# Patient Record
Sex: Female | Born: 1960 | Race: Black or African American | Hispanic: No | Marital: Married | Smoking: Former smoker
Health system: Southern US, Community
[De-identification: ages and names within clinical notes are randomized; demographics above are authoritative.]

## PROBLEM LIST (undated history)

## (undated) DIAGNOSIS — Z923 Personal history of irradiation: Secondary | ICD-10-CM

## (undated) DIAGNOSIS — I2510 Atherosclerotic heart disease of native coronary artery without angina pectoris: Secondary | ICD-10-CM

## (undated) DIAGNOSIS — I255 Ischemic cardiomyopathy: Secondary | ICD-10-CM

## (undated) DIAGNOSIS — C539 Malignant neoplasm of cervix uteri, unspecified: Secondary | ICD-10-CM

## (undated) DIAGNOSIS — Z95810 Presence of automatic (implantable) cardiac defibrillator: Secondary | ICD-10-CM

## (undated) DIAGNOSIS — I5042 Chronic combined systolic (congestive) and diastolic (congestive) heart failure: Secondary | ICD-10-CM

## (undated) DIAGNOSIS — M1990 Unspecified osteoarthritis, unspecified site: Secondary | ICD-10-CM

## (undated) DIAGNOSIS — I059 Rheumatic mitral valve disease, unspecified: Secondary | ICD-10-CM

## (undated) DIAGNOSIS — R918 Other nonspecific abnormal finding of lung field: Secondary | ICD-10-CM

## (undated) DIAGNOSIS — I10 Essential (primary) hypertension: Secondary | ICD-10-CM

## (undated) DIAGNOSIS — I2109 ST elevation (STEMI) myocardial infarction involving other coronary artery of anterior wall: Secondary | ICD-10-CM

## (undated) DIAGNOSIS — C3411 Malignant neoplasm of upper lobe, right bronchus or lung: Secondary | ICD-10-CM

## (undated) HISTORY — DX: Unspecified osteoarthritis, unspecified site: M19.90

## (undated) HISTORY — DX: Chronic combined systolic (congestive) and diastolic (congestive) heart failure: I50.42

## (undated) HISTORY — DX: Essential (primary) hypertension: I10

## (undated) HISTORY — DX: ST elevation (STEMI) myocardial infarction involving other coronary artery of anterior wall: I21.09

## (undated) HISTORY — DX: Malignant neoplasm of cervix uteri, unspecified: C53.9

## (undated) HISTORY — PX: BREAST EXCISIONAL BIOPSY: SUR124

## (undated) HISTORY — PX: PORTA CATH INSERTION: CATH118285

## (undated) HISTORY — PX: VALVE REPLACEMENT: SUR13

## (undated) HISTORY — DX: Rheumatic mitral valve disease, unspecified: I05.9

## (undated) HISTORY — DX: Ischemic cardiomyopathy: I25.5

## (undated) HISTORY — DX: Atherosclerotic heart disease of native coronary artery without angina pectoris: I25.10

## (undated) HISTORY — DX: Other nonspecific abnormal finding of lung field: R91.8

## (undated) HISTORY — PX: CARDIAC DEFIBRILLATOR PLACEMENT: SHX171

## (undated) HISTORY — PX: SHOULDER SURGERY: SHX246

## (undated) HISTORY — PX: REPLACEMENT TOTAL KNEE: SUR1224

## (undated) HISTORY — PX: CARDIAC CATHETERIZATION: SHX172

---

## 1996-08-30 HISTORY — PX: APPENDECTOMY: SHX54

## 1996-08-30 HISTORY — PX: RADICAL HYSTERECTOMY: SHX2283

## 2016-12-06 ENCOUNTER — Encounter: Payer: Self-pay | Admitting: *Deleted

## 2016-12-06 ENCOUNTER — Other Ambulatory Visit: Payer: Self-pay | Admitting: *Deleted

## 2016-12-07 ENCOUNTER — Ambulatory Visit (INDEPENDENT_AMBULATORY_CARE_PROVIDER_SITE_OTHER): Payer: BLUE CROSS/BLUE SHIELD | Admitting: Internal Medicine

## 2016-12-07 ENCOUNTER — Encounter: Payer: Self-pay | Admitting: Internal Medicine

## 2016-12-07 VITALS — BP 116/60 | HR 72 | Ht 62.0 in | Wt 215.8 lb

## 2016-12-07 DIAGNOSIS — Z95810 Presence of automatic (implantable) cardiac defibrillator: Secondary | ICD-10-CM

## 2016-12-07 DIAGNOSIS — I5022 Chronic systolic (congestive) heart failure: Secondary | ICD-10-CM

## 2016-12-07 DIAGNOSIS — I429 Cardiomyopathy, unspecified: Secondary | ICD-10-CM | POA: Diagnosis not present

## 2016-12-07 LAB — CUP PACEART INCLINIC DEVICE CHECK
Battery Voltage: 3.03 V
Brady Statistic RV Percent Paced: 0 %
Date Time Interrogation Session: 20180410152911
HighPow Impedance: 81 Ohm
Implantable Lead Location: 753860
Implantable Pulse Generator Implant Date: 20160802
Lead Channel Impedance Value: 323 Ohm
Lead Channel Impedance Value: 399 Ohm
Lead Channel Pacing Threshold Pulse Width: 0.4 ms
Lead Channel Setting Pacing Amplitude: 2 V
MDC IDC LEAD IMPLANT DT: 20160802
MDC IDC MSMT BATTERY REMAINING LONGEVITY: 128 mo
MDC IDC MSMT LEADCHNL RV PACING THRESHOLD AMPLITUDE: 0.75 V
MDC IDC MSMT LEADCHNL RV SENSING INTR AMPL: 13.625 mV
MDC IDC SET LEADCHNL RV PACING PULSEWIDTH: 0.4 ms
MDC IDC SET LEADCHNL RV SENSING SENSITIVITY: 0.3 mV

## 2016-12-07 MED ORDER — SACUBITRIL-VALSARTAN 24-26 MG PO TABS: 1.0000 | ORAL_TABLET | Freq: Two times a day (BID) | ORAL | 6 refills | Status: DC

## 2016-12-07 MED ORDER — SACUBITRIL-VALSARTAN 24-26 MG PO TABS: 1.0000 | ORAL_TABLET | Freq: Two times a day (BID) | ORAL | 0 refills | Status: DC

## 2016-12-07 MED ORDER — SACUBITRIL-VALSARTAN 24-26 MG PO TABS: 1.0000 | ORAL_TABLET | Freq: Two times a day (BID) | ORAL | Status: DC

## 2016-12-07 MED ORDER — METOPROLOL SUCCINATE ER 25 MG PO TB24: 25.0000 mg | ORAL_TABLET | Freq: Every day | ORAL | 3 refills | Status: DC

## 2016-12-07 NOTE — Patient Instructions (Signed)
Medication Instructions: - Your physician has recommended you make the following change in your medication:  1) Decrease toprol (metoprolol succinate) 50 mg- take 1/2 tablet (25 mg) by mouth once daily 2) Decrease aldactone (spironolactone) 25 mg- take 1/2 tablet (12.5 mg) by mouth once daily 3) Start entresto 24/26 mg- take one tablet by mouth twice daily  Labwork: - none ordered  Procedures/Testing: - .none ordered  Follow-Up: - You have been referred to : Darylene Price, NP in the CHF clinic  - Your physician recommends that you schedule a follow-up appointment in: 3 months with Dr. Saunders Revel  - Remote monitoring is used to monitor your Pacemaker of ICD from home. This monitoring reduces the number of office visits required to check your device to one time per year. It allows [place] to keep an eye on the functioning of your device to ensure it is working properly. You are scheduled for a device check from home on 03/08/17. You may send your transmission at any time that day. If you have a wireless device, the transmission will be sent automatically. After your physician reviews your transmission, you will receive a postcard with your next transmission date.  - Your physician wants you to follow-up in: 6 months with Dr. Caryl Comes. You will receive a reminder letter in the mail two months in advance. If you don't receive a letter, please call our office to schedule the follow-up appointment.   Any Additional Special Instructions Will Be Listed Below (If Applicable).     If you need a refill on your cardiac medications before your next appointment, please call your pharmacy.

## 2016-12-07 NOTE — Progress Notes (Signed)
ELECTROPHYSIOLOGY CONSULT NOTE  Patient ID: Vanessa Carlson, MRN: [id number], DOB/AGE: [birth year] 56 y.o. Admit date: (Not on file) Date of Consult: 12/07/2016  Primary Physician: [facility] Primary Cardiologist: new   [name] is being seen today for the evaluation of ICD  And cardiac careat the request of  [place]*.   HPI Vanessa Carlson is a 56 y.o. female  Seen to establish ICD follow-up.  She has a complicated cardiac history. 2006 she underwent mitral valve replacement-bioprosthetic.3/16 she had an anterior wall MI; they were unable to open the LAD at the [facility] and she suffered cardiogenic shock and pericarditis.  she received an ICD Medtronic  DATE TEST    3/16    echo   EF 35 %   3/17    echo   EF 25 %          SHe has had no ICD shocks. She's had no syncope.  She has some peripheral edema. She has no nocturnal dyspnea or orthopnea. She has mild dyspnea on exertion but is able to climb a flight of stairs.    Past Medical History:  Diagnosis Date  . Cervical cancer ([place])   . Chronic atrial fibrillation ([place])   . Coronary artery disease   . Essential hypertension   . MI (myocardial infarction)       Surgical History:  Past Surgical History:  Procedure Laterality Date  . CARDIAC CATHETERIZATION    . CARDIAC DEFIBRILLATOR PLACEMENT    . CORONARY ARTERY BYPASS GRAFT    . RADICAL HYSTERECTOMY    . SHOULDER SURGERY Bilateral   . VALVE REPLACEMENT Left      Home Meds: Prior to Admission medications   Medication Sig Start Date End Date Taking? Authorizing Provider  aspirin 81 MG chewable tablet Chew 81 mg by mouth daily.   Yes Historical Provider, MD  atorvastatin (LIPITOR) 80 MG tablet Take 80 mg by mouth daily.   Yes Historical Provider, MD  fluticasone (FLONASE) 50 MCG/ACT nasal spray Place 2 sprays into both nostrils as needed for allergies or rhinitis.   Yes Historical Provider, MD    furosemide (LASIX) 40 MG tablet Take 40 mg by mouth daily.   Yes Historical Provider, MD  lisinopril (PRINIVIL,ZESTRIL) 2.5 MG tablet Take 2.5 mg by mouth daily.   Yes Historical Provider, MD  metoprolol succinate (TOPROL-XL) 50 MG 24 hr tablet Take 50 mg by mouth daily. Take with or immediately following a meal.   Yes Historical Provider, MD  spironolactone (ALDACTONE) 25 MG tablet Take 25 mg by mouth daily.   Yes Historical Provider, MD  traMADol (ULTRAM) 50 MG tablet Take 50 mg by mouth every 6 (six) hours as needed.   Yes Historical Provider, MD  warfarin (COUMADIN) 1 MG tablet as directed.    Yes Historical Provider, MD  warfarin (COUMADIN) 2.5 MG tablet Take 2 by mouth Wed, Thurs, Sat, Mon. + 0.5 mg On Tues, Fri, Sun.   Yes Historical Provider, MD    Allergies:  Allergies  Allergen Reactions  . No Allergies On File     Social History   Social History  . Marital status: Married    Spouse name: N/A  . Number of children: N/A  . Years of education: N/A   Occupational History  . Not on file.   Social History Main Topics  . Smoking status: Never Smoker  . Smokeless tobacco: Never Used  . Alcohol use No  .  Drug use: No  . Sexual activity: Not on file   Other Topics Concern  . Not on file   Social History Narrative  . No narrative on file     History reviewed. No pertinent family history.   ROS:  Please see the history of present illness.     All other systems reviewed and negative.    Physical Exam: Blood pressure 116/60, pulse 72, height '5\' 2"'$  (1.575 m), weight 215 lb 12 oz (97.9 kg). General: Well developed, well nourished female in no acute distress. Head: Normocephalic, atraumatic, sclera non-icteric, no xanthomas, nares are without discharge. EENT: normal  Lymph Nodes:  none Neck: Negative for carotid bruits. JVD 8 Device pocket well healed; without hematoma or erythema.  There is no tethering  Back:without scoliosis kyphosis Lungs: Clear bilaterally to  auscultation without wheezes, rales, or rhonchi. Breathing is unlabored. Heart: RRR with S1 S2. No  /6 systolic murmur . No rubs, or gallops appreciated. Abdomen: Soft, non-tender, non-distended with normoactive bowel sounds. No hepatomegaly. No rebound/guarding. No obvious abdominal masses. Msk:  Strength and tone appear normal for age. Extremities: No clubbing or cyanosis NO  edema.  Distal pedal pulses are 2+ and equal bilaterally. Skin: Warm and Dry Neuro: Alert and oriented X 3. CN III-XII intact Grossly normal sensory and motor function . Psych:  Responds to questions appropriately with a normal affect.      Labs: Cardiac Enzymes No results for input(s): CKTOTAL, CKMB, TROPONINI in the last 72 hours. CBC No results found for: WBC, HGB, HCT, MCV, PLT PROTIME: No results for input(s): LABPROT, INR in the last 72 hours. Chemistry No results for input(s): NA, K, CL, CO2, BUN, CREATININE, CALCIUM, PROT, BILITOT, ALKPHOS, ALT, AST, GLUCOSE in the last 168 hours.  Invalid input(s): LABALBU Lipids No results found for: CHOL, HDL, LDLCALC, TRIG BNP No results found for: PROBNP Thyroid Function Tests: No results for input(s): TSH, T4TOTAL, T3FREE, THYROIDAB in the last 72 hours.  Invalid input(s): FREET3 Miscellaneous No results found for: DDIMER  Radiology/Studies:  No results found.  WNU:UVOZD rhythm at 72 Intervals 16/08/39   Assessment and Plan:  Ischemic cardiomyopathy status post anterior wall MI  Mitral valve replacement-mechanical  Implantable defibrillator-Medtronic  Congestive heart failure-class IIb   Patient has congestive heart failure in the context of her ischemic cardiomyopathy. The doctors at [place] had raised the possibility of switching to [place]. We'll try to do that today. We will decrease her Aldactone from 25--12.5 metoprolol from 50--25 in the hopes of being able to have sufficient blood pressure to initiate Entresto 20/26.  We will arrange  follow-up in the heart failure clinic  He is currently taking her diuretics 40 mg 2-3 times a day without significant diuresis. I suggested that she try 80 once a day and if augmented diuretics are necessary to take 120 mg once a day or 80 mg twice a day  I will arrange establishing with Dr. Saunders Revel for general cardiology.  We have reviewed ICD protocols and we will arrange for the transmission of her [facility] to [place]      Virl Axe

## 2016-12-07 NOTE — Progress Notes (Signed)
Referred to ICM clinic by Levander Campion, Device RN during office visit with Dr Caryl Comes.  ICM intro given and patient agreed to monthly ICM follow up.  She reported having some ankle swelling today and will discuss with Dr Caryl Comes.  She takes extra Furosemide when needed.  1st ICM remote transmission scheduled for 12/21/2016.  Provided ICM direct number given and encouraged to call for fluid symptoms.

## 2016-12-22 ENCOUNTER — Telehealth: Payer: Self-pay | Admitting: Cardiology

## 2016-12-22 NOTE — Telephone Encounter (Signed)
Spoke with pt and reminded pt of remote transmission that is due today. Pt verbalized understanding.   

## 2016-12-24 ENCOUNTER — Telehealth: Payer: Self-pay

## 2016-12-24 NOTE — Telephone Encounter (Signed)
Spoke with Vanessa Carlson in device clinic at [place] at [place] and requested patient be released in [place] since [facility] device clinic will be following her.  She released her and now visible in [facility].

## 2016-12-24 NOTE — Telephone Encounter (Signed)
ICM call to patient.  Advised I scheduled 1st ICM remote transmission for 01/06/2017 and she was just released to [place] from [place] at [place] so we can follow her in the device clinic.  She stated she is doing well at this time.  She works and sometimes has swollen feet but they usually go down at night after resting.  Confirmed she has ICM direct number and encouraged to call if needed.

## 2016-12-31 ENCOUNTER — Encounter: Payer: Self-pay | Admitting: Family

## 2016-12-31 ENCOUNTER — Ambulatory Visit: Payer: BLUE CROSS/BLUE SHIELD | Attending: Family | Admitting: Family

## 2016-12-31 VITALS — BP 126/60 | HR 82 | Resp 20 | Ht 62.0 in | Wt 210.5 lb

## 2016-12-31 DIAGNOSIS — I5022 Chronic systolic (congestive) heart failure: Secondary | ICD-10-CM | POA: Insufficient documentation

## 2016-12-31 DIAGNOSIS — I480 Paroxysmal atrial fibrillation: Secondary | ICD-10-CM

## 2016-12-31 DIAGNOSIS — I10 Essential (primary) hypertension: Secondary | ICD-10-CM

## 2016-12-31 DIAGNOSIS — I482 Chronic atrial fibrillation: Secondary | ICD-10-CM | POA: Diagnosis not present

## 2016-12-31 DIAGNOSIS — I2510 Atherosclerotic heart disease of native coronary artery without angina pectoris: Secondary | ICD-10-CM | POA: Diagnosis not present

## 2016-12-31 DIAGNOSIS — I252 Old myocardial infarction: Secondary | ICD-10-CM | POA: Diagnosis not present

## 2016-12-31 DIAGNOSIS — Z7901 Long term (current) use of anticoagulants: Secondary | ICD-10-CM | POA: Diagnosis not present

## 2016-12-31 DIAGNOSIS — Z95810 Presence of automatic (implantable) cardiac defibrillator: Secondary | ICD-10-CM | POA: Insufficient documentation

## 2016-12-31 DIAGNOSIS — Z952 Presence of prosthetic heart valve: Secondary | ICD-10-CM | POA: Insufficient documentation

## 2016-12-31 DIAGNOSIS — Z951 Presence of aortocoronary bypass graft: Secondary | ICD-10-CM | POA: Diagnosis not present

## 2016-12-31 DIAGNOSIS — I110 Hypertensive heart disease with heart failure: Secondary | ICD-10-CM | POA: Insufficient documentation

## 2016-12-31 DIAGNOSIS — Z7982 Long term (current) use of aspirin: Secondary | ICD-10-CM | POA: Insufficient documentation

## 2016-12-31 DIAGNOSIS — Z8541 Personal history of malignant neoplasm of cervix uteri: Secondary | ICD-10-CM | POA: Insufficient documentation

## 2016-12-31 LAB — BASIC METABOLIC PANEL
Anion gap: 11 (ref 5–15)
BUN: 11 mg/dL (ref 6–20)
CHLORIDE: 103 mmol/L (ref 101–111)
CO2: 28 mmol/L (ref 22–32)
CREATININE: 0.88 mg/dL (ref 0.44–1.00)
Calcium: 9.2 mg/dL (ref 8.9–10.3)
GFR calc non Af Amer: >60 mL/min (ref >60)
Glucose, Bld: 111 mg/dL — ABNORMAL HIGH (ref 65–99)
POTASSIUM: 3.2 mmol/L — AB (ref 3.5–5.1)
Sodium: 142 mmol/L (ref 135–145)

## 2016-12-31 NOTE — Patient Instructions (Addendum)
Continue weighing daily and call for an overnight weight gain of > 2 pounds or a weekly weight gain of >5 pounds.  Maintain fluid intake of 40-50 oz per day.

## 2016-12-31 NOTE — Progress Notes (Signed)
Patient ID: Vanessa Carlson, female    DOB: 17-Jun-1961, 56 y.o.   MRN: [id number]  HPI  [name] is a 56 y/o female with a history of cervical cancer, atrial fibrillation, CAD, HTN, MI, AICD and chronic heart failure.   Reviewed last echo report done on 11/20/15 which showed an EF of 20-25%.  Was admitted in 2006 to [facility] for mitral valve replacement. Admitted March 2016 due to anterior wall MI and received ICD.   She presents today for her initial visit with a chief complaint of mild shortness of breath with moderate exertion. She describes this as chronic in nature and has been present for a few years. Improves with rest. She has associated fatigue and edema with this.   Past Medical History:  Diagnosis Date  . Cervical cancer ([place])   . CHF (congestive heart failure) ([place])   . Chronic atrial fibrillation ([place])   . Coronary artery disease   . Essential hypertension   . MI (myocardial infarction) [facility])    Past Surgical History:  Procedure Laterality Date  . CARDIAC CATHETERIZATION    . CARDIAC DEFIBRILLATOR PLACEMENT    . CORONARY ARTERY BYPASS GRAFT    . RADICAL HYSTERECTOMY    . SHOULDER SURGERY Bilateral   . VALVE REPLACEMENT Left    No family history on file. Social History  Substance Use Topics  . Smoking status: Never Smoker  . Smokeless tobacco: Never Used  . Alcohol use No   Allergies  Allergen Reactions  . No Allergies On File    Prior to Admission medications   Medication Sig Start Date End Date Taking? Authorizing Provider  aspirin 81 MG chewable tablet Chew 81 mg by mouth daily.   Yes [provider]  atorvastatin (LIPITOR) 80 MG tablet Take 80 mg by mouth daily.   Yes [provider]  fluticasone (FLONASE) 50 MCG/ACT nasal spray Place 2 sprays into both nostrils as needed for allergies or rhinitis.   Yes [provider]  furosemide (LASIX) 40 MG tablet Take 40 mg by mouth 3 (three) times daily.    Yes [provider]   metoprolol succinate (TOPROL-XL) 25 MG 24 hr tablet Take 1 tablet (25 mg total) by mouth daily. 12/07/16  Yes Deboraha Sprang, MD  sacubitril-valsartan (ENTRESTO) 24-26 MG Take 1 tablet by mouth 2 (two) times daily. 12/07/16  Yes Deboraha Sprang, MD  spironolactone (ALDACTONE) 25 MG tablet Take 1/2 tablet (12.5 mg) by mouth once daily   Yes [provider]  traMADol (ULTRAM) 50 MG tablet Take 50 mg by mouth every 6 (six) hours as needed.   Yes [provider]  warfarin (COUMADIN) 1 MG tablet as directed.    Yes [provider]  warfarin (COUMADIN) 2.5 MG tablet Take 2 by mouth Wed, Thurs, Sat, Mon. + 0.5 mg On Tues, Fri, Sun.   Yes [provider]     Review of Systems  Constitutional: Positive for fatigue (mild). Negative for appetite change.  HENT: Negative for congestion, postnasal drip and sore throat.   Eyes: Negative.   Respiratory: Positive for shortness of breath. Negative for cough and chest tightness.   Cardiovascular: Positive for leg swelling (at the end of the work day; resolves with elevation). Negative for chest pain and palpitations.  Gastrointestinal: Negative for abdominal distention and abdominal pain.  Endocrine: Negative.   Genitourinary: Negative.   Musculoskeletal: Negative for back pain and neck pain.  Skin: Negative.   Allergic/Immunologic: Negative.  Neurological: Negative for dizziness and light-headedness.  Hematological: Negative for adenopathy. Does not bruise/bleed easily.  Psychiatric/Behavioral: Negative for dysphoric mood, sleep disturbance (sleeping on 2 pillows) and suicidal ideas. The patient is not nervous/anxious.    Vitals:   12/31/16 1053  BP: 126/60  Pulse: 82  Resp: 20  SpO2: 99%  Weight: 210 lb 8 oz (95.5 kg)  Height: '5\' 2"'$  (1.575 m)   Wt Readings from Last 3 Encounters:  12/31/16 210 lb 8 oz (95.5 kg)  12/07/16 215 lb 12 oz (97.9 kg)   Physical Exam  Constitutional: She is oriented to person,  place, and time. She appears well-developed and well-nourished.  HENT:  Head: Normocephalic and atraumatic.  Neck: Normal range of motion. Neck supple. No JVD present.  Cardiovascular: Normal rate and regular rhythm.   Pulmonary/Chest: Effort normal. She has no wheezes. She has no rales.  Abdominal: Soft. She exhibits no distension. There is no tenderness.  Musculoskeletal: She exhibits no edema or tenderness.  Neurological: She is alert and oriented to person, place, and time.  Skin: Skin is warm and dry.  Psychiatric: She has a normal mood and affect. Her behavior is normal.  Nursing note and vitals reviewed.   Assessment & Plan:  1: Chronic heart failure with reduced ejection fraction- - NYHA class II - euvolemic today - weighing daily. Instructed to call for an overnight weight gain of >2 pounds or a weekly weight gain of >5 pounds - taking furosemide '80mg'$  daily and sometimes BID - entresto has been started and tolerating without known side effects - get a BMP today - discussed trying to titrate up entresto at future visits - need updated echo at some point. May be good to wait until she has been on entresto for a few months - sees cardiologist (End) 02/23/17  2: HTN- - BP looks good - follows with [facility]  3: Atrial fibrillation- - AICD present but denies shocks - follows with EP Caryl Comes) and was last seen by him 12/07/16  Return here in 1 month or sooner for any questions/problems before then.

## 2017-01-02 DIAGNOSIS — I10 Essential (primary) hypertension: Secondary | ICD-10-CM | POA: Insufficient documentation

## 2017-01-02 DIAGNOSIS — I5022 Chronic systolic (congestive) heart failure: Secondary | ICD-10-CM | POA: Insufficient documentation

## 2017-01-02 DIAGNOSIS — I4891 Unspecified atrial fibrillation: Secondary | ICD-10-CM | POA: Insufficient documentation

## 2017-01-02 DIAGNOSIS — I5023 Acute on chronic systolic (congestive) heart failure: Secondary | ICD-10-CM | POA: Insufficient documentation

## 2017-01-03 ENCOUNTER — Telehealth: Payer: Self-pay | Admitting: Family

## 2017-01-03 NOTE — Telephone Encounter (Signed)
Spoke with patient about lab work that was drawn on 12/31/16. Renal function looks good although potassium level is slightly low at 3.2.   She is currently taking '40mg'$  furosemide AM and '80mg'$  PM, spironolactone 12.'5mg'$  daily and entresto 24/'26mg'$  twice daily.  Discussed options of adding either potassium supplement or going back up to '25mg'$  spironolactone daily. It had been decreased along with toprol xl due to hypotension.  Patient would like to increase her spironolactone back to a whole tablet (25 mg) daily as she really doesn't want to swallow a large potassium pill. She will check her blood pressure a few times a week and call if it starts to decline.  Will recheck her lab work at her next visit. Patient is comfortable with this plan.

## 2017-01-06 ENCOUNTER — Telehealth: Payer: Self-pay

## 2017-01-06 ENCOUNTER — Ambulatory Visit (INDEPENDENT_AMBULATORY_CARE_PROVIDER_SITE_OTHER): Payer: BLUE CROSS/BLUE SHIELD

## 2017-01-06 DIAGNOSIS — I5022 Chronic systolic (congestive) heart failure: Secondary | ICD-10-CM

## 2017-01-06 DIAGNOSIS — Z95810 Presence of automatic (implantable) cardiac defibrillator: Secondary | ICD-10-CM | POA: Diagnosis not present

## 2017-01-06 NOTE — Progress Notes (Signed)
EPIC Encounter for ICM Monitoring  Patient Name: Vanessa Carlson is a 56 y.o. female Date: 01/06/2017 Primary Care Physican: [facility] [place] Primary Cardiologist: Darylene Price, NP HF Electrophysiologist: Caryl Comes Dry Weight:  unknown       Attempted call to patient and unable to reach.   Transmission reviewed.    Thoracic impedance abnormal suggesting fluid accumulation but starting to trend back toward baseline.  Prescribed dosage: Furosemide 40 mg 1 tablet 3 times daily.    Labs: 12/31/2016 Creatinine 0.88, BUN 11, Potassium 3.2, Sodium 142, EGFR >60  Recommendations: NONE - Unable to reach patient   Follow-up plan: ICM clinic phone appointment on 01/13/2017 to recheck fluid levels.    Copy of ICM check sent to device physician.   3 month ICM trend: 01/06/2017   1 Year ICM trend:  Per Carelink recording, it is having problems generating reports.     [name], RN 01/06/2017 11:19 AM

## 2017-01-06 NOTE — Telephone Encounter (Signed)
Remote ICM transmission received.  Attempted patient call and no answer or voice mail.

## 2017-01-13 ENCOUNTER — Telehealth: Payer: Self-pay | Admitting: Cardiology

## 2017-01-13 NOTE — Telephone Encounter (Signed)
LMOVM reminding pt to send remote transmission.   

## 2017-01-14 NOTE — Progress Notes (Signed)
No ICM remote transmission received for 01/13/2017 and next ICM transmission scheduled for 02/07/2017.

## 2017-01-28 ENCOUNTER — Encounter: Payer: Self-pay | Admitting: Family

## 2017-01-28 ENCOUNTER — Ambulatory Visit: Payer: BLUE CROSS/BLUE SHIELD | Attending: Family | Admitting: Family

## 2017-01-28 ENCOUNTER — Telehealth: Payer: Self-pay | Admitting: Family

## 2017-01-28 VITALS — BP 117/55 | HR 85 | Resp 20 | Ht 62.0 in | Wt 214.5 lb

## 2017-01-28 DIAGNOSIS — Z95810 Presence of automatic (implantable) cardiac defibrillator: Secondary | ICD-10-CM | POA: Insufficient documentation

## 2017-01-28 DIAGNOSIS — Z952 Presence of prosthetic heart valve: Secondary | ICD-10-CM | POA: Diagnosis not present

## 2017-01-28 DIAGNOSIS — Z8541 Personal history of malignant neoplasm of cervix uteri: Secondary | ICD-10-CM | POA: Insufficient documentation

## 2017-01-28 DIAGNOSIS — E876 Hypokalemia: Secondary | ICD-10-CM | POA: Diagnosis not present

## 2017-01-28 DIAGNOSIS — I252 Old myocardial infarction: Secondary | ICD-10-CM | POA: Insufficient documentation

## 2017-01-28 DIAGNOSIS — Z951 Presence of aortocoronary bypass graft: Secondary | ICD-10-CM | POA: Diagnosis not present

## 2017-01-28 DIAGNOSIS — I480 Paroxysmal atrial fibrillation: Secondary | ICD-10-CM

## 2017-01-28 DIAGNOSIS — I110 Hypertensive heart disease with heart failure: Secondary | ICD-10-CM | POA: Diagnosis not present

## 2017-01-28 DIAGNOSIS — I482 Chronic atrial fibrillation: Secondary | ICD-10-CM | POA: Diagnosis not present

## 2017-01-28 DIAGNOSIS — I5022 Chronic systolic (congestive) heart failure: Secondary | ICD-10-CM | POA: Diagnosis present

## 2017-01-28 DIAGNOSIS — Z7902 Long term (current) use of antithrombotics/antiplatelets: Secondary | ICD-10-CM | POA: Diagnosis not present

## 2017-01-28 DIAGNOSIS — I2510 Atherosclerotic heart disease of native coronary artery without angina pectoris: Secondary | ICD-10-CM | POA: Diagnosis not present

## 2017-01-28 DIAGNOSIS — Z90710 Acquired absence of both cervix and uterus: Secondary | ICD-10-CM | POA: Insufficient documentation

## 2017-01-28 DIAGNOSIS — Z7982 Long term (current) use of aspirin: Secondary | ICD-10-CM | POA: Diagnosis not present

## 2017-01-28 DIAGNOSIS — I10 Essential (primary) hypertension: Secondary | ICD-10-CM

## 2017-01-28 LAB — BASIC METABOLIC PANEL
ANION GAP: 7 (ref 5–15)
BUN: 10 mg/dL (ref 6–20)
CALCIUM: 9.2 mg/dL (ref 8.9–10.3)
CHLORIDE: 104 mmol/L (ref 101–111)
CO2: 28 mmol/L (ref 22–32)
Creatinine, Ser: 0.89 mg/dL (ref 0.44–1.00)
GFR calc Af Amer: >60 mL/min (ref >60)
GFR calc non Af Amer: >60 mL/min (ref >60)
GLUCOSE: 105 mg/dL — AB (ref 65–99)
Potassium: 3.6 mmol/L (ref 3.5–5.1)
Sodium: 139 mmol/L (ref 135–145)

## 2017-01-28 MED ORDER — POTASSIUM CHLORIDE ER 10 MEQ PO TBCR: 10.0000 meq | EXTENDED_RELEASE_TABLET | Freq: Every day | ORAL | 3 refills | Status: DC

## 2017-01-28 NOTE — Progress Notes (Signed)
Patient ID: Vanessa Carlson, female    DOB: [birth year], 56 y.o.   MRN: [id number]  HPI  [name] is a 56 y/o female with a history of cervical cancer, atrial fibrillation, CAD, HTN, MI, AICD and chronic heart failure.   Reviewed last echo report done on 11/20/15 which showed an EF of 20-25%.  Was admitted in 2006 to [facility] for mitral valve replacement. Admitted March 2016 due to anterior wall MI and received ICD.   She presents today for her follow-up visit with a chief complaint of nausea and vomiting. She says that this has been present ever since her spironolactone was increase to 25mg  daily. It has gradually worsened over the last couple of weeks as well. She has associated abdominal pain, back pain, shortness of breath and edema along with this.  Past Medical History:  Diagnosis Date  . Cervical cancer ([place])   . CHF (congestive heart failure) ([place])   . Chronic atrial fibrillation ([place])   . Coronary artery disease   . Essential hypertension   . MI (myocardial infarction) [facility])    Past Surgical History:  Procedure Laterality Date  . CARDIAC CATHETERIZATION    . CARDIAC DEFIBRILLATOR PLACEMENT    . CORONARY ARTERY BYPASS GRAFT    . RADICAL HYSTERECTOMY    . SHOULDER SURGERY Bilateral   . VALVE REPLACEMENT Left    No family history on file. Social History  Substance Use Topics  . Smoking status: Never Smoker  . Smokeless tobacco: Never Used  . Alcohol use No   Allergies  Allergen Reactions  . No Allergies On File    Prior to Admission medications   Medication Sig Start Date End Date Taking? Authorizing Provider  aspirin 81 MG chewable tablet Chew 81 mg by mouth daily.   Yes [provider]  atorvastatin (LIPITOR) 80 MG tablet Take 80 mg by mouth daily.   Yes [provider]  fluticasone (FLONASE) 50 MCG/ACT nasal spray Place 2 sprays into both nostrils as needed for allergies or rhinitis.   Yes [provider]  furosemide (LASIX) 40 MG  tablet Take 40 mg by mouth 3 (three) times daily.    Yes [provider]  metoprolol succinate (TOPROL-XL) 25 MG 24 hr tablet Take 1 tablet (25 mg total) by mouth daily. Patient taking differently: Take 12.5 mg by mouth daily.  12/07/16  Yes Deboraha Sprang, MD  sacubitril-valsartan (ENTRESTO) 24-26 MG Take 1 tablet by mouth 2 (two) times daily. 12/07/16  Yes Deboraha Sprang, MD  spironolactone (ALDACTONE) 25 MG tablet Take 25 mg by mouth daily.    Yes [provider]  traMADol (ULTRAM) 50 MG tablet Take 50 mg by mouth every 6 (six) hours as needed.   Yes [provider]  warfarin (COUMADIN) 1 MG tablet as directed.    Yes [provider]  warfarin (COUMADIN) 2.5 MG tablet Take 2 by mouth Wed, Thurs, Sat, Mon. + 0.5 mg On Tues, Fri, Sun.   Yes [provider]    Review of Systems  Constitutional: Positive for fatigue (mild). Negative for appetite change.  HENT: Negative for congestion, postnasal drip and sore throat.   Eyes: Negative.   Respiratory: Positive for shortness of breath. Negative for cough and chest tightness.   Cardiovascular: Positive for chest pain (1 episode a couple of nights ago/ 1 SL NTG relieved ) and leg swelling (at the end of the work day; resolves with elevation). Negative for palpitations.  Gastrointestinal: Positive  for abdominal pain, nausea and vomiting. Negative for abdominal distention.  Endocrine: Negative.   Genitourinary: Negative.   Musculoskeletal: Positive for back pain. Negative for neck pain.  Skin: Negative.   Allergic/Immunologic: Negative.   Neurological: Negative for dizziness and light-headedness.  Hematological: Negative for adenopathy. Does not bruise/bleed easily.  Psychiatric/Behavioral: Negative for dysphoric mood, sleep disturbance (sleeping on 2 pillows) and suicidal ideas. The patient is not nervous/anxious.    Vitals:   01/28/17 1039  BP: (!) 117/55  Pulse: 85  Resp: 20  SpO2: 100%  Weight:  214 lb 8 oz (97.3 kg)  Height: 5\' 2"  (1.575 m)   Wt Readings from Last 3 Encounters:  01/28/17 214 lb 8 oz (97.3 kg)  12/31/16 210 lb 8 oz (95.5 kg)  12/07/16 215 lb 12 oz (97.9 kg)   Lab Results  Component Value Date   CREATININE 0.89 01/28/2017   CREATININE 0.88 12/31/2016    Physical Exam  Constitutional: She is oriented to person, place, and time. She appears well-developed and well-nourished.  HENT:  Head: Normocephalic and atraumatic.  Neck: Normal range of motion. Neck supple. No JVD present.  Cardiovascular: Normal rate and regular rhythm.   Pulmonary/Chest: Effort normal. She has no wheezes. She has no rales.  Abdominal: Soft. She exhibits no distension. There is no tenderness.  Musculoskeletal: She exhibits no edema or tenderness.  Neurological: She is alert and oriented to person, place, and time.  Skin: Skin is warm and dry.  Psychiatric: She has a normal mood and affect. Her behavior is normal.  Nursing note and vitals reviewed.   Assessment & Plan:  1: Chronic heart failure with reduced ejection fraction- - NYHA class II - euvolemic today - weighing daily. Instructed to call for an overnight weight gain of >2 pounds or a weekly weight gain of >5 pounds; weight up 4 pounds since she was last here - taking furosemide 40mg  AM/ 80mg  PM - discussed trying to titrate up entresto at future visits - need updated echo at some point. May be good to wait until she has been on entresto for a few months - due to symptoms since increasing her spironolactone to 25mg  daily will decrease it back to 12.5mg  daily - sees cardiologist (End) 02/23/17  2: HTN- - BP looks good - follows with [facility] and returns on 02/01/17  3: Atrial fibrillation- - AICD present but denies shocks - follows with EP Caryl Comes) and was last seen by him 12/07/16  4: Hypokalemia- - spironolactone had been increased to 25mg  daily due to potassium of 3.2 on 12/31/16 but will decrease it  back to 12.5mg  due to GI symptoms that she's having - may need potassium supplements depending on BMP results - BMP drawn today  Return here in 2 weeks or sooner for any questions/problems before then.

## 2017-01-28 NOTE — Telephone Encounter (Signed)
Reviewed lab results from today (01/28/17) with patient. Potassium has normalized but she's having symptoms from increased spironlactone dose. At visit today, she was told to decrease her spironolactone back to 1/2 tablet (12.5mg ) daily.   Based on lab results with a potassium of 3.6 and decreasing her spironolactone, will add 55meq potassium once daily.   Will recheck lab work in 2 weeks when she returns.

## 2017-01-28 NOTE — Patient Instructions (Addendum)
Continue weighing daily and call for an overnight weight gain of > 2 pounds or a weekly weight gain of >5 pounds.  Decrease spironolactone back to 1/2 tablet daily.

## 2017-02-07 ENCOUNTER — Ambulatory Visit (INDEPENDENT_AMBULATORY_CARE_PROVIDER_SITE_OTHER): Payer: BLUE CROSS/BLUE SHIELD

## 2017-02-07 DIAGNOSIS — I5022 Chronic systolic (congestive) heart failure: Secondary | ICD-10-CM | POA: Diagnosis not present

## 2017-02-07 DIAGNOSIS — Z95810 Presence of automatic (implantable) cardiac defibrillator: Secondary | ICD-10-CM

## 2017-02-07 NOTE — Progress Notes (Signed)
EPIC Encounter for ICM Monitoring  Patient Name: Vanessa Carlson is a 56 y.o. female Date: 02/07/2017 Primary Care Physican: [facility] [place] Primary Cardiologist: Darylene Price, NP HF Electrophysiologist: Caryl Comes Dry Weight:     unknown      Attempted call to patient and unable to reach.  Left message to return call.  Transmission reviewed.    Thoracic impedance abnormal suggesting fluid accumulation 01/17/2017 but close to baseline.  Prescribed dosage: Furosemide 40 mg 1 tablet 3 times daily.    Labs: 01/28/2017 Creatinine 0.89, BUN 10, Potassium 3.6, Sodium 139, EGFR >60 12/31/2016 Creatinine 0.88, BUN 11, Potassium 3.2, Sodium 142, EGFR >60  Recommendations: NONE - Unable to reach patient   Follow-up plan: ICM clinic phone appointment on 03/14/2017.  Office appointment scheduled with Dr End on 02/23/2017 (1st office visit).  Copy of ICM check sent to Dr Caryl Comes and Darylene Price, NP.   3 month ICM trend: 02/08/2017   1 Year ICM trend:      [name], RN 02/07/2017 11:07 AM

## 2017-02-08 ENCOUNTER — Ambulatory Visit: Payer: BLUE CROSS/BLUE SHIELD | Attending: Family | Admitting: Family

## 2017-02-08 ENCOUNTER — Encounter: Payer: Self-pay | Admitting: Family

## 2017-02-08 ENCOUNTER — Telehealth: Payer: Self-pay | Admitting: Family

## 2017-02-08 ENCOUNTER — Telehealth: Payer: Self-pay

## 2017-02-08 VITALS — BP 127/58 | HR 74 | Resp 20 | Ht 62.0 in | Wt 211.5 lb

## 2017-02-08 DIAGNOSIS — N3000 Acute cystitis without hematuria: Secondary | ICD-10-CM

## 2017-02-08 DIAGNOSIS — I2510 Atherosclerotic heart disease of native coronary artery without angina pectoris: Secondary | ICD-10-CM | POA: Insufficient documentation

## 2017-02-08 DIAGNOSIS — E876 Hypokalemia: Secondary | ICD-10-CM | POA: Insufficient documentation

## 2017-02-08 DIAGNOSIS — I480 Paroxysmal atrial fibrillation: Secondary | ICD-10-CM

## 2017-02-08 DIAGNOSIS — I10 Essential (primary) hypertension: Secondary | ICD-10-CM

## 2017-02-08 DIAGNOSIS — I5022 Chronic systolic (congestive) heart failure: Secondary | ICD-10-CM | POA: Insufficient documentation

## 2017-02-08 DIAGNOSIS — Z87440 Personal history of urinary (tract) infections: Secondary | ICD-10-CM | POA: Insufficient documentation

## 2017-02-08 DIAGNOSIS — Z8541 Personal history of malignant neoplasm of cervix uteri: Secondary | ICD-10-CM | POA: Diagnosis not present

## 2017-02-08 DIAGNOSIS — I252 Old myocardial infarction: Secondary | ICD-10-CM | POA: Insufficient documentation

## 2017-02-08 DIAGNOSIS — I110 Hypertensive heart disease with heart failure: Secondary | ICD-10-CM | POA: Diagnosis present

## 2017-02-08 DIAGNOSIS — I4891 Unspecified atrial fibrillation: Secondary | ICD-10-CM | POA: Diagnosis not present

## 2017-02-08 LAB — BASIC METABOLIC PANEL
ANION GAP: 7 (ref 5–15)
BUN: 8 mg/dL (ref 6–20)
CHLORIDE: 102 mmol/L (ref 101–111)
CO2: 30 mmol/L (ref 22–32)
Calcium: 9 mg/dL (ref 8.9–10.3)
Creatinine, Ser: 0.87 mg/dL (ref 0.44–1.00)
GFR calc Af Amer: >60 mL/min (ref >60)
GLUCOSE: 109 mg/dL — AB (ref 65–99)
POTASSIUM: 3.2 mmol/L — AB (ref 3.5–5.1)
Sodium: 139 mmol/L (ref 135–145)

## 2017-02-08 MED ORDER — SACUBITRIL-VALSARTAN 24-26 MG PO TABS: 1.0000 | ORAL_TABLET | Freq: Two times a day (BID) | ORAL | 5 refills | Status: DC

## 2017-02-08 MED ORDER — NITROGLYCERIN 0.3 MG SL SUBL: 0.3000 mg | SUBLINGUAL_TABLET | SUBLINGUAL | 3 refills | Status: DC | PRN

## 2017-02-08 NOTE — Patient Instructions (Signed)
Continue weighing daily and call for an overnight weight gain of > 2 pounds or a weekly weight gain of >5 pounds. 

## 2017-02-08 NOTE — Telephone Encounter (Signed)
Remote ICM transmission received.  Attempted patient call and left message to return call.   

## 2017-02-08 NOTE — Telephone Encounter (Signed)
Spoke with patient regarding lab work that was drawn today (02/08/17). Renal function looks good but potassium has dropped to 3.2.   She is currently taking spironolactone 12.5mg  daily as she couldn't tolerate the 25mg  dose along with 1meq potassium daily. Had only been taking entresto once daily.  Advised patient to increase her potassium to 2 capsules (59meq total) daily and will ask her cardiologist to repeat the BMP when she sees him on 02/23/17. Also advised patient to let me know when she needs a new potassium prescription sent in. She will also begin taking the entresto twice daily as prescribed.

## 2017-02-08 NOTE — Progress Notes (Signed)
Patient ID: Vanessa Carlson, female    DOB: 07-15-1961, 56 y.o.   MRN: [id number]  HPI  Vanessa Carlson is a 56 y/o female with a history of cervical cancer, atrial fibrillation, CAD, HTN, MI, AICD and chronic heart failure.   Reviewed last echo report done on 11/20/15 which showed an EF of 20-25%.  Was admitted in 2006 to [facility] for mitral valve replacement. Admitted March 2016 due to anterior wall MI and received ICD.   She presents today for her follow-up visit with a chief complaint of shortness of breath with moderate exertion. This is accompanied by fatigue, chest pain, and leg swelling. She had one episode of chest pain on Saturday which was relieved by 1 tablet of nitroglycerin. Has been experiencing cramping and back pain which her PCP believes is related to a UTI. She was prescribed an antibiotic but unsure of which one at this time. Denies any nausea and vomiting at this time.   Past Medical History:  Diagnosis Date  . Cervical cancer ([place])   . CHF (congestive heart failure) ([place])   . Chronic atrial fibrillation ([place])   . Coronary artery disease   . Essential hypertension   . MI (myocardial infarction) [facility])    Past Surgical History:  Procedure Laterality Date  . CARDIAC CATHETERIZATION    . CARDIAC DEFIBRILLATOR PLACEMENT    . CORONARY ARTERY BYPASS GRAFT    . RADICAL HYSTERECTOMY    . SHOULDER SURGERY Bilateral   . VALVE REPLACEMENT Left    No family history on file. Social History  Substance Use Topics  . Smoking status: Never Smoker  . Smokeless tobacco: Never Used  . Alcohol use No   Allergies  Allergen Reactions  . No Allergies On File    Prior to Admission medications   Medication Sig Start Date End Date Taking? Authorizing Provider  aspirin 81 MG chewable tablet Chew 81 mg by mouth daily.   Yes [provider]  atorvastatin (LIPITOR) 80 MG tablet Take 80 mg by mouth daily.   Yes [provider]  fluticasone (FLONASE) 50 MCG/ACT nasal spray  Place 2 sprays into both nostrils as needed for allergies or rhinitis.   Yes [provider]  furosemide (LASIX) 40 MG tablet Take 40 mg by mouth 3 (three) times daily.    Yes [provider]  metoprolol succinate (TOPROL-XL) 25 MG 24 hr tablet Take 1 tablet (25 mg total) by mouth daily. Patient taking differently: Take 12.5 mg by mouth daily.  12/07/16  Yes Deboraha Sprang, MD  nitroGLYCERIN (NITROSTAT) 0.3 MG SL tablet Place 1 tablet (0.3 mg total) under the tongue every 5 (five) minutes as needed for chest pain. 02/08/17  Yes Darylene Price A, FNP  potassium chloride (K-DUR) 10 MEQ tablet Take 1 tablet (10 mEq total) by mouth daily. 01/28/17 04/28/17 Yes Hackney, Tina A, FNP  sacubitril-valsartan (ENTRESTO) 24-26 MG Take 1 tablet by mouth 2 (two) times daily. 02/08/17  Yes Hackney, Otila Kluver A, FNP  spironolactone (ALDACTONE) 25 MG tablet Take 12.5 mg by mouth daily. Take 1/2 tablet (12.5 mg) by mouth once daily   Yes [provider]  traMADol (ULTRAM) 50 MG tablet Take 50 mg by mouth every 6 (six) hours as needed.   Yes [provider]  warfarin (COUMADIN) 1 MG tablet 9 mg as directed. Take 9 mg on Tues, Thurs, Sun and 8 mg on all other days   Yes [provider]  warfarin (COUMADIN) 2.5 MG tablet  8 mg. 8 mg on Mon, Wed, Fri, and Sat 9 mg on Tues, Thurs, Sun   Yes [provider]     Review of Systems  Constitutional: Positive for fatigue (improving). Negative for appetite change.  HENT: Negative for congestion, postnasal drip and sore throat.   Eyes: Negative.   Respiratory: Positive for shortness of breath (Walking upstairs). Negative for cough and chest tightness.   Cardiovascular: Positive for chest pain (Relieved by Nitroglycerin on Saturday) and leg swelling (at the end of the work day; resolves with elevation). Negative for palpitations.  Gastrointestinal: Negative for abdominal distention and abdominal pain.  Endocrine: Negative.    Genitourinary: Negative.   Musculoskeletal: Negative for back pain and neck pain.  Skin: Negative.   Allergic/Immunologic: Negative.   Neurological: Negative for dizziness and light-headedness.  Hematological: Negative for adenopathy. Does not bruise/bleed easily.  Psychiatric/Behavioral: Negative for dysphoric mood, sleep disturbance (sleeping on 2 pillows) and suicidal ideas. The patient is not nervous/anxious.    Vitals:   02/08/17 0931  BP: (!) 127/58  Pulse: 74  Resp: 20  SpO2: 100%  Weight: 211 lb 8 oz (95.9 kg)  Height: 5\' 2"  (1.575 m)   Wt Readings from Last 3 Encounters:  02/08/17 211 lb 8 oz (95.9 kg)  01/28/17 214 lb 8 oz (97.3 kg)  12/31/16 210 lb 8 oz (95.5 kg)   Lab Results  Component Value Date   CREATININE 0.89 01/28/2017   CREATININE 0.88 12/31/2016    Physical Exam  Constitutional: She is oriented to person, place, and time. She appears well-developed and well-nourished.  HENT:  Head: Normocephalic and atraumatic.  Neck: Normal range of motion. Neck supple. No JVD present.  Cardiovascular: Normal rate and regular rhythm.   Pulmonary/Chest: Effort normal. She has no wheezes. She has no rales.  Abdominal: Soft. She exhibits no distension. There is no tenderness.  Musculoskeletal: She exhibits edema (trace in bilateral lower legs). She exhibits no tenderness.  Neurological: She is alert and oriented to person, place, and time.  Skin: Skin is warm and dry.  Psychiatric: She has a normal mood and affect. Her behavior is normal.  Nursing note and vitals reviewed.   Assessment & Plan:  1: Chronic heart failure with reduced ejection fraction- - NYHA class II - euvolemic today - not adding salt to food. Advised to follow a 2000 mg sodium diet  - weighing daily. Instructed to call for an overnight weight gain of >2 pounds or a weekly weight gain of >5 pounds; weight down 3 pounds since she was last here - taking furosemide 40mg  AM/ 80mg  PM - started on  Entresto but was only taking once a day; Instructed patient to take twice daily. If BP allows will increase Entresto at future visits - Entresto samples #56 tablets, lot [id number], exp 7/18 given to her - patient aware that she will need a new echo. May be good to wait until she has been on Entresto for a few months - taking spironolactone 12.5 mg daily  - sees cardiologist (End) on 02/23/17  2: HTN- - BP looks good today  - follows with [facility] and returns today  3: Atrial fibrillation- - heart rate controlled at this time - AICD present but denies shocks - continue warfarin and metoprolol succinate  - follows with EP Caryl Comes) and was last seen by him 12/07/16  4: Hypokalemia- - no nausea and vomiting at this time with decreased dose of spironolactone  - BMP scheduled for  today - may need further potassium supplements depending on BMP results  5: UTI-  - prescribed an antibiotic at last PCP appointment on 02/01/17 - seeing [facility] today to have INR checked with patient being on antibiotic - continues to have abdominal cramping and back pain. She will follow up with PCP about these symptoms today  Return here in 2 months or sooner for any questions/problems before then.   Loree Fee, PharmD 11:33 AM 02/08/2017

## 2017-02-15 ENCOUNTER — Other Ambulatory Visit: Payer: Self-pay

## 2017-02-15 MED ORDER — FUROSEMIDE 40 MG PO TABS: 40.0000 mg | ORAL_TABLET | Freq: Three times a day (TID) | ORAL | 3 refills | Status: DC

## 2017-02-23 ENCOUNTER — Other Ambulatory Visit: Payer: Self-pay | Admitting: *Deleted

## 2017-02-23 ENCOUNTER — Other Ambulatory Visit
Admission: RE | Admit: 2017-02-23 | Discharge: 2017-02-23 | Disposition: A | Discharge status: Home / Self Care | Payer: BLUE CROSS/BLUE SHIELD | Source: Ambulatory Visit | Attending: Internal Medicine | Admitting: Internal Medicine

## 2017-02-23 ENCOUNTER — Encounter: Payer: Self-pay | Admitting: Internal Medicine

## 2017-02-23 ENCOUNTER — Ambulatory Visit (INDEPENDENT_AMBULATORY_CARE_PROVIDER_SITE_OTHER): Payer: BLUE CROSS/BLUE SHIELD | Admitting: Internal Medicine

## 2017-02-23 VITALS — BP 110/60 | HR 78 | Ht 62.5 in | Wt 211.5 lb

## 2017-02-23 DIAGNOSIS — I255 Ischemic cardiomyopathy: Secondary | ICD-10-CM | POA: Diagnosis not present

## 2017-02-23 DIAGNOSIS — I10 Essential (primary) hypertension: Secondary | ICD-10-CM

## 2017-02-23 DIAGNOSIS — R7989 Other specified abnormal findings of blood chemistry: Secondary | ICD-10-CM

## 2017-02-23 DIAGNOSIS — I5022 Chronic systolic (congestive) heart failure: Secondary | ICD-10-CM | POA: Diagnosis not present

## 2017-02-23 DIAGNOSIS — Z952 Presence of prosthetic heart valve: Secondary | ICD-10-CM

## 2017-02-23 DIAGNOSIS — I099 Rheumatic heart disease, unspecified: Secondary | ICD-10-CM

## 2017-02-23 DIAGNOSIS — I25118 Atherosclerotic heart disease of native coronary artery with other forms of angina pectoris: Secondary | ICD-10-CM | POA: Diagnosis not present

## 2017-02-23 LAB — BASIC METABOLIC PANEL
ANION GAP: 6 (ref 5–15)
BUN: 13 mg/dL (ref 6–20)
CALCIUM: 9.1 mg/dL (ref 8.9–10.3)
CO2: 29 mmol/L (ref 22–32)
Chloride: 100 mmol/L — ABNORMAL LOW (ref 101–111)
Creatinine, Ser: 1.04 mg/dL — ABNORMAL HIGH (ref 0.44–1.00)
GFR calc Af Amer: >60 mL/min (ref >60)
GFR calc non Af Amer: 58 mL/min — ABNORMAL LOW (ref >60)
GLUCOSE: 105 mg/dL — AB (ref 65–99)
Potassium: 3.8 mmol/L (ref 3.5–5.1)
Sodium: 135 mmol/L (ref 135–145)

## 2017-02-23 MED ORDER — METOPROLOL SUCCINATE ER 50 MG PO TB24: 50.0000 mg | ORAL_TABLET | Freq: Every day | ORAL | 3 refills | Status: DC

## 2017-02-23 NOTE — Progress Notes (Signed)
New Outpatient Visit Date: 02/23/2017  Referring Provider: [facility], [facility] [place], [place] [address]  Chief Complaint: Establish cardiology care  HPI:  [name] is a 56 y.o. female who is being seen today to establish general cardiology care after having moved to [place] in January. 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 She subsequently presented with an anterior STEMI in 2016. At the time, proximal LAD occlusion could not be revascularized in the patient was treated medically with resultant cardiogenic shock and severe systolic heart failure. She underwent ICD implantation for primary prevention. She has done remarkably well since that time. She has established with the heart failure clinic here [facility] as well as with Dr. Caryl Comes of electrophysiology.  Today, [name] reports feeling relatively well. She has had 2 episodes of chest pain over the last month, one of which required sublingual nitroglycerin. She describes it as a tightness that is most pronounced when she lays back. It typically last for about 20 minutes with a maximal intensity of 7/10. There are no associated symptoms, though it seems to occur close to her back spasms, which have been a recurrent problem for her in the past. She has chronic exertional dyspnea, which has been improving with the addition of Entresto. She notes that her lower extremity edema has increased a little bit over the last few months, though her weight has been stable. She denies orthopnea and PND as well as lightheadedness. She has occasional palpitations without accompanying symptoms. She has not had any bleeding, remaining on warfarin and aspirin. She denies any focal neurologic changes.  --------------------------------------------------------------------------------------------------  Cardiovascular  History & Procedures: Cardiovascular Problems:  Coronary artery disease status post anterior STEMI (2016)  Ischemic cardiomyopathy with chronic systolic heart failure  Rheumatic heart disease status post mechanical mitral valve replacement (2006)  Risk Factors:  Known coronary artery disease and hypertension  Cath/PCI:   LHC (2016, [place]): Reportedly proximal occlusion of the LAD, which could not be crossed with a wire.  CV Surgery:  Mitral valve replacement (2006, [facility]): 27 mm St. Jude bileaflet valve  EP Procedures and Devices:  ICD (04/01/15, [facility]): Medtronic single-chamber ICD  Non-Invasive Evaluation(s):  TTE (11/20/15): Normal LV size and wall thickness. LVEF 20-25% with severe diastolic dysfunction. Mildly reduced RV contraction. Normal MV prosthesis function.  Recent CV Pertinent Labs: Lab Results  Component Value Date   K 3.2 (L) 02/08/2017   BUN 8 02/08/2017   CREATININE 0.87 02/08/2017    --------------------------------------------------------------------------------------------------  Past Medical History:  Diagnosis Date  . Cervical cancer ([place])   . CHF (congestive heart failure) ([place])   . Chronic atrial fibrillation ([place])   . Coronary artery disease   . Essential hypertension   . MI (myocardial infarction) [facility])     Past Surgical History:  Procedure Laterality Date  . CARDIAC CATHETERIZATION    . CARDIAC DEFIBRILLATOR PLACEMENT    . RADICAL HYSTERECTOMY    . SHOULDER SURGERY Bilateral   . VALVE REPLACEMENT     Mitral valve; 27 mm St. Jude bileaflet valve    Current Meds  Medication Sig  . aspirin 81 MG chewable tablet Chew 81 mg by mouth daily.  [name] atorvastatin (LIPITOR) 80 MG tablet Take 80 mg by mouth daily.  . fluticasone (FLONASE) 50 MCG/ACT nasal spray Place 2 sprays into both nostrils as needed for allergies or rhinitis.  . furosemide (LASIX) 40  MG tablet Take 1 tablet (40 mg total) by mouth 3 (three) times daily.  . metoprolol  succinate (TOPROL-XL) 25 MG 24 hr tablet Take 1 tablet (25 mg total) by mouth daily. (Patient taking differently: Take 12.5 mg by mouth daily. )  . nitroGLYCERIN (NITROSTAT) 0.3 MG SL tablet Place 1 tablet (0.3 mg total) under the tongue every 5 (five) minutes as needed for chest pain.  . potassium chloride (K-DUR) 10 MEQ tablet Take 1 tablet (10 mEq total) by mouth daily.  . sacubitril-valsartan (ENTRESTO) 24-26 MG Take 1 tablet by mouth 2 (two) times daily.  [name] spironolactone (ALDACTONE) 25 MG tablet Take 12.5 mg by mouth daily. Take 1/2 tablet (12.5 mg) by mouth once daily  . traMADol (ULTRAM) 50 MG tablet Take 50 mg by mouth every 6 (six) hours as needed.  . warfarin (COUMADIN) 1 MG tablet 9 mg as directed. Take 9 mg on Tues, Thurs, Sun and 8 mg on all other days  . warfarin (COUMADIN) 2.5 MG tablet 8 mg. 8 mg on Mon, Wed, Fri, and Sat 9 mg on Tues, Thurs, Sun    Allergies: No allergies on file  Social History   Social History  . Marital status: Married    Spouse name: N/A  . Number of children: N/A  . Years of education: N/A   Occupational History  . Not on file.   Social History Main Topics  . Smoking status: Former Smoker    Packs/day: 1.00    Years: 4.00    Types: Cigarettes    Quit date: 1998  . Smokeless tobacco: Never Used  . Alcohol use No  . Drug use: No  . Sexual activity: Not on file   Other Topics Concern  . Not on file   Social History Narrative  . No narrative on file    Family History  Problem Relation Age of Onset  . Heart attack Mother   . Hypertension Mother   . Diabetes Mother   . Emphysema Father   . Diabetes Maternal Grandmother     Review of Systems: A 12-system review of systems was performed and was negative except as noted in the HPI.  --------------------------------------------------------------------------------------------------  Physical Exam: BP 110/60 (BP Location: Left Arm, Patient Position: Sitting, Cuff Size: Large)    Pulse 78   Ht 5' 2.5" (1.588 m)   Wt 211 lb 8 oz (95.9 kg)   LMP  (LMP Unknown)   BMI 38.07 kg/m   General:  Obese woman, seated comfortably in the exam room.  HEENT: No conjunctival pallor or scleral icterus. Moist mucous membranes. OP clear. Neck: Supple without lymphadenopathy, thyromegaly, JVD, or HJR. No carotid bruit. Lungs: Normal work of breathing. Clear to auscultation bilaterally without wheezes or crackles. Heart: Regular rate and rhythm without murmurs, rubs, or gallops. mechanical S1 noted. Unable to assess PMI due to body habitus. Well-healed median sternotomy scar.  Abd: Bowel sounds present. Soft, NT/ND. Unable to assess HSM due to body habitus. Ext: Trace pretibial edema bilaterally. Radial, PT, and DP pulses are 2+ bilaterally Skin: Warm and dry without rash. Neuro: CNIII-XII intact. Strength and fine-touch sensation intact in upper and lower extremities bilaterally. Psych: Normal mood and affect.  EKG:  Normal sinus rhythm with single PVC and lateral Q-waves.  No results found for: WBC, HGB, HCT, MCV, PLT  Lab Results  Component Value Date   NA 139 02/08/2017   K 3.2 (L) 02/08/2017   CL 102 02/08/2017   CO2 30 02/08/2017  BUN 8 02/08/2017   CREATININE 0.87 02/08/2017   GLUCOSE 109 (H) 02/08/2017    No results found for: CHOL, HDL, LDLCALC, LDLDIRECT, TRIG, CHOLHDL  --------------------------------------------------------------------------------------------------  ASSESSMENT AND PLAN: Chronic systolic heart failure due to ischemic cardiomyopathy Patient has mild edema but otherwise appears euvolemic with NYHA class II-III symptoms. She reports improved dyspnea with addition of Entresto. We will increase metopolol succinate to 50 mg daily to optimize heart failure therapy. I will also check a BMP today to ensure normal renal function and potassium, given mild hypokalemia earlier this month.  Coronary artery disease with stable angina [name] reports  two episodes of chest pain over the last month, though the pain is atypical (usually occurring when she lies back). We have agreed to increase metoprolol succinate to 50 mg daily. No other medication changes, including continuation of aspirin and high-intensity statin therapy.  Rheumatic heart disease status post mechanical MVR No evidence of valve dysfunction by history or exam. Continue indefinite aspirin and warfarin; INR followed by PCP. Patient will need antibiotic prophylaxis for any dental procedures.  Hypertension Blood pressure is normal today. We will increase metoprolol, as above, which her blood pressure should tolerate.  Follow-up: Return to clinic in 1 month.  [name], MD 02/23/2017 2:17 PM

## 2017-02-23 NOTE — Patient Instructions (Signed)
Medication Instructions:  Your physician has recommended you make the following change in your medication:  1- INCREASE Metoprolol succinate to 50 mg by mouth once a day.   Labwork: Your physician recommends that you return for lab work in: TODAY (BMP). - Please go to the [facility]. You will check in at the front desk to the right as you walk into the atrium. Valet Parking is offered if needed.    Testing/Procedures: none  Follow-Up: Your physician recommends that you schedule a follow-up appointment in: [place].   If you need a refill on your cardiac medications before your next appointment, please call your pharmacy.

## 2017-02-24 ENCOUNTER — Encounter: Payer: Self-pay | Admitting: Internal Medicine

## 2017-02-24 DIAGNOSIS — I2510 Atherosclerotic heart disease of native coronary artery without angina pectoris: Secondary | ICD-10-CM | POA: Insufficient documentation

## 2017-02-24 DIAGNOSIS — I25118 Atherosclerotic heart disease of native coronary artery with other forms of angina pectoris: Secondary | ICD-10-CM | POA: Insufficient documentation

## 2017-02-24 DIAGNOSIS — I255 Ischemic cardiomyopathy: Secondary | ICD-10-CM | POA: Insufficient documentation

## 2017-02-24 DIAGNOSIS — I099 Rheumatic heart disease, unspecified: Secondary | ICD-10-CM | POA: Insufficient documentation

## 2017-02-24 DIAGNOSIS — Z952 Presence of prosthetic heart valve: Secondary | ICD-10-CM | POA: Insufficient documentation

## 2017-03-08 ENCOUNTER — Ambulatory Visit (INDEPENDENT_AMBULATORY_CARE_PROVIDER_SITE_OTHER): Payer: BLUE CROSS/BLUE SHIELD | Admitting: *Deleted

## 2017-03-08 DIAGNOSIS — I255 Ischemic cardiomyopathy: Secondary | ICD-10-CM

## 2017-03-08 NOTE — Progress Notes (Signed)
Remote ICD transmission.   

## 2017-03-14 ENCOUNTER — Encounter: Payer: Self-pay | Admitting: Cardiology

## 2017-03-14 NOTE — Progress Notes (Signed)
ICM remote transmission rescheduled from 03/14/2017 to 03/29/2017.

## 2017-03-21 LAB — CUP PACEART REMOTE DEVICE CHECK
Battery Voltage: 3.02 V
Brady Statistic RV Percent Paced: 0 %
Date Time Interrogation Session: 20180710041805
HighPow Impedance: 70 Ohm
Implantable Lead Location: 753860
Lead Channel Impedance Value: 380 Ohm
Lead Channel Setting Pacing Amplitude: 2 V
Lead Channel Setting Sensing Sensitivity: 0.3 mV
MDC IDC LEAD IMPLANT DT: 20160802
MDC IDC MSMT BATTERY REMAINING LONGEVITY: 126 mo
MDC IDC MSMT LEADCHNL RV IMPEDANCE VALUE: 285 Ohm
MDC IDC MSMT LEADCHNL RV PACING THRESHOLD AMPLITUDE: 0.75 V
MDC IDC MSMT LEADCHNL RV PACING THRESHOLD PULSEWIDTH: 0.4 ms
MDC IDC MSMT LEADCHNL RV SENSING INTR AMPL: 9.875 mV
MDC IDC MSMT LEADCHNL RV SENSING INTR AMPL: 9.875 mV
MDC IDC PG IMPLANT DT: 20160802
MDC IDC SET LEADCHNL RV PACING PULSEWIDTH: 0.4 ms

## 2017-03-22 ENCOUNTER — Telehealth: Payer: Self-pay | Admitting: *Deleted

## 2017-03-22 NOTE — Telephone Encounter (Signed)
Patient was to get repeat BMP in 1-2 weeks from results from 02/23/17. Patient has not gone at this time. S/w patient and she verbalized understanding to go to [place] over the next few days for labs.

## 2017-03-23 ENCOUNTER — Other Ambulatory Visit
Admission: RE | Admit: 2017-03-23 | Discharge: 2017-03-23 | Disposition: A | Discharge status: Home / Self Care | Payer: BLUE CROSS/BLUE SHIELD | Source: Ambulatory Visit | Attending: Internal Medicine | Admitting: Internal Medicine

## 2017-03-23 DIAGNOSIS — R7989 Other specified abnormal findings of blood chemistry: Secondary | ICD-10-CM | POA: Diagnosis present

## 2017-03-23 LAB — BASIC METABOLIC PANEL
ANION GAP: 8 (ref 5–15)
BUN: 9 mg/dL (ref 6–20)
CALCIUM: 8.9 mg/dL (ref 8.9–10.3)
CO2: 25 mmol/L (ref 22–32)
Chloride: 105 mmol/L (ref 101–111)
Creatinine, Ser: 0.87 mg/dL (ref 0.44–1.00)
GFR calc Af Amer: >60 mL/min (ref >60)
GFR calc non Af Amer: >60 mL/min (ref >60)
GLUCOSE: 122 mg/dL — AB (ref 65–99)
Potassium: 3.7 mmol/L (ref 3.5–5.1)
Sodium: 138 mmol/L (ref 135–145)

## 2017-03-29 ENCOUNTER — Ambulatory Visit (INDEPENDENT_AMBULATORY_CARE_PROVIDER_SITE_OTHER): Payer: BLUE CROSS/BLUE SHIELD

## 2017-03-29 DIAGNOSIS — I5022 Chronic systolic (congestive) heart failure: Secondary | ICD-10-CM

## 2017-03-29 DIAGNOSIS — Z95810 Presence of automatic (implantable) cardiac defibrillator: Secondary | ICD-10-CM | POA: Diagnosis not present

## 2017-03-29 NOTE — Progress Notes (Signed)
EPIC Encounter for ICM Monitoring  Patient Name: Vanessa Carlson is a 56 y.o. female Date: 03/29/2017 Primary Care Physican: [facility], [place] Primary Cardiologist: Carlis Abbott, NP HF Electrophysiologist: Caryl Comes Dry Weight:unknown           Heart Failure questions reviewed, pt has some swelling of the ankles but resolved after taking extra Furosemide tablet.   Thoracic impedance normal but was abnormal suggesting fluid accumulation from 03/06/2017 to 03/23/2017.  Prescribed dosage: Furosemide 40 mg 1 tablet 3 times daily.   Labs: 03/23/2017 Creatinine 0.87, BUN 9,   Potassium 3.7, Sodium 138, EGFR >60 02/23/2017 Creatinine 1.04, BUN 13, Potassium 3.8, Sodium 135, EGFR 58->60  02/08/2017 Creatinine 0.87, BUN 8,   Potassium 3.2, Sodium 139, EGFR >60  01/28/2017 Creatinine 0.89, BUN 10, Potassium 3.6, Sodium 139, EGFR >60 12/31/2016 Creatinine 0.88, BUN 11, Potassium 3.2, Sodium 142, EGFR >60  Recommendations: No changes.  Advised to limit salt intake to 2000 mg/day and fluid intake to < 2 liters/day.  Encouraged to call for fluid symptoms.  Follow-up plan: ICM clinic phone appointment on 04/29/2017.  Office appointment scheduled 04/04/2017 with Darylene Price NP and Christell Faith, PA on 04/20/2017.  Copy of ICM check sent to device physician.   3 month ICM trend: 03/29/2017   1 Year ICM trend:      [name], RN 03/29/2017 4:36 PM

## 2017-03-30 ENCOUNTER — Ambulatory Visit: Payer: BLUE CROSS/BLUE SHIELD | Admitting: Physician Assistant

## 2017-04-04 ENCOUNTER — Ambulatory Visit: Payer: BLUE CROSS/BLUE SHIELD | Attending: Family | Admitting: Family

## 2017-04-04 ENCOUNTER — Encounter: Payer: Self-pay | Admitting: Family

## 2017-04-04 VITALS — BP 114/57 | HR 78 | Resp 20 | Ht 62.0 in | Wt 212.0 lb

## 2017-04-04 DIAGNOSIS — Z7982 Long term (current) use of aspirin: Secondary | ICD-10-CM | POA: Insufficient documentation

## 2017-04-04 DIAGNOSIS — I110 Hypertensive heart disease with heart failure: Secondary | ICD-10-CM | POA: Insufficient documentation

## 2017-04-04 DIAGNOSIS — Z833 Family history of diabetes mellitus: Secondary | ICD-10-CM | POA: Insufficient documentation

## 2017-04-04 DIAGNOSIS — Z7902 Long term (current) use of antithrombotics/antiplatelets: Secondary | ICD-10-CM | POA: Insufficient documentation

## 2017-04-04 DIAGNOSIS — Z952 Presence of prosthetic heart valve: Secondary | ICD-10-CM | POA: Insufficient documentation

## 2017-04-04 DIAGNOSIS — Z95810 Presence of automatic (implantable) cardiac defibrillator: Secondary | ICD-10-CM | POA: Insufficient documentation

## 2017-04-04 DIAGNOSIS — I219 Acute myocardial infarction, unspecified: Secondary | ICD-10-CM | POA: Insufficient documentation

## 2017-04-04 DIAGNOSIS — I2510 Atherosclerotic heart disease of native coronary artery without angina pectoris: Secondary | ICD-10-CM | POA: Insufficient documentation

## 2017-04-04 DIAGNOSIS — Z9889 Other specified postprocedural states: Secondary | ICD-10-CM | POA: Insufficient documentation

## 2017-04-04 DIAGNOSIS — I482 Chronic atrial fibrillation: Secondary | ICD-10-CM | POA: Insufficient documentation

## 2017-04-04 DIAGNOSIS — Z836 Family history of other diseases of the respiratory system: Secondary | ICD-10-CM | POA: Insufficient documentation

## 2017-04-04 DIAGNOSIS — Z8541 Personal history of malignant neoplasm of cervix uteri: Secondary | ICD-10-CM | POA: Insufficient documentation

## 2017-04-04 DIAGNOSIS — Z87891 Personal history of nicotine dependence: Secondary | ICD-10-CM | POA: Insufficient documentation

## 2017-04-04 DIAGNOSIS — E876 Hypokalemia: Secondary | ICD-10-CM

## 2017-04-04 DIAGNOSIS — I5022 Chronic systolic (congestive) heart failure: Secondary | ICD-10-CM

## 2017-04-04 DIAGNOSIS — I480 Paroxysmal atrial fibrillation: Secondary | ICD-10-CM

## 2017-04-04 DIAGNOSIS — I10 Essential (primary) hypertension: Secondary | ICD-10-CM

## 2017-04-04 DIAGNOSIS — Z8249 Family history of ischemic heart disease and other diseases of the circulatory system: Secondary | ICD-10-CM | POA: Insufficient documentation

## 2017-04-04 DIAGNOSIS — I252 Old myocardial infarction: Secondary | ICD-10-CM | POA: Insufficient documentation

## 2017-04-04 DIAGNOSIS — Z90710 Acquired absence of both cervix and uterus: Secondary | ICD-10-CM | POA: Insufficient documentation

## 2017-04-04 NOTE — Progress Notes (Signed)
Patient ID: Vanessa Carlson, female    DOB: 1960-11-04, 56 y.o.   MRN: [id number]  HPI  [name] is a 56 y/o female with a history of cervical cancer, atrial fibrillation, CAD, HTN, MI, AICD and chronic heart failure.   Reviewed last echo report done on 11/20/15 which showed an EF of 20-25%.  Was admitted in 2006 to [facility] for mitral valve replacement. Admitted March 2016 due to anterior wall MI and received ICD.   She presents today for her follow-up visit with a chief complaint of shortness of breath with moderate exertion. She describes this as having been present for several years with varying levels of severity. She feels like her breathing has improved since the last time she was here. She has associated fatigue, edema and light-headedness on occasion along with this.  Past Medical History:  Diagnosis Date  . Cervical cancer ([place])   . CHF (congestive heart failure) ([place])   . Chronic atrial fibrillation ([place])   . Coronary artery disease   . Essential hypertension   . MI (myocardial infarction) [facility])    Past Surgical History:  Procedure Laterality Date  . CARDIAC CATHETERIZATION    . CARDIAC DEFIBRILLATOR PLACEMENT    . RADICAL HYSTERECTOMY    . SHOULDER SURGERY Bilateral   . VALVE REPLACEMENT     Mitral valve; 27 mm St. Jude bileaflet valve   Family History  Problem Relation Age of Onset  . Heart attack Mother   . Hypertension Mother   . Diabetes Mother   . Emphysema Father   . Diabetes Maternal Grandmother    Social History  Substance Use Topics  . Smoking status: Former Smoker    Packs/day: 1.00    Years: 4.00    Types: Cigarettes    Quit date: 1998  . Smokeless tobacco: Never Used  . Alcohol use No   No Known Allergies Prior to Admission medications   Medication Sig Start Date End Date Taking? Authorizing Provider  aspirin 81 MG chewable tablet Chew 81 mg by mouth daily.   Yes [provider]  atorvastatin (LIPITOR) 80 MG tablet Take 80 mg by  mouth daily.   Yes [provider]  fluticasone (FLONASE) 50 MCG/ACT nasal spray Place 2 sprays into both nostrils as needed for allergies or rhinitis.   Yes [provider]  furosemide (LASIX) 40 MG tablet Take 1 tablet (40 mg total) by mouth 3 (three) times daily. 02/15/17  Yes Darylene Price A, FNP  metoprolol succinate (TOPROL-XL) 50 MG 24 hr tablet Take 1 tablet (50 mg total) by mouth daily. Take with or immediately following a meal. 02/23/17 05/24/17 Yes End, Harrell Gave, MD  nitroGLYCERIN (NITROSTAT) 0.3 MG SL tablet Place 1 tablet (0.3 mg total) under the tongue every 5 (five) minutes as needed for chest pain. 02/08/17  Yes Hackney, Tina A, FNP  sacubitril-valsartan (ENTRESTO) 24-26 MG Take 1 tablet by mouth 2 (two) times daily. 02/08/17  Yes Hackney, Otila Kluver A, FNP  spironolactone (ALDACTONE) 25 MG tablet Take 25 mg by mouth daily.   Yes [provider]  traMADol (ULTRAM) 50 MG tablet Take 50 mg by mouth every 6 (six) hours as needed.   Yes [provider]  warfarin (COUMADIN) 1 MG tablet 9 mg as directed. Take 9 mg on Tues, Thurs, Sun and 8 mg on all other days   Yes [provider]  warfarin (COUMADIN) 2.5 MG tablet 8 mg. 8 mg on Mon, Wed, Fri, and Sat 9 mg  on Tues, Thurs, Sun   Yes [provider]   Review of Systems  Constitutional: Positive for fatigue (better). Negative for appetite change.  HENT: Negative for congestion, postnasal drip and sore throat.   Eyes: Negative.   Respiratory: Positive for shortness of breath (better). Negative for cough and chest tightness.   Cardiovascular: Positive for leg swelling. Negative for chest pain and palpitations.  Gastrointestinal: Negative for abdominal distention and abdominal pain.  Endocrine: Negative.   Genitourinary: Negative.   Musculoskeletal: Negative for back pain and neck pain.  Skin: Negative.   Allergic/Immunologic: Negative.   Neurological: Positive for light-headedness  (infrequent). Negative for dizziness.  Hematological: Negative for adenopathy. Does not bruise/bleed easily.  Psychiatric/Behavioral: Negative for dysphoric mood and sleep disturbance (sleeping on 2 pillows). The patient is not nervous/anxious.    Vitals:   04/04/17 0941  BP: (!) 114/57  Pulse: 78  Resp: 20  SpO2: 99%  Weight: 212 lb (96.2 kg)  Height: 5\' 2"  (1.575 m)   Wt Readings from Last 3 Encounters:  04/04/17 212 lb (96.2 kg)  02/23/17 211 lb 8 oz (95.9 kg)  02/08/17 211 lb 8 oz (95.9 kg)   Lab Results  Component Value Date   CREATININE 0.87 03/23/2017   CREATININE 1.04 (H) 02/23/2017   CREATININE 0.87 02/08/2017    Physical Exam  Constitutional: She is oriented to person, place, and time. She appears well-developed and well-nourished.  HENT:  Head: Normocephalic and atraumatic.  Neck: Normal range of motion. Neck supple. No JVD present.  Cardiovascular: Normal rate and regular rhythm.   Pulmonary/Chest: Effort normal. She has no wheezes. She has no rales.  Abdominal: Soft. She exhibits no distension.  Musculoskeletal: She exhibits edema (trace amount around bilateral ankles). She exhibits no tenderness.  Neurological: She is alert and oriented to person, place, and time.  Skin: Skin is warm and dry.  Psychiatric: She has a normal mood and affect. Her behavior is normal. Thought content normal.  Nursing note and vitals reviewed.  Assessment & Plan:  1: Chronic heart failure with reduced ejection fraction- - NYHA class II - euvolemic today - not adding salt to food. Reminded to follow a 2000 mg sodium diet as she says that she's been eating saltier foods lately - weighing daily. Instructed to call for an overnight weight gain of >2 pounds or a weekly weight gain of >5 pounds; weight stable since she was last here - taking furosemide 40mg  AM/ 80mg  PM - patient aware that she will need a new echo. May be good to wait until she has been on Entresto for a few months -  taking spironolactone 25 mg daily  - saw cardiologist (End) on 02/23/17 and returns 04/20/17 - discussed possibly increasing entresto at her next clinic visit  2: HTN- - BP looks good today  - [facility] with [facility]   3: Atrial fibrillation- - heart rate controlled at this time - AICD present but denies shocks - continue warfarin and metoprolol succinate  - follows with EP Caryl Comes) and was last seen by him 12/07/16  4: Hypokalemia- - BMP on 03/23/17 reviewed and shows potassium 3.7, GFR >60 and sodium 138  Patient did not bring her medications nor a list. Each medication was verbally reviewed with the patient and she was encouraged to bring the bottles to every visit to confirm accuracy of list.  Return in 2 months or sooner for any questions/problems before then.

## 2017-04-04 NOTE — Patient Instructions (Signed)
Continue weighing daily and call for an overnight weight gain of > 2 pounds or a weekly weight gain of >5 pounds. 

## 2017-04-11 ENCOUNTER — Telehealth: Payer: Self-pay | Admitting: Internal Medicine

## 2017-04-11 MED ORDER — METOPROLOL SUCCINATE ER 50 MG PO TB24: ORAL_TABLET | ORAL | 3 refills | Status: DC

## 2017-04-11 NOTE — Telephone Encounter (Signed)
Returned call to patient. She states she finished out her metoprolol succinate prescription of 25 mg tablets where she was taking 2 tablets once a day since June, 2018. Got her new prescription for metoprolol succinate 50 mg once a day and has been taking for about 1 week. Since starting the 50 mg tablet patient has experienced some diarrhea and periodic sharp stomach pains throughout the day. She is taking the medication with food. States this happened the last time she tried to increase metoprolol to the 50 mg tablet. On 12/07/16, Dr Caryl Comes decreased her back to 25 mg daily. She took 2- 25 mg tablets with no problem from last office visit with Dr End on 02/23/17.  Called and s/w with pharmacist, Juanda Crumble, at [facility]. He stated it is the same manufacturer and could not see from a clinical standpoint why this would be happening. We could send in a Rx for metoprolol succinate 25 mg, 2 tablets once a day. However, insurance may not pay for the medication this way or the cost could be high.  Patient aware of pharmacist recommendations. Advised I will route to Dr End for further advice.

## 2017-04-11 NOTE — Telephone Encounter (Signed)
Maybe we should try metoprolol succinate 25 mg twice a day. If she continues to have abdominal symptoms with this, we could try switching to an alternate agent such as carvedilol or bisoprolol. Thanks.  [name], MD [facility] Pager: [phone]

## 2017-04-11 NOTE — Telephone Encounter (Signed)
S/w patient. She verbalized understanding to take 1/2 tablet (25 mg) by mouth twice a day. She will let us know if she has any problems with this. She also has upcoming appt with Christell Faith on 04/20/17 where she can discuss her concerns and progress.

## 2017-04-11 NOTE — Telephone Encounter (Signed)
Pt c/o medication issue:  1. Name of Medication: metoprolol   2. How are you currently taking this medication (dosage and times per day)?  Used to take two 25 mg of it  Now they gave her it all in one pill  3. Are you having a reaction (difficulty breathing--STAT)? No   4. What is your medication issue? She is having issues with the pill,stating the one pill that she is now taking versus the two that she was taking it is not working along with her stomach  Please advise.

## 2017-04-20 ENCOUNTER — Ambulatory Visit: Payer: BLUE CROSS/BLUE SHIELD | Admitting: Physician Assistant

## 2017-04-26 ENCOUNTER — Ambulatory Visit (INDEPENDENT_AMBULATORY_CARE_PROVIDER_SITE_OTHER): Payer: BLUE CROSS/BLUE SHIELD | Admitting: Nurse Practitioner

## 2017-04-26 ENCOUNTER — Encounter: Payer: Self-pay | Admitting: Nurse Practitioner

## 2017-04-26 VITALS — BP 110/62 | HR 71 | Ht 62.0 in | Wt 213.5 lb

## 2017-04-26 DIAGNOSIS — I5042 Chronic combined systolic (congestive) and diastolic (congestive) heart failure: Secondary | ICD-10-CM

## 2017-04-26 DIAGNOSIS — R002 Palpitations: Secondary | ICD-10-CM | POA: Diagnosis not present

## 2017-04-26 DIAGNOSIS — I25119 Atherosclerotic heart disease of native coronary artery with unspecified angina pectoris: Secondary | ICD-10-CM

## 2017-04-26 DIAGNOSIS — I480 Paroxysmal atrial fibrillation: Secondary | ICD-10-CM | POA: Diagnosis not present

## 2017-04-26 DIAGNOSIS — Z952 Presence of prosthetic heart valve: Secondary | ICD-10-CM | POA: Diagnosis not present

## 2017-04-26 DIAGNOSIS — E782 Mixed hyperlipidemia: Secondary | ICD-10-CM

## 2017-04-26 NOTE — Progress Notes (Signed)
Office Visit    Patient Name: Vanessa Carlson Date of Encounter: 04/26/2017  Primary Care Provider:  [place] Primary Cardiologist:  Andree Coss, MD   Chief Complaint    56 year old female with a history of coronary artery disease, ischemic myopathy, chronic combined systolic and diastolic congestive heart failure with an EF of 20-25%, mitral valve disease status post mechanical mitral valve replacement on chronic Coumadin, and paroxysmal atrial fibrillation, who presents for follow-up.  Past Medical History    Past Medical History:  Diagnosis Date  . Anterior myocardial infarction [facility])    a. 10/2014 - occluded LAD, complicated by cardiogenic shock-->Med Rx as interventional team was unable to open LAD.  [name] Cervical cancer ([place])   . Chronic combined systolic and diastolic CHF (congestive heart failure) ([place])    a. 10/2015 Echo: EF 20-25%, sev diast dysfxn, mildly reduced RV fxn. Nl MV prosthesis.  . Coronary artery disease    a. 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  . Essential hypertension   . Ischemic cardiomyopathy    a. 03/2015 s/p MDT single lead AICD;  b. 10/2015 Echo: EF 20-25%.  . Mitral valve disease    a. 2006 s/p MVR w/ 68mm SJM bileaflet mechanical valve-->chronic coumadin/ASA.   Past Surgical History:  Procedure Laterality Date  . CARDIAC CATHETERIZATION    . CARDIAC DEFIBRILLATOR PLACEMENT    . RADICAL HYSTERECTOMY    . SHOULDER SURGERY Bilateral   . VALVE REPLACEMENT     Mitral valve; 27 mm St. Jude bileaflet valve    Allergies  No Known Allergies  History of Present Illness    56 year old female with the above complex past medical history including mitral valve disease status post mitral valve replacement in 2006 with a mechanical mitral valve. She has been on chronic Coumadin and aspirin since. Other history includes coronary artery disease  status post anterior STEMI in 2016. The LAD was found to be occluded proximally and this was medically managed. Patient subsequently developed LV dysfunction and required ICD placement in August 2016. Most recent echo in March 2017 showed an EF 20-25%. In that setting, other history does include ischemic cardiomyopathy and combined heart failure.   Vanessa Carlson was last seen in clinic one month ago, at which time she reported intermittent atypical chest pain. Toprol-XL dose was increased to 50 mg daily. Following this change, she noted occasional GI upset which she felt might be related to higher beta blocker dose. She called the office and was advised to change it to half a tablet twice a day. Since doing this, she has tolerated much better. She has not had any further GI distress. She has not been experiencing any chest pain. She has also done well from a heart failure standpoint. Her weights are stable at home. She denies dyspnea with usual activities on a flat surface, PND, orthopnea, dizziness, syncope, edema, or early satiety. She does express dyspnea on exertion walking up more than 1 flight of stairs. She notes occasional palpitations, occurring a few times a week lasting 10 seconds or less, and resolving spontaneously. Since titrating beta blocker, these are less frequent.  Home Medications    Prior to Admission medications   Medication Sig Start Date End Date Taking? Authorizing Provider  aspirin 81 MG chewable tablet Chew 81 mg by mouth daily.   Yes [provider]  atorvastatin (LIPITOR) 80 MG tablet Take 80  mg by mouth daily.   Yes [provider]  fluticasone (FLONASE) 50 MCG/ACT nasal spray Place 2 sprays into both nostrils as needed for allergies or rhinitis.   Yes [provider]  furosemide (LASIX) 40 MG tablet Take 1 tablet (40 mg total) by mouth 3 (three) times daily. 02/15/17  Yes Darylene Price A, FNP  metoprolol succinate (TOPROL-XL) 50 MG 24 hr tablet  Take 1/2 tablet (25 mg) by mouth two times a day. Take with or immediately following a meal. 04/11/17  Yes End, Harrell Gave, MD  nitroGLYCERIN (NITROSTAT) 0.3 MG SL tablet Place 1 tablet (0.3 mg total) under the tongue every 5 (five) minutes as needed for chest pain. 02/08/17  Yes Hackney, Tina A, FNP  sacubitril-valsartan (ENTRESTO) 24-26 MG Take 1 tablet by mouth 2 (two) times daily. 02/08/17  Yes Hackney, Otila Kluver A, FNP  spironolactone (ALDACTONE) 25 MG tablet Take 25 mg by mouth daily.   Yes [provider]  traMADol (ULTRAM) 50 MG tablet Take 50 mg by mouth every 6 (six) hours as needed.   Yes [provider]  warfarin (COUMADIN) 1 MG tablet 9 mg as directed. Take 9 mg on Tues, Thurs, Sun and 8 mg on all other days   Yes [provider]  warfarin (COUMADIN) 2.5 MG tablet 8 mg. 8 mg on Mon, Wed, Fri, and Sat 9 mg on Tues, Thurs, Sun   Yes [provider]    Review of Systems    Overall doing well. She does have some dyspnea on exertion when walking up stairs. This is stable. She has intermittent brief episodes of palpitations a few times a week and these have improved since titration of beta blocker. She denies chest pain, PND, orthopnea, dizziness, syncope, edema, or early satiety .  All other systems reviewed and are otherwise negative except as noted above.  Physical Exam    VS:  BP 110/62 (BP Location: Left Arm, Patient Position: Sitting, Cuff Size: Normal)   Pulse 71   Ht 5\' 2"  (1.575 m)   Wt 213 lb 8 oz (96.8 kg)   LMP  (LMP Unknown)   BMI 39.05 kg/m  , BMI Body mass index is 39.05 kg/m. GEN: Well nourished, well developed, in no acute distress.  HEENT: normal.  Neck: Supple, no JVD, carotid bruits, or masses. Cardiac: RRR, Mechanical S1, no murmurs, rubs, or gallops. No clubbing, cyanosis, edema.  Radials/DP/PT 2+ and equal bilaterally.  Respiratory:  Respirations regular and unlabored, clear to auscultation bilaterally. GI: Soft, nontender,  nondistended, BS + x 4. MS: no deformity or atrophy. Skin: warm and dry, no rash. Neuro:  Strength and sensation are intact. Psych: Normal affect.  Accessory Clinical Findings    ECG - regular sinus rhythm, 71, anterior infarct, no acute changes.   Assessment & Plan    1.  Chronic combined systolic and diastolic congestive heart failure/ischemic cardiomyopathy: Patient has been doing relatively well since her last visit. She does not have any significant dyspnea with usual activities and her weights have been stable at home. She remains on long-acting beta blocker, Entresto, and spironolactone therapy and is tolerating these well. Blood pressure is soft today at 110/62 and therefore, I will not titrate Entresto further today.    2. Coronary artery disease: She has not been having any significant chest pain since her last visit. She is now tolerating the higher dose metoprolol well and splitting it into half a tablet twice a day. She otherwise remains on aspirin  and high potency statin therapy.  3.  Essential hypertension: Blood pressure is stable on beta blocker, Entresto, and spironolactone therapy.  4. Hyperlipidemia: She remains on high potency statin therapy. She had normal LFTs in September 2017. She has not had lipids checked since in [place]. I will arrange for fasting lipids and repeat LFTs prior to her next visit.  5. Rheumatic heart disease status post mechanical mitral valve replacement: Normal valve function on echo in March 2017.  INR is being [facility] at the [facility].she will require antibiotic prophylaxis for any dental procedures.  6. Palpitations:  Occasional palps - few x/wk, 10 seconds or less.  Better since titrating  blocker.  7.  Disposition: Patient will follow-up in heart failure clinic in early October. Planned follow-up with Dr. Saunders Revel in December or sooner if necessary.   Murray Hodgkins, NP 04/26/2017, 2:45 PM

## 2017-04-26 NOTE — Patient Instructions (Signed)
Follow-Up: Your physician wants you to follow-up in: December 2018 with Dr. Saunders Revel. You will receive a reminder letter in the mail two months in advance. If you don't receive a letter, please call our office to schedule the follow-up appointment.  It was a pleasure seeing you today here in the office. Please do not hesitate to give [place] a call back if you have any further questions. [place], BSN

## 2017-04-29 ENCOUNTER — Ambulatory Visit (INDEPENDENT_AMBULATORY_CARE_PROVIDER_SITE_OTHER): Payer: BLUE CROSS/BLUE SHIELD

## 2017-04-29 DIAGNOSIS — I5042 Chronic combined systolic (congestive) and diastolic (congestive) heart failure: Secondary | ICD-10-CM

## 2017-04-29 DIAGNOSIS — Z95810 Presence of automatic (implantable) cardiac defibrillator: Secondary | ICD-10-CM | POA: Diagnosis not present

## 2017-04-29 NOTE — Progress Notes (Signed)
EPIC Encounter for ICM Monitoring  Patient Name: Vanessa Carlson is a 56 y.o. female Date: 04/29/2017 Primary Care Physican: [facility] [place] Primary Cardiologist: Carlis Abbott, NP HF Electrophysiologist: Caryl Comes Dry Weight:unknown      Heart Failure questions reviewed, pt has swelling in ankles and hands.   Thoracic impedance abnormal suggesting fluid accumulation starting 04/18/2017.  Prescribed dosage: Furosemide 40 mg 1 tablet 3 times daily.   Labs: 03/23/2017 Creatinine 0.87, BUN 9,   Potassium 3.7, Sodium 138, EGFR >60 02/23/2017 Creatinine 1.04, BUN 13, Potassium 3.8, Sodium 135, EGFR 58->60  02/08/2017 Creatinine 0.87, BUN 8,   Potassium 3.2, Sodium 139, EGFR >60  01/28/2017 Creatinine 0.89, BUN 10, Potassium 3.6, Sodium 139, EGFR >60 12/31/2016 Creatinine 0.88, BUN 11, Potassium 3.2, Sodium 142, EGFR >60*  Recommendations: Increase Furosemide to 60 mg every AM x 2 days, 60 mg midday x 2 days and 40 mg PM and then return to 40 mg tid.  She verbalized understanding  Follow-up plan: ICM clinic phone appointment on 05/12/2017 to recheck fluid levels.  Office appointment scheduled 06/06/2017 with Darylene Price NP   Copy of ICM check sent to Dr. Caryl Comes, Dr. Saunders Revel and [facility].   3 month ICM trend: 04/29/2017   1 Year ICM trend:      [name], RN 04/29/2017 10:50 AM

## 2017-05-03 ENCOUNTER — Telehealth: Payer: Self-pay | Admitting: Family

## 2017-05-03 NOTE — Telephone Encounter (Signed)
Spoke with patient regarding ICM transmission that indicated fluid retention. Furosemide had been adjusted based on that reading.  Called patient to see how she was feeling. She says that she's taking 40mg  AM and 80mg  PM and if she needs to, based on weight/edema, she will take an additional 40mg  at bedtime. Hasn't had to take the bedtime dose in a few days. Weight is unchanged and she does elevate her legs when she's sitting for long periods of time.  Patient is due to see [place] in October and she was instructed to call us if she needed to be seen before then.

## 2017-05-05 DIAGNOSIS — I482 Chronic atrial fibrillation: Secondary | ICD-10-CM | POA: Diagnosis not present

## 2017-05-05 DIAGNOSIS — Z7901 Long term (current) use of anticoagulants: Secondary | ICD-10-CM | POA: Diagnosis not present

## 2017-05-12 ENCOUNTER — Ambulatory Visit (INDEPENDENT_AMBULATORY_CARE_PROVIDER_SITE_OTHER): Payer: Self-pay

## 2017-05-12 DIAGNOSIS — Z95810 Presence of automatic (implantable) cardiac defibrillator: Secondary | ICD-10-CM

## 2017-05-12 DIAGNOSIS — I5042 Chronic combined systolic (congestive) and diastolic (congestive) heart failure: Secondary | ICD-10-CM

## 2017-05-12 NOTE — Progress Notes (Signed)
EPIC Encounter for ICM Monitoring  Patient Name: Vanessa Carlson is a 56 y.o. female Date: 05/12/2017 Primary Care Physican: [facility] [place] Primary Cardiologist: Carlis Abbott, NP HF Electrophysiologist: Caryl Comes Dry Weight:unknown      Heart Failure questions reviewed, pt asymptomatic.   Thoracic impedance returned to normal since 05/05/2017, after taking increased Furosemide dosage, but was abnormal suggesting fluid accumulation 04/20/2017 to 05/04/2017.  Prescribed dosage: Furosemide 40 mg 1 tablet 3 times daily.   Labs: 03/23/2017 Creatinine 0.87, BUN 9, Potassium 3.7, Sodium 138, EGFR >60 02/23/2017 Creatinine 1.04, BUN 13, Potassium 3.8, Sodium 135, EGFR 58->60  02/08/2017 Creatinine 0.87, BUN 8, Potassium 3.2, Sodium 139, EGFR >60  01/28/2017 Creatinine 0.89, BUN 10, Potassium 3.6, Sodium 139, EGFR >60 12/31/2016 Creatinine 0.88, BUN 11, Potassium 3.2, Sodium 142, EGFR >60*  Recommendations: No changes.  Encouraged to call for fluid symptoms.  Follow-up plan: ICM clinic phone appointment on 06/14/2017.  Office appointment scheduled 06/06/2017 with Darylene Price NP   Copy of ICM check sent to Dr. Caryl Comes.   3 month ICM trend: 05/12/2017   1 Year ICM trend:      [name], RN 05/12/2017 3:08 PM

## 2017-06-06 ENCOUNTER — Ambulatory Visit: Payer: Medicare Other | Attending: Family | Admitting: Family

## 2017-06-06 ENCOUNTER — Encounter: Payer: Self-pay | Admitting: Family

## 2017-06-06 VITALS — BP 113/58 | HR 68 | Resp 20 | Ht 62.0 in | Wt 216.1 lb

## 2017-06-06 DIAGNOSIS — I482 Chronic atrial fibrillation, unspecified: Secondary | ICD-10-CM

## 2017-06-06 DIAGNOSIS — Z825 Family history of asthma and other chronic lower respiratory diseases: Secondary | ICD-10-CM | POA: Diagnosis not present

## 2017-06-06 DIAGNOSIS — Z95810 Presence of automatic (implantable) cardiac defibrillator: Secondary | ICD-10-CM | POA: Diagnosis not present

## 2017-06-06 DIAGNOSIS — Z8249 Family history of ischemic heart disease and other diseases of the circulatory system: Secondary | ICD-10-CM | POA: Insufficient documentation

## 2017-06-06 DIAGNOSIS — I509 Heart failure, unspecified: Secondary | ICD-10-CM | POA: Diagnosis present

## 2017-06-06 DIAGNOSIS — I252 Old myocardial infarction: Secondary | ICD-10-CM | POA: Diagnosis not present

## 2017-06-06 DIAGNOSIS — Z79899 Other long term (current) drug therapy: Secondary | ICD-10-CM | POA: Insufficient documentation

## 2017-06-06 DIAGNOSIS — Z8541 Personal history of malignant neoplasm of cervix uteri: Secondary | ICD-10-CM | POA: Insufficient documentation

## 2017-06-06 DIAGNOSIS — Z952 Presence of prosthetic heart valve: Secondary | ICD-10-CM | POA: Diagnosis not present

## 2017-06-06 DIAGNOSIS — Z87891 Personal history of nicotine dependence: Secondary | ICD-10-CM | POA: Insufficient documentation

## 2017-06-06 DIAGNOSIS — I4891 Unspecified atrial fibrillation: Secondary | ICD-10-CM | POA: Diagnosis not present

## 2017-06-06 DIAGNOSIS — I10 Essential (primary) hypertension: Secondary | ICD-10-CM

## 2017-06-06 DIAGNOSIS — I110 Hypertensive heart disease with heart failure: Secondary | ICD-10-CM | POA: Insufficient documentation

## 2017-06-06 DIAGNOSIS — I255 Ischemic cardiomyopathy: Secondary | ICD-10-CM | POA: Diagnosis not present

## 2017-06-06 DIAGNOSIS — I5022 Chronic systolic (congestive) heart failure: Secondary | ICD-10-CM | POA: Diagnosis not present

## 2017-06-06 DIAGNOSIS — I2510 Atherosclerotic heart disease of native coronary artery without angina pectoris: Secondary | ICD-10-CM | POA: Diagnosis not present

## 2017-06-06 DIAGNOSIS — Z7982 Long term (current) use of aspirin: Secondary | ICD-10-CM | POA: Diagnosis not present

## 2017-06-06 DIAGNOSIS — Z833 Family history of diabetes mellitus: Secondary | ICD-10-CM | POA: Insufficient documentation

## 2017-06-06 DIAGNOSIS — Z7901 Long term (current) use of anticoagulants: Secondary | ICD-10-CM | POA: Diagnosis not present

## 2017-06-06 NOTE — Progress Notes (Addendum)
Patient ID: Vanessa Carlson, female    DOB: 04/14/1961, 56 y.o.   MRN: [id number]  HPI  [name] is a 56 y/o female with a history of cervical cancer, atrial fibrillation, CAD, HTN, MI, AICD and chronic heart failure.   Reviewed last echo report done on 11/20/15 which showed an EF of 20-25%.  Was admitted in 2006 to [facility] for mitral valve replacement. Admitted March 2016 due to anterior wall MI and received ICD.   She presents today for her follow-up visit with a chief complaint of minimal shortness of breath upon exertion. She describes this as chronic in nature having been present for several years with varying levels of intensity. She has associated dry cough and intermittent edema.   Past Medical History:  Diagnosis Date  . Anterior myocardial infarction [facility])    a. 10/2014 - occluded LAD, complicated by cardiogenic shock-->Med Rx as interventional team was unable to open LAD.  Vanessa Carlson Cervical cancer ([place])   . Chronic combined systolic and diastolic CHF (congestive heart failure) ([place])    a. 10/2015 Echo: EF 20-25%, sev diast dysfxn, mildly reduced RV fxn. Nl MV prosthesis.  . Coronary artery disease    a. 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  . Essential hypertension   . Ischemic cardiomyopathy    a. 03/2015 s/p MDT single lead AICD;  b. 10/2015 Echo: EF 20-25%.  . Mitral valve disease    a. 2006 s/p MVR w/ 64mm SJM bileaflet mechanical valve-->chronic coumadin/ASA.   Past Surgical History:  Procedure Laterality Date  . CARDIAC CATHETERIZATION    . CARDIAC DEFIBRILLATOR PLACEMENT    . RADICAL HYSTERECTOMY    . SHOULDER SURGERY Bilateral   . VALVE REPLACEMENT     Mitral valve; 27 mm St. Jude bileaflet valve   Family History  Problem Relation Age of Onset  . Heart attack Mother   . Hypertension Mother   . Diabetes Mother   . Emphysema Father   . Diabetes Maternal Grandmother    Social  History  Substance Use Topics  . Smoking status: Former Smoker    Packs/day: 1.00    Years: 4.00    Types: Cigarettes    Quit date: 1998  . Smokeless tobacco: Never Used  . Alcohol use No   No Known Allergies  Prior to Admission medications   Medication Sig Start Date End Date Taking? Authorizing Provider  aspirin 81 MG chewable tablet Chew 81 mg by mouth daily.   Yes [provider]  atorvastatin (LIPITOR) 80 MG tablet Take 80 mg by mouth daily.   Yes [provider]  fluticasone (FLONASE) 50 MCG/ACT nasal spray Place 2 sprays into both nostrils as needed for allergies or rhinitis.   Yes [provider]  furosemide (LASIX) 40 MG tablet Take 1 tablet (40 mg total) by mouth 3 (three) times daily. 02/15/17  Yes Darylene Price A, FNP  metoprolol succinate (TOPROL-XL) 50 MG 24 hr tablet Take 1/2 tablet (25 mg) by mouth two times a day. Take with or immediately following a meal. 04/11/17  Yes End, Harrell Gave, MD  nitroGLYCERIN (NITROSTAT) 0.3 MG SL tablet Place 1 tablet (0.3 mg total) under the tongue every 5 (five) minutes as needed for chest pain. 02/08/17  Yes Hackney, Tina A, FNP  sacubitril-valsartan (ENTRESTO) 24-26 MG Take 1 tablet by mouth 2 (two) times daily. 02/08/17  Yes Alisa Graff, FNP  spironolactone (ALDACTONE) 25  MG tablet Take 25 mg by mouth daily.   Yes [provider]  traMADol (ULTRAM) 50 MG tablet Take 50 mg by mouth every 6 (six) hours as needed.   Yes [provider]  warfarin (COUMADIN) 1 MG tablet 9 mg as directed. Take 9 mg on Tues, Thurs, Sun and 8 mg on all other days   Yes [provider]  warfarin (COUMADIN) 2.5 MG tablet 8 mg. 8 mg on Mon, Wed, Fri, and Sat 9 mg on Tues, Thurs, Sun   Yes [provider]    Review of Systems  Constitutional: Negative for appetite change and fatigue.  HENT: Negative for congestion, postnasal drip and sore throat.   Eyes: Negative.   Respiratory: Positive for cough  (dry cough except none for the last 2 days) and shortness of breath (better). Negative for chest tightness.   Cardiovascular: Positive for leg swelling. Negative for chest pain and palpitations.  Gastrointestinal: Negative for abdominal distention and abdominal pain.  Endocrine: Negative.   Genitourinary: Negative.   Musculoskeletal: Negative for back pain and neck pain.  Skin: Negative.   Allergic/Immunologic: Negative.   Neurological: Negative for dizziness and light-headedness.  Hematological: Negative for adenopathy. Does not bruise/bleed easily.  Psychiatric/Behavioral: Negative for dysphoric mood and sleep disturbance (sleeping on 2 pillows). The patient is not nervous/anxious.    Vitals:   06/06/17 0916  BP: (!) 113/58  Pulse: 68  Resp: 20  SpO2: 98%  Weight: 216 lb 2 oz (98 kg)  Height: 5\' 2"  (1.575 m)   Wt Readings from Last 3 Encounters:  06/06/17 216 lb 2 oz (98 kg)  04/26/17 213 lb 8 oz (96.8 kg)  04/04/17 212 lb (96.2 kg)    Lab Results  Component Value Date   CREATININE 0.87 03/23/2017   CREATININE 1.04 (H) 02/23/2017   CREATININE 0.87 02/08/2017    Physical Exam  Constitutional: She is oriented to person, place, and time. She appears well-developed and well-nourished.  HENT:  Head: Normocephalic and atraumatic.  Neck: Normal range of motion. Neck supple. No JVD present.  Cardiovascular: Normal rate and regular rhythm.   Pulmonary/Chest: Effort normal. She has no wheezes. She has no rales.  Abdominal: Soft. She exhibits no distension.  Musculoskeletal: She exhibits edema (trace amount around bilateral ankles). She exhibits no tenderness.  Neurological: She is alert and oriented to person, place, and time.  Skin: Skin is warm and dry.  Psychiatric: She has a normal mood and affect. Her behavior is normal. Thought content normal.  Nursing note and vitals reviewed.  Assessment & Plan:  1: Chronic heart failure with reduced ejection fraction- - NYHA class  II - euvolemic today - not adding salt to food. Reminded to follow a 2000 mg sodium diet  - weighing daily. Instructed to call for an overnight weight gain of >2 pounds or a weekly weight gain of >5  - weight up 4 pounds since she was last here - taking furosemide 40mg  AM/ 80mg  PM - patient aware that she will need a new echo. May be good to wait until she has been on Entresto for a few months - taking spironolactone 25 mg daily  - asks for a refill on her lisinopril. When told her that she wasn't supposed to be taking that in addition to entresto and that it had been stopped when entresto was begun, she says that she didn't realize that and continued to take lisinopril in addition to entresto - verbalizes understanding to not  take anymore lisinopril (could be cause of dry cough) - looks like entresto was started April 2018 and patient says that she's continued to take the lisinopril this whole time. Has had multiple BMP's drawn during these last few months with stable renal function so will not repeat it today - saw cardiology Sharolyn Douglas) on 04/26/17 & returns on 08/03/17  2: HTN- - BP on the low side today - hopefully it'll rise some after stopping lisinopril so that the entresto could be increased - getting established with [facility] on 07/08/17  3: Atrial fibrillation- - heart rate controlled at this time - AICD present but denies shocks - continue warfarin and metoprolol succinate  - follows with EP Caryl Comes) and was last seen by him 12/07/16  Patient did not bring her medications nor a list. Each medication was verbally reviewed with the patient and she was encouraged to bring the bottles to every visit to confirm accuracy of list. Emphasized the importance of bringing all bottles every time to make sure she is taking what we think she is taking and stopping what she is supposed to stop.   Return in 3 months or sooner for any questions/problems before then.

## 2017-06-06 NOTE — Patient Instructions (Signed)
Continue weighing daily and call for an overnight weight gain of > 2 pounds or a weekly weight gain of >5 pounds. 

## 2017-06-14 ENCOUNTER — Ambulatory Visit (INDEPENDENT_AMBULATORY_CARE_PROVIDER_SITE_OTHER): Payer: Medicare Other | Admitting: *Deleted

## 2017-06-14 DIAGNOSIS — I255 Ischemic cardiomyopathy: Secondary | ICD-10-CM

## 2017-06-14 DIAGNOSIS — I5022 Chronic systolic (congestive) heart failure: Secondary | ICD-10-CM | POA: Diagnosis not present

## 2017-06-14 DIAGNOSIS — Z95810 Presence of automatic (implantable) cardiac defibrillator: Secondary | ICD-10-CM

## 2017-06-14 NOTE — Progress Notes (Signed)
EPIC Encounter for ICM Monitoring  Patient Name: Vanessa Carlson is a 56 y.o. female Date: 06/14/2017 Primary Care Physican: [facility], [place] Primary Cardiologist: Carlis Abbott, NP HF Electrophysiologist: Caryl Comes Dry OFHQRF:758 lbs         Heart Failure questions reviewed, pt asymptomatic.   Thoracic impedance normal.  Prescribed dosage: Furosemide 40 mg 1 tablet 3 times daily.   Labs: 03/23/2017 Creatinine 0.87, BUN 9, Potassium 3.7, Sodium 138, EGFR >60 02/23/2017 Creatinine 1.04, BUN 13, Potassium 3.8, Sodium 135, EGFR 58->60  02/08/2017 Creatinine 0.87, BUN 8, Potassium 3.2, Sodium 139, EGFR >60  01/28/2017 Creatinine 0.89, BUN 10, Potassium 3.6, Sodium 139, EGFR >60 12/31/2016 Creatinine 0.88, BUN 11, Potassium 3.2, Sodium 142, EGFR >60*  Recommendations: No changes.   Encouraged to call for fluid symptoms.  Follow-up plan: ICM clinic phone appointment on 07/15/2017.  Office appointment scheduled 08/03/2017 with Dr. Saunders Revel.  Copy of ICM check sent to Dr. Caryl Comes.   3 month ICM trend: 06/14/2017   1 Year ICM trend:      [name], RN 06/14/2017 4:57 PM

## 2017-06-14 NOTE — Progress Notes (Signed)
Remote ICD transmission.   

## 2017-06-15 ENCOUNTER — Other Ambulatory Visit: Payer: Self-pay | Admitting: Family

## 2017-06-16 LAB — CUP PACEART REMOTE DEVICE CHECK
Battery Voltage: 3.02 V
Brady Statistic RV Percent Paced: 0 %
Date Time Interrogation Session: 20181016042203
HIGH POWER IMPEDANCE MEASURED VALUE: 77 Ohm
Implantable Lead Location: 753860
Lead Channel Impedance Value: 342 Ohm
Lead Channel Impedance Value: 437 Ohm
Lead Channel Pacing Threshold Pulse Width: 0.4 ms
Lead Channel Sensing Intrinsic Amplitude: 10.625 mV
Lead Channel Sensing Intrinsic Amplitude: 10.625 mV
Lead Channel Setting Pacing Amplitude: 2 V
MDC IDC LEAD IMPLANT DT: 20160802
MDC IDC MSMT BATTERY REMAINING LONGEVITY: 124 mo
MDC IDC MSMT LEADCHNL RV PACING THRESHOLD AMPLITUDE: 0.75 V
MDC IDC PG IMPLANT DT: 20160802
MDC IDC SET LEADCHNL RV PACING PULSEWIDTH: 0.4 ms
MDC IDC SET LEADCHNL RV SENSING SENSITIVITY: 0.3 mV

## 2017-06-17 ENCOUNTER — Encounter: Payer: Self-pay | Admitting: Cardiology

## 2017-07-01 ENCOUNTER — Other Ambulatory Visit: Payer: Self-pay

## 2017-07-05 ENCOUNTER — Telehealth: Payer: Self-pay | Admitting: Internal Medicine

## 2017-07-05 NOTE — Telephone Encounter (Signed)
°*  STAT* If patient is at the pharmacy, call can be transferred to refill team.   1. Which medications need to be refilled? (please list name of each medication and dose if known)  warfarin   2. Which pharmacy/location (including street and city if local pharmacy) is medication to be sent to? [facility]   3. Do they need a 30 day or 90 day supply? 30 day

## 2017-07-06 NOTE — Telephone Encounter (Signed)
Spoke w/ pt.  She reports that her PCP manages her coumadin and does INR checks. Her last INR check was ~1 month ago. She has an appt on Friday. Advised her that since they are managing INRs, they will need to authorize refills on her coumadin. Advised her that I will be more than happy to est her in the coumadin clinic here in our office, and her to to discuss w/ PCP and let me know if she would like an appt here on Mondays or Wednesdays.  She is appreciative of the call.

## 2017-07-08 ENCOUNTER — Encounter: Payer: Self-pay | Admitting: Family Medicine

## 2017-07-08 ENCOUNTER — Other Ambulatory Visit: Payer: Self-pay

## 2017-07-08 ENCOUNTER — Ambulatory Visit (INDEPENDENT_AMBULATORY_CARE_PROVIDER_SITE_OTHER): Payer: Medicare Other | Admitting: Family Medicine

## 2017-07-08 VITALS — BP 122/76 | HR 76 | Temp 98.6°F | Resp 16 | Wt 215.0 lb

## 2017-07-08 DIAGNOSIS — Z7901 Long term (current) use of anticoagulants: Secondary | ICD-10-CM | POA: Diagnosis not present

## 2017-07-08 DIAGNOSIS — M1710 Unilateral primary osteoarthritis, unspecified knee: Secondary | ICD-10-CM | POA: Insufficient documentation

## 2017-07-08 DIAGNOSIS — I5022 Chronic systolic (congestive) heart failure: Secondary | ICD-10-CM | POA: Diagnosis not present

## 2017-07-08 DIAGNOSIS — Z952 Presence of prosthetic heart valve: Secondary | ICD-10-CM | POA: Diagnosis not present

## 2017-07-08 DIAGNOSIS — M179 Osteoarthritis of knee, unspecified: Secondary | ICD-10-CM | POA: Insufficient documentation

## 2017-07-08 DIAGNOSIS — I480 Paroxysmal atrial fibrillation: Secondary | ICD-10-CM

## 2017-07-08 DIAGNOSIS — I10 Essential (primary) hypertension: Secondary | ICD-10-CM

## 2017-07-08 DIAGNOSIS — Z1159 Encounter for screening for other viral diseases: Secondary | ICD-10-CM

## 2017-07-08 DIAGNOSIS — M170 Bilateral primary osteoarthritis of knee: Secondary | ICD-10-CM | POA: Diagnosis not present

## 2017-07-08 DIAGNOSIS — Z23 Encounter for immunization: Secondary | ICD-10-CM

## 2017-07-08 DIAGNOSIS — I25118 Atherosclerotic heart disease of native coronary artery with other forms of angina pectoris: Secondary | ICD-10-CM | POA: Diagnosis not present

## 2017-07-08 DIAGNOSIS — I255 Ischemic cardiomyopathy: Secondary | ICD-10-CM

## 2017-07-08 DIAGNOSIS — Z7689 Persons encountering health services in other specified circumstances: Secondary | ICD-10-CM | POA: Diagnosis not present

## 2017-07-08 MED ORDER — TRAMADOL HCL 50 MG PO TABS: 50.0000 mg | ORAL_TABLET | Freq: Four times a day (QID) | ORAL | 5 refills | Status: DC | PRN

## 2017-07-08 NOTE — Assessment & Plan Note (Signed)
Suspect primary osteo-arthritis Avoid NSAIDs given significant heart disease Okay to continue very small dose tramadol Get standing knee x-rays for baseline Could consider steroid injections in the future

## 2017-07-08 NOTE — Assessment & Plan Note (Signed)
Rate controlled currently Managed by cardiology as well as EP Continue metoprolol, Coumadin

## 2017-07-08 NOTE — Assessment & Plan Note (Signed)
Managed by cardiology as well as heart failure clinic She is taking beta blocker, Entresto, diuretics as prescribed She is not currently taking her Lipitor, which she states the cardiology stopped. I cannot find in her chart that this was stopped, or reason for her to be stop Advised patient to call the cardiology clinic and find out if she is supposed to be taking this medication Given her need for secondary prevention,I would recommend that she resumes this, but she will check with cardiology first

## 2017-07-08 NOTE — Assessment & Plan Note (Signed)
Medically managed Denies chest pain or shortness of breath today Followed by cardiology Continue  Current medications Check labs

## 2017-07-08 NOTE — Assessment & Plan Note (Signed)
Significantly reduced ejection fraction and diastolic dysfunction Reviewed recent echocardiogram AICD in place Continue to follow with cardiology and EP and heart failure clinic Would recommend statin as above

## 2017-07-08 NOTE — Addendum Note (Signed)
Addended by: Wilburt Finlay L on: 07/08/2017 11:40 AM   Modules accepted: Orders

## 2017-07-08 NOTE — Progress Notes (Signed)
Patient: Vanessa Carlson Female    DOB: 18-Dec-1960   56 y.o.   MRN: [id number] Visit Date: 07/08/2017  Today's Provider: Lavon Paganini, MD   Chief Complaint  Patient presents with  . New Patient (Initial Visit)  . Arthritis   Subjective:    Patient comes in today to establish care. She feels well today. However, she reports that she has recurrent joint pain.  Arthritis  She complains of pain and joint swelling. The symptoms have been worsening. Associated symptoms include fatigue.  Patient reports that she normally takes tramadol to help with the pain, but she has been out of medication for quite sometime.  She was prescribed Tramadol 50mg  q6h prn.  As only take 1/2 pill daily Mostly in b/l knees Has not been told that she has arthritis in knees previously Worse with rain and climbing stairs  CAD, s/p MVR, ischemic cardiomyopathy, h/o STEMI, combined CHF, HTN: Followed by Cardiology and Heart Failure Clinic and EP (Dr. Caryl Comes).  AICD in place.  Mitral valve was replaced with mechanical valve in 2006. Patient is on chronic Coumadin therapy as well as aspirin. She had  aSTEMI in 2016 Cardiac cath revealed 100% occlusion of LAD. She's medically managed. Her last echocardiogram was in 10/2015 that showed ejection fraction of 81-85%, severe diastolic dysfunction , mildly reduced RV function.  Coumadin dose currently is 7.5mg  TTSS, 8.5mg  on MWF - managed by previous PCP - last INR 3.2 on 05/07/17  States that Lipitor was held by Cardiology a few months ago    No Known Allergies   Current Outpatient Medications:  .  aspirin 81 MG chewable tablet, Chew 81 mg by mouth daily., Disp: , Rfl:  .  atorvastatin (LIPITOR) 80 MG tablet, Take 80 mg by mouth daily., Disp: , Rfl:  .  fluticasone (FLONASE) 50 MCG/ACT nasal spray, Place 2 sprays into both nostrils as needed for allergies or rhinitis., Disp: , Rfl:  .  furosemide (LASIX) 40 MG tablet, TAKE 1 TABLET BY MOUTH THREE TIMES DAILY,  Disp: 270 tablet, Rfl: 3 .  metoprolol succinate (TOPROL-XL) 50 MG 24 hr tablet, Take 1/2 tablet (25 mg) by mouth two times a day. Take with or immediately following a meal., Disp: 90 tablet, Rfl: 3 .  nitroGLYCERIN (NITROSTAT) 0.3 MG SL tablet, Place 1 tablet (0.3 mg total) under the tongue every 5 (five) minutes as needed for chest pain., Disp: 30 tablet, Rfl: 3 .  sacubitril-valsartan (ENTRESTO) 24-26 MG, Take 1 tablet by mouth 2 (two) times daily., Disp: 60 tablet, Rfl: 5 .  spironolactone (ALDACTONE) 25 MG tablet, Take 25 mg by mouth daily., Disp: , Rfl:  .  traMADol (ULTRAM) 50 MG tablet, Take 1 tablet (50 mg total) every 6 (six) hours as needed by mouth., Disp: 30 tablet, Rfl: 5 .  warfarin (COUMADIN) 1 MG tablet, 9 mg as directed. Take 9 mg on Tues, Thurs, Sun and 8 mg on all other days, Disp: , Rfl:  .  warfarin (COUMADIN) 2.5 MG tablet, 8 mg. 8 mg on Mon, Wed, Fri, and Sat 9 mg on Tues, Thurs, Sun, Disp: , Rfl:   Review of Systems  Constitutional: Positive for fatigue.  Cardiovascular: Positive for leg swelling.  Endocrine: Negative.   Musculoskeletal: Positive for arthralgias, arthritis, back pain, joint swelling and myalgias.  Skin: Negative.   Neurological: Negative.   Psychiatric/Behavioral: Negative.   All other systems reviewed and are negative.  Past Medical History:  Diagnosis Date  .  Anterior myocardial infarction [facility])    a. 10/2014 - occluded LAD, complicated by cardiogenic shock-->Med Rx as interventional team was unable to open LAD.  [name] Cervical cancer ([place])    in remission for ~20 yrs, s/p hysterectomy  . Chronic combined systolic and diastolic CHF (congestive heart failure) ([place])    a. 10/2015 Echo: EF 20-25%, sev diast dysfxn, mildly reduced RV fxn. Nl MV prosthesis.  . Coronary artery disease    a. 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  . Essential hypertension   .  Ischemic cardiomyopathy    a. 03/2015 s/p MDT single lead AICD;  b. 10/2015 Echo: EF 20-25%.  . Mitral valve disease    a. 2006 s/p MVR w/ 47mm SJM bileaflet mechanical valve-->chronic coumadin/ASA.   Past Surgical History:  Procedure Laterality Date  . APPENDECTOMY  1998  . CARDIAC CATHETERIZATION    . CARDIAC DEFIBRILLATOR PLACEMENT    . RADICAL HYSTERECTOMY  1998  . SHOULDER SURGERY Bilateral    rotator cuff tears  . VALVE REPLACEMENT     Mitral valve; 27 mm St. Jude bileaflet valve   Family History  Problem Relation Age of Onset  . Heart attack Mother   . Hypertension Mother   . Diabetes Mother   . Emphysema Father        smoker  . Diabetes Maternal Grandmother   . Kidney disease Maternal Grandmother   . Breast cancer Maternal Aunt   . Breast cancer Maternal Aunt   . Colon cancer Maternal Uncle     Social History   Tobacco Use  . Smoking status: Former Smoker    Packs/day: 1.00    Years: 4.00    Pack years: 4.00    Types: Cigarettes    Last attempt to quit: 1998    Years since quitting: 20.8  . Smokeless tobacco: Never Used  Substance Use Topics  . Alcohol use: No   Objective:   BP 122/76   Pulse 76   Temp 98.6 F (37 C)   Resp 16   Wt 215 lb (97.5 kg)   LMP  (LMP Unknown)   SpO2 98%   BMI 39.32 kg/m  Vitals:   07/08/17 1009  BP: 122/76  Pulse: 76  Resp: 16  Temp: 98.6 F (37 C)  SpO2: 98%  Weight: 215 lb (97.5 kg)     Physical Exam  Constitutional: She is oriented to person, place, and time. She appears well-developed and well-nourished. No distress.  HENT:  Head: Normocephalic and atraumatic.  Right Ear: External ear normal.  Left Ear: External ear normal.  Nose: Nose normal.  Mouth/Throat: Oropharynx is clear and moist. No oropharyngeal exudate.  Eyes: Conjunctivae and EOM are normal. Pupils are equal, round, and reactive to light. No scleral icterus.  Neck: Neck supple. No thyromegaly present.  Cardiovascular: Normal rate, regular  rhythm and intact distal pulses.  + mechanical click  Pulmonary/Chest: Effort normal and breath sounds normal. No respiratory distress. She has no wheezes. She has no rales.  Abdominal: Soft. Bowel sounds are normal. She exhibits no distension. There is no tenderness. There is no rebound and no guarding.  Musculoskeletal: She exhibits no edema or deformity.  Knee exam b/l: Normal ROM, strength, TTP over lateral joint line of left knee. Otherwise no tenderness to palpation.Positive crepitus. No deformities on inspection or palpation  Lymphadenopathy:    She has no cervical adenopathy.  Neurological: She  is alert and oriented to person, place, and time.  Skin: Skin is warm and dry. No rash noted.  Psychiatric: She has a normal mood and affect. Her behavior is normal.  Vitals reviewed.       Assessment & Plan:      Problem List Items Addressed This Visit      Cardiovascular and Mediastinum   Chronic systolic heart failure (HCC) (Chronic)    Significantly reduced ejection fraction and diastolic dysfunction Reviewed recent echocardiogram AICD in place Continue to follow with cardiology and EP and heart failure clinic Would recommend statin as above      HTN (hypertension) (Chronic)    Well-controlled Continue current medications Check labs      Coronary artery disease of native artery of native heart with stable angina pectoris ([place])    Medically managed Denies chest pain or shortness of breath today Followed by cardiology Continue  Current medications Check labs      Relevant Orders   Lipid panel   Comprehensive metabolic panel   Ischemic cardiomyopathy    Managed by cardiology as well as heart failure clinic She is taking beta blocker, Entresto, diuretics as prescribed She is not currently taking her Lipitor, which she states the cardiology stopped. I cannot find in her chart that this was stopped, or reason for her to be stop Advised patient to call the cardiology  clinic and find out if she is supposed to be taking this medication Given her need for secondary prevention,I would recommend that she resumes this, but she will check with cardiology first      Relevant Orders   Lipid panel   Comprehensive metabolic panel   Paroxysmal atrial fibrillation ([place])    Rate controlled currently Managed by cardiology as well as EP Continue metoprolol, Coumadin        Musculoskeletal and Integument   OA (osteoarthritis) of knee    Suspect primary osteo-arthritis Avoid NSAIDs given significant heart disease Okay to continue very small dose tramadol Get standing knee x-rays for baseline Could consider steroid injections in the future      Relevant Medications   traMADol (ULTRAM) 50 MG tablet   Other Relevant Orders   DG Knee Bilateral Standing AP     Other   Status post mitral valve replacement    Doing well, asymptomatic Functioning normally on her last echocardiogram Continue chronic Coumadin therapy with goal INR 2.5-3.5 Check INR today and dose adjust as indicated Pending results, will set up patient with INR clinic in 2-4 weeks      Relevant Orders   INR/PT   Chronic anticoagulation    Check INR as above, dose adjust as indicated Pending results, will set up patient with INR clinic in 2-4 weeks      Relevant Orders   CBC   INR/PT    Other Visit Diagnoses    Encounter to establish care    -  Primary   Need for hepatitis C screening test       Relevant Orders   Hepatitis C Antibody      Return in about 6 months (around 01/05/2018) for CPE.     The entirety of the information documented in the History of Present Illness, Review of Systems and Physical Exam were personally obtained by me. Portions of this information were initially documented by Wilburt Finlay, CMA and reviewed by me for thoroughness and accuracy.     Lavon Paganini, MD  [facility]

## 2017-07-08 NOTE — Patient Instructions (Signed)

## 2017-07-08 NOTE — Progress Notes (Signed)
error 

## 2017-07-08 NOTE — Assessment & Plan Note (Signed)
Doing well, asymptomatic Functioning normally on her last echocardiogram Continue chronic Coumadin therapy with goal INR 2.5-3.5 Check INR today and dose adjust as indicated Pending results, will set up patient with INR clinic in 2-4 weeks

## 2017-07-08 NOTE — Assessment & Plan Note (Signed)
Check INR as above, dose adjust as indicated Pending results, will set up patient with INR clinic in 2-4 weeks

## 2017-07-08 NOTE — Assessment & Plan Note (Signed)
Well-controlled Continue current medications Check labs

## 2017-07-09 LAB — COMPLETE METABOLIC PANEL WITH GFR
AG Ratio: 1.2 (calc) (ref 1.0–2.5)
ALBUMIN MSPROF: 4.3 g/dL (ref 3.6–5.1)
ALKALINE PHOSPHATASE (APISO): 78 U/L (ref 33–130)
ALT: 12 U/L (ref 6–29)
AST: 18 U/L (ref 10–35)
BUN / CREAT RATIO: 12 (calc) (ref 6–22)
BUN: 14 mg/dL (ref 7–25)
CALCIUM: 9.8 mg/dL (ref 8.6–10.4)
CO2: 33 mmol/L — AB (ref 20–32)
CREATININE: 1.14 mg/dL — AB (ref 0.50–1.05)
Chloride: 101 mmol/L (ref 98–110)
GFR, EST NON AFRICAN AMERICAN: 53 mL/min/{1.73_m2} — AB (ref >=60)
GFR, Est African American: 62 mL/min/{1.73_m2} (ref >=60)
GLUCOSE: 95 mg/dL (ref 65–139)
Globulin: 3.5 g/dL (calc) (ref 1.9–3.7)
Potassium: 4.2 mmol/L (ref 3.5–5.3)
Sodium: 140 mmol/L (ref 135–146)
Total Bilirubin: 1 mg/dL (ref 0.2–1.2)
Total Protein: 7.8 g/dL (ref 6.1–8.1)

## 2017-07-09 LAB — CBC
HCT: 40 % (ref 35.0–45.0)
Hemoglobin: 13.5 g/dL (ref 11.7–15.5)
MCH: 28.7 pg (ref 27.0–33.0)
MCHC: 33.8 g/dL (ref 32.0–36.0)
MCV: 84.9 fL (ref 80.0–100.0)
MPV: 12.3 fL (ref 7.5–12.5)
PLATELETS: 138 10*3/uL — AB (ref 140–400)
RBC: 4.71 10*6/uL (ref 3.80–5.10)
RDW: 14.1 % (ref 11.0–15.0)
WBC: 5.6 10*3/uL (ref 3.8–10.8)

## 2017-07-09 LAB — PROTIME-INR
INR: 1.1
PROTHROMBIN TIME: 11.2 s (ref 9.0–11.5)

## 2017-07-09 LAB — LIPID PANEL
CHOLESTEROL: 133 mg/dL (ref <200)
HDL: 47 mg/dL — ABNORMAL LOW (ref >50)
LDL CHOLESTEROL (CALC): 66 mg/dL
Non-HDL Cholesterol (Calc): 86 mg/dL (calc) (ref <130)
TRIGLYCERIDES: 121 mg/dL (ref <150)
Total CHOL/HDL Ratio: 2.8 (calc) (ref <5.0)

## 2017-07-09 LAB — HEPATITIS C ANTIBODY
Hepatitis C Ab: NONREACTIVE
SIGNAL TO CUT-OFF: 0.01 (ref <1.00)

## 2017-07-11 ENCOUNTER — Other Ambulatory Visit: Payer: Self-pay | Admitting: Family Medicine

## 2017-07-11 ENCOUNTER — Telehealth: Payer: Self-pay

## 2017-07-11 MED ORDER — ENOXAPARIN SODIUM 100 MG/ML ~~LOC~~ SOLN: 100.0000 mg | Freq: Two times a day (BID) | SUBCUTANEOUS | 0 refills | Status: DC

## 2017-07-11 MED ORDER — WARFARIN SODIUM 1 MG PO TABS: 1.0000 mg | ORAL_TABLET | ORAL | 11 refills | Status: DC

## 2017-07-11 NOTE — Telephone Encounter (Signed)
Pt advised and agrees with treatment plan. Coumadin sent to pharmacy. Appointment made for shoulder pain.

## 2017-07-11 NOTE — Telephone Encounter (Signed)
-----   Message from Virginia Crews, MD sent at 07/11/2017  8:59 AM EST ----- Actually patient also needs lovenox coverage while awaiting normal INR.  This is a shot twice daily for 1 week.  We will reassess need after next INR check on Friday.  Virginia Crews, MD, MPH [facility] 07/11/2017 8:58 AM

## 2017-07-11 NOTE — Telephone Encounter (Signed)
Pt advised. States she has not been taking Coumadin as prescribed. She is only taking 7.5 mg qd. She needs a refill of the 1 mg Coumadin. OK to send in? Pt also asking if she needs Lovenox as she was not taking the medication as prescribed. She is also asking for an order for right shoulder xray. States, "I can barely move my arm".

## 2017-07-11 NOTE — Telephone Encounter (Signed)
Ok.  We can change dose of coumadin to what it was previously reported as then.  7.5mg  daily, except 8.5mg  MWF.  OK to refill coumadin.  She does need the lovenox as this will protect against blood clots while waiting for coumadin to thin blood.  She is high risk given her valve replacement.  She will need an appt for shoulder pain as we did not discuss this.  Virginia Crews, MD, MPH [facility] 07/11/2017 11:08 AM

## 2017-07-12 ENCOUNTER — Ambulatory Visit (INDEPENDENT_AMBULATORY_CARE_PROVIDER_SITE_OTHER): Payer: Medicare Other | Admitting: Family Medicine

## 2017-07-12 ENCOUNTER — Ambulatory Visit
Admission: RE | Admit: 2017-07-12 | Discharge: 2017-07-12 | Disposition: A | Discharge status: Home / Self Care | Payer: Medicare Other | Source: Ambulatory Visit | Attending: Family Medicine | Admitting: Family Medicine

## 2017-07-12 ENCOUNTER — Encounter: Payer: Self-pay | Admitting: Family Medicine

## 2017-07-12 DIAGNOSIS — Z950 Presence of cardiac pacemaker: Secondary | ICD-10-CM | POA: Insufficient documentation

## 2017-07-12 DIAGNOSIS — M25511 Pain in right shoulder: Secondary | ICD-10-CM | POA: Diagnosis not present

## 2017-07-12 DIAGNOSIS — G8929 Other chronic pain: Secondary | ICD-10-CM

## 2017-07-12 DIAGNOSIS — M170 Bilateral primary osteoarthritis of knee: Secondary | ICD-10-CM | POA: Diagnosis not present

## 2017-07-12 DIAGNOSIS — M89321 Hypertrophy of bone, right humerus: Secondary | ICD-10-CM | POA: Insufficient documentation

## 2017-07-12 NOTE — Assessment & Plan Note (Signed)
C/w subacromial bursitis with some impingement signs Suspect there is likely some OA in [facility] joint as well Rotator cuff motion and strength intake HEP given Avoid NSAIDs given anticoagulation and CHF Tylenol, ice for pain Shoulder XRays ordered Offered subacromial steroid injection - patient wishes to try more conservative measures first and will f/u if she desires this in the future

## 2017-07-12 NOTE — Progress Notes (Signed)
Patient: Vanessa Carlson Female    DOB: 1961-04-22   56 y.o.   MRN: [id number] Visit Date: 07/12/2017  Today's Provider: Lavon Paganini, MD   Chief Complaint  Patient presents with  . Shoulder Pain   Subjective:    Shoulder Pain   The pain is present in the right shoulder. Episode onset: has been present for several months; has been worsening in the last few days. There has been a history of trauma (surgery from torn rotator cuff in 2007.). The problem occurs constantly. The problem has been gradually worsening. The quality of the pain is described as sharp. The pain is at a severity of 5/10. The pain is moderate. Associated symptoms include a limited range of motion, numbness, stiffness and tingling. Pertinent negatives include no fever, inability to bear weight, itching, joint locking or joint swelling. Associated symptoms comments: Crepitus is present. Exacerbated by: movement. The treatment provided no relief.   Slept on air mattress 2-3 days ago.  Woke up in the morning with pain in R shoulder that radiated down arm.  Has chronic, intermittent R shoulder pain for last several months.  She has not tried any medications.  Denies fevers, swelling, redness, weakness, loss of range of motion.  Certain movements make it worse.    No Known Allergies   Current Outpatient Medications:  .  aspirin 81 MG chewable tablet, Chew 81 mg by mouth daily., Disp: , Rfl:  .  atorvastatin (LIPITOR) 80 MG tablet, Take 80 mg by mouth daily., Disp: , Rfl:  .  furosemide (LASIX) 40 MG tablet, TAKE 1 TABLET BY MOUTH THREE TIMES DAILY, Disp: 270 tablet, Rfl: 3 .  metoprolol succinate (TOPROL-XL) 50 MG 24 hr tablet, Take 1/2 tablet (25 mg) by mouth two times a day. Take with or immediately following a meal., Disp: 90 tablet, Rfl: 3 .  nitroGLYCERIN (NITROSTAT) 0.3 MG SL tablet, Place 1 tablet (0.3 mg total) under the tongue every 5 (five) minutes as needed for chest pain., Disp: 30 tablet, Rfl: 3 .   sacubitril-valsartan (ENTRESTO) 24-26 MG, Take 1 tablet by mouth 2 (two) times daily., Disp: 60 tablet, Rfl: 5 .  spironolactone (ALDACTONE) 25 MG tablet, Take 25 mg by mouth daily., Disp: , Rfl:  .  traMADol (ULTRAM) 50 MG tablet, Take 1 tablet (50 mg total) every 6 (six) hours as needed by mouth., Disp: 30 tablet, Rfl: 5 .  warfarin (COUMADIN) 1 MG tablet, Take 1 tablet (1 mg total) as directed by mouth., Disp: 30 tablet, Rfl: 11 .  warfarin (COUMADIN) 2.5 MG tablet, 8 mg. 8 mg on Mon, Wed, Fri, and Sat 9 mg on Tues, Thurs, Sun, Disp: , Rfl:  .  enoxaparin (LOVENOX) 100 MG/ML injection, Inject 1 mL (100 mg total) every 12 (twelve) hours for 7 days into the skin. (Patient not taking: Reported on 07/12/2017), Disp: 14 mL, Rfl: 0 .  fluticasone (FLONASE) 50 MCG/ACT nasal spray, Place 2 sprays into both nostrils as needed for allergies or rhinitis., Disp: , Rfl:   Review of Systems  Constitutional: Negative for fever.  Musculoskeletal: Positive for stiffness.  Skin: Negative for itching.  Neurological: Positive for tingling and numbness.    Social History   Tobacco Use  . Smoking status: Former Smoker    Packs/day: 1.00    Years: 4.00    Pack years: 4.00    Types: Cigarettes    Last attempt to quit: 1998    Years since quitting: 20.8  .  Smokeless tobacco: Never Used  Substance Use Topics  . Alcohol use: No   Objective:   BP 104/60 (BP Location: Left Arm, Patient Position: Sitting, Cuff Size: Large)   Pulse 72   Temp 98.2 F (36.8 C) (Oral)   Resp 16   Wt 216 lb (98 kg)   LMP  (LMP Unknown)   BMI 39.51 kg/m  Vitals:   07/12/17 0942  BP: 104/60  Pulse: 72  Resp: 16  Temp: 98.2 F (36.8 C)  TempSrc: Oral  Weight: 216 lb (98 kg)     Physical Exam  Constitutional: She is oriented to person, place, and time. She appears well-developed and well-nourished. No distress.  Cardiovascular: Normal rate, regular rhythm and normal heart sounds.  Pulmonary/Chest: Effort normal and  breath sounds normal. No respiratory distress. She has no wheezes. She has no rales.  Musculoskeletal: She exhibits no edema.  R Shoulder: Inspection reveals no abnormalities, atrophy or asymmetry.  TTP over [facility] joint. ROM is full in all planes, but mildly painful with external rotation. Rotator cuff strength normal throughout. + signs of impingement with + Hawkin's tests, empty can.  Neurological: She is alert and oriented to person, place, and time.  Skin: Skin is warm and dry. No rash noted.  Psychiatric: She has a normal mood and affect. Her behavior is normal.  Vitals reviewed.       Assessment & Plan:      Problem List Items Addressed This Visit      Other   Right shoulder pain    C/w subacromial bursitis with some impingement signs Suspect there is likely some OA in [facility] joint as well Rotator cuff motion and strength intake HEP given Avoid NSAIDs given anticoagulation and CHF Tylenol, ice for pain Shoulder XRays ordered Offered subacromial steroid injection - patient wishes to try more conservative measures first and will f/u if she desires this in the future      Relevant Orders   DG Shoulder Right      Return if symptoms worsen or fail to improve.      The entirety of the information documented in the History of Present Illness, Review of Systems and Physical Exam were personally obtained by me. Portions of this information were initially documented by Raquel Sarna Ratchford, CMA and reviewed by me for thoroughness and accuracy.     Lavon Paganini, MD  [facility]

## 2017-07-13 ENCOUNTER — Telehealth: Payer: Self-pay

## 2017-07-13 NOTE — Telephone Encounter (Signed)
Pt advised.

## 2017-07-13 NOTE — Telephone Encounter (Signed)
-----   Message from Virginia Crews, MD sent at 07/13/2017  8:12 AM EST ----- Mild arthritis bilaterally.  See also results for shoulder Cleda Mccreedy, Dionne Bucy, MD, MPH [facility] 07/13/2017 8:12 AM

## 2017-07-13 NOTE — Telephone Encounter (Signed)
Pt advised and agrees with treatment plan. 

## 2017-07-13 NOTE — Telephone Encounter (Signed)
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  Treatment as we discussed.  Can consider steroid injection if not getting better.  Virginia Crews, MD, MPH [facility] 07/13/2017 8:12 AM

## 2017-07-15 ENCOUNTER — Telehealth: Payer: Self-pay

## 2017-07-15 ENCOUNTER — Ambulatory Visit (INDEPENDENT_AMBULATORY_CARE_PROVIDER_SITE_OTHER): Payer: Medicare Other

## 2017-07-15 DIAGNOSIS — I5022 Chronic systolic (congestive) heart failure: Secondary | ICD-10-CM

## 2017-07-15 DIAGNOSIS — I480 Paroxysmal atrial fibrillation: Secondary | ICD-10-CM | POA: Diagnosis not present

## 2017-07-15 DIAGNOSIS — Z95810 Presence of automatic (implantable) cardiac defibrillator: Secondary | ICD-10-CM

## 2017-07-15 NOTE — Telephone Encounter (Signed)
Pt comes to check PT/INR. Still out of POCT INR strips; printed lab slip for [facility].

## 2017-07-15 NOTE — Progress Notes (Signed)
EPIC Encounter for ICM Monitoring  Patient Name: Vanessa Carlson is a 56 y.o. female Date: 07/15/2017 Primary Care Physican: Virginia Crews, MD Primary Cardiologist: Carlis Abbott, NP HF Electrophysiologist: Faustino Congress CWCBJS:283 lbs        Heart Failure questions reviewed, pt asymptomatic.   Thoracic impedance normal.  Prescribed dosage: Furosemide 40 mg 1 tablet 3 times daily.   Labs: 07/08/2017 Creatinine 1.14, BUN 14, Potassium 4.2, Sodium 140, EGFR 53-62 03/23/2017 Creatinine 0.87, BUN 9, Potassium 3.7, Sodium 138, EGFR >60 02/23/2017 Creatinine 1.04, BUN 13, Potassium 3.8, Sodium 135, EGFR 58->60  02/08/2017 Creatinine 0.87, BUN 8, Potassium 3.2, Sodium 139, EGFR >60  01/28/2017 Creatinine 0.89, BUN 10, Potassium 3.6, Sodium 139, EGFR >60 12/31/2016 Creatinine 0.88, BUN 11, Potassium 3.2, Sodium 142, EGFR >60*  Recommendations: No changes.  Encouraged to call for fluid symptoms.  Follow-up plan: ICM clinic phone appointment on 08/15/2017.  Office appointment scheduled 08/03/2017 with Dr. Saunders Revel.  Copy of ICM check sent to Dr. Caryl Comes.   3 month ICM trend: 07/15/2017    1 Year ICM trend:       [name], RN 07/15/2017 8:29 AM

## 2017-07-16 ENCOUNTER — Telehealth: Payer: Self-pay

## 2017-07-16 LAB — PROTIME-INR
INR: 2.5 — AB
Prothrombin Time: 25.8 s — ABNORMAL HIGH (ref 9.0–11.5)

## 2017-07-16 NOTE — Telephone Encounter (Signed)
Pt advised.   Thanks,   -Sky Borboa  

## 2017-07-16 NOTE — Telephone Encounter (Signed)
-----   Message from Virginia Crews, MD sent at 07/16/2017  9:12 AM EST ----- INR is 2.5 which is therapeutic (lower end of range, 2.5-3.5).  Ok to stop lovenox.  Continue coumadin at current dose as discussed last time.  Recheck in 2 weeks (can we schedule her with PT clinic?)  Bacigalupo, Dionne Bucy, MD, MPH [facility] 07/16/2017 9:12 AM

## 2017-08-01 ENCOUNTER — Telehealth: Payer: Self-pay

## 2017-08-01 DIAGNOSIS — I480 Paroxysmal atrial fibrillation: Secondary | ICD-10-CM | POA: Diagnosis not present

## 2017-08-01 NOTE — Telephone Encounter (Signed)
Pt walks in for PT/INR check. Ordered labs for [facility].

## 2017-08-02 ENCOUNTER — Telehealth: Payer: Self-pay

## 2017-08-02 LAB — PROTIME-INR
INR: 3.2 — ABNORMAL HIGH
PROTHROMBIN TIME: 33.4 s — AB (ref 9.0–11.5)

## 2017-08-02 NOTE — Telephone Encounter (Signed)
Pt advised. Next INR scheduled.

## 2017-08-02 NOTE — Telephone Encounter (Signed)
-----   Message from Virginia Crews, MD sent at 08/02/2017  9:08 AM EST ----- INR is in range at 3.2 (goal 2.5-3.5).  Continue current coumadin dose.  Would like her scheduled for f/u in PT clinic in 1 month for recheck.  Virginia Crews, MD, MPH [facility] 08/02/2017 9:08 AM

## 2017-08-03 ENCOUNTER — Ambulatory Visit (INDEPENDENT_AMBULATORY_CARE_PROVIDER_SITE_OTHER): Payer: Medicare Other | Admitting: Internal Medicine

## 2017-08-03 ENCOUNTER — Encounter: Payer: Self-pay | Admitting: Internal Medicine

## 2017-08-03 VITALS — BP 90/58 | HR 69 | Ht 62.5 in | Wt 216.2 lb

## 2017-08-03 DIAGNOSIS — I255 Ischemic cardiomyopathy: Secondary | ICD-10-CM | POA: Diagnosis not present

## 2017-08-03 DIAGNOSIS — Z952 Presence of prosthetic heart valve: Secondary | ICD-10-CM

## 2017-08-03 DIAGNOSIS — I10 Essential (primary) hypertension: Secondary | ICD-10-CM | POA: Diagnosis not present

## 2017-08-03 DIAGNOSIS — I480 Paroxysmal atrial fibrillation: Secondary | ICD-10-CM

## 2017-08-03 DIAGNOSIS — I099 Rheumatic heart disease, unspecified: Secondary | ICD-10-CM

## 2017-08-03 DIAGNOSIS — I2510 Atherosclerotic heart disease of native coronary artery without angina pectoris: Secondary | ICD-10-CM | POA: Diagnosis not present

## 2017-08-03 DIAGNOSIS — I5022 Chronic systolic (congestive) heart failure: Secondary | ICD-10-CM | POA: Diagnosis not present

## 2017-08-03 NOTE — Progress Notes (Signed)
Follow-up Outpatient Visit Date: 08/03/2017  Primary Care Provider: Virginia Crews, MD [address] [place] [address]  Chief Complaint: Follow-up chronic systolic heart failure and coronary artery disease  HPI:  [name] is a 56 y.o. 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  I last saw her in June, which time she noted occasional chest pain.  We increased metoprolol to 50 mg daily.  However, due to GI upset, she change this to 25 mg twice daily with resolution of her GI upset.  She did not have any more chest pain at her last visit with Ignacia Bayley, NP, in August.  No medication changes were made at that visit.  Today, [name] reports feeling well.  She denies chest pain and shortness of breath.  She has stable two-pillow orthopnea without PND.  She notes rare episodes of leg swelling.  Weight has been stable.  She denies palpitations and lightheadedness.  No ICD shocks that she has experienced.  She is trying to avoid salt.  She remains compliant with warfarin and aspirin.  INR is followed by her PCP.  She also uses prophylactic antibiotics before any dental procedures.  --------------------------------------------------------------------------------------------------  Cardiovascular History & Procedures: Cardiovascular Problems:  Coronary artery disease status post anterior STEMI (2016)  Ischemic cardiomyopathy with chronic systolic heart failure  Rheumatic heart disease status post mechanical mitral valve replacement (2006)  Risk Factors:  Known coronary artery disease and  hypertension  Cath/PCI:   LHC (2016, [place]): Reportedly proximal occlusion of the LAD, which could not be crossed with a wire.  CV Surgery:  Mitral valve replacement (2006, [facility]): 27 mm St. Jude bileaflet valve  EP Procedures and Devices:  ICD (04/01/15, [facility]): Medtronic single-chamber ICD  Non-Invasive Evaluation(s):  TTE (11/20/15): Normal LV size and wall thickness. LVEF 20-25% with severe diastolic dysfunction. Mildly reduced RV contraction. Normal MV prosthesis function.  Recent CV Pertinent Labs: Lab Results  Component Value Date   CHOL 133 07/08/2017   HDL 47 (L) 07/08/2017   TRIG 121 07/08/2017   CHOLHDL 2.8 07/08/2017   INR 3.2 (H) 08/01/2017   K 4.2 07/08/2017   BUN 14 07/08/2017   CREATININE 1.14 (H) 07/08/2017    Past medical and surgical history were reviewed and updated in EPIC.  Current Meds  Medication Sig  . aspirin 81 MG chewable tablet Chew 81 mg by mouth daily.  [name] atorvastatin (LIPITOR) 80 MG tablet Take 80 mg by mouth daily.  . fluticasone (FLONASE) 50 MCG/ACT nasal spray Place 2 sprays into both nostrils as needed for allergies or rhinitis.  . furosemide (LASIX) 40 MG tablet TAKE 1 TABLET BY MOUTH THREE TIMES DAILY  . metoprolol succinate (TOPROL-XL) 50 MG 24 hr tablet Take 1/2 tablet (25 mg) by mouth two times a day. Take with or immediately following a meal.  . nitroGLYCERIN (NITROSTAT) 0.3 MG SL tablet Place 1 tablet (0.3 mg total) under the tongue every 5 (five) minutes as needed for chest pain.  . sacubitril-valsartan (ENTRESTO) 24-26 MG Take 1 tablet by mouth 2 (two) times daily.  [name] spironolactone (ALDACTONE) 25 MG tablet Take 25 mg by mouth daily.  . traMADol (ULTRAM) 50 MG tablet Take 1 tablet (  50 mg total) every 6 (six) hours as needed by mouth.  . warfarin (COUMADIN) 1 MG tablet Take 1 tablet (1 mg total) as directed by mouth.  . warfarin (COUMADIN) 2.5 MG tablet 8 mg. 8 mg on Mon, Wed, Fri, and Sat 9 mg on Tues, Thurs, Sun     Allergies: Patient has no known allergies.  Social History   Socioeconomic History  . Marital status: Married    Spouse name: Not on file  . Number of children: Not on file  . Years of education: Not on file  . Highest education level: Not on file  Social Needs  . Financial resource strain: Not on file  . Food insecurity - worry: Not on file  . Food insecurity - inability: Not on file  . Transportation needs - medical: Not on file  . Transportation needs - non-medical: Not on file  Occupational History  . Not on file  Tobacco Use  . Smoking status: Former Smoker    Packs/day: 1.00    Years: 4.00    Pack years: 4.00    Types: Cigarettes    Last attempt to quit: 1998    Years since quitting: 20.9  . Smokeless tobacco: Never Used  Substance and Sexual Activity  . Alcohol use: No  . Drug use: No  . Sexual activity: Yes    Partners: Male    Birth control/protection: Surgical  Other Topics Concern  . Not on file  Social History Narrative  . Not on file    Family History  Problem Relation Age of Onset  . Heart attack Mother   . Hypertension Mother   . Diabetes Mother   . Emphysema Father        smoker  . Diabetes Maternal Grandmother   . Kidney disease Maternal Grandmother   . Breast cancer Maternal Aunt   . Breast cancer Maternal Aunt   . Colon cancer Maternal Uncle     Review of Systems: A 12-system review of systems was performed and was negative except as noted in the HPI.  --------------------------------------------------------------------------------------------------  Physical Exam: BP (!) 90/58 (BP Location: Left Arm, Patient Position: Sitting, Cuff Size: Large)   Pulse 69   Ht 5' 2.5" (1.588 m)   Wt 216 lb 4 oz (98.1 kg)   LMP  (LMP Unknown)   BMI 38.92 kg/m   General: Obese woman, seated comfortably in the exam room. HEENT: No conjunctival pallor or scleral icterus. Moist mucous membranes.  OP clear. Neck: Supple without lymphadenopathy,  thyromegaly, JVD, or HJR. Lungs: Normal work of breathing. Clear to auscultation bilaterally without wheezes or crackles. Heart: Regular rate and rhythm without murmurs, rubs, or gallops.  Crisp mechanical S1 noted.  Unable to assess PMI due to body habitus. Abd: Bowel sounds present. Soft, NT/ND.  Unable to assess HSM. Ext: No lower extremity edema. Skin: Warm and dry without rash.  EKG: Normal sinus rhythm with lateral Q waves and nonspecific T wave changes.  Lab Results  Component Value Date   WBC 5.6 07/08/2017   HGB 13.5 07/08/2017   HCT 40.0 07/08/2017   MCV 84.9 07/08/2017   PLT 138 (L) 07/08/2017    Lab Results  Component Value Date   NA 140 07/08/2017   K 4.2 07/08/2017   CL 101 07/08/2017   CO2 33 (H) 07/08/2017   BUN 14 07/08/2017   CREATININE 1.14 (H) 07/08/2017   GLUCOSE 95 07/08/2017   ALT 12 07/08/2017    Lab Results  Component Value Date   CHOL 133 07/08/2017   HDL 47 (L) 07/08/2017   TRIG 121 07/08/2017   CHOLHDL 2.8 07/08/2017  LDL 66  --------------------------------------------------------------------------------------------------  ASSESSMENT AND PLAN: Coronary artery disease without angina No symptoms to suggest worsening coronary insufficiency.  We will continue with secondary prevention, including aspirin, metoprolol, and atorvastatin.  Chronic systolic heart failure secondary to ischemic cardiomyopathy [name] appears euvolemic and well compensated with NYHA class I-II symptoms.  Weight has been stable.  We will continue current doses of furosemide, metoprolol succinate, Entresto, and spironolactone.  Borderline low blood pressure and heart rate preclude further uptitration at this time.  Labs last month showed normal potassium was stable to slightly increased creatinine.  I encouraged [name] to continue to limit her salt intake.  Continue routine ICD follow-up with the device clinic.  Rheumatic heart disease status post mechanical  mitral valve No evidence of valve dysfunction by symptoms and exam.  INR was therapeutic when last checked yesterday.  Continue with warfarin and low-dose aspirin as well as prophylactic antibiotics for any dental procedure.  Hypertension Blood pressure borderline low today, though [name] is a asymptomatic.  No further medication adjustments at this time.  Paroxysmal atrial fibrillation No symptoms to suggest recurrence.  EKG today demonstrates sinus rhythm.  Continue warfarin and metoprolol.  Follow-up: Return to clinic in 3 months.  [name], MD 08/03/2017 10:18 AM

## 2017-08-03 NOTE — Patient Instructions (Signed)
Medication Instructions:  Your physician recommends that you continue on your current medications as directed. Please refer to the Current Medication list given to you today.   Labwork: NONE  Testing/Procedures: NONE  Follow-Up: Your physician recommends that you schedule a follow-up appointment in: 3 MONTHS WITH DR END OR APP.    If you need a refill on your cardiac medications before your next appointment, please call your pharmacy.

## 2017-08-05 ENCOUNTER — Ambulatory Visit: Payer: Medicare Other

## 2017-08-09 ENCOUNTER — Other Ambulatory Visit: Payer: Self-pay | Admitting: Family Medicine

## 2017-08-09 NOTE — Telephone Encounter (Signed)
Patient needs refills on Coumadin called into [facility] on [address]. PATIENT IS TOTALLY OUT!

## 2017-08-10 MED ORDER — WARFARIN SODIUM 1 MG PO TABS: 1.0000 mg | ORAL_TABLET | ORAL | 2 refills | Status: DC

## 2017-08-10 MED ORDER — WARFARIN SODIUM 2.5 MG PO TABS: ORAL_TABLET | ORAL | 1 refills | Status: DC

## 2017-08-10 NOTE — Telephone Encounter (Signed)
Patient advised.

## 2017-08-10 NOTE — Telephone Encounter (Signed)
Just seeing this message as I was out of office yesterday.  Can we call and confirm what her current coumadin dose is?  Then I will be happy to send in refills.  Let's also confirm her next INR check is scheduled.  Virginia Crews, MD, MPH [facility] 08/10/2017 10:28 AM

## 2017-08-10 NOTE — Telephone Encounter (Signed)
Patient is requesting a refill on Warfarin 7.5 mg be sent to [facility]. Patient;s next INR check is scheduled for 08/31/17.

## 2017-08-10 NOTE — Telephone Encounter (Signed)
Patient's current Warfarin dose should be 7.5mg  daily, except 8.5mg  on MWF.  Refills sent to pharmacy  Daily Crate, Dionne Bucy, MD, MPH [facility] 08/10/2017 11:47 AM

## 2017-08-11 ENCOUNTER — Other Ambulatory Visit: Payer: Self-pay | Admitting: Family Medicine

## 2017-08-11 MED ORDER — WARFARIN SODIUM 7.5 MG PO TABS: ORAL_TABLET | ORAL | 1 refills | Status: DC

## 2017-08-11 MED ORDER — SPIRONOLACTONE 25 MG PO TABS: 25.0000 mg | ORAL_TABLET | Freq: Every day | ORAL | 3 refills | Status: DC

## 2017-08-11 NOTE — Telephone Encounter (Signed)
Pt has some questions about her coumidin 7.5 refill.    She went to [facility] to pick [facility] her rx's and the mg was incorrect to what she has been taking.  She also needs refill on her spironolactone 25mg   Pt's call back is [phone]  Thanks teri

## 2017-08-11 NOTE — Telephone Encounter (Signed)
RX sent to [facility].

## 2017-08-11 NOTE — Telephone Encounter (Signed)
Yes, please send in 90 day supply with 3 refills  Bacigalupo, Dionne Bucy, MD, MPH [facility] 08/11/2017 4:16 PM

## 2017-08-11 NOTE — Telephone Encounter (Signed)
Warfarin 7.5 mg sent to [facility]. Ok to send in spironolactone?

## 2017-08-15 ENCOUNTER — Ambulatory Visit (INDEPENDENT_AMBULATORY_CARE_PROVIDER_SITE_OTHER): Payer: Medicare Other

## 2017-08-15 DIAGNOSIS — Z95810 Presence of automatic (implantable) cardiac defibrillator: Secondary | ICD-10-CM

## 2017-08-15 DIAGNOSIS — I5022 Chronic systolic (congestive) heart failure: Secondary | ICD-10-CM | POA: Diagnosis not present

## 2017-08-16 ENCOUNTER — Telehealth: Payer: Self-pay

## 2017-08-16 NOTE — Progress Notes (Signed)
EPIC Encounter for ICM Monitoring  Patient Name: Vanessa Carlson is a 56 y.o. female Date: 08/16/2017 Primary Care Physican: Virginia Crews, MD Primary Cardiologist: Carlis Abbott, NP HF Electrophysiologist: Faustino Congress Weight:Previous weight 213 lbs          Attempted call to patient and unable to reach.  Left detailed message regarding transmission.  Transmission reviewed.    Thoracic impedance normal.  Prescribed dosage: Furosemide 40 mg 1 tablet 3 times daily.   Labs: 07/08/2017 Creatinine 1.14, BUN 14, Potassium 4.2, Sodium 140, EGFR 53-62 03/23/2017 Creatinine 0.87, BUN 9, Potassium 3.7, Sodium 138, EGFR >60 02/23/2017 Creatinine 1.04, BUN 13, Potassium 3.8, Sodium 135, EGFR 58->60  02/08/2017 Creatinine 0.87, BUN 8, Potassium 3.2, Sodium 139, EGFR >60  01/28/2017 Creatinine 0.89, BUN 10, Potassium 3.6, Sodium 139, EGFR >60 12/31/2016 Creatinine 0.88, BUN 11, Potassium 3.2, Sodium 142, EGFR >60*  Recommendations: Left voice mail with ICM number and encouraged to call if experiencing any fluid symptoms.  Follow-up plan: ICM clinic phone appointment on 09/15/2017.    Copy of ICM check sent to Dr. Caryl Comes.   3 month ICM trend: 08/15/2017    1 Year ICM trend:       [name], RN 08/16/2017 2:29 PM

## 2017-08-16 NOTE — Telephone Encounter (Signed)
Remote ICM transmission received.  Attempted call to patient and left detailed message per DPR regarding transmission and next ICM scheduled for 09/15/2017.  Advised to return call for any fluid symptoms or questions.

## 2017-08-31 ENCOUNTER — Encounter: Payer: Self-pay | Admitting: Family Medicine

## 2017-08-31 ENCOUNTER — Ambulatory Visit (INDEPENDENT_AMBULATORY_CARE_PROVIDER_SITE_OTHER): Payer: Medicare Other | Admitting: Family Medicine

## 2017-08-31 ENCOUNTER — Encounter: Payer: Self-pay | Admitting: Emergency Medicine

## 2017-08-31 ENCOUNTER — Ambulatory Visit: Payer: Medicare Other | Admitting: Family Medicine

## 2017-08-31 ENCOUNTER — Emergency Department: Payer: Medicare Other

## 2017-08-31 ENCOUNTER — Other Ambulatory Visit: Payer: Self-pay | Admitting: Urgent Care

## 2017-08-31 ENCOUNTER — Emergency Department
Admission: EM | Admit: 2017-08-31 | Discharge: 2017-08-31 | Disposition: A | Discharge status: Home / Self Care | Payer: Medicare Other | Attending: Emergency Medicine | Admitting: Emergency Medicine

## 2017-08-31 ENCOUNTER — Other Ambulatory Visit: Payer: Self-pay

## 2017-08-31 ENCOUNTER — Ambulatory Visit: Payer: Medicare Other

## 2017-08-31 VITALS — BP 110/64 | HR 75 | Temp 98.2°F | Resp 16 | Wt 215.0 lb

## 2017-08-31 DIAGNOSIS — Z8541 Personal history of malignant neoplasm of cervix uteri: Secondary | ICD-10-CM | POA: Insufficient documentation

## 2017-08-31 DIAGNOSIS — Z95810 Presence of automatic (implantable) cardiac defibrillator: Secondary | ICD-10-CM | POA: Insufficient documentation

## 2017-08-31 DIAGNOSIS — I5022 Chronic systolic (congestive) heart failure: Secondary | ICD-10-CM | POA: Diagnosis not present

## 2017-08-31 DIAGNOSIS — I252 Old myocardial infarction: Secondary | ICD-10-CM | POA: Diagnosis not present

## 2017-08-31 DIAGNOSIS — Z952 Presence of prosthetic heart valve: Secondary | ICD-10-CM

## 2017-08-31 DIAGNOSIS — Z7901 Long term (current) use of anticoagulants: Secondary | ICD-10-CM

## 2017-08-31 DIAGNOSIS — R091 Pleurisy: Secondary | ICD-10-CM | POA: Diagnosis not present

## 2017-08-31 DIAGNOSIS — I2510 Atherosclerotic heart disease of native coronary artery without angina pectoris: Secondary | ICD-10-CM | POA: Insufficient documentation

## 2017-08-31 DIAGNOSIS — R791 Abnormal coagulation profile: Secondary | ICD-10-CM | POA: Diagnosis not present

## 2017-08-31 DIAGNOSIS — R0789 Other chest pain: Secondary | ICD-10-CM | POA: Diagnosis not present

## 2017-08-31 DIAGNOSIS — Z79899 Other long term (current) drug therapy: Secondary | ICD-10-CM | POA: Diagnosis not present

## 2017-08-31 DIAGNOSIS — R0602 Shortness of breath: Secondary | ICD-10-CM | POA: Diagnosis not present

## 2017-08-31 DIAGNOSIS — R079 Chest pain, unspecified: Secondary | ICD-10-CM | POA: Diagnosis present

## 2017-08-31 DIAGNOSIS — I110 Hypertensive heart disease with heart failure: Secondary | ICD-10-CM | POA: Diagnosis not present

## 2017-08-31 DIAGNOSIS — R918 Other nonspecific abnormal finding of lung field: Secondary | ICD-10-CM | POA: Diagnosis not present

## 2017-08-31 DIAGNOSIS — Z7982 Long term (current) use of aspirin: Secondary | ICD-10-CM | POA: Diagnosis not present

## 2017-08-31 DIAGNOSIS — I480 Paroxysmal atrial fibrillation: Secondary | ICD-10-CM

## 2017-08-31 LAB — PROTIME-INR
INR: 1.34
PROTHROMBIN TIME: 16.5 s — AB (ref 11.4–15.2)

## 2017-08-31 LAB — CBC
HEMATOCRIT: 39.2 % (ref 35.0–47.0)
HEMOGLOBIN: 13.3 g/dL (ref 12.0–16.0)
MCH: 28.7 pg (ref 26.0–34.0)
MCHC: 34 g/dL (ref 32.0–36.0)
MCV: 84.3 fL (ref 80.0–100.0)
Platelets: 151 10*3/uL (ref 150–440)
RBC: 4.65 MIL/uL (ref 3.80–5.20)
RDW: 15.7 % — ABNORMAL HIGH (ref 11.5–14.5)
WBC: 5.2 10*3/uL (ref 3.6–11.0)

## 2017-08-31 LAB — BASIC METABOLIC PANEL
ANION GAP: 8 (ref 5–15)
BUN: 14 mg/dL (ref 6–20)
CHLORIDE: 104 mmol/L (ref 101–111)
CO2: 27 mmol/L (ref 22–32)
Calcium: 8.9 mg/dL (ref 8.9–10.3)
Creatinine, Ser: 1.06 mg/dL — ABNORMAL HIGH (ref 0.44–1.00)
GFR calc Af Amer: >60 mL/min (ref >60)
GFR calc non Af Amer: 57 mL/min — ABNORMAL LOW (ref >60)
GLUCOSE: 113 mg/dL — AB (ref 65–99)
POTASSIUM: 3.9 mmol/L (ref 3.5–5.1)
Sodium: 139 mmol/L (ref 135–145)

## 2017-08-31 LAB — POCT INR
INR: 1.4
PT: 16.7

## 2017-08-31 LAB — APTT: aPTT: 38 seconds — ABNORMAL HIGH (ref 24–36)

## 2017-08-31 LAB — TROPONIN I: Troponin I: <0.03 ng/mL (ref <0.03)

## 2017-08-31 MED ORDER — OXYCODONE-ACETAMINOPHEN 5-325 MG PO TABS: 1.0000 | ORAL_TABLET | Freq: Four times a day (QID) | ORAL | 0 refills | Status: DC | PRN

## 2017-08-31 MED ORDER — IOPAMIDOL (ISOVUE-370) INJECTION 76%
75.0000 mL | Freq: Once | INTRAVENOUS | Status: AC | PRN
  Administered 2017-08-31: 75 mL via INTRAVENOUS
  Filled 2017-08-31: qty 75

## 2017-08-31 MED ORDER — OXYCODONE-ACETAMINOPHEN 5-325 MG PO TABS
1.0000 | ORAL_TABLET | Freq: Once | ORAL | Status: AC
Start: 2017-08-31 — End: 2017-08-31
  Administered 2017-08-31: 1 via ORAL
  Filled 2017-08-31: qty 1

## 2017-08-31 MED ORDER — ENOXAPARIN SODIUM 100 MG/ML ~~LOC~~ SOLN
1.0000 mg/kg | Freq: Once | SUBCUTANEOUS | Status: AC
  Administered 2017-08-31: 100 mg via SUBCUTANEOUS
  Filled 2017-08-31: qty 1

## 2017-08-31 NOTE — Assessment & Plan Note (Signed)
See plan above for coumadin dosing

## 2017-08-31 NOTE — Assessment & Plan Note (Signed)
Patient complaining of pleuritic chest pain VSS with normal O2 sat and comfortable WOB Suspect L pleural effusion on exam that is causing pain Given new EKG changes, however with h/o STEMI, needs ED eval Given INR is subtherapeutic, could also consider PE as cause of pain, though patient not hypoxic or tachycardic. Patient sent to ED for further management/eval May need to consider thoracentesis pending imaging

## 2017-08-31 NOTE — ED Provider Notes (Addendum)
[facility] Emergency Department Provider Note  ____________________________________________   I have reviewed the triage vital signs and the nursing notes. Where available I have reviewed prior notes and, if possible and indicated, outside hospital notes.    HISTORY  Chief Complaint Chest Pain    HPI Charlottie Peragine is a 57 y.o. female hx of cad, cervical cancer s/p hysterectomy 20 years ago in remission, valve replacement for rheumatic heart disease on coumadin, quit smoking 22 years ago, chf, co pain in the chest wall on the left side for 3 days. Gradual onset.  No recent falls, did fall 3 weeks ago.    Location: L ribs Radiation:  No radiation Quality:  sharp Duration:  3 days Timing:  All the time, worse when hunches over, better when sitting up straight.  Not exertional. Positional, worse when she lies on that side Severity: moderate Associated sxs:  + sob because it is uncomfortable. No leg swelling. No hx of blood clots.  PriorTreatment : tried tramadol at home and did not help much.    Past Medical History:  Diagnosis Date  . Anterior myocardial infarction [facility])    a. 10/2014 - occluded LAD, complicated by cardiogenic shock-->Med Rx as interventional team was unable to open LAD.  [name] Cervical cancer ([place])    in remission for ~20 yrs, s/p hysterectomy  . Chronic combined systolic and diastolic CHF (congestive heart failure) ([place])    a. 10/2015 Echo: EF 20-25%, sev diast dysfxn, mildly reduced RV fxn. Nl MV prosthesis.  . Coronary artery disease    a. 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  . Essential hypertension   . Ischemic cardiomyopathy    a. 03/2015 s/p MDT single lead AICD;  b. 10/2015 Echo: EF 20-25%.  . Mitral valve disease    a. 2006 s/p MVR w/ 34mm SJM bileaflet mechanical valve-->chronic coumadin/ASA.    Patient Active Problem List    Diagnosis Date Noted  . Pleurisy 08/31/2017  . Right shoulder pain 07/12/2017  . OA (osteoarthritis) of knee 07/08/2017  . Chronic anticoagulation 07/08/2017  . Paroxysmal atrial fibrillation (HCC)   . Coronary artery disease 02/24/2017  . Ischemic cardiomyopathy 02/24/2017  . Rheumatic heart disease 02/24/2017  . Status post mitral valve replacement 02/24/2017  . Hypokalemia 01/28/2017  . Chronic systolic heart failure ([place]) 01/02/2017  . HTN (hypertension) 01/02/2017    Past Surgical History:  Procedure Laterality Date  . APPENDECTOMY  1998  . CARDIAC CATHETERIZATION    . CARDIAC DEFIBRILLATOR PLACEMENT    . RADICAL HYSTERECTOMY  1998  . SHOULDER SURGERY Bilateral    rotator cuff tears  . VALVE REPLACEMENT     Mitral valve; 27 mm [employer] Jude bileaflet valve    Prior to Admission medications   Medication Sig Start Date End Date Taking? Authorizing Provider  aspirin 81 MG chewable tablet Chew 81 mg by mouth daily.    [provider]  atorvastatin (LIPITOR) 80 MG tablet Take 80 mg by mouth daily.    [provider]  fluticasone (FLONASE) 50 MCG/ACT nasal spray Place 2 sprays into both nostrils as needed for allergies or rhinitis.    [provider]  furosemide (LASIX) 40 MG tablet TAKE 1 TABLET BY MOUTH THREE TIMES DAILY 06/15/17   Darylene Price A, FNP  metoprolol succinate (TOPROL-XL) 50 MG 24 hr tablet Take 1/2 tablet (25 mg) by mouth two times a day. Take  with or immediately following a meal. 04/11/17   End, Harrell Gave, MD  nitroGLYCERIN (NITROSTAT) 0.3 MG SL tablet Place 1 tablet (0.3 mg total) under the tongue every 5 (five) minutes as needed for chest pain. 02/08/17   Alisa Graff, FNP  sacubitril-valsartan (ENTRESTO) 24-26 MG Take 1 tablet by mouth 2 (two) times daily. 02/08/17   Alisa Graff, FNP  spironolactone (ALDACTONE) 25 MG tablet Take 1 tablet (25 mg total) by mouth daily. 08/11/17   Bacigalupo, Dionne Bucy, MD  traMADol (ULTRAM) 50 MG  tablet Take 1 tablet (50 mg total) every 6 (six) hours as needed by mouth. 07/08/17   Virginia Crews, MD  warfarin (COUMADIN) 1 MG tablet Take 1 tablet (1 mg total) by mouth as directed. 08/10/17   Virginia Crews, MD  warfarin (COUMADIN) 2.5 MG tablet Take 8.5 mg PO daily on Mon, Wed, Fri, and 7.5 mg PO daily on Tues, Thurs, Sat, Sun 08/10/17   Virginia Crews, MD  warfarin (COUMADIN) 7.5 MG tablet Take 8.5 mg PO daily on Mon, Wed, Fri, and 7.5 mg PO daily on Tues, Thurs, Sat, Sun 08/11/17   Virginia Crews, MD    Allergies Patient has no known allergies.  Family History  Problem Relation Age of Onset  . Heart attack Mother   . Hypertension Mother   . Diabetes Mother   . Emphysema Father        smoker  . Diabetes Maternal Grandmother   . Kidney disease Maternal Grandmother   . Breast cancer Maternal Aunt   . Breast cancer Maternal Aunt   . Colon cancer Maternal Uncle     Social History Social History   Tobacco Use  . Smoking status: Former Smoker    Packs/day: 1.00    Years: 4.00    Pack years: 4.00    Types: Cigarettes    Last attempt to quit: 1998    Years since quitting: 21.0  . Smokeless tobacco: Never Used  Substance Use Topics  . Alcohol use: No  . Drug use: No    Review of Systems Constitutional: No fever/chills Eyes: No visual changes. ENT: No sore throat. No stiff neck no neck pain Cardiovascular: + chest pain. Respiratory: Denies shortness of breath. Gastrointestinal:   no vomiting.  No diarrhea.  No constipation. Genitourinary: Negative for dysuria. Musculoskeletal: Negative lower extremity swelling Skin: Negative for rash. Neurological: Negative for severe headaches, focal weakness or numbness.   ____________________________________________   PHYSICAL EXAM:  VITAL SIGNS: ED Triage Vitals  Enc Vitals Group     BP 08/31/17 1131 115/82     Pulse Rate 08/31/17 1131 72     Resp 08/31/17 1131 16     Temp 08/31/17 1131 98.1 F  (36.7 C)     Temp Source 08/31/17 1131 Oral     SpO2 08/31/17 1131 100 %     Weight 08/31/17 1131 215 lb (97.5 kg)     Height --      Head Circumference --      Peak Flow --      Pain Score 08/31/17 1130 8     Pain Loc --      Pain Edu? --      Excl. in [place]? --     Constitutional: Alert and oriented. Well appearing and in no acute distress. Eyes: Conjunctivae are normal Head: Atraumatic HEENT: No congestion/rhinnorhea. Mucous membranes are moist.  Oropharynx non-erythematous Neck:   Nontender with no meningismus, no masses, no  stridor Cardiovascular: Normal rate, regular rhythm.  Mechanical click appreciated.  Good peripheral circulation. Chest: Tender palpation left chest wall just under left breast, much necessary patient states "ouch that the pain right there" and post back.  No obvious lesions or erythema noted.  No crepitus no flail chest, there is no splenic pain or pain underneath her caudal to the rib cage, no abdominal discomfort and otherwise.  This is very reproducible chest wall pain Respiratory: Normal respiratory effort.  No retractions. Lungs CTAB. Abdominal: Soft and nontender. No distention. No guarding no rebound Back:  There is no focal tenderness or step off.  there is no midline tenderness there are no lesions noted. there is no CVA tenderness Musculoskeletal: No lower extremity tenderness, no upper extremity tenderness. No joint effusions, no DVT signs strong distal pulses no edema Neurologic:  Normal speech and language. No gross focal neurologic deficits are appreciated.  Skin:  Skin is warm, dry and intact. No rash noted. Psychiatric: Mood and affect are normal. Speech and behavior are normal.  ____________________________________________   LABS (all labs ordered are listed, but only abnormal results are displayed)  Labs Reviewed  BASIC METABOLIC PANEL - Abnormal; Notable for the following components:      Result Value   Glucose, Bld 113 (*)     Creatinine, Ser 1.06 (*)    GFR calc non Af Amer 57 (*)    All other components within normal limits  CBC - Abnormal; Notable for the following components:   RDW 15.7 (*)    All other components within normal limits  TROPONIN I    Pertinent labs  results that were available during my care of the patient were reviewed by me and considered in my medical decision making (see chart for details). ____________________________________________  EKG  I personally interpreted any EKGs ordered by me or triage Sinus rate 82 RAD noted, there is no acute ST elevation or depression, no acute ischemic changes ____________________________________________  RADIOLOGY  Pertinent labs & imaging results that were available during my care of the patient were reviewed by me and considered in my medical decision making (see chart for details). If possible, patient and/or family made aware of any abnormal findings.  Dg Chest 2 View  Result Date: 08/31/2017 CLINICAL DATA:  Shortness of breath and pain. EXAM: CHEST  2 VIEW COMPARISON:  None. FINDINGS: Two-view exam demonstrates no pulmonary edema or pleural effusion. 3 cm nodule is identified in the right lung with tethering to the lateral pleura. Patient is status post cardiac valve replacement with single lead pacer/ AICD visualized. The visualized bony structures of the thorax are intact. IMPRESSION: Right pulmonary nodule suspicious for neoplasm. CT chest with contrast recommended to further evaluate. Results were called by me at the time of interpretation on 08/31/2017 at 12:05 pm to Dr. Carrie Mew , who verbally acknowledged these results. Electronically Signed   By: Misty Stanley M.D.   On: 08/31/2017 12:05   ____________________________________________    PROCEDURES  Procedure(s) performed: None  Procedures  Critical Care performed: None  ____________________________________________   INITIAL IMPRESSION / ASSESSMENT AND PLAN / ED  COURSE  Pertinent labs & imaging results that were available during my care of the patient were reviewed by me and considered in my medical decision making (see chart for details).  Patient with very reproducible chest wall pain she is on Coumadin, we will check a Coumadin level, she has a mechanical valve, she states that she was subtherapeutic this morning but  she cannot remove the exact number.  Patient at risk for PE therefore we will obtain CT scan for PE given that chest x-ray per radiology already mandates imaging to rule out neoplastic event.  There is a right pulmonary nodule which certainly is not likely to be causing her very superficial left-sided chest wall pain.  We will give her pain medication here pending CT scan and we will continue to observe closely.  Very low suspicion for ACS given the patient's symptoms, findings, and negative troponin despite 3 steady nonstop days of pain, very reproducible etc. I do not think serial enzymes will add anything to the clinical picture.  ----------------------------------------- 4:15 PM on 08/31/2017 -----------------------------------------  Patient has a what appears to be primary lung cancer on the right which I do not think is causing her very focal pain in the ribs on the left.  Unclear why she has the pain in the left and really appears to be musculoskeletal we will send her home with pain control, I am paging oncology for further outpatient care.  INR is subtherapeutic this was apparently noted her primary care doctor and they have change the dosing on her Coumadin I will give her a dose of Lovenox to tide her over here she will follow-up with them tomorrow.   ----------------------------------------- 5:07 PM on 08/31/2017 -----------------------------------------  Pain-free after Percocet unless I touch her, she will take the Lovenox or must follow closely with her [facility] care doctor tomorrow morning for ongoing maintenance of her  Coumadin, this is likely secondary to dietary indiscretion or noncompliance although she feels that it is not.  In any event, she is Artie on high doses of Coumadin and I will allow the primary care doctor to continue managing that tomorrow.  I discussed with Dr. Mike Gip, of oncology they will see the patient tomorrow for her likely oncologic process and I have made the patient aware of the significance of this and the absolute need for follow-up.   ____________________________________________   FINAL CLINICAL IMPRESSION(S) / ED DIAGNOSES  Final diagnoses:  None      This chart was dictated using voice recognition software.  Despite best efforts to proofread,  errors can occur which can change meaning.      Schuyler Amor, MD 08/31/17 1517    Schuyler Amor, MD 08/31/17 1615    Schuyler Amor, MD 08/31/17 1648    Schuyler Amor, MD 08/31/17 [phone]

## 2017-08-31 NOTE — Discharge Instructions (Signed)
Please call your doctor first thing in the morning to have further care with your INR as you are subtherapeutic.  We have given you Lovenox to last till tomorrow morning we do ask that you follow closely with your doctor for further management.  We do notice an unusual finding on your CT scan which is a small mass at about 2.5 cm or less than an inch however, anytime we see anything it is of great concern.  We cannot tell you exactly what it is but there is always a chance that it is something serious like cancer and for this reason we ask you to follow closely with cancer doctor as noted above to make sure that you are okay or treat you if there is something serious going on.  Finally, you have pain in her chest wall, we do not think this likely represents a heart attack or blood clot or anything serious however, if you have worsening pain shortness of breath or you feel worse in any way please return to the emergency department.  Do not take Percocet tramadol or other sedating medications.  Do not drive on  Percocet.

## 2017-08-31 NOTE — Assessment & Plan Note (Signed)
INR subtherapeutic at 1.4 today Patient denies missed doses, but suspect noncompliance is an issue as her INR is up and down Will adjust dose to 8.5mg  daily (~10% dose increase) Recheck INR in 2 weeks

## 2017-08-31 NOTE — Progress Notes (Signed)
Patient: Vanessa Carlson Female    DOB: 23-Nov-1960   57 y.o.   MRN: [id number] Visit Date: 08/31/2017  Today's Provider: Lavon Paganini, MD   I, Martha Clan, CMA, am acting as scribe for Lavon Paganini, MD.  Chief Complaint  Patient presents with  . Abdominal Pain   Subjective:    Abdominal Pain  This is a new problem. Episode onset: "couple days" The onset quality is gradual. The problem occurs constantly. The problem has been unchanged. The pain is located in the LUQ (rib pain per pt). The pain is at a severity of 8/10. The pain is severe. The quality of the pain is sharp, cramping and aching (sore to the touch). The abdominal pain does not radiate. Associated symptoms include headaches and nausea. Pertinent negatives include no anorexia, belching, constipation, diarrhea, fever, hematochezia, hematuria, melena or vomiting. The pain is aggravated by deep breathing and movement. The pain is relieved by being still. Treatments tried: Tramadol. The treatment provided no relief.    Getting worse. no inciting injury/trauma This seems as painful as heart attack and similar in quality to pericarditis that she had previously No recent URI. No cough, wheezing, vomiting, diarrhea + nausea  Constant pain Worse with deep breathing, movement + SOB intermittently    Pt also still c/o right shoulder pain. Last seen 07/13/2017 for this problem. Imaging ordered, and showed moderate arthritis of AC joint, ,and possible bone spur on head of humerus. She would like to discuss steroid injection.   No Known Allergies   Current Outpatient Medications:  .  aspirin 81 MG chewable tablet, Chew 81 mg by mouth daily., Disp: , Rfl:  .  atorvastatin (LIPITOR) 80 MG tablet, Take 80 mg by mouth daily., Disp: , Rfl:  .  fluticasone (FLONASE) 50 MCG/ACT nasal spray, Place 2 sprays into both nostrils as needed for allergies or rhinitis., Disp: , Rfl:  .  furosemide (LASIX) 40 MG tablet, TAKE 1  TABLET BY MOUTH THREE TIMES DAILY, Disp: 270 tablet, Rfl: 3 .  metoprolol succinate (TOPROL-XL) 50 MG 24 hr tablet, Take 1/2 tablet (25 mg) by mouth two times a day. Take with or immediately following a meal., Disp: 90 tablet, Rfl: 3 .  nitroGLYCERIN (NITROSTAT) 0.3 MG SL tablet, Place 1 tablet (0.3 mg total) under the tongue every 5 (five) minutes as needed for chest pain., Disp: 30 tablet, Rfl: 3 .  sacubitril-valsartan (ENTRESTO) 24-26 MG, Take 1 tablet by mouth 2 (two) times daily., Disp: 60 tablet, Rfl: 5 .  spironolactone (ALDACTONE) 25 MG tablet, Take 1 tablet (25 mg total) by mouth daily., Disp: 90 tablet, Rfl: 3 .  traMADol (ULTRAM) 50 MG tablet, Take 1 tablet (50 mg total) every 6 (six) hours as needed by mouth., Disp: 30 tablet, Rfl: 5 .  warfarin (COUMADIN) 1 MG tablet, Take 1 tablet (1 mg total) by mouth as directed., Disp: 30 tablet, Rfl: 2 .  warfarin (COUMADIN) 2.5 MG tablet, Take 8.5 mg PO daily on Mon, Wed, Fri, and 7.5 mg PO daily on Tues, Thurs, Sat, Sun, Disp: 90 tablet, Rfl: 1 .  warfarin (COUMADIN) 7.5 MG tablet, Take 8.5 mg PO daily on Mon, Wed, Fri, and 7.5 mg PO daily on Tues, Thurs, Sat, Sun, Disp: 90 tablet, Rfl: 1  Review of Systems  Constitutional: Negative for fever.  Gastrointestinal: Positive for abdominal pain and nausea. Negative for anorexia, constipation, diarrhea, hematochezia, melena and vomiting.  Genitourinary: Negative for hematuria.  Neurological: Positive  for headaches.    Social History   Tobacco Use  . Smoking status: Former Smoker    Packs/day: 1.00    Years: 4.00    Pack years: 4.00    Types: Cigarettes    Last attempt to quit: 1998    Years since quitting: 21.0  . Smokeless tobacco: Never Used  Substance Use Topics  . Alcohol use: No   Objective:   BP 110/64 (BP Location: Left Arm, Patient Position: Sitting, Cuff Size: Large)   Pulse 75   Temp 98.2 F (36.8 C) (Oral)   Resp 16   Wt 215 lb (97.5 kg)   LMP  (LMP Unknown)   SpO2 98%    BMI 38.70 kg/m  Vitals:   08/31/17 0943  BP: 110/64  Pulse: 75  Resp: 16  Temp: 98.2 F (36.8 C)  TempSrc: Oral  SpO2: 98%  Weight: 215 lb (97.5 kg)     Physical Exam  Constitutional: She is oriented to person, place, and time. She appears well-developed and well-nourished. No distress.  HENT:  Head: Normocephalic and atraumatic.  Nose: Nose normal.  Mouth/Throat: Oropharynx is clear and moist. No oropharyngeal exudate.  Eyes: Conjunctivae are normal. No scleral icterus.  Neck: Neck supple.  Cardiovascular: Normal rate, regular rhythm and intact distal pulses.  Murmur heard. Pulmonary/Chest: Effort normal. No respiratory distress. She has no wheezes. She exhibits tenderness (mild TTP over L lower costal margins).  Crackles in L base Appears to be in pain with deep breathing  Abdominal: Soft. Bowel sounds are normal. She exhibits no distension. There is no tenderness. There is no rebound and no guarding.  Musculoskeletal: She exhibits no edema or deformity.  Neurological: She is alert and oriented to person, place, and time.  Skin: Skin is warm and dry. No rash noted.  Psychiatric: She has a normal mood and affect. Her behavior is normal.  Vitals reviewed.   Results for orders placed or performed in visit on 08/31/17  POCT INR  Result Value Ref Range   INR 1.4    PT 16.7     EKG: NSR, stbale lateral Q waves, new ST elevations in V1 and V2 compared to EKG from 08/03/17    Assessment & Plan:      Problem List Items Addressed This Visit      Cardiovascular and Mediastinum   Chronic systolic heart failure (HCC) (Chronic)    Significantly reduced EF on recent Echo Suspect patient has L pleural effusion that could be related to CHF AICD in place      Paroxysmal atrial fibrillation ([place])   Relevant Orders   POCT INR     Respiratory   Pleurisy - Primary    Patient complaining of pleuritic chest pain VSS with normal O2 sat and comfortable WOB Suspect L pleural  effusion on exam that is causing pain Given new EKG changes, however with h/o STEMI, needs ED eval Given INR is subtherapeutic, could also consider PE as cause of pain, though patient not hypoxic or tachycardic. Patient sent to ED for further management/eval May need to consider thoracentesis pending imaging        Other   Status post mitral valve replacement    INR subtherapeutic at 1.4 today Patient denies missed doses, but suspect noncompliance is an issue as her INR is up and down Will adjust dose to 8.5mg  daily (~10% dose increase) Recheck INR in 2 weeks      Chronic anticoagulation    See plan above  for coumadin dosing         Return in about 2 weeks (around 09/14/2017) for INR, shoulder pain. Discussed that due to complexity of chest pain and other issues, discussed with patient that I will see her for her chronic shoulder pain and consider shoulder injection when she is well and not having more pressing medical problems      The entirety of the information documented in the History of Present Illness, Review of Systems and Physical Exam were personally obtained by me. Portions of this information were initially documented by Raquel Sarna Ratchford, CMA and reviewed by me for thoroughness and accuracy.    Virginia Crews, MD, MPH [facility] 08/31/2017 11:10 AM

## 2017-08-31 NOTE — ED Notes (Signed)
CT informed that pt now has a 20g above her wrist

## 2017-08-31 NOTE — ED Notes (Signed)
Topaz not working

## 2017-08-31 NOTE — Patient Instructions (Signed)
Change Coumadin to 8.5mg  daily.  Recheck INR in 2 weeks   Pleurisy Pleurisy, also called pleuritis, is irritation and swelling (inflammation) of the linings of the lungs. The linings of the lungs are called pleura. They cover the outside of the lungs and the inside of the chest wall. There is a small amount of fluid (pleural fluid) between the pleura that allows the lungs to move in and out smoothly when you breathe. Pleurisy causes the pleura to be rough and dry and to rub together when you breathe, which is painful. In some cases, pleurisy can cause pleural fluid to build up between the pleura (pleural effusion). What are the causes? Common causes of this condition include:  A lung infection caused by bacteria or a virus.  A blood clot that travels to the lung (pulmonary embolism).  Air leaking into the pleural space (pneumothorax).  Lung cancer or a lung tumor.  A chest injury.  Diseases that can cause lung inflammation. These include rheumatoid arthritis, lupus, sickle cell disease, inflammatory bowel disease, and pancreatitis.  Heart or chest surgery.  Lung damage from inhaling asbestos.  A lung reaction to certain medicines.  Sometimes the cause is unknown. What are the signs or symptoms? Chest pain is the main symptom of this condition. The pain is usually on one side. Chest pain may start suddenly and be sharp or stabbing. It may become a constant dull ache. You may also feel pain in your back or shoulder. The pain may get worse when you cough, take deep breaths, or make sudden movements. Other symptoms may include:  Shortness of breath.  Noisy breathing (wheezing).  Cough.  Chills.  Fever.  How is this diagnosed? This condition may be diagnosed based on:  Your medical history.  Your symptoms.  A physical exam. Your health care provider will listen to your breathing with a stethoscope to check for a rough, rubbing sound (friction rub). If you have pleural  effusion, your breathing sounds may be muffled.  Tests, such as: ? Blood tests to check for infections or diseases and to measure the oxygen in your blood. ? Imaging studies of your lungs. These may include a chest X-ray, ultrasound, MRI, or CT scan. ? A procedure to remove pleural fluid with a needle for testing (thoracentesis).  How is this treated? Treatment for this condition depends on the cause. Pleurisy that was caused by a virus usually clears up within 2 weeks. Treatment for pleurisy may include:  NSAIDs to help relieve pain and swelling.  Antibiotic medicines, if your condition was caused by a bacterial infection.  Prescription pain or cough medicine.  Medicines to dissolve a blood clot, if your condition was caused by pulmonary embolism.  Removal of pleural fluid or air.  Follow these instructions at home: Medicines  Take over-the-counter and prescription medicines only as told by your health care provider.  If you were prescribed an antibiotic, take it as told by your health care provider. Do not stop taking the antibiotic even if you start to feel better. Activity  Rest and return to your normal activities as told by your health care provider. Ask your health care provider what activities are safe for you.  Do not drive or use heavy machinery while taking prescription pain medicine. General instructions  Monitor your pleurisy for any changes.  Take deep breaths often, even if it is painful. This can help prevent lung infection (pneumonia) and collapse of lung tissue (atelectasis).  When lying down, lie  on your painful side. This may reduce pain.  Do not smoke. If you need help quitting, ask your health care provider.  Keep all follow-up visits as told by your health care provider. This is important. Contact a health care provider if:  You have pain that: ? Gets worse. ? Does not get better with medicine. ? Lasts for more than 1 week.  You have a fever or  chills.  Your cough or shortness of breath is not improving at home.  You cough up pus-like (purulent) secretions. Get help right away if:  Your lips, fingernails, or toenails darken or turn blue.  You cough up blood.  You have any of the following symptoms that get worse: ? Difficulty breathing. ? Shortness of breath. ? Wheezing.  You have pain that spreads into your neck, arms, or jaw.  You develop a rash.  You vomit.  You faint. Summary  Pleurisy is inflammation of the linings of the lungs (pleura).  Pleurisy causes pain that makes it difficult for you to breathe or cough.  Pleurisy is often caused by an underlying infection or disease.  Treatment of pleurisy depends on the cause, and it often includes medicines. This information is not intended to replace advice given to you by your health care provider. Make sure you discuss any questions you have with your health care provider. Document Released: 08/16/2005 Document Revised: 05/10/2016 Document Reviewed: 05/10/2016 Elsevier Interactive Patient Education  2017 [employer].

## 2017-08-31 NOTE — Assessment & Plan Note (Signed)
Significantly reduced EF on recent Echo Suspect patient has L pleural effusion that could be related to CHF AICD in place

## 2017-08-31 NOTE — ED Triage Notes (Signed)
Pt to ed with c/o left rib pain and chest pain, sob, nausea x 2 days. Was seen today at [facility] and sent here from there. Denies injury.

## 2017-09-01 ENCOUNTER — Encounter: Payer: Self-pay | Admitting: *Deleted

## 2017-09-01 ENCOUNTER — Ambulatory Visit: Payer: Medicare Other | Admitting: Family Medicine

## 2017-09-01 NOTE — Progress Notes (Signed)
  Oncology Nurse Navigator Documentation  Navigator Location: [facility] (09/01/17 1000) Referral date to RadOnc/MedOnc: 08/31/17 (09/01/17 1000) )Navigator Encounter Type: Introductory phone call (09/01/17 1000)   Abnormal Finding Date: 08/31/17 (09/01/17 1000)                   Treatment Phase: Abnormal Scans (09/01/17 1000) Barriers/Navigation Needs: Coordination of Care (09/01/17 1000)   Interventions: Coordination of Care (09/01/17 1000)   Coordination of Care: Appts;Radiology (09/01/17 1000)        Acuity: Level 2 (09/01/17 1000)   Acuity Level 2: Initial guidance, education and coordination as needed;Educational needs;Assistance expediting appointments (09/01/17 1000)    referral received from ED for RUL lung mass. Spoke with Dr. Mike Gip who recommended obtain PET as outpatient and schedule in [facility] on Friday 1/11. Spoke with pt to review plan of care. Pt in agreement. Reviewed upcoming appts with patient for PET scan and initial consultation on Friday 1/11 with Dr. Mike Gip. Pt verbalized understanding and confirmed appts.  Time Spent with Patient: 30 (09/01/17 1000)

## 2017-09-02 ENCOUNTER — Encounter: Payer: Self-pay | Admitting: Family Medicine

## 2017-09-05 ENCOUNTER — Encounter: Payer: Self-pay | Admitting: *Deleted

## 2017-09-05 DIAGNOSIS — R918 Other nonspecific abnormal finding of lung field: Secondary | ICD-10-CM

## 2017-09-05 NOTE — Progress Notes (Signed)
  Oncology Nurse Navigator Documentation  Navigator Location: [facility] (09/05/17 1100)   )Navigator Encounter Type: Other (09/05/17 1100)                         Barriers/Navigation Needs: Coordination of Care (09/05/17 1100)   Interventions: Referrals;Coordination of Care (09/05/17 1100) Referrals: Other(thoracic surgery) (09/05/17 1100) Coordination of Care: Appts (09/05/17 1100)         referral to Dr. Genevive Bi for surgical evaluation ordered. Pt scheduled for PET on Wed and will discuss at tumor case conference on Thursday. Pt will be evaluated by Dr. Mike Gip and Dr. Genevive Bi in the thoracic [facility] Friday morning. Pt has been made aware of all appts scheduled. Nothing further needed at this time.         Time Spent with Patient: 30 (09/05/17 1100)

## 2017-09-05 NOTE — Progress Notes (Signed)
Patient ID: Vanessa Carlson, female    DOB: 1961-01-09, 57 y.o.   MRN: [id number]  HPI  [name] is a 57 y/o female with a history of cervical cancer, atrial fibrillation, CAD, HTN, MI, AICD and chronic heart failure.   Reviewed last echo report done on 11/20/15 which showed an EF of 20-25%.  Was in the ED 08/31/17 due to chest pain. Right pulmonary nodule found with follow-up with oncology. She was released from the ED after symptomatic treatment. PE ruled out.   She presents today for her follow-up visit with a chief complaint of minimal fatigue upon moderate exertion. She says this has been present for several months with varying levels of severity. She has associated rare abdominal pain & right shoulder pain. She denies chest pain, cough, shortness of breath, edema, palpitations, dizziness, weight gain or difficulty sleeping. Has recently been told she has a lung nodule and is in the process of working this up.   Past Medical History:  Diagnosis Date  . Anterior myocardial infarction [facility])    a. 10/2014 - occluded LAD, complicated by cardiogenic shock-->Med Rx as interventional team was unable to open LAD.  [name] Cervical cancer ([place])    in remission for ~20 yrs, s/p hysterectomy  . Chronic combined systolic and diastolic CHF (congestive heart failure) ([place])    a. 10/2015 Echo: EF 20-25%, sev diast dysfxn, mildly reduced RV fxn. Nl MV prosthesis.  . Coronary artery disease    a. 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  . Essential hypertension   . Ischemic cardiomyopathy    a. 03/2015 s/p MDT single lead AICD;  b. 10/2015 Echo: EF 20-25%.  . Mitral valve disease    a. 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   Past Surgical History:  Procedure Laterality Date  . APPENDECTOMY  1998  . CARDIAC CATHETERIZATION    . CARDIAC DEFIBRILLATOR PLACEMENT    . RADICAL  HYSTERECTOMY  1998  . SHOULDER SURGERY Bilateral    rotator cuff tears  . VALVE REPLACEMENT     Mitral valve; 27 mm [employer] Jude bileaflet valve   Family History  Problem Relation Age of Onset  . Heart attack Mother   . Hypertension Mother   . Diabetes Mother   . Emphysema Father        smoker  . Diabetes Maternal Grandmother   . Kidney disease Maternal Grandmother   . Breast cancer Maternal Aunt   . Breast cancer Maternal Aunt   . Colon cancer Maternal Uncle    Social History   Tobacco Use  . Smoking status: Former Smoker    Packs/day: 1.00    Years: 4.00    Pack years: 4.00    Types: Cigarettes    Last attempt to quit: 1998    Years since quitting: 21.0  . Smokeless tobacco: Never Used  Substance Use Topics  . Alcohol use: No   No Known Allergies  Prior to Admission medications   Medication Sig Start Date End Date Taking? Authorizing Provider  aspirin 81 MG chewable tablet Chew 81 mg by mouth daily.   Yes [provider]  atorvastatin (LIPITOR) 80 MG tablet Take 80 mg by mouth daily.   Yes [provider]  fluticasone (FLONASE) 50 MCG/ACT nasal spray Place 2 sprays into both nostrils as needed for allergies or rhinitis.   Yes [provider]  furosemide (LASIX)  40 MG tablet TAKE 1 TABLET BY MOUTH THREE TIMES DAILY 06/15/17  Yes Darylene Price A, FNP  metoprolol succinate (TOPROL-XL) 50 MG 24 hr tablet Take 1/2 tablet (25 mg) by mouth two times a day. Take with or immediately following a meal. 04/11/17  Yes End, Harrell Gave, MD  nitroGLYCERIN (NITROSTAT) 0.3 MG SL tablet Place 1 tablet (0.3 mg total) under the tongue every 5 (five) minutes as needed for chest pain. 02/08/17  Yes Tadhg Eskew, Otila Kluver A, FNP  oxyCODONE-acetaminophen (ROXICET) 5-325 MG tablet Take 1 tablet by mouth every 6 (six) hours as needed. 08/31/17  Yes McShane, Gerda Diss, MD  sacubitril-valsartan (ENTRESTO) 24-26 MG Take 1 tablet by mouth 2 (two) times daily. 02/08/17  Yes Darylene Price A, FNP   spironolactone (ALDACTONE) 25 MG tablet Take 1 tablet (25 mg total) by mouth daily. 08/11/17  Yes Bacigalupo, Dionne Bucy, MD  traMADol (ULTRAM) 50 MG tablet Take 1 tablet (50 mg total) every 6 (six) hours as needed by mouth. 07/08/17  Yes Bacigalupo, Dionne Bucy, MD  warfarin (COUMADIN) 1 MG tablet Take 1 tablet (1 mg total) by mouth as directed. 08/10/17  Yes Virginia Crews, MD  warfarin (COUMADIN) 2.5 MG tablet Take 8.5 mg PO daily on Mon, Wed, Fri, and 7.5 mg PO daily on Tues, Thurs, Sat, Sun 08/10/17  Yes Virginia Crews, MD  warfarin (COUMADIN) 7.5 MG tablet Take 8.5 mg PO daily on Mon, Wed, Fri, and 7.5 mg PO daily on Tues, Thurs, Sat, Sun 08/11/17  Yes Bacigalupo, Dionne Bucy, MD    Review of Systems  Constitutional: Positive for fatigue. Negative for appetite change.  HENT: Negative for congestion, postnasal drip and sore throat.   Eyes: Negative.   Respiratory: Negative for cough, chest tightness and shortness of breath.   Cardiovascular: Negative for chest pain, palpitations and leg swelling.  Gastrointestinal: Positive for abdominal pain (rarely). Negative for abdominal distention.  Endocrine: Negative.   Genitourinary: Negative.   Musculoskeletal: Positive for arthralgias (right shoulder (bone spur/arthritis)). Negative for back pain and neck pain.  Skin: Negative.   Allergic/Immunologic: Negative.   Neurological: Negative for dizziness and light-headedness.  Hematological: Negative for adenopathy. Does not bruise/bleed easily.  Psychiatric/Behavioral: Negative for dysphoric mood and sleep disturbance (sleeping on 2 pillows). The patient is not nervous/anxious.    Vitals:   09/06/17 0918  BP: 98/61  Pulse: 72  Resp: 18  SpO2: 98%  Weight: 213 lb 2 oz (96.7 kg)  Height: 5\' 2"  (1.575 m)   Wt Readings from Last 3 Encounters:  09/06/17 213 lb 2 oz (96.7 kg)  08/31/17 215 lb (97.5 kg)  08/31/17 215 lb (97.5 kg)   Lab Results  Component Value Date   CREATININE 1.06  (H) 08/31/2017   CREATININE 1.14 (H) 07/08/2017   CREATININE 0.87 03/23/2017    Physical Exam  Constitutional: She is oriented to person, place, and time. She appears well-developed and well-nourished.  HENT:  Head: Normocephalic and atraumatic.  Neck: Normal range of motion. Neck supple. No JVD present.  Cardiovascular: Normal rate and regular rhythm.  Pulmonary/Chest: Effort normal. She has no wheezes. She has no rales.  Abdominal: Soft. She exhibits no distension.  Musculoskeletal: She exhibits no edema or tenderness.  Neurological: She is alert and oriented to person, place, and time.  Skin: Skin is warm and dry.  Psychiatric: She has a normal mood and affect. Her behavior is normal. Thought content normal.  Nursing note and vitals reviewed.  Assessment & Plan:  1: Chronic heart failure with reduced ejection fraction- - NYHA class II - euvolemic today - not adding salt to food. Reminded to follow a 2000 mg sodium diet  - weighing daily. Instructed to call for an overnight weight gain of >2 pounds or a weekly weight gain of >5  - weight stable since she was last here - taking furosemide 40mg  AM/ 80mg  PM - discussed needing an updated echo but she's currently being worked up for a lung nodule so will wait for now - taking spironolactone 25 mg daily  - saw cardiology (End) 08/03/17 & returns 11/09/17 - patient reports receiving the flu vaccine for this season already  2: HTN- - BP on the low side today but consistent for her - unable to titrate medications due to hypotension - saw PCP Brita Romp) 07/12/17 & returns 01/05/18 - BMP from 08/31/17 reviewed and showed sodium 139, potassium 3.9 and GFR >60  3: Atrial fibrillation- - heart rate controlled at this time - AICD present but denies shocks - continue warfarin and metoprolol succinate  - follows with EP Caryl Comes) and was last seen by him 12/07/16  4: Lung nodule- - due for a PET scan tomorrow with oncology follow-up  afterwards  Patient did not bring her medications nor a list. Each medication was verbally reviewed with the patient and she was encouraged to bring the bottles to every visit to confirm accuracy of list.    Return in 6 months or sooner for any questions/problems before then.

## 2017-09-06 ENCOUNTER — Encounter: Payer: Self-pay | Admitting: Family

## 2017-09-06 ENCOUNTER — Ambulatory Visit: Payer: Medicare Other | Admitting: Family

## 2017-09-06 VITALS — BP 98/61 | HR 72 | Resp 18 | Ht 62.0 in | Wt 213.1 lb

## 2017-09-06 DIAGNOSIS — I10 Essential (primary) hypertension: Secondary | ICD-10-CM

## 2017-09-06 DIAGNOSIS — I5022 Chronic systolic (congestive) heart failure: Secondary | ICD-10-CM

## 2017-09-06 DIAGNOSIS — R918 Other nonspecific abnormal finding of lung field: Secondary | ICD-10-CM | POA: Diagnosis not present

## 2017-09-06 DIAGNOSIS — I480 Paroxysmal atrial fibrillation: Secondary | ICD-10-CM

## 2017-09-06 DIAGNOSIS — R911 Solitary pulmonary nodule: Secondary | ICD-10-CM

## 2017-09-06 NOTE — Patient Instructions (Signed)
Continue weighing daily and call for an overnight weight gain of > 2 pounds or a weekly weight gain of >5 pounds. 

## 2017-09-07 ENCOUNTER — Telehealth: Payer: Self-pay | Admitting: *Deleted

## 2017-09-07 ENCOUNTER — Ambulatory Visit
Admission: RE | Admit: 2017-09-07 | Discharge: 2017-09-07 | Disposition: A | Discharge status: Home / Self Care | Payer: Medicare Other | Source: Ambulatory Visit | Attending: Urgent Care | Admitting: Urgent Care

## 2017-09-07 DIAGNOSIS — R911 Solitary pulmonary nodule: Secondary | ICD-10-CM | POA: Diagnosis not present

## 2017-09-07 DIAGNOSIS — R918 Other nonspecific abnormal finding of lung field: Secondary | ICD-10-CM | POA: Diagnosis not present

## 2017-09-07 LAB — GLUCOSE, CAPILLARY: GLUCOSE-CAPILLARY: 118 mg/dL — AB (ref 65–99)

## 2017-09-07 MED ORDER — FLUDEOXYGLUCOSE F - 18 (FDG) INJECTION
12.9200 | Freq: Once | INTRAVENOUS | Status: AC | PRN
  Administered 2017-09-07: 12.92 via INTRAVENOUS

## 2017-09-07 NOTE — Telephone Encounter (Signed)
Reviewed PET scan results with patient. Instructed pt regarding upcoming appts. Contact info given and informed to call with any further questions or needs. Pt verbalized understanding and confirmed all appts.

## 2017-09-08 ENCOUNTER — Ambulatory Visit: Payer: Medicare Other | Attending: Urgent Care

## 2017-09-08 ENCOUNTER — Ambulatory Visit (INDEPENDENT_AMBULATORY_CARE_PROVIDER_SITE_OTHER): Payer: Medicare Other | Admitting: Family Medicine

## 2017-09-08 ENCOUNTER — Encounter: Payer: Self-pay | Admitting: Family Medicine

## 2017-09-08 VITALS — BP 112/72 | HR 84 | Temp 98.3°F | Resp 16 | Wt 217.0 lb

## 2017-09-08 DIAGNOSIS — M25511 Pain in right shoulder: Secondary | ICD-10-CM | POA: Diagnosis not present

## 2017-09-08 DIAGNOSIS — R911 Solitary pulmonary nodule: Secondary | ICD-10-CM

## 2017-09-08 DIAGNOSIS — R0789 Other chest pain: Secondary | ICD-10-CM | POA: Diagnosis not present

## 2017-09-08 DIAGNOSIS — R918 Other nonspecific abnormal finding of lung field: Secondary | ICD-10-CM | POA: Diagnosis not present

## 2017-09-08 DIAGNOSIS — I480 Paroxysmal atrial fibrillation: Secondary | ICD-10-CM

## 2017-09-08 DIAGNOSIS — Z952 Presence of prosthetic heart valve: Secondary | ICD-10-CM

## 2017-09-08 DIAGNOSIS — G8929 Other chronic pain: Secondary | ICD-10-CM

## 2017-09-08 LAB — POCT INR: INR: 1.7

## 2017-09-08 MED ORDER — ENOXAPARIN SODIUM 100 MG/ML ~~LOC~~ SOLN: 100.0000 mg | Freq: Two times a day (BID) | SUBCUTANEOUS | 0 refills | Status: DC

## 2017-09-08 MED ORDER — ALBUTEROL SULFATE (2.5 MG/3ML) 0.083% IN NEBU
2.5000 mg | INHALATION_SOLUTION | Freq: Once | RESPIRATORY_TRACT | Status: AC
  Administered 2017-09-08: 2.5 mg via RESPIRATORY_TRACT
  Filled 2017-09-08: qty 3

## 2017-09-08 NOTE — Patient Instructions (Signed)
Shoulder Exercises Ask your health care provider which exercises are safe for you. Do exercises exactly as told by your health care provider and adjust them as directed. It is normal to feel mild stretching, pulling, tightness, or discomfort as you do these exercises, but you should stop right away if you feel sudden pain or your pain gets worse.Do not begin these exercises until told by your health care provider. RANGE OF MOTION EXERCISES These exercises warm up your muscles and joints and improve the movement and flexibility of your shoulder. These exercises also help to relieve pain, numbness, and tingling. These exercises involve stretching your injured shoulder directly. Exercise A: Pendulum  1. Stand near a wall or a surface that you can hold onto for balance. 2. Bend at the waist and let your left / right arm hang straight down. Use your other arm to support you. Keep your back straight and do not lock your knees. 3. Relax your left / right arm and shoulder muscles, and move your hips and your trunk so your left / right arm swings freely. Your arm should swing because of the motion of your body, not because you are using your arm or shoulder muscles. 4. Keep moving your body so your arm swings in the following directions, as told by your health care provider: ? Side to side. ? Forward and backward. ? In clockwise and counterclockwise circles. 5. Continue each motion for __________ seconds, or for as long as told by your health care provider. 6. Slowly return to the starting position. Repeat __________ times. Complete this exercise __________ times a day. Exercise B:Flexion, Standing  1. Stand and hold a broomstick, a cane, or a similar object. Place your hands a little more than shoulder-width apart on the object. Your left / right hand should be palm-up, and your other hand should be palm-down. 2. Keep your elbow straight and keep your shoulder muscles relaxed. Push the stick down with  your healthy arm to raise your left / right arm in front of your body, and then over your head until you feel a stretch in your shoulder. ? Avoid shrugging your shoulder while you raise your arm. Keep your shoulder blade tucked down toward the middle of your back. 3. Hold for __________ seconds. 4. Slowly return to the starting position. Repeat __________ times. Complete this exercise __________ times a day. Exercise C: Abduction, Standing 1. Stand and hold a broomstick, a cane, or a similar object. Place your hands a little more than shoulder-width apart on the object. Your left / right hand should be palm-up, and your other hand should be palm-down. 2. While keeping your elbow straight and your shoulder muscles relaxed, push the stick across your body toward your left / right side. Raise your left / right arm to the side of your body and then over your head until you feel a stretch in your shoulder. ? Do not raise your arm above shoulder height, unless your health care provider tells you to do that. ? Avoid shrugging your shoulder while you raise your arm. Keep your shoulder blade tucked down toward the middle of your back. 3. Hold for __________ seconds. 4. Slowly return to the starting position. Repeat __________ times. Complete this exercise __________ times a day. Exercise D:Internal Rotation  1. Place your left / right hand behind your back, palm-up. 2. Use your other hand to dangle an exercise band, a towel, or a similar object over your shoulder. Grasp the band with   your left / right hand so you are holding onto both ends. 3. Gently pull up on the band until you feel a stretch in the front of your left / right shoulder. ? Avoid shrugging your shoulder while you raise your arm. Keep your shoulder blade tucked down toward the middle of your back. 4. Hold for __________ seconds. 5. Release the stretch by letting go of the band and lowering your hands. Repeat __________ times. Complete  this exercise __________ times a day. STRETCHING EXERCISES These exercises warm up your muscles and joints and improve the movement and flexibility of your shoulder. These exercises also help to relieve pain, numbness, and tingling. These exercises are done using your healthy shoulder to help stretch the muscles of your injured shoulder. Exercise E: Corner Stretch (External Rotation and Abduction)  1. Stand in a doorway with one of your feet slightly in front of the other. This is called a staggered stance. If you cannot reach your forearms to the door frame, stand facing a corner of a room. 2. Choose one of the following positions as told by your health care provider: ? Place your hands and forearms on the door frame above your head. ? Place your hands and forearms on the door frame at the height of your head. ? Place your hands on the door frame at the height of your elbows. 3. Slowly move your weight onto your front foot until you feel a stretch across your chest and in the front of your shoulders. Keep your head and chest upright and keep your abdominal muscles tight. 4. Hold for __________ seconds. 5. To release the stretch, shift your weight to your back foot. Repeat __________ times. Complete this stretch __________ times a day. Exercise F:Extension, Standing 1. Stand and hold a broomstick, a cane, or a similar object behind your back. ? Your hands should be a little wider than shoulder-width apart. ? Your palms should face away from your back. 2. Keeping your elbows straight and keeping your shoulder muscles relaxed, move the stick away from your body until you feel a stretch in your shoulder. ? Avoid shrugging your shoulders while you move the stick. Keep your shoulder blade tucked down toward the middle of your back. 3. Hold for __________ seconds. 4. Slowly return to the starting position. Repeat __________ times. Complete this exercise __________ times a day. STRENGTHENING  EXERCISES These exercises build strength and endurance in your shoulder. Endurance is the ability to use your muscles for a long time, even after they get tired. Exercise G:External Rotation  1. Sit in a stable chair without armrests. 2. Secure an exercise band at elbow height on your left / right side. 3. Place a soft object, such as a folded towel or a small pillow, between your left / right upper arm and your body to move your elbow a few inches away (about 10 cm) from your side. 4. Hold the end of the band so it is tight and there is no slack. 5. Keeping your elbow pressed against the soft object, move your left / right forearm out, away from your abdomen. Keep your body steady so only your forearm moves. 6. Hold for __________ seconds. 7. Slowly return to the starting position. Repeat __________ times. Complete this exercise __________ times a day. Exercise H:Shoulder Abduction  1. Sit in a stable chair without armrests, or stand. 2. Hold a __________ weight in your left / right hand, or hold an exercise band with both hands.   3. Start with your arms straight down and your left / right palm facing in, toward your body. 4. Slowly lift your left / right hand out to your side. Do not lift your hand above shoulder height unless your health care provider tells you that this is safe. ? Keep your arms straight. ? Avoid shrugging your shoulder while you do this movement. Keep your shoulder blade tucked down toward the middle of your back. 5. Hold for __________ seconds. 6. Slowly lower your arm, and return to the starting position. Repeat __________ times. Complete this exercise __________ times a day. Exercise I:Shoulder Extension 1. Sit in a stable chair without armrests, or stand. 2. Secure an exercise band to a stable object in front of you where it is at shoulder height. 3. Hold one end of the exercise band in each hand. Your palms should face each other. 4. Straighten your elbows and  lift your hands up to shoulder height. 5. Step back, away from the secured end of the exercise band, until the band is tight and there is no slack. 6. Squeeze your shoulder blades together as you pull your hands down to the sides of your thighs. Stop when your hands are straight down by your sides. Do not let your hands go behind your body. 7. Hold for __________ seconds. 8. Slowly return to the starting position. Repeat __________ times. Complete this exercise __________ times a day. Exercise J:Standing Shoulder Row 1. Sit in a stable chair without armrests, or stand. 2. Secure an exercise band to a stable object in front of you so it is at waist height. 3. Hold one end of the exercise band in each hand. Your palms should be in a thumbs-up position. 4. Bend each of your elbows to an "L" shape (about 90 degrees) and keep your upper arms at your sides. 5. Step back until the band is tight and there is no slack. 6. Slowly pull your elbows back behind you. 7. Hold for __________ seconds. 8. Slowly return to the starting position. Repeat __________ times. Complete this exercise __________ times a day. Exercise K:Shoulder Press-Ups  1. Sit in a stable chair that has armrests. Sit upright, with your feet flat on the floor. 2. Put your hands on the armrests so your elbows are bent and your fingers are pointing forward. Your hands should be about even with the sides of your body. 3. Push down on the armrests and use your arms to lift yourself off of the chair. Straighten your elbows and lift yourself up as much as you comfortably can. ? Move your shoulder blades down, and avoid letting your shoulders move up toward your ears. ? Keep your feet on the ground. As you get stronger, your feet should support less of your body weight as you lift yourself up. 4. Hold for __________ seconds. 5. Slowly lower yourself back into the chair. Repeat __________ times. Complete this exercise __________ times a  day. Exercise L: Wall Push-Ups  1. Stand so you are facing a stable wall. Your feet should be about one arm-length away from the wall. 2. Lean forward and place your palms on the wall at shoulder height. 3. Keep your feet flat on the floor as you bend your elbows and lean forward toward the wall. 4. Hold for __________ seconds. 5. Straighten your elbows to push yourself back to the starting position. Repeat __________ times. Complete this exercise __________ times a day. This information is not intended to replace advice   given to you by your health care provider. Make sure you discuss any questions you have with your health care provider. Document Released: 06/30/2005 Document Revised: 05/10/2016 Document Reviewed: 04/27/2015 Elsevier Interactive Patient Education  2018 [employer]  

## 2017-09-08 NOTE — Progress Notes (Signed)
Patient: Vanessa Carlson Female    DOB: 06-27-1961   57 y.o.   MRN: [id number] Visit Date: 09/09/2017  Today's Provider: Lavon Paganini, MD   I, Martha Clan, CMA, am acting as scribe for Lavon Paganini, MD.  Chief Complaint  Patient presents with  . ER Follow Up   Subjective:    HPI     Follow up ER visit  Patient was seen in ER for chest pain on 08/31/2017. She was treated for chest wall pain, lung mass. PET scan was done today for lung mass, and showed hypermetabolic 2.5 cm spiculated RUL nodule, consistent with bronchogenic carcinoma. No mets. She has an appointment with oncology tomorrow. Chest wall pain has improved and seems to be going away.  Still feels it when moving in certain ways.  ------------------------------------------------------------------------------------ Pt is c/o right shoulder pain x 3 weeks. Is worsening. Was taking oxycodone for an related problem, without relief. Was hesitant to mix medications while on Oxy. She has also tried shoulder PT exercises, without relief.  Would like steroid injection today.  She has had these before.  She has known bone spurs, arthritis and some impingement syndrome.  Has increase coumadin dose to 8.5mg  daily.  Thinks she is taking every day and not missing doses because pill divider is empty at end of week.  Last INR 1.4.  Was given single dose of Lovenox in ED.   No Known Allergies   Current Outpatient Medications:  .  aspirin 81 MG chewable tablet, Chew 81 mg by mouth daily., Disp: , Rfl:  .  atorvastatin (LIPITOR) 80 MG tablet, Take 80 mg by mouth daily., Disp: , Rfl:  .  fluticasone (FLONASE) 50 MCG/ACT nasal spray, Place 2 sprays into both nostrils as needed for allergies or rhinitis., Disp: , Rfl:  .  furosemide (LASIX) 40 MG tablet, TAKE 1 TABLET BY MOUTH THREE TIMES DAILY, Disp: 270 tablet, Rfl: 3 .  metoprolol succinate (TOPROL-XL) 50 MG 24 hr tablet, Take 1/2 tablet (25 mg) by mouth two times a  day. Take with or immediately following a meal., Disp: 90 tablet, Rfl: 3 .  nitroGLYCERIN (NITROSTAT) 0.3 MG SL tablet, Place 1 tablet (0.3 mg total) under the tongue every 5 (five) minutes as needed for chest pain., Disp: 30 tablet, Rfl: 3 .  oxyCODONE-acetaminophen (ROXICET) 5-325 MG tablet, Take 1 tablet by mouth every 6 (six) hours as needed., Disp: 8 tablet, Rfl: 0 .  sacubitril-valsartan (ENTRESTO) 24-26 MG, Take 1 tablet by mouth 2 (two) times daily., Disp: 60 tablet, Rfl: 5 .  spironolactone (ALDACTONE) 25 MG tablet, Take 1 tablet (25 mg total) by mouth daily., Disp: 90 tablet, Rfl: 3 .  traMADol (ULTRAM) 50 MG tablet, Take 1 tablet (50 mg total) every 6 (six) hours as needed by mouth., Disp: 30 tablet, Rfl: 5 .  warfarin (COUMADIN) 1 MG tablet, Take 1 tablet (1 mg total) by mouth as directed., Disp: 30 tablet, Rfl: 2 .  warfarin (COUMADIN) 2.5 MG tablet, Take 8.5 mg PO daily on Mon, Wed, Fri, and 7.5 mg PO daily on Tues, Thurs, Sat, Sun, Disp: 90 tablet, Rfl: 1 .  warfarin (COUMADIN) 7.5 MG tablet, Take 8.5 mg PO daily on Mon, Wed, Fri, and 7.5 mg PO daily on Tues, Thurs, Sat, Sun, Disp: 90 tablet, Rfl: 1 .  enoxaparin (LOVENOX) 100 MG/ML injection, Inject 1 mL (100 mg total) into the skin every 12 (twelve) hours for 7 days. (Patient not taking: Reported on  09/09/2017), Disp: 14 mL, Rfl: 0  Review of Systems  Constitutional: Negative.   HENT: Negative.   Respiratory: Negative for cough, chest tightness and shortness of breath.   Cardiovascular: Positive for palpitations. Negative for chest pain and leg swelling.  Gastrointestinal: Negative.   Genitourinary: Negative.   Musculoskeletal: Positive for arthralgias and myalgias. Negative for gait problem.  Neurological: Negative.   Psychiatric/Behavioral: Negative.     Social History   Tobacco Use  . Smoking status: Former Smoker    Packs/day: 1.00    Years: 4.00    Pack years: 4.00    Types: Cigarettes    Last attempt to quit: 1998     Years since quitting: 21.0  . Smokeless tobacco: Never Used  Substance Use Topics  . Alcohol use: No   Objective:   BP 112/72 (BP Location: Left Arm, Patient Position: Sitting, Cuff Size: Large)   Pulse 84   Temp 98.3 F (36.8 C) (Oral)   Resp 16   Wt 217 lb (98.4 kg)   LMP  (LMP Unknown)   SpO2 98%   BMI 39.69 kg/m  Vitals:   09/08/17 1349  BP: 112/72  Pulse: 84  Resp: 16  Temp: 98.3 F (36.8 C)  TempSrc: Oral  SpO2: 98%  Weight: 217 lb (98.4 kg)     Physical Exam  Constitutional: She is oriented to person, place, and time. She appears well-developed and well-nourished. No distress.  Cardiovascular: Normal rate, regular rhythm, normal heart sounds and intact distal pulses.  No murmur heard. Pulmonary/Chest: Effort normal and breath sounds normal. No respiratory distress. She has no wheezes. She has no rales. She exhibits no tenderness.  Musculoskeletal: She exhibits no edema.  R Shoulder: Inspection reveals no abnormalities, atrophy or asymmetry. Palpation is normal, + TTP over acromion. ROM is full in all planes. Rotator cuff strength normal throughout. +Hawkins Speeds and Yergason's tests normal. Normal scapular function observed. No painful arc and no drop arm sign. No apprehension sign  Neurological: She is alert and oriented to person, place, and time.  Skin: Skin is warm and dry. No rash noted.  Psychiatric: She has a normal mood and affect. Her behavior is normal.  Vitals reviewed.       Assessment & Plan:      Problem List Items Addressed This Visit      Cardiovascular and Mediastinum   Paroxysmal atrial fibrillation (HCC)    Pulled for INR check, please see plan for coumadin under mitral valve replacement below      Relevant Medications   enoxaparin (LOVENOX) 100 MG/ML injection     Other   Status post mitral valve replacement    INR remains subtherapeutic at 1.7 today Patient denies missed doses, but continue to wonder about  noncompliance as an issue since INR fluctuates so much Will adjust dose to 10mg  on T/T/Sun and 8.5mg  daily on other days Lovenox given to bridge as she has mechanical heart valve Goal INR 2.5-3.5 Recheck next week      Right shoulder pain - Primary    C/w subacromial bursitis with some impingement signs Suspect there is likely some OA in [facility] joint as well Rotator cuff motion and strength intake HEP given again Avoid NSAIDs given anticoagulation and CHF Tylenol, ice for pain Subacromial steroid injection performed today (see procedure note)      Chest wall pain    Improving  Continue to monitor      Mass of upper lobe of right lung  Has appt with oncology tomorrow Will await their recommendations         Procedure: Discussed risks and benefits.  Obtained written consent.  A steroid injection was performed at R shoulder subacromial with posterior approach using 3mg  1% plain Lidocaine and 80 mg of Dpeo-Medrol. This was well tolerated.   Return in about 3 months (around 12/07/2017) for chronic disease f/u.     The entirety of the information documented in the History of Present Illness, Review of Systems and Physical Exam were personally obtained by me. Portions of this information were initially documented by Raquel Sarna Ratchford, CMA and reviewed by me for thoroughness and accuracy.    Virginia Crews, MD, MPH [facility] 09/09/2017 10:30 AM

## 2017-09-09 ENCOUNTER — Telehealth: Payer: Self-pay | Admitting: Internal Medicine

## 2017-09-09 ENCOUNTER — Telehealth: Payer: Self-pay

## 2017-09-09 ENCOUNTER — Ambulatory Visit (INDEPENDENT_AMBULATORY_CARE_PROVIDER_SITE_OTHER): Payer: Medicare Other | Admitting: Cardiothoracic Surgery

## 2017-09-09 ENCOUNTER — Encounter: Payer: Self-pay | Admitting: Cardiothoracic Surgery

## 2017-09-09 ENCOUNTER — Inpatient Hospital Stay: Payer: Medicare Other | Attending: Hematology and Oncology | Admitting: Hematology and Oncology

## 2017-09-09 VITALS — BP 126/79 | HR 80 | Temp 96.5°F | Resp 20 | Wt 215.4 lb

## 2017-09-09 VITALS — BP 128/83 | HR 80 | Temp 98.2°F | Ht 62.0 in | Wt 215.0 lb

## 2017-09-09 DIAGNOSIS — I255 Ischemic cardiomyopathy: Secondary | ICD-10-CM | POA: Diagnosis not present

## 2017-09-09 DIAGNOSIS — Z952 Presence of prosthetic heart valve: Secondary | ICD-10-CM

## 2017-09-09 DIAGNOSIS — Z95810 Presence of automatic (implantable) cardiac defibrillator: Secondary | ICD-10-CM | POA: Diagnosis not present

## 2017-09-09 DIAGNOSIS — Z87891 Personal history of nicotine dependence: Secondary | ICD-10-CM | POA: Insufficient documentation

## 2017-09-09 DIAGNOSIS — R918 Other nonspecific abnormal finding of lung field: Secondary | ICD-10-CM | POA: Diagnosis not present

## 2017-09-09 DIAGNOSIS — I110 Hypertensive heart disease with heart failure: Secondary | ICD-10-CM | POA: Diagnosis not present

## 2017-09-09 DIAGNOSIS — C3411 Malignant neoplasm of upper lobe, right bronchus or lung: Secondary | ICD-10-CM | POA: Diagnosis not present

## 2017-09-09 DIAGNOSIS — Z8541 Personal history of malignant neoplasm of cervix uteri: Secondary | ICD-10-CM | POA: Diagnosis not present

## 2017-09-09 DIAGNOSIS — I2510 Atherosclerotic heart disease of native coronary artery without angina pectoris: Secondary | ICD-10-CM | POA: Diagnosis not present

## 2017-09-09 DIAGNOSIS — F17211 Nicotine dependence, cigarettes, in remission: Secondary | ICD-10-CM

## 2017-09-09 DIAGNOSIS — I252 Old myocardial infarction: Secondary | ICD-10-CM | POA: Diagnosis not present

## 2017-09-09 DIAGNOSIS — I099 Rheumatic heart disease, unspecified: Secondary | ICD-10-CM

## 2017-09-09 DIAGNOSIS — Z7901 Long term (current) use of anticoagulants: Secondary | ICD-10-CM | POA: Diagnosis not present

## 2017-09-09 MED ORDER — METHYLPREDNISOLONE ACETATE 80 MG/ML IJ SUSP
80.0000 mg | Freq: Once | INTRAMUSCULAR | Status: AC
  Administered 2017-09-08: 80 mg via INTRAMUSCULAR

## 2017-09-09 MED ORDER — LIDOCAINE HCL (PF) 1 % IJ SOLN
3.0000 mL | Freq: Once | INTRAMUSCULAR | Status: AC
  Administered 2017-09-08: 3 mL via INTRADERMAL

## 2017-09-09 NOTE — Progress Notes (Signed)
Patient here today as new evaluation regarding lung cancer.  Patient referred by ED.  PET scan yesterday.  When patient is finished with oncology appointment today please call Burgess Estelle, Nurse Navigator.  Patient will be seeing Dr. Genevive Bi.

## 2017-09-09 NOTE — Telephone Encounter (Signed)
Cardiac and Anticoagulant clearance have been faxed.

## 2017-09-09 NOTE — Assessment & Plan Note (Signed)
INR remains subtherapeutic at 1.7 today Patient denies missed doses, but continue to wonder about noncompliance as an issue since INR fluctuates so much Will adjust dose to 10mg  on T/T/Sun and 8.5mg  daily on other days Lovenox given to bridge as she has mechanical heart valve Goal INR 2.5-3.5 Recheck next week

## 2017-09-09 NOTE — Telephone Encounter (Signed)
The patient has an appointment on 09/13/17 with Ignacia Bayley, NP for clearance.

## 2017-09-09 NOTE — Addendum Note (Signed)
Addended by: Brennan Bailey on: 09/09/2017 12:04 PM   Modules accepted: Orders

## 2017-09-09 NOTE — Patient Instructions (Addendum)
We have recommended today that you have a CT Guided Biopsy done. We will call you as soon as we have the details available for this appointment.  We will see you back in our office after this testing has been completed and pathology has been received. Please see appointment information below.  Prior to having Biopsy done, you will need to have labs drawn. I will let you know when this will occur after finding out the date of the biopsy. Also, you may not eat or drink 6 hours prior to the procedure and may not take any Aspirin or Ibuprofen containing products the day of the procedure. If you are on any other Blood Thinners that we are unaware of or that we have not reviewed in the office, please bring this to the nurse's attention.  Call our office with any questions or concerns that you may have.  We have also sent over Cardiac and Anticoagulant Clearance to Dr. Darnelle Bos office.  We have also scheduled you an appointment to be seen by Dr. Saunders Revel on 1/15 at 10:30AM.  If you have any questions or concerns please give our office a call.

## 2017-09-09 NOTE — Assessment & Plan Note (Signed)
Has appt with oncology tomorrow Will await their recommendations

## 2017-09-09 NOTE — Progress Notes (Signed)
Patient ID: Vanessa Carlson, female   DOB: 09-09-1960, 57 y.o.   MRN: [id number]  Chief Complaint  Patient presents with  . New Patient (Initial Visit)    New patient-referral from [facility]-right upper lobe mass-PET scan scheduled for 1/9.   Dr. Mike Gip Referred By Dr. Mike Gip Reason for Referral right upper lobe mass  HPI Location, Quality, Duration, Severity, Timing, Context, Modifying Factors, Associated Signs and Symptoms.  Vanessa Carlson is a 57 y.o. female.  She was in her usual state of health until earlier this month when she experienced some progressive left-sided chest pain.  She has an extensive cardiac history having undergone mitral valve replacement about 15 years ago.  She then had a myocardial infarction about 3 years ago and underwent a cardiac cath and was told that she did not require any intervention.  The details of that are unavailable to my review.  This was done up in [place].  The patient presented to the emergency department for management of her left-sided chest pain where chest x-ray was performed.  This was abnormal showing a right upper lobe mass and a subsequent CT scan and PET scan were performed.  The CT scan showed a 3 cm right upper lobe mass and this was PET positive without evidence of distant disease.  She has not had a biopsy of this mass.  She is followed by her cardiologist and had a recent echocardiogram showing a functioning mitral prosthesis with an ejection fraction of approximately 20-25%.  She also has a history of cervical cancer and is status post hysterectomy.  She smoked for a short period of time when she was a young adult but has not smoked in over 20 years.  She currently is on Coumadin and aspirin.  She does not complain of any significant shortness of breath although she is not as active as she would like to be.  She had some pulmonary function studies done which revealed an FEV1 of between 70 and 75%.   Past Medical History:   Diagnosis Date  . Anterior myocardial infarction [facility])    a. 10/2014 - occluded LAD, complicated by cardiogenic shock-->Med Rx as interventional team was unable to open LAD.  [name] Cervical cancer ([place])    in remission for ~20 yrs, s/p hysterectomy  . Chronic combined systolic and diastolic CHF (congestive heart failure) ([place])    a. 10/2015 Echo: EF 20-25%, sev diast dysfxn, mildly reduced RV fxn. Nl MV prosthesis.  . Coronary artery disease    a. 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  . Essential hypertension   . Ischemic cardiomyopathy    a. 03/2015 s/p MDT single lead AICD;  b. 10/2015 Echo: EF 20-25%.  . Mitral valve disease    a. 2006 s/p MVR w/ 32mm SJM bileaflet mechanical valve-->chronic coumadin/ASA.    Past Surgical History:  Procedure Laterality Date  . APPENDECTOMY  1998  . CARDIAC CATHETERIZATION    . CARDIAC DEFIBRILLATOR PLACEMENT    . RADICAL HYSTERECTOMY  1998  . SHOULDER SURGERY Bilateral    rotator cuff tears  . VALVE REPLACEMENT     Mitral valve; 27 mm St. Jude bileaflet valve    Family History  Problem Relation Age of Onset  . Heart attack Mother   . Hypertension Mother   . Diabetes Mother   . Emphysema Father        smoker  . Diabetes Maternal Grandmother   .  Kidney disease Maternal Grandmother   . Breast cancer Maternal Aunt   . Breast cancer Maternal Aunt   . Colon cancer Maternal Uncle     Social History Social History   Tobacco Use  . Smoking status: Former Smoker    Packs/day: 1.00    Years: 4.00    Pack years: 4.00    Types: Cigarettes    Last attempt to quit: 1998    Years since quitting: 21.0  . Smokeless tobacco: Never Used  Substance Use Topics  . Alcohol use: No  . Drug use: No    No Known Allergies  Current Outpatient Medications  Medication Sig Dispense Refill  . aspirin 81 MG chewable tablet Chew 81 mg by mouth daily.    [name]  atorvastatin (LIPITOR) 80 MG tablet Take 80 mg by mouth daily.    [name] enoxaparin (LOVENOX) 100 MG/ML injection Inject 1 mL (100 mg total) into the skin every 12 (twelve) hours for 7 days. 14 mL 0  . fluticasone (FLONASE) 50 MCG/ACT nasal spray Place 2 sprays into both nostrils as needed for allergies or rhinitis.    . furosemide (LASIX) 40 MG tablet TAKE 1 TABLET BY MOUTH THREE TIMES DAILY 270 tablet 3  . metoprolol succinate (TOPROL-XL) 50 MG 24 hr tablet Take 1/2 tablet (25 mg) by mouth two times a day. Take with or immediately following a meal. 90 tablet 3  . nitroGLYCERIN (NITROSTAT) 0.3 MG SL tablet Place 1 tablet (0.3 mg total) under the tongue every 5 (five) minutes as needed for chest pain. 30 tablet 3  . oxyCODONE-acetaminophen (ROXICET) 5-325 MG tablet Take 1 tablet by mouth every 6 (six) hours as needed. 8 tablet 0  . sacubitril-valsartan (ENTRESTO) 24-26 MG Take 1 tablet by mouth 2 (two) times daily. 60 tablet 5  . spironolactone (ALDACTONE) 25 MG tablet Take 1 tablet (25 mg total) by mouth daily. 90 tablet 3  . traMADol (ULTRAM) 50 MG tablet Take 1 tablet (50 mg total) every 6 (six) hours as needed by mouth. 30 tablet 5  . warfarin (COUMADIN) 1 MG tablet Take 1 tablet (1 mg total) by mouth as directed. 30 tablet 2  . warfarin (COUMADIN) 2.5 MG tablet Take 8.5 mg PO daily on Mon, Wed, Fri, and 7.5 mg PO daily on Tues, Thurs, Sat, Sun 90 tablet 1  . warfarin (COUMADIN) 7.5 MG tablet Take 8.5 mg PO daily on Mon, Wed, Fri, and 7.5 mg PO daily on Tues, Thurs, Sat, Sun 90 tablet 1   No current facility-administered medications for this visit.       Review of Systems A complete review of systems was asked and was negative except for the following positive findings irregular heartbeat, swelling of her ankles.  Joint pain.  Blood pressure 128/83, pulse 80, temperature 98.2 F (36.8 C), temperature source Oral, height 5\' 2"  (1.575 m), weight 215 lb (97.5 kg).  Physical Exam CONSTITUTIONAL:   Pleasant, well-developed, well-nourished, and in no acute distress. EYES: Pupils equal and reactive to light, Sclera non-icteric EARS, NOSE, MOUTH AND THROAT:  The oropharynx was clear.  Dentition is good repair.  Oral mucosa pink and moist. LYMPH NODES:  Lymph nodes in the neck and axillae were normal RESPIRATORY:  Lungs were clear.  Normal respiratory effort without pathologic use of accessory muscles of respiration CARDIOVASCULAR: Heart was regular without murmurs but there were prosthetic heart sounds.  They were crisp.  There were no carotid bruits but transmitted cardiac sounds. GI: The abdomen  was soft, nontender, and nondistended. There were no palpable masses. There was no hepatosplenomegaly. There were normal bowel sounds in all quadrants. GU:  Rectal deferred.   MUSCULOSKELETAL:  Normal muscle strength and tone.  No clubbing or cyanosis.   SKIN:  There were no pathologic skin lesions.  There were no nodules on palpation.  There is extensive keloid along her median sternotomy incision but not her chest tube incisions. NEUROLOGIC:  Sensation is normal.  Cranial nerves are grossly intact. PSYCH:  Oriented to person, place and time.  Mood and affect are normal.  Data Reviewed CT scan, PET scan and pulmonary function studies  I have personally reviewed the patient's imaging, laboratory findings and medical records.    Assessment    Right upper lobe nodule most consistent with lung cancer.    Plan    I had a long discussion with her and her family today.  I do believe that she most likely has a malignancy in her right upper lobe.  The PET scan would suggest that there is no evidence of distant disease.  I reviewed with her the indications and risks of surgical and nonsurgical management of early stage lung cancer.  She would like to have a CT-guided needle biopsy done.  This will be performed.  Also get her to see our cardiologist to give [place] some assessment of her cardiac risk factors.   I did explain to her that our ability to manage all of her medical problems may be somewhat limited at our institution due to the necessary personnel.  She understands that.  We will schedule her biopsy and cardiology appointment and I will see her back after.       Nestor Lewandowsky, MD 09/09/2017, 11:46 AM

## 2017-09-09 NOTE — Progress Notes (Signed)
[facility] day:  09/09/2017  Chief Complaint: Vanessa Carlson is a 57 y.o. female with a right upper lobe mass who is referred in consultation by Dr. Charlotte Crumb for assessment and management.  HPI:  The patient went to [facility] emergency room on 08/31/2017 with a 3-day history of chest pain.  She has a history of MI (10/2014), ischemic cardiomyopathy (s/p single lead AICD in 03/2015 and EF 20-25% in 10/2015) and rheumatic heart disease s/p mechanical mitral valve replacement (2006).  She is on Coumadin.  She has a limited smoking history (4 pack year; quit in 1998).  Labs on 08/31/2017 revealed a hematocrit of 39.2, hemoglobin 13.3, MCV 84.3, platelets 151,000, and WBC 5200.  Creatinine was 1.06.  INR was 1.34.  A subtherapeutic INR was known earlier in the day with a subsequent adjustment in her Coumadin.  She was given a dose of Lovenox.  Chest CT angiogram on 08/31/2017 revealed a 2.8 x 2.0 cm RUL spiculated mass along the minor fissure.  There was retraction of the adjacent fissure.  PET scan on 09/07/2017 revealed a 2.5 cm hypermetabolic (SUV 51.7) spiculated RUL nodule c/w bronchogenic carcinoma.  There was no evidence of thoracic lymph node or distant metastatic disease.  She underwent pulmonary function testing on 09/08/2017 with an FVC of 2.81 liters (92%), FEV1 1.6 liters (69%), and FEV1/FVC 62%.    Symptomatically, she denies any chest pain.  She has shortness of breath with exertion (flight of stairs).  She has a history of early cervical cancer s/p hysterectomy approximately 20 years ago.   Past Medical History:  Diagnosis Date  . Anterior myocardial infarction [facility])    a. 10/2014 - occluded LAD, complicated by cardiogenic shock-->Med Rx as interventional team was unable to open LAD.  [name] Cervical cancer ([place])    in remission for ~20 yrs, s/p hysterectomy  . Chronic combined systolic and diastolic CHF (congestive heart failure) ([place])     a. 10/2015 Echo: EF 20-25%, sev diast dysfxn, mildly reduced RV fxn. Nl MV prosthesis.  . Coronary artery disease    a. 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  . Essential hypertension   . Ischemic cardiomyopathy    a. 03/2015 s/p MDT single lead AICD;  b. 10/2015 Echo: EF 20-25%.  . Mitral valve disease    a. 2006 s/p MVR w/ 50mm SJM bileaflet mechanical valve-->chronic coumadin/ASA.    Past Surgical History:  Procedure Laterality Date  . APPENDECTOMY  1998  . CARDIAC CATHETERIZATION    . CARDIAC DEFIBRILLATOR PLACEMENT    . RADICAL HYSTERECTOMY  1998  . SHOULDER SURGERY Bilateral    rotator cuff tears  . VALVE REPLACEMENT     Mitral valve; 27 mm St. Jude bileaflet valve    Family History  Problem Relation Age of Onset  . Heart attack Mother   . Hypertension Mother   . Diabetes Mother   . Emphysema Father        smoker  . Diabetes Maternal Grandmother   . Kidney disease Maternal Grandmother   . Breast cancer Maternal Aunt   . Breast cancer Maternal Aunt   . Colon cancer Maternal Uncle     Social History:  reports that she quit smoking about 21 years ago. Her smoking use included cigarettes. She has a 4.00 pack-year smoking history. she has never used smokeless tobacco. She reports that she does  not drink alcohol or use drugs.  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  She moved to [place] 1 1/2 years ago.  She lives in [place].  The patient is accompanied by her son and daughter today.  Allergies: No Known Allergies  Current Medications: Current Outpatient Medications  Medication Sig Dispense Refill  . aspirin 81 MG chewable tablet Chew 81 mg by mouth daily.    [name] atorvastatin (LIPITOR) 80 MG tablet Take 80 mg by mouth daily.    . fluticasone (FLONASE) 50 MCG/ACT nasal spray Place 2 sprays into both nostrils as needed for allergies or rhinitis.    . furosemide (LASIX)  40 MG tablet TAKE 1 TABLET BY MOUTH THREE TIMES DAILY 270 tablet 3  . metoprolol succinate (TOPROL-XL) 50 MG 24 hr tablet Take 1/2 tablet (25 mg) by mouth two times a day. Take with or immediately following a meal. 90 tablet 3  . nitroGLYCERIN (NITROSTAT) 0.3 MG SL tablet Place 1 tablet (0.3 mg total) under the tongue every 5 (five) minutes as needed for chest pain. 30 tablet 3  . oxyCODONE-acetaminophen (ROXICET) 5-325 MG tablet Take 1 tablet by mouth every 6 (six) hours as needed. 8 tablet 0  . sacubitril-valsartan (ENTRESTO) 24-26 MG Take 1 tablet by mouth 2 (two) times daily. 60 tablet 5  . spironolactone (ALDACTONE) 25 MG tablet Take 1 tablet (25 mg total) by mouth daily. 90 tablet 3  . traMADol (ULTRAM) 50 MG tablet Take 1 tablet (50 mg total) every 6 (six) hours as needed by mouth. 30 tablet 5  . warfarin (COUMADIN) 1 MG tablet Take 1 tablet (1 mg total) by mouth as directed. 30 tablet 2  . warfarin (COUMADIN) 2.5 MG tablet Take 8.5 mg PO daily on Mon, Wed, Fri, and 7.5 mg PO daily on Tues, Thurs, Sat, Sun 90 tablet 1  . warfarin (COUMADIN) 7.5 MG tablet Take 8.5 mg PO daily on Mon, Wed, Fri, and 7.5 mg PO daily on Tues, Thurs, Sat, Sun 90 tablet 1  . enoxaparin (LOVENOX) 100 MG/ML injection Inject 1 mL (100 mg total) into the skin every 12 (twelve) hours for 7 days. 14 mL 0   No current facility-administered medications for this visit.     Review of Systems:  GENERAL:  Feels good.  Active.  No fevers, sweats or weight loss. PERFORMANCE STATUS (ECOG):  2 HEENT:  No visual changes, runny nose, sore throat, mouth sores or tenderness. Lungs: Shortness of breath with exertion.  No cough.  No hemoptysis. Cardiac:  Cardiomyopathy.  s/p mitral valve replacement.  No chest pain, palpitations, orthopnea, or PND. Breasts:  No concerns.  Mammogram in 05/2016 in [place]. GI:  No nausea, vomiting, diarrhea, constipation, melena or hematochezia.  Colonoscopy in 05/2016 in [place]. GU:  No urgency,  frequency, dysuria, or hematuria. Musculoskeletal:  No back pain.  Arthritis knees and shoulders.  Right shoulder pain s/p injection yesterday.  No muscle tenderness. Extremities:  No pain or swelling. Skin:  No rashes or skin changes. Neuro:  No headache, numbness or weakness, balance or coordination issues. Endocrine:  No diabetes, thyroid issues, hot flashes or night sweats. Psych:  No mood changes, depression or anxiety. Pain:  No focal pain. Review of systems:  All other systems reviewed and found to be negative.  Physical Exam: Blood pressure 126/79, pulse 80, temperature (!) 96.5 F (35.8 C), temperature source Tympanic, resp. rate 20, weight 215 lb 7 oz (97.7 kg), SpO2 97 %. GENERAL:  Well  developed, well nourished, woman sitting comfortably in the exam room in no acute distress. MENTAL STATUS:  Alert and oriented to person, place and time. HEAD:  Black hair with slight graying.  Normocephalic, atraumatic, face symmetric, no Cushingoid features. EYES:  Brown eyes.  Pupils equal round and reactive to light and accomodation.  No conjunctivitis or scleral icterus. ENT:  Oropharynx clear without lesion.  Tongue normal.  Upper dentures.  Mucous membranes moist.  RESPIRATORY:  Clear to auscultation without rales, wheezes or rhonchi. CARDIOVASCULAR:  Regular rate and rhythm with mechanical valve sound in mitral position.  No murmur, rub or gallop. ABDOMEN:  Soft, non-tender, with active bowel sounds, and no hepatosplenomegaly.  No masses. SKIN:  No rashes, ulcers or lesions. EXTREMITIES: No edema, no skin discoloration or tenderness.  No palpable cords. LYMPH NODES: No palpable cervical, supraclavicular, axillary or inguinal adenopathy  NEUROLOGICAL: Unremarkable. PSYCH:  Appropriate.   Office Visit on 09/08/2017  Component Date Value Ref Range Status  . INR 09/08/2017 1.7   Final  Hospital Outpatient Visit on 09/07/2017  Component Date Value Ref Range Status  . Glucose-Capillary  09/07/2017 118* 65 - 99 mg/dL Final    Assessment:  Vanessa Carlson is a 57 y.o. female with a right upper lobe mass.  She presented with chest pain.  She has a 4 pack year smoking history (quit 1998).  Chest CT angiogram on 08/31/2017 revealed a 2.8 x 2.0 cm RUL spiculated mass along the minor fissure.  There was retraction of the adjacent fissure.  PET scan on 09/07/2017 revealed a 2.5 cm hypermetabolic (SUV 48.2) spiculated RUL nodule c/w bronchogenic carcinoma.  There was no evidence of thoracic lymph node or distant metastatic disease.  She has a history of MI (10/2014), ischemic cardiomyopathy (s/p single lead AICD in 03/2015 and EF 20-25% in 10/2015) and rheumatic heart disease s/p mechanical mitral valve replacement (2006).  She is on Coumadin.   Symptomatically, she denies any chest pain.  She has shortness of breath with 1 flight of stairs.  Plan: 1.  Discuss recent chest CT angiogram and PET scan.  PET scan images reviewed with patient and her family.  Discuss likely diagnosis of lung cancer.  Discuss diagnosis, staging, and management of lung cancer.  Clinically, she has a T1cN0M0 lesion.  Typically, she would be a candidate for lung resection, but concern is raised given her underlying cardiac history.  Await surgical opinion.  If she is not a candidate for surgery, she would require a biopsy and possible consideration of stereotactic radiosurgery. 2.  Consult with Dr Genevive Bi today. 3.  RTC after disposition by Dr. Genevive Bi.   Lequita Asal, MD  09/09/2017, 3:35 PM

## 2017-09-09 NOTE — Telephone Encounter (Signed)
° °  [facility] Pre-operative Risk Assessment    Request for surgical clearance:  1. What type of surgery is being performed? CT Guided Lung Biopsy   2. When is this surgery scheduled? TBD  3. Are there any medications that need to be held prior to surgery and how long? Toprol XL   4. Practice name and name of physician performing surgery? [facility]   5. What is your office phone and fax number? [phone] fax [phone]  6. Anesthesia type (None, local, MAC, general) ?unknown     _________________________________________________________________   (provider comments below)

## 2017-09-09 NOTE — Assessment & Plan Note (Signed)
Improving  Continue to monitor

## 2017-09-09 NOTE — Telephone Encounter (Signed)
Call made to [facility] at speciality scheduling she has advised me that she will send patient's information over for review, and once she has it scheduled she will give [place] a call.    Call made to Dr. Saunders Revel office to schedule patient an appointment to be seen sooner for a Cardiac Clearance as well as Anti-coagulant Clearance. I was able to get patient an appointment for 1/15. I have informed patient of this appointment.

## 2017-09-09 NOTE — Assessment & Plan Note (Signed)
Pulled for INR check, please see plan for coumadin under mitral valve replacement below

## 2017-09-09 NOTE — Assessment & Plan Note (Signed)
C/w subacromial bursitis with some impingement signs Suspect there is likely some OA in [facility] joint as well Rotator cuff motion and strength intake HEP given again Avoid NSAIDs given anticoagulation and CHF Tylenol, ice for pain Subacromial steroid injection performed today (see procedure note)

## 2017-09-11 ENCOUNTER — Encounter: Payer: Self-pay | Admitting: Hematology and Oncology

## 2017-09-13 ENCOUNTER — Encounter: Payer: Self-pay | Admitting: Nurse Practitioner

## 2017-09-13 ENCOUNTER — Telehealth: Payer: Self-pay | Admitting: Cardiothoracic Surgery

## 2017-09-13 ENCOUNTER — Ambulatory Visit (INDEPENDENT_AMBULATORY_CARE_PROVIDER_SITE_OTHER): Payer: Medicare Other | Admitting: Nurse Practitioner

## 2017-09-13 VITALS — BP 108/56 | HR 66 | Ht 62.5 in | Wt 214.5 lb

## 2017-09-13 DIAGNOSIS — Z952 Presence of prosthetic heart valve: Secondary | ICD-10-CM | POA: Diagnosis not present

## 2017-09-13 DIAGNOSIS — Z01810 Encounter for preprocedural cardiovascular examination: Secondary | ICD-10-CM | POA: Diagnosis not present

## 2017-09-13 DIAGNOSIS — E785 Hyperlipidemia, unspecified: Secondary | ICD-10-CM | POA: Diagnosis not present

## 2017-09-13 DIAGNOSIS — I255 Ischemic cardiomyopathy: Secondary | ICD-10-CM | POA: Diagnosis not present

## 2017-09-13 DIAGNOSIS — I2510 Atherosclerotic heart disease of native coronary artery without angina pectoris: Secondary | ICD-10-CM

## 2017-09-13 DIAGNOSIS — I10 Essential (primary) hypertension: Secondary | ICD-10-CM

## 2017-09-13 DIAGNOSIS — I5022 Chronic systolic (congestive) heart failure: Secondary | ICD-10-CM | POA: Diagnosis not present

## 2017-09-13 NOTE — Telephone Encounter (Signed)
Call made to patient at this time. I spoke with patient and informed her that her CT Guided Lung Biopsy is scheduled for 1/23 at 10AM at [facility]. I advised patient that she will need to have someone to bring her and stay with her the entire time and to take her home. I advised that her follow up appointment with Dr. Genevive Bi will be on 1/25 8:15AM. I stated that I would get a letter mailed out to her with these reminders. Patient verbalized understanding.

## 2017-09-13 NOTE — Patient Instructions (Signed)
Medication Instructions: - Your physician recommends that you continue on your current medications as directed. Please refer to the Current Medication list given to you today.  Labwork: - none ordered  Procedures/Testing: - none ordered  Follow-Up: - Your physician recommends that you schedule a follow-up appointment in: 3 months with Dr. Saunders Revel   Any Additional Special Instructions Will Be Listed Below (If Applicable).     If you need a refill on your cardiac medications before your next appointment, please call your pharmacy.

## 2017-09-13 NOTE — Telephone Encounter (Signed)
Pamala Hurry with scheduling called - Lung Biopsy scheduled 1/23. Patients needs to arrive at 10am.

## 2017-09-13 NOTE — Progress Notes (Signed)
Office Visit    Patient Name: Vanessa Carlson Date of Encounter: 09/13/2017  Primary Care Provider:  Virginia Crews, MD Primary Cardiologist:  [name], MD  Chief Complaint    57 year old female with a history of CAD status post prior anterior MI in March 2016 with known chronic total occlusion of the LAD, chronic combined systolic and diastolic heart failure, ischemic cardiomyopathy, hypertension, mitral valve disease status post mechanical mitral valve replacement in 2006 on chronic Coumadin, cervical cancer, and recent finding of right upper lobe mass pending CT-guided biopsy, who presents for follow-up and preoperative evaluation.  Past Medical History    Past Medical History:  Diagnosis Date  . Anterior myocardial infarction [facility])    a. 10/2014 - occluded LAD, complicated by cardiogenic shock-->Med Rx as interventional team was unable to open LAD.  [name] Cervical cancer ([place])    in remission for ~20 yrs, s/p hysterectomy  . Chronic combined systolic and diastolic CHF (congestive heart failure) ([place])    a. 10/2015 Echo: EF 20-25%, sev diast dysfxn, mildly reduced RV fxn. Nl MV prosthesis.  . Coronary artery disease    a. 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  . Essential hypertension   . Ischemic cardiomyopathy    a. 03/2015 s/p MDT single lead AICD;  b. 10/2015 Echo: EF 20-25%.  . Mass of upper lobe of right lung    a. noted on CXR & CT 08/2016 w/ abnl PET CT.  [name] Mitral valve disease    a. 2006 s/p MVR w/ 10mm SJM bileaflet mechanical valve-->chronic coumadin/ASA.   Past Surgical History:  Procedure Laterality Date  . APPENDECTOMY  1998  . CARDIAC CATHETERIZATION    . CARDIAC DEFIBRILLATOR PLACEMENT    . RADICAL HYSTERECTOMY  1998  . SHOULDER SURGERY Bilateral    rotator cuff tears  . VALVE REPLACEMENT     Mitral valve; 27 mm St. Jude bileaflet valve     Allergies  No Known Allergies  History of Present Illness    57 y/o ? with the above complex PMH including mitral valve disease status post mitral valve replacement in 2006 with a mechanical mitral valve.  She has been on chronic Coumadin and aspirin since.  INRs are followed at the [facility].  Other history includes CAD status post anterior ST segment elevation myocardial infarction in 2016.  The LAD was found to be occluded proximally and this has been medically managed.  In the setting of prior anterior MI, she developed LV dysfunction with an EF of 20-25%.  She underwent ICD placement in August 2016.  Other history includes hypertension, combined heart failure, and cervical cancer status post hysterectomy.  In her spring 2018, she was having intermittent chest discomfort and metoprolol dose was titrated with improvement in symptoms.  When she was seen back in the fall 2018 she was doing well.  In early January of this year, she was in the emergency room with chest wall pain.  She was noted to have a right upper lobe mass on her chest x-ray and CT.  The PET scan subsequently showed a hypermetabolic 2.5 cm spiculated right upper lobe nodule consistent with bronchogenic carcinoma.  She has been seen by thoracic surgery with initial plan for CT guided biopsy.  Pending findings, she thinks that she would personally favor a nonoperative approach-radiation.    From a cardiac standpoint, she reports doing well.  Though  she had some chest wall pain earlier this year, she has had no recurrence of that or any other type of chest pain or angina.  She has been exercising 3 days a week at home and says she walks fairly regularly.  Her Duke activity score index measures 42.7 with a maximum achievable METS of 7.99.  She denies any symptoms or limitations with activity.  She weighs daily and weight has been stable.  She denies palpitations, PND, orthopnea, dizziness, syncope, edema, or early  satiety.  Home Medications    Prior to Admission medications   Medication Sig Start Date End Date Taking? Authorizing Provider  aspirin 81 MG chewable tablet Chew 81 mg by mouth daily.   Yes [provider]  atorvastatin (LIPITOR) 80 MG tablet Take 80 mg by mouth daily.   Yes [provider]  enoxaparin (LOVENOX) 100 MG/ML injection Inject 1 mL (100 mg total) into the skin every 12 (twelve) hours for 7 days. 09/08/17 09/15/17 Yes Bacigalupo, Dionne Bucy, MD  fluticasone (FLONASE) 50 MCG/ACT nasal spray Place 2 sprays into both nostrils as needed for allergies or rhinitis.   Yes [provider]  furosemide (LASIX) 40 MG tablet TAKE 1 TABLET BY MOUTH THREE TIMES DAILY 06/15/17  Yes Darylene Price A, FNP  metoprolol succinate (TOPROL-XL) 50 MG 24 hr tablet Take 1/2 tablet (25 mg) by mouth two times a day. Take with or immediately following a meal. 04/11/17  Yes End, Harrell Gave, MD  nitroGLYCERIN (NITROSTAT) 0.3 MG SL tablet Place 1 tablet (0.3 mg total) under the tongue every 5 (five) minutes as needed for chest pain. 02/08/17  Yes Hackney, Otila Kluver A, FNP  oxyCODONE-acetaminophen (ROXICET) 5-325 MG tablet Take 1 tablet by mouth every 6 (six) hours as needed. 08/31/17  Yes McShane, Gerda Diss, MD  sacubitril-valsartan (ENTRESTO) 24-26 MG Take 1 tablet by mouth 2 (two) times daily. 02/08/17  Yes Darylene Price A, FNP  spironolactone (ALDACTONE) 25 MG tablet Take 1 tablet (25 mg total) by mouth daily. 08/11/17  Yes Bacigalupo, Dionne Bucy, MD  traMADol (ULTRAM) 50 MG tablet Take 1 tablet (50 mg total) every 6 (six) hours as needed by mouth. 07/08/17  Yes Bacigalupo, Dionne Bucy, MD  warfarin (COUMADIN) 1 MG tablet Take 1 tablet (1 mg total) by mouth as directed. 08/10/17  Yes Virginia Crews, MD  warfarin (COUMADIN) 2.5 MG tablet Take 8.5 mg PO daily on Mon, Wed, Fri, and 7.5 mg PO daily on Tues, Thurs, Sat, Sun 08/10/17  Yes Virginia Crews, MD  warfarin (COUMADIN) 7.5 MG tablet Take 8.5  mg PO daily on Mon, Wed, Fri, and 7.5 mg PO daily on Tues, Thurs, Sat, Sun 08/11/17  Yes Virginia Crews, MD    Review of Systems    She denies chest pain, palpitations, dyspnea, pnd, orthopnea, n, v, dizziness, syncope, edema, weight gain, or early satiety.  All other systems reviewed and are otherwise negative except as noted above.  Physical Exam    VS:  BP (!) 108/56 (BP Location: Left Arm, Patient Position: Sitting, Cuff Size: Normal)   Pulse 66   Ht 5' 2.5" (1.588 m)   Wt 214 lb 8 oz (97.3 kg)   LMP  (LMP Unknown)   BMI 38.61 kg/m  , BMI Body mass index is 38.61 kg/m. GEN: Well nourished, well developed, in no acute distress.  HEENT: normal.  Neck: Supple, no JVD, carotid bruits, or masses. Cardiac: RRR, mech S1, no murmurs, rubs, or gallops. No clubbing,  cyanosis, edema.  Radials/DP/PT 2+ and equal bilaterally.  Respiratory:  Respirations regular and unlabored, clear to auscultation bilaterally. GI: Soft, nontender, nondistended, BS + x 4. MS: no deformity or atrophy. Skin: warm and dry, no rash. Neuro:  Strength and sensation are intact. Psych: Normal affect.  Accessory Clinical Findings    ECG -regular sinus rhythm, 66, nonspecific T changes.  Assessment & Plan    1.  Coronary artery disease/preoperative cardiovascular examination: Patient is status post prior anterior myocardial infarction with known occlusion of the LAD in March 2016.  She does have LV dysfunction with an EF of 20-25% by echo in March 2017.  From a cardiac standpoint, she has done reasonably well.  She is exercising 3 days a week and is tolerating that well.  With her cardiac rate history, she carries a 6.6% risk of major cardiac event in the setting of non-emergent, noncardiac surgery.  Her Duke activity score is 42.7 with a maximum achievable METS of 7.99.  Given achievable METS between 4 and 10, she may proceed to surgery without additional ischemic evaluation.  She should remain on aspirin,  statin, and beta-blocker therapy throughout the perioperative period.  She is on chronic Coumadin in the setting of mechanical mitral valve and this will need to be bridged into and out of surgery.  This is followed by the community health clinic.  2.  Mechanical mitral valve: On chronic Coumadin.  Stable valve fxn by echo in 2017.  As above, this will require bridging with Lovenox into and out of pending surgery.  Will also require prophylactic abx for dental procedures.  3.  Ischemic cardiomyopathy/chronic combined systolic and diastolic congestive heart failure: Euvolemic on exam.  Weights have been stable at home.  Heart rate and blood pressure stable.  Continue beta-blocker, Entresto, and Spironolactone.  4.  Hyperlipidemia: She remains on high potency statin therapy with an LDL of 66 in November 2018.  5.  Essential hypertension: Stable.  Next  6.  Disposition: Follow-up with Dr. Saunders Revel in 3 months or sooner if necessary.  Murray Hodgkins, NP 09/13/2017, 1:06 PM

## 2017-09-14 ENCOUNTER — Ambulatory Visit (INDEPENDENT_AMBULATORY_CARE_PROVIDER_SITE_OTHER): Payer: Medicare Other

## 2017-09-14 DIAGNOSIS — I480 Paroxysmal atrial fibrillation: Secondary | ICD-10-CM | POA: Diagnosis not present

## 2017-09-14 LAB — POCT INR
INR: 3.5
PT: 42.3

## 2017-09-14 NOTE — Patient Instructions (Signed)
Description   No changes.Continue with same dose.  10 mg T/T/Sun and 8.5mg  daily on other days. Recheck in 2 weeks. STOP Lovenox per Dr.Bacigalupo.

## 2017-09-15 ENCOUNTER — Ambulatory Visit (INDEPENDENT_AMBULATORY_CARE_PROVIDER_SITE_OTHER): Payer: Medicare Other | Admitting: *Deleted

## 2017-09-15 ENCOUNTER — Other Ambulatory Visit: Payer: Self-pay | Admitting: General Surgery

## 2017-09-15 DIAGNOSIS — I5022 Chronic systolic (congestive) heart failure: Secondary | ICD-10-CM

## 2017-09-15 DIAGNOSIS — Z95810 Presence of automatic (implantable) cardiac defibrillator: Secondary | ICD-10-CM | POA: Diagnosis not present

## 2017-09-15 DIAGNOSIS — I255 Ischemic cardiomyopathy: Secondary | ICD-10-CM | POA: Diagnosis not present

## 2017-09-15 NOTE — Progress Notes (Signed)
Remote ICD transmission.   

## 2017-09-16 ENCOUNTER — Encounter: Payer: Self-pay | Admitting: Cardiology

## 2017-09-16 ENCOUNTER — Telehealth: Payer: Self-pay

## 2017-09-16 NOTE — Telephone Encounter (Signed)
Remote ICM transmission received.  Attempted call to patient and left detailed message per DPR regarding transmission and next ICM scheduled for 10/17/2017.  Advised to return call for any fluid symptoms or questions.

## 2017-09-16 NOTE — Progress Notes (Signed)
EPIC Encounter for ICM Monitoring  Patient Name: Vanessa Carlson is a 57 y.o. female Date: 09/16/2017 Primary Care Physican: Bacigalupo, Angela M, MD Primary Cardiologist: End/Tina Hackney, NP HF Electrophysiologist: Klein Dry Weight:Previous weight 213 lbs      Attempted call to patient and unable to reach.  Left detailed message regarding transmission.  Transmission reviewed.    Thoracic impedance normal.  Prescribed dosage: Furosemide 40 mg 1 tablet 3 times daily.   Labs: 08/31/2017 Creatinine 1.06, BUN 14, Potassium 3.9, Sodium 139, EGFR 57->60 07/08/2017 Creatinine 1.14, BUN 14, Potassium 4.2, Sodium 140, EGFR 53-62 03/23/2017 Creatinine 0.87, BUN 9, Potassium 3.7, Sodium 138, EGFR >60 02/23/2017 Creatinine 1.04, BUN 13, Potassium 3.8, Sodium 135, EGFR 58->60  02/08/2017 Creatinine 0.87, BUN 8, Potassium 3.2, Sodium 139, EGFR >60  01/28/2017 Creatinine 0.89, BUN 10, Potassium 3.6, Sodium 139, EGFR >60 12/31/2016 Creatinine 0.88, BUN 11, Potassium 3.2, Sodium 142, EGFR >60  Recommendations: Left voice mail with ICM number and encouraged to call if experiencing any fluid symptoms.  Follow-up plan: ICM clinic phone appointment on 10/17/2017.    Copy of ICM check sent to Dr. Klein.   3 month ICM trend: 09/15/2017    1 Year ICM trend:        S , RN 09/16/2017 11:31 AM   

## 2017-09-19 ENCOUNTER — Telehealth: Payer: Self-pay

## 2017-09-19 ENCOUNTER — Telehealth: Payer: Self-pay | Admitting: Family Medicine

## 2017-09-19 MED ORDER — ENOXAPARIN SODIUM 100 MG/ML ~~LOC~~ SOLN: 100.0000 mg | Freq: Two times a day (BID) | SUBCUTANEOUS | 0 refills | Status: DC

## 2017-09-19 NOTE — Telephone Encounter (Signed)
OK to hold coumadin with Lovenox bridge.  If she really needs [place] to inject the lovenox, will need to bring in lovenox injections and have appts scheduled daily through procedure and until coumadin is back to therapeutic levels.  It will be dosed BID, though so she needs to find someone to inject her at home.  We could teach her with first dose.  Patient has been prescribed lovenox before and was supposed to be injecting it then.  Will send Rx to pharmacy.   Will also need INR checks starting 1 week after procedure.  Can resume coumadin at current dose day after procedure or as directed by Pulmonology  Brita Romp, Dionne Bucy, MD, MPH [facility] 09/19/2017 4:25 PM

## 2017-09-19 NOTE — Telephone Encounter (Signed)
This form was believed to be handed back to patient at [place]. There is no copy on CMA's or PCP's desk, and there is no copy scanned into chart. Left message asking pt to have [facility] fax [place] another copy to be filled out.

## 2017-09-19 NOTE — Telephone Encounter (Signed)
Please review

## 2017-09-19 NOTE — Telephone Encounter (Signed)
Call made to patient at this time and I advised her that due to her CT Guided Lung Biopsy being rescheduled to 1/25 that we will reschedule her office visit to review Biopsy results to 1/30 at 8:15 with Dr. Genevive Bi. Patient verbalized understanding at this time.

## 2017-09-19 NOTE — Telephone Encounter (Signed)
Call made to patient at this time and I advised her that I have sent over anti-coagulant clearance to Dr. Brita Romp and I'm awaiting the "okay" for her to stop the coumadin and start the lovenox. I advised her that either I or a nurse from Dr. Brita Romp will contact her by end of day to give her instructions on how to proceed. She verbalized understanding.

## 2017-09-19 NOTE — Telephone Encounter (Signed)
Pt stated that she was supposed to have a lung biopsy sone Wednesday 09/21/17 but it has been delay to Friday 09/23/17. Pt stated Dr. Genevive Bi office that is doing the biopsy suggested she go ahead and start taking the enoxaparin (LOVENOX) 100 MG/ML injection but pt is asking if Dr. B agrees with her starting that medication. If so pt wants to know if she can come into the office to get this injection. Pt stated she has the medication but she can not give herself the injection. Please advise. Thanks TNP

## 2017-09-19 NOTE — Telephone Encounter (Signed)
[facility] calling to check status of sx clearance s/p appt with berge on 1/15    They also want patient to hold coumadin   Please call office or fax clearance for CT guided biopsy

## 2017-09-19 NOTE — Telephone Encounter (Signed)
Updated anti-coagulant has been faxed to Dr. Brita Romp at [place]. Copy of Cardiac Clearance faxed to [facility] at [place] scheduling.

## 2017-09-19 NOTE — Telephone Encounter (Signed)
Patient called stating she did not receive the [place] form that was suppose to be w her after visit summary she picked up.  Pt thinks it may have been left at our office. She request a call back

## 2017-09-19 NOTE — Telephone Encounter (Signed)
Clearance addressed in Emeline Gins 09/16/17 office note:   "Given achievable METS between 4 and 10, she may proceed to surgery without additional ischemic evaluation.  She should remain on aspirin, statin, and beta-blocker therapy throughout the perioperative period.  She is on chronic Coumadin in the setting of mechanical mitral valve and this will need to be bridged into and out of surgery.  This is followed by the community health clinic."  See full note for further details. The patient's coumadin will need to be addressed by the community health clinic for bridging.

## 2017-09-19 NOTE — Telephone Encounter (Signed)
[name] from scheduling called and stated that she needed the cardiac clearance as well as the anticoagulant clearance in order for patient to have her CT Guided Biopsy on Wednesday. I informed her that I have not yet received her clearance and will contact the office about it. She verbalized understanding.   Call made to Dr. Darnelle Bos office at this time regarding the Cardiac and Anticoagulant clearance. I spoke with [name] and she advised that she did not see in the not that the patient was released for surgery but that she would have one of the nurses contact me or fax over clearance approval at this time.

## 2017-09-19 NOTE — Telephone Encounter (Signed)
Advised pt as below. States she has not been giving herself the previously prescribed Lovenox injections. Pt declines coming to office for injection education. States her husband can teach her how to inject this medication. Also encouraged pt to ask pharmacist for instructions when she picks up rx. Changed coumadin clinic from Wednesday, 09/28/2017 to Friday, 09/30/2017.

## 2017-09-20 ENCOUNTER — Other Ambulatory Visit: Payer: Self-pay | Admitting: Student

## 2017-09-21 ENCOUNTER — Other Ambulatory Visit: Payer: Self-pay | Admitting: Radiology

## 2017-09-21 ENCOUNTER — Ambulatory Visit: Admission: RE | Admit: 2017-09-21 | Payer: Medicare Other | Source: Ambulatory Visit

## 2017-09-21 ENCOUNTER — Ambulatory Visit: Payer: Medicare Other

## 2017-09-21 ENCOUNTER — Telehealth: Payer: Self-pay | Admitting: Family Medicine

## 2017-09-21 NOTE — Telephone Encounter (Signed)
Forms completed.  Returned to [place].  Will need to copy.  2 pages remain for patient to fill out.  Virginia Crews, MD, MPH [facility] 09/21/2017 4:47 PM

## 2017-09-21 NOTE — Telephone Encounter (Signed)
This was for her heart attack. She has been out of since March, 2016 and is working part time now.

## 2017-09-21 NOTE — Telephone Encounter (Signed)
[employer], [employer] Forms. The forms have been placed in Dr. Nancy Nordmann box. Thanks TNP

## 2017-09-21 NOTE — Telephone Encounter (Signed)
These have been placed on Dr. Sharmaine Base desk for completion.

## 2017-09-21 NOTE — Telephone Encounter (Signed)
Forms placed up front for pt to finish. Note left on forms asking to copy document when it is completed to be scanned into chart.

## 2017-09-21 NOTE — Telephone Encounter (Signed)
As it has been a few weeks since I filled this out with patient in front of me to discuss what she is wanting disability for, I will need to have the patient refresh [place] on that.  Can you call and see what the reason for missed work and disability claim is for exactly?  Is it for new lung mass or heart attack or something else. Will also need to know when she was out of work and for how long and if she is currently working.  Thanks!  Virginia Crews, MD, MPH [facility] 09/21/2017 4:02 PM

## 2017-09-22 ENCOUNTER — Other Ambulatory Visit: Payer: Self-pay | Admitting: Radiology

## 2017-09-22 ENCOUNTER — Telehealth: Payer: Self-pay

## 2017-09-22 NOTE — Telephone Encounter (Signed)
Anti-coagulant clearance obtain and approved at this time. Clearance has been scanned into patient's chart and can be found under Media Tab.

## 2017-09-23 ENCOUNTER — Ambulatory Visit: Payer: Self-pay | Admitting: Cardiothoracic Surgery

## 2017-09-23 ENCOUNTER — Ambulatory Visit: Payer: Medicare Other

## 2017-09-23 ENCOUNTER — Ambulatory Visit
Admission: RE | Admit: 2017-09-23 | Discharge: 2017-09-23 | Disposition: A | Discharge status: Home / Self Care | Payer: Medicare Other | Source: Ambulatory Visit | Attending: Cardiothoracic Surgery | Admitting: Cardiothoracic Surgery

## 2017-09-23 ENCOUNTER — Ambulatory Visit
Admission: RE | Admit: 2017-09-23 | Discharge: 2017-09-23 | Disposition: A | Discharge status: Home / Self Care | Payer: Medicare Other | Source: Ambulatory Visit | Attending: Interventional Radiology | Admitting: Interventional Radiology

## 2017-09-23 DIAGNOSIS — I255 Ischemic cardiomyopathy: Secondary | ICD-10-CM | POA: Diagnosis not present

## 2017-09-23 DIAGNOSIS — I110 Hypertensive heart disease with heart failure: Secondary | ICD-10-CM | POA: Insufficient documentation

## 2017-09-23 DIAGNOSIS — Z952 Presence of prosthetic heart valve: Secondary | ICD-10-CM | POA: Insufficient documentation

## 2017-09-23 DIAGNOSIS — Z90710 Acquired absence of both cervix and uterus: Secondary | ICD-10-CM | POA: Insufficient documentation

## 2017-09-23 DIAGNOSIS — R918 Other nonspecific abnormal finding of lung field: Secondary | ICD-10-CM

## 2017-09-23 DIAGNOSIS — Z7901 Long term (current) use of anticoagulants: Secondary | ICD-10-CM | POA: Diagnosis not present

## 2017-09-23 DIAGNOSIS — J95811 Postprocedural pneumothorax: Secondary | ICD-10-CM

## 2017-09-23 DIAGNOSIS — Z87891 Personal history of nicotine dependence: Secondary | ICD-10-CM | POA: Insufficient documentation

## 2017-09-23 DIAGNOSIS — C3411 Malignant neoplasm of upper lobe, right bronchus or lung: Secondary | ICD-10-CM | POA: Diagnosis not present

## 2017-09-23 DIAGNOSIS — Z95810 Presence of automatic (implantable) cardiac defibrillator: Secondary | ICD-10-CM | POA: Insufficient documentation

## 2017-09-23 DIAGNOSIS — I252 Old myocardial infarction: Secondary | ICD-10-CM | POA: Insufficient documentation

## 2017-09-23 DIAGNOSIS — R911 Solitary pulmonary nodule: Secondary | ICD-10-CM | POA: Diagnosis not present

## 2017-09-23 DIAGNOSIS — I5042 Chronic combined systolic (congestive) and diastolic (congestive) heart failure: Secondary | ICD-10-CM | POA: Diagnosis not present

## 2017-09-23 HISTORY — DX: Presence of automatic (implantable) cardiac defibrillator: Z95.810

## 2017-09-23 LAB — CBC
HCT: 41.4 % (ref 35.0–47.0)
HEMOGLOBIN: 13.6 g/dL (ref 12.0–16.0)
MCH: 28 pg (ref 26.0–34.0)
MCHC: 32.9 g/dL (ref 32.0–36.0)
MCV: 85.2 fL (ref 80.0–100.0)
Platelets: 167 10*3/uL (ref 150–440)
RBC: 4.86 MIL/uL (ref 3.80–5.20)
RDW: 15.6 % — ABNORMAL HIGH (ref 11.5–14.5)
WBC: 6.7 10*3/uL (ref 3.6–11.0)

## 2017-09-23 LAB — PROTIME-INR
INR: 1.1
PROTHROMBIN TIME: 14.1 s (ref 11.4–15.2)

## 2017-09-23 MED ORDER — MIDAZOLAM HCL 5 MG/5ML IJ SOLN
INTRAMUSCULAR | Status: AC
  Filled 2017-09-23: qty 5

## 2017-09-23 MED ORDER — SODIUM CHLORIDE 0.9 % IV SOLN
INTRAVENOUS | Status: DC
  Administered 2017-09-23: 11:00:00 via INTRAVENOUS

## 2017-09-23 MED ORDER — MIDAZOLAM HCL 5 MG/5ML IJ SOLN
INTRAMUSCULAR | Status: AC | PRN
  Administered 2017-09-23: 0.5 mg via INTRAVENOUS
  Administered 2017-09-23 (×2): 1 mg via INTRAVENOUS

## 2017-09-23 MED ORDER — FENTANYL CITRATE (PF) 100 MCG/2ML IJ SOLN
INTRAMUSCULAR | Status: AC | PRN
  Administered 2017-09-23: 50 ug via INTRAVENOUS
  Administered 2017-09-23: 25 ug via INTRAVENOUS

## 2017-09-23 MED ORDER — LIDOCAINE HCL (PF) 1 % IJ SOLN
INTRAMUSCULAR | Status: AC | PRN
  Administered 2017-09-23: 8 mL

## 2017-09-23 MED ORDER — FENTANYL CITRATE (PF) 100 MCG/2ML IJ SOLN
INTRAMUSCULAR | Status: AC
  Filled 2017-09-23: qty 4

## 2017-09-23 NOTE — Procedures (Signed)
Interventional Radiology Procedure Note  Procedure: CT guided biopsy of right lung nodule Complications: None Recommendations: - Bedrest until CXR cleared.  Minimize talking, coughing or otherwise straining.  - Follow up 1 hr CXR pending  - NPO until CXR cleared  Signed,  Corrie Mckusick, DO

## 2017-09-23 NOTE — H&P (Signed)
Chief Complaint: RUL lung mass  Referring Physician:Dr. Nestor Lewandowsky  Supervising Physician: Corrie Mckusick  Patient Status: [facility] - Out-pt  HPI: Vanessa Carlson is a 57 y.o. female who has a history of valvular disease, s/p valve replacement on coumadin who was having some left sided chest pain about 2 weeks ago and went to her cardiologist.  Her EKG was abnormal and referred to the ED.  However, upon arrival to the ED she was found to have a normal EKG.  She then had a CT of the chest which incidentally found this RUL lung mass.  She was referred today for a biopsy of this mass.  She has been bridged on her coumadin with lovenox shots.  Her last lovenox was yesterday at 1700.  Past Medical History:  Past Medical History:  Diagnosis Date  . AICD (automatic cardioverter/defibrillator) present   . Anterior myocardial infarction [facility])    a. 10/2014 - occluded LAD, complicated by cardiogenic shock-->Med Rx as interventional team was unable to open LAD.  [name] Cervical cancer ([place])    in remission for ~20 yrs, s/p hysterectomy  . Chronic combined systolic and diastolic CHF (congestive heart failure) ([place])    a. 10/2015 Echo: EF 20-25%, sev diast dysfxn, mildly reduced RV fxn. Nl MV prosthesis.  . Coronary artery disease    a. 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  . Essential hypertension   . Ischemic cardiomyopathy    a. 03/2015 s/p MDT single lead AICD;  b. 10/2015 Echo: EF 20-25%.  . Mass of upper lobe of right lung    a. noted on CXR & CT 08/2017 w/ abnl PET CT.  [name] Mitral valve disease    a. 2006 s/p MVR w/ 63mm SJM bileaflet mechanical valve-->chronic coumadin/ASA.    Past Surgical History:  Past Surgical History:  Procedure Laterality Date  . APPENDECTOMY  1998  . CARDIAC CATHETERIZATION    . CARDIAC DEFIBRILLATOR PLACEMENT    . RADICAL HYSTERECTOMY  1998  . SHOULDER SURGERY Bilateral    rotator cuff tears  . VALVE REPLACEMENT     Mitral valve; 27 mm St. Jude bileaflet valve    Family History:  Family History  Problem Relation Age of Onset  . Heart attack Mother   . Hypertension Mother   . Diabetes Mother   . Emphysema Father        smoker  . Diabetes Maternal Grandmother   . Kidney disease Maternal Grandmother   . Breast cancer Maternal Aunt   . Breast cancer Maternal Aunt   . Colon cancer Maternal Uncle     Social History:  reports that she quit smoking about 21 years ago. Her smoking use included cigarettes. She has a 4.00 pack-year smoking history. she has never used smokeless tobacco. She reports that she does not drink alcohol or use drugs.  Allergies: No Known Allergies  Medications: Medications reviewed in epic  Please HPI for pertinent positives, otherwise complete 10 system ROS negative.  Mallampati Score: MD Evaluation Airway: WNL Heart: WNL Abdomen: WNL Chest/ Lungs: WNL ASA  Classification: 3 Mallampati/Airway Score: Two  Physical Exam: BP 99/84   Pulse 85   Temp 98.4 F (36.9 C) (Oral)   Resp 20   LMP  (LMP Unknown)   SpO2 99%  There is no height or weight on file to calculate BMI. General: pleasant, WD, WN black female who is laying in bed  in NAD HEENT: head is normocephalic, atraumatic.  Sclera are noninjected.  PERRL.  Ears and nose without any masses or lesions.  Mouth is pink and moist Heart: regular, rate, and rhythm.  Normal s1,s2. No obvious murmurs, gallops, or rubs noted.  Palpable radial pulses bilaterally Lungs: CTAB, no wheezes, rhonchi, or rales noted.  Respiratory effort nonlabored Abd: soft, NT, ND, +BS, no masses, hernias, or organomegaly Psych: A&Ox3 with an appropriate affect.   Labs: Results for orders placed or performed during the hospital encounter of 09/23/17 (from the past 48 hour(s))  CBC upon arrival     Status: Abnormal   Collection Time: 09/23/17  9:43 AM  Result Value Ref Range   WBC 6.7 3.6 - 11.0  K/uL   RBC 4.86 3.80 - 5.20 MIL/uL   Hemoglobin 13.6 12.0 - 16.0 g/dL   HCT 41.4 35.0 - 47.0 %   MCV 85.2 80.0 - 100.0 fL   MCH 28.0 26.0 - 34.0 pg   MCHC 32.9 32.0 - 36.0 g/dL   RDW 15.6 (H) 11.5 - 14.5 %   Platelets 167 150 - 440 K/uL    Comment: Performed at [facility], [address], [place], [place] [address]    Imaging: No results found.  Assessment/Plan 1. RUL pulmonary lesion  Plan to proceed today with a RUL lung mass biopsy.  Labs and vitals have been reviewed.  Her INR is 1.10.  She has been bridged on lovenox. Risks and benefits discussed with the patient including, but not limited to bleeding, hemoptysis, respiratory failure requiring intubation, infection, pneumothorax requiring chest tube placement, stroke from air embolism or even death. All of the patient's questions were answered, patient is agreeable to proceed. Consent signed and in chart.  Thank you for this interesting consult.  I greatly enjoyed meeting Vanessa Carlson and look forward to participating in their care.  A copy of this report was sent to the requesting provider on this date.  Electronically Signed: Henreitta Cea 09/23/2017, 10:40 AM   I spent a total of  30 Minutes   in face to face in clinical consultation, greater than 50% of which was counseling/coordinating care for RUL lung mass

## 2017-09-26 ENCOUNTER — Other Ambulatory Visit: Payer: Self-pay | Admitting: Pathology

## 2017-09-26 LAB — SURGICAL PATHOLOGY

## 2017-09-28 ENCOUNTER — Ambulatory Visit: Payer: Self-pay

## 2017-09-28 ENCOUNTER — Ambulatory Visit: Payer: Self-pay | Admitting: Cardiothoracic Surgery

## 2017-09-30 ENCOUNTER — Ambulatory Visit (INDEPENDENT_AMBULATORY_CARE_PROVIDER_SITE_OTHER): Payer: Medicare Other

## 2017-09-30 ENCOUNTER — Encounter: Payer: Self-pay | Admitting: Cardiothoracic Surgery

## 2017-09-30 ENCOUNTER — Ambulatory Visit (INDEPENDENT_AMBULATORY_CARE_PROVIDER_SITE_OTHER): Payer: Medicare Other | Admitting: Cardiothoracic Surgery

## 2017-09-30 VITALS — BP 104/68 | HR 82 | Temp 97.8°F | Resp 16 | Ht 62.0 in | Wt 216.0 lb

## 2017-09-30 DIAGNOSIS — I480 Paroxysmal atrial fibrillation: Secondary | ICD-10-CM | POA: Diagnosis not present

## 2017-09-30 DIAGNOSIS — I255 Ischemic cardiomyopathy: Secondary | ICD-10-CM | POA: Diagnosis not present

## 2017-09-30 DIAGNOSIS — C3411 Malignant neoplasm of upper lobe, right bronchus or lung: Secondary | ICD-10-CM

## 2017-09-30 DIAGNOSIS — R918 Other nonspecific abnormal finding of lung field: Secondary | ICD-10-CM | POA: Diagnosis not present

## 2017-09-30 HISTORY — DX: Malignant neoplasm of upper lobe, right bronchus or lung: C34.11

## 2017-09-30 LAB — POCT INR
INR: 1.1
PT: 13.6

## 2017-09-30 NOTE — Progress Notes (Signed)
  Patient ID: Vanessa Carlson, female   DOB: [birth year], 57 y.o.   MRN: [id number]  HISTORY: She returns today in follow-up.  She did undergo a right upper lobe lung biopsy revealing an adenocarcinoma.  She states that that went well.  She has occasional episodes where she feels more short of breath than others and she believes that this is mostly related to fluid retention.  Otherwise she seems to be getting along okay.  She did see her cardiologist and she was going any further testing.   Vitals:   09/30/17 0826  BP: 104/68  Pulse: 82  Resp: 16  Temp: 97.8 F (36.6 C)  SpO2: 98%     EXAM:    Resp: Lungs are clear bilaterally.  No respiratory distress, normal effort. Heart:  Regular without murmurs Abd:  Abdomen is soft, non distended and non tender. No masses are palpable.  There is no rebound and no guarding.  Neurological: Alert and oriented to person, place, and time. Coordination normal.  Skin: Skin is warm and dry. No rash noted. No diaphoretic. No erythema. No pallor.  Psychiatric: Normal mood and affect. Normal behavior. Judgment and thought content normal.  The skin biopsy site is clean and dry.  There is some bruising around it but nothing to be concerned about.   ASSESSMENT: Right upper lobe mass with biopsy-proven adenocarcinoma.   PLAN:   I had a long discussion with her regarding the options.  She has what appears to be in stage I carcinoma of the lung.  I explained her the options of surgery versus nonoperative management.  I reviewed with her the risks of surgery including risks related to her anticoagulation and her cardiac disease.  Risks of bleeding, infection, air leak, death were all reviewed.  We also discussed the cardiac complications related to her valve and her anticoagulation.  I also explained to her that overall surgical intervention is associated with better long-term results but are associated with slightly higher treatment complications.  She  understands and would like to discuss her options with our radiation therapist.  I will go ahead and make an appointment to see Dr. Berton Mount.  I gave her my business card and told her to contact us when she sees Dr. Donella Stade so that we can make definitive plans for her treatment.    Nestor Lewandowsky, MD

## 2017-09-30 NOTE — Patient Instructions (Addendum)
Description   Dx: Paroxysmal Atrial Fibrillation Current dose: On Lovenox injections 100mg /ml every 12 hours for 14 days. PT: 13.6 INR: 1.1 Today's Changes:  Continue Lovenox injections and restart coumadin at 8.5mg  daily, except 10mg  on Tuesday, Thursday and Sunday Recheck: 1 week

## 2017-09-30 NOTE — Patient Instructions (Addendum)
We have made you an appointment to be seen by Dr. Donella Stade which is on February 6th at [place].  After speaking with him please give our office a call to know what you have decided.  If you have any questions or concerns please give our office a call.

## 2017-10-03 LAB — CUP PACEART REMOTE DEVICE CHECK
Battery Remaining Longevity: 122 mo
Battery Voltage: 3.02 V
Brady Statistic RV Percent Paced: 0.01 %
Date Time Interrogation Session: 20190117083423
HIGH POWER IMPEDANCE MEASURED VALUE: 81 Ohm
Implantable Pulse Generator Implant Date: 20160802
Lead Channel Impedance Value: 285 Ohm
Lead Channel Impedance Value: 380 Ohm
Lead Channel Sensing Intrinsic Amplitude: 10.75 mV
Lead Channel Setting Pacing Amplitude: 2 V
Lead Channel Setting Pacing Pulse Width: 0.4 ms
MDC IDC LEAD IMPLANT DT: 20160802
MDC IDC LEAD LOCATION: 753860
MDC IDC MSMT LEADCHNL RV PACING THRESHOLD AMPLITUDE: 0.625 V
MDC IDC MSMT LEADCHNL RV PACING THRESHOLD PULSEWIDTH: 0.4 ms
MDC IDC MSMT LEADCHNL RV SENSING INTR AMPL: 10.75 mV
MDC IDC SET LEADCHNL RV SENSING SENSITIVITY: 0.3 mV

## 2017-10-05 ENCOUNTER — Other Ambulatory Visit: Payer: Self-pay

## 2017-10-05 ENCOUNTER — Encounter: Payer: Self-pay | Admitting: Radiation Oncology

## 2017-10-05 ENCOUNTER — Ambulatory Visit
Admission: RE | Admit: 2017-10-05 | Discharge: 2017-10-05 | Disposition: A | Discharge status: Home / Self Care | Payer: Medicare Other | Source: Ambulatory Visit | Attending: Radiation Oncology | Admitting: Radiation Oncology

## 2017-10-05 ENCOUNTER — Encounter: Payer: Self-pay | Admitting: *Deleted

## 2017-10-05 VITALS — BP 115/75 | HR 86 | Temp 98.3°F | Resp 20 | Wt 217.5 lb

## 2017-10-05 DIAGNOSIS — I5042 Chronic combined systolic (congestive) and diastolic (congestive) heart failure: Secondary | ICD-10-CM | POA: Diagnosis not present

## 2017-10-05 DIAGNOSIS — I341 Nonrheumatic mitral (valve) prolapse: Secondary | ICD-10-CM | POA: Diagnosis not present

## 2017-10-05 DIAGNOSIS — C3411 Malignant neoplasm of upper lobe, right bronchus or lung: Secondary | ICD-10-CM | POA: Insufficient documentation

## 2017-10-05 DIAGNOSIS — Z87891 Personal history of nicotine dependence: Secondary | ICD-10-CM | POA: Insufficient documentation

## 2017-10-05 DIAGNOSIS — Z800 Family history of malignant neoplasm of digestive organs: Secondary | ICD-10-CM | POA: Diagnosis not present

## 2017-10-05 DIAGNOSIS — Z8541 Personal history of malignant neoplasm of cervix uteri: Secondary | ICD-10-CM | POA: Diagnosis not present

## 2017-10-05 DIAGNOSIS — Z79899 Other long term (current) drug therapy: Secondary | ICD-10-CM | POA: Insufficient documentation

## 2017-10-05 DIAGNOSIS — Z9049 Acquired absence of other specified parts of digestive tract: Secondary | ICD-10-CM | POA: Diagnosis not present

## 2017-10-05 DIAGNOSIS — Z7982 Long term (current) use of aspirin: Secondary | ICD-10-CM | POA: Diagnosis not present

## 2017-10-05 DIAGNOSIS — Z90710 Acquired absence of both cervix and uterus: Secondary | ICD-10-CM | POA: Insufficient documentation

## 2017-10-05 DIAGNOSIS — I509 Heart failure, unspecified: Secondary | ICD-10-CM | POA: Diagnosis not present

## 2017-10-05 DIAGNOSIS — I10 Essential (primary) hypertension: Secondary | ICD-10-CM | POA: Diagnosis not present

## 2017-10-05 DIAGNOSIS — Z7901 Long term (current) use of anticoagulants: Secondary | ICD-10-CM | POA: Insufficient documentation

## 2017-10-05 DIAGNOSIS — I252 Old myocardial infarction: Secondary | ICD-10-CM | POA: Insufficient documentation

## 2017-10-05 DIAGNOSIS — Z803 Family history of malignant neoplasm of breast: Secondary | ICD-10-CM | POA: Insufficient documentation

## 2017-10-05 DIAGNOSIS — I2510 Atherosclerotic heart disease of native coronary artery without angina pectoris: Secondary | ICD-10-CM | POA: Insufficient documentation

## 2017-10-05 NOTE — Progress Notes (Signed)
  Oncology Nurse Navigator Documentation  Navigator Location: [facility] (10/05/17 1500) Referral date to RadOnc/MedOnc: 09/30/17 (10/05/17 1500) )Navigator Encounter Type: Initial RadOnc (10/05/17 1500)   Abnormal Finding Date: 08/31/17 (10/05/17 1500) Confirmed Diagnosis Date: 09/26/17 (10/05/17 1500)                 Treatment Phase: Pre-Tx/Tx Discussion (10/05/17 1500) Barriers/Navigation Needs: Education;Coordination of Care (10/05/17 1500) Education: Understanding Cancer/ Treatment Options (10/05/17 1500) Interventions: Coordination of Care;Education (10/05/17 1500)   Coordination of Care: Appts (10/05/17 1500) Education Method: Verbal (10/05/17 1500)      Acuity: Level 1 (10/05/17 1500) Acuity Level 1: Initial guidance, education and coordination as needed;Minimal follow up required (10/05/17 1500)  met with patient during initial radiation oncology consultation with Dr. Baruch Gouty. All questions answered at the time of visit. Reviewed upcoming appts with patient. Pt given contact info and instructed to call with any further questions or needs. Pt and family verbalized understanding.     Time Spent with Patient: 60 (10/05/17 1500)

## 2017-10-05 NOTE — Consult Note (Signed)
NEW PATIENT EVALUATION  Name: Vanessa Carlson  MRN: [id number]  Date:   10/05/2017     DOB: [birth year]   This 57 y.o. female patient presents to the clinic for initial evaluation of adenocarcinoma the right upper lobe stage I.  REFERRING PHYSICIAN: Bacigalupo, Dionne Bucy, MD  CHIEF COMPLAINT:  Chief Complaint  Patient presents with  . Lung Cancer    Pt is here for initial consultation of lung cancer    DIAGNOSIS: The encounter diagnosis was Cancer of upper lobe of right lung ([place]).   PREVIOUS INVESTIGATIONS:  PET CT scan and CT scans reviewed Pathology reports reviewed Clinical notes reviewed  HPI: Patient is a 57 year old female who presented back in early January with some atypical chest pain. She has an extensive cardiac history including mitral valve replacement 15 years prior and then a MI 3 years ago. Workup in the ER showed a right upper lobe lung nodule PET CT scan demonstrated a 2.5 cm spiculated right upper lobe nodule consistent with primary bronchogenic carcinoma. No evidence of thoracic lymph node or distant metastatic disease was noted. She underwent a CT-guided biopsy which was positive for adenocarcinoma with acinar and solid morphologies. Her most recent cardiac workup showed an ejection fracture of 20-25%. She also has a history of cervical cancer and status post hysterectomy. She's currently on Coumadin and aspirin. Pulley function tests have revealed an FEV1 of between 70 and 75%. She's been seen by thoracic oncology and risks and benefits of surgical resection were reviewed. She is now referred to radiation oncology for opinion. She is doing well and is fairly asymptomatic at this time except for some trace hemoptysis related to her recent CT biopsy.  PLANNED TREATMENT REGIMEN: SB RT  PAST MEDICAL HISTORY:  has a past medical history of AICD (automatic cardioverter/defibrillator) present, Anterior myocardial infarction [facility]), Cervical cancer ([place]), Chronic combined  systolic and diastolic CHF (congestive heart failure) ([place]), Coronary artery disease, Essential hypertension, Ischemic cardiomyopathy, Mass of upper lobe of right lung, and Mitral valve disease.    PAST SURGICAL HISTORY:  Past Surgical History:  Procedure Laterality Date  . APPENDECTOMY  1998  . CARDIAC CATHETERIZATION    . CARDIAC DEFIBRILLATOR PLACEMENT    . RADICAL HYSTERECTOMY  1998  . SHOULDER SURGERY Bilateral    rotator cuff tears  . VALVE REPLACEMENT     Mitral valve; 27 mm St. Jude bileaflet valve    FAMILY HISTORY: family history includes Breast cancer in her maternal aunt and maternal aunt; Colon cancer in her maternal uncle; Diabetes in her maternal grandmother and mother; Emphysema in her father; Heart attack in her mother; Hypertension in her mother; Kidney disease in her maternal grandmother.  SOCIAL HISTORY:  reports that she quit smoking about 21 years ago. Her smoking use included cigarettes. She has a 4.00 Carlson-year smoking history. she has never used smokeless tobacco. She reports that she does not drink alcohol or use drugs.  ALLERGIES: Patient has no known allergies.  MEDICATIONS:  Current Outpatient Medications  Medication Sig Dispense Refill  . aspirin 81 MG chewable tablet Chew 81 mg by mouth daily.    [name] atorvastatin (LIPITOR) 80 MG tablet Take 80 mg by mouth daily.    . fluticasone (FLONASE) 50 MCG/ACT nasal spray Place 2 sprays into both nostrils as needed for allergies or rhinitis.    . furosemide (LASIX) 40 MG tablet TAKE 1 TABLET BY MOUTH THREE TIMES DAILY 270 tablet 3  . metoprolol succinate (TOPROL-XL) 50 MG 24 hr  tablet Take 1/2 tablet (25 mg) by mouth two times a day. Take with or immediately following a meal. 90 tablet 3  . nitroGLYCERIN (NITROSTAT) 0.3 MG SL tablet Place 1 tablet (0.3 mg total) under the tongue every 5 (five) minutes as needed for chest pain. 30 tablet 3  . oxyCODONE-acetaminophen (ROXICET) 5-325 MG tablet Take 1 tablet by mouth every  6 (six) hours as needed. 8 tablet 0  . sacubitril-valsartan (ENTRESTO) 24-26 MG Take 1 tablet by mouth 2 (two) times daily. 60 tablet 5  . spironolactone (ALDACTONE) 25 MG tablet Take 1 tablet (25 mg total) by mouth daily. 90 tablet 3  . traMADol (ULTRAM) 50 MG tablet Take 1 tablet (50 mg total) every 6 (six) hours as needed by mouth. 30 tablet 5  . warfarin (COUMADIN) 1 MG tablet Take 1 tablet (1 mg total) by mouth as directed. 30 tablet 2  . warfarin (COUMADIN) 2.5 MG tablet Take 8.5 mg PO daily on Mon, Wed, Fri, and 7.5 mg PO daily on Tues, Thurs, Sat, Sun 90 tablet 1  . warfarin (COUMADIN) 7.5 MG tablet Take 8.5 mg PO daily on Mon, Wed, Fri, and 7.5 mg PO daily on Tues, Thurs, Sat, Sun 90 tablet 1   No current facility-administered medications for this encounter.     ECOG PERFORMANCE STATUS:  0 - Asymptomatic  REVIEW OF SYSTEMS: Patient does get short of breath easily. Patient denies any weight loss, fatigue, weakness, fever, chills or night sweats. Patient denies any loss of vision, blurred vision. Patient denies any ringing  of the ears or hearing loss. No irregular heartbeat. Patient denies heart murmur or history of fainting. Patient denies any chest pain or pain radiating to her upper extremities. Patient denies any shortness of breath, difficulty breathing at night, cough or hemoptysis. Patient denies any swelling in the lower legs. Patient denies any nausea vomiting, vomiting of blood, or coffee ground material in the vomitus. Patient denies any stomach pain. Patient states has had normal bowel movements no significant constipation or diarrhea. Patient denies any dysuria, hematuria or significant nocturia. Patient denies any problems walking, swelling in the joints or loss of balance. Patient denies any skin changes, loss of hair or loss of weight. Patient denies any excessive worrying or anxiety or significant depression. Patient denies any problems with insomnia. Patient denies excessive  thirst, polyuria, polydipsia. Patient denies any swollen glands, patient denies easy bruising or easy bleeding. Patient denies any recent infections, allergies or URI. Patient "s visual fields have not changed significantly in recent time.    PHYSICAL EXAM: BP 115/75   Pulse 86   Temp 98.3 F (36.8 C)   Resp 20   Wt 217 lb 7.7 oz (98.6 kg)   LMP  (LMP Unknown)   BMI 39.78 kg/m  Well-developed slightly obese female in NAD.Well-developed well-nourished patient in NAD. HEENT reveals PERLA, EOMI, discs not visualized.  Oral cavity is clear. No oral mucosal lesions are identified. Neck is clear without evidence of cervical or supraclavicular adenopathy. Lungs are clear to A&P. Cardiac examination is essentially unremarkable with regular rate and rhythm without murmur rub or thrill. Abdomen is benign with no organomegaly or masses noted. Motor sensory and DTR levels are equal and symmetric in the upper and lower extremities. Cranial nerves II through XII are grossly intact. Proprioception is intact. No peripheral adenopathy or edema is identified. No motor or sensory levels are noted. Crude visual fields are within normal range.  LABORATORY DATA: Pathology reports reviewed  RADIOLOGY RESULTS: CT scans and PET/CT scan reviewed   IMPRESSION: Stage I (T1 b N0 M0) adenocarcinoma the right upper lobe in 57 year old female with multiple cardiac morbidities  PLAN: At this time of explained that risks and benefits of SB RT to the patient. I've explained to her we have approximate 90% cure rate using SB RT in the size lesion. Patient also could be candidate for salvage surgery should radiation fell. I would plan on delivering 6000 cGy in 5 fractions using SB RT. Risks and benefits of treatment including fatigue loss of normal lung volume possible dysphasia possible skin reaction all were described in detail to the patient and her family. Patient is considering SB RT and I have given her a appointment for  simulation for next week. I've also explained to the patient that surgical resection would be the #1 choice for treatment although with her extensive cardiac history SB RT is a highly viable option.  I would like to take this opportunity to thank you for allowing me to participate in the care of your patient.Noreene Filbert, MD

## 2017-10-07 ENCOUNTER — Ambulatory Visit: Payer: Self-pay

## 2017-10-12 ENCOUNTER — Telehealth: Payer: Self-pay | Admitting: Internal Medicine

## 2017-10-12 ENCOUNTER — Ambulatory Visit (INDEPENDENT_AMBULATORY_CARE_PROVIDER_SITE_OTHER): Payer: Medicare Other

## 2017-10-12 ENCOUNTER — Ambulatory Visit
Admission: RE | Admit: 2017-10-12 | Discharge: 2017-10-12 | Disposition: A | Discharge status: Home / Self Care | Payer: Medicare Other | Source: Ambulatory Visit | Attending: Radiation Oncology | Admitting: Radiation Oncology

## 2017-10-12 DIAGNOSIS — Z87891 Personal history of nicotine dependence: Secondary | ICD-10-CM | POA: Insufficient documentation

## 2017-10-12 DIAGNOSIS — C3411 Malignant neoplasm of upper lobe, right bronchus or lung: Secondary | ICD-10-CM | POA: Insufficient documentation

## 2017-10-12 DIAGNOSIS — Z952 Presence of prosthetic heart valve: Secondary | ICD-10-CM

## 2017-10-12 DIAGNOSIS — Z510 Encounter for antineoplastic radiation therapy: Secondary | ICD-10-CM | POA: Insufficient documentation

## 2017-10-12 LAB — POCT INR
INR: 2.5
PT: 30.5

## 2017-10-12 NOTE — Progress Notes (Signed)
Per Dr. Jacinto Reap, patient's diagnosis is Mechanical Heart Valve, not Afib. Diagnosis was updated in chart.

## 2017-10-12 NOTE — Telephone Encounter (Signed)
° °  [facility] Pre-operative Risk Assessment    Request for surgical clearance:  1. What type of surgery is being performed? Radiation   2. When is this surgery scheduled? 10/31/17    3. What type of clearance is required (medical clearance vs. Pharmacy clearance to hold med vs. Both)? Medical   4. Are there any medications that need to be held prior to surgery and how long?    5. Practice name and name of physician performing surgery? [facility]   6. What is your office phone and fax number? Fax [phone]  7. Anesthesia type (None, local, MAC, general) ? Not needed    Placed in nurses bin.

## 2017-10-12 NOTE — Telephone Encounter (Signed)
Routing to Dr End for clearance.

## 2017-10-12 NOTE — Patient Instructions (Signed)
Description   Dx: Mechanical Valve Current dose:8.5mg  daily, except 10mg  on Tuesday, Thursday and Sunday PT: 30.3 INR: 2.5 Today's Changes: 8.5mg  daily, except 10mg  on Tuesday, Thursday and Sunday Recheck: 2 weeks

## 2017-10-13 DIAGNOSIS — Z510 Encounter for antineoplastic radiation therapy: Secondary | ICD-10-CM | POA: Diagnosis not present

## 2017-10-13 DIAGNOSIS — C3411 Malignant neoplasm of upper lobe, right bronchus or lung: Secondary | ICD-10-CM | POA: Diagnosis not present

## 2017-10-13 DIAGNOSIS — Z87891 Personal history of nicotine dependence: Secondary | ICD-10-CM | POA: Diagnosis not present

## 2017-10-13 NOTE — Telephone Encounter (Signed)
Patient was doing well from a heart standpoint when seen by Ignacia Bayley, NP, last month. I think it is okay for her to proceed with radiation without additional cardiac testing/intervention. I recommend that her heart and ICD be excluded from the radiation field if possible. If there are specific concerns related to radiation and the patient's defibrillation, they will need to be addressed by Dr. Caryl Comes. I will cc him on this message.  [name], MD [facility] Pager: [phone]

## 2017-10-13 NOTE — Telephone Encounter (Signed)
Phone note and PPM/ ICD form to the [facility]- [phone]- confirmation received.

## 2017-10-13 NOTE — Telephone Encounter (Signed)
Dr. Caryl Comes completed the [place] PPM/ ICD form- ok from a device standpoint to proceed. Will fax a copy of this phone note with the PPM form to the [facility] at [phone].

## 2017-10-16 ENCOUNTER — Other Ambulatory Visit: Payer: Self-pay | Admitting: Family

## 2017-10-17 ENCOUNTER — Ambulatory Visit (INDEPENDENT_AMBULATORY_CARE_PROVIDER_SITE_OTHER): Payer: Medicare Other

## 2017-10-17 DIAGNOSIS — I5022 Chronic systolic (congestive) heart failure: Secondary | ICD-10-CM

## 2017-10-17 DIAGNOSIS — Z95810 Presence of automatic (implantable) cardiac defibrillator: Secondary | ICD-10-CM | POA: Diagnosis not present

## 2017-10-18 ENCOUNTER — Telehealth: Payer: Self-pay

## 2017-10-18 NOTE — Telephone Encounter (Signed)
Remote ICM transmission received.  Attempted call to patient and left detailed message per DPR regarding transmission and next ICM scheduled for 11/17/2017.  Advised to return call for any fluid symptoms or questions.

## 2017-10-18 NOTE — Progress Notes (Signed)
EPIC Encounter for ICM Monitoring  Patient Name: Vanessa Carlson is a 57 y.o. female Date: 10/18/2017 Primary Care Physican: Virginia Crews, MD Primary Cardiologist: Carlis Abbott, NP HF Electrophysiologist: Faustino Congress Weight:Previous [id number] lbs        Attempted call to patient and unable to reach.  Left detailed message regarding transmission.  Transmission reviewed.   Patient receiving radiation for lung cancer   Thoracic impedance normal.  Prescribed dosage: Furosemide 40 mg 1 tablet 3 times daily.   Labs: 08/31/2017 Creatinine 1.06, BUN 14, Potassium 3.9, Sodium 139, EGFR 57->60 07/08/2017 Creatinine 1.14, BUN 14, Potassium 4.2, Sodium 140, EGFR 53-62 03/23/2017 Creatinine 0.87, BUN 9, Potassium 3.7, Sodium 138, EGFR >60 02/23/2017 Creatinine 1.04, BUN 13, Potassium 3.8, Sodium 135, EGFR 58->60  02/08/2017 Creatinine 0.87, BUN 8, Potassium 3.2, Sodium 139, EGFR >60  01/28/2017 Creatinine 0.89, BUN 10, Potassium 3.6, Sodium 139, EGFR >60 12/31/2016 Creatinine 0.88, BUN 11, Potassium 3.2, Sodium 142, EGFR >60  Recommendations: Left voice mail with ICM number and encouraged to call if experiencing any fluid symptoms.  Follow-up plan: ICM clinic phone appointment on 11/17/2017.    Copy of ICM check sent to Dr. Caryl Comes.   3 month ICM trend: 10/17/2017    1 Year ICM trend:       [name], RN 10/18/2017 11:40 AM

## 2017-10-28 ENCOUNTER — Ambulatory Visit (INDEPENDENT_AMBULATORY_CARE_PROVIDER_SITE_OTHER): Payer: Medicare Other

## 2017-10-28 DIAGNOSIS — Z952 Presence of prosthetic heart valve: Secondary | ICD-10-CM | POA: Diagnosis not present

## 2017-10-28 LAB — POCT INR: INR: 5.6

## 2017-10-28 NOTE — Patient Instructions (Signed)
Description   Dx: Mechanical Valve Current dose: 8.5mg  daily, except 10mg  on Tuesday, Thursday and Sunday PT: 66.7 INR:5.6 Today's changes: Hold for 1 day, then change to 8.5mg  daily, except 10mg  on Thursday Recheck: 1 week

## 2017-10-31 ENCOUNTER — Telehealth: Payer: Self-pay

## 2017-10-31 ENCOUNTER — Ambulatory Visit
Admission: RE | Admit: 2017-10-31 | Discharge: 2017-10-31 | Disposition: A | Discharge status: Home / Self Care | Payer: Medicare Other | Source: Ambulatory Visit | Attending: Radiation Oncology | Admitting: Radiation Oncology

## 2017-10-31 DIAGNOSIS — C3411 Malignant neoplasm of upper lobe, right bronchus or lung: Secondary | ICD-10-CM | POA: Diagnosis not present

## 2017-10-31 DIAGNOSIS — Z510 Encounter for antineoplastic radiation therapy: Secondary | ICD-10-CM | POA: Diagnosis not present

## 2017-10-31 NOTE — Telephone Encounter (Signed)
Patient has decided to treat with radiation at this time. I verbalized for patient to give our office a call if there was anything further that we could assist her with. She verbalized understanding.

## 2017-11-02 ENCOUNTER — Ambulatory Visit
Admission: RE | Admit: 2017-11-02 | Discharge: 2017-11-02 | Disposition: A | Discharge status: Home / Self Care | Payer: Medicare Other | Source: Ambulatory Visit | Attending: Radiation Oncology | Admitting: Radiation Oncology

## 2017-11-02 DIAGNOSIS — C3411 Malignant neoplasm of upper lobe, right bronchus or lung: Secondary | ICD-10-CM | POA: Diagnosis not present

## 2017-11-02 DIAGNOSIS — Z510 Encounter for antineoplastic radiation therapy: Secondary | ICD-10-CM | POA: Diagnosis not present

## 2017-11-04 ENCOUNTER — Ambulatory Visit: Payer: Self-pay

## 2017-11-07 ENCOUNTER — Ambulatory Visit
Admission: RE | Admit: 2017-11-07 | Discharge: 2017-11-07 | Disposition: A | Discharge status: Home / Self Care | Payer: Medicare Other | Source: Ambulatory Visit | Attending: Radiation Oncology | Admitting: Radiation Oncology

## 2017-11-07 DIAGNOSIS — C3411 Malignant neoplasm of upper lobe, right bronchus or lung: Secondary | ICD-10-CM | POA: Diagnosis not present

## 2017-11-07 DIAGNOSIS — Z510 Encounter for antineoplastic radiation therapy: Secondary | ICD-10-CM | POA: Diagnosis not present

## 2017-11-09 ENCOUNTER — Ambulatory Visit: Payer: Medicare Other | Admitting: Internal Medicine

## 2017-11-09 ENCOUNTER — Ambulatory Visit (INDEPENDENT_AMBULATORY_CARE_PROVIDER_SITE_OTHER): Payer: Medicare Other

## 2017-11-09 DIAGNOSIS — Z952 Presence of prosthetic heart valve: Secondary | ICD-10-CM | POA: Diagnosis not present

## 2017-11-09 LAB — POCT INR
INR: 4.5
PT: 53.8

## 2017-11-09 NOTE — Patient Instructions (Signed)
Description   Dx: Mechanical Valve Current dose: 8.5mg  daily, except 10mg  on Thursday- Pt was actually taking 8.5 mg daily, except for 10 mg three times weekly. PT: 53.8 INR:4.5 Today's changes: Take 8.5 mg qd. Recheck 2 weeks.

## 2017-11-10 ENCOUNTER — Ambulatory Visit
Admission: RE | Admit: 2017-11-10 | Discharge: 2017-11-10 | Disposition: A | Discharge status: Home / Self Care | Payer: Medicare Other | Source: Ambulatory Visit | Attending: Radiation Oncology | Admitting: Radiation Oncology

## 2017-11-10 DIAGNOSIS — Z510 Encounter for antineoplastic radiation therapy: Secondary | ICD-10-CM | POA: Diagnosis not present

## 2017-11-10 DIAGNOSIS — C3411 Malignant neoplasm of upper lobe, right bronchus or lung: Secondary | ICD-10-CM | POA: Diagnosis not present

## 2017-11-14 ENCOUNTER — Ambulatory Visit
Admission: RE | Admit: 2017-11-14 | Discharge: 2017-11-14 | Disposition: A | Discharge status: Home / Self Care | Payer: Medicare Other | Source: Ambulatory Visit | Attending: Radiation Oncology | Admitting: Radiation Oncology

## 2017-11-14 DIAGNOSIS — Z510 Encounter for antineoplastic radiation therapy: Secondary | ICD-10-CM | POA: Diagnosis not present

## 2017-11-14 DIAGNOSIS — C3411 Malignant neoplasm of upper lobe, right bronchus or lung: Secondary | ICD-10-CM | POA: Diagnosis not present

## 2017-11-14 DIAGNOSIS — Z87891 Personal history of nicotine dependence: Secondary | ICD-10-CM | POA: Diagnosis not present

## 2017-11-17 ENCOUNTER — Ambulatory Visit (INDEPENDENT_AMBULATORY_CARE_PROVIDER_SITE_OTHER): Payer: Medicare Other

## 2017-11-17 ENCOUNTER — Telehealth: Payer: Self-pay

## 2017-11-17 DIAGNOSIS — Z95810 Presence of automatic (implantable) cardiac defibrillator: Secondary | ICD-10-CM | POA: Diagnosis not present

## 2017-11-17 DIAGNOSIS — I5022 Chronic systolic (congestive) heart failure: Secondary | ICD-10-CM | POA: Diagnosis not present

## 2017-11-17 NOTE — Progress Notes (Signed)
EPIC Encounter for ICM Monitoring  Patient Name: Vanessa Carlson is a 57 y.o. female Date: 11/17/2017 Primary Care Physican: Virginia Crews, MD Primary Cardiologist: Carlis Abbott, NP HF Electrophysiologist: Faustino Congress Weight:Previous [id number] lbs      Attempted call to patient and unable to reach.  Left detailed message regarding transmission.  Transmission reviewed. .  Patient receiving radiation for lung cancer   Thoracic impedance normal.  Prescribed dosage: Furosemide 40 mg 1 tablet 3 times daily.   Labs: 08/31/2017 Creatinine 1.06, BUN 14, Potassium 3.9, Sodium 139, EGFR 57->60 07/08/2017 Creatinine 1.14, BUN 14, Potassium 4.2, Sodium 140, EGFR 53-62 03/23/2017 Creatinine 0.87, BUN 9, Potassium 3.7, Sodium 138, EGFR >60 02/23/2017 Creatinine 1.04, BUN 13, Potassium 3.8, Sodium 135, EGFR 58->60  02/08/2017 Creatinine 0.87, BUN 8, Potassium 3.2, Sodium 139, EGFR >60  01/28/2017 Creatinine 0.89, BUN 10, Potassium 3.6, Sodium 139, EGFR >60 12/31/2016 Creatinine 0.88, BUN 11, Potassium 3.2, Sodium 142, EGFR >60  Recommendations: Left voice mail with ICM number and encouraged to call if experiencing any fluid symptoms.  Follow-up plan: ICM clinic phone appointment on 12/19/2017.    Copy of ICM check sent to Dr. Caryl Comes.   3 month ICM trend: 11/17/2017    1 Year ICM trend:       [name], RN 11/17/2017 11:53 AM

## 2017-11-17 NOTE — Telephone Encounter (Signed)
Remote ICM transmission received.  Attempted call to patient and left deailed message per DPR regarding transmission and next ICM scheduled for 12/19/2017.  Advised to return call for any fluid symptoms or questions.

## 2017-11-23 ENCOUNTER — Ambulatory Visit (INDEPENDENT_AMBULATORY_CARE_PROVIDER_SITE_OTHER): Payer: Medicare Other

## 2017-11-23 DIAGNOSIS — Z952 Presence of prosthetic heart valve: Secondary | ICD-10-CM | POA: Diagnosis not present

## 2017-11-23 LAB — POCT INR
INR: 2.1
PT: 25.4

## 2017-11-23 NOTE — Patient Instructions (Signed)
Description   Dx: Mechanical Valve Today's changes: Take 8.5 mg every day except on Wednesday and Saturday take 10 mg Recheck 2 weeks.

## 2017-12-07 ENCOUNTER — Ambulatory Visit (INDEPENDENT_AMBULATORY_CARE_PROVIDER_SITE_OTHER): Payer: Medicare Other | Admitting: Family Medicine

## 2017-12-07 ENCOUNTER — Encounter: Payer: Self-pay | Admitting: Family Medicine

## 2017-12-07 VITALS — BP 112/60 | HR 84 | Temp 98.6°F | Resp 16 | Wt 222.0 lb

## 2017-12-07 DIAGNOSIS — Z952 Presence of prosthetic heart valve: Secondary | ICD-10-CM | POA: Diagnosis not present

## 2017-12-07 DIAGNOSIS — Z7901 Long term (current) use of anticoagulants: Secondary | ICD-10-CM | POA: Diagnosis not present

## 2017-12-07 DIAGNOSIS — I480 Paroxysmal atrial fibrillation: Secondary | ICD-10-CM

## 2017-12-07 DIAGNOSIS — I10 Essential (primary) hypertension: Secondary | ICD-10-CM

## 2017-12-07 DIAGNOSIS — G4700 Insomnia, unspecified: Secondary | ICD-10-CM

## 2017-12-07 LAB — POCT INR
INR: 4.2
PT: 50.4

## 2017-12-07 MED ORDER — TRAZODONE HCL 50 MG PO TABS: 25.0000 mg | ORAL_TABLET | Freq: Every evening | ORAL | 3 refills | Status: DC | PRN

## 2017-12-07 NOTE — Progress Notes (Signed)
Patient: Vanessa Carlson Female    DOB: 1960-09-24   57 y.o.   MRN: [id number] Visit Date: 12/07/2017  Today's Provider: Lavon Paganini, MD   I, Martha Clan, CMA, am acting as scribe for Lavon Paganini, MD.  Chief Complaint  Patient presents with  . Hypertension  . Anticoagulation   Subjective:    HPI   Hypertension, follow-up:  BP Readings from Last 3 Encounters:  12/07/17 112/60  10/05/17 115/75  09/30/17 104/68    She was last seen for hypertension 3 months ago.  BP at that visit was 112/72. Management since that visit includes none. She reports excellent compliance with treatment. She is not having side effects.  She "tries to" exercise. She is adherent to low salt diet.   Outside blood pressures are 96/60, 112/65. She is experiencing dyspnea, fatigue and palpitations.  Patient denies chest pain, chest pressure/discomfort, claudication, exertional chest pressure/discomfort, irregular heart beat, lower extremity edema, near-syncope, orthopnea and syncope.   Cardiovascular risk factors include dyslipidemia and hypertension.    Weight trend: increasing steadily Wt Readings from Last 3 Encounters:  12/07/17 222 lb (100.7 kg)  10/05/17 217 lb 7.7 oz (98.6 kg)  09/30/17 216 lb (98 kg)    Current diet: in general, a "healthy" diet    ------------------------------------------------------------------------ Anticoagulation: Patient here for anticoagulation monitoring. Indication: S/P mitral valve replacement Bleeding Signs/Symptoms:  None Thromboembolic Signs/Symptoms:  None  Missed Coumadin Doses:  None Medication Changes:  no Dietary Changes:  no Bacterial/Viral Infection:  no  Insomnia: Difficulty falling asleep and staying asleep.  Ends up sitting up and reading.  Worse since starting radiation for lung cancer.  Tried OTC sleep aides.  Falls asleep, but doesn't feel rested after taking it.   No Known Allergies   Current Outpatient  Medications:  .  aspirin 81 MG chewable tablet, Chew 81 mg by mouth daily., Disp: , Rfl:  .  atorvastatin (LIPITOR) 80 MG tablet, Take 80 mg by mouth daily., Disp: , Rfl:  .  ENTRESTO 24-26 MG, TAKE 1 TABLET BY MOUTH TWICE DAILY, Disp: 60 tablet, Rfl: 5 .  fluticasone (FLONASE) 50 MCG/ACT nasal spray, Place 2 sprays into both nostrils as needed for allergies or rhinitis., Disp: , Rfl:  .  furosemide (LASIX) 40 MG tablet, TAKE 1 TABLET BY MOUTH THREE TIMES DAILY, Disp: 270 tablet, Rfl: 3 .  metoprolol succinate (TOPROL-XL) 50 MG 24 hr tablet, Take 1/2 tablet (25 mg) by mouth two times a day. Take with or immediately following a meal., Disp: 90 tablet, Rfl: 3 .  nitroGLYCERIN (NITROSTAT) 0.3 MG SL tablet, Place 1 tablet (0.3 mg total) under the tongue every 5 (five) minutes as needed for chest pain., Disp: 30 tablet, Rfl: 3 .  oxyCODONE-acetaminophen (ROXICET) 5-325 MG tablet, Take 1 tablet by mouth every 6 (six) hours as needed., Disp: 8 tablet, Rfl: 0 .  spironolactone (ALDACTONE) 25 MG tablet, Take 1 tablet (25 mg total) by mouth daily., Disp: 90 tablet, Rfl: 3 .  traMADol (ULTRAM) 50 MG tablet, Take 1 tablet (50 mg total) every 6 (six) hours as needed by mouth., Disp: 30 tablet, Rfl: 5 .  warfarin (COUMADIN) 1 MG tablet, Take 1 tablet (1 mg total) by mouth as directed., Disp: 30 tablet, Rfl: 2 .  warfarin (COUMADIN) 2.5 MG tablet, Take 8.5 mg PO daily on Mon, Wed, Fri, and 7.5 mg PO daily on Tues, Thurs, Sat, Sun, Disp: 90 tablet, Rfl: 1 .  warfarin (COUMADIN) 7.5  MG tablet, Take 8.5 mg PO daily on Mon, Wed, Fri, and 7.5 mg PO daily on Tues, Thurs, Sat, Sun, Disp: 90 tablet, Rfl: 1   Review of Systems  Constitutional: Positive for activity change, appetite change, fatigue and unexpected weight change. Negative for chills, diaphoresis and fever.  HENT: Negative.   Respiratory: Positive for shortness of breath (some).   Cardiovascular: Positive for palpitations (baseline) and leg swelling.  Negative for chest pain.  Gastrointestinal: Negative.   Genitourinary: Negative.   Neurological: Negative.  Negative for syncope.  Hematological: Bruises/bleeds easily.  Psychiatric/Behavioral: Positive for sleep disturbance.    Social History   Tobacco Use  . Smoking status: Former Smoker    Packs/day: 1.00    Years: 4.00    Pack years: 4.00    Types: Cigarettes    Last attempt to quit: 1998    Years since quitting: 21.2  . Smokeless tobacco: Never Used  Substance Use Topics  . Alcohol use: No   Objective:   BP 112/60 (BP Location: Left Arm, Patient Position: Sitting, Cuff Size: Large)   Pulse 84   Temp 98.6 F (37 C) (Oral)   Resp 16   Wt 222 lb (100.7 kg)   LMP  (LMP Unknown)   BMI 40.60 kg/m  Vitals:   12/07/17 1111  BP: 112/60  Pulse: 84  Resp: 16  Temp: 98.6 F (37 C)  TempSrc: Oral  Weight: 222 lb (100.7 kg)     Physical Exam  Constitutional: She is oriented to person, place, and time. She appears well-developed and well-nourished. No distress.  HENT:  Head: Normocephalic and atraumatic.  Eyes: Conjunctivae are normal. No scleral icterus.  Cardiovascular: Normal rate, regular rhythm and intact distal pulses.  Murmur heard. Pulmonary/Chest: Effort normal and breath sounds normal. No respiratory distress. She has no wheezes.  Musculoskeletal: She exhibits no edema.  Neurological: She is alert and oriented to person, place, and time.  Skin: Skin is warm and dry. Capillary refill takes less than 2 seconds. No rash noted.  Psychiatric: She has a normal mood and affect. Her behavior is normal.       Assessment & Plan:   Problem List Items Addressed This Visit      Cardiovascular and Mediastinum   HTN (hypertension) - Primary (Chronic)    Well controlled Continue current meds F/u in 6 months      Paroxysmal atrial fibrillation (HCC)    Asymptomatic Continue current therapy        Other   Status post mitral valve replacement    INR  continues to swing from subtherapeutic to supratherapeutic with small dose changes She reports good compliance and minimal foods containing Vit K New dose: Coumadin 8.5mg  daily except 10mg  daily on Saturdays Recheck INR in 2 weeks  Goal INR 2.5-3.5      Chronic anticoagulation    See plan above for coumadin dosing      Insomnia    New issue Tried OTCs Discussed sleep hygiene Trial of trazodone          Return in about 3 months (around 03/08/2018) for CPE.   The entirety of the information documented in the History of Present Illness, Review of Systems and Physical Exam were personally obtained by me. Portions of this information were initially documented by Raquel Sarna Ratchford, CMA and reviewed by me for thoroughness and accuracy.    Virginia Crews, MD, MPH [facility] 12/07/2017 12:00 PM

## 2017-12-07 NOTE — Assessment & Plan Note (Signed)
New issue Tried OTCs Discussed sleep hygiene Trial of trazodone

## 2017-12-07 NOTE — Assessment & Plan Note (Signed)
Asymptomatic Continue current therapy

## 2017-12-07 NOTE — Assessment & Plan Note (Signed)
Well controlled Continue current meds F/u in 6 months

## 2017-12-07 NOTE — Assessment & Plan Note (Signed)
See plan above for coumadin dosing

## 2017-12-07 NOTE — Patient Instructions (Addendum)
New Coumadin dose schedule: 8.5mg  daily, except 10mg  on Saturdays  Insomnia Insomnia is a sleep disorder that makes it difficult to fall asleep or to stay asleep. Insomnia can cause tiredness (fatigue), low energy, difficulty concentrating, mood swings, and poor performance at work or school. There are three different ways to classify insomnia:  Difficulty falling asleep.  Difficulty staying asleep.  Waking up too early in the morning.  Any type of insomnia can be long-term (chronic) or short-term (acute). Both are common. Short-term insomnia usually lasts for three months or less. Chronic insomnia occurs at least three times a week for longer than three months. What are the causes? Insomnia may be caused by another condition, situation, or substance, such as:  Anxiety.  Certain medicines.  Gastroesophageal reflux disease (GERD) or other gastrointestinal conditions.  Asthma or other breathing conditions.  Restless legs syndrome, sleep apnea, or other sleep disorders.  Chronic pain.  Menopause. This may include hot flashes.  Stroke.  Abuse of alcohol, tobacco, or illegal drugs.  Depression.  Caffeine.  Neurological disorders, such as Alzheimer disease.  An overactive thyroid (hyperthyroidism).  The cause of insomnia may not be known. What increases the risk? Risk factors for insomnia include:  Gender. Women are more commonly affected than men.  Age. Insomnia is more common as you get older.  Stress. This may involve your professional or personal life.  Income. Insomnia is more common in people with lower income.  Lack of exercise.  Irregular work schedule or night shifts.  Traveling between different time zones.  What are the signs or symptoms? If you have insomnia, trouble falling asleep or trouble staying asleep is the main symptom. This may lead to other symptoms, such as:  Feeling fatigued.  Feeling nervous about going to sleep.  Not feeling  rested in the morning.  Having trouble concentrating.  Feeling irritable, anxious, or depressed.  How is this treated? Treatment for insomnia depends on the cause. If your insomnia is caused by an underlying condition, treatment will focus on addressing the condition. Treatment may also include:  Medicines to help you sleep.  Counseling or therapy.  Lifestyle adjustments.  Follow these instructions at home:  Take medicines only as directed by your health care provider.  Keep regular sleeping and waking hours. Avoid naps.  Keep a sleep diary to help you and your health care provider figure out what could be causing your insomnia. Include: ? When you sleep. ? When you wake up during the night. ? How well you sleep. ? How rested you feel the next day. ? Any side effects of medicines you are taking. ? What you eat and drink.  Make your bedroom a comfortable place where it is easy to fall asleep: ? Put up shades or special blackout curtains to block light from outside. ? Use a white noise machine to block noise. ? Keep the temperature cool.  Exercise regularly as directed by your health care provider. Avoid exercising right before bedtime.  Use relaxation techniques to manage stress. Ask your health care provider to suggest some techniques that may work well for you. These may include: ? Breathing exercises. ? Routines to release muscle tension. ? Visualizing peaceful scenes.  Cut back on alcohol, caffeinated beverages, and cigarettes, especially close to bedtime. These can disrupt your sleep.  Do not overeat or eat spicy foods right before bedtime. This can lead to digestive discomfort that can make it hard for you to sleep.  Limit screen use before bedtime.  This includes: ? Watching TV. ? Using your smartphone, tablet, and computer.  Stick to a routine. This can help you fall asleep faster. Try to do a quiet activity, brush your teeth, and go to bed at the same time each  night.  Get out of bed if you are still awake after 15 minutes of trying to sleep. Keep the lights down, but try reading or doing a quiet activity. When you feel sleepy, go back to bed.  Make sure that you drive carefully. Avoid driving if you feel very sleepy.  Keep all follow-up appointments as directed by your health care provider. This is important. Contact a health care provider if:  You are tired throughout the day or have trouble in your daily routine due to sleepiness.  You continue to have sleep problems or your sleep problems get worse. Get help right away if:  You have serious thoughts about hurting yourself or someone else. This information is not intended to replace advice given to you by your health care provider. Make sure you discuss any questions you have with your health care provider. Document Released: 08/13/2000 Document Revised: 01/16/2016 Document Reviewed: 05/17/2014 Elsevier Interactive Patient Education  [employer].

## 2017-12-07 NOTE — Assessment & Plan Note (Signed)
INR continues to swing from subtherapeutic to supratherapeutic with small dose changes She reports good compliance and minimal foods containing Vit K New dose: Coumadin 8.5mg  daily except 10mg  daily on Saturdays Recheck INR in 2 weeks  Goal INR 2.5-3.5

## 2017-12-15 ENCOUNTER — Other Ambulatory Visit: Payer: Self-pay | Admitting: *Deleted

## 2017-12-15 ENCOUNTER — Other Ambulatory Visit: Payer: Self-pay

## 2017-12-15 ENCOUNTER — Encounter: Payer: Self-pay | Admitting: Radiation Oncology

## 2017-12-15 ENCOUNTER — Ambulatory Visit
Admission: RE | Admit: 2017-12-15 | Discharge: 2017-12-15 | Disposition: A | Discharge status: Home / Self Care | Payer: Medicare Other | Source: Ambulatory Visit | Attending: Radiation Oncology | Admitting: Radiation Oncology

## 2017-12-15 VITALS — BP 126/73 | HR 80 | Resp 18 | Wt 219.4 lb

## 2017-12-15 DIAGNOSIS — Z08 Encounter for follow-up examination after completed treatment for malignant neoplasm: Secondary | ICD-10-CM | POA: Insufficient documentation

## 2017-12-15 DIAGNOSIS — Z85118 Personal history of other malignant neoplasm of bronchus and lung: Secondary | ICD-10-CM | POA: Insufficient documentation

## 2017-12-15 DIAGNOSIS — Z923 Personal history of irradiation: Secondary | ICD-10-CM | POA: Insufficient documentation

## 2017-12-15 DIAGNOSIS — C3411 Malignant neoplasm of upper lobe, right bronchus or lung: Secondary | ICD-10-CM

## 2017-12-15 NOTE — Progress Notes (Signed)
Radiation Oncology Follow up Note  Name: Vanessa Carlson   Date:   12/15/2017 MRN:  [id number] DOB: 1960/12/29    This 57 y.o. 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 Seen today in routine follow-up she is doing well. She specifically denies any dysphagia cough or skin reaction..  COMPLICATIONS OF TREATMENT: none  FOLLOW UP COMPLIANCE: keeps appointments   PHYSICAL EXAM:  BP 126/73   Pulse 80   Resp 18   Wt 219 lb 5.7 oz (99.5 kg)   LMP  (LMP Unknown)   BMI 40.12 kg/m  Well-developed well-nourished patient in NAD. HEENT reveals PERLA, EOMI, discs not visualized.  Oral cavity is clear. No oral mucosal lesions are identified. Neck is clear without evidence of cervical or supraclavicular adenopathy. Lungs are clear to A&P. Cardiac examination is essentially unremarkable with regular rate and rhythm without murmur rub or thrill. Abdomen is benign with no organomegaly or masses noted. Motor sensory and DTR levels are equal and symmetric in the upper and lower extremities. Cranial nerves II through XII are grossly intact. Proprioception is intact. No peripheral adenopathy or edema is identified. No motor or sensory levels are noted. Crude visual fields are within normal range.  RADIOLOGY RESULTS: no current films for review  PLAN: present time patient is recovered nicely from SB RT treatment. I'm please were overall progress. I've asked to see her back in 3 months for follow-up and ordered a CT scan of the chest with contrast prior to that visit. Patient knows to call sooner with any concerns at any time.  I would like to take this opportunity to thank you for allowing me to participate in the care of your patient.Noreene Filbert,  MD

## 2017-12-19 ENCOUNTER — Telehealth: Payer: Self-pay

## 2017-12-19 ENCOUNTER — Ambulatory Visit (INDEPENDENT_AMBULATORY_CARE_PROVIDER_SITE_OTHER): Payer: Medicare Other | Admitting: *Deleted

## 2017-12-19 DIAGNOSIS — Z7901 Long term (current) use of anticoagulants: Secondary | ICD-10-CM

## 2017-12-19 DIAGNOSIS — I5022 Chronic systolic (congestive) heart failure: Secondary | ICD-10-CM

## 2017-12-19 DIAGNOSIS — Z95810 Presence of automatic (implantable) cardiac defibrillator: Secondary | ICD-10-CM | POA: Diagnosis not present

## 2017-12-19 DIAGNOSIS — I255 Ischemic cardiomyopathy: Secondary | ICD-10-CM

## 2017-12-19 NOTE — Telephone Encounter (Signed)
Remote ICM transmission received.  Attempted call to patient and left detailed message per DPR regarding transmission and next ICM scheduled for 01/19/2018.  Advised to return call for any fluid symptoms or questions.

## 2017-12-19 NOTE — Progress Notes (Signed)
EPIC Encounter for ICM Monitoring  Patient Name: Vanessa Carlson is a 57 y.o. female Date: 12/19/2017 Primary Care Physican: Virginia Crews, MD Primary Cardiologist: Carlis Abbott, NP HF Electrophysiologist: Faustino Congress [id number] lbs(at a recent office visit)      Attempted call to patient and unable to reach.  Left detailed message regarding transmission.  Transmission reviewed.    Thoracic impedance normal.  Prescribed dosage: Furosemide 40 mg 1 tablet 3 times daily.   Labs: 08/31/2017 Creatinine 1.06, BUN 14, Potassium 3.9, Sodium 139, EGFR 57->60 07/08/2017 Creatinine 1.14, BUN 14, Potassium 4.2, Sodium 140, EGFR 53-62 03/23/2017 Creatinine 0.87, BUN 9, Potassium 3.7, Sodium 138, EGFR >60 02/23/2017 Creatinine 1.04, BUN 13, Potassium 3.8, Sodium 135, EGFR 58->60  02/08/2017 Creatinine 0.87, BUN 8, Potassium 3.2, Sodium 139, EGFR >60  01/28/2017 Creatinine 0.89, BUN 10, Potassium 3.6, Sodium 139, EGFR >60 12/31/2016 Creatinine 0.88, BUN 11, Potassium 3.2, Sodium 142, EGFR >60  Recommendations:  Left voice mail with ICM number and encouraged to call if experiencing any fluid symptoms.  Follow-up plan: ICM clinic phone appointment on 01/19/2018.  Office appointment scheduled 12/21/2017 with Dr. Saunders Revel.  Copy of ICM check sent to Dr. Caryl Comes.   3 month ICM trend: 12/19/2017    1 Year ICM trend:       [name], RN 12/19/2017 10:51 AM

## 2017-12-19 NOTE — Progress Notes (Signed)
Remote ICD transmission.   

## 2017-12-20 ENCOUNTER — Encounter: Payer: Self-pay | Admitting: Cardiology

## 2017-12-20 LAB — CUP PACEART REMOTE DEVICE CHECK
Brady Statistic RV Percent Paced: 0.01 %
Date Time Interrogation Session: 20190422084223
HIGH POWER IMPEDANCE MEASURED VALUE: 74 Ohm
Implantable Lead Implant Date: 20160802
Lead Channel Impedance Value: 608 Ohm
Lead Channel Impedance Value: 665 Ohm
Lead Channel Sensing Intrinsic Amplitude: 10.625 mV
Lead Channel Sensing Intrinsic Amplitude: 10.625 mV
Lead Channel Setting Sensing Sensitivity: 0.3 mV
MDC IDC LEAD LOCATION: 753860
MDC IDC MSMT BATTERY REMAINING LONGEVITY: 120 mo
MDC IDC MSMT BATTERY VOLTAGE: 3.01 V
MDC IDC MSMT LEADCHNL RV PACING THRESHOLD AMPLITUDE: 1 V
MDC IDC MSMT LEADCHNL RV PACING THRESHOLD PULSEWIDTH: 0.4 ms
MDC IDC PG IMPLANT DT: 20160802
MDC IDC SET LEADCHNL RV PACING AMPLITUDE: 2.25 V
MDC IDC SET LEADCHNL RV PACING PULSEWIDTH: 0.4 ms

## 2017-12-21 ENCOUNTER — Encounter: Payer: Self-pay | Admitting: Internal Medicine

## 2017-12-21 ENCOUNTER — Ambulatory Visit (INDEPENDENT_AMBULATORY_CARE_PROVIDER_SITE_OTHER): Payer: Medicare Other | Admitting: Internal Medicine

## 2017-12-21 ENCOUNTER — Ambulatory Visit: Payer: Medicare Other

## 2017-12-21 VITALS — BP 100/58 | HR 75 | Ht 62.5 in | Wt 223.0 lb

## 2017-12-21 DIAGNOSIS — Z79899 Other long term (current) drug therapy: Secondary | ICD-10-CM | POA: Diagnosis not present

## 2017-12-21 DIAGNOSIS — I2510 Atherosclerotic heart disease of native coronary artery without angina pectoris: Secondary | ICD-10-CM | POA: Diagnosis not present

## 2017-12-21 DIAGNOSIS — I5022 Chronic systolic (congestive) heart failure: Secondary | ICD-10-CM

## 2017-12-21 DIAGNOSIS — Z952 Presence of prosthetic heart valve: Secondary | ICD-10-CM | POA: Diagnosis not present

## 2017-12-21 DIAGNOSIS — I480 Paroxysmal atrial fibrillation: Secondary | ICD-10-CM | POA: Diagnosis not present

## 2017-12-21 DIAGNOSIS — I10 Essential (primary) hypertension: Secondary | ICD-10-CM | POA: Diagnosis not present

## 2017-12-21 DIAGNOSIS — I099 Rheumatic heart disease, unspecified: Secondary | ICD-10-CM

## 2017-12-21 NOTE — Patient Instructions (Signed)
Medication Instructions:  Your physician recommends that you continue on your current medications as directed. Please refer to the Current Medication list given to you today.   Labwork: Your physician recommends that you return for lab work in: [place] (bmet).   Testing/Procedures: Your physician has requested that you have an echocardiogram. Echocardiography is a painless test that uses sound waves to create images of your heart. It provides your doctor with information about the size and shape of your heart and how well your heart's chambers and valves are working. This procedure takes approximately one hour. There are no restrictions for this procedure.  You may receive an ultrasound enhancing agent through an IV if it is needed to better visualize your heart.   Follow-Up: Your physician recommends that you schedule a follow-up appointment in: 3 MONTHS WITH DR END.    If you need a refill on your cardiac medications before your next appointment, please call your pharmacy.   Echocardiogram An echocardiogram, or echocardiography, uses sound waves (ultrasound) to produce an image of your heart. The echocardiogram is simple, painless, obtained within a short period of time, and offers valuable information to your health care provider. The images from an echocardiogram can provide information such as:  Evidence of coronary artery disease (CAD).  Heart size.  Heart muscle function.  Heart valve function.  Aneurysm detection.  Evidence of a past heart attack.  Fluid buildup around the heart.  Heart muscle thickening.  Assess heart valve function.  Tell a health care provider about:  Any allergies you have.  All medicines you are taking, including vitamins, herbs, eye drops, creams, and over-the-counter medicines.  Any problems you or family members have had with anesthetic medicines.  Any blood disorders you have.  Any surgeries you have had.  Any medical conditions you  have.  Whether you are pregnant or may be pregnant. What happens before the procedure? No special preparation is needed. Eat and drink normally. What happens during the procedure?  In order to produce an image of your heart, gel will be applied to your chest and a wand-like tool (transducer) will be moved over your chest. The gel will help transmit the sound waves from the transducer. The sound waves will harmlessly bounce off your heart to allow the heart images to be captured in real-time motion. These images will then be recorded.  You may need an IV to receive a medicine that improves the quality of the pictures. What happens after the procedure? You may return to your normal schedule including diet, activities, and medicines, unless your health care provider tells you otherwise. This information is not intended to replace advice given to you by your health care provider. Make sure you discuss any questions you have with your health care provider. Document Released: 08/13/2000 Document Revised: 04/03/2016 Document Reviewed: 04/23/2013 Elsevier Interactive Patient Education  2017 [employer].

## 2017-12-21 NOTE — Progress Notes (Signed)
Follow-up Outpatient Visit Date: 12/21/2017  Primary Care Provider: Virginia Crews, MD [address] [place] [address]  Chief Complaint: Shortness of breath  HPI:  [name] is a 57 y.o. 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  She was last seen in our office by Ignacia Bayley, NP, in January, at which time she was doing well from a heart standpoint.  She was undergoing further evaluation of a recently discovered right upper lobe long nodule concerning for bronchogenic carcinoma.  Biopsy confirmed adenocarcinoma.  She is currently undergoing XRT of her stage I lung cancer.  Today, [name] reports feeling well with the exception of shortness of breath that worsened while receiving radiation treatments.  This is gradually improving since her last XRT in March.  She denies chest pain,, lightheadedness, and orthopnea.  Weight has increased over the last few months, which she attributes to inactivity due to her cancer treatment.  She noted some leg swelling today but otherwise has not had any significant edema.  She also notes occasional "fast heartbeats" the last a few seconds.  There are no associated symptoms.  She remains compliant with her medications as well as a low-sodium diet.  --------------------------------------------------------------------------------------------------  Cardiovascular History & Procedures: Cardiovascular Problems:  Coronary artery disease status post anterior STEMI (2016)  Ischemic cardiomyopathy with chronic systolic heart failure  Rheumatic heart disease status post  mechanical mitral valve replacement (2006)  Risk Factors:  Known coronary artery disease and hypertension  Cath/PCI:  LHC (2016, [place]): Reportedly proximal occlusion of the LAD, which could not be crossed with a wire.  CV Surgery:  Mitral valve replacement (2006, [facility]): 27 mm St. Jude bileaflet valve  EP Procedures and Devices:  ICD (04/01/15, [facility]): Medtronic single-chamber ICD  Non-Invasive Evaluation(s):  TTE (11/20/15): Normal LV size and wall thickness. LVEF 20-25% with severe diastolic dysfunction. Mildly reduced RV contraction. Normal MV prosthesis function.   Recent CV Pertinent Labs: Lab Results  Component Value Date   CHOL 133 07/08/2017   HDL 47 (L) 07/08/2017   LDLCALC 66 07/08/2017   TRIG 121 07/08/2017   CHOLHDL 2.8 07/08/2017   INR 4.2 12/07/2017   INR 1.10 09/23/2017   K 3.9 08/31/2017   BUN 14 08/31/2017   CREATININE 1.06 (H) 08/31/2017   CREATININE 1.14 (H) 07/08/2017    Past medical and surgical history were reviewed and updated in EPIC.  Current Meds  Medication Sig  . aspirin 81 MG chewable tablet Chew 81 mg by mouth daily.  [name] atorvastatin (LIPITOR) 80 MG tablet Take 80 mg by mouth daily.  [name] ENTRESTO 24-26 MG TAKE 1 TABLET BY MOUTH TWICE DAILY  . fluticasone (FLONASE) 50 MCG/ACT nasal spray Place 2 sprays into both nostrils as needed for allergies or rhinitis.  . furosemide (LASIX) 40 MG tablet TAKE 1 TABLET BY MOUTH THREE TIMES DAILY  . metoprolol succinate (TOPROL-XL) 50 MG 24 hr tablet Take 1/2 tablet (25 mg) by mouth two times a day. Take with or immediately following a meal.  . nitroGLYCERIN (NITROSTAT) 0.3 MG SL tablet Place 1 tablet (0.3 mg total) under the tongue every 5 (five) minutes as needed for chest pain.  [name] spironolactone (ALDACTONE)  25 MG tablet Take 1 tablet (25 mg total) by mouth daily.  . traMADol (ULTRAM) 50 MG tablet Take 1 tablet (50 mg total) every 6 (six) hours as needed by mouth.  . traZODone (DESYREL) 50 MG tablet  Take 0.5-1 tablets (25-50 mg total) by mouth at bedtime as needed for sleep.  [name] warfarin (COUMADIN) 1 MG tablet Take 1 tablet (1 mg total) by mouth as directed.  . warfarin (COUMADIN) 2.5 MG tablet Take 8.5 mg PO daily on Mon, Wed, Fri, and 7.5 mg PO daily on Tues, Thurs, Sat, Sun  . warfarin (COUMADIN) 7.5 MG tablet Take 8.5 mg PO daily on Mon, Wed, Fri, and 7.5 mg PO daily on Tues, Thurs, Sat, Sun    Allergies: Patient has no known allergies.  Social History   Tobacco Use  . Smoking status: Former Smoker    Packs/day: 1.00    Years: 4.00    Pack years: 4.00    Types: Cigarettes    Last attempt to quit: 1998    Years since quitting: 21.3  . Smokeless tobacco: Never Used  Substance Use Topics  . Alcohol use: No  . Drug use: No    Family History  Problem Relation Age of Onset  . Heart attack Mother   . Hypertension Mother   . Diabetes Mother   . Emphysema Father        smoker  . Diabetes Maternal Grandmother   . Kidney disease Maternal Grandmother   . Breast cancer Maternal Aunt   . Breast cancer Maternal Aunt   . Colon cancer Maternal Uncle     Review of Systems: A 12-system review of systems was performed and was negative except as noted in the HPI.  --------------------------------------------------------------------------------------------------  Physical Exam: BP (!) 100/58 (BP Location: Left Arm, Patient Position: Sitting, Cuff Size: Normal)   Pulse 75   Ht 5' 2.5" (1.588 m)   Wt 223 lb (101.2 kg)   LMP  (LMP Unknown)   BMI 40.14 kg/m   General: NAD. HEENT: No conjunctival pallor or scleral icterus. Moist mucous membranes.  OP clear. Neck: Supple without lymphadenopathy, thyromegaly, JVD, or HJR.  Lungs: Normal work of breathing. Clear to auscultation bilaterally without wheezes or crackles. Heart: Regular rate and rhythm with 1/6 systolic murmur loudest at the left lower sternal border.  No rubs or gallops.  Unable to assess PMI due to body habitus. Abd:  Bowel sounds present. Soft, NT/ND.  Unable to assess PMI due to body habitus. Ext: 1+ distal calf edema bilaterally.. Radial, PT, and DP pulses are 2+ bilaterally. Skin: Warm and dry without rash.  EKG: NSR with nonspecific T wave abnormality.  No significant change since 09/13/2017.  Lab Results  Component Value Date   WBC 6.7 09/23/2017   HGB 13.6 09/23/2017   HCT 41.4 09/23/2017   MCV 85.2 09/23/2017   PLT 167 09/23/2017    Lab Results  Component Value Date   NA 139 08/31/2017   K 3.9 08/31/2017   CL 104 08/31/2017   CO2 27 08/31/2017   BUN 14 08/31/2017   CREATININE 1.06 (H) 08/31/2017   GLUCOSE 113 (H) 08/31/2017   ALT 12 07/08/2017    Lab Results  Component Value Date   CHOL 133 07/08/2017   HDL 47 (L) 07/08/2017   LDLCALC 66 07/08/2017   TRIG 121 07/08/2017   CHOLHDL 2.8 07/08/2017    --------------------------------------------------------------------------------------------------  ASSESSMENT AND PLAN: Chronic systolic heart failure [name] reports increased shortness of breath  around the time of her XRT, though this is gradually improving.  She has gained 7 pounds since early February.  She also has mild leg edema on exam today.  I have encouraged her to continue her current regimen of furosemide 40 mg 3 times daily and to let us know if her weight/edema worsen.  Importance of sodium restriction was stressed.  We have agreed to obtain a transthoracic echocardiogram to assess for new valvular pathology, given her history of rheumatic heart disease and mitral valve replacement in the setting of severely reduced LVEF.  I will also check a BMP today, given ongoing spironolactone use.  Of note thoracic impedance on recent ICD monitoring earlier this week was normal.  Continue current doses of metoprolol, spironolactone, and Entresto; soft blood pressure precludes up titration today.  Coronary artery disease without angina No chest pain.  Continue current  medications for secondary prevention.  Rheumatic heart disease status post mechanical mitral valve replacement Continue current medications, including indefinite aspirin and warfarin.  Importance of prophylactic antibiotics for dental procedures stressed.  Will obtain transthoracic echocardiogram given increased shortness of breath and edema.  Paroxysmal atrial fibrillation Occasional rapid heartbeats noted.  EKG today demonstrates sinus rhythm.  Continue indefinite anticoagulation as well as metoprolol succinate.  Hyperlipidemia Goal LDL less than 70.  Most recent LDL was 66.  We will continue atorvastatin 80 mg daily.  Morbid obesity BMI greater than 40 with multiple comorbidities.  Encouraged [name] to increase her activity as tolerated with the hope of losing weight.  Lung cancer [name] has completed her course of XRT.  Continue follow-up with oncology and thoracic surgery, as previously discussed.  Follow-up: Return to clinic in 3 months.  Follow-up: Return to clinic in 3 months.  [name], MD 12/21/2017 10:25 AM

## 2017-12-22 ENCOUNTER — Encounter: Payer: Self-pay | Admitting: Internal Medicine

## 2017-12-22 LAB — BASIC METABOLIC PANEL
BUN/Creatinine Ratio: 10 (ref 9–23)
BUN: 9 mg/dL (ref 6–24)
CALCIUM: 9.3 mg/dL (ref 8.7–10.2)
CO2: 27 mmol/L (ref 20–29)
CREATININE: 0.91 mg/dL (ref 0.57–1.00)
Chloride: 103 mmol/L (ref 96–106)
GFR, EST AFRICAN AMERICAN: 81 mL/min/{1.73_m2} (ref >59)
GFR, EST NON AFRICAN AMERICAN: 70 mL/min/{1.73_m2} (ref >59)
Glucose: 116 mg/dL — ABNORMAL HIGH (ref 65–99)
POTASSIUM: 3.9 mmol/L (ref 3.5–5.2)
Sodium: 143 mmol/L (ref 134–144)

## 2017-12-23 ENCOUNTER — Ambulatory Visit (INDEPENDENT_AMBULATORY_CARE_PROVIDER_SITE_OTHER): Payer: Medicare Other

## 2017-12-23 ENCOUNTER — Ambulatory Visit: Payer: Medicare Other

## 2017-12-23 DIAGNOSIS — Z952 Presence of prosthetic heart valve: Secondary | ICD-10-CM

## 2017-12-23 DIAGNOSIS — I255 Ischemic cardiomyopathy: Secondary | ICD-10-CM

## 2017-12-23 LAB — POCT INR
AVAILABLE: 47
INR: 3.9

## 2017-12-23 NOTE — Patient Instructions (Signed)
Description   Dx: Mechanical Valve Today's changes: Take 8.5 mg every daily.  Recheck 2 weeks.

## 2018-01-05 ENCOUNTER — Other Ambulatory Visit: Payer: Self-pay

## 2018-01-05 ENCOUNTER — Ambulatory Visit (INDEPENDENT_AMBULATORY_CARE_PROVIDER_SITE_OTHER): Payer: Medicare Other

## 2018-01-05 ENCOUNTER — Ambulatory Visit (INDEPENDENT_AMBULATORY_CARE_PROVIDER_SITE_OTHER): Payer: Medicare Other | Admitting: Family Medicine

## 2018-01-05 DIAGNOSIS — I2510 Atherosclerotic heart disease of native coronary artery without angina pectoris: Secondary | ICD-10-CM

## 2018-01-05 DIAGNOSIS — Z952 Presence of prosthetic heart valve: Secondary | ICD-10-CM

## 2018-01-05 DIAGNOSIS — I099 Rheumatic heart disease, unspecified: Secondary | ICD-10-CM

## 2018-01-05 DIAGNOSIS — I5022 Chronic systolic (congestive) heart failure: Secondary | ICD-10-CM

## 2018-01-05 LAB — POCT INR
INR: 3.4
PT: 41.1

## 2018-01-05 MED ORDER — PERFLUTREN LIPID MICROSPHERE
1.0000 mL | INTRAVENOUS | Status: AC | PRN
  Administered 2018-01-05: 2 mL via INTRAVENOUS

## 2018-01-05 NOTE — Progress Notes (Signed)
       Patient: Vanessa Carlson Female    DOB: September 29, 1960   57 y.o.   MRN: [id number] Visit Date: 01/05/2018  Today's Provider: Lavon Paganini, MD   Patient here for INR check only, though she was initially scheduled for 36m f/u (this was done recently).  I did not evaluate the patient.  Results for orders placed or performed in visit on 01/05/18  POCT INR  Result Value Ref Range   INR 3.4    PT 41.1     INR within appropriate range.  Patient to continue current dose and f/u in 4 weeks.  Virginia Crews, MD, MPH [facility] 01/05/2018 10:21 AM

## 2018-01-06 ENCOUNTER — Ambulatory Visit: Payer: Self-pay

## 2018-01-18 ENCOUNTER — Ambulatory Visit (INDEPENDENT_AMBULATORY_CARE_PROVIDER_SITE_OTHER): Payer: Medicare Other

## 2018-01-18 DIAGNOSIS — Z952 Presence of prosthetic heart valve: Secondary | ICD-10-CM | POA: Diagnosis not present

## 2018-01-18 LAB — POCT INR
INR: 4.9 — AB (ref 2.0–3.0)
PT: 59.2

## 2018-01-19 ENCOUNTER — Ambulatory Visit (INDEPENDENT_AMBULATORY_CARE_PROVIDER_SITE_OTHER): Payer: Medicare Other

## 2018-01-19 DIAGNOSIS — I5022 Chronic systolic (congestive) heart failure: Secondary | ICD-10-CM | POA: Diagnosis not present

## 2018-01-19 DIAGNOSIS — Z95810 Presence of automatic (implantable) cardiac defibrillator: Secondary | ICD-10-CM

## 2018-01-20 ENCOUNTER — Telehealth: Payer: Self-pay

## 2018-01-20 NOTE — Telephone Encounter (Signed)
Remote ICM transmission received.  Attempted call to patient and left detailed message, per DPR, regarding transmission and next ICM scheduled for 02/20/2018.  Advised to return call for any fluid symptoms or questions.

## 2018-01-20 NOTE — Progress Notes (Signed)
EPIC Encounter for ICM Monitoring  Patient Name: Vanessa Carlson is a 57 y.o. female Date: 01/20/2018 Primary Care Physican: Bacigalupo, Angela M, MD Primary Cardiologist: End/Tina Hackney, NP HF Electrophysiologist: Klein Dry Weight:223 lbs(at 12/21/17 office visit)       Attempted call to patient and unable to reach.  Left detailed message, per DPR, regarding transmission.  Transmission reviewed.    Thoracic impedance normal.  Prescribed dosage: Furosemide 40 mg 1 tablet 3 times daily.   Labs: 08/31/2017 Creatinine 1.06, BUN 14, Potassium 3.9, Sodium 139, EGFR 57->60 07/08/2017 Creatinine 1.14, BUN 14, Potassium 4.2, Sodium 140, EGFR 53-62 03/23/2017 Creatinine 0.87, BUN 9, Potassium 3.7, Sodium 138, EGFR >60 02/23/2017 Creatinine 1.04, BUN 13, Potassium 3.8, Sodium 135, EGFR 58->60  02/08/2017 Creatinine 0.87, BUN 8, Potassium 3.2, Sodium 139, EGFR >60  01/28/2017 Creatinine 0.89, BUN 10, Potassium 3.6, Sodium 139, EGFR >60 12/31/2016 Creatinine 0.88, BUN 11, Potassium 3.2, Sodium 142, EGFR >60  Recommendations:  Left voice mail with ICM number and encouraged to call if experiencing any fluid symptoms.  Follow-up plan: ICM clinic phone appointment on 02/20/2018.    Copy of ICM check sent to Dr. Klein.   3 month ICM trend: 01/19/2018    1 Year ICM trend:        S , RN 01/20/2018 8:37 AM   

## 2018-02-01 ENCOUNTER — Ambulatory Visit (INDEPENDENT_AMBULATORY_CARE_PROVIDER_SITE_OTHER): Payer: Medicare Other

## 2018-02-01 DIAGNOSIS — Z952 Presence of prosthetic heart valve: Secondary | ICD-10-CM | POA: Diagnosis not present

## 2018-02-01 LAB — POCT INR
INR: 3.3 — AB (ref 2.0–3.0)
PT: 39.9

## 2018-02-01 NOTE — Patient Instructions (Signed)
Continue current dose 7.5mg  M, W, F, Sun and 8.5mg  T, Th, Sat.  Recheck in Three weeks.

## 2018-02-13 ENCOUNTER — Other Ambulatory Visit: Payer: Self-pay

## 2018-02-13 MED ORDER — ATORVASTATIN CALCIUM 80 MG PO TABS: 80.0000 mg | ORAL_TABLET | Freq: Every day | ORAL | 1 refills | Status: DC

## 2018-02-20 ENCOUNTER — Ambulatory Visit (INDEPENDENT_AMBULATORY_CARE_PROVIDER_SITE_OTHER): Payer: Medicare Other

## 2018-02-20 ENCOUNTER — Other Ambulatory Visit: Payer: Self-pay | Admitting: *Deleted

## 2018-02-20 DIAGNOSIS — I5022 Chronic systolic (congestive) heart failure: Secondary | ICD-10-CM | POA: Diagnosis not present

## 2018-02-20 DIAGNOSIS — Z95810 Presence of automatic (implantable) cardiac defibrillator: Secondary | ICD-10-CM

## 2018-02-20 NOTE — Telephone Encounter (Signed)
Requesting rx for warfarin 7.5 mg qd sent to [facility].

## 2018-02-21 ENCOUNTER — Telehealth: Payer: Self-pay

## 2018-02-21 NOTE — Telephone Encounter (Signed)
Remote ICM transmission received.  Attempted call to patient and left detailed message, per DPR, regarding transmission and next ICM scheduled for 03/28/2018.  Advised to return call for any fluid symptoms or questions.

## 2018-02-21 NOTE — Progress Notes (Signed)
EPIC Encounter for ICM Monitoring  Patient Name: Vanessa Carlson is a 57 y.o. female Date: 02/21/2018 Primary Care Physican: Virginia Crews, MD Primary Cardiologist: Carlis Abbott, NP HF Electrophysiologist: Faustino Congress Weight:Previous [id number](JT 12/21/17 office visit)       Attempted call to patient and unable to reach.  Left detailed message, per DPR, regarding transmission.  Transmission reviewed.    Thoracic impedance normal.  Prescribed dosage: Furosemide 40 mg 1 tablet 3 times daily.   Labs: 08/31/2017 Creatinine 1.06, BUN 14, Potassium 3.9, Sodium 139, EGFR 57->60 07/08/2017 Creatinine 1.14, BUN 14, Potassium 4.2, Sodium 140, EGFR 53-62 03/23/2017 Creatinine 0.87, BUN 9, Potassium 3.7, Sodium 138, EGFR >60 02/23/2017 Creatinine 1.04, BUN 13, Potassium 3.8, Sodium 135, EGFR 58->60  02/08/2017 Creatinine 0.87, BUN 8, Potassium 3.2, Sodium 139, EGFR >60  01/28/2017 Creatinine 0.89, BUN 10, Potassium 3.6, Sodium 139, EGFR >60 12/31/2016 Creatinine 0.88, BUN 11, Potassium 3.2, Sodium 142, EGFR >60  Recommendations: Left voice mail with ICM number and encouraged to call if experiencing any fluid symptoms.  Follow-up plan: ICM clinic phone appointment on 03/28/2018.  Office appointment scheduled 02/28/2018 with Linden Dolin.   Copy of ICM check sent to Dr. Caryl Comes.   3 month ICM trend: 02/20/2018    1 Year ICM trend:       [name], RN 02/21/2018 12:06 PM

## 2018-02-22 ENCOUNTER — Ambulatory Visit (INDEPENDENT_AMBULATORY_CARE_PROVIDER_SITE_OTHER): Payer: Medicare Other

## 2018-02-22 DIAGNOSIS — Z952 Presence of prosthetic heart valve: Secondary | ICD-10-CM

## 2018-02-22 LAB — POCT INR
INR: 3.4 — AB (ref 2.0–3.0)
PT: 40.7

## 2018-02-22 MED ORDER — WARFARIN SODIUM 7.5 MG PO TABS
ORAL_TABLET | ORAL | 1 refills | Status: DC
Start: 2018-02-22 — End: 2018-09-18

## 2018-02-22 NOTE — Patient Instructions (Signed)
Description   Dx: Mechanical Valve   Continue current dose 7.5mg  M, W, F, Sun and 8.5mg  T, Th, Sat.  Recheck in 3 weeks.

## 2018-02-28 ENCOUNTER — Ambulatory Visit: Payer: Medicare Other | Attending: Family | Admitting: Family

## 2018-02-28 ENCOUNTER — Encounter: Payer: Self-pay | Admitting: Family

## 2018-02-28 VITALS — BP 135/71 | HR 82 | Resp 18 | Ht 62.0 in | Wt 224.4 lb

## 2018-02-28 DIAGNOSIS — I5022 Chronic systolic (congestive) heart failure: Secondary | ICD-10-CM | POA: Diagnosis not present

## 2018-02-28 DIAGNOSIS — Z90710 Acquired absence of both cervix and uterus: Secondary | ICD-10-CM | POA: Insufficient documentation

## 2018-02-28 DIAGNOSIS — C3490 Malignant neoplasm of unspecified part of unspecified bronchus or lung: Secondary | ICD-10-CM | POA: Insufficient documentation

## 2018-02-28 DIAGNOSIS — Z825 Family history of asthma and other chronic lower respiratory diseases: Secondary | ICD-10-CM | POA: Diagnosis not present

## 2018-02-28 DIAGNOSIS — I4891 Unspecified atrial fibrillation: Secondary | ICD-10-CM | POA: Diagnosis not present

## 2018-02-28 DIAGNOSIS — Z7901 Long term (current) use of anticoagulants: Secondary | ICD-10-CM | POA: Insufficient documentation

## 2018-02-28 DIAGNOSIS — I2510 Atherosclerotic heart disease of native coronary artery without angina pectoris: Secondary | ICD-10-CM | POA: Diagnosis not present

## 2018-02-28 DIAGNOSIS — I255 Ischemic cardiomyopathy: Secondary | ICD-10-CM | POA: Insufficient documentation

## 2018-02-28 DIAGNOSIS — Z8541 Personal history of malignant neoplasm of cervix uteri: Secondary | ICD-10-CM | POA: Insufficient documentation

## 2018-02-28 DIAGNOSIS — Z923 Personal history of irradiation: Secondary | ICD-10-CM | POA: Diagnosis not present

## 2018-02-28 DIAGNOSIS — Z79899 Other long term (current) drug therapy: Secondary | ICD-10-CM | POA: Insufficient documentation

## 2018-02-28 DIAGNOSIS — I110 Hypertensive heart disease with heart failure: Secondary | ICD-10-CM | POA: Diagnosis not present

## 2018-02-28 DIAGNOSIS — Z7982 Long term (current) use of aspirin: Secondary | ICD-10-CM | POA: Insufficient documentation

## 2018-02-28 DIAGNOSIS — Z95810 Presence of automatic (implantable) cardiac defibrillator: Secondary | ICD-10-CM | POA: Insufficient documentation

## 2018-02-28 DIAGNOSIS — I252 Old myocardial infarction: Secondary | ICD-10-CM | POA: Insufficient documentation

## 2018-02-28 DIAGNOSIS — Z8249 Family history of ischemic heart disease and other diseases of the circulatory system: Secondary | ICD-10-CM | POA: Insufficient documentation

## 2018-02-28 DIAGNOSIS — I480 Paroxysmal atrial fibrillation: Secondary | ICD-10-CM

## 2018-02-28 DIAGNOSIS — Z952 Presence of prosthetic heart valve: Secondary | ICD-10-CM | POA: Insufficient documentation

## 2018-02-28 DIAGNOSIS — Z87891 Personal history of nicotine dependence: Secondary | ICD-10-CM | POA: Diagnosis not present

## 2018-02-28 DIAGNOSIS — I10 Essential (primary) hypertension: Secondary | ICD-10-CM

## 2018-02-28 DIAGNOSIS — R918 Other nonspecific abnormal finding of lung field: Secondary | ICD-10-CM

## 2018-02-28 DIAGNOSIS — Z833 Family history of diabetes mellitus: Secondary | ICD-10-CM | POA: Diagnosis not present

## 2018-02-28 DIAGNOSIS — I509 Heart failure, unspecified: Secondary | ICD-10-CM | POA: Diagnosis present

## 2018-02-28 NOTE — Progress Notes (Signed)
Patient ID: Vanessa Carlson, female    DOB: 08-Oct-1960, 57 y.o.   MRN: [id number]  HPI  [name] is a 57 y/o female with a history of cervical cancer, lung cancer, atrial fibrillation, CAD, HTN, MI, AICD and chronic heart failure.   Echo report from 01/05/18 reviewed and showed an EF of 20-25% along with moderate TR, mild MS and a mildly elevated PA pressure of 43 mm Hg. Reviewed echo report done on 11/20/15 which showed an EF of 20-25%.  Has not been admitted or been in the ED in the last 6 months.  She presents today for her follow-up visit with a chief complaint of minimal shortness of breath upon moderate exertion. She describes this as chronic in nature having been present for several years with varying levels of severity. She has associated fatigue, swelling around ankles, palpitations, right shoulder pain and gradual weight gain along with this. She denies cough, chest tightness, chest pain, abdominal distention, dizziness or difficulty sleeping along with this. Has recently finished chemotherapy for lung cancer.   Past Medical History:  Diagnosis Date  . AICD (automatic cardioverter/defibrillator) present   . Anterior myocardial infarction Vanessa Carlson)    a. 10/2014 - occluded LAD, complicated by cardiogenic shock-->Med Rx as interventional team was unable to open LAD.  Vanessa Carlson Cervical cancer (Vanessa Carlson)    in remission for ~20 yrs, s/p hysterectomy  . Chronic combined systolic and diastolic CHF (congestive heart failure) (Vanessa Carlson)    a. 10/2015 Echo: EF 20-25%, sev diast dysfxn, mildly reduced RV fxn. Nl MV prosthesis.  . Coronary artery disease    a. 10/2014 Ant STEMI/Cath: LAD 100p ->Med managed as lesion could not be crossed-->complicated by CGS and post-MI pericarditis (Vanessa Carlson).  . Essential hypertension   . Ischemic cardiomyopathy    a. 03/2015 s/p MDT single lead AICD;  b. 10/2015 Echo: EF 20-25%.  . Mass of upper lobe of right lung    a. noted on CXR & CT 08/2017 w/ abnl PET CT.  Vanessa Carlson Mitral valve  disease    a. 2006 s/p MVR w/ 49mm SJM bileaflet mechanical valve-->chronic coumadin/ASA.   Past Surgical History:  Procedure Laterality Date  . APPENDECTOMY  1998  . CARDIAC CATHETERIZATION    . CARDIAC DEFIBRILLATOR PLACEMENT    . RADICAL HYSTERECTOMY  1998  . SHOULDER SURGERY Bilateral    rotator cuff tears  . VALVE REPLACEMENT     Mitral valve; 27 mm St. Vanessa Carlson bileaflet valve   Family History  Problem Relation Age of Onset  . Heart attack Mother   . Hypertension Mother   . Diabetes Mother   . Emphysema Father        smoker  . Diabetes Maternal Grandmother   . Kidney disease Maternal Grandmother   . Breast cancer Maternal Aunt   . Breast cancer Maternal Aunt   . Colon cancer Maternal Uncle    Social History   Tobacco Use  . Smoking status: Former Smoker    Packs/day: 1.00    Years: 4.00    Pack years: 4.00    Types: Cigarettes    Last attempt to quit: 1998    Years since quitting: 21.5  . Smokeless tobacco: Never Used  Substance Use Topics  . Alcohol use: No   No Known Allergies  Prior to Admission medications   Medication Sig Start Date End Date Taking? Authorizing Provider  aspirin 81 MG chewable tablet Chew 81 mg by mouth daily.   Yes [provider]  atorvastatin (LIPITOR) 80 MG tablet Take 1 tablet (80 mg total) by mouth daily. 02/13/18  Yes Bacigalupo, Dionne Bucy, MD  ENTRESTO 24-26 MG TAKE 1 TABLET BY MOUTH TWICE DAILY 10/16/17  Yes Darylene Price A, FNP  fluticasone (FLONASE) 50 MCG/ACT nasal spray Place 2 sprays into both nostrils as needed for allergies or rhinitis.   Yes [provider]  furosemide (LASIX) 40 MG tablet TAKE 1 TABLET BY MOUTH THREE TIMES DAILY 06/15/17  Yes Darylene Price A, FNP  metoprolol succinate (TOPROL-XL) 50 MG 24 hr tablet Take 1/2 tablet (25 mg) by mouth two times a day. Take with or immediately following a meal. 04/11/17  Yes End, Harrell Gave, MD  nitroGLYCERIN (NITROSTAT) 0.3 MG SL tablet Place 1 tablet (0.3 mg  total) under the tongue every 5 (five) minutes as needed for chest pain. 02/08/17  Yes Darylene Price A, FNP  spironolactone (ALDACTONE) 25 MG tablet Take 1 tablet (25 mg total) by mouth daily. 08/11/17  Yes Bacigalupo, Dionne Bucy, MD  traMADol (ULTRAM) 50 MG tablet Take 1 tablet (50 mg total) every 6 (six) hours as needed by mouth. 07/08/17  Yes Bacigalupo, Dionne Bucy, MD  traZODone (DESYREL) 50 MG tablet Take 0.5-1 tablets (25-50 mg total) by mouth at bedtime as needed for sleep. 12/07/17  Yes Bacigalupo, Dionne Bucy, MD  warfarin (COUMADIN) 1 MG tablet Take 1 tablet (1 mg total) by mouth as directed. 08/10/17  Yes Vanessa Crews, MD  warfarin (COUMADIN) 2.5 MG tablet Take 8.5 mg PO daily on Mon, Wed, Fri, and 7.5 mg PO daily on Tues, Thurs, Sat, Sun 08/10/17  Yes Vanessa Crews, MD  warfarin (COUMADIN) 7.5 MG tablet Take 8.5 mg PO daily on Mon, Wed, Fri, and 7.5 mg PO daily on Tues, Thurs, Sat, Sun 02/22/18  Yes Mar Daring, PA-C    Review of Systems  Constitutional: Positive for fatigue. Negative for appetite change.  HENT: Negative for congestion, postnasal drip and sore throat.   Eyes: Negative.   Respiratory: Positive for shortness of breath. Negative for cough and chest tightness.   Cardiovascular: Positive for palpitations and leg swelling (trace swelling around ankles). Negative for chest pain.  Gastrointestinal: Negative for abdominal distention and abdominal pain.  Endocrine: Negative.   Genitourinary: Negative.   Musculoskeletal: Positive for arthralgias (right shoulder (bone spur/arthritis)). Negative for back pain and neck pain.  Skin: Negative.   Allergic/Immunologic: Negative.   Neurological: Negative for dizziness and light-headedness.  Hematological: Negative for adenopathy. Does not bruise/bleed easily.  Psychiatric/Behavioral: Negative for dysphoric mood and sleep disturbance (sleeping on 2 pillows). The patient is not nervous/anxious.    Vitals:   02/28/18  0931  BP: 135/71  Pulse: 82  Resp: 18  SpO2: 100%  Weight: 224 lb 6 oz (101.8 kg)  Height: 5\' 2"  (1.575 m)   Wt Readings from Last 3 Encounters:  02/28/18 224 lb 6 oz (101.8 kg)  12/21/17 223 lb (101.2 kg)  12/15/17 219 lb 5.7 oz (99.5 kg)   Lab Results  Component Value Date   CREATININE 0.91 12/21/2017   CREATININE 1.06 (H) 08/31/2017   CREATININE 1.14 (H) 07/08/2017    Physical Exam  Constitutional: She is oriented to person, place, and time. She appears well-developed and well-nourished.  HENT:  Head: Normocephalic and atraumatic.  Neck: Normal range of motion. Neck supple. No JVD present.  Cardiovascular: Normal rate and regular rhythm.  Pulmonary/Chest: Effort normal. She has no wheezes. She has no rales.  Abdominal: Soft. She  exhibits no distension.  Musculoskeletal: She exhibits edema (trace edema). She exhibits no tenderness.  Neurological: She is alert and oriented to person, place, and time.  Skin: Skin is warm and dry.  Psychiatric: She has a normal mood and affect. Her behavior is normal. Thought content normal.  Nursing note and vitals reviewed.  Assessment & Plan:  1: Chronic heart failure with reduced ejection fraction- - NYHA class II - euvolemic today - not adding salt to food. Reminded to follow a 2000 mg sodium diet  - weighing daily. Instructed to call for an overnight weight gain of >2 pounds or a weekly weight gain of >5  - weight up 11 pounds since she was here 6 months ago; says that she's gradually gained weight as she was dealing with radiation related to her lung cancer - discussed increasing metoprolol but she says that when that was previously tried, she had abdominal pain. Could consider changing beta-blocker to carvedilol in the future - taking furosemide 40mg  AM/ 80mg  PM - taking spironolactone 25 mg daily  - saw cardiology (End) 12/21/17 - PharmD reconciled medications with the patient  2: HTN- - BP looks good today; although has been  hypotensive in the past - should BP continue to be ok, consider titrating up entresto  - saw PCP Brita Romp) 12/07/17 - BMP from 12/21/17 reviewed and showed sodium 143, potassium 3.9 and GFR 81  3: Atrial fibrillation- - heart rate controlled at this time - AICD present but denies shocks - continue warfarin and metoprolol succinate  - follows with EP Caryl Comes) and was last seen by him 12/07/16 - INR 02/22/18 was 3.4  4: Lung cancer- - has had radiation treatments which ended early May 2019 - returning for chest CT in a few weeks  Patient did not bring her medications nor a list. Each medication was verbally reviewed with the patient and she was encouraged to bring the bottles to every visit to confirm accuracy of list.    Return in 3 months or sooner for any questions/problems before then.

## 2018-02-28 NOTE — Patient Instructions (Addendum)
Continue weighing daily and call for an overnight weight gain of > 2 pounds or a weekly weight gain of >5 pounds.  Consider changing metoprolol to carvedilol as increased metoprolol dose previously caused abdominal pain

## 2018-03-01 ENCOUNTER — Encounter: Payer: Self-pay | Admitting: Family

## 2018-03-08 ENCOUNTER — Ambulatory Visit (INDEPENDENT_AMBULATORY_CARE_PROVIDER_SITE_OTHER): Payer: Medicare Other | Admitting: Family Medicine

## 2018-03-08 ENCOUNTER — Encounter: Payer: Self-pay | Admitting: Family Medicine

## 2018-03-08 VITALS — BP 100/62 | HR 62 | Temp 98.3°F | Resp 16 | Ht 63.0 in | Wt 226.0 lb

## 2018-03-08 DIAGNOSIS — Z1239 Encounter for other screening for malignant neoplasm of breast: Secondary | ICD-10-CM

## 2018-03-08 DIAGNOSIS — Z1272 Encounter for screening for malignant neoplasm of vagina: Secondary | ICD-10-CM | POA: Diagnosis not present

## 2018-03-08 DIAGNOSIS — Z0000 Encounter for general adult medical examination without abnormal findings: Secondary | ICD-10-CM

## 2018-03-08 DIAGNOSIS — Z1231 Encounter for screening mammogram for malignant neoplasm of breast: Secondary | ICD-10-CM

## 2018-03-08 DIAGNOSIS — Z8541 Personal history of malignant neoplasm of cervix uteri: Secondary | ICD-10-CM

## 2018-03-08 NOTE — Patient Instructions (Signed)
Preventive Care 40-64 Years, Female Preventive care refers to lifestyle choices and visits with your health care provider that can promote health and wellness. What does preventive care include?  A yearly physical exam. This is also called an annual well check.  Dental exams once or twice a year.  Routine eye exams. Ask your health care provider how often you should have your eyes checked.  Personal lifestyle choices, including: ? Daily care of your teeth and gums. ? Regular physical activity. ? Eating a healthy diet. ? Avoiding tobacco and drug use. ? Limiting alcohol use. ? Practicing safe sex. ? Taking low-dose aspirin daily starting at age 52. ? Taking vitamin and mineral supplements as recommended by your health care provider. What happens during an annual well check? The services and screenings done by your health care provider during your annual well check will depend on your age, overall health, lifestyle risk factors, and family history of disease. Counseling Your health care provider may ask you questions about your:  Alcohol use.  Tobacco use.  Drug use.  Emotional well-being.  Home and relationship well-being.  Sexual activity.  Eating habits.  Work and work Statistician.  Method of birth control.  Menstrual cycle.  Pregnancy history.  Screening You may have the following tests or measurements:  Height, weight, and BMI.  Blood pressure.  Lipid and cholesterol levels. These may be checked every 5 years, or more frequently if you are over 50 years old.  Skin check.  Lung cancer screening. You may have this screening every year starting at age 46 if you have a 30-pack-year history of smoking and currently smoke or have quit within the past 15 years.  Fecal occult blood test (FOBT) of the stool. You may have this test every year starting at age 41.  Flexible sigmoidoscopy or colonoscopy. You may have a sigmoidoscopy every 5 years or a colonoscopy  every 10 years starting at age 79.  Hepatitis C blood test.  Hepatitis B blood test.  Sexually transmitted disease (STD) testing.  Diabetes screening. This is done by checking your blood sugar (glucose) after you have not eaten for a while (fasting). You may have this done every 1-3 years.  Mammogram. This may be done every 1-2 years. Talk to your health care provider about when you should start having regular mammograms. This may depend on whether you have a family history of breast cancer.  BRCA-related cancer screening. This may be done if you have a family history of breast, ovarian, tubal, or peritoneal cancers.  Pelvic exam and Pap test. This may be done every 3 years starting at age 58. Starting at age 60, this may be done every 5 years if you have a Pap test in combination with an HPV test.  Bone density scan. This is done to screen for osteoporosis. You may have this scan if you are at high risk for osteoporosis.  Discuss your test results, treatment options, and if necessary, the need for more tests with your health care provider. Vaccines Your health care provider may recommend certain vaccines, such as:  Influenza vaccine. This is recommended every year.  Tetanus, diphtheria, and acellular pertussis (Tdap, Td) vaccine. You may need a Td booster every 10 years.  Varicella vaccine. You may need this if you have not been vaccinated.  Zoster vaccine. You may need this after age 34.  Measles, mumps, and rubella (MMR) vaccine. You may need at least one dose of MMR if you were born in  1957 or later. You may also need a second dose.  Pneumococcal 13-valent conjugate (PCV13) vaccine. You may need this if you have certain conditions and were not previously vaccinated.  Pneumococcal polysaccharide (PPSV23) vaccine. You may need one or two doses if you smoke cigarettes or if you have certain conditions.  Meningococcal vaccine. You may need this if you have certain  conditions.  Hepatitis A vaccine. You may need this if you have certain conditions or if you travel or work in places where you may be exposed to hepatitis A.  Hepatitis B vaccine. You may need this if you have certain conditions or if you travel or work in places where you may be exposed to hepatitis B.  Haemophilus influenzae type b (Hib) vaccine. You may need this if you have certain conditions.  Talk to your health care provider about which screenings and vaccines you need and how often you need them. This information is not intended to replace advice given to you by your health care provider. Make sure you discuss any questions you have with your health care provider. Document Released: 09/12/2015 Document Revised: 05/05/2016 Document Reviewed: 06/17/2015 Elsevier Interactive Patient Education  [employer].

## 2018-03-08 NOTE — Progress Notes (Signed)
Patient: Vanessa Carlson, Female    DOB: 08-04-1961, 57 y.o.   MRN: [id number] Visit Date: 03/08/2018  Today's Provider: Lavon Paganini, MD   I, Martha Clan, CMA, am acting as scribe for Lavon Paganini, MD.  Chief Complaint  Patient presents with  . Medicare Wellness   Subjective:     Complete Physical Vanessa Carlson is a 57 y.o. female. She feels well. She reports exercising some. She reports she is sleeping well.  She denies other concerns.  Is following with Onc and RadOnc for lung cancer.  Pt had a "radical hysterectomy" about 25 years ago due to cervical cancer. She believes that last vaginal pap smear was done ~3 years ago.  Will request records.  Pt states she had a colonoscopy about 3 years ago in [place] ([place]).  Will request records.  She states her last mammogram was about 3 years ago. Agrees to update this. -----------------------------------------------------------   Review of Systems  Constitutional: Negative.   HENT: Negative.   Eyes: Negative.   Respiratory: Positive for shortness of breath. Negative for apnea, cough, choking, chest tightness, wheezing and stridor.   Cardiovascular: Positive for palpitations and leg swelling. Negative for chest pain.  Gastrointestinal: Negative.   Endocrine: Negative.   Genitourinary: Positive for urgency. Negative for decreased urine volume, difficulty urinating, dyspareunia, dysuria, enuresis, flank pain, frequency, genital sores, hematuria, menstrual problem, pelvic pain, vaginal bleeding, vaginal discharge and vaginal pain.  Musculoskeletal: Positive for back pain. Negative for arthralgias, gait problem, joint swelling, myalgias, neck pain and neck stiffness.  Skin: Negative.   Allergic/Immunologic: Negative.   Neurological: Negative.   Hematological: Negative.   Psychiatric/Behavioral: Negative.     Social History   Socioeconomic History  . Marital status: Married   Spouse name: Tosin  . Number of children: 2  . Years of education: 34  . 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 Financial resource strain: Not on file  . Food insecurity:    Worry: Not on file    Inability: Not on file  . Transportation needs:    Medical: Not on file    Non-medical: Not on file  Tobacco Use  . Smoking status: Former Smoker    Packs/day: 1.00    Years: 4.00    Pack years: 4.00    Types: Cigarettes    Last attempt to quit: 1998    Years since quitting: 21.5  . Smokeless tobacco: Never Used  Substance and Sexual Activity  . Alcohol use: No  . Drug use: No  . Sexual activity: Yes    Partners: Male    Birth control/protection: Surgical  Lifestyle  . Physical activity:    Days per week: Not on file    Minutes per session: Not on file  . Stress: Not on file  Relationships  . Social connections:    Talks on phone: Not on file    Gets together: Not on file    Attends religious service: Not on file    Active member of club or organization: Not on file    Attends meetings of clubs or organizations: Not on file    Relationship status: Not on file  . Intimate partner violence:    Fear of current or ex partner: Not on file    Emotionally abused: Not on file    Physically abused: Not on file    Forced sexual  activity: Not on file  Other Topics Concern  . Not on file  Social History Narrative  . Not on file    Past Medical History:  Diagnosis Date  . AICD (automatic cardioverter/defibrillator) present   . Anterior myocardial infarction [facility])    a. 10/2014 - occluded LAD, complicated by cardiogenic shock-->Med Rx as interventional team was unable to open LAD.  [name] Cervical cancer ([place])    in remission for ~20 yrs, s/p hysterectomy  . Chronic combined systolic and diastolic CHF (congestive heart failure) ([place])    a. 10/2015 Echo: EF 20-25%, sev diast dysfxn, mildly reduced RV fxn.  Nl MV prosthesis.  . Coronary artery disease    a. 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  . Essential hypertension   . Ischemic cardiomyopathy    a. 03/2015 s/p MDT single lead AICD;  b. 10/2015 Echo: EF 20-25%.  . Lung cancer ([place]) 11/2017   radiation therapy right side  . Mass of upper lobe of right lung    a. noted on CXR & CT 08/2017 w/ abnl PET CT.  [name] Mitral valve disease    a. 2006 s/p MVR w/ 24mm SJM bileaflet mechanical valve-->chronic coumadin/ASA.     Patient Active Problem List   Diagnosis Date Noted  . Morbid obesity ([place]) 12/22/2017  . Insomnia 12/07/2017  . Mass of upper lobe of right lung 09/06/2017  . Chest wall pain 08/31/2017  . Right shoulder pain 07/12/2017  . OA (osteoarthritis) of knee 07/08/2017  . Chronic anticoagulation 07/08/2017  . Paroxysmal atrial fibrillation ([facility])   . Coronary artery disease 02/24/2017  . Ischemic cardiomyopathy 02/24/2017  . Rheumatic heart disease 02/24/2017  . Status post mitral valve replacement 02/24/2017  . Hypokalemia 01/28/2017  . Chronic systolic heart failure ([place]) 01/02/2017  . HTN (hypertension) 01/02/2017    Past Surgical History:  Procedure Laterality Date  . APPENDECTOMY  1998  . CARDIAC CATHETERIZATION    . CARDIAC DEFIBRILLATOR PLACEMENT    . RADICAL HYSTERECTOMY  1998  . SHOULDER SURGERY Bilateral    rotator cuff tears  . VALVE REPLACEMENT     Mitral valve; 27 mm St. Jude bileaflet valve    Her family history includes Breast cancer in her maternal aunt and maternal aunt; Colon cancer in her maternal uncle; Diabetes in her maternal grandmother and mother; Emphysema in her father; Heart attack in her mother; Hypertension in her mother; Kidney disease in her maternal grandmother.      Current Outpatient Medications:  .  aspirin 81 MG chewable tablet, Chew 81 mg by mouth daily., Disp: , Rfl:  .  atorvastatin (LIPITOR)  80 MG tablet, Take 1 tablet (80 mg total) by mouth daily., Disp: 90 tablet, Rfl: 1 .  ENTRESTO 24-26 MG, TAKE 1 TABLET BY MOUTH TWICE DAILY, Disp: 60 tablet, Rfl: 5 .  fluticasone (FLONASE) 50 MCG/ACT nasal spray, Place 2 sprays into both nostrils as needed for allergies or rhinitis., Disp: , Rfl:  .  furosemide (LASIX) 40 MG tablet, TAKE 1 TABLET BY MOUTH THREE TIMES DAILY, Disp: 270 tablet, Rfl: 3 .  metoprolol succinate (TOPROL-XL) 50 MG 24 hr tablet, Take 1/2 tablet (25 mg) by mouth two times a day. Take with or immediately following a meal., Disp: 90 tablet, Rfl: 3 .  nitroGLYCERIN (NITROSTAT) 0.3 MG SL tablet, Place 1 tablet (0.3 mg total) under the tongue every 5 (five) minutes as needed for chest  pain., Disp: 30 tablet, Rfl: 3 .  spironolactone (ALDACTONE) 25 MG tablet, Take 1 tablet (25 mg total) by mouth daily., Disp: 90 tablet, Rfl: 3 .  traMADol (ULTRAM) 50 MG tablet, Take 1 tablet (50 mg total) every 6 (six) hours as needed by mouth., Disp: 30 tablet, Rfl: 5 .  traZODone (DESYREL) 50 MG tablet, Take 0.5-1 tablets (25-50 mg total) by mouth at bedtime as needed for sleep., Disp: 30 tablet, Rfl: 3 .  warfarin (COUMADIN) 1 MG tablet, Take 1 tablet (1 mg total) by mouth as directed., Disp: 30 tablet, Rfl: 2 .  warfarin (COUMADIN) 2.5 MG tablet, Take 8.5 mg PO daily on Mon, Wed, Fri, and 7.5 mg PO daily on Tues, Thurs, Sat, Sun, Disp: 90 tablet, Rfl: 1 .  warfarin (COUMADIN) 7.5 MG tablet, Take 8.5 mg PO daily on Mon, Wed, Fri, and 7.5 mg PO daily on Tues, Thurs, Sat, Sun, Disp: 90 tablet, Rfl: 1  Patient Care Team: Virginia Crews, MD as PCP - General (Family Medicine) End, Harrell Gave, MD as PCP - Cardiology (Cardiology) Telford Nab, RN as Registered Nurse Nestor Lewandowsky, MD as Referring Physician (Cardiothoracic Surgery) Schuyler Amor, MD as Attending Physician (Emergency Medicine)     Objective:   Vitals: BP 100/62 (BP Location: Left Arm, Patient Position: Sitting, Cuff  Size: Large)   Pulse 62   Temp 98.3 F (36.8 C) (Oral)   Resp 16   Ht 5\' 3"  (1.6 m)   Wt 226 lb (102.5 kg)   LMP  (LMP Unknown)   SpO2 98%   BMI 40.03 kg/m   Physical Exam  Constitutional: She is oriented to person, place, and time. She appears well-developed and well-nourished. No distress.  HENT:  Head: Normocephalic and atraumatic.  Right Ear: External ear normal.  Left Ear: External ear normal.  Nose: Nose normal.  Mouth/Throat: Oropharynx is clear and moist.  Eyes: Pupils are equal, round, and reactive to light. Conjunctivae and EOM are normal. No scleral icterus.  Neck: Neck supple. No thyromegaly present.  Cardiovascular: Normal rate, regular rhythm and intact distal pulses.  Murmur heard. Pulmonary/Chest: Effort normal and breath sounds normal. No respiratory distress. She has no wheezes. She has no rales.  Abdominal: Soft. Bowel sounds are normal. She exhibits no distension. There is no tenderness. There is no rebound and no guarding.  Genitourinary:  Genitourinary Comments: Breasts: breasts appear normal, no suspicious masses, no skin or nipple changes or axillary nodes.  GYN:  External genitalia within normal limits.  Vaginal mucosa pink, moist, normal rugae.  Vaginal cuff without lesions, no discharge or bleeding noted on speculum exam.    Musculoskeletal: She exhibits edema (1+ bilateral ankles). She exhibits no deformity.  Lymphadenopathy:    She has no cervical adenopathy.  Neurological: She is alert and oriented to person, place, and time.  Skin: Skin is warm and dry. Capillary refill takes less than 2 seconds. No rash noted.  Psychiatric: She has a normal mood and affect. Her behavior is normal.  Vitals reviewed.   Activities of Daily Living In your present state of health, do you have any difficulty performing the following activities: 03/08/2018 02/28/2018  Hearing? N N  Vision? N N  Difficulty concentrating or making decisions? N N  Walking or climbing  stairs? Y Y  Dressing or bathing? N N  Doing errands, shopping? N N  Some recent data might be hidden    Fall Risk Assessment Fall Risk  03/08/2018 02/28/2018 12/15/2017 09/06/2017 06/06/2017  Falls in the past year? No No No Yes No  Number falls in past yr: - - - 1 -  Injury with Fall? - - - Yes -  Follow up - - - Falls prevention discussed -     Depression Screen PHQ 2/9 Scores 03/08/2018 02/28/2018 12/15/2017 09/06/2017  PHQ - 2 Score 0 0 0 0  PHQ- 9 Score - - - -   6CIT Screen 03/08/2018  What Year? 0 points  What month? 0 points  What time? 0 points  Count back from 20 0 points  Months in reverse 0 points  Repeat phrase 2 points  Total Score 2      Assessment & Plan:    Annual Physical Reviewed patient's Family Medical History Reviewed and updated list of patient's medical providers Assessment of cognitive impairment was done Assessed patient's functional ability Established a written schedule for health screening [place] Completed and Reviewed  Exercise Activities and Dietary recommendations Goals    None      Immunization History  Administered Date(s) Administered  . Influenza Split 05/17/2016  . Influenza,inj,Quad PF,6+ Mos 06/20/2015, 07/08/2017  . Pneumococcal Polysaccharide-23 12/03/2008    Health Maintenance  Topic Date Due  . HIV Screening  12/30/1974  . TETANUS/TDAP  12/30/1978  . PAP SMEAR  12/29/1980  . MAMMOGRAM  12/29/2009  . COLONOSCOPY  12/29/2009  . INFLUENZA VACCINE  03/30/2018  . Hepatitis C Screening  Completed     Discussed health benefits of physical activity, and encouraged her to engage in regular exercise appropriate for her age and condition.    ------------------------------------------------------------------------------------------------------------  Problem List Items Addressed This Visit    None    Visit Diagnoses    Encounter for Medicare annual wellness exam    -  Primary   History of cervical  cancer       Relevant Orders   Pap IG and HPV (high risk) DNA detection   Screening for vaginal cancer       Relevant Orders   Pap IG and HPV (high risk) DNA detection   Screening for breast cancer       Relevant Orders   MS DIGITAL SCREENING TOMO BILATERAL       Return in about 6 months (around 09/08/2018) for chronic disease f/u.   The entirety of the information documented in the History of Present Illness, Review of Systems and Physical Exam were personally obtained by me. Portions of this information were initially documented by Raquel Sarna Ratchford, CMA and reviewed by me for thoroughness and accuracy.    Virginia Crews, MD, MPH [facility] 03/08/2018 11:35 AM

## 2018-03-10 ENCOUNTER — Telehealth: Payer: Self-pay

## 2018-03-10 LAB — PAP IG AND HPV HIGH-RISK
HPV, high-risk: NEGATIVE
PAP Smear Comment: 0

## 2018-03-10 NOTE — Telephone Encounter (Signed)
-----   Message from Virginia Crews, MD sent at 03/10/2018  2:13 PM EDT ----- Normal Pap smear.  HPV negative.

## 2018-03-10 NOTE — Telephone Encounter (Signed)
Left message advising pt. OK per DPR. 

## 2018-03-15 ENCOUNTER — Ambulatory Visit (INDEPENDENT_AMBULATORY_CARE_PROVIDER_SITE_OTHER): Payer: Medicare Other | Admitting: Family Medicine

## 2018-03-15 ENCOUNTER — Ambulatory Visit: Payer: Medicare Other

## 2018-03-15 ENCOUNTER — Ambulatory Visit: Payer: Medicare Other | Admitting: Physician Assistant

## 2018-03-15 DIAGNOSIS — Z952 Presence of prosthetic heart valve: Secondary | ICD-10-CM | POA: Diagnosis not present

## 2018-03-15 DIAGNOSIS — I255 Ischemic cardiomyopathy: Secondary | ICD-10-CM

## 2018-03-15 LAB — POCT INR
INR: 2.6 (ref 2.0–3.0)
PT: 31

## 2018-03-15 NOTE — Patient Instructions (Signed)
Continue current dose of 7.5mg  daily excpet 8.5mg  Tues, Thurs, Saturday.  Recheck in 3 weeks.

## 2018-03-22 ENCOUNTER — Ambulatory Visit (INDEPENDENT_AMBULATORY_CARE_PROVIDER_SITE_OTHER): Payer: Medicare Other | Admitting: Internal Medicine

## 2018-03-22 ENCOUNTER — Other Ambulatory Visit
Admission: RE | Admit: 2018-03-22 | Discharge: 2018-03-22 | Disposition: A | Discharge status: Home / Self Care | Payer: Medicare Other | Source: Ambulatory Visit | Attending: Internal Medicine | Admitting: Internal Medicine

## 2018-03-22 ENCOUNTER — Encounter: Payer: Self-pay | Admitting: Internal Medicine

## 2018-03-22 ENCOUNTER — Ambulatory Visit
Admission: RE | Admit: 2018-03-22 | Discharge: 2018-03-22 | Disposition: A | Discharge status: Home / Self Care | Payer: Medicare Other | Source: Ambulatory Visit | Attending: Radiation Oncology | Admitting: Radiation Oncology

## 2018-03-22 VITALS — BP 92/60 | HR 72 | Ht 62.0 in | Wt 224.8 lb

## 2018-03-22 DIAGNOSIS — I517 Cardiomegaly: Secondary | ICD-10-CM | POA: Insufficient documentation

## 2018-03-22 DIAGNOSIS — C3411 Malignant neoplasm of upper lobe, right bronchus or lung: Secondary | ICD-10-CM

## 2018-03-22 DIAGNOSIS — J432 Centrilobular emphysema: Secondary | ICD-10-CM | POA: Diagnosis not present

## 2018-03-22 DIAGNOSIS — I5022 Chronic systolic (congestive) heart failure: Secondary | ICD-10-CM

## 2018-03-22 DIAGNOSIS — I700 Atherosclerosis of aorta: Secondary | ICD-10-CM | POA: Insufficient documentation

## 2018-03-22 DIAGNOSIS — I2510 Atherosclerotic heart disease of native coronary artery without angina pectoris: Secondary | ICD-10-CM | POA: Diagnosis not present

## 2018-03-22 DIAGNOSIS — I255 Ischemic cardiomyopathy: Secondary | ICD-10-CM

## 2018-03-22 DIAGNOSIS — E785 Hyperlipidemia, unspecified: Secondary | ICD-10-CM | POA: Insufficient documentation

## 2018-03-22 DIAGNOSIS — R918 Other nonspecific abnormal finding of lung field: Secondary | ICD-10-CM | POA: Diagnosis not present

## 2018-03-22 HISTORY — DX: Malignant neoplasm of upper lobe, right bronchus or lung: C34.11

## 2018-03-22 LAB — BASIC METABOLIC PANEL
Anion gap: 7 (ref 5–15)
BUN: 14 mg/dL (ref 6–20)
CALCIUM: 9.2 mg/dL (ref 8.9–10.3)
CO2: 28 mmol/L (ref 22–32)
Chloride: 106 mmol/L (ref 98–111)
Creatinine, Ser: 0.89 mg/dL (ref 0.44–1.00)
GFR calc Af Amer: >60 mL/min (ref >60)
Glucose, Bld: 104 mg/dL — ABNORMAL HIGH (ref 70–99)
Potassium: 3.8 mmol/L (ref 3.5–5.1)
SODIUM: 141 mmol/L (ref 135–145)

## 2018-03-22 LAB — POCT I-STAT CREATININE: CREATININE: 0.9 mg/dL (ref 0.44–1.00)

## 2018-03-22 MED ORDER — IOPAMIDOL (ISOVUE-300) INJECTION 61%
75.0000 mL | Freq: Once | INTRAVENOUS | Status: AC | PRN
  Administered 2018-03-22: 75 mL via INTRAVENOUS

## 2018-03-22 NOTE — Progress Notes (Signed)
Follow-up Outpatient Visit Date: 03/22/2018  Primary Care Provider: Virginia Crews, MD [address] [place] [id number]  Chief Complaint: Follow up CAD, heart failure, and valvular heart disease  HPI:  [name] is a 57 y.o. 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  I last saw her in April, at she had just completed radiation therapy for stage I lung cancer.  She noted shortness of breath while receiving XRT, though this improved after completion of the treatments.  Given 7 pound weight gain over the preceding 2 months as well as mild leg edema, we opted to obtain a transthoracic echocardiogram.  This confirmed severely reduced LV systolic function with an EF of 20 to 25%.  Mechanical mitral valve with a mean gradient of 4 mmHg was noted.  Pulmonary artery pressures were mildly to moderately elevated at 40 to 45 mmHg.  Today, [name] reports feeling well.  She notes occasional ankle edema but has actually lost a little bit of weight.  She is trying to exercise some.  She denies chest pain, shortness of breath, palpitations, lightheadedness, orthopnea, PND, and bleeding.  She remains compliant with ASA and warfarin.  She has not had any focal neurologic changes.  --------------------------------------------------------------------------------------------------  Cardiovascular History & Procedures: Cardiovascular Problems:  Coronary artery disease status post anterior STEMI (2016)  Ischemic cardiomyopathy with chronic systolic heart failure  Rheumatic heart disease status post mechanical mitral valve  replacement (2006)  Risk Factors:  Known coronary artery disease and hypertension  Cath/PCI:  LHC (2016, [place]): Reportedly proximal occlusion of the LAD, which could not be crossed with a wire.  CV Surgery:  Mitral valve replacement (2006, [facility]): 27 mm St. Jude bileaflet valve  EP Procedures and Devices:  ICD (04/01/15, [facility]): Medtronic single-chamber ICD  Non-Invasive Evaluation(s):  TTE (01/05/18): Mildly dilated LV.  LVEF 20 to 25% with diffuse hypokinesis and severe hypokinesis of the anterior and anteroseptal myocardium.  Apical akinesis.  Mechanical mitral valve with mean gradient of 4 mmHg.  Mild left atrial enlargement.  Normal RV function.  Moderate TR.  PASP 43 mmHg.  TTE (11/20/15): Normal LV size and wall thickness. LVEF 20-25% with severe diastolic dysfunction. Mildly reduced RV contraction. Normal MV prosthesis function.   Recent CV Pertinent Labs: Lab Results  Component Value Date   CHOL 133 07/08/2017   HDL 47 (L) 07/08/2017   LDLCALC 66 07/08/2017   TRIG 121 07/08/2017   CHOLHDL 2.8 07/08/2017   INR 2.6 03/15/2018   INR 1.10 09/23/2017   K 3.8 03/22/2018   BUN 14 03/22/2018   BUN 9 12/21/2017   CREATININE 0.89 03/22/2018   CREATININE 1.14 (H) 07/08/2017    Past medical and surgical history were reviewed and updated in EPIC.  Current Meds  Medication Sig  . aspirin 81 MG chewable tablet Chew 81 mg by mouth daily.  [name] atorvastatin (LIPITOR) 80 MG tablet Take 1 tablet (80 mg total) by mouth daily.  [name] ENTRESTO 24-26 MG TAKE 1 TABLET BY MOUTH TWICE DAILY  . fluticasone (FLONASE) 50 MCG/ACT nasal spray Place 2 sprays into both nostrils as needed for allergies or rhinitis.  . furosemide (LASIX) 40 MG tablet TAKE 1 TABLET BY MOUTH  THREE TIMES DAILY  . metoprolol succinate (TOPROL-XL) 50 MG 24 hr tablet Take 1/2 tablet (25 mg) by mouth two times a day. Take with or immediately following a meal.  . nitroGLYCERIN (NITROSTAT) 0.3 MG SL tablet Place 1 tablet  (0.3 mg total) under the tongue every 5 (five) minutes as needed for chest pain.  [name] spironolactone (ALDACTONE) 25 MG tablet Take 1 tablet (25 mg total) by mouth daily.  . traMADol (ULTRAM) 50 MG tablet Take 1 tablet (50 mg total) every 6 (six) hours as needed by mouth.  . traZODone (DESYREL) 50 MG tablet Take 0.5-1 tablets (25-50 mg total) by mouth at bedtime as needed for sleep.  [name] warfarin (COUMADIN) 1 MG tablet Take 1 tablet (1 mg total) by mouth as directed.  . warfarin (COUMADIN) 2.5 MG tablet Take 8.5 mg PO daily on Mon, Wed, Fri, and 7.5 mg PO daily on Tues, Thurs, Sat, Sun  . warfarin (COUMADIN) 7.5 MG tablet Take 8.5 mg PO daily on Mon, Wed, Fri, and 7.5 mg PO daily on Tues, Thurs, Sat, Sun    Allergies: Patient has no known allergies.  Social History   Tobacco Use  . Smoking status: Former Smoker    Packs/day: 1.00    Years: 4.00    Pack years: 4.00    Types: Cigarettes    Last attempt to quit: 1998    Years since quitting: 21.5  . Smokeless tobacco: Never Used  Substance Use Topics  . Alcohol use: No  . Drug use: No    Family History  Problem Relation Age of Onset  . Heart attack Mother   . Hypertension Mother   . Diabetes Mother   . Emphysema Father        smoker  . Diabetes Maternal Grandmother   . Kidney disease Maternal Grandmother   . Breast cancer Maternal Aunt   . Breast cancer Maternal Aunt   . Colon cancer Maternal Uncle     Review of Systems: A 12-system review of systems was performed and was negative except as noted in the HPI.  --------------------------------------------------------------------------------------------------  Physical Exam: BP 92/60 (BP Location: Left Arm, Patient Position: Sitting, Cuff Size: Large)   Pulse 72   Ht 5\' 2"  (1.575 m)   Wt 224 lb 12 oz (101.9 kg)   LMP  (LMP Unknown)   BMI 41.11 kg/m   General:  NAD. HEENT: No conjunctival pallor or scleral icterus. Moist mucous membranes.  OP clear. Neck: Supple without  lymphadenopathy, thyromegaly, JVD, or HJR, though evaluation is limited by body habitus. Lungs: Normal work of breathing. Clear to auscultation bilaterally without wheezes or crackles. Heart: Regular rate and rhythm without murmurs, rubs, or gallops. Mechanical S1 noted. Unable to assess PMI due to body habitus. Abd: Bowel sounds present. Soft, NT/ND.  Unable to assess HSM due to body habitus. Ext: Trace ankle edema bilaterally. Skin: Warm and dry without rash.  EKG:  NSR with lateral Q-waves and non-specific T-wave changes.  No change since 12/21/17.  Lab Results  Component Value Date   WBC 6.7 09/23/2017   HGB 13.6 09/23/2017   HCT 41.4 09/23/2017   MCV 85.2 09/23/2017   PLT 167 09/23/2017    Lab Results  Component Value Date   NA 141 03/22/2018   K 3.8 03/22/2018   CL 106 03/22/2018   CO2 28 03/22/2018   BUN 14 03/22/2018   CREATININE 0.89 03/22/2018   GLUCOSE 104 (H) 03/22/2018   ALT 12 07/08/2017  Lab Results  Component Value Date   CHOL 133 07/08/2017   HDL 47 (L) 07/08/2017   LDLCALC 66 07/08/2017   TRIG 121 07/08/2017   CHOLHDL 2.8 07/08/2017    --------------------------------------------------------------------------------------------------  ASSESSMENT AND PLAN: Coronary artery disease No chest pain or other symptoms to suggest worsening coronary insufficiency.  Continue current medications for secondary prevention.  Chronic systolic heart failure due to ischemic cardiomyopathy Trace edema noted on exam.  [name] otherwise appears euvolemic and well-compensated with NYHA class II HF symptoms.  Continue current medications.  Soft blood pressure precludes escalation of therapy today.  I will check a BMP to ensure stable renal function and potassium.  Valvular heart disease No symptoms of valve dysfunction.  Appropriate function on echo this spring.  Continue warfarin and ASA.  SBE with dental procedures reinforced.  Hyperlipidemia LDL at goal.   Continue atorvastatin 80 mg daily.  Morbid obesity BMI > 40.  Encouraged continued exercise as well as dietary changes to lose weight.  Follow-up: Return to clinic in 3 months.  [name], MD 03/22/2018 10:01 PM

## 2018-03-22 NOTE — Patient Instructions (Addendum)
Medication Instructions:  Your physician recommends that you continue on your current medications as directed. Please refer to the Current Medication list given to you today.   Labwork: Your physician recommends that you return for lab work in: [place] (BMET). - Please go to the [facility] [place] [facility]. You will check in at the front desk to the right as you walk into the atrium. Valet Parking is offered if needed.   Testing/Procedures: none  Follow-Up: Your physician recommends that you schedule a follow-up appointment in: [facility].  If you need a refill on your cardiac medications before your next appointment, please call your pharmacy.

## 2018-03-28 ENCOUNTER — Telehealth: Payer: Self-pay

## 2018-03-28 ENCOUNTER — Ambulatory Visit (INDEPENDENT_AMBULATORY_CARE_PROVIDER_SITE_OTHER): Payer: Medicare Other

## 2018-03-28 ENCOUNTER — Ambulatory Visit (INDEPENDENT_AMBULATORY_CARE_PROVIDER_SITE_OTHER): Payer: Medicare Other | Admitting: *Deleted

## 2018-03-28 DIAGNOSIS — I5022 Chronic systolic (congestive) heart failure: Secondary | ICD-10-CM

## 2018-03-28 DIAGNOSIS — Z95810 Presence of automatic (implantable) cardiac defibrillator: Secondary | ICD-10-CM | POA: Diagnosis not present

## 2018-03-28 DIAGNOSIS — I429 Cardiomyopathy, unspecified: Secondary | ICD-10-CM

## 2018-03-28 NOTE — Progress Notes (Signed)
Remote ICD transmission.   

## 2018-03-28 NOTE — Progress Notes (Signed)
EPIC Encounter for ICM Monitoring  Patient Name: Vanessa Carlson is a 57 y.o. female Date: 03/28/2018 Primary Care Physican: Virginia Crews, MD Primary Cardiologist: Carlis Abbott, NP HF Electrophysiologist: Faustino Congress Weight: Previous weight  224lbs(at7/24/19office visit)        Attempted call to patient and unable to reach.  Left detailed message, per DPR, regarding transmission.  Transmission reviewed.    Thoracic impedance normal.  Prescribed dosage: Furosemide 40 mg 1 tablet 3 times daily.   Labs: 08/31/2017 Creatinine 1.06, BUN 14, Potassium 3.9, Sodium 139, EGFR 57->60 07/08/2017 Creatinine 1.14, BUN 14, Potassium 4.2, Sodium 140, EGFR 53-62 03/23/2017 Creatinine 0.87, BUN 9, Potassium 3.7, Sodium 138, EGFR >60 02/23/2017 Creatinine 1.04, BUN 13, Potassium 3.8, Sodium 135, EGFR 58->60  02/08/2017 Creatinine 0.87, BUN 8, Potassium 3.2, Sodium 139, EGFR >60  01/28/2017 Creatinine 0.89, BUN 10, Potassium 3.6, Sodium 139, EGFR >60 12/31/2016 Creatinine 0.88, BUN 11, Potassium 3.2, Sodium 142, EGFR >60  Recommendations: Left voice mail with ICM number and encouraged to call if experiencing any fluid symptoms.  Follow-up plan: ICM clinic phone appointment on 04/28/2018.    Copy of ICM check sent to Dr. Caryl Comes.   3 month ICM trend: 03/28/2018    1 Year ICM trend:       [name], RN 03/28/2018 11:53 AM

## 2018-03-28 NOTE — Telephone Encounter (Signed)
Remote ICM transmission received.  Attempted call to patient and left detailed message, per DPR, regarding transmission and next ICM scheduled for 04/28/2018.  Advised to return call for any fluid symptoms or questions.

## 2018-03-29 ENCOUNTER — Ambulatory Visit
Admission: RE | Admit: 2018-03-29 | Discharge: 2018-03-29 | Disposition: A | Discharge status: Home / Self Care | Payer: Medicare Other | Source: Ambulatory Visit | Attending: Radiation Oncology | Admitting: Radiation Oncology

## 2018-03-29 ENCOUNTER — Other Ambulatory Visit: Payer: Self-pay

## 2018-03-29 ENCOUNTER — Encounter: Payer: Self-pay | Admitting: Radiation Oncology

## 2018-03-29 ENCOUNTER — Other Ambulatory Visit: Payer: Self-pay | Admitting: *Deleted

## 2018-03-29 ENCOUNTER — Encounter: Payer: Self-pay | Admitting: Cardiology

## 2018-03-29 VITALS — BP 131/86 | HR 85 | Temp 97.5°F | Resp 18 | Wt 225.2 lb

## 2018-03-29 DIAGNOSIS — C3411 Malignant neoplasm of upper lobe, right bronchus or lung: Secondary | ICD-10-CM | POA: Insufficient documentation

## 2018-03-29 DIAGNOSIS — Z923 Personal history of irradiation: Secondary | ICD-10-CM | POA: Insufficient documentation

## 2018-03-29 DIAGNOSIS — Z87891 Personal history of nicotine dependence: Secondary | ICD-10-CM | POA: Diagnosis not present

## 2018-03-29 NOTE — Progress Notes (Signed)
Radiation Oncology Follow up Note  Name: Vanessa Carlson   Date:   03/29/2018 MRN:  [id number] DOB: [birth year]    This 57 y.o. female presents to the clinic today for four-month follow-up status post SB RT to her right upper lobe for stage I adenocarcinoma.  REFERRING PROVIDER: Virginia Crews, MD  HPI: patient is a 57 year old female now seen out 4 months having completed SB RT to her right upper lobe for stage I adenocarcinoma. Seen today in routine follow-up she is doing well. She specifically denies cough hemoptysis or chest tightness she's had no side effects from her prior treatment. She did recently have a CT scan.showingsignificant decrease in size of the right upper lobe peripheral lesion consistent with treatment response. She did have 2 tiny 3 mm pulmonary nodules in the peripheral right upper lobe which recognition was for surveillance.  COMPLICATIONS OF TREATMENT: none  FOLLOW UP COMPLIANCE: keeps appointments   PHYSICAL EXAM:  BP 131/86   Pulse 85   Temp (!) 97.5 F (36.4 C)   Resp 18   Wt 225 lb 3.2 oz (102.2 kg)   LMP  (LMP Unknown)   BMI 41.19 kg/m  Well-developed well-nourished patient in NAD. HEENT reveals PERLA, EOMI, discs not visualized.  Oral cavity is clear. No oral mucosal lesions are identified. Neck is clear without evidence of cervical or supraclavicular adenopathy. Lungs are clear to A&P. Cardiac examination is essentially unremarkable with regular rate and rhythm without murmur rub or thrill. Abdomen is benign with no organomegaly or masses noted. Motor sensory and DTR levels are equal and symmetric in the upper and lower extremities. Cranial nerves II through XII are grossly intact. Proprioception is intact. No peripheral adenopathy or edema is identified. No motor or sensory levels are noted. Crude visual fields are within normal range.  RADIOLOGY RESULTS: CT scan reviewed and compatible with the above-stated findings  PLAN: this time  radiologically she is an excellent response to SB RT treatment. I have asked to see her back in 6 months of ordered a CT scan of the chest with contrast prior to that visit. We will keep an eye on her of the right upper lobe nodules.Patient knows to call at anytime with any concerns.  I would like to take this opportunity to thank you for allowing me to participate in the care of your patient.Noreene Filbert, MD

## 2018-04-05 ENCOUNTER — Ambulatory Visit (INDEPENDENT_AMBULATORY_CARE_PROVIDER_SITE_OTHER): Payer: Medicare Other

## 2018-04-05 DIAGNOSIS — Z952 Presence of prosthetic heart valve: Secondary | ICD-10-CM | POA: Diagnosis not present

## 2018-04-05 LAB — POCT INR
INR: 3.1 — AB (ref 2.0–3.0)
PT: 37.7

## 2018-04-08 ENCOUNTER — Other Ambulatory Visit: Payer: Self-pay | Admitting: Internal Medicine

## 2018-04-10 LAB — CUP PACEART REMOTE DEVICE CHECK
Battery Remaining Longevity: 118 mo
Battery Voltage: 3.01 V
Brady Statistic RV Percent Paced: 0.01 %
Date Time Interrogation Session: 20190730041604
HighPow Impedance: 75 Ohm
Implantable Lead Location: 753860
Implantable Pulse Generator Implant Date: 20160802
Lead Channel Impedance Value: 931 Ohm
Lead Channel Sensing Intrinsic Amplitude: 12.375 mV
Lead Channel Setting Pacing Amplitude: 4.25 V
MDC IDC LEAD IMPLANT DT: 20160802
MDC IDC MSMT LEADCHNL RV IMPEDANCE VALUE: 817 Ohm
MDC IDC MSMT LEADCHNL RV PACING THRESHOLD AMPLITUDE: 2 V
MDC IDC MSMT LEADCHNL RV PACING THRESHOLD PULSEWIDTH: 0.4 ms
MDC IDC MSMT LEADCHNL RV SENSING INTR AMPL: 12.375 mV
MDC IDC SET LEADCHNL RV PACING PULSEWIDTH: 0.4 ms
MDC IDC SET LEADCHNL RV SENSING SENSITIVITY: 0.3 mV

## 2018-04-12 ENCOUNTER — Other Ambulatory Visit: Payer: Self-pay | Admitting: Internal Medicine

## 2018-04-19 ENCOUNTER — Other Ambulatory Visit: Payer: Self-pay | Admitting: Family

## 2018-05-03 ENCOUNTER — Ambulatory Visit: Payer: Medicare Other

## 2018-05-05 ENCOUNTER — Ambulatory Visit (INDEPENDENT_AMBULATORY_CARE_PROVIDER_SITE_OTHER): Payer: Medicare Other

## 2018-05-05 DIAGNOSIS — Z952 Presence of prosthetic heart valve: Secondary | ICD-10-CM | POA: Diagnosis not present

## 2018-05-05 LAB — POCT INR
INR: 2.6 (ref 2.0–3.0)
PT: 31

## 2018-05-05 NOTE — Patient Instructions (Signed)
Description   Dx: Mechanical Valve   Continue current dose 7.5mg  M, W, F, Sun and 8.5mg  T, Th, Sat.  Recheck in 4 weeks.

## 2018-05-07 ENCOUNTER — Encounter: Payer: Self-pay | Admitting: Family Medicine

## 2018-05-08 ENCOUNTER — Ambulatory Visit (INDEPENDENT_AMBULATORY_CARE_PROVIDER_SITE_OTHER): Payer: Medicare Other

## 2018-05-08 DIAGNOSIS — I5022 Chronic systolic (congestive) heart failure: Secondary | ICD-10-CM | POA: Diagnosis not present

## 2018-05-08 DIAGNOSIS — Z95810 Presence of automatic (implantable) cardiac defibrillator: Secondary | ICD-10-CM | POA: Diagnosis not present

## 2018-05-09 ENCOUNTER — Telehealth: Payer: Self-pay

## 2018-05-09 NOTE — Telephone Encounter (Signed)
Remote ICM transmission received.  Attempted call to patient and left detailed message, per DPR, regarding transmission and next ICM scheduled for 06/08/2018.  Advised to return call for any fluid symptoms or questions.

## 2018-05-09 NOTE — Progress Notes (Signed)
EPIC Encounter for ICM Monitoring  Patient Name: Vanessa Carlson is a 57 y.o. female Date: 05/09/2018 Primary Care Physican: Virginia Crews, MD Primary Cardiologist: Carlis Abbott, NP HF Electrophysiologist: Faustino Congress Weight: Previous weight  224lbs(at7/24/19office visit)      Attempted call to patient and unable to reach.  Left detailed message, per DPR, regarding transmission.  Transmission reviewed.    Thoracic impedance normal.  Prescribed dosage: Furosemide 40 mg 1 tablet 3 times daily.   Labs: 08/31/2017 Creatinine 1.06, BUN 14, Potassium 3.9, Sodium 139, EGFR 57->60 07/08/2017 Creatinine 1.14, BUN 14, Potassium 4.2, Sodium 140, EGFR 53-62 03/23/2017 Creatinine 0.87, BUN 9, Potassium 3.7, Sodium 138, EGFR >60 02/23/2017 Creatinine 1.04, BUN 13, Potassium 3.8, Sodium 135, EGFR 58->60  02/08/2017 Creatinine 0.87, BUN 8, Potassium 3.2, Sodium 139, EGFR >60  01/28/2017 Creatinine 0.89, BUN 10, Potassium 3.6, Sodium 139, EGFR >60 12/31/2016 Creatinine 0.88, BUN 11, Potassium 3.2, Sodium 142, EGFR >60  Recommendations: Left voice mail with ICM number and encouraged to call if experiencing any fluid symptoms.  Follow-up plan: ICM clinic phone appointment on 06/08/2018.   Office appointment scheduled 05/31/2018 with Darylene Price NP and Dr Saunders Revel 06/23/2018.    Copy of ICM check sent to Dr. Caryl Comes.   3 month ICM trend: 05/08/2018    1 Year ICM trend:       [name], RN 05/09/2018 9:03 AM

## 2018-05-10 ENCOUNTER — Ambulatory Visit
Admission: RE | Admit: 2018-05-10 | Discharge: 2018-05-10 | Disposition: A | Discharge status: Home / Self Care | Payer: Medicare Other | Source: Ambulatory Visit | Attending: Family Medicine | Admitting: Family Medicine

## 2018-05-10 DIAGNOSIS — Z1239 Encounter for other screening for malignant neoplasm of breast: Secondary | ICD-10-CM

## 2018-05-10 DIAGNOSIS — Z1231 Encounter for screening mammogram for malignant neoplasm of breast: Secondary | ICD-10-CM | POA: Insufficient documentation

## 2018-05-10 HISTORY — DX: Personal history of irradiation: Z92.3

## 2018-05-29 NOTE — Progress Notes (Signed)
Patient ID: Vanessa Carlson, female    DOB: [birth year], 57 y.o.   MRN: [id number]  HPI  Vanessa Carlson is a 57 y/o female with a history of cervical cancer, lung cancer, atrial fibrillation, CAD, HTN, MI, AICD and chronic heart failure.   Echo report from 01/05/18 reviewed and showed an EF of 20-25% along with moderate TR, mild Vanessa and a mildly elevated PA pressure of 43 mm Hg. Reviewed echo report done on 11/20/15 which showed an EF of 20-25%.  Has not been admitted or been in the ED in the last 6 months.  She presents today for her follow-up visit with a chief complaint of minimal fatigue upon moderate exertion. She describes this as chronic in nature having been present for several years. She has associated shortness of breath, pedal edema and palpitations along with this. She denies any difficulty sleeping, abdominal distention, chest pain, cough, dizziness or weight gain.   Past Medical History:  Diagnosis Date  . AICD (automatic cardioverter/defibrillator) present   . Anterior myocardial infarction [facility])    a. 10/2014 - occluded LAD, complicated by cardiogenic shock-->Med Rx as interventional team was unable to open LAD.  Vanessa Carlson Cancer of upper lobe of right lung ([place]) 09/2017   radiation therapy right side  . Cervical cancer ([place])    in remission for ~20 yrs, s/p hysterectomy  . Chronic combined systolic and diastolic CHF (congestive heart failure) ([place])    a. 10/2015 Echo: EF 20-25%, sev diast dysfxn, mildly reduced RV fxn. Nl MV prosthesis.  . Coronary artery disease    a. 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  . Essential hypertension   . Ischemic cardiomyopathy    a. 03/2015 s/p MDT single lead AICD;  b. 10/2015 Echo: EF 20-25%.  . Mass of upper lobe of right lung    a. noted on CXR & CT 08/2017 w/ abnl PET CT.  Vanessa Carlson Mitral valve disease    a. 2006 s/p MVR w/ 59mm SJM bileaflet mechanical  valve-->chronic coumadin/ASA.  Vanessa Carlson Personal history of radiation therapy    f/u lung ca   Past Surgical History:  Procedure Laterality Date  . APPENDECTOMY  1998  . BREAST EXCISIONAL BIOPSY Left 80s   benign  . CARDIAC CATHETERIZATION    . CARDIAC DEFIBRILLATOR PLACEMENT    . RADICAL HYSTERECTOMY  1998  . SHOULDER SURGERY Bilateral    rotator cuff tears  . VALVE REPLACEMENT     Mitral valve; 27 mm St. Jude bileaflet valve   Family History  Problem Relation Age of Onset  . Heart attack Mother   . Hypertension Mother   . Diabetes Mother   . Emphysema Father        smoker  . Diabetes Maternal Grandmother   . Kidney disease Maternal Grandmother   . Breast cancer Maternal Aunt   . Breast cancer Maternal Aunt   . Colon cancer Maternal Uncle    Social History   Tobacco Use  . Smoking status: Former Smoker    Packs/day: 1.00    Years: 4.00    Pack years: 4.00    Types: Cigarettes    Last attempt to quit: 1998    Years since quitting: 21.7  . Smokeless tobacco: Never Used  Substance Use Topics  . Alcohol use: No   No Known Allergies  Prior to Admission medications   Medication Sig Start Date End Date Taking? Authorizing  Provider  aspirin 81 MG chewable tablet Chew 81 mg by mouth daily.   Yes [provider]  atorvastatin (LIPITOR) 80 MG tablet Take 1 tablet (80 mg total) by mouth daily. 02/13/18  Yes Bacigalupo, Dionne Bucy, MD  ENTRESTO 24-26 MG TAKE 1 TABLET BY MOUTH TWICE DAILY 04/19/18  Yes Darylene Price A, FNP  fluticasone (FLONASE) 50 MCG/ACT nasal spray Place 2 sprays into both nostrils as needed for allergies or rhinitis.   Yes [provider]  furosemide (LASIX) 40 MG tablet TAKE 1 TABLET BY MOUTH THREE TIMES DAILY 06/15/17  Yes Darylene Price A, FNP  metoprolol succinate (TOPROL-XL) 50 MG 24 hr tablet Take 1/2 tablet (25 mg) by mouth two times a day. Take with or immediately following a meal. 04/11/17  Yes End, Harrell Gave, MD  nitroGLYCERIN (NITROSTAT)  0.3 MG SL tablet Place 1 tablet (0.3 mg total) under the tongue every 5 (five) minutes as needed for chest pain. 02/08/17  Yes Darylene Price A, FNP  spironolactone (ALDACTONE) 25 MG tablet Take 1 tablet (25 mg total) by mouth daily. 08/11/17  Yes Bacigalupo, Dionne Bucy, MD  traMADol (ULTRAM) 50 MG tablet Take 1 tablet (50 mg total) every 6 (six) hours as needed by mouth. 07/08/17  Yes Bacigalupo, Dionne Bucy, MD  traZODone (DESYREL) 50 MG tablet Take 0.5-1 tablets (25-50 mg total) by mouth at bedtime as needed for sleep. 12/07/17  Yes Bacigalupo, Dionne Bucy, MD  warfarin (COUMADIN) 1 MG tablet Take 1 tablet (1 mg total) by mouth as directed. Patient taking differently: Take 1 mg by mouth every Tuesday, Thursday, and Saturday at 6 PM.  08/10/17  Yes Bacigalupo, Dionne Bucy, MD  warfarin (COUMADIN) 7.5 MG tablet Take 8.5 mg PO daily on Mon, Wed, Fri, and 7.5 mg PO daily on Tues, Thurs, Sat, Sun Patient taking differently: 7.5 mg daily at 6 PM. Take 8.5 mg PO daily on Mon, Wed, Fri, and 7.5 mg PO daily on Tues, Thurs, Sat, Sun 02/22/18  Yes Burnette, Clearnce Sorrel, PA-C  metoprolol succinate (TOPROL-XL) 50 MG 24 hr tablet TAKE 1 TABLET BY MOUTH ONCE DAILY. TAKE WITH OR IMMEDIATELY FOLLOWING A MEAL. Patient not taking: Reported on 05/31/2018 04/10/18   End, Harrell Gave, MD  metoprolol succinate (TOPROL-XL) 50 MG 24 hr tablet TAKE 1 TABLET BY MOUTH ONCE DAILY. TAKE WITH OR IMMEDIATELY FOLLOWING A MEAL. Patient not taking: Reported on 05/31/2018 04/12/18   End, Harrell Gave, MD  warfarin (COUMADIN) 2.5 MG tablet Take 8.5 mg PO daily on Mon, Wed, Fri, and 7.5 mg PO daily on Tues, Thurs, Sat, Sun Patient not taking: Reported on 05/31/2018 08/10/17   Virginia Crews, MD    Review of Systems  Constitutional: Positive for fatigue (minimal). Negative for appetite change.  HENT: Negative for congestion, postnasal drip and sore throat.   Eyes: Negative.   Respiratory: Positive for shortness of breath (at times). Negative for  cough and chest tightness.   Cardiovascular: Positive for palpitations and leg swelling (in legs). Negative for chest pain.  Gastrointestinal: Negative for abdominal distention and abdominal pain.  Endocrine: Negative.   Genitourinary: Negative.   Musculoskeletal: Positive for arthralgias (right shoulder (bone spur/arthritis)). Negative for back pain and neck pain.  Skin: Negative.   Allergic/Immunologic: Negative.   Neurological: Negative for dizziness and light-headedness.  Hematological: Negative for adenopathy. Does not bruise/bleed easily.  Psychiatric/Behavioral: Negative for dysphoric mood and sleep disturbance (sleeping on 2 pillows). The patient is not nervous/anxious.    Vitals:   05/31/18 2355  BP: 135/72  Pulse: 79  Resp: 18  SpO2: 98%  Weight: 226 lb 2 oz (102.6 kg)  Height: 5\' 2"  (1.575 m)   Wt Readings from Last 3 Encounters:  05/31/18 226 lb 2 oz (102.6 kg)  03/29/18 225 lb 3.2 oz (102.2 kg)  03/22/18 224 lb 12 oz (101.9 kg)   Lab Results  Component Value Date   CREATININE 0.89 03/22/2018   CREATININE 0.90 03/22/2018   CREATININE 0.91 12/21/2017    Physical Exam  Constitutional: She is oriented to person, place, and time. She appears well-developed and well-nourished.  HENT:  Head: Normocephalic and atraumatic.  Neck: Normal range of motion. Neck supple. No JVD present.  Cardiovascular: Normal rate and regular rhythm.  Pulmonary/Chest: Effort normal. She has no wheezes. She has no rales.  Abdominal: Soft. She exhibits no distension.  Musculoskeletal: She exhibits edema (1+ pitting edema). She exhibits no tenderness.  Neurological: She is alert and oriented to person, place, and time.  Skin: Skin is warm and dry.  Psychiatric: She has a normal mood and affect. Her behavior is normal. Thought content normal.  Nursing note and vitals reviewed.  Assessment & Plan:  1: Chronic heart failure with reduced ejection fraction- - NYHA class II - euvolemic  today - not adding salt to food. Reminded to follow a 2000 mg sodium diet  - weighing daily. Instructed to call for an overnight weight gain of >2 pounds or a weekly weight gain of >5  - weight up a couple of pounds since she was last here 3 months ago - discussed changing her diuretic to torsemide but she would like to wait  - encouraged her to wear her support hose daily with removal at bedtime - discussed increasing metoprolol but she says that when that was previously tried, she had abdominal pain. Could consider changing beta-blocker to carvedilol in the future - saw cardiology (End) 03/22/18 - walking 30 minutes twice weekly at a park and walking stairs - PharmD reconciled medications with the patient - she has not received her flu vaccine yet  2: HTN- - BP looks good today - should BP continue to be ok, consider titrating up entresto  - saw PCP Brita Romp) 03/08/18 - BMP from 03/22/18 reviewed and showed sodium 141, potassium 3.8, creatinine 0.89 and GFR 81  3: Atrial fibrillation- - heart rate controlled at this time - AICD present but denies shocks - continue warfarin and metoprolol succinate  - follows with EP Caryl Comes) and was last seen by him 12/07/16 - INR 05/05/18 was 2.6  4: Lung cancer- - has had radiation treatments which ended early May 2019 - cancer free at this time  Patient did not bring her medications nor a list. Each medication was verbally reviewed with the patient and she was encouraged to bring the bottles to every visit to confirm accuracy of list.    Return in 6 months or sooner for any questions/problems before then.

## 2018-05-31 ENCOUNTER — Ambulatory Visit: Payer: Medicare Other | Attending: Family | Admitting: Family

## 2018-05-31 ENCOUNTER — Encounter: Payer: Self-pay | Admitting: Family

## 2018-05-31 VITALS — BP 135/72 | HR 79 | Resp 18 | Ht 62.0 in | Wt 226.1 lb

## 2018-05-31 DIAGNOSIS — I110 Hypertensive heart disease with heart failure: Secondary | ICD-10-CM | POA: Diagnosis not present

## 2018-05-31 DIAGNOSIS — Z7982 Long term (current) use of aspirin: Secondary | ICD-10-CM | POA: Insufficient documentation

## 2018-05-31 DIAGNOSIS — Z90710 Acquired absence of both cervix and uterus: Secondary | ICD-10-CM | POA: Diagnosis not present

## 2018-05-31 DIAGNOSIS — Z95810 Presence of automatic (implantable) cardiac defibrillator: Secondary | ICD-10-CM | POA: Diagnosis not present

## 2018-05-31 DIAGNOSIS — Z825 Family history of asthma and other chronic lower respiratory diseases: Secondary | ICD-10-CM | POA: Insufficient documentation

## 2018-05-31 DIAGNOSIS — Z952 Presence of prosthetic heart valve: Secondary | ICD-10-CM | POA: Diagnosis not present

## 2018-05-31 DIAGNOSIS — I4891 Unspecified atrial fibrillation: Secondary | ICD-10-CM | POA: Diagnosis not present

## 2018-05-31 DIAGNOSIS — Z833 Family history of diabetes mellitus: Secondary | ICD-10-CM | POA: Diagnosis not present

## 2018-05-31 DIAGNOSIS — I480 Paroxysmal atrial fibrillation: Secondary | ICD-10-CM

## 2018-05-31 DIAGNOSIS — Z8541 Personal history of malignant neoplasm of cervix uteri: Secondary | ICD-10-CM | POA: Diagnosis not present

## 2018-05-31 DIAGNOSIS — Z8249 Family history of ischemic heart disease and other diseases of the circulatory system: Secondary | ICD-10-CM | POA: Insufficient documentation

## 2018-05-31 DIAGNOSIS — Z79899 Other long term (current) drug therapy: Secondary | ICD-10-CM | POA: Insufficient documentation

## 2018-05-31 DIAGNOSIS — C3490 Malignant neoplasm of unspecified part of unspecified bronchus or lung: Secondary | ICD-10-CM | POA: Insufficient documentation

## 2018-05-31 DIAGNOSIS — Z85118 Personal history of other malignant neoplasm of bronchus and lung: Secondary | ICD-10-CM | POA: Insufficient documentation

## 2018-05-31 DIAGNOSIS — Z923 Personal history of irradiation: Secondary | ICD-10-CM | POA: Insufficient documentation

## 2018-05-31 DIAGNOSIS — I252 Old myocardial infarction: Secondary | ICD-10-CM | POA: Insufficient documentation

## 2018-05-31 DIAGNOSIS — I509 Heart failure, unspecified: Secondary | ICD-10-CM | POA: Diagnosis present

## 2018-05-31 DIAGNOSIS — Z7901 Long term (current) use of anticoagulants: Secondary | ICD-10-CM | POA: Diagnosis not present

## 2018-05-31 DIAGNOSIS — I5022 Chronic systolic (congestive) heart failure: Secondary | ICD-10-CM

## 2018-05-31 DIAGNOSIS — Z87891 Personal history of nicotine dependence: Secondary | ICD-10-CM | POA: Insufficient documentation

## 2018-05-31 DIAGNOSIS — I2510 Atherosclerotic heart disease of native coronary artery without angina pectoris: Secondary | ICD-10-CM | POA: Insufficient documentation

## 2018-05-31 DIAGNOSIS — I10 Essential (primary) hypertension: Secondary | ICD-10-CM

## 2018-05-31 NOTE — Patient Instructions (Signed)
Continue weighing daily and call for an overnight weight gain of > 2 pounds or a weekly weight gain of >5 pounds. 

## 2018-06-02 ENCOUNTER — Ambulatory Visit (INDEPENDENT_AMBULATORY_CARE_PROVIDER_SITE_OTHER): Payer: Medicare Other | Admitting: *Deleted

## 2018-06-02 DIAGNOSIS — Z952 Presence of prosthetic heart valve: Secondary | ICD-10-CM

## 2018-06-02 LAB — POCT INR
INR: 1.8 — AB (ref 2.0–3.0)
PT: 21.8

## 2018-06-02 NOTE — Patient Instructions (Signed)
Take 8.5 mg daily. Return in 2 weeks for recheck.

## 2018-06-08 ENCOUNTER — Telehealth: Payer: Self-pay

## 2018-06-08 ENCOUNTER — Ambulatory Visit (INDEPENDENT_AMBULATORY_CARE_PROVIDER_SITE_OTHER): Payer: Medicare Other

## 2018-06-08 DIAGNOSIS — Z95810 Presence of automatic (implantable) cardiac defibrillator: Secondary | ICD-10-CM

## 2018-06-08 DIAGNOSIS — I5022 Chronic systolic (congestive) heart failure: Secondary | ICD-10-CM

## 2018-06-08 NOTE — Telephone Encounter (Signed)
Remote ICM transmission received.  Attempted call to patient and left detailed message, per DPR, regarding transmission and next ICM scheduled for 07/10/2018.  Advised to return call for any fluid symptoms or questions.

## 2018-06-08 NOTE — Progress Notes (Signed)
EPIC Encounter for ICM Monitoring  Patient Name: Vanessa Carlson is a 57 y.o. female Date: 06/08/2018 Primary Care Physican: Virginia Crews, MD Primary Cardiologist: Carlis Abbott, NP HF Electrophysiologist: Faustino Congress Weight:Previous weight224lbs(at7/24/19office visit)          Attempted call to patient and unable to reach.  Left detailed message, per DPR, regarding transmission.  Transmission reviewed.    Thoracic impedance normal.   Prescribed: Furosemide 40 mg 1 tablet 3 times daily.   Labs: 03/22/2018 Creatinine 0.89, BUN 14, Potassium 3.8, Sodium 141, eGFR >60 12/21/2017 Creatinine 0.91, BUN 9,   Potassium 3.9, Sodium 143, eGFR 70-81 08/31/2017 Creatinine 1.06, BUN 14, Potassium 3.9, Sodium 139, EGFR 57->60  Recommendations: Left voice mail with ICM number and encouraged to call if experiencing any fluid symptoms.  Follow-up plan: ICM clinic phone appointment on 07/10/2018.    Copy of ICM check sent to Dr. Caryl Comes.   3 month ICM trend: 06/08/2018    1 Year ICM trend:       [name], RN 06/08/2018 2:35 PM

## 2018-06-16 ENCOUNTER — Ambulatory Visit (INDEPENDENT_AMBULATORY_CARE_PROVIDER_SITE_OTHER): Payer: Medicare Other

## 2018-06-16 DIAGNOSIS — Z952 Presence of prosthetic heart valve: Secondary | ICD-10-CM

## 2018-06-16 LAB — POCT INR
INR: 2.2 (ref 2.0–3.0)
PT: 26.9

## 2018-06-16 MED ORDER — WARFARIN SODIUM 10 MG PO TABS: 10.0000 mg | ORAL_TABLET | ORAL | 0 refills | Status: DC

## 2018-06-16 NOTE — Patient Instructions (Signed)
8.5mg  daily except 10mg  on Fridays.  Recheck in two weeks.

## 2018-06-17 ENCOUNTER — Other Ambulatory Visit: Payer: Self-pay | Admitting: Family

## 2018-06-17 ENCOUNTER — Encounter: Payer: Self-pay | Admitting: Family Medicine

## 2018-06-23 ENCOUNTER — Ambulatory Visit: Payer: Medicare Other | Admitting: Internal Medicine

## 2018-06-27 ENCOUNTER — Telehealth: Payer: Self-pay | Admitting: Cardiology

## 2018-06-27 ENCOUNTER — Ambulatory Visit (INDEPENDENT_AMBULATORY_CARE_PROVIDER_SITE_OTHER): Payer: Medicare Other | Admitting: *Deleted

## 2018-06-27 DIAGNOSIS — I429 Cardiomyopathy, unspecified: Secondary | ICD-10-CM | POA: Diagnosis not present

## 2018-06-27 NOTE — Telephone Encounter (Signed)
LMOVM reminding pt to send remote transmission.   

## 2018-06-28 NOTE — Progress Notes (Signed)
Remote ICD transmission.   

## 2018-07-06 ENCOUNTER — Ambulatory Visit: Payer: Medicare Other

## 2018-07-10 ENCOUNTER — Ambulatory Visit (INDEPENDENT_AMBULATORY_CARE_PROVIDER_SITE_OTHER): Payer: Medicare Other

## 2018-07-10 DIAGNOSIS — Z95810 Presence of automatic (implantable) cardiac defibrillator: Secondary | ICD-10-CM | POA: Diagnosis not present

## 2018-07-10 DIAGNOSIS — I5022 Chronic systolic (congestive) heart failure: Secondary | ICD-10-CM | POA: Diagnosis not present

## 2018-07-10 NOTE — Progress Notes (Signed)
EPIC Encounter for ICM Monitoring  Patient Name: Vanessa Carlson is a 57 y.o. female Date: 07/10/2018 Primary Care Physican: Virginia Crews, MD Primary Cardiologist: Carlis Abbott, NP HF Electrophysiologist: Caryl Comes Last Weight: 224lbs Today's Weight: 224 lbs       Heart Failure questions reviewed, pt asymptomatic.  She said she took extra fluid pill during a couple of days of decreased impedance.   Thoracic impedance normal but had some days that were abnormal suggesting fluid accumulation  Within the last month.    Prescribed: Furosemide 40 mg 1 tablet 3 times daily.   Labs: 03/22/2018 Creatinine 0.89, BUN 14, Potassium 3.8, Sodium 141, eGFR >60 12/21/2017 Creatinine 0.91, BUN 9,   Potassium 3.9, Sodium 143, eGFR 70-81 08/31/2017 Creatinine 1.06, BUN 14, Potassium 3.9, Sodium 139, EGFR 57->60  Recommendations: No changes.    Encouraged to call for fluid symptoms.  Follow-up plan: ICM clinic phone appointment on 08/10/2018.      Copy of ICM check sent to Dr. Caryl Comes.   3 month ICM trend: 07/10/2018    1 Year ICM trend:       [name], RN 07/10/2018 4:42 PM

## 2018-07-11 ENCOUNTER — Ambulatory Visit (INDEPENDENT_AMBULATORY_CARE_PROVIDER_SITE_OTHER): Payer: Medicare Other

## 2018-07-11 DIAGNOSIS — Z952 Presence of prosthetic heart valve: Secondary | ICD-10-CM | POA: Diagnosis not present

## 2018-07-11 LAB — POCT INR
INR: 6.6 — AB (ref 2.0–3.0)
PT: 78.7

## 2018-07-11 NOTE — Patient Instructions (Signed)
Description   Dx: Mechanical Valve  Hold todays dose, start 7.5mg  daily except 8.5 mg on Mondays.  Recheck in 1 (one) week.

## 2018-07-18 ENCOUNTER — Ambulatory Visit (INDEPENDENT_AMBULATORY_CARE_PROVIDER_SITE_OTHER): Payer: Medicare Other

## 2018-07-18 DIAGNOSIS — Z952 Presence of prosthetic heart valve: Secondary | ICD-10-CM | POA: Diagnosis not present

## 2018-07-18 LAB — POCT INR
INR: 3 (ref 2.0–3.0)
PT: 36.5

## 2018-07-18 NOTE — Patient Instructions (Signed)
Description   Dx: Mechanical Valve  Continue 7.5mg  daily except 8.5 mg on Mondays.  Recheck in 2 (two) week.

## 2018-07-19 ENCOUNTER — Encounter: Payer: Self-pay | Admitting: Family Medicine

## 2018-08-01 ENCOUNTER — Ambulatory Visit: Payer: Self-pay

## 2018-08-02 ENCOUNTER — Ambulatory Visit: Payer: Medicare Other

## 2018-08-09 ENCOUNTER — Ambulatory Visit (INDEPENDENT_AMBULATORY_CARE_PROVIDER_SITE_OTHER): Payer: Medicare Other

## 2018-08-09 DIAGNOSIS — Z952 Presence of prosthetic heart valve: Secondary | ICD-10-CM

## 2018-08-09 LAB — POCT INR
INR: 2.5 (ref 2.0–3.0)
PT: 29.6

## 2018-08-09 NOTE — Patient Instructions (Signed)
Description   Dx: Mechanical Valve  Continue 7.5mg  daily except 10 mg on Mondays.  Recheck in 4 (four) week.

## 2018-08-10 ENCOUNTER — Ambulatory Visit (INDEPENDENT_AMBULATORY_CARE_PROVIDER_SITE_OTHER): Payer: Medicare Other

## 2018-08-10 DIAGNOSIS — I5022 Chronic systolic (congestive) heart failure: Secondary | ICD-10-CM

## 2018-08-10 DIAGNOSIS — Z95810 Presence of automatic (implantable) cardiac defibrillator: Secondary | ICD-10-CM

## 2018-08-10 NOTE — Progress Notes (Signed)
EPIC Encounter for ICM Monitoring  Patient Name: Vanessa Carlson is a 57 y.o. female Date: 08/10/2018 Primary Care Physican: Virginia Crews, MD Primary Cardiologist: Carlis Abbott, NP HF Electrophysiologist: Caryl Comes Last Weight: 224lbs Today's Weight: unknown       Transmission reviewed   Thoracic impedance normal.   Prescribed: Furosemide 40 mg 1 tablet 3 times daily.   Labs: 03/22/2018 Creatinine 0.89, BUN 14, Potassium 3.8, Sodium 141, eGFR >60 12/21/2017 Creatinine 0.91, BUN 9, Potassium 3.9, Sodium 143, eGFR 70-81 08/31/2017 Creatinine 1.06, BUN 14, Potassium 3.9, Sodium 139, EGFR 57->60   Recommendations: None.  Follow-up plan: ICM clinic phone appointment on 09/26/2018.   Office appointment scheduled 08/18/2018 with Dr. Saunders Revel.    Copy of ICM check sent to Dr. Caryl Comes.   3 month ICM trend: 08/10/2018    1 Year ICM trend:       [name], RN 08/10/2018 5:28 PM

## 2018-08-12 ENCOUNTER — Encounter: Payer: Self-pay | Admitting: Family Medicine

## 2018-08-12 ENCOUNTER — Other Ambulatory Visit: Payer: Self-pay | Admitting: Family Medicine

## 2018-08-15 NOTE — Progress Notes (Signed)
Follow-up Outpatient Visit Date: 08/18/2018  Primary Care Provider: Virginia Crews, MD [address] [place] [address]  Chief Complaint: Follow-up cardiomyopathy, valvular heart disease, and coronary artery disease.  HPI:  Vanessa Carlson is a 56 y.o. 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  I last saw the patient in July.  At that time, she was feeling well other than occasional ankle edema.  She saw Darylene Price, NP, in early October, which time symptoms were stable.  No medication changes were made.  Today, the patient reports that she continues to feel well.  She has noted some occasional edema as well as recent weight gain.  This morning, she was 3 pounds above her baseline weight on her home scale.  She has stable two-pillow orthopnea without PND.  She denies chest pain, shortness of breath, palpitations, and lightheadedness.  She is compliant with her medications.  She has not had any significant bleeding.  She notes occasional numbness and tingling in her fingers and feet but no other focal neurologic deficits.  --------------------------------------------------------------------------------------------------  Cardiovascular History & Procedures: Cardiovascular Problems:  Coronary artery disease status post anterior STEMI (2016)  Ischemic cardiomyopathy with chronic systolic heart failure  Rheumatic heart disease status post mechanical mitral valve replacement (2006)  Risk Factors:  Known coronary artery disease and hypertension  Cath/PCI:  LHC (2016, [place]): Reportedly  proximal occlusion of the LAD, which could not be crossed with a wire.  CV Surgery:  Mitral valve replacement (2006, [facility]): 27 mm St. Jude bileaflet valve  EP Procedures and Devices:  ICD (04/01/15, [facility]): Medtronic single-chamber ICD  Non-Invasive Evaluation(s):  TTE (01/05/18): Mildly dilated LV.  LVEF 20 to 25% with diffuse hypokinesis and severe hypokinesis of the anterior and anteroseptal myocardium.  Apical akinesis.  Mechanical mitral valve with mean gradient of 4 mmHg.  Mild left atrial enlargement.  Normal RV function.  Moderate TR.  PASP 43 mmHg.  TTE (11/20/15): Normal LV size and wall thickness. LVEF 20-25% with severe diastolic dysfunction. Mildly reduced RV contraction. Normal MV prosthesis function.  Recent CV Pertinent Labs: Lab Results  Component Value Date   CHOL 133 07/08/2017   HDL 47 (L) 07/08/2017   LDLCALC 66 07/08/2017   TRIG 121 07/08/2017   CHOLHDL 2.8 07/08/2017   INR 2.5 08/09/2018   INR 1.10 09/23/2017   K 3.8 03/22/2018   BUN 14 03/22/2018   BUN 9 12/21/2017   CREATININE 0.89 03/22/2018   CREATININE 1.14 (H) 07/08/2017    Past medical and surgical history were reviewed and updated in EPIC.  Current Meds  Medication Sig  . aspirin 81 MG chewable tablet Chew 81 mg by mouth daily.  [name] atorvastatin (LIPITOR) 80 MG tablet TAKE 1 TABLET BY MOUTH ONCE DAILY  . ENTRESTO 24-26 MG TAKE 1 TABLET BY MOUTH TWICE DAILY  . fluticasone (FLONASE) 50 MCG/ACT nasal spray Place 2 sprays into both nostrils as needed for allergies or rhinitis.  . furosemide (LASIX) 40 MG tablet TAKE 1 TABLET BY MOUTH THREE TIMES DAILY  . metoprolol succinate (TOPROL-XL) 50 MG 24 hr tablet Take 1/2 tablet (25 mg) by mouth two times a day. Take with or immediately following a meal.  .  nitroGLYCERIN (NITROSTAT) 0.3 MG SL tablet Place 1 tablet (0.3 mg total) under the tongue every 5 (five) minutes as needed for chest pain.  [name] spironolactone (ALDACTONE) 25 MG tablet TAKE 1 TABLET BY MOUTH  ONCE DAILY  . traMADol (ULTRAM) 50 MG tablet Take 1 tablet (50 mg total) every 6 (six) hours as needed by mouth.  . traZODone (DESYREL) 50 MG tablet Take 0.5-1 tablets (25-50 mg total) by mouth at bedtime as needed for sleep.  [name] warfarin (COUMADIN) 1 MG tablet Take 1 tablet (1 mg total) by mouth as directed. (Patient taking differently: Take 1 mg by mouth every Tuesday, Thursday, and Saturday at 6 PM. )  . warfarin (COUMADIN) 10 MG tablet Take 1 tablet (10 mg total) by mouth as directed.  . warfarin (COUMADIN) 2.5 MG tablet Take 8.5 mg PO daily on Mon, Wed, Fri, and 7.5 mg PO daily on Tues, Thurs, Sat, Sun  . warfarin (COUMADIN) 7.5 MG tablet Take 8.5 mg PO daily on Mon, Wed, Fri, and 7.5 mg PO daily on Tues, Thurs, Sat, Sun (Patient taking differently: 7.5 mg daily at 6 PM. Take 8.5 mg PO daily on Mon, Wed, Fri, and 7.5 mg PO daily on Tues, Thurs, Sat, Sun)    Allergies: Patient has no known allergies.  Social History   Tobacco Use  . Smoking status: Former Smoker    Packs/day: 1.00    Years: 4.00    Pack years: 4.00    Types: Cigarettes    Last attempt to quit: 1998    Years since quitting: 21.9  . Smokeless tobacco: Never Used  Substance Use Topics  . Alcohol use: No  . Drug use: No    Family History  Problem Relation Age of Onset  . Heart attack Mother   . Hypertension Mother   . Diabetes Mother   . Emphysema Father        smoker  . Diabetes Maternal Grandmother   . Kidney disease Maternal Grandmother   . Breast cancer Maternal Aunt   . Breast cancer Maternal Aunt   . Colon cancer Maternal Uncle     Review of Systems: A 12-system review of systems was performed and was negative except as noted in the HPI.  --------------------------------------------------------------------------------------------------  Physical Exam: BP 102/64 (BP Location: Left Arm, Patient Position: Sitting, Cuff Size: Large)   Pulse 72   Ht 5' 2.5" (1.588 m)   Wt 232 lb 4 oz (105.3 kg)    LMP  (LMP Unknown)   BMI 41.80 kg/m   General: NAD. HEENT: No conjunctival pallor or scleral icterus. Moist mucous membranes.  OP clear. Neck: Supple without lymphadenopathy, thyromegaly, JVD, or HJR. No carotid bruit. Lungs: Normal work of breathing. Clear to auscultation bilaterally without wheezes or crackles. Heart: Regular rate and rhythm with mechanical S1.  There is a 2/6 systolic murmur loudest at the right upper sternal border.  No rubs or gallops.  Unable to assess PMI due to body habitus. Abd: Bowel sounds present. Soft, NT/ND.  Unable to assess HSM due to body habitus. Ext: 1+ distal calf edema bilaterally. Skin: Warm and dry without rash.  EKG: Normal sinus rhythm with lateral Q waves and nonspecific T wave changes.  No significant change since 03/22/2018.  Lab Results  Component Value Date   WBC 6.7 09/23/2017   HGB 13.6 09/23/2017   HCT 41.4 09/23/2017   MCV 85.2 09/23/2017   PLT 167 09/23/2017    Lab Results  Component Value Date  NA 141 03/22/2018   K 3.8 03/22/2018   CL 106 03/22/2018   CO2 28 03/22/2018   BUN 14 03/22/2018   CREATININE 0.89 03/22/2018   GLUCOSE 104 (H) 03/22/2018   ALT 12 07/08/2017    Lab Results  Component Value Date   CHOL 133 07/08/2017   HDL 47 (L) 07/08/2017   LDLCALC 66 07/08/2017   TRIG 121 07/08/2017   CHOLHDL 2.8 07/08/2017    --------------------------------------------------------------------------------------------------  ASSESSMENT AND PLAN: Coronary artery disease without angina No symptoms to suggest worsening coronary insufficiency.  Continue current medications for secondary prevention.  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  She also reports a 3 pound weight gain in the last few days at home.  Exam is notable for mild leg edema.  I have recommended that she take 80 mg of  furosemide for her next dose today and then return to 40 mg 3 times daily.  She should take at least one 80 mg dose daily until she returns back to her baseline home weight of 225 pounds.  We will continue current doses of metoprolol succinate, spironolactone, and Entresto.  I will check a complete metabolic panel today.  Continue routine follow-up of ICD through the device clinic.  Valvular heart disease No symptoms to suggest valvular dysfunction.  Exam notable for crisp S1 with soft systolic murmur.  Continue aspirin and warfarin as well as SBE prophylaxis.  I will check a CBC today to ensure normal blood counts in the setting of long-term anticoagulation.  Hyperlipidemia Most recent LDL a year ago at goal.  We will repeat a CMP and fasting lipid panel today.  Follow-up: Return to clinic in 3 months.  [name], MD 08/18/2018 10:07 AM

## 2018-08-18 ENCOUNTER — Encounter: Payer: Self-pay | Admitting: Internal Medicine

## 2018-08-18 ENCOUNTER — Ambulatory Visit (INDEPENDENT_AMBULATORY_CARE_PROVIDER_SITE_OTHER): Payer: Medicare Other | Admitting: Internal Medicine

## 2018-08-18 VITALS — BP 102/64 | HR 72 | Ht 62.5 in | Wt 232.2 lb

## 2018-08-18 DIAGNOSIS — I38 Endocarditis, valve unspecified: Secondary | ICD-10-CM | POA: Insufficient documentation

## 2018-08-18 DIAGNOSIS — I428 Other cardiomyopathies: Secondary | ICD-10-CM

## 2018-08-18 DIAGNOSIS — I2510 Atherosclerotic heart disease of native coronary artery without angina pectoris: Secondary | ICD-10-CM | POA: Diagnosis not present

## 2018-08-18 DIAGNOSIS — I5022 Chronic systolic (congestive) heart failure: Secondary | ICD-10-CM

## 2018-08-18 DIAGNOSIS — I429 Cardiomyopathy, unspecified: Secondary | ICD-10-CM | POA: Insufficient documentation

## 2018-08-18 DIAGNOSIS — E785 Hyperlipidemia, unspecified: Secondary | ICD-10-CM | POA: Diagnosis not present

## 2018-08-18 NOTE — Patient Instructions (Signed)
Medication Instructions:  Your physician has recommended you make the following change in your medication:  1- TAKE Furosemide 80 mg this afternoon, then resume your Furosemide, as you usually take, to 40 mg by mouth three times a day.  If you're weight increases, you may take furosemide 80 mg in place of 40 mg for one of your 3 daily doses.   If you need a refill on your cardiac medications before your next appointment, please call your pharmacy.   Lab work: Your physician recommends that you return for lab work in: TODAY - CBC, CMP, LIPID.  If you have labs (blood work) drawn today and your tests are completely normal, you will receive your results only by: [name] MyChart Message (if you have MyChart) OR . A paper copy in the mail If you have any lab test that is abnormal or we need to change your treatment, we will call you to review the results.  Testing/Procedures: none  Follow-Up: At [facility], you and your health needs are our priority.  As part of our continuing mission to provide you with exceptional heart care, we have created designated Provider Care Teams.  These Care Teams include your primary Cardiologist (physician) and Advanced Practice Providers (APPs -  Physician Assistants and Nurse Practitioners) who all work together to provide you with the care you need, when you need it. You will need a follow up appointment in 3 months. You may see [name], MD or one of the following Advanced Practice Providers on your designated Care Team:   Murray Hodgkins, NP Christell Faith, PA-C . Marrianne Mood, PA-C

## 2018-08-19 LAB — CBC
HEMATOCRIT: 38 % (ref 34.0–46.6)
Hemoglobin: 12.3 g/dL (ref 11.1–15.9)
MCH: 27.5 pg (ref 26.6–33.0)
MCHC: 32.4 g/dL (ref 31.5–35.7)
MCV: 85 fL (ref 79–97)
Platelets: 80 10*3/uL — CL (ref 150–450)
RBC: 4.47 x10E6/uL (ref 3.77–5.28)
RDW: 14.5 % (ref 12.3–15.4)
WBC: 5.4 10*3/uL (ref 3.4–10.8)

## 2018-08-19 LAB — COMPREHENSIVE METABOLIC PANEL
ALBUMIN: 4.2 g/dL (ref 3.5–5.5)
ALT: 16 IU/L (ref 0–32)
AST: 20 IU/L (ref 0–40)
Albumin/Globulin Ratio: 1.5 (ref 1.2–2.2)
Alkaline Phosphatase: 89 IU/L (ref 39–117)
BUN/Creatinine Ratio: 9 (ref 9–23)
BUN: 10 mg/dL (ref 6–24)
Bilirubin Total: 0.6 mg/dL (ref 0.0–1.2)
CO2: 23 mmol/L (ref 20–29)
Calcium: 9.2 mg/dL (ref 8.7–10.2)
Chloride: 106 mmol/L (ref 96–106)
Creatinine, Ser: 1.06 mg/dL — ABNORMAL HIGH (ref 0.57–1.00)
GFR calc Af Amer: 67 mL/min/{1.73_m2} (ref >59)
GFR calc non Af Amer: 58 mL/min/{1.73_m2} — ABNORMAL LOW (ref >59)
GLOBULIN, TOTAL: 2.8 g/dL (ref 1.5–4.5)
Glucose: 133 mg/dL — ABNORMAL HIGH (ref 65–99)
Potassium: 3.9 mmol/L (ref 3.5–5.2)
Sodium: 142 mmol/L (ref 134–144)
Total Protein: 7 g/dL (ref 6.0–8.5)

## 2018-08-19 LAB — LIPID PANEL
Chol/HDL Ratio: 2.9 ratio (ref 0.0–4.4)
Cholesterol, Total: 117 mg/dL (ref 100–199)
HDL: 40 mg/dL (ref >39)
LDL Calculated: 58 mg/dL (ref 0–99)
Triglycerides: 96 mg/dL (ref 0–149)
VLDL Cholesterol Cal: 19 mg/dL (ref 5–40)

## 2018-08-21 ENCOUNTER — Other Ambulatory Visit
Admission: RE | Admit: 2018-08-21 | Discharge: 2018-08-21 | Disposition: A | Discharge status: Home / Self Care | Payer: Medicare Other | Source: Ambulatory Visit | Attending: Internal Medicine | Admitting: Internal Medicine

## 2018-08-21 ENCOUNTER — Telehealth: Payer: Self-pay | Admitting: *Deleted

## 2018-08-21 DIAGNOSIS — I5022 Chronic systolic (congestive) heart failure: Secondary | ICD-10-CM | POA: Diagnosis not present

## 2018-08-21 DIAGNOSIS — D696 Thrombocytopenia, unspecified: Secondary | ICD-10-CM | POA: Insufficient documentation

## 2018-08-21 LAB — CBC WITH DIFFERENTIAL/PLATELET
Abs Immature Granulocytes: 0.02 10*3/uL (ref 0.00–0.07)
Basophils Absolute: 0 10*3/uL (ref 0.0–0.1)
Basophils Relative: 1 %
Eosinophils Absolute: 0.1 10*3/uL (ref 0.0–0.5)
Eosinophils Relative: 2 %
HCT: 39 % (ref 36.0–46.0)
Hemoglobin: 12.5 g/dL (ref 12.0–15.0)
IMMATURE GRANULOCYTES: 0 %
Lymphocytes Relative: 26 %
Lymphs Abs: 1.4 10*3/uL (ref 0.7–4.0)
MCH: 27.8 pg (ref 26.0–34.0)
MCHC: 32.1 g/dL (ref 30.0–36.0)
MCV: 86.9 fL (ref 80.0–100.0)
Monocytes Absolute: 0.4 10*3/uL (ref 0.1–1.0)
Monocytes Relative: 7 %
Neutro Abs: 3.5 10*3/uL (ref 1.7–7.7)
Neutrophils Relative %: 64 %
PLATELETS: 167 10*3/uL (ref 150–400)
RBC: 4.49 MIL/uL (ref 3.87–5.11)
RDW: 15.1 % (ref 11.5–15.5)
WBC: 5.4 10*3/uL (ref 4.0–10.5)
nRBC: 0 % (ref 0.0–0.2)

## 2018-08-21 NOTE — Telephone Encounter (Signed)
Results called to pt. Pt verbalized understanding. She cannot go get the lab work today but is [facility] I advised patient we closed at 12 noon tomorrow. She verbalized understanding.

## 2018-08-21 NOTE — Telephone Encounter (Signed)
-----   Message from [name], MD sent at 08/20/2018  9:43 PM EST ----- Please let Vanessa Carlson know that her kidney function and electrolytes are stable. Lipids are at goal.  She has new thrombocytopenia with platelet count of 80,000.  I recommend repeat a CBC on Monday (12/23) to confirm that low platelet count is true.  If so, she may need to be seen by hematology for further evaluation.  She should continue her current medications for now.

## 2018-08-22 NOTE — Telephone Encounter (Signed)
No answer. Left detail message with results, ok per DPR, and to call back if any questions.

## 2018-08-22 NOTE — Telephone Encounter (Signed)
It looks like repeat BMP was done yesterday and shows normal platelet count, back at baseline.  I suspect low platelet count earlier this week was spurious.  She should continue her current medications and f/u as previously discussed.  No further workup is needed.  [name], MD [facility] Pager: [phone]

## 2018-08-27 LAB — CUP PACEART REMOTE DEVICE CHECK
Battery Remaining Longevity: 115 mo
Battery Voltage: 3.01 V
Brady Statistic RV Percent Paced: 0 %
Date Time Interrogation Session: 20191029062606
HighPow Impedance: 81 Ohm
Implantable Lead Location: 753860
Implantable Pulse Generator Implant Date: 20160802
Lead Channel Impedance Value: 874 Ohm
Lead Channel Impedance Value: 950 Ohm
Lead Channel Pacing Threshold Amplitude: 1.375 V
Lead Channel Sensing Intrinsic Amplitude: 12 mV
Lead Channel Setting Pacing Amplitude: 2.75 V
Lead Channel Setting Pacing Pulse Width: 0.4 ms
Lead Channel Setting Sensing Sensitivity: 0.3 mV
MDC IDC LEAD IMPLANT DT: 20160802
MDC IDC MSMT LEADCHNL RV PACING THRESHOLD PULSEWIDTH: 0.4 ms
MDC IDC MSMT LEADCHNL RV SENSING INTR AMPL: 12 mV

## 2018-09-06 ENCOUNTER — Ambulatory Visit (INDEPENDENT_AMBULATORY_CARE_PROVIDER_SITE_OTHER): Payer: Medicare HMO

## 2018-09-06 DIAGNOSIS — Z952 Presence of prosthetic heart valve: Secondary | ICD-10-CM | POA: Diagnosis not present

## 2018-09-06 LAB — POCT INR
INR: 3.6 — AB (ref 2.0–3.0)
PT: 43.7

## 2018-09-06 NOTE — Patient Instructions (Signed)
Description   Dx: Mechanical Valve  Continue 7.5mg  daily except 10 mg on Mondays.  Recheck in 2 (two) week.

## 2018-09-08 ENCOUNTER — Ambulatory Visit (INDEPENDENT_AMBULATORY_CARE_PROVIDER_SITE_OTHER): Payer: Medicare HMO | Admitting: Family Medicine

## 2018-09-08 ENCOUNTER — Encounter: Payer: Self-pay | Admitting: Family Medicine

## 2018-09-08 VITALS — BP 122/75 | HR 82 | Temp 98.6°F | Wt 226.6 lb

## 2018-09-08 DIAGNOSIS — G4700 Insomnia, unspecified: Secondary | ICD-10-CM | POA: Diagnosis not present

## 2018-09-08 DIAGNOSIS — Z23 Encounter for immunization: Secondary | ICD-10-CM | POA: Diagnosis not present

## 2018-09-08 DIAGNOSIS — I10 Essential (primary) hypertension: Secondary | ICD-10-CM | POA: Diagnosis not present

## 2018-09-08 DIAGNOSIS — M79621 Pain in right upper arm: Secondary | ICD-10-CM | POA: Diagnosis not present

## 2018-09-08 DIAGNOSIS — E785 Hyperlipidemia, unspecified: Secondary | ICD-10-CM

## 2018-09-08 MED ORDER — DICLOFENAC SODIUM 1 % TD GEL: 2.0000 g | Freq: Four times a day (QID) | TRANSDERMAL | 2 refills | Status: DC

## 2018-09-08 NOTE — Assessment & Plan Note (Signed)
Unclear etiology Patient does have some pain in her shoulder and pain with internal rotation suggestive of possible bursitis, but she does not have impingement symptoms on exam The majority of her pain and tenderness is in her axilla but there are no abnormalities on exam except for some mild bruising We will continue to treat with ice and topical NSAIDs versus topical lidocaine Discussed return precautions She will follow-up in 1 month if this is not resolved and we will consider further investigation and possible imaging

## 2018-09-08 NOTE — Assessment & Plan Note (Signed)
Discussed importance of healthy weight, diet, and exercise especially given her heart history

## 2018-09-08 NOTE — Assessment & Plan Note (Signed)
Well-controlled Continue current meds Also followed by cardiology Reviewed recent labs Follow-up in 6 months

## 2018-09-08 NOTE — Assessment & Plan Note (Signed)
Reviewed recent lipid panel with LDL at goal Continue atorvastatin 80 mg daily Also followed by cardiology Discussed diet and exercise

## 2018-09-08 NOTE — Assessment & Plan Note (Signed)
Well-controlled Discussed sleep hygiene No longer needing trazodone

## 2018-09-08 NOTE — Progress Notes (Signed)
Patient: Vanessa Carlson Female    DOB: 1961/04/05   58 y.o.   MRN: [id number] Visit Date: 09/08/2018  Today's Provider: Lavon Paganini, MD   Chief Complaint  Patient presents with  . Hypertension  . Hyperlipidemia   Subjective:    I, Tiburcio Pea, CMA, am acting as a Education administrator for Lavon Paganini, MD.   HPI  Hypertension, follow-up:  BP Readings from Last 3 Encounters:  09/08/18 122/75  08/18/18 102/64  05/31/18 135/72    She was last seen for hypertension 6 months ago.  BP at that visit was 100/62. Management since that visit includes no changes .She reports good compliance with treatment. She is not having side effects.  She is exercising. She is adherent to low salt diet.   Outside blood pressures are being checked occasionally. She is experiencing none. Patient denies chest pain, chest pressure/discomfort, claudication, dyspnea, exertional chest pressure/discomfort, fatigue, irregular heart beat, lower extremity edema, near-syncope, orthopnea, palpitations, paroxysmal nocturnal dyspnea, syncope and tachypnea.   Cardiovascular risk factors include dyslipidemia, hypertension and obesity (BMI >= 30 kg/m2).  Use of agents associated with hypertension: none.   ------------------------------------------------------------------------    Lipid/Cholesterol, Follow-up:   Last seen for this 6 months ago.  Management since that visit includes no changes.  Last Lipid Panel:    Component Value Date/Time   CHOL 117 08/18/2018 0957   TRIG 96 08/18/2018 0957   HDL 40 08/18/2018 0957   CHOLHDL 2.9 08/18/2018 0957   CHOLHDL 2.8 07/08/2017 1127   LDLCALC 58 08/18/2018 0957   LDLCALC 66 07/08/2017 1127    She reports good compliance with treatment. She is not having side effects.   Wt Readings from Last 3 Encounters:  09/08/18 226 lb 9.6 oz (102.8 kg)  08/18/18 232 lb 4 oz (105.3 kg)  05/31/18 226 lb 2 oz (102.6 kg)     ------------------------------------------------------------------------ R shoulder pain: ongoing for ~1 month.  Does not recall any injury or trauma. Tylenol did not help but made her very sleepy.  Never had any difficulties liek this previosuly.  Pain is in axilla and R flank/under upper arm.  It is worse when she is cold.  No numbness, tingling, weakness.  Tried ice and rest without relief.  Insomnia: Well controlled. No longer needing to take trazodone.   No Known Allergies   Current Outpatient Medications:  .  aspirin 81 MG chewable tablet, Chew 81 mg by mouth daily., Disp: , Rfl:  .  atorvastatin (LIPITOR) 80 MG tablet, TAKE 1 TABLET BY MOUTH ONCE DAILY, Disp: 90 tablet, Rfl: 0 .  ENTRESTO 24-26 MG, TAKE 1 TABLET BY MOUTH TWICE DAILY, Disp: 180 tablet, Rfl: 1 .  fluticasone (FLONASE) 50 MCG/ACT nasal spray, Place 2 sprays into both nostrils as needed for allergies or rhinitis., Disp: , Rfl:  .  furosemide (LASIX) 40 MG tablet, TAKE 1 TABLET BY MOUTH THREE TIMES DAILY, Disp: 90 tablet, Rfl: 11 .  metoprolol succinate (TOPROL-XL) 50 MG 24 hr tablet, Take 1/2 tablet (25 mg) by mouth two times a day. Take with or immediately following a meal., Disp: 90 tablet, Rfl: 3 .  nitroGLYCERIN (NITROSTAT) 0.3 MG SL tablet, Place 1 tablet (0.3 mg total) under the tongue every 5 (five) minutes as needed for chest pain., Disp: 30 tablet, Rfl: 3 .  spironolactone (ALDACTONE) 25 MG tablet, TAKE 1 TABLET BY MOUTH ONCE DAILY, Disp: 90 tablet, Rfl: 0 .  traMADol (ULTRAM) 50 MG tablet, Take 1 tablet (50 mg  total) every 6 (six) hours as needed by mouth., Disp: 30 tablet, Rfl: 5 .  traZODone (DESYREL) 50 MG tablet, Take 0.5-1 tablets (25-50 mg total) by mouth at bedtime as needed for sleep., Disp: 30 tablet, Rfl: 3 .  warfarin (COUMADIN) 1 MG tablet, Take 1 tablet (1 mg total) by mouth as directed. (Patient taking differently: Take 1 mg by mouth every Tuesday, Thursday, and Saturday at 6 PM. ), Disp: 30  tablet, Rfl: 2 .  warfarin (COUMADIN) 10 MG tablet, Take 1 tablet (10 mg total) by mouth as directed., Disp: 30 tablet, Rfl: 0 .  warfarin (COUMADIN) 2.5 MG tablet, Take 8.5 mg PO daily on Mon, Wed, Fri, and 7.5 mg PO daily on Tues, Thurs, Sat, Sun, Disp: 90 tablet, Rfl: 1 .  warfarin (COUMADIN) 7.5 MG tablet, Take 8.5 mg PO daily on Mon, Wed, Fri, and 7.5 mg PO daily on Tues, Thurs, Sat, Sun (Patient taking differently: 7.5 mg daily at 6 PM. Take 8.5 mg PO daily on Mon, Wed, Fri, and 7.5 mg PO daily on Tues, Thurs, Sat, Sun), Disp: 90 tablet, Rfl: 1 .  diclofenac sodium (VOLTAREN) 1 % GEL, Apply 2 g topically 4 (four) times daily., Disp: 100 g, Rfl: 2  Review of Systems  Constitutional: Negative.   Respiratory: Negative.   Cardiovascular: Negative.   Musculoskeletal: Negative.     Social History   Tobacco Use  . Smoking status: Former Smoker    Packs/day: 1.00    Years: 4.00    Pack years: 4.00    Types: Cigarettes    Last attempt to quit: 1998    Years since quitting: 22.0  . Smokeless tobacco: Never Used  Substance Use Topics  . Alcohol use: No      Objective:   BP 122/75 (BP Location: Right Arm, Patient Position: Sitting, Cuff Size: Large)   Pulse 82   Temp 98.6 F (37 C) (Oral)   Wt 226 lb 9.6 oz (102.8 kg)   LMP  (LMP Unknown)   SpO2 99%   BMI 40.79 kg/m  Vitals:   09/08/18 1306  BP: 122/75  Pulse: 82  Temp: 98.6 F (37 C)  TempSrc: Oral  SpO2: 99%  Weight: 226 lb 9.6 oz (102.8 kg)     Physical Exam Vitals signs reviewed.  Constitutional:      General: She is not in acute distress.    Appearance: Normal appearance. She is well-developed. She is not diaphoretic.  HENT:     Head: Normocephalic and atraumatic.     Right Ear: External ear normal.     Left Ear: External ear normal.     Nose: Nose normal.     Mouth/Throat:     Mouth: Mucous membranes are moist.     Pharynx: Oropharynx is clear. No oropharyngeal exudate.  Eyes:     General: No scleral  icterus.    Conjunctiva/sclera: Conjunctivae normal.     Pupils: Pupils are equal, round, and reactive to light.  Neck:     Musculoskeletal: Neck supple.     Thyroid: No thyromegaly.  Cardiovascular:     Rate and Rhythm: Normal rate and regular rhythm.     Pulses: Normal pulses.     Heart sounds: Murmur (systolic mechanical click) present.  Pulmonary:     Effort: Pulmonary effort is normal. No respiratory distress.     Breath sounds: Normal breath sounds. No wheezing or rales.  Abdominal:     General: There is no distension.  Palpations: Abdomen is soft.     Tenderness: There is no abdominal tenderness.  Musculoskeletal:     Right lower leg: No edema.     Left lower leg: No edema.     Comments: R shoulder/axilla/flank: Shoulder ROM is intact, but some pain with internal rotation.  Rotator cuff strength is intact throughout.  Mild tenderness to palpation in bicipital groove as well as subacromial space.  Significant tenderness to palpation throughout axilla and under right upper arm with some mild bruising noted in the area.  No axillary lymphadenopathy palpable  Lymphadenopathy:     Cervical: No cervical adenopathy.  Skin:    General: Skin is warm and dry.     Capillary Refill: Capillary refill takes less than 2 seconds.     Findings: No rash.  Neurological:     Mental Status: She is alert and oriented to person, place, and time.  Psychiatric:        Mood and Affect: Mood normal.        Behavior: Behavior normal.        Thought Content: Thought content normal.      Labs obtained 08/18/18 from Cardiology show: Creatinine 1.06 which is stable, normal electrolytes.  LDL 58, total cholesterol 117, HDL 40, CBC normal with the exception of platelets of 80.  This was then repeated on 08/21/2018 and showed platelets of 167.    Assessment & Plan   Problem List Items Addressed This Visit      Cardiovascular and Mediastinum   HTN (hypertension) - Primary (Chronic)     Well-controlled Continue current meds Also followed by cardiology Reviewed recent labs Follow-up in 6 months        Other   Insomnia    Well-controlled Discussed sleep hygiene No longer needing trazodone      Morbid obesity ([place])    Discussed importance of healthy weight, diet, and exercise especially given her heart history      Hyperlipidemia LDL goal <70    Reviewed recent lipid panel with LDL at goal Continue atorvastatin 80 mg daily Also followed by cardiology Discussed diet and exercise      Axillary pain, right    Unclear etiology Patient does have some pain in her shoulder and pain with internal rotation suggestive of possible bursitis, but she does not have impingement symptoms on exam The majority of her pain and tenderness is in her axilla but there are no abnormalities on exam except for some mild bruising We will continue to treat with ice and topical NSAIDs versus topical lidocaine Discussed return precautions She will follow-up in 1 month if this is not resolved and we will consider further investigation and possible imaging          Return in about 6 months (around 03/09/2019) for CPE/AWV (after 7/10).   The entirety of the information documented in the History of Present Illness, Review of Systems and Physical Exam were personally obtained by me. Portions of this information were initially documented by Tiburcio Pea, CMA and reviewed by me for thoroughness and accuracy.    Virginia Crews, MD, MPH [facility] 09/08/2018 3:56 PM

## 2018-09-18 ENCOUNTER — Other Ambulatory Visit: Payer: Self-pay | Admitting: Physician Assistant

## 2018-09-18 NOTE — Telephone Encounter (Signed)
Please advise 

## 2018-09-19 ENCOUNTER — Ambulatory Visit
Admission: RE | Admit: 2018-09-19 | Discharge: 2018-09-19 | Disposition: A | Discharge status: Home / Self Care | Payer: Medicare HMO | Source: Ambulatory Visit | Attending: Radiation Oncology | Admitting: Radiation Oncology

## 2018-09-19 DIAGNOSIS — J342 Deviated nasal septum: Secondary | ICD-10-CM | POA: Diagnosis not present

## 2018-09-19 DIAGNOSIS — C3411 Malignant neoplasm of upper lobe, right bronchus or lung: Secondary | ICD-10-CM | POA: Insufficient documentation

## 2018-09-19 MED ORDER — IOPAMIDOL (ISOVUE-300) INJECTION 61%
75.0000 mL | Freq: Once | INTRAVENOUS | Status: AC | PRN
  Administered 2018-09-19: 75 mL via INTRAVENOUS

## 2018-09-20 ENCOUNTER — Ambulatory Visit (INDEPENDENT_AMBULATORY_CARE_PROVIDER_SITE_OTHER): Payer: Medicare HMO

## 2018-09-20 DIAGNOSIS — Z952 Presence of prosthetic heart valve: Secondary | ICD-10-CM | POA: Diagnosis not present

## 2018-09-20 LAB — POCT INR
INR: 3 (ref 2.0–3.0)
PT: 36.2

## 2018-09-20 NOTE — Patient Instructions (Signed)
Description   Dx: Mechanical Valve  Continue 7.5mg  daily except 10 mg on Friday.  Recheck in 4 (two) week.

## 2018-09-26 ENCOUNTER — Ambulatory Visit (INDEPENDENT_AMBULATORY_CARE_PROVIDER_SITE_OTHER): Payer: Medicare HMO

## 2018-09-26 DIAGNOSIS — I428 Other cardiomyopathies: Secondary | ICD-10-CM

## 2018-09-26 DIAGNOSIS — I5022 Chronic systolic (congestive) heart failure: Secondary | ICD-10-CM

## 2018-09-27 ENCOUNTER — Encounter: Payer: Self-pay | Admitting: Radiation Oncology

## 2018-09-27 ENCOUNTER — Other Ambulatory Visit: Payer: Self-pay

## 2018-09-27 ENCOUNTER — Other Ambulatory Visit: Payer: Self-pay | Admitting: *Deleted

## 2018-09-27 ENCOUNTER — Ambulatory Visit
Admission: RE | Admit: 2018-09-27 | Discharge: 2018-09-27 | Disposition: A | Discharge status: Home / Self Care | Payer: Medicare HMO | Source: Ambulatory Visit | Attending: Radiation Oncology | Admitting: Radiation Oncology

## 2018-09-27 VITALS — BP 126/71 | HR 89 | Temp 96.0°F | Resp 18 | Wt 230.3 lb

## 2018-09-27 DIAGNOSIS — C3411 Malignant neoplasm of upper lobe, right bronchus or lung: Secondary | ICD-10-CM

## 2018-09-27 DIAGNOSIS — Z87891 Personal history of nicotine dependence: Secondary | ICD-10-CM | POA: Diagnosis not present

## 2018-09-27 NOTE — Progress Notes (Signed)
Remote ICD transmission.   

## 2018-09-27 NOTE — Progress Notes (Signed)
Radiation Oncology Follow up Note  Name: Vanessa Carlson   Date:   09/27/2018 MRN:  [id number] DOB: [birth year]    This 58 y.o. 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 Seen today in routine follow-up she is clinically doing well. She has a slight nonproductive cough. No marked shortness of breath or dyspnea on exertion..she recently had a CT scan which I have reviewed showing interval enlargement at least 2 satellite nodules in the right upper lobe adjacent to the prior treated lesion. These are worrisome for pulmonary metastasis.  COMPLICATIONS OF TREATMENT: none  FOLLOW UP COMPLIANCE: keeps appointments   PHYSICAL EXAM:  BP 126/71   Pulse 89   Temp (!) 96 F (35.6 C)   Resp 18   Wt 230 lb 4.3 oz (104.5 kg)   LMP  (LMP Unknown)   BMI 41.45 kg/m  Well-developed well-nourished patient in NAD. HEENT reveals PERLA, EOMI, discs not visualized.  Oral cavity is clear. No oral mucosal lesions are identified. Neck is clear without evidence of cervical or supraclavicular adenopathy. Lungs are clear to A&P. Cardiac examination is essentially unremarkable with regular rate and rhythm without murmur rub or thrill. Abdomen is benign with no organomegaly or masses noted. Motor sensory and DTR levels are equal and symmetric in the upper and lower extremities. Cranial nerves II through XII are grossly intact. Proprioception is intact. No peripheral adenopathy or edema is identified. No motor or sensory levels are noted. Crude visual fields are within normal range.  RADIOLOGY RESULTS: CT scan reviewed and compatible above-stated findings. PET CT scan ordered  PLAN: I have ordered a PET CT scan to confirm hypermetabolic activity in the study  to satellite lesions and possibly identified a primary lesion. Should these be areas be hypermetabolic we'll present her case at tumor conference and May obtain again CT-guided biopsy. I can always retreat these areas with SB RT since both nodules are adjacent to each other and would be included in a single treatment field. All of this was explained to the patient. I have set up a follow-up appointment after her PET CT scan will make further recommendations at that time.  I would like to take this opportunity to thank you for allowing me to participate in the care of your patient.Noreene Filbert, MD

## 2018-09-28 ENCOUNTER — Ambulatory Visit (INDEPENDENT_AMBULATORY_CARE_PROVIDER_SITE_OTHER): Payer: Medicare HMO

## 2018-09-28 DIAGNOSIS — I5042 Chronic combined systolic (congestive) and diastolic (congestive) heart failure: Secondary | ICD-10-CM | POA: Diagnosis not present

## 2018-09-28 DIAGNOSIS — Z95810 Presence of automatic (implantable) cardiac defibrillator: Secondary | ICD-10-CM | POA: Diagnosis not present

## 2018-09-28 LAB — CUP PACEART REMOTE DEVICE CHECK
Battery Remaining Longevity: 111 mo
Battery Voltage: 3.01 V
Brady Statistic RV Percent Paced: 0 %
Date Time Interrogation Session: 20200128072604
HighPow Impedance: 77 Ohm
Implantable Lead Implant Date: 20160802
Implantable Lead Location: 753860
Lead Channel Impedance Value: 779 Ohm
Lead Channel Impedance Value: 874 Ohm
Lead Channel Pacing Threshold Amplitude: 0.75 V
Lead Channel Sensing Intrinsic Amplitude: 12.375 mV
Lead Channel Sensing Intrinsic Amplitude: 12.375 mV
Lead Channel Setting Pacing Amplitude: 2 V
Lead Channel Setting Pacing Pulse Width: 0.4 ms
MDC IDC MSMT LEADCHNL RV PACING THRESHOLD PULSEWIDTH: 0.4 ms
MDC IDC PG IMPLANT DT: 20160802
MDC IDC SET LEADCHNL RV SENSING SENSITIVITY: 0.3 mV

## 2018-09-29 ENCOUNTER — Telehealth: Payer: Self-pay

## 2018-09-29 NOTE — Telephone Encounter (Signed)
Remote ICM transmission received.  Attempted call to patient regarding ICM remote transmission and left detailed message, per DPR, with next ICM remote transmission date of 10/31/2018.  Advised to return call for any fluid symptoms or questions.

## 2018-09-29 NOTE — Progress Notes (Signed)
EPIC Encounter for ICM Monitoring  Patient Name: Vanessa Carlson is a 58 y.o. female Date: 09/29/2018 Primary Care Physican: Virginia Crews, MD Primary Cardiologist: Carlis Abbott, NP HF Electrophysiologist: Caryl Comes Last Weight:224lbs Today's Weight: unknown                                                   Attempted call to patient and unable to reach.  Left detailed message per DPR regarding transmission. Transmission reviewed.    Thoracic impedance normal.   Prescribed: Furosemide 40 mg 1 tablet 3 times daily.   Labs: 08/18/2018 Creatinine 1.06, BUN 10, Potassium 3.9, Sodium 142, eGFR 58-67 03/22/2018 Creatinine 0.89, BUN 14, Potassium 3.8, Sodium 141, eGFR >60 12/21/2017 Creatinine 0.91, BUN 9, Potassium 3.9, Sodium 143, eGFR 70-81 08/31/2017 Creatinine 1.06, BUN 14, Potassium 3.9, Sodium 139, EGFR 57->60  Recommendations: Left voice mail with ICM number and encouraged to call if experiencing any fluid symptoms.  Follow-up plan: ICM clinic phone appointment on 10/31/2018.      Copy of ICM check sent to Dr. Caryl Comes.   3 month ICM trend: 09/26/2018    1 Year ICM trend:       [name], RN 09/29/2018 7:43 AM

## 2018-10-05 ENCOUNTER — Ambulatory Visit
Admission: RE | Admit: 2018-10-05 | Discharge: 2018-10-05 | Disposition: A | Discharge status: Home / Self Care | Payer: Medicare HMO | Source: Ambulatory Visit | Attending: Radiation Oncology | Admitting: Radiation Oncology

## 2018-10-05 DIAGNOSIS — R911 Solitary pulmonary nodule: Secondary | ICD-10-CM | POA: Diagnosis not present

## 2018-10-05 DIAGNOSIS — C3411 Malignant neoplasm of upper lobe, right bronchus or lung: Secondary | ICD-10-CM | POA: Diagnosis not present

## 2018-10-05 LAB — GLUCOSE, CAPILLARY: Glucose-Capillary: 119 mg/dL — ABNORMAL HIGH (ref 70–99)

## 2018-10-05 MED ORDER — FLUDEOXYGLUCOSE F - 18 (FDG) INJECTION
12.4300 | Freq: Once | INTRAVENOUS | Status: AC | PRN
  Administered 2018-10-05: 12.43 via INTRAVENOUS

## 2018-10-09 ENCOUNTER — Ambulatory Visit: Payer: Medicare HMO | Admitting: Radiation Oncology

## 2018-10-10 ENCOUNTER — Encounter: Payer: Self-pay | Admitting: Radiation Oncology

## 2018-10-10 ENCOUNTER — Ambulatory Visit
Admission: RE | Admit: 2018-10-10 | Discharge: 2018-10-10 | Disposition: A | Discharge status: Home / Self Care | Payer: Medicare HMO | Source: Ambulatory Visit | Attending: Radiation Oncology | Admitting: Radiation Oncology

## 2018-10-10 ENCOUNTER — Other Ambulatory Visit: Payer: Self-pay

## 2018-10-10 VITALS — BP 129/81 | HR 81 | Temp 96.6°F | Resp 18 | Wt 230.8 lb

## 2018-10-10 DIAGNOSIS — C3411 Malignant neoplasm of upper lobe, right bronchus or lung: Secondary | ICD-10-CM | POA: Diagnosis not present

## 2018-10-10 DIAGNOSIS — Z87891 Personal history of nicotine dependence: Secondary | ICD-10-CM | POA: Diagnosis not present

## 2018-10-10 NOTE — Progress Notes (Signed)
Radiation Oncology Follow up Note  Name: Vanessa Carlson   Date:   10/10/2018 MRN:  [id number] DOB: 1961-05-27    This 58 y.o. female presents to the clinic today for follow-up after PET CT scan for new right upper lobe pulmonary nodules.  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 Recent follow-up CT scan showed several other nodules in the right upper lobe. I performed a PET CT scan to the nodules were hypermetabolic consistent with either metastatic disease or new primary lung cancers. She continues to be asymptomatic. She has a slight nonproductive cough no chest pain or discomfort.Marland KitchenPET CT scan did not demonstrate any evidence of other primary tumor or other metastatic disease.  COMPLICATIONS OF TREATMENT: none  FOLLOW UP COMPLIANCE: keeps appointments   PHYSICAL EXAM:  BP 129/81 (BP Location: Left Arm, Patient Position: Sitting)   Pulse 81   Temp (!) 96.6 F (35.9 C) (Tympanic)   Resp 18   Wt 230 lb 13.2 oz (104.7 kg)   LMP  (LMP Unknown)   BMI 41.55 kg/m  Well-developed well-nourished patient in NAD. HEENT reveals PERLA, EOMI, discs not visualized.  Oral cavity is clear. No oral mucosal lesions are identified. Neck is clear without evidence of cervical or supraclavicular adenopathy. Lungs are clear to A&P. Cardiac examination is essentially unremarkable with regular rate and rhythm without murmur rub or thrill. Abdomen is benign with no organomegaly or masses noted. Motor sensory and DTR levels are equal and symmetric in the upper and lower extremities. Cranial nerves II through XII are grossly intact. Proprioception is intact. No peripheral adenopathy or edema is identified. No motor or sensory levels are noted. Crude visual fields are within normal range.  RADIOLOGY RESULTS: PET CT scan reviewed and compatible with the above-stated  findings  PLAN: at this time I to ahead with SB RT and retreat in close proximity to her prior radiation fields.I think I can Compas all 3 nodules within a single SB RT field. Risks and benefits of treatment including productive possible production of cough fatigue and possible potential overlap of previous radiated sites all were discussed in detail with the patient and her son. I've alsoreferring the patient to medical oncology for opinion. Patient does have a low ejection fraction and has been declined for surgery in the past. I first set up and ordered CT simulation with 4-dimensional study and motion restriction protocol.  I would like to take this opportunity to thank you for allowing me to participate in the care of your patient.Noreene Filbert, MD

## 2018-10-14 ENCOUNTER — Other Ambulatory Visit: Payer: Self-pay | Admitting: Family

## 2018-10-16 ENCOUNTER — Other Ambulatory Visit: Payer: Self-pay

## 2018-10-16 ENCOUNTER — Inpatient Hospital Stay (HOSPITAL_BASED_OUTPATIENT_CLINIC_OR_DEPARTMENT_OTHER): Payer: Medicare HMO | Admitting: Oncology

## 2018-10-16 VITALS — BP 117/72 | HR 81 | Temp 96.1°F | Resp 18 | Wt 231.0 lb

## 2018-10-16 DIAGNOSIS — R5383 Other fatigue: Secondary | ICD-10-CM | POA: Insufficient documentation

## 2018-10-16 DIAGNOSIS — I252 Old myocardial infarction: Secondary | ICD-10-CM | POA: Diagnosis not present

## 2018-10-16 DIAGNOSIS — Z87891 Personal history of nicotine dependence: Secondary | ICD-10-CM | POA: Insufficient documentation

## 2018-10-16 DIAGNOSIS — I5042 Chronic combined systolic (congestive) and diastolic (congestive) heart failure: Secondary | ICD-10-CM | POA: Insufficient documentation

## 2018-10-16 DIAGNOSIS — Z8541 Personal history of malignant neoplasm of cervix uteri: Secondary | ICD-10-CM | POA: Diagnosis not present

## 2018-10-16 DIAGNOSIS — Z923 Personal history of irradiation: Secondary | ICD-10-CM | POA: Insufficient documentation

## 2018-10-16 DIAGNOSIS — I110 Hypertensive heart disease with heart failure: Secondary | ICD-10-CM | POA: Diagnosis not present

## 2018-10-16 DIAGNOSIS — C3411 Malignant neoplasm of upper lobe, right bronchus or lung: Secondary | ICD-10-CM

## 2018-10-16 DIAGNOSIS — Z510 Encounter for antineoplastic radiation therapy: Secondary | ICD-10-CM | POA: Diagnosis not present

## 2018-10-16 DIAGNOSIS — Z79899 Other long term (current) drug therapy: Secondary | ICD-10-CM | POA: Insufficient documentation

## 2018-10-16 DIAGNOSIS — Z7982 Long term (current) use of aspirin: Secondary | ICD-10-CM | POA: Insufficient documentation

## 2018-10-16 DIAGNOSIS — Z90710 Acquired absence of both cervix and uterus: Secondary | ICD-10-CM | POA: Insufficient documentation

## 2018-10-16 DIAGNOSIS — I700 Atherosclerosis of aorta: Secondary | ICD-10-CM | POA: Insufficient documentation

## 2018-10-16 DIAGNOSIS — I2510 Atherosclerotic heart disease of native coronary artery without angina pectoris: Secondary | ICD-10-CM | POA: Diagnosis not present

## 2018-10-16 DIAGNOSIS — I255 Ischemic cardiomyopathy: Secondary | ICD-10-CM | POA: Diagnosis not present

## 2018-10-16 NOTE — Progress Notes (Signed)
Here for follow up. Overall feels " great "  Intermittent sob -pulse ox 99% on RA today.

## 2018-10-17 ENCOUNTER — Encounter: Payer: Self-pay | Admitting: Oncology

## 2018-10-17 ENCOUNTER — Ambulatory Visit
Admission: RE | Admit: 2018-10-17 | Discharge: 2018-10-17 | Disposition: A | Discharge status: Home / Self Care | Payer: Medicare HMO | Source: Ambulatory Visit | Attending: Radiation Oncology | Admitting: Radiation Oncology

## 2018-10-17 DIAGNOSIS — I255 Ischemic cardiomyopathy: Secondary | ICD-10-CM | POA: Diagnosis not present

## 2018-10-17 DIAGNOSIS — Z510 Encounter for antineoplastic radiation therapy: Secondary | ICD-10-CM | POA: Diagnosis not present

## 2018-10-17 DIAGNOSIS — R5383 Other fatigue: Secondary | ICD-10-CM | POA: Diagnosis not present

## 2018-10-17 DIAGNOSIS — I700 Atherosclerosis of aorta: Secondary | ICD-10-CM | POA: Diagnosis not present

## 2018-10-17 DIAGNOSIS — I110 Hypertensive heart disease with heart failure: Secondary | ICD-10-CM | POA: Diagnosis not present

## 2018-10-17 DIAGNOSIS — I2510 Atherosclerotic heart disease of native coronary artery without angina pectoris: Secondary | ICD-10-CM | POA: Diagnosis not present

## 2018-10-17 DIAGNOSIS — C3411 Malignant neoplasm of upper lobe, right bronchus or lung: Secondary | ICD-10-CM | POA: Diagnosis not present

## 2018-10-17 DIAGNOSIS — I252 Old myocardial infarction: Secondary | ICD-10-CM | POA: Diagnosis not present

## 2018-10-17 DIAGNOSIS — Z87891 Personal history of nicotine dependence: Secondary | ICD-10-CM | POA: Diagnosis not present

## 2018-10-17 DIAGNOSIS — I5042 Chronic combined systolic (congestive) and diastolic (congestive) heart failure: Secondary | ICD-10-CM | POA: Diagnosis not present

## 2018-10-17 NOTE — Progress Notes (Signed)
Hematology/Oncology Consult note [facility]  [phone] [phone]  Patient Care Team: Virginia Crews, MD as PCP - General (Family Medicine) End, Harrell Gave, MD as PCP - Cardiology (Cardiology) Telford Nab, RN as Registered Nurse Nestor Lewandowsky, MD as Referring Physician (Cardiothoracic Surgery) Schuyler Amor, MD as Attending Physician (Emergency Medicine)   Name of the patient: Vanessa Carlson  [id number]  [birth year]   Date of visit: 10/17/18  Diagnosis-recurrent lung cancer  Chief complaint/ Reason for visit-routine follow-up of lung cancer  Heme/Onc history: Patient is a 58 year old female with a past medical history significant for CAD, ischemic cardiomyopathy status post AICD placement, rheumatic heart disease status post mechanical mitral valve replacement on Coumadin.She was found to have a right upper lobe lung mass 2.5 cm that was hypermetabolic on PET CT scan in January 2019.  No evidence of nodal involvement.  She was not deemed to be a candidate for surgery and underwent SBRT to this lesion.  This was biopsy-proven adenocarcinoma with acinar and solid morphologies.  She then had a CT scan in January 2020 which showed enlarging satellite right upper lobe lung nodules.  This was followed by a PET CT scan which showed right upper lobe lung nodules 2 of them were hypermetabolic with an SUV of 8.7 was also a thyroid nodule approximately 4 mm in size which was not definitely hypermetabolic.  Patient was seen by Dr. Donella Stade following this and plan was to give SBRT for 5 fractions encompassing all these 3 nodules.  Patient has not had any systemic chemotherapy for her lung cancer so far  Interval history-patient currently reports doing well.  She does have some chronic fatigue and exertional shortness of breath but denies other complaints  ECOG PS- 1 Pain scale- 0   Review of systems- Review of Systems  Constitutional:  Positive for malaise/fatigue. Negative for chills, fever and weight loss.  HENT: Negative for congestion, ear discharge and nosebleeds.   Eyes: Negative for blurred vision.  Respiratory: Positive for shortness of breath. Negative for cough, hemoptysis, sputum production and wheezing.   Cardiovascular: Negative for chest pain, palpitations, orthopnea and claudication.  Gastrointestinal: Negative for abdominal pain, blood in stool, constipation, diarrhea, heartburn, melena, nausea and vomiting.  Genitourinary: Negative for dysuria, flank pain, frequency, hematuria and urgency.  Musculoskeletal: Negative for back pain, joint pain and myalgias.  Skin: Negative for rash.  Neurological: Negative for dizziness, tingling, focal weakness, seizures, weakness and headaches.  Endo/Heme/Allergies: Does not bruise/bleed easily.  Psychiatric/Behavioral: Negative for depression and suicidal ideas. The patient does not have insomnia.     No Known Allergies   Past Medical History:  Diagnosis Date  . AICD (automatic cardioverter/defibrillator) present   . Anterior myocardial infarction [facility])    a. 10/2014 - occluded LAD, complicated by cardiogenic shock-->Med Rx as interventional team was unable to open LAD.  [name] Cancer of upper lobe of right lung ([place]) 09/2017   radiation therapy right side  . Cervical cancer ([place])    in remission for ~20 yrs, s/p hysterectomy  . Chronic combined systolic and diastolic CHF (congestive heart failure) ([place])    a. 10/2015 Echo: EF 20-25%, sev diast dysfxn, mildly reduced RV fxn. Nl MV prosthesis.  . Coronary artery disease    a. 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  . Essential hypertension   . Ischemic cardiomyopathy    a. 03/2015 s/p MDT single  lead AICD;  b. 10/2015 Echo: EF 20-25%.  . Mass of upper lobe of right lung    a. noted on CXR & CT 08/2017 w/ abnl PET CT.  [name] Mitral valve  disease    a. 2006 s/p MVR w/ 60mm SJM bileaflet mechanical valve-->chronic coumadin/ASA.  [name] Personal history of radiation therapy    f/u lung ca     Past Surgical History:  Procedure Laterality Date  . APPENDECTOMY  1998  . BREAST EXCISIONAL BIOPSY Left 80s   benign  . CARDIAC CATHETERIZATION    . CARDIAC DEFIBRILLATOR PLACEMENT    . RADICAL HYSTERECTOMY  1998  . SHOULDER SURGERY Bilateral    rotator cuff tears  . VALVE REPLACEMENT     Mitral valve; 27 mm St. Jude bileaflet valve    Social History   Socioeconomic History  . Marital status: Married    Spouse name: Tosin  . Number of children: 2  . Years of education: 76  . 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 Financial resource strain: Not on file  . Food insecurity:    Worry: Not on file    Inability: Not on file  . Transportation needs:    Medical: Not on file    Non-medical: Not on file  Tobacco Use  . Smoking status: Former Smoker    Packs/day: 1.00    Years: 4.00    Pack years: 4.00    Types: Cigarettes    Last attempt to quit: 1998    Years since quitting: 22.1  . Smokeless tobacco: Never Used  Substance and Sexual Activity  . Alcohol use: No  . Drug use: No  . Sexual activity: Yes    Partners: Male    Birth control/protection: Surgical  Lifestyle  . Physical activity:    Days per week: Not on file    Minutes per session: Not on file  . Stress: Not on file  Relationships  . Social connections:    Talks on phone: Not on file    Gets together: Not on file    Attends religious service: Not on file    Active member of club or organization: Not on file    Attends meetings of clubs or organizations: Not on file    Relationship status: Not on file  . Intimate partner violence:    Fear of current or ex partner: Not on file    Emotionally abused: Not on file    Physically abused: Not on file    Forced sexual activity:  Not on file  Other Topics Concern  . Not on file  Social History Narrative  . Not on file    Family History  Problem Relation Age of Onset  . Heart attack Mother   . Hypertension Mother   . Diabetes Mother   . Emphysema Father        smoker  . Diabetes Maternal Grandmother   . Kidney disease Maternal Grandmother   . Breast cancer Maternal Aunt   . Breast cancer Maternal Aunt   . Colon cancer Maternal Uncle      Current Outpatient Medications:  .  aspirin 81 MG chewable tablet, Chew 81 mg by mouth daily., Disp: , Rfl:  .  diclofenac sodium (VOLTAREN) 1 % GEL, Apply 2 g topically 4 (four) times daily., Disp: 100 g, Rfl: 2 .  ENTRESTO 24-26 MG, TAKE 1 TABLET BY MOUTH TWICE  DAILY, Disp: 180 tablet, Rfl: 3 .  fluticasone (FLONASE) 50 MCG/ACT nasal spray, Place 2 sprays into both nostrils as needed for allergies or rhinitis., Disp: , Rfl:  .  furosemide (LASIX) 40 MG tablet, TAKE 1 TABLET BY MOUTH THREE TIMES DAILY, Disp: 90 tablet, Rfl: 11 .  metoprolol succinate (TOPROL-XL) 50 MG 24 hr tablet, Take 1/2 tablet (25 mg) by mouth two times a day. Take with or immediately following a meal., Disp: 90 tablet, Rfl: 3 .  spironolactone (ALDACTONE) 25 MG tablet, TAKE 1 TABLET BY MOUTH ONCE DAILY, Disp: 90 tablet, Rfl: 0 .  traZODone (DESYREL) 50 MG tablet, Take 0.5-1 tablets (25-50 mg total) by mouth at bedtime as needed for sleep., Disp: 30 tablet, Rfl: 3 .  warfarin (COUMADIN) 10 MG tablet, Take 1 tablet (10 mg total) by mouth as directed. (Patient taking differently: Take 10 mg by mouth as directed. ), Disp: 30 tablet, Rfl: 0 .  warfarin (COUMADIN) 7.5 MG tablet, Take 7.5 mg by mouth daily., Disp: , Rfl:  .  nitroGLYCERIN (NITROSTAT) 0.3 MG SL tablet, Place 1 tablet (0.3 mg total) under the tongue every 5 (five) minutes as needed for chest pain. (Patient not taking: Reported on 10/16/2018), Disp: 30 tablet, Rfl: 3 .  traMADol (ULTRAM) 50 MG tablet, Take 1 tablet (50 mg total) every 6 (six)  hours as needed by mouth. (Patient not taking: Reported on 10/16/2018), Disp: 30 tablet, Rfl: 5  Physical exam:  Vitals:   10/16/18 1113  BP: 117/72  Pulse: 81  Resp: 18  Temp: (!) 96.1 F (35.6 C)  TempSrc: Tympanic  Weight: 231 lb (104.8 kg)   Physical Exam Constitutional:      General: She is not in acute distress. HENT:     Head: Normocephalic and atraumatic.  Eyes:     Pupils: Pupils are equal, round, and reactive to light.  Neck:     Musculoskeletal: Normal range of motion.  Cardiovascular:     Rate and Rhythm: Normal rate and regular rhythm.     Heart sounds: Normal heart sounds.  Pulmonary:     Effort: Pulmonary effort is normal.     Breath sounds: Normal breath sounds.  Abdominal:     General: Bowel sounds are normal.     Palpations: Abdomen is soft.  Musculoskeletal:     Comments: Trace bilateral edema  Skin:    General: Skin is warm and dry.  Neurological:     Mental Status: She is alert and oriented to person, place, and time.      CMP Latest Ref Rng & Units 08/18/2018  Glucose 65 - 99 mg/dL 133(H)  BUN 6 - 24 mg/dL 10  Creatinine 0.57 - 1.00 mg/dL 1.06(H)  Sodium 134 - 144 mmol/L 142  Potassium 3.5 - 5.2 mmol/L 3.9  Chloride 96 - 106 mmol/L 106  CO2 20 - 29 mmol/L 23  Calcium 8.7 - 10.2 mg/dL 9.2  Total Protein 6.0 - 8.5 g/dL 7.0  Total Bilirubin 0.0 - 1.2 mg/dL 0.6  Alkaline Phos 39 - 117 IU/L 89  AST 0 - 40 IU/L 20  ALT 0 - 32 IU/L 16   CBC Latest Ref Rng & Units 08/21/2018  WBC 4.0 - 10.5 K/uL 5.4  Hemoglobin 12.0 - 15.0 g/dL 12.5  Hematocrit 36.0 - 46.0 % 39.0  Platelets 150 - 400 K/uL 167    No images are attached to the encounter.  Ct Chest W Contrast  Result Date: 09/19/2018 CLINICAL DATA:  Stage I right upper lobe adenocarcinoma status post SBRT. Radiation therapy completed 9 months ago. EXAM: CT CHEST WITH CONTRAST TECHNIQUE: Multidetector CT imaging of the chest was performed during intravenous contrast administration. CONTRAST:   29mL ISOVUE-300 IOPAMIDOL (ISOVUE-300) INJECTION 61% COMPARISON:  Chest CT 08/31/2017 and 03/22/2018.  PET-CT 09/07/2017. FINDINGS: Cardiovascular: Atherosclerosis of the aorta, great vessels and coronary arteries again noted. No acute vascular findings are seen. Mitral valve prosthesis and left subclavian pacemaker leads are unchanged in position. There is stable mild cardiomegaly without significant pericardial effusion. Mediastinum/Nodes: There are no enlarged mediastinal, hilar or axillary lymph nodes.8 mm right paratracheal node on image 31/2 is similar to the previous study. There is stable nodularity of the left thyroid lobe with an exophytic component posteriorly measuring 14 mm on image 9/2. The trachea and esophagus appear unremarkable. Lungs/Pleura: There is no pleural effusion or pneumothorax. Mild centrilobular emphysema again noted. Radiation changes in the right upper lobe with volume loss and linear opacities are similar to the most recent study. The original right upper lobe lesion is partly obscured by these radiation changes although likely corresponds with an enlarging nodule measuring 10 x 11 mm on image 35/3 (previously 7 x 8 mm, remeasured). There are at least 2 enlarging satellite nodules in the right upper lobe, measuring up to 10 mm and 5 mm on image 31/3. No other enlarging pulmonary nodules are identified. Upper abdomen: Stable hepatic hyperenhancement adjacent to the falciform ligament, likely an incidental vascular phenomenon. Stable cortical scarring in the upper pole of the right kidney. No acute findings. Musculoskeletal/Chest wall: There is no chest wall mass or suspicious osseous finding. IMPRESSION: 1. Interval enlargement of at least 2 satellite nodules in the right upper lobe adjacent to the treated lesion, worrisome for pulmonary metastases. The original lesion in the right upper lobe is partly obscured by adjacent radiation changes, although may be larger as well. 2. No other  evidence of thoracic metastatic disease. 3. Right upper lobe radiation changes. 4. Coronary and Aortic Atherosclerosis (ICD10-I70.0). Emphysema (ICD10-J43.9). Electronically Signed   By: Richardean Sale M.D.   On: 09/19/2018 12:52   Nm Pet Image Restag (ps) Skull Base To Thigh  Result Date: 10/05/2018 CLINICAL DATA:  Subsequent treatment strategy for right upper lobe non-small cell lung cancer. EXAM: NUCLEAR MEDICINE PET SKULL BASE TO THIGH TECHNIQUE: 12.43 mCi F-18 FDG was injected intravenously. Full-ring PET imaging was performed from the skull base to thigh after the radiotracer. CT data was obtained and used for attenuation correction and anatomic localization. Fasting blood glucose: 119 mg/dl COMPARISON:  PET-CT 09/07/2017 and CT chest 09/19/2018 FINDINGS: Mediastinal blood pool activity: SUV max 3.53 NECK: No hypermetabolic lymph nodes in the neck. Incidental CT findings: none CHEST: 2 adjacent peripheral right upper lobe lung nodules are again demonstrated. The more posterior and superior nodule measures 10 mm in the slightly more inferior and anterior nodule measures 8 mm. These are hypermetabolic with SUV max of 5.63. A third smaller more superior and anterior nodule measures 4 mm but is not definitely hypermetabolic. Extensive radiation changes surrounding the patient's original pulmonary lesion with mild diffuse hypermetabolism but no findings for recurrent tumor in that area. A few small scattered mediastinal lymph nodes but no hypermetabolism. No worrisome hypermetabolic left lung nodules. Incidental CT findings: Stable cardiac enlargement and age advanced aortic and coronary artery calcifications. Prosthetic mitral valve is noted. ABDOMEN/PELVIS: No findings to suggest abdominal/pelvic metastatic disease. No hepatic or adrenal gland metastasis. No abdominal or pelvic lymphadenopathy. Incidental  CT findings: Stable advanced atherosclerotic calcifications involving the aorta and iliac arteries.  Stable surgical changes in the pelvis. SKELETON: No findings suspicious for osseous metastatic disease. Incidental CT findings: none IMPRESSION: 1. Three peripheral right upper lobe pulmonary nodules as demonstrated on the recent chest CT. The 2 larger nodules are hypermetabolic and consistent with metastatic disease. The third lesion only measures 4 mm but is suspicious for a metastasis also. 2. Extensive radiation changes surrounding the patient's original right upper lobe lung lesion without focal hypermetabolism to suggest recurrent tumor in this area. 3. No findings for abdominal/pelvic metastatic disease or osseous metastatic disease. Electronically Signed   By: Marijo Sanes M.D.   On: 10/05/2018 17:25     Assessment and plan- Patient is a 58 y.o. female with history of stage I adenocarcinoma of the right upper lobe of the lung status post SBRT in the past now with 2 enlarging lung nodules as well as a third satellite nodule in its vicinity  I have reviewed patient's CT chest images and PET/CT scan independently and discussed findings with the patient.  I will discuss her scans at the tumor board this week.  Since the plan is to give SBRT to 3 of these lung nodules I am in favor of repeating a CT scan 3 months from now and if there is any further growth in her lung nodules I will consider systemic chemotherapy or immunotherapy at that time.  I will call the patient after we discussed her case at tumor board and coordinate her appointments accordingly   Visit Diagnosis 1. Malignant neoplasm of upper lobe of right lung [facility])      Dr. Randa Evens, MD, MPH [facility] [phone] 10/17/2018 2:10 PM

## 2018-10-18 ENCOUNTER — Telehealth: Payer: Self-pay | Admitting: *Deleted

## 2018-10-18 ENCOUNTER — Ambulatory Visit: Payer: Self-pay

## 2018-10-18 NOTE — Telephone Encounter (Signed)
Received Pacemaker/ICD Form from [facility] at [facility] for patient to receive radiation. Form faxed to device clinic for completion.  Then will need to be faxed back to [phone].

## 2018-10-19 ENCOUNTER — Inpatient Hospital Stay: Payer: Medicare HMO | Attending: Oncology

## 2018-10-19 ENCOUNTER — Other Ambulatory Visit: Payer: Self-pay | Admitting: Oncology

## 2018-10-19 ENCOUNTER — Telehealth: Payer: Self-pay | Admitting: *Deleted

## 2018-10-19 DIAGNOSIS — C3411 Malignant neoplasm of upper lobe, right bronchus or lung: Secondary | ICD-10-CM

## 2018-10-19 NOTE — Progress Notes (Signed)
Tumor Board Documentation  Vanessa Carlson was presented by Dr Janese Banks at our Tumor Board on 10/19/2018, which included representatives from medical oncology, radiation oncology, surgical, radiology, pathology, navigation, internal medicine, research, palliative care, pulmonology.  Vanessa Carlson currently presents as a current patient, for [place], for discussion, for progression with history of the following treatments: adjuvant radiation.  Additionally, we reviewed previous medical and familial history, history of present illness, and recent lab results along with all available histopathologic and imaging studies. The tumor board considered available treatment options and made the following recommendations: Active surveillance, Biopsy, Radiation therapy (primary modality) CT in 3 mnths, if larger, biopsy .  The following procedures/referrals were also placed: No orders of the defined types were placed in this encounter.   Clinical Trial Status: not discussed   Staging used: AJCC Stage Group  National site-specific guidelines NCCN were discussed with respect to the case.  Tumor board is a meeting of clinicians from various specialty areas who evaluate and discuss patients for whom a multidisciplinary approach is being considered. Final determinations in the plan of care are those of the provider(s). The responsibility for follow up of recommendations given during tumor board is that of the provider.   Today's extended care, comprehensive team conference, Vanessa Carlson was not present for the discussion and was not examined.   Multidisciplinary Tumor Board is a multidisciplinary case peer review process.  Decisions discussed in the Multidisciplinary Tumor Board reflect the opinions of the specialists present at the conference without having examined the patient.  Ultimately, treatment and diagnostic decisions rest with the primary provider(s) and the patient.

## 2018-10-19 NOTE — Telephone Encounter (Signed)
Called patient and let her know that her case was discussed at the tumor board today and it was recommended for repeat CT of the chest in 3 months.  We put an order for a CT chest in 3 months with dates and times given to the patient over the phone on her voicemail as well as mailing out the appointment dates and times with addresses on it and left my number if she needs to call me back.

## 2018-10-20 DIAGNOSIS — I5042 Chronic combined systolic (congestive) and diastolic (congestive) heart failure: Secondary | ICD-10-CM | POA: Diagnosis not present

## 2018-10-20 DIAGNOSIS — I2510 Atherosclerotic heart disease of native coronary artery without angina pectoris: Secondary | ICD-10-CM | POA: Diagnosis not present

## 2018-10-20 DIAGNOSIS — I252 Old myocardial infarction: Secondary | ICD-10-CM | POA: Diagnosis not present

## 2018-10-20 DIAGNOSIS — Z510 Encounter for antineoplastic radiation therapy: Secondary | ICD-10-CM | POA: Diagnosis not present

## 2018-10-20 DIAGNOSIS — I110 Hypertensive heart disease with heart failure: Secondary | ICD-10-CM | POA: Diagnosis not present

## 2018-10-20 DIAGNOSIS — I700 Atherosclerosis of aorta: Secondary | ICD-10-CM | POA: Diagnosis not present

## 2018-10-20 DIAGNOSIS — I255 Ischemic cardiomyopathy: Secondary | ICD-10-CM | POA: Diagnosis not present

## 2018-10-20 DIAGNOSIS — C3411 Malignant neoplasm of upper lobe, right bronchus or lung: Secondary | ICD-10-CM | POA: Diagnosis not present

## 2018-10-20 DIAGNOSIS — R5383 Other fatigue: Secondary | ICD-10-CM | POA: Diagnosis not present

## 2018-10-20 DIAGNOSIS — Z87891 Personal history of nicotine dependence: Secondary | ICD-10-CM | POA: Diagnosis not present

## 2018-10-20 NOTE — Telephone Encounter (Signed)
Form received for [facility].  Patient is over due for follow up with Dr Caryl Comes 05-2017.  Will review with Dr Caryl Comes on Tuesday when he in back in office.  [place] to inform them of this information.

## 2018-10-24 ENCOUNTER — Ambulatory Visit (INDEPENDENT_AMBULATORY_CARE_PROVIDER_SITE_OTHER): Payer: Medicare HMO

## 2018-10-24 DIAGNOSIS — Z952 Presence of prosthetic heart valve: Secondary | ICD-10-CM | POA: Diagnosis not present

## 2018-10-24 LAB — POCT INR
INR: 2.8 (ref 2.0–3.0)
PT: 33.8

## 2018-10-24 NOTE — Patient Instructions (Signed)
Description   Dx: Mechanical Valve  Continue 7.5mg  daily except 10 mg on Friday.  Recheck in 4 (four) week.

## 2018-10-25 ENCOUNTER — Encounter: Payer: Self-pay | Admitting: *Deleted

## 2018-10-25 NOTE — Telephone Encounter (Signed)
Opened in error

## 2018-10-25 NOTE — Telephone Encounter (Signed)
Per Dr. Caryl Comes, device will need to be evaluated in clinic prior to initiation of treatments. Spoke with patient, she is agreeable to overdue OV with Dr. Caryl Comes tomorrow, 10/26/18 at 11:00am at the [facility] office.   Form filled out with recommendations, faxed back to [facility]. Confirmation received.

## 2018-10-26 ENCOUNTER — Encounter: Payer: Self-pay | Admitting: Internal Medicine

## 2018-10-26 ENCOUNTER — Encounter: Payer: Medicare HMO | Admitting: Internal Medicine

## 2018-10-26 ENCOUNTER — Ambulatory Visit (INDEPENDENT_AMBULATORY_CARE_PROVIDER_SITE_OTHER): Payer: Medicare HMO | Admitting: Internal Medicine

## 2018-10-26 VITALS — BP 100/62 | HR 74 | Ht 62.0 in | Wt 231.0 lb

## 2018-10-26 DIAGNOSIS — Z95810 Presence of automatic (implantable) cardiac defibrillator: Secondary | ICD-10-CM

## 2018-10-26 DIAGNOSIS — I5042 Chronic combined systolic (congestive) and diastolic (congestive) heart failure: Secondary | ICD-10-CM | POA: Diagnosis not present

## 2018-10-26 DIAGNOSIS — I429 Cardiomyopathy, unspecified: Secondary | ICD-10-CM | POA: Diagnosis not present

## 2018-10-26 NOTE — Progress Notes (Incomplete)
ELECTROPHYSIOLOGY CONSULT NOTE  Patient ID: Vanessa Carlson, MRN: [id number], DOB/AGE: 1961/04/07 58 y.o. Admit date: (Not on file) Date of Consult: 10/26/2018  Primary Physician: Virginia Crews, MD Primary Cardiologist: new   Vanessa Carlson is being seen today for the evaluation of ICD  And cardiac careat the request of  Bacigalupo, Dionne Bucy, MD.   HPI Vanessa Carlson is a 58 y.o. female  Seen to establish ICD follow-up.  She has a complicated cardiac history. 2006 she underwent mitral valve replacement-bioprosthetic.3/16 she had an anterior wall MI; they were unable to open the LAD at the [facility] and she suffered cardiogenic shock and pericarditis.  she received an ICD Medtronic  DATE TEST    3/16    echo   EF 35 %   3/17    echo   EF 25 %          SHe has had no ICD shocks. She's had no syncope.  She has some peripheral edema. She has no nocturnal dyspnea or orthopnea. She has mild dyspnea on exertion but is able to climb a flight of stairs.  ***    Past Medical History:  Diagnosis Date   AICD (automatic cardioverter/defibrillator) present    Anterior myocardial infarction ([place])    a. 10/2014 - occluded LAD, complicated by cardiogenic shock-->Med Rx as interventional team was unable to open LAD.   Cancer of upper lobe of right lung ([place]) 09/2017   radiation therapy right side   Cervical cancer ([place])    in remission for ~20 yrs, s/p hysterectomy   Chronic combined systolic and diastolic CHF (congestive heart failure) ([place])    a. 10/2015 Echo: EF 20-25%, sev diast dysfxn, mildly reduced RV fxn. Nl MV prosthesis.   Coronary artery disease    a. 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   Essential hypertension    Ischemic cardiomyopathy    a. 03/2015 s/p MDT single lead AICD;  b. 10/2015 Echo: EF 20-25%.   Mass of upper lobe of right  lung    a. noted on CXR & CT 08/2017 w/ abnl PET CT.   Mitral valve disease    a. 2006 s/p MVR w/ 34mm SJM bileaflet mechanical valve-->chronic coumadin/ASA.   Personal history of radiation therapy    f/u lung ca      Surgical History:  Past Surgical History:  Procedure Laterality Date   APPENDECTOMY  1998   BREAST EXCISIONAL BIOPSY Left 80s   benign   CARDIAC CATHETERIZATION     CARDIAC DEFIBRILLATOR PLACEMENT     RADICAL HYSTERECTOMY  1998   SHOULDER SURGERY Bilateral    rotator cuff tears   VALVE REPLACEMENT     Mitral valve; 27 mm St. Jude bileaflet valve     Home Meds: Prior to Admission medications   Medication Sig Start Date End Date Taking? Authorizing Provider  aspirin 81 MG chewable tablet Chew 81 mg by mouth daily.   Yes Historical Provider, MD  atorvastatin (LIPITOR) 80 MG tablet Take 80 mg by mouth daily.   Yes Historical Provider, MD  fluticasone (FLONASE) 50 MCG/ACT nasal spray Place 2 sprays into both nostrils as needed for allergies or rhinitis.   Yes Historical Provider, MD  furosemide (LASIX) 40 MG tablet Take 40 mg by mouth daily.   Yes Historical Provider, MD  lisinopril (PRINIVIL,ZESTRIL) 2.5 MG tablet Take 2.5  mg by mouth daily.   Yes Historical Provider, MD  metoprolol succinate (TOPROL-XL) 50 MG 24 hr tablet Take 50 mg by mouth daily. Take with or immediately following a meal.   Yes Historical Provider, MD  spironolactone (ALDACTONE) 25 MG tablet Take 25 mg by mouth daily.   Yes Historical Provider, MD  traMADol (ULTRAM) 50 MG tablet Take 50 mg by mouth every 6 (six) hours as needed.   Yes Historical Provider, MD  warfarin (COUMADIN) 1 MG tablet as directed.    Yes Historical Provider, MD  warfarin (COUMADIN) 2.5 MG tablet Take 2 by mouth Wed, Thurs, Sat, Mon. + 0.5 mg On Tues, Fri, Sun.   Yes Historical Provider, MD    Allergies:  No Known Allergies  Social History   Socioeconomic History   Marital status: Married    Spouse name: Tosin     Number of children: 2   Years of education: 14   Highest education level: Some college, no degree  Occupational History    Employer: Social research officer, government strain: Not on file   Food insecurity:    Worry: Not on file    Inability: Not on file   Transportation needs:    Medical: Not on file    Non-medical: Not on file  Tobacco Use   Smoking status: Former Smoker    Packs/day: 1.00    Years: 4.00    Pack years: 4.00    Types: Cigarettes    Last attempt to quit: 1998    Years since quitting: 22.1   Smokeless tobacco: Never Used  Substance and Sexual Activity   Alcohol use: No   Drug use: No   Sexual activity: Yes    Partners: Male    Birth control/protection: Surgical  Lifestyle   Physical activity:    Days per week: Not on file    Minutes per session: Not on file   Stress: Not on file  Relationships   Social connections:    Talks on phone: Not on file    Gets together: Not on file    Attends religious service: Not on file    Active member of club or organization: Not on file    Attends meetings of clubs or organizations: Not on file    Relationship status: Not on file   Intimate partner violence:    Fear of current or ex partner: Not on file    Emotionally abused: Not on file    Physically abused: Not on file    Forced sexual activity: Not on file  Other Topics Concern   Not on file  Social History Narrative   Not on file     Family History  Problem Relation Age of Onset   Heart attack Mother    Hypertension Mother    Diabetes Mother    Emphysema Father        smoker   Diabetes Maternal Grandmother    Kidney disease Maternal Grandmother    Breast cancer Maternal Aunt    Breast cancer Maternal Aunt    Colon cancer Maternal Uncle      ROS:  Please see the history of present illness.     All other systems reviewed and negative.    Physical Exam: There were no vitals taken for this  visit. General: Well developed, well nourished female in no acute distress. Head: Normocephalic, atraumatic, sclera non-icteric, no xanthomas, nares are without discharge. EENT: normal  Lymph Nodes:  none Neck: Negative for carotid bruits. JVD 8 Device pocket well healed; without hematoma or erythema.  There is no tethering  Back:without scoliosis kyphosis Lungs: Clear bilaterally to auscultation without wheezes, rales, or rhonchi. Breathing is unlabored. Heart: RRR with S1 S2. No  /6 systolic murmur . No rubs, or gallops appreciated. Abdomen: Soft, non-tender, non-distended with normoactive bowel sounds. No hepatomegaly. No rebound/guarding. No obvious abdominal masses. Msk:  Strength and tone appear normal for age. Extremities: No clubbing or cyanosis NO  edema.  Distal pedal pulses are 2+ and equal bilaterally. Skin: Warm and Dry Neuro: Alert and oriented X 3. CN III-XII intact Grossly normal sensory and motor function . Psych:  Responds to questions appropriately with a normal affect.      Labs: Cardiac Enzymes No results for input(s): CKTOTAL, CKMB, TROPONINI in the last 72 hours. CBC Lab Results  Component Value Date   WBC 5.4 08/21/2018   HGB 12.5 08/21/2018   HCT 39.0 08/21/2018   MCV 86.9 08/21/2018   PLT 167 08/21/2018   PROTIME: Recent Labs    10/24/18 1059  INR 2.8   Chemistry No results for input(s): NA, K, CL, CO2, BUN, CREATININE, CALCIUM, PROT, BILITOT, ALKPHOS, ALT, AST, GLUCOSE in the last 168 hours.  Invalid input(s): LABALBU Lipids Lab Results  Component Value Date   CHOL 117 08/18/2018   HDL 40 08/18/2018   LDLCALC 58 08/18/2018   TRIG 96 08/18/2018   BNP No results found for: PROBNP Thyroid Function Tests: No results for input(s): TSH, T4TOTAL, T3FREE, THYROIDAB in the last 72 hours.  Invalid input(s): FREET3 Miscellaneous No results found for: DDIMER  Radiology/Studies:  Nm Pet Image Restag (ps) Skull Base To Thigh  Result Date:  10/05/2018 CLINICAL DATA:  Subsequent treatment strategy for right upper lobe non-small cell lung cancer. EXAM: NUCLEAR MEDICINE PET SKULL BASE TO THIGH TECHNIQUE: 12.43 mCi F-18 FDG was injected intravenously. Full-ring PET imaging was performed from the skull base to thigh after the radiotracer. CT data was obtained and used for attenuation correction and anatomic localization. Fasting blood glucose: 119 mg/dl COMPARISON:  PET-CT 09/07/2017 and CT chest 09/19/2018 FINDINGS: Mediastinal blood pool activity: SUV max 3.53 NECK: No hypermetabolic lymph nodes in the neck. Incidental CT findings: none CHEST: 2 adjacent peripheral right upper lobe lung nodules are again demonstrated. The more posterior and superior nodule measures 10 mm in the slightly more inferior and anterior nodule measures 8 mm. These are hypermetabolic with SUV max of 0.93. A third smaller more superior and anterior nodule measures 4 mm but is not definitely hypermetabolic. Extensive radiation changes surrounding the patient's original pulmonary lesion with mild diffuse hypermetabolism but no findings for recurrent tumor in that area. A few small scattered mediastinal lymph nodes but no hypermetabolism. No worrisome hypermetabolic left lung nodules. Incidental CT findings: Stable cardiac enlargement and age advanced aortic and coronary artery calcifications. Prosthetic mitral valve is noted. ABDOMEN/PELVIS: No findings to suggest abdominal/pelvic metastatic disease. No hepatic or adrenal gland metastasis. No abdominal or pelvic lymphadenopathy. Incidental CT findings: Stable advanced atherosclerotic calcifications involving the aorta and iliac arteries. Stable surgical changes in the pelvis. SKELETON: No findings suspicious for osseous metastatic disease. Incidental CT findings: none IMPRESSION: 1. Three peripheral right upper lobe pulmonary nodules as demonstrated on the recent chest CT. The 2 larger nodules are hypermetabolic and consistent with  metastatic disease. The third lesion only measures 4 mm but is suspicious for a metastasis also. 2. Extensive radiation changes surrounding the patient's original  right upper lobe lung lesion without focal hypermetabolism to suggest recurrent tumor in this area. 3. No findings for abdominal/pelvic metastatic disease or osseous metastatic disease. Electronically Signed   By: Marijo Sanes M.D.   On: 10/05/2018 17:25    EKG: ***  {Sinus rhythm at 72 Intervals 16/08/39}   Assessment and Plan:  Ischemic cardiomyopathy status post anterior wall MI  Mitral valve replacement-mechanical  Implantable defibrillator-Medtronic  Congestive heart failure-class IIb   Patient has congestive heart failure in the context of her ischemic cardiomyopathy. The doctors at [facility] had raised the possibility of switching to [place]. We'll try to do that today. We will decrease her Aldactone from 25--12.5 metoprolol from 50--25 in the hopes of being able to have sufficient blood pressure to initiate Entresto 20/26.  We will arrange follow-up in the heart failure clinic  He is currently taking her diuretics 40 mg 2-3 times a day without significant diuresis. I suggested that she try 80 once a day and if augmented diuretics are necessary to take 120 mg once a day or 80 mg twice a day  I will arrange establishing with Dr. Saunders Revel for general cardiology.  We have reviewed ICD protocols and we will arrange for the transmission of her [facility] to [place]   I, Margit Banda am acting as a scribe for Virl Axe, M.D. {Add scribe attestation statement}  Signed, Virl Axe, MD 10/26/18 [place], [place]

## 2018-10-26 NOTE — Patient Instructions (Signed)
Medication Instructions:  - Your physician recommends that you continue on your current medications as directed. Please refer to the Current Medication list given to you today.  If you need a refill on your cardiac medications before your next appointment, please call your pharmacy.   Lab work: - none ordered  If you have labs (blood work) drawn today and your tests are completely normal, you will receive your results only by: [name] MyChart Message (if you have MyChart) OR . A paper copy in the mail If you have any lab test that is abnormal or we need to change your treatment, we will call you to review the results.  Testing/Procedures: - none ordered  Follow-Up: At [facility], you and your health needs are our priority.  As part of our continuing mission to provide you with exceptional heart care, we have created designated Provider Care Teams.  These Care Teams include your primary Cardiologist (physician) and Advanced Practice Providers (APPs -  Physician Assistants and Nurse Practitioners) who all work together to provide you with the care you need, when you need it. . in 4 weeks with Dr. Caryl Comes- Tuesday 3/24 at 10:45 am  Any Other Special Instructions Will Be Listed Below (If Applicable). - N/A

## 2018-10-26 NOTE — Progress Notes (Signed)
ELECTROPHYSIOLOGY OFFICE NOTE  Patient ID: Vanessa Carlson, MRN: [id number], DOB/AGE: 1961-02-16 58 y.o. Admit date: (Not on file) Date of Consult: 10/27/2018  Primary Physician: Virginia Crews, MD Primary Cardiologist: new   Vanessa Carlson is being seen today for the evaluation of ICD  And cardiac careat the request of  Bacigalupo, Dionne Bucy, MD.   HPI Vanessa Carlson is a 58 y.o. female  Seen to check her device prior to initiation of radiation therapy for lung cancer  She has a complicated cardiac history. 2006 she underwent mitral valve replacement-bioprosthetic.3/16 she had an anterior wall MI; they were unable to open the LAD at the [facility] and she suffered cardiogenic shock and pericarditis.  she received an ICD Medtronic  DATE TEST    3/16    echo   EF 35 %   3/17    echo   EF 25 %          No interval syncope or ICD shocks No significant edema.  Shortness of breath limited but stable.    Past Medical History:  Diagnosis Date   AICD (automatic cardioverter/defibrillator) present    Anterior myocardial infarction [facility])    a. 10/2014 - occluded LAD, complicated by cardiogenic shock-->Med Rx as interventional team was unable to open LAD.   Cancer of upper lobe of right lung ([place]) 09/2017   radiation therapy right side   Cervical cancer ([place])    in remission for ~20 yrs, s/p hysterectomy   Chronic combined systolic and diastolic CHF (congestive heart failure) ([place])    a. 10/2015 Echo: EF 20-25%, sev diast dysfxn, mildly reduced RV fxn. Nl MV prosthesis.   Coronary artery disease    a. 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   Essential hypertension    Ischemic cardiomyopathy    a. 03/2015 s/p MDT single lead AICD;  b. 10/2015 Echo: EF 20-25%.   Mass of upper lobe of right lung    a. noted on CXR & CT 08/2017 w/ abnl PET CT.   Mitral  valve disease    a. 2006 s/p MVR w/ 68mm SJM bileaflet mechanical valve-->chronic coumadin/ASA.   Personal history of radiation therapy    f/u lung ca      Surgical History:  Past Surgical History:  Procedure Laterality Date   APPENDECTOMY  1998   BREAST EXCISIONAL BIOPSY Left 80s   benign   CARDIAC CATHETERIZATION     CARDIAC DEFIBRILLATOR PLACEMENT     RADICAL HYSTERECTOMY  1998   SHOULDER SURGERY Bilateral    rotator cuff tears   VALVE REPLACEMENT     Mitral valve; 27 mm St. Jude bileaflet valve     Home Meds: Prior to Admission medications   Medication Sig Start Date End Date Taking? Authorizing Provider  aspirin 81 MG chewable tablet Chew 81 mg by mouth daily.   Yes Historical Provider, MD  atorvastatin (LIPITOR) 80 MG tablet Take 80 mg by mouth daily.   Yes Historical Provider, MD  fluticasone (FLONASE) 50 MCG/ACT nasal spray Place 2 sprays into both nostrils as needed for allergies or rhinitis.   Yes Historical Provider, MD  furosemide (LASIX) 40 MG tablet Take 40 mg by mouth daily.   Yes Historical Provider, MD  lisinopril (PRINIVIL,ZESTRIL) 2.5 MG tablet Take 2.5 mg by mouth daily.   Yes Historical Provider, MD  metoprolol succinate (TOPROL-XL) 50  MG 24 hr tablet Take 50 mg by mouth daily. Take with or immediately following a meal.   Yes Historical Provider, MD  spironolactone (ALDACTONE) 25 MG tablet Take 25 mg by mouth daily.   Yes Historical Provider, MD  traMADol (ULTRAM) 50 MG tablet Take 50 mg by mouth every 6 (six) hours as needed.   Yes Historical Provider, MD  warfarin (COUMADIN) 1 MG tablet as directed.    Yes Historical Provider, MD  warfarin (COUMADIN) 2.5 MG tablet Take 2 by mouth Wed, Thurs, Sat, Mon. + 0.5 mg On Tues, Fri, Sun.   Yes Historical Provider, MD    Allergies:  No Known Allergies  Social History   Socioeconomic History   Marital status: Married    Spouse name: Tosin   Number of children: 2   Years of education: 14   Highest  education level: Some college, no degree  Occupational History    Employer: Social research officer, government strain: Not on file   Food insecurity:    Worry: Not on file    Inability: Not on file   Transportation needs:    Medical: Not on file    Non-medical: Not on file  Tobacco Use   Smoking status: Former Smoker    Packs/day: 1.00    Years: 4.00    Pack years: 4.00    Types: Cigarettes    Last attempt to quit: 1998    Years since quitting: 22.1   Smokeless tobacco: Never Used  Substance and Sexual Activity   Alcohol use: No   Drug use: No   Sexual activity: Yes    Partners: Male    Birth control/protection: Surgical  Lifestyle   Physical activity:    Days per week: Not on file    Minutes per session: Not on file   Stress: Not on file  Relationships   Social connections:    Talks on phone: Not on file    Gets together: Not on file    Attends religious service: Not on file    Active member of club or organization: Not on file    Attends meetings of clubs or organizations: Not on file    Relationship status: Not on file   Intimate partner violence:    Fear of current or ex partner: Not on file    Emotionally abused: Not on file    Physically abused: Not on file    Forced sexual activity: Not on file  Other Topics Concern   Not on file  Social History Narrative   Not on file     Family History  Problem Relation Age of Onset   Heart attack Mother    Hypertension Mother    Diabetes Mother    Emphysema Father        smoker   Diabetes Maternal Grandmother    Kidney disease Maternal Grandmother    Breast cancer Maternal Aunt    Breast cancer Maternal Aunt    Colon cancer Maternal Uncle      ROS:  Please see the history of present illness.     All other systems reviewed and negative.    Physical Exam: Blood pressure 100/62, pulse 74, height 5\' 2"  (1.575 m), weight 231 lb (104.8 kg). Well developed and well  nourished in no acute distress HENT normal Neck supple with JVP-flat Clear Device pocket well healed; without hematoma or erythema.  There is no tethering  Regular rate and rhythm, no gallop  No  murmur Abd-soft with active BS No Clubbing cyanosis   edema Skin-warm and dry A & Oriented  Grossly normal sensory and motor function     Labs: Cardiac Enzymes No results for input(s): CKTOTAL, CKMB, TROPONINI in the last 72 hours. CBC Lab Results  Component Value Date   WBC 5.4 08/21/2018   HGB 12.5 08/21/2018   HCT 39.0 08/21/2018   MCV 86.9 08/21/2018   PLT 167 08/21/2018   PROTIME: No results for input(s): LABPROT, INR in the last 72 hours. Chemistry No results for input(s): NA, K, CL, CO2, BUN, CREATININE, CALCIUM, PROT, BILITOT, ALKPHOS, ALT, AST, GLUCOSE in the last 168 hours.  Invalid input(s): LABALBU Lipids Lab Results  Component Value Date   CHOL 117 08/18/2018   HDL 40 08/18/2018   LDLCALC 58 08/18/2018   TRIG 96 08/18/2018   BNP No results found for: PROBNP Thyroid Function Tests: No results for input(s): TSH, T4TOTAL, T3FREE, THYROIDAB in the last 72 hours.  Invalid input(s): FREET3 Miscellaneous No results found for: DDIMER  Radiology/Studies:  Nm Pet Image Restag (ps) Skull Base To Thigh  Result Date: 10/05/2018 CLINICAL DATA:  Subsequent treatment strategy for right upper lobe non-small cell lung cancer. EXAM: NUCLEAR MEDICINE PET SKULL BASE TO THIGH TECHNIQUE: 12.43 mCi F-18 FDG was injected intravenously. Full-ring PET imaging was performed from the skull base to thigh after the radiotracer. CT data was obtained and used for attenuation correction and anatomic localization. Fasting blood glucose: 119 mg/dl COMPARISON:  PET-CT 09/07/2017 and CT chest 09/19/2018 FINDINGS: Mediastinal blood pool activity: SUV max 3.53 NECK: No hypermetabolic lymph nodes in the neck. Incidental CT findings: none CHEST: 2 adjacent peripheral right upper lobe lung nodules are  again demonstrated. The more posterior and superior nodule measures 10 mm in the slightly more inferior and anterior nodule measures 8 mm. These are hypermetabolic with SUV max of 8.41. A third smaller more superior and anterior nodule measures 4 mm but is not definitely hypermetabolic. Extensive radiation changes surrounding the patient's original pulmonary lesion with mild diffuse hypermetabolism but no findings for recurrent tumor in that area. A few small scattered mediastinal lymph nodes but no hypermetabolism. No worrisome hypermetabolic left lung nodules. Incidental CT findings: Stable cardiac enlargement and age advanced aortic and coronary artery calcifications. Prosthetic mitral valve is noted. ABDOMEN/PELVIS: No findings to suggest abdominal/pelvic metastatic disease. No hepatic or adrenal gland metastasis. No abdominal or pelvic lymphadenopathy. Incidental CT findings: Stable advanced atherosclerotic calcifications involving the aorta and iliac arteries. Stable surgical changes in the pelvis. SKELETON: No findings suspicious for osseous metastatic disease. Incidental CT findings: none IMPRESSION: 1. Three peripheral right upper lobe pulmonary nodules as demonstrated on the recent chest CT. The 2 larger nodules are hypermetabolic and consistent with metastatic disease. The third lesion only measures 4 mm but is suspicious for a metastasis also. 2. Extensive radiation changes surrounding the patient's original right upper lobe lung lesion without focal hypermetabolism to suggest recurrent tumor in this area. 3. No findings for abdominal/pelvic metastatic disease or osseous metastatic disease. Electronically Signed   By: Marijo Sanes M.D.   On: 10/05/2018 17:25       Assessment and Plan:  Ischemic cardiomyopathy status post anterior wall MI  Mitral valve replacement-mechanical  Implantable defibrillator-Medtronic  Congestive heart failure-class IIb  Lung cancer anticipating radiation  therapy    Device function is normal.  We will plan to reinterrogate the device at the completion of her therapy    Virl Axe

## 2018-10-30 ENCOUNTER — Ambulatory Visit
Admission: RE | Admit: 2018-10-30 | Discharge: 2018-10-30 | Disposition: A | Discharge status: Home / Self Care | Payer: Medicare HMO | Source: Ambulatory Visit | Attending: Radiation Oncology | Admitting: Radiation Oncology

## 2018-10-30 DIAGNOSIS — Z510 Encounter for antineoplastic radiation therapy: Secondary | ICD-10-CM | POA: Diagnosis not present

## 2018-10-30 DIAGNOSIS — I2510 Atherosclerotic heart disease of native coronary artery without angina pectoris: Secondary | ICD-10-CM | POA: Insufficient documentation

## 2018-10-30 DIAGNOSIS — Z87891 Personal history of nicotine dependence: Secondary | ICD-10-CM | POA: Diagnosis not present

## 2018-10-30 DIAGNOSIS — Z90710 Acquired absence of both cervix and uterus: Secondary | ICD-10-CM | POA: Insufficient documentation

## 2018-10-30 DIAGNOSIS — I252 Old myocardial infarction: Secondary | ICD-10-CM | POA: Diagnosis not present

## 2018-10-30 DIAGNOSIS — Z79899 Other long term (current) drug therapy: Secondary | ICD-10-CM | POA: Diagnosis not present

## 2018-10-30 DIAGNOSIS — I255 Ischemic cardiomyopathy: Secondary | ICD-10-CM | POA: Diagnosis not present

## 2018-10-30 DIAGNOSIS — R5383 Other fatigue: Secondary | ICD-10-CM | POA: Diagnosis not present

## 2018-10-30 DIAGNOSIS — I110 Hypertensive heart disease with heart failure: Secondary | ICD-10-CM | POA: Insufficient documentation

## 2018-10-30 DIAGNOSIS — I700 Atherosclerosis of aorta: Secondary | ICD-10-CM | POA: Insufficient documentation

## 2018-10-30 DIAGNOSIS — Z923 Personal history of irradiation: Secondary | ICD-10-CM | POA: Insufficient documentation

## 2018-10-30 DIAGNOSIS — C3411 Malignant neoplasm of upper lobe, right bronchus or lung: Secondary | ICD-10-CM | POA: Insufficient documentation

## 2018-10-30 DIAGNOSIS — Z7982 Long term (current) use of aspirin: Secondary | ICD-10-CM | POA: Insufficient documentation

## 2018-10-30 DIAGNOSIS — Z8541 Personal history of malignant neoplasm of cervix uteri: Secondary | ICD-10-CM | POA: Diagnosis not present

## 2018-10-30 DIAGNOSIS — I5042 Chronic combined systolic (congestive) and diastolic (congestive) heart failure: Secondary | ICD-10-CM | POA: Diagnosis not present

## 2018-10-30 NOTE — Addendum Note (Signed)
Addended by: Britt Bottom on: 10/30/2018 08:56 AM   Modules accepted: Orders

## 2018-10-31 ENCOUNTER — Ambulatory Visit (INDEPENDENT_AMBULATORY_CARE_PROVIDER_SITE_OTHER): Payer: Medicare HMO

## 2018-10-31 DIAGNOSIS — I5042 Chronic combined systolic (congestive) and diastolic (congestive) heart failure: Secondary | ICD-10-CM

## 2018-10-31 DIAGNOSIS — Z95810 Presence of automatic (implantable) cardiac defibrillator: Secondary | ICD-10-CM

## 2018-11-01 ENCOUNTER — Ambulatory Visit
Admission: RE | Admit: 2018-11-01 | Discharge: 2018-11-01 | Disposition: A | Discharge status: Home / Self Care | Payer: Medicare HMO | Source: Ambulatory Visit | Attending: Radiation Oncology | Admitting: Radiation Oncology

## 2018-11-01 ENCOUNTER — Telehealth: Payer: Self-pay

## 2018-11-01 DIAGNOSIS — I5042 Chronic combined systolic (congestive) and diastolic (congestive) heart failure: Secondary | ICD-10-CM | POA: Diagnosis not present

## 2018-11-01 DIAGNOSIS — R5383 Other fatigue: Secondary | ICD-10-CM | POA: Diagnosis not present

## 2018-11-01 DIAGNOSIS — I700 Atherosclerosis of aorta: Secondary | ICD-10-CM | POA: Diagnosis not present

## 2018-11-01 DIAGNOSIS — C3411 Malignant neoplasm of upper lobe, right bronchus or lung: Secondary | ICD-10-CM | POA: Diagnosis not present

## 2018-11-01 DIAGNOSIS — I2510 Atherosclerotic heart disease of native coronary artery without angina pectoris: Secondary | ICD-10-CM | POA: Diagnosis not present

## 2018-11-01 DIAGNOSIS — I255 Ischemic cardiomyopathy: Secondary | ICD-10-CM | POA: Diagnosis not present

## 2018-11-01 DIAGNOSIS — I110 Hypertensive heart disease with heart failure: Secondary | ICD-10-CM | POA: Diagnosis not present

## 2018-11-01 DIAGNOSIS — Z510 Encounter for antineoplastic radiation therapy: Secondary | ICD-10-CM | POA: Diagnosis not present

## 2018-11-01 DIAGNOSIS — I252 Old myocardial infarction: Secondary | ICD-10-CM | POA: Diagnosis not present

## 2018-11-01 NOTE — Telephone Encounter (Signed)
Remote ICM transmission received.  Attempted call to patient regarding ICM remote transmission and left detailed message, per DPR, with next ICM remote transmission date of 12/25/2018.  Advised to return call for any fluid symptoms or questions.

## 2018-11-01 NOTE — Progress Notes (Signed)
EPIC Encounter for ICM Monitoring  Patient Name: Vanessa Carlson is a 58 y.o. female Date: 11/01/2018 Primary Care Physican: Virginia Crews, MD Primary Cardiologist: Carlis Abbott, NP HF Electrophysiologist: Caryl Comes Last Weight:224lbs Today's Weight: unknown   Attempted call to patient and unable to reach.  Left detailed message per DPR regarding transmission. Transmission reviewed.  Patient seen by Dr Caryl Comes 10/26/2018 to check her device prior to initiation of radiation therapy for lung cancer.  Thoracic impedance normal.   Prescribed:Furosemide 40 mg 1 tablet 3 times daily.   Labs: 08/18/2018 Creatinine 1.06, BUN 10, Potassium 3.9, Sodium 142, eGFR 58-67 03/22/2018 Creatinine 0.89, BUN 14, Potassium 3.8, Sodium 141, eGFR >60 12/21/2017 Creatinine 0.91, BUN 9, Potassium 3.9, Sodium 143, eGFR 70-81 08/31/2017 Creatinine 1.06, BUN 14, Potassium 3.9, Sodium 139, EGFR 57->60  Recommendations:Left voice mail with ICM number and encouraged to call if experiencing any fluid symptoms.  Follow-up plan: ICM clinic phone appointment on4/27/2020.   Office visit with Dr Caryl Comes 11/21/2018, Dr End 11/22/2018, Darylene Price 11/30/2018.  Copy of ICM check sent to Dr. Caryl Comes.   3 month ICM trend: 10/31/2018    1 Year ICM trend:       [name], RN 11/01/2018 10:50 AM

## 2018-11-06 ENCOUNTER — Ambulatory Visit
Admission: RE | Admit: 2018-11-06 | Discharge: 2018-11-06 | Disposition: A | Discharge status: Home / Self Care | Payer: Medicare HMO | Source: Ambulatory Visit | Attending: Radiation Oncology | Admitting: Radiation Oncology

## 2018-11-06 DIAGNOSIS — I5042 Chronic combined systolic (congestive) and diastolic (congestive) heart failure: Secondary | ICD-10-CM | POA: Diagnosis not present

## 2018-11-06 DIAGNOSIS — I2510 Atherosclerotic heart disease of native coronary artery without angina pectoris: Secondary | ICD-10-CM | POA: Diagnosis not present

## 2018-11-06 DIAGNOSIS — Z510 Encounter for antineoplastic radiation therapy: Secondary | ICD-10-CM | POA: Diagnosis not present

## 2018-11-06 DIAGNOSIS — R5383 Other fatigue: Secondary | ICD-10-CM | POA: Diagnosis not present

## 2018-11-06 DIAGNOSIS — I255 Ischemic cardiomyopathy: Secondary | ICD-10-CM | POA: Diagnosis not present

## 2018-11-06 DIAGNOSIS — I700 Atherosclerosis of aorta: Secondary | ICD-10-CM | POA: Diagnosis not present

## 2018-11-06 DIAGNOSIS — I252 Old myocardial infarction: Secondary | ICD-10-CM | POA: Diagnosis not present

## 2018-11-06 DIAGNOSIS — I110 Hypertensive heart disease with heart failure: Secondary | ICD-10-CM | POA: Diagnosis not present

## 2018-11-06 DIAGNOSIS — C3411 Malignant neoplasm of upper lobe, right bronchus or lung: Secondary | ICD-10-CM | POA: Diagnosis not present

## 2018-11-08 ENCOUNTER — Ambulatory Visit
Admission: RE | Admit: 2018-11-08 | Discharge: 2018-11-08 | Disposition: A | Discharge status: Home / Self Care | Payer: Medicare HMO | Source: Ambulatory Visit | Attending: Radiation Oncology | Admitting: Radiation Oncology

## 2018-11-08 DIAGNOSIS — I700 Atherosclerosis of aorta: Secondary | ICD-10-CM | POA: Diagnosis not present

## 2018-11-08 DIAGNOSIS — I255 Ischemic cardiomyopathy: Secondary | ICD-10-CM | POA: Diagnosis not present

## 2018-11-08 DIAGNOSIS — I2510 Atherosclerotic heart disease of native coronary artery without angina pectoris: Secondary | ICD-10-CM | POA: Diagnosis not present

## 2018-11-08 DIAGNOSIS — R5383 Other fatigue: Secondary | ICD-10-CM | POA: Diagnosis not present

## 2018-11-08 DIAGNOSIS — I252 Old myocardial infarction: Secondary | ICD-10-CM | POA: Diagnosis not present

## 2018-11-08 DIAGNOSIS — I110 Hypertensive heart disease with heart failure: Secondary | ICD-10-CM | POA: Diagnosis not present

## 2018-11-08 DIAGNOSIS — Z510 Encounter for antineoplastic radiation therapy: Secondary | ICD-10-CM | POA: Diagnosis not present

## 2018-11-08 DIAGNOSIS — I5042 Chronic combined systolic (congestive) and diastolic (congestive) heart failure: Secondary | ICD-10-CM | POA: Diagnosis not present

## 2018-11-08 DIAGNOSIS — C3411 Malignant neoplasm of upper lobe, right bronchus or lung: Secondary | ICD-10-CM | POA: Diagnosis not present

## 2018-11-11 ENCOUNTER — Other Ambulatory Visit: Payer: Self-pay | Admitting: Family Medicine

## 2018-11-13 ENCOUNTER — Ambulatory Visit
Admission: RE | Admit: 2018-11-13 | Discharge: 2018-11-13 | Disposition: A | Discharge status: Home / Self Care | Payer: Medicare HMO | Source: Ambulatory Visit | Attending: Radiation Oncology | Admitting: Radiation Oncology

## 2018-11-13 ENCOUNTER — Other Ambulatory Visit: Payer: Self-pay

## 2018-11-13 DIAGNOSIS — I5042 Chronic combined systolic (congestive) and diastolic (congestive) heart failure: Secondary | ICD-10-CM | POA: Diagnosis not present

## 2018-11-13 DIAGNOSIS — I255 Ischemic cardiomyopathy: Secondary | ICD-10-CM | POA: Diagnosis not present

## 2018-11-13 DIAGNOSIS — Z510 Encounter for antineoplastic radiation therapy: Secondary | ICD-10-CM | POA: Diagnosis not present

## 2018-11-13 DIAGNOSIS — R5383 Other fatigue: Secondary | ICD-10-CM | POA: Diagnosis not present

## 2018-11-13 DIAGNOSIS — C3411 Malignant neoplasm of upper lobe, right bronchus or lung: Secondary | ICD-10-CM | POA: Diagnosis not present

## 2018-11-13 DIAGNOSIS — I700 Atherosclerosis of aorta: Secondary | ICD-10-CM | POA: Diagnosis not present

## 2018-11-13 DIAGNOSIS — I2510 Atherosclerotic heart disease of native coronary artery without angina pectoris: Secondary | ICD-10-CM | POA: Diagnosis not present

## 2018-11-13 DIAGNOSIS — I252 Old myocardial infarction: Secondary | ICD-10-CM | POA: Diagnosis not present

## 2018-11-13 DIAGNOSIS — I110 Hypertensive heart disease with heart failure: Secondary | ICD-10-CM | POA: Diagnosis not present

## 2018-11-13 DIAGNOSIS — Z87891 Personal history of nicotine dependence: Secondary | ICD-10-CM | POA: Diagnosis not present

## 2018-11-14 ENCOUNTER — Telehealth: Payer: Self-pay | Admitting: Internal Medicine

## 2018-11-14 NOTE — Telephone Encounter (Signed)
LMOVM for pt requesting that she send a manual transmission w/ her home monitor.

## 2018-11-14 NOTE — Telephone Encounter (Signed)
Called and spoke to pt regarding appointment 11/21/18 for device interrogation   Unable to reach pt.  Will ask [facility] to request a transimission and then make decision regarding appt   Left VM about this

## 2018-11-15 NOTE — Telephone Encounter (Signed)
Spoke w/ pt and instructed her how to send a manual transmission. Transmission received.

## 2018-11-16 ENCOUNTER — Encounter: Payer: Self-pay | Admitting: Family Medicine

## 2018-11-16 NOTE — Telephone Encounter (Signed)
Yes  thx

## 2018-11-16 NOTE — Telephone Encounter (Signed)
Dr. Caryl Comes- ok to push out 6-8 weeks?

## 2018-11-16 NOTE — Telephone Encounter (Signed)
Transmission received on 11/15/18 at 09:43. Normal device function. Presenting rhythm Vs @ 77bpm w/PVC. No episodes or alert conditions. Cardiac Compass and lead trends stable.  Routed to Dr. Caryl Comes and Nira Conn, [facility], as Juluis Rainier.

## 2018-11-17 ENCOUNTER — Encounter: Payer: Self-pay | Admitting: Internal Medicine

## 2018-11-17 NOTE — Progress Notes (Signed)
Called and spoke to pt regarding appointment 11/21/18 for device followup   Currently symptoms are stable   Discussed rescheduling of the appt 8-12 weeks from now  Pt is agreeable and advised to call if interval problems

## 2018-11-17 NOTE — Telephone Encounter (Signed)
Noted  

## 2018-11-17 NOTE — Telephone Encounter (Signed)
The patient is scheduled to see Dr. Caryl Comes on 11/21/18.  I left a message for her to please call back to evaluate/ screen for COVID-19.

## 2018-11-17 NOTE — Telephone Encounter (Signed)
Per patient Dr. Caryl Comes spoke with her to cancel upcoming appt.  Patient aware we will call back at a later time to reschedule.

## 2018-11-20 ENCOUNTER — Telehealth: Payer: Self-pay

## 2018-11-20 NOTE — Telephone Encounter (Signed)

## 2018-11-21 ENCOUNTER — Ambulatory Visit (INDEPENDENT_AMBULATORY_CARE_PROVIDER_SITE_OTHER): Payer: Medicare HMO

## 2018-11-21 ENCOUNTER — Encounter: Payer: Medicare HMO | Admitting: Internal Medicine

## 2018-11-21 ENCOUNTER — Other Ambulatory Visit: Payer: Self-pay

## 2018-11-21 DIAGNOSIS — Z952 Presence of prosthetic heart valve: Secondary | ICD-10-CM

## 2018-11-21 LAB — POCT INR
INR: 1.8 — AB (ref 2.0–3.0)
PT: 21.7

## 2018-11-21 NOTE — Patient Instructions (Signed)
Description   Dx: Mechanical Valve  Continue 7.5mg  daily except 10 mg Tue. And Thursday Recheck in 2 weeks

## 2018-11-22 ENCOUNTER — Ambulatory Visit: Payer: Medicare Other | Admitting: Internal Medicine

## 2018-11-27 NOTE — Telephone Encounter (Signed)
Scheduled telephone e visit for 4/2 with Dr End at 130p

## 2018-11-29 ENCOUNTER — Telehealth: Payer: Self-pay | Admitting: *Deleted

## 2018-11-29 NOTE — Progress Notes (Signed)
Virtual Visit via Telephone Note    Evaluation Performed:  Follow-up visit  This visit type was conducted due to national recommendations for restrictions regarding the COVID-19 Pandemic (e.g. social distancing).  This format is felt to be most appropriate for this patient at this time.  All issues noted in this document were discussed and addressed.  No physical exam was performed (except for noted visual exam findings with Video Visits).  Verbal consent obtained from the patient.  Date:  11/30/2018   ID:  Vanessa Carlson, DOB 07/16/1961, MRN [id number]  Patient Location:  [address] [place] [address]  Provider location:   [place], [place]  PCP:  Virginia Crews, MD  Cardiologist:  [name], MD  Electrophysiologist:  None   Chief Complaint:  Follow-up CAD, cardiomyopathy, and valvular heart disease  History of Present Illness:    Vanessa Carlson is a 58 y.o. female who presents via audio/video conferencing for a telehealth visit today.  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  We are speaking today for follow-up of her cardiomyopathy, CAD, and valvular heart disease.  I last saw [name] in 07/2018, at which time she was doing well other than occasional edema and recent weight gain.  I encouraged her to take a single dose of furosemide 80 mg on the day that we saw her in the office followed by her usual 40 mg 3 times daily.  I encouraged her to increase furosemide to 80 mg for 1 of her 3 daily doses should she begin to gain weight or retaining fluid again.  Today, [name] reports feeling relatively well.  She  notes an intermittent dry cough that began while receiving radiation therapy for her right upper lobe lung cancer.  This is stable.  She denies significant shortness of breath.  Her leg edema is well-controlled.  She took 80 mg of furosemide once but noted some lightheadedness.  She has been reluctant to do this again out of concerns that it may have dropped her blood pressure too much.  [name] notes brief, central chest pain when coughing but otherwise no chest discomfort.  She reports daily palpitations during which it feels like her heart flutters for 3-4 seconds.  There are no associated symptoms.  [name] remains compliant with her medications.  Of note, her most recent INR was subtherapeutic at 1.8.  She is on a higher dose of warfarin with planned follow-up in the anticoagulation clinic later this month.  The patient does not have symptoms concerning for COVID-19 infection (fever, chills, cough, or new shortness of breath).    Prior CV studies:   The following studies were reviewed today:  TTE (01/05/2018): Mildly dilated LV.  LVEF 20-25% with global hypokinesis and akinesis of the apex.  Mechanical mitral valve prosthesis present with mean gradient of 4 mmHg.  Mild left atrial enlargement.  Normal RV function.  Moderate TR.  Mild to moderate pulmonary hypertension.  Past Medical History:  Diagnosis Date  . AICD (automatic cardioverter/defibrillator) present   . Anterior myocardial infarction [facility])    a. 10/2014 - occluded LAD, complicated by cardiogenic shock-->Med Rx as interventional team was unable to open LAD.  [name] Cancer of  upper lobe of right lung ([place]) 09/2017   radiation therapy right side  . Cervical cancer ([place])    in remission for ~20 yrs, s/p hysterectomy  . Chronic combined systolic and diastolic CHF (congestive heart failure) ([place])    a. 10/2015 Echo: EF 20-25%, sev diast dysfxn, mildly reduced RV fxn. Nl MV prosthesis.  . Coronary artery disease    a. 10/2014 Ant  STEMI/Cath: LAD 100p ->Med managed as lesion could not be crossed-->complicated by CGS and post-MI pericarditis ([facility]).  . Essential hypertension   . Ischemic cardiomyopathy    a. 03/2015 s/p MDT single lead AICD;  b. 10/2015 Echo: EF 20-25%.  . Mass of upper lobe of right lung    a. noted on CXR & CT 08/2017 w/ abnl PET CT.  [name] Mitral valve disease    a. 2006 s/p MVR w/ 42mm SJM bileaflet mechanical valve-->chronic coumadin/ASA.  [name] Personal history of radiation therapy    f/u lung ca   Past Surgical History:  Procedure Laterality Date  . APPENDECTOMY  1998  . BREAST EXCISIONAL BIOPSY Left 80s   benign  . CARDIAC CATHETERIZATION    . CARDIAC DEFIBRILLATOR PLACEMENT    . RADICAL HYSTERECTOMY  1998  . SHOULDER SURGERY Bilateral    rotator cuff tears  . VALVE REPLACEMENT     Mitral valve; 27 mm [employer] Jude bileaflet valve     Current Meds  Medication Sig  . aspirin 81 MG chewable tablet Chew 81 mg by mouth daily.  [name] atorvastatin (LIPITOR) 80 MG tablet TAKE 1 TABLET BY MOUTH ONCE DAILY  . diclofenac sodium (VOLTAREN) 1 % GEL Apply 2 g topically 4 (four) times daily.  [name] ENTRESTO 24-26 MG TAKE 1 TABLET BY MOUTH TWICE DAILY  . fluticasone (FLONASE) 50 MCG/ACT nasal spray Place 2 sprays into both nostrils as needed for allergies or rhinitis.  . furosemide (LASIX) 40 MG tablet TAKE 1 TABLET BY MOUTH THREE TIMES DAILY  . metoprolol succinate (TOPROL-XL) 50 MG 24 hr tablet Take 1/2 tablet (25 mg) by mouth two times a day. Take with or immediately following a meal.  . nitroGLYCERIN (NITROSTAT) 0.3 MG SL tablet Place 1 tablet (0.3 mg total) under the tongue every 5 (five) minutes as needed for chest pain.  [name] spironolactone (ALDACTONE) 25 MG tablet Take 1 tablet by mouth once daily  . traMADol (ULTRAM) 50 MG tablet Take 1 tablet (50 mg total) every 6 (six) hours as needed by mouth.  . traZODone (DESYREL) 50 MG tablet Take 0.5-1 tablets (25-50 mg total) by mouth at bedtime as needed for sleep.  [name]  warfarin (COUMADIN) 10 MG tablet Take 1 tablet (10 mg total) by mouth as directed.  . warfarin (COUMADIN) 7.5 MG tablet Take 7.5 mg by mouth daily.     Allergies:   Patient has no known allergies.   Social History   Tobacco Use  . Smoking status: Former Smoker    Packs/day: 1.00    Years: 4.00    Pack years: 4.00    Types: Cigarettes    Last attempt to quit: 1998    Years since quitting: 22.2  . Smokeless tobacco: Never Used  Substance Use Topics  . Alcohol use: No  . Drug use: No     Family Hx: The patient's family history includes Breast cancer in her maternal aunt and maternal aunt; Colon cancer in her maternal uncle; Diabetes in her maternal grandmother and mother; Emphysema in her father; Heart attack in her  mother; Hypertension in her mother; Kidney disease in her maternal grandmother.  ROS:   Please see the history of present illness.   All other systems reviewed and are negative.   Labs/Other Tests and Data Reviewed:    Recent Labs: 08/18/2018: ALT 16; BUN 10; Creatinine, Ser 1.06; Potassium 3.9; Sodium 142 08/21/2018: Hemoglobin 12.5; Platelets 167   Recent Lipid Panel Lab Results  Component Value Date/Time   CHOL 117 08/18/2018 09:57 AM   TRIG 96 08/18/2018 09:57 AM   HDL 40 08/18/2018 09:57 AM   CHOLHDL 2.9 08/18/2018 09:57 AM   CHOLHDL 2.8 07/08/2017 11:27 AM   LDLCALC 58 08/18/2018 09:57 AM   LDLCALC 66 07/08/2017 11:27 AM    Wt Readings from Last 3 Encounters:  11/30/18 227 lb (103 kg)  10/26/18 231 lb (104.8 kg)  10/16/18 231 lb (104.8 kg)     Objective:    Vital Signs:  BP 119/74 (BP Location: Left Arm, Patient Position: Sitting, Cuff Size: Normal)   Pulse 73   Ht 5' 2.5" (1.588 m)   Wt 227 lb (103 kg)   LMP  (LMP Unknown)   BMI 40.86 kg/m    Well nourished, well developed female in no acute distress.  ASSESSMENT & PLAN:    Coronary artery disease: No symptoms to suggest worsening coronary insufficiency.  Chest pain with coughing is  most likely musculoskeletal and/or pleuritic.  No ischemia evaluation is recommended at this time.  Continue aspirin, metoprolol, and atorvastatin for secondary prevention.  Chronic systolic heart failure due to ischemic cardiomyopathy: Ms. Delgreco reports stable NYHA class II heart failure symptoms.  We will continue current doses of metoprolol succinate, Entresto, and spironolactone.  Given low normal home BP, I am hesitant to escalate therapy at this time.  We will plan for repeat BMP to ensure stable renal function and potassium in 01/2019.  Valvular heart disease s/p mechanical mitral valve replacement: No symptoms to suggest valve dysfunction.  Continue warfarin and aspirin.  Palpitations: Brief and self-limited without associated symptoms.  I have personally reviewed the most recent device interrogation from 08/2018, which did not show any significant tachyarrhythmia.  We will continue current dose of metoprolol and remote ICD monitoring.  If next interrogation reveals a significant arrhythmia, palpitations worsen, or associated symptoms develop, we will have Ms. Klar be seen by Dr. Caryl Comes for further evaluation.  COVID-19 Education: The signs and symptoms of COVID-19 were discussed with the patient and how to seek care for testing (follow up with PCP or arrange E-visit).  The importance of social distancing was discussed today.  Patient Risk:   After full review of this patient's clinical status, I feel that they are at least moderate risk at this time.  Time:   Today, I have spent 20 minutes with the patient with telehealth technology discussing heart failure, coronary artery disease, palpitations, interval cancer treatment, and COVID-19 precautions.     Medication Adjustments/Labs and Tests Ordered: Current medicines are reviewed at length with the patient today.  Concerns regarding medicines are outlined above.   Tests Ordered: Orders Placed This Encounter  Procedures  .  Basic metabolic panel   Medication Changes: None.  Disposition:  Follow up in 3 month(s)  Signed, [name], MD  11/30/2018 4:15 PM    [facility]

## 2018-11-29 NOTE — Telephone Encounter (Signed)
Called patient and offered WebEx video visit. She agreed. Verbalized understanding to download WebEx app and that CMA would call her about 15 minutes prior to appointment. She was appreciative.

## 2018-11-30 ENCOUNTER — Telehealth (INDEPENDENT_AMBULATORY_CARE_PROVIDER_SITE_OTHER): Payer: Medicare HMO | Admitting: Internal Medicine

## 2018-11-30 ENCOUNTER — Ambulatory Visit: Payer: Medicare Other | Admitting: Family

## 2018-11-30 ENCOUNTER — Other Ambulatory Visit: Payer: Self-pay

## 2018-11-30 ENCOUNTER — Encounter: Payer: Self-pay | Admitting: Internal Medicine

## 2018-11-30 VITALS — BP 119/74 | HR 73 | Ht 62.5 in | Wt 227.0 lb

## 2018-11-30 DIAGNOSIS — I38 Endocarditis, valve unspecified: Secondary | ICD-10-CM

## 2018-11-30 DIAGNOSIS — I5022 Chronic systolic (congestive) heart failure: Secondary | ICD-10-CM | POA: Diagnosis not present

## 2018-11-30 DIAGNOSIS — R002 Palpitations: Secondary | ICD-10-CM

## 2018-11-30 DIAGNOSIS — I255 Ischemic cardiomyopathy: Secondary | ICD-10-CM | POA: Diagnosis not present

## 2018-11-30 DIAGNOSIS — I2510 Atherosclerotic heart disease of native coronary artery without angina pectoris: Secondary | ICD-10-CM

## 2018-11-30 NOTE — Patient Instructions (Signed)
Medication Instructions:  Your physician recommends that you continue on your current medications as directed. Please refer to the Current Medication list given to you today.  If you need a refill on your cardiac medications before your next appointment, please call your pharmacy.   Lab work: Your physician recommends that you return for lab work in: late June for BMP.  Please call our office in June to schedule an appointment to come in and have this done at your convenience.   If you have labs (blood work) drawn today and your tests are completely normal, you will receive your results only by: [name] MyChart Message (if you have MyChart) OR . A paper copy in the mail If you have any lab test that is abnormal or we need to change your treatment, we will call you to review the results.  Testing/Procedures: none  Follow-Up: At [facility], you and your health needs are our priority.  As part of our continuing mission to provide you with exceptional heart care, we have created designated Provider Care Teams.  These Care Teams include your primary Cardiologist (physician) and Advanced Practice Providers (APPs -  Physician Assistants and Nurse Practitioners) who all work together to provide you with the care you need, when you need it. You will need a follow up appointment in 3 months.  Please call our office 2 months in advance to schedule this appointment.  You may see [name], MD or one of the following Advanced Practice Providers on your designated Care Team:   Murray Hodgkins, NP Christell Faith, PA-C . Marrianne Mood, PA-C

## 2018-12-05 ENCOUNTER — Other Ambulatory Visit: Payer: Self-pay

## 2018-12-05 ENCOUNTER — Ambulatory Visit (INDEPENDENT_AMBULATORY_CARE_PROVIDER_SITE_OTHER): Payer: Medicare HMO

## 2018-12-05 DIAGNOSIS — Z952 Presence of prosthetic heart valve: Secondary | ICD-10-CM

## 2018-12-05 LAB — POCT INR
INR: 2.3 (ref 2.0–3.0)
PT: 27.6

## 2018-12-05 NOTE — Patient Instructions (Signed)
Description   Dx: Mechanical Valve  Continue 7.5mg  daily except 10 mg Tue. And Thursday Recheck in 4 weeks

## 2018-12-10 ENCOUNTER — Encounter: Payer: Self-pay | Admitting: Family Medicine

## 2018-12-14 ENCOUNTER — Other Ambulatory Visit: Payer: Self-pay

## 2018-12-14 ENCOUNTER — Ambulatory Visit: Payer: Medicare HMO | Attending: Radiation Oncology | Admitting: Radiation Oncology

## 2018-12-14 ENCOUNTER — Other Ambulatory Visit: Payer: Self-pay | Admitting: *Deleted

## 2018-12-14 DIAGNOSIS — C3411 Malignant neoplasm of upper lobe, right bronchus or lung: Secondary | ICD-10-CM

## 2018-12-17 ENCOUNTER — Encounter: Payer: Self-pay | Admitting: Family Medicine

## 2018-12-18 ENCOUNTER — Telehealth: Payer: Self-pay

## 2018-12-18 ENCOUNTER — Other Ambulatory Visit: Payer: Self-pay

## 2018-12-18 ENCOUNTER — Ambulatory Visit (INDEPENDENT_AMBULATORY_CARE_PROVIDER_SITE_OTHER): Payer: Medicare HMO | Admitting: Physician Assistant

## 2018-12-18 VITALS — BP 124/74 | Temp 98.8°F | Resp 18 | Wt 229.2 lb

## 2018-12-18 DIAGNOSIS — K5900 Constipation, unspecified: Secondary | ICD-10-CM | POA: Diagnosis not present

## 2018-12-18 DIAGNOSIS — R109 Unspecified abdominal pain: Secondary | ICD-10-CM

## 2018-12-18 NOTE — Telephone Encounter (Signed)
LMTCB- Per Dr. Brita Romp patient needs a OV with a MD in the office to evaluate symptoms patient send in patient e-mail

## 2018-12-18 NOTE — Patient Instructions (Signed)

## 2018-12-18 NOTE — Progress Notes (Signed)
Patient: Vanessa Carlson Female    DOB: May 24, 1961   58 y.o.   MRN: [id number] Visit Date: 12/18/2018  Today's Provider: Trinna Post, PA-C   Chief Complaint  Patient presents with  . Abdominal Pain  . Constipation   Subjective:     HPI   Patient with history of stage I adenocarcinoma of the lung, CHF, Afib, HTN  is complaining of upper abdominal pain for 3 days. No nausea or vomiting. Patient is experiencing some growling sounds with the pain,  Constipation. She  Some bright red blood with stools yesterday with a very hard bowel movement. Abdominal pain was resolved with a bowel movement.  Drink her daily coffee and is having some loss of appetite. Thursday she had a fever 100.2 but this has resolved and she feels mostly better.   Wt Readings from Last 3 Encounters:  12/18/18 229 lb 3.2 oz (104 kg)  11/30/18 227 lb (103 kg)  10/26/18 231 lb (104.8 kg)     No Known Allergies   Current Outpatient Medications:  .  aspirin 81 MG chewable tablet, Chew 81 mg by mouth daily., Disp: , Rfl:  .  atorvastatin (LIPITOR) 80 MG tablet, TAKE 1 TABLET BY MOUTH ONCE DAILY, Disp: 90 tablet, Rfl: 1 .  diclofenac sodium (VOLTAREN) 1 % GEL, Apply 2 g topically 4 (four) times daily., Disp: 100 g, Rfl: 2 .  ENTRESTO 24-26 MG, TAKE 1 TABLET BY MOUTH TWICE DAILY, Disp: 180 tablet, Rfl: 3 .  fluticasone (FLONASE) 50 MCG/ACT nasal spray, Place 2 sprays into both nostrils as needed for allergies or rhinitis., Disp: , Rfl:  .  furosemide (LASIX) 40 MG tablet, TAKE 1 TABLET BY MOUTH THREE TIMES DAILY, Disp: 90 tablet, Rfl: 11 .  metoprolol succinate (TOPROL-XL) 50 MG 24 hr tablet, Take 1/2 tablet (25 mg) by mouth two times a day. Take with or immediately following a meal., Disp: 90 tablet, Rfl: 3 .  nitroGLYCERIN (NITROSTAT) 0.3 MG SL tablet, Place 1 tablet (0.3 mg total) under the tongue every 5 (five) minutes as needed for chest pain., Disp: 30 tablet, Rfl: 3 .  spironolactone (ALDACTONE)  25 MG tablet, Take 1 tablet by mouth once daily, Disp: 90 tablet, Rfl: 1 .  traMADol (ULTRAM) 50 MG tablet, Take 1 tablet (50 mg total) every 6 (six) hours as needed by mouth., Disp: 30 tablet, Rfl: 5 .  traZODone (DESYREL) 50 MG tablet, Take 0.5-1 tablets (25-50 mg total) by mouth at bedtime as needed for sleep., Disp: 30 tablet, Rfl: 3 .  warfarin (COUMADIN) 10 MG tablet, Take 1 tablet (10 mg total) by mouth as directed., Disp: 30 tablet, Rfl: 3 .  warfarin (COUMADIN) 7.5 MG tablet, Take 7.5 mg by mouth daily., Disp: , Rfl:   Review of Systems  Constitutional: Positive for appetite change and fever.  HENT: Negative.   Respiratory: Negative.   Cardiovascular: Negative.   Gastrointestinal: Positive for abdominal pain and constipation.  Endocrine: Negative.   Genitourinary: Negative.   Musculoskeletal: Negative.   Skin: Negative.   Allergic/Immunologic: Negative.   Neurological: Negative.   Hematological: Negative.   Psychiatric/Behavioral: Negative.     Social History   Tobacco Use  . Smoking status: Former Smoker    Packs/day: 1.00    Years: 4.00    Pack years: 4.00    Types: Cigarettes    Last attempt to quit: 1998    Years since quitting: 22.3  . Smokeless tobacco: Never  Used  Substance Use Topics  . Alcohol use: No      Objective:   BP 124/74 (BP Location: Left Arm, Patient Position: Sitting, Cuff Size: Normal)   Temp 98.8 F (37.1 C) (Oral)   Resp 18   Wt 229 lb 3.2 oz (104 kg)   LMP  (LMP Unknown)   BMI 41.25 kg/m  Vitals:   12/18/18 1526  BP: 124/74  Resp: 18  Temp: 98.8 F (37.1 C)  TempSrc: Oral  Weight: 229 lb 3.2 oz (104 kg)     Physical Exam Constitutional:      Appearance: She is well-developed.  Cardiovascular:     Rate and Rhythm: Normal rate and regular rhythm.  Pulmonary:     Breath sounds: Normal breath sounds.  Abdominal:     General: There is no distension.     Palpations: Abdomen is soft.  Skin:    General: Skin is warm and  dry.  Neurological:     Mental Status: She is alert.  Psychiatric:        Mood and Affect: Mood normal.        Behavior: Behavior normal.         Assessment & Plan    1. Constipation, unspecified constipation type  Patient's abdominal pain has resolved with bowel movement. She feels largely better. She did have some BRB per rectum with a hard bowel movement that has since resolved. It does not seem she has had colon cancer screening however with severe CHF and EF at 20% as well as lung cancer with likely metastases, she may not benefit from colon cancer screening. Will defer metastatic workup to oncology. PET in 09/2018 did not show hypermetabolic areas of GI tract. Advised to increase fiber, may use miralax or metamucil to help maintain bowel movements.   2. Abdominal pain, unspecified abdominal location  The entirety of the information documented in the History of Present Illness, Review of Systems and Physical Exam were personally obtained by me. Portions of this information were initially documented by Doran Clay, LPN and reviewed by me for thoroughness and accuracy.   F/u PRN.        Trinna Post, PA-C  [facility]

## 2018-12-18 NOTE — Telephone Encounter (Signed)
Patient called back and scheduled with Fabio Bering instead of a MD.

## 2018-12-25 ENCOUNTER — Ambulatory Visit (INDEPENDENT_AMBULATORY_CARE_PROVIDER_SITE_OTHER): Payer: Medicare HMO

## 2018-12-25 ENCOUNTER — Other Ambulatory Visit: Payer: Self-pay

## 2018-12-25 DIAGNOSIS — I5022 Chronic systolic (congestive) heart failure: Secondary | ICD-10-CM | POA: Diagnosis not present

## 2018-12-25 DIAGNOSIS — Z95810 Presence of automatic (implantable) cardiac defibrillator: Secondary | ICD-10-CM

## 2018-12-25 NOTE — Progress Notes (Signed)
EPIC Encounter for ICM Monitoring  Patient Name: Mccartney Brucks is a 58 y.o. female Date: 12/25/2018 Primary Care Physican: Virginia Crews, MD Primary Cardiologist: Carlis Abbott, NP HF Electrophysiologist: Caryl Comes Last Weight:224lbs    Transmission reviewed.    Thoracic impedance normal.   Prescribed:Furosemide 40 mg 1 tablet 3 times daily.   Labs: 08/18/2018 Creatinine 1.06, BUN 10, Potassium 3.9, Sodium 142, eGFR 58-67 03/22/2018 Creatinine 0.89, BUN 14, Potassium 3.8, Sodium 141, eGFR >60 12/21/2017 Creatinine 0.91, BUN 9, Potassium 3.9, Sodium 143, eGFR 70-81 08/31/2017 Creatinine 1.06, BUN 14, Potassium 3.9, Sodium 139, EGFR 57->60  Recommendations:None.  Follow-up plan: ICM clinic phone appointment on6/08/2018.  Copy of ICM check sent to [place].    3 month ICM trend: 12/25/2018    1 Year ICM trend:       [name], RN 12/25/2018 3:37 PM

## 2018-12-26 ENCOUNTER — Telehealth: Payer: Self-pay

## 2018-12-26 ENCOUNTER — Ambulatory Visit (INDEPENDENT_AMBULATORY_CARE_PROVIDER_SITE_OTHER): Payer: Medicare HMO | Admitting: *Deleted

## 2018-12-26 ENCOUNTER — Other Ambulatory Visit: Payer: Self-pay

## 2018-12-26 DIAGNOSIS — I5022 Chronic systolic (congestive) heart failure: Secondary | ICD-10-CM | POA: Diagnosis not present

## 2018-12-26 DIAGNOSIS — I255 Ischemic cardiomyopathy: Secondary | ICD-10-CM

## 2018-12-26 LAB — CUP PACEART REMOTE DEVICE CHECK
Battery Remaining Longevity: 107 mo
Battery Voltage: 3.01 V
Brady Statistic RV Percent Paced: 0.01 %
Date Time Interrogation Session: 20200427063323
HighPow Impedance: 82 Ohm
Implantable Lead Implant Date: 20160802
Implantable Lead Location: 753860
Implantable Pulse Generator Implant Date: 20160802
Lead Channel Impedance Value: 703 Ohm
Lead Channel Impedance Value: 760 Ohm
Lead Channel Pacing Threshold Amplitude: 0.625 V
Lead Channel Pacing Threshold Pulse Width: 0.4 ms
Lead Channel Sensing Intrinsic Amplitude: 13.25 mV
Lead Channel Sensing Intrinsic Amplitude: 13.25 mV
Lead Channel Setting Pacing Amplitude: 2 V
Lead Channel Setting Pacing Pulse Width: 0.4 ms
Lead Channel Setting Sensing Sensitivity: 0.3 mV

## 2018-12-26 NOTE — Telephone Encounter (Signed)
   TELEPHONE CALL NOTE  This patient has been deemed a candidate for follow-up tele-health visit to limit community exposure during the Covid-19 pandemic. I spoke with the patient via phone to discuss instructions. The patient was advised to review the section on consent for treatment as well. The patient will receive a phone call 2-3 days prior to their E-Visit at which time consent will be verbally confirmed. A Virtual Office Visit appointment type has been scheduled for 12/28/2018 with Darylene Price FNP.  Vonda Antigua L, CMA 12/26/2018 11:19 AM

## 2018-12-26 NOTE — Telephone Encounter (Signed)
TELEPHONE CALL NOTE  Vanessa Carlson has been deemed a candidate for a follow-up tele-health visit to limit community exposure during the Covid-19 pandemic. I spoke with the patient via phone to ensure availability of phone/video source, confirm preferred email & phone number, discuss instructions and expectations, and review consent.   I reminded Vanessa Carlson to be prepared with any vital sign and/or heart rhythm information that could potentially be obtained via home monitoring, at the time of her visit.  Finally, I reminded Vanessa Carlson to expect an e-mail containing a link for their video-based visit approximately 15 minutes before her visit, or alternatively, a phone call at the time of her visit if her visit is planned to be a phone encounter.  Did the patient verbally consent to treatment as below? YES  Gaylord Shih, CMA 12/26/2018 11:19 AM  CONSENT FOR TELE-HEALTH VISIT - PLEASE REVIEW  I hereby voluntarily request, consent and authorize [facility] and its employed or contracted physicians, physician assistants, nurse practitioners or other licensed health care professionals (the Practitioner), to provide me with telemedicine health care services (the "Services") as deemed necessary by the treating Practitioner. I acknowledge and consent to receive the Services by the Practitioner via telemedicine. I understand that the telemedicine visit will involve communicating with the Practitioner through telephonic communication technology and the disclosure of certain medical information by electronic transmission. I acknowledge that I have been given the opportunity to request an in-person assessment or other available alternative prior to the telemedicine visit and am voluntarily participating in the telemedicine visit.  I understand that I have the right to withhold or withdraw my consent to the use of telemedicine in the course of my care at any time,  without affecting my right to future care or treatment, and that the Practitioner or I may terminate the telemedicine visit at any time. I understand that I have the right to inspect all information obtained and/or recorded in the course of the telemedicine visit and may receive copies of available information for a reasonable fee.  I understand that some of the potential risks of receiving the Services via telemedicine include:  [name] Delay or interruption in medical evaluation due to technological equipment failure or disruption; . Information transmitted may not be sufficient (e.g. poor resolution of images) to allow for appropriate medical decision making by the Practitioner; and/or  . In rare instances, security protocols could fail, causing a breach of personal health information.  Furthermore, I acknowledge that it is my responsibility to provide information about my medical history, conditions and care that is complete and accurate to the best of my ability. I acknowledge that Practitioner's advice, recommendations, and/or decision may be based on factors not within their control, such as incomplete or inaccurate data provided by me or lack of visual representation. I understand that the practice of medicine is not an exact science and that Practitioner makes no warranties or guarantees regarding treatment outcomes. I acknowledge that I will receive a copy of this consent concurrently upon execution via email to the email address I last provided but may also request a printed copy by calling the office of [facility].    I understand that my insurance may be billed for this visit.   I have read or had this consent read to me. . I understand the contents of this consent, which adequately explains the benefits and risks of the Services being provided via telemedicine.  [name]  I have been provided ample opportunity to ask questions regarding this consent and the Services and have had my questions  answered to my satisfaction. . I give my informed consent for the services to be provided through the use of telemedicine in my medical care  By participating in this telemedicine visit I agree to the above.

## 2018-12-28 ENCOUNTER — Ambulatory Visit: Payer: Medicare HMO | Attending: Family | Admitting: Family

## 2018-12-28 ENCOUNTER — Encounter: Payer: Self-pay | Admitting: Family

## 2018-12-28 ENCOUNTER — Other Ambulatory Visit: Payer: Self-pay

## 2018-12-28 VITALS — Wt 220.0 lb

## 2018-12-28 DIAGNOSIS — I5022 Chronic systolic (congestive) heart failure: Secondary | ICD-10-CM

## 2018-12-28 DIAGNOSIS — I10 Essential (primary) hypertension: Secondary | ICD-10-CM

## 2018-12-28 MED ORDER — METOPROLOL SUCCINATE ER 50 MG PO TB24: ORAL_TABLET | ORAL | 3 refills | Status: DC

## 2018-12-28 MED ORDER — WARFARIN SODIUM 7.5 MG PO TABS: 7.5000 mg | ORAL_TABLET | Freq: Every day | ORAL | 3 refills | Status: DC

## 2018-12-28 MED ORDER — NITROGLYCERIN 0.3 MG SL SUBL: 0.3000 mg | SUBLINGUAL_TABLET | SUBLINGUAL | 3 refills | Status: DC | PRN

## 2018-12-28 NOTE — Patient Instructions (Signed)
Continue weighing daily and call for an overnight weight gain of > 2 pounds or a weekly weight gain of >5 pounds. 

## 2018-12-28 NOTE — Progress Notes (Signed)
Virtual Visit via Telephone Note    Evaluation Performed:  Follow-up visit  This visit type was conducted due to national recommendations for restrictions regarding the COVID-19 Pandemic (e.g. social distancing).  This format is felt to be most appropriate for this patient at this time.  All issues noted in this document were discussed and addressed.  No physical exam was performed (except for noted visual exam findings with Video Visits).  Please refer to the patient's chart (MyChart message for video visits and phone note for telephone visits) for the patient's consent to telehealth for [facility]  Date:  12/28/2018   ID:  Vanessa Carlson, DOB 07-14-1961, MRN [id number]  Patient Location:  [address] [place] [address]   Provider location:   [facility] [place] [address], [place] [address]  PCP:  Virginia Crews, MD  Cardiologist:  [name], MD  Electrophysiologist:  None   Chief Complaint:  Fatigue  History of Present Illness:    Vanessa Carlson is a 58 y.o. female who presents via audio/video conferencing for a telehealth visit today.  Patient verified DOB and address.  The patient does not have symptoms concerning for COVID-19 infection (fever, chills, cough, or new SHORTNESS OF BREATH).   Patient reports minimal fatigue upon moderate exertion. She describes this as chronic in nature having been present for several years. She has associated dizziness (at times), palpitations and chronic difficulty sleeping along with this. She denies any pedal edema, abdominal distention, chest pain, shortness of breath or weight gain. Has recently had some GI issues but says that she's feeling better from that.   Prior CV studies:   The following studies were reviewed today:  Echo report from 01/05/18 reviewed and showed an EF of 20-25% along with mild MS, moderate TR and mildly elevated PA pressure of 43 mm Hg.   Past Medical  History:  Diagnosis Date  . AICD (automatic cardioverter/defibrillator) present   . Anterior myocardial infarction [facility])    a. 10/2014 - occluded LAD, complicated by cardiogenic shock-->Med Rx as interventional team was unable to open LAD.  [name] Cancer of upper lobe of right lung ([place]) 09/2017   radiation therapy right side  . Cervical cancer ([place])    in remission for ~20 yrs, s/p hysterectomy  . Chronic combined systolic and diastolic CHF (congestive heart failure) ([place])    a. 10/2015 Echo: EF 20-25%, sev diast dysfxn, mildly reduced RV fxn. Nl MV prosthesis.  . Coronary artery disease    a. 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  . Essential hypertension   . Ischemic cardiomyopathy    a. 03/2015 s/p MDT single lead AICD;  b. 10/2015 Echo: EF 20-25%.  . Mass of upper lobe of right lung    a. noted on CXR & CT 08/2017 w/ abnl PET CT.  [name] Mitral valve disease    a. 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  [name] Personal history of radiation therapy    f/u lung ca   Past Surgical History:  Procedure Laterality Date  . APPENDECTOMY  1998  . BREAST EXCISIONAL BIOPSY Left 80s   benign  . CARDIAC CATHETERIZATION    . CARDIAC DEFIBRILLATOR PLACEMENT    . RADICAL HYSTERECTOMY  1998  . SHOULDER SURGERY Bilateral    rotator cuff tears  . VALVE REPLACEMENT  Mitral valve; 27 mm [employer] Jude bileaflet valve     Current Meds  Medication Sig  . aspirin 81 MG chewable tablet Chew 81 mg by mouth daily.  [name] atorvastatin (LIPITOR) 80 MG tablet TAKE 1 TABLET BY MOUTH ONCE DAILY  . diclofenac sodium (VOLTAREN) 1 % GEL Apply 2 g topically 4 (four) times daily.  [name] ENTRESTO 24-26 MG TAKE 1 TABLET BY MOUTH TWICE DAILY  . fluticasone (FLONASE) 50 MCG/ACT nasal spray Place 2 sprays into both nostrils as needed for allergies or rhinitis.  . furosemide (LASIX) 40 MG tablet TAKE 1 TABLET BY  MOUTH THREE TIMES DAILY  . metoprolol succinate (TOPROL-XL) 50 MG 24 hr tablet Take 1/2 tablet (25 mg) by mouth two times a day. Take with or immediately following a meal.  . nitroGLYCERIN (NITROSTAT) 0.3 MG SL tablet Place 1 tablet (0.3 mg total) under the tongue every 5 (five) minutes as needed for chest pain.  [name] spironolactone (ALDACTONE) 25 MG tablet Take 1 tablet by mouth once daily  . traMADol (ULTRAM) 50 MG tablet Take 1 tablet (50 mg total) every 6 (six) hours as needed by mouth.  . traZODone (DESYREL) 50 MG tablet Take 0.5-1 tablets (25-50 mg total) by mouth at bedtime as needed for sleep.  [name] warfarin (COUMADIN) 10 MG tablet Take 1 tablet (10 mg total) by mouth as directed.  . warfarin (COUMADIN) 7.5 MG tablet Take 1 tablet (7.5 mg total) by mouth daily.  . [DISCONTINUED] metoprolol succinate (TOPROL-XL) 50 MG 24 hr tablet Take 1/2 tablet (25 mg) by mouth two times a day. Take with or immediately following a meal.  . [DISCONTINUED] nitroGLYCERIN (NITROSTAT) 0.3 MG SL tablet Place 1 tablet (0.3 mg total) under the tongue every 5 (five) minutes as needed for chest pain.  . [DISCONTINUED] warfarin (COUMADIN) 7.5 MG tablet Take 7.5 mg by mouth as directed.      Allergies:   Patient has no known allergies.   Social History   Tobacco Use  . Smoking status: Former Smoker    Packs/day: 1.00    Years: 4.00    Pack years: 4.00    Types: Cigarettes    Last attempt to quit: 1998    Years since quitting: 22.3  . Smokeless tobacco: Never Used  Substance Use Topics  . Alcohol use: No  . Drug use: No     Family Hx: The patient's family history includes Breast cancer in her maternal aunt and maternal aunt; Colon cancer in her maternal uncle; Diabetes in her maternal grandmother and mother; Emphysema in her father; Heart attack in her mother; Hypertension in her mother; Kidney disease in her maternal grandmother.  ROS:   Please see the history of present illness.     All other systems  reviewed and are negative.   Labs/Other Tests and Data Reviewed:    Recent Labs: 08/18/2018: ALT 16; BUN 10; Creatinine, Ser 1.06; Potassium 3.9; Sodium 142 08/21/2018: Hemoglobin 12.5; Platelets 167   Recent Lipid Panel Lab Results  Component Value Date/Time   CHOL 117 08/18/2018 09:57 AM   TRIG 96 08/18/2018 09:57 AM   HDL 40 08/18/2018 09:57 AM   CHOLHDL 2.9 08/18/2018 09:57 AM   CHOLHDL 2.8 07/08/2017 11:27 AM   LDLCALC 58 08/18/2018 09:57 AM   LDLCALC 66 07/08/2017 11:27 AM    Wt Readings from Last 3 Encounters:  12/28/18 220 lb (99.8 kg)  12/18/18 229 lb 3.2 oz (104 kg)  11/30/18 227 lb (103 kg)  Exam:    Vital Signs:  Wt 220 lb (99.8 kg) Comment: self-reported  LMP  (LMP Unknown)   BMI 39.60 kg/m    Well nourished, well developed female in no  acute distress.   ASSESSMENT & PLAN:    1. Chronic heart failure with reduced ejection fraction- - NYHA class II - euvolemic based on patient's description of symptoms - weighing daily and she says that her weight has been stable. Reminded to call for an overnight weight gain of >2 pounds or a weekly weight gain of >5 pounds - not adding salt and trying to closely follow a low sodium diet - had telemedicine visit with cardiology (End) 11/30/2018 - consider titrating up entresto if BP allows - patient says that she experienced abdominal pain when metoprolol had previously been increased  2: HTN-   - did not have BP checked yet today but she says that it's been doing "ok" - saw PCP Margretta Sidle) 12/18/2018 - BMP 08/18/18 reviewed and showed sodium 142, potassium 3.9, creatinine 1.06 and GFR 67  COVID-19 Education: The signs and symptoms of COVID-19 were discussed with the patient and how to seek care for testing (follow up with PCP or arrange E-visit).  The importance of social distancing was discussed today.  Patient Risk:   After full review of this patients clinical status, I feel that they are at least moderate risk  at this time.  Time:   Today, I have spent 13 minutes with the patient with telehealth technology discussing medications, diet, weights and symptoms to call about.     Medication Adjustments/Labs and Tests Ordered: Current medicines are reviewed at length with the patient today.  Concerns regarding medicines are outlined above.   Tests Ordered: No orders of the defined types were placed in this encounter.  Medication Changes: Meds ordered this encounter  Medications  . metoprolol succinate (TOPROL-XL) 50 MG 24 hr tablet    Sig: Take 1/2 tablet (25 mg) by mouth two times a day. Take with or immediately following a meal.    Dispense:  90 tablet    Refill:  3  . nitroGLYCERIN (NITROSTAT) 0.3 MG SL tablet    Sig: Place 1 tablet (0.3 mg total) under the tongue every 5 (five) minutes as needed for chest pain.    Dispense:  30 tablet    Refill:  3  . warfarin (COUMADIN) 7.5 MG tablet    Sig: Take 1 tablet (7.5 mg total) by mouth daily.    Dispense:  90 tablet    Refill:  3    Disposition: Follow-up in 3 months or sooner for any questions/problems before then.   Signed, Alisa Graff, FNP  12/28/2018 10:25 AM    [facility]

## 2019-01-02 ENCOUNTER — Ambulatory Visit (INDEPENDENT_AMBULATORY_CARE_PROVIDER_SITE_OTHER): Payer: Medicare HMO

## 2019-01-02 ENCOUNTER — Other Ambulatory Visit: Payer: Self-pay

## 2019-01-02 DIAGNOSIS — Z952 Presence of prosthetic heart valve: Secondary | ICD-10-CM | POA: Diagnosis not present

## 2019-01-02 LAB — POCT INR
INR: 6.1 — AB (ref 2.0–3.0)
PT: 73.5

## 2019-01-02 NOTE — Patient Instructions (Signed)
Description   Dx: Mechanical Valve  Hold for 2 days then 7.5mg  daily  Recheck in 1 weeks

## 2019-01-03 NOTE — Progress Notes (Signed)
Remote ICD transmission.   

## 2019-01-09 ENCOUNTER — Other Ambulatory Visit: Payer: Self-pay

## 2019-01-09 ENCOUNTER — Ambulatory Visit (INDEPENDENT_AMBULATORY_CARE_PROVIDER_SITE_OTHER): Payer: Medicare HMO

## 2019-01-09 DIAGNOSIS — Z952 Presence of prosthetic heart valve: Secondary | ICD-10-CM

## 2019-01-09 LAB — POCT INR
INR: 3.3 — AB (ref 2.0–3.0)
PT: 40.1

## 2019-01-09 NOTE — Patient Instructions (Signed)
Description   Dx:  Mechanical heart valve Current Coumadin dose:  7.5mg  daily PT:  40.1 INR: 3.3 Today's Changes: NO CHANGE   Recheck: 2 weeks

## 2019-01-17 ENCOUNTER — Other Ambulatory Visit: Payer: Self-pay

## 2019-01-17 ENCOUNTER — Ambulatory Visit
Admission: RE | Admit: 2019-01-17 | Discharge: 2019-01-17 | Disposition: A | Discharge status: Home / Self Care | Payer: Medicare HMO | Source: Ambulatory Visit | Attending: Oncology | Admitting: Oncology

## 2019-01-17 DIAGNOSIS — C3411 Malignant neoplasm of upper lobe, right bronchus or lung: Secondary | ICD-10-CM | POA: Diagnosis not present

## 2019-01-17 LAB — POCT I-STAT CREATININE: Creatinine, Ser: 0.9 mg/dL (ref 0.44–1.00)

## 2019-01-17 MED ORDER — IOHEXOL 300 MG/ML  SOLN
75.0000 mL | Freq: Once | INTRAMUSCULAR | Status: AC | PRN
  Administered 2019-01-17: 75 mL via INTRAVENOUS

## 2019-01-19 ENCOUNTER — Encounter: Payer: Self-pay | Admitting: Oncology

## 2019-01-19 ENCOUNTER — Inpatient Hospital Stay: Payer: Medicare HMO | Attending: Oncology | Admitting: Oncology

## 2019-01-19 DIAGNOSIS — Z08 Encounter for follow-up examination after completed treatment for malignant neoplasm: Secondary | ICD-10-CM

## 2019-01-19 DIAGNOSIS — Z85118 Personal history of other malignant neoplasm of bronchus and lung: Secondary | ICD-10-CM

## 2019-01-19 NOTE — Progress Notes (Signed)
Pt has some cough- she states that after her radiation Dr. Baruch Gouty says she needs to cough and get up sputum. She says it is mainly clear sputum but sometimes at night she has light yellow. It is not a all the time cough.  She does get sob on exertion and she says it is not new and she is to expect with her lungs. No fevers.

## 2019-01-21 ENCOUNTER — Encounter: Payer: Self-pay | Admitting: Oncology

## 2019-01-21 NOTE — Progress Notes (Signed)
I connected with Vanessa Carlson on 01/21/19 at 11:15 AM EDT by video enabled telemedicine visit and verified that I am speaking with the correct person using two identifiers.   I discussed the limitations, risks, security and privacy concerns of performing an evaluation and management service by telemedicine and the availability of in-person appointments. I also discussed with the patient that there may be a patient responsible charge related to this service. The patient expressed understanding and agreed to proceed.  Other persons participating in the visit and their role in the encounter:  none  Patient's location:  home Provider's location:  home  Chief Complaint: Routine follow-up of lung cancer  History of present illness:  Patient is a 58 year old female with a past medical history significant for CAD, ischemic cardiomyopathy status post AICD placement, rheumatic heart disease status post mechanical mitral valve replacement on Coumadin.She was found to have a right upper lobe lung mass 2.5 cm that was hypermetabolic on PET CT scan in January 2019.  No evidence of Carlson involvement.  She was not deemed to be a candidate for surgery and underwent SBRT to this lesion.  This was biopsy-proven adenocarcinoma with acinar and solid morphologies.  She then had a CT scan in January 2020 which showed enlarging satellite right upper lobe lung nodules.  This was followed by a PET CT scan which showed right upper lobe lung nodules 2 of them were hypermetabolic with an SUV of 8.7 was also a thyroid nodule approximately 4 mm in size which was not definitely hypermetabolic.  Patient was seen by Dr. Donella Stade following this and sh received SBRT for 5 fractions encompassing all these 3 nodules.  Patient has not had any systemic chemotherapy for her lung cancer so far  Interval history: She is doing well and denies any symptoms of fatigue or exertional shortness of breath.  Denies any fever.  She has occasional cough  with mild clear expectoration   Review of Systems  Constitutional: Negative for chills, fever, malaise/fatigue and weight loss.  HENT: Negative for congestion, ear discharge and nosebleeds.   Eyes: Negative for blurred vision.  Respiratory: Positive for cough. Negative for hemoptysis, sputum production, shortness of breath and wheezing.   Cardiovascular: Negative for chest pain, palpitations, orthopnea and claudication.  Gastrointestinal: Negative for abdominal pain, blood in stool, constipation, diarrhea, heartburn, melena, nausea and vomiting.  Genitourinary: Negative for dysuria, flank pain, frequency, hematuria and urgency.  Musculoskeletal: Negative for back pain, joint pain and myalgias.  Skin: Negative for rash.  Neurological: Negative for dizziness, tingling, focal weakness, seizures, weakness and headaches.  Endo/Heme/Allergies: Does not bruise/bleed easily.  Psychiatric/Behavioral: Negative for depression and suicidal ideas. The patient does not have insomnia.     No Known Allergies  Past Medical History:  Diagnosis Date  . AICD (automatic cardioverter/defibrillator) present   . Anterior myocardial infarction [facility])    a. 10/2014 - occluded LAD, complicated by cardiogenic shock-->Med Rx as interventional team was unable to open LAD.  [name] Cancer of upper lobe of right lung ([place]) 09/2017   radiation therapy right side  . Cervical cancer ([place])    in remission for ~20 yrs, s/p hysterectomy  . Chronic combined systolic and diastolic CHF (congestive heart failure) ([place])    a. 10/2015 Echo: EF 20-25%, sev diast dysfxn, mildly reduced RV fxn. Nl MV prosthesis.  . Coronary artery disease    a. 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  [name]  Essential hypertension   . Ischemic cardiomyopathy    a. 03/2015 s/p MDT single lead AICD;  b. 10/2015 Echo: EF 20-25%.  . Mass of upper lobe of right lung    a.  noted on CXR & CT 08/2017 w/ abnl PET CT.  [name] Mitral valve disease    a. 2006 s/p MVR w/ 66mm SJM bileaflet mechanical valve-->chronic coumadin/ASA.  [name] Personal history of radiation therapy    f/u lung ca    Past Surgical History:  Procedure Laterality Date  . APPENDECTOMY  1998  . BREAST EXCISIONAL BIOPSY Left 80s   benign  . CARDIAC CATHETERIZATION    . CARDIAC DEFIBRILLATOR PLACEMENT    . RADICAL HYSTERECTOMY  1998  . SHOULDER SURGERY Bilateral    rotator cuff tears  . VALVE REPLACEMENT     Mitral valve; 27 mm St. Jude bileaflet valve    Social History   Socioeconomic History  . Marital status: Married    Spouse name: Tosin  . Number of children: 2  . Years of education: 49  . 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 Financial resource strain: Not on file  . Food insecurity:    Worry: Not on file    Inability: Not on file  . Transportation needs:    Medical: Not on file    Non-medical: Not on file  Tobacco Use  . Smoking status: Former Smoker    Packs/day: 1.00    Years: 4.00    Pack years: 4.00    Types: Cigarettes    Last attempt to quit: 1998    Years since quitting: 22.4  . Smokeless tobacco: Never Used  Substance and Sexual Activity  . Alcohol use: No  . Drug use: No  . Sexual activity: Yes    Partners: Male    Birth control/protection: Surgical  Lifestyle  . Physical activity:    Days per week: Not on file    Minutes per session: Not on file  . Stress: Not on file  Relationships  . Social connections:    Talks on phone: Not on file    Gets together: Not on file    Attends religious service: Not on file    Active member of club or organization: Not on file    Attends meetings of clubs or organizations: Not on file    Relationship status: Not on file  . Intimate partner violence:    Fear of current or ex partner: Not on file    Emotionally abused: Not on file     Physically abused: Not on file    Forced sexual activity: Not on file  Other Topics Concern  . Not on file  Social History Narrative  . Not on file    Family History  Problem Relation Age of Onset  . Heart attack Mother   . Hypertension Mother   . Diabetes Mother   . Emphysema Father        smoker  . Diabetes Maternal Grandmother   . Kidney disease Maternal Grandmother   . Breast cancer Maternal Aunt   . Breast cancer Maternal Aunt   . Colon cancer Maternal Uncle      Current Outpatient Medications:  .  aspirin 81 MG chewable tablet, Chew 81 mg by mouth daily., Disp: , Rfl:  .  atorvastatin (LIPITOR) 80 MG tablet, TAKE 1 TABLET BY MOUTH ONCE DAILY, Disp: 90 tablet, Rfl:  1 .  diclofenac sodium (VOLTAREN) 1 % GEL, Apply 2 g topically 4 (four) times daily., Disp: 100 g, Rfl: 2 .  ENTRESTO 24-26 MG, TAKE 1 TABLET BY MOUTH TWICE DAILY, Disp: 180 tablet, Rfl: 3 .  fluticasone (FLONASE) 50 MCG/ACT nasal spray, Place 2 sprays into both nostrils as needed for allergies or rhinitis., Disp: , Rfl:  .  furosemide (LASIX) 40 MG tablet, TAKE 1 TABLET BY MOUTH THREE TIMES DAILY, Disp: 90 tablet, Rfl: 11 .  metoprolol succinate (TOPROL-XL) 50 MG 24 hr tablet, Take 1/2 tablet (25 mg) by mouth two times a day. Take with or immediately following a meal., Disp: 90 tablet, Rfl: 3 .  nitroGLYCERIN (NITROSTAT) 0.3 MG SL tablet, Place 1 tablet (0.3 mg total) under the tongue every 5 (five) minutes as needed for chest pain., Disp: 30 tablet, Rfl: 3 .  spironolactone (ALDACTONE) 25 MG tablet, Take 1 tablet by mouth once daily, Disp: 90 tablet, Rfl: 1 .  traMADol (ULTRAM) 50 MG tablet, Take 1 tablet (50 mg total) every 6 (six) hours as needed by mouth., Disp: 30 tablet, Rfl: 5 .  traZODone (DESYREL) 50 MG tablet, Take 0.5-1 tablets (25-50 mg total) by mouth at bedtime as needed for sleep., Disp: 30 tablet, Rfl: 3 .  warfarin (COUMADIN) 10 MG tablet, Take 1 tablet (10 mg total) by mouth as directed., Disp: 30  tablet, Rfl: 3 .  warfarin (COUMADIN) 7.5 MG tablet, Take 1 tablet (7.5 mg total) by mouth daily., Disp: 90 tablet, Rfl: 3  Ct Chest W Contrast  Result Date: 01/17/2019 CLINICAL DATA:  Right upper lobe lung cancer. Status post SBRT with modification of the radiation field to include the hypermetabolic pulmonary nodule seen on previous PET-CT EXAM: CT CHEST WITH CONTRAST TECHNIQUE: Multidetector CT imaging of the chest was performed during intravenous contrast administration. CONTRAST:  44mL OMNIPAQUE IOHEXOL 300 MG/ML  SOLN COMPARISON:  PET-CT 10/05/2018.  Chest CT 09/19/2018. FINDINGS: Cardiovascular: Heart size upper normal. Status post mitral valve replacement. Permanent pacemaker noted. Prominent main pulmonary arteries suspicious for pulmonary arterial hypertension. Mediastinum/Nodes: 7 mm short axis right paratracheal node (34/2) not enlarged by CT criteria but progressive in the interval. 10 mm short axis precarinal lymph node also shows mild interval progression. No left hilar lymphadenopathy. Soft tissue attenuation in the anterior right hilum compatible with post treatment change. The esophagus has normal imaging features. 15 mm exophytic nodule identified posterior left thyroid gland. Lungs/Pleura: The central tracheobronchial airways are patent. Interval evolution of radiation scarring in the right upper lobe. This is become more confluent in the interval in the small peripheral right upper lobe pulmonary nodule seen on the previous study are no longer evident, likely incorporated into the confluent radiation changes in the right upper lobe. 3 mm nodule noted left upper lobe (36/3). No pleural effusion. Upper Abdomen: Probable perfusion anomaly noted along the falciform ligament of the liver. Musculoskeletal: No worrisome lytic or sclerotic osseous abnormality. IMPRESSION: 1. Interval increase in consolidative post radiation change in the right upper lobe, consistent with modification of the  radiation field to include the pulmonary nodules seen on PET-CT of 10/05/2018. Discrete nodules are no longer evident and have either resolved or become incorporated into the evolving post radiation fibrosis. 2. Tiny 3 mm left upper lobe nodule stable since chest CT of 09/19/2018. Continued attention on follow-up recommended. 3. Prominent main pulmonary arteries, suspicious for pulmonary arterial hypertension. 4.  Aortic Atherosclerois (ICD10-170.0) 5.  Emphysema. ([id number]) Electronically Signed   By:  Misty Stanley M.D.   On: 01/17/2019 17:12    No images are attached to the encounter.   CMP Latest Ref Rng & Units 01/17/2019  Glucose 65 - 99 mg/dL -  BUN 6 - 24 mg/dL -  Creatinine 0.44 - 1.00 mg/dL 0.90  Sodium 134 - 144 mmol/L -  Potassium 3.5 - 5.2 mmol/L -  Chloride 96 - 106 mmol/L -  CO2 20 - 29 mmol/L -  Calcium 8.7 - 10.2 mg/dL -  Total Protein 6.0 - 8.5 g/dL -  Total Bilirubin 0.0 - 1.2 mg/dL -  Alkaline Phos 39 - 117 IU/L -  AST 0 - 40 IU/L -  ALT 0 - 32 IU/L -   CBC Latest Ref Rng & Units 08/21/2018  WBC 4.0 - 10.5 K/uL 5.4  Hemoglobin 12.0 - 15.0 g/dL 12.5  Hematocrit 36.0 - 46.0 % 39.0  Platelets 150 - 400 K/uL 167     Observation/objective: Appears in no acute distress of a video visit today.  Breathing is nonlabored  Assessment and plan: Patient is 58 year old female with a history of adenocarcinoma left upper lobe of the lung status post SBRT.  This is a follow-up visit to discuss results of CT scan  I discussed CT scan chest results with the patient which shows Increase in consolidative postradiation change in the right upper lobe and no discrete nodules were evident.  There is mild increase in the size of her paratracheal adenopathy which we will continue to monitor.  Patient has a repeat CT chest scheduled in 2 months time on 03/08/2019.  I will discuss her scans about and see her following the scan  Follow-up instructions: Follow-up on 03/15/2017 no labs  I  discussed the assessment and treatment plan with the patient. The patient was provided an opportunity to ask questions and all were answered. The patient agreed with the plan and demonstrated an understanding of the instructions.   The patient was advised to call back or seek an in-person evaluation if the symptoms worsen or if the condition fails to improve as anticipated.    Visit Diagnosis: 1. Encounter for follow-up surveillance of lung cancer     Dr. Randa Evens, MD, MPH [facility] Pager- [phone] 01/21/2019 8:02 AM

## 2019-01-23 ENCOUNTER — Other Ambulatory Visit: Payer: Self-pay

## 2019-01-23 ENCOUNTER — Ambulatory Visit (INDEPENDENT_AMBULATORY_CARE_PROVIDER_SITE_OTHER): Payer: Medicare HMO | Admitting: *Deleted

## 2019-01-23 DIAGNOSIS — Z952 Presence of prosthetic heart valve: Secondary | ICD-10-CM

## 2019-01-23 LAB — POCT INR
INR: 3.1 — AB (ref 2.0–3.0)
PT: 36.8

## 2019-01-23 NOTE — Patient Instructions (Signed)
Dx:  Mechanical heart valve Current Coumadin dose:  7.5mg  daily PT:  36.8 INR: 3.1 Today's Changes: NO CHANGE   Recheck: 4 weeks

## 2019-01-29 ENCOUNTER — Ambulatory Visit (INDEPENDENT_AMBULATORY_CARE_PROVIDER_SITE_OTHER): Payer: Medicare HMO

## 2019-01-29 DIAGNOSIS — I5022 Chronic systolic (congestive) heart failure: Secondary | ICD-10-CM

## 2019-01-29 DIAGNOSIS — Z95810 Presence of automatic (implantable) cardiac defibrillator: Secondary | ICD-10-CM

## 2019-01-31 NOTE — Progress Notes (Signed)
EPIC Encounter for ICM Monitoring  Patient Name: Vanessa Carlson is a 58 y.o. female Date: 01/31/2019 Primary Care Physican: Virginia Crews, MD Primary Cardiologist: Carlis Abbott, NP HF Electrophysiologist: Caryl Comes Last Weight:224lbs       Transmission reviewed and results sent via mychart.  Thoracic impedance normal.   Prescribed:Furosemide 40 mg 1 tablet 3 times daily.   Labs: 08/18/2018 Creatinine 1.06, BUN 10, Potassium 3.9, Sodium 142, eGFR 58-67 03/22/2018 Creatinine 0.89, BUN 14, Potassium 3.8, Sodium 141, eGFR >60 12/21/2017 Creatinine 0.91, BUN 9, Potassium 3.9, Sodium 143, eGFR 70-81 08/31/2017 Creatinine 1.06, BUN 14, Potassium 3.9, Sodium 139, EGFR 57->60  Recommendations:None.  Follow-up plan: ICM clinic phone appointment on7/01/2019.  Copy of ICM check sent to [place].  3 month ICM trend: 01/29/2019    1 Year ICM trend:       Vanessa Billings, RN 01/31/2019 1:27 PM

## 2019-02-04 ENCOUNTER — Encounter: Payer: Self-pay | Admitting: Family Medicine

## 2019-02-16 LAB — CUP PACEART INCLINIC DEVICE CHECK
Date Time Interrogation Session: 20200619120947
Implantable Lead Implant Date: 20160802
Implantable Pulse Generator Implant Date: 20160802
MDC IDC LEAD LOCATION: 753860

## 2019-02-20 ENCOUNTER — Ambulatory Visit (INDEPENDENT_AMBULATORY_CARE_PROVIDER_SITE_OTHER): Payer: Medicare HMO | Admitting: *Deleted

## 2019-02-20 ENCOUNTER — Other Ambulatory Visit: Payer: Self-pay

## 2019-02-20 DIAGNOSIS — Z952 Presence of prosthetic heart valve: Secondary | ICD-10-CM | POA: Diagnosis not present

## 2019-02-20 LAB — POCT INR
INR: 3.2 — AB (ref 2.0–3.0)
PT: 38.9

## 2019-02-20 NOTE — Patient Instructions (Signed)
Dx:  Mechanical heart valve Current Coumadin dose:  7.5mg  daily PT:  38.9 INR: 3.2 Today's Changes: NO CHANGE, however pt was advised (per Simona Huh) to add greens or a salad to her diet 2 times a week and return in 2 weeks to recheck her PT/INR   Recheck: 2 weeks

## 2019-02-24 DIAGNOSIS — Z79899 Other long term (current) drug therapy: Secondary | ICD-10-CM

## 2019-02-24 DIAGNOSIS — I5022 Chronic systolic (congestive) heart failure: Secondary | ICD-10-CM

## 2019-02-26 ENCOUNTER — Telehealth: Payer: Self-pay | Admitting: Internal Medicine

## 2019-02-26 ENCOUNTER — Encounter: Payer: Self-pay | Admitting: Family Medicine

## 2019-02-26 MED ORDER — FUROSEMIDE 40 MG PO TABS: 80.0000 mg | ORAL_TABLET | Freq: Two times a day (BID) | ORAL | 2 refills | Status: DC

## 2019-02-26 NOTE — Telephone Encounter (Signed)
No answer. Left detail message with recommendations and that I would send via MyChart as well, ok per DPR, and to call back if any questions.

## 2019-02-26 NOTE — Telephone Encounter (Signed)
Please let Ms. Ellington know that I have reviewed her images of leg swelling.  There is clearly edema present.  I recommend that we switch her furosemide from 40 mg TID to 80 mg BID.  She should keep limiting her salt intake and also elevate her legs whenever possible.  She should try to have her come in for a BMP later this week to reassess her renal function and electrolytes.  [name], MD [facility] Pager: [phone]

## 2019-02-26 NOTE — Telephone Encounter (Signed)
°*  STAT* If patient is at the pharmacy, call can be transferred to refill team.   1. Which medications need to be refilled? (please list name of each medication and dose if known) furosemide 80 mg po BID   2. Which pharmacy/location (including street and city if local pharmacy) is medication to be sent to? [place]   3. Do they need a 30 day or 90 day supply? [place]

## 2019-03-01 ENCOUNTER — Other Ambulatory Visit: Payer: Self-pay

## 2019-03-01 ENCOUNTER — Telehealth: Payer: Self-pay

## 2019-03-01 ENCOUNTER — Other Ambulatory Visit
Admission: RE | Admit: 2019-03-01 | Discharge: 2019-03-01 | Disposition: A | Discharge status: Home / Self Care | Payer: Medicare HMO | Source: Ambulatory Visit | Attending: Internal Medicine | Admitting: Internal Medicine

## 2019-03-01 DIAGNOSIS — I5022 Chronic systolic (congestive) heart failure: Secondary | ICD-10-CM | POA: Diagnosis not present

## 2019-03-01 DIAGNOSIS — Z79899 Other long term (current) drug therapy: Secondary | ICD-10-CM | POA: Diagnosis not present

## 2019-03-01 LAB — BASIC METABOLIC PANEL
Anion gap: 8 (ref 5–15)
BUN: 15 mg/dL (ref 6–20)
CO2: 25 mmol/L (ref 22–32)
Calcium: 8.9 mg/dL (ref 8.9–10.3)
Chloride: 107 mmol/L (ref 98–111)
Creatinine, Ser: 0.98 mg/dL (ref 0.44–1.00)
GFR calc Af Amer: >60 mL/min (ref >60)
GFR calc non Af Amer: >60 mL/min (ref >60)
Glucose, Bld: 135 mg/dL — ABNORMAL HIGH (ref 70–99)
Potassium: 4.5 mmol/L (ref 3.5–5.1)
Sodium: 140 mmol/L (ref 135–145)

## 2019-03-01 NOTE — Telephone Encounter (Signed)
LMTCB to schedule a OV per Dr. Brita Romp regarding patient message sent on 03/01/2019.

## 2019-03-05 ENCOUNTER — Telehealth: Payer: Self-pay | Admitting: *Deleted

## 2019-03-05 NOTE — Telephone Encounter (Signed)
No answer. Left detail message with results, ok per DPR, and to call back if any questions and to schedule follow up appointment. Routing to scheduling to reach out to patient about follow up.

## 2019-03-05 NOTE — Telephone Encounter (Signed)
-----   Message from [name], MD sent at 03/02/2019  8:46 AM EDT ----- No significant abnormality.  I recommend continuing furosemide 80 mg BID and arranging for follow-up with me or an APP in the next 1-2 weeks to reassess her leg edema in lieu of scheduled appointment with Darylene Price, NP, later this month.

## 2019-03-06 ENCOUNTER — Other Ambulatory Visit: Payer: Self-pay

## 2019-03-06 ENCOUNTER — Ambulatory Visit (INDEPENDENT_AMBULATORY_CARE_PROVIDER_SITE_OTHER): Payer: Medicare HMO

## 2019-03-06 DIAGNOSIS — Z952 Presence of prosthetic heart valve: Secondary | ICD-10-CM | POA: Diagnosis not present

## 2019-03-06 LAB — POCT INR
INR: 2.7 (ref 2.0–3.0)
PT: 32

## 2019-03-06 NOTE — Telephone Encounter (Signed)
lmov to schedule  °

## 2019-03-06 NOTE — Telephone Encounter (Signed)
Patient scheduled for 07/09 with Tawanna Sat

## 2019-03-07 NOTE — Telephone Encounter (Signed)
Scheduled

## 2019-03-08 ENCOUNTER — Ambulatory Visit
Admission: RE | Admit: 2019-03-08 | Discharge: 2019-03-08 | Disposition: A | Discharge status: Home / Self Care | Payer: Medicare HMO | Source: Ambulatory Visit | Attending: Radiation Oncology | Admitting: Radiation Oncology

## 2019-03-08 ENCOUNTER — Ambulatory Visit (INDEPENDENT_AMBULATORY_CARE_PROVIDER_SITE_OTHER): Payer: Medicare HMO | Admitting: Physician Assistant

## 2019-03-08 ENCOUNTER — Other Ambulatory Visit: Payer: Self-pay

## 2019-03-08 ENCOUNTER — Telehealth: Payer: Self-pay

## 2019-03-08 ENCOUNTER — Encounter: Payer: Self-pay | Admitting: Physician Assistant

## 2019-03-08 VITALS — BP 122/66 | HR 76 | Temp 98.7°F | Resp 16 | Wt 233.0 lb

## 2019-03-08 DIAGNOSIS — G6282 Radiation-induced polyneuropathy: Secondary | ICD-10-CM | POA: Diagnosis not present

## 2019-03-08 DIAGNOSIS — C3411 Malignant neoplasm of upper lobe, right bronchus or lung: Secondary | ICD-10-CM | POA: Diagnosis not present

## 2019-03-08 MED ORDER — GABAPENTIN 300 MG PO CAPS: 300.0000 mg | ORAL_CAPSULE | Freq: Every day | ORAL | 3 refills | Status: DC

## 2019-03-08 MED ORDER — IOHEXOL 300 MG/ML  SOLN
75.0000 mL | Freq: Once | INTRAMUSCULAR | Status: AC | PRN
  Administered 2019-03-08: 75 mL via INTRAVENOUS

## 2019-03-08 MED ORDER — LIDOCAINE 5 % EX CREA: 1.0000 "application " | TOPICAL_CREAM | Freq: Four times a day (QID) | CUTANEOUS | 3 refills | Status: DC | PRN

## 2019-03-08 NOTE — Progress Notes (Signed)
Patient: Vanessa Carlson Female    DOB: 05-01-1961   58 y.o.   MRN: [id number] Visit Date: 03/08/2019  Today's Provider: Mar Daring, PA-C   Chief Complaint  Patient presents with  . Arm Pain    Under right arm   Subjective:     Back Pain This is a new problem. Episode onset: Pt states it started a few days ago. The problem is unchanged. The pain is present in the lumbar spine (Lower right side). The quality of the pain is described as shooting and cramping. The pain does not radiate. The symptoms are aggravated by position and twisting. Pertinent negatives include no abdominal pain, bladder incontinence, bowel incontinence, chest pain, dysuria, fever, leg pain, numbness, paresis, paresthesias, perianal numbness, tingling or weakness. She has tried muscle relaxant, ice and heat for the symptoms. The treatment provided no relief.       Follow up for Axillary pain, right  The patient was last seen for this 6 months ago. Changes made at last visit include treat with ice and topical NSAID.  She reports excellent compliance with treatment. She feels that condition is Worse.  Pt states the topical NSAID worked great in the beginning but is no longer working. She is not having side effects.   ------------------------------------------------------------------------------------   No Known Allergies   Current Outpatient Medications:  .  aspirin 81 MG chewable tablet, Chew 81 mg by mouth daily., Disp: , Rfl:  .  atorvastatin (LIPITOR) 80 MG tablet, TAKE 1 TABLET BY MOUTH ONCE DAILY, Disp: 90 tablet, Rfl: 1 .  diclofenac sodium (VOLTAREN) 1 % GEL, Apply 2 g topically 4 (four) times daily., Disp: 100 g, Rfl: 2 .  ENTRESTO 24-26 MG, TAKE 1 TABLET BY MOUTH TWICE DAILY, Disp: 180 tablet, Rfl: 3 .  fluticasone (FLONASE) 50 MCG/ACT nasal spray, Place 2 sprays into both nostrils as needed for allergies or rhinitis., Disp: , Rfl:  .  furosemide (LASIX) 40 MG tablet, Take 2  tablets (80 mg total) by mouth 2 (two) times daily., Disp: 360 tablet, Rfl: 2 .  metoprolol succinate (TOPROL-XL) 50 MG 24 hr tablet, Take 1/2 tablet (25 mg) by mouth two times a day. Take with or immediately following a meal., Disp: 90 tablet, Rfl: 3 .  nitroGLYCERIN (NITROSTAT) 0.3 MG SL tablet, Place 1 tablet (0.3 mg total) under the tongue every 5 (five) minutes as needed for chest pain., Disp: 30 tablet, Rfl: 3 .  spironolactone (ALDACTONE) 25 MG tablet, Take 1 tablet by mouth once daily, Disp: 90 tablet, Rfl: 1 .  traMADol (ULTRAM) 50 MG tablet, Take 1 tablet (50 mg total) every 6 (six) hours as needed by mouth., Disp: 30 tablet, Rfl: 5 .  traZODone (DESYREL) 50 MG tablet, Take 0.5-1 tablets (25-50 mg total) by mouth at bedtime as needed for sleep., Disp: 30 tablet, Rfl: 3 .  warfarin (COUMADIN) 10 MG tablet, Take 1 tablet (10 mg total) by mouth as directed., Disp: 30 tablet, Rfl: 3 .  warfarin (COUMADIN) 7.5 MG tablet, Take 1 tablet (7.5 mg total) by mouth daily., Disp: 90 tablet, Rfl: 3  Review of Systems  Constitutional: Negative.  Negative for appetite change, chills, fatigue and fever.  Respiratory: Negative for chest tightness and shortness of breath.   Cardiovascular: Negative for chest pain and palpitations.  Gastrointestinal: Negative.  Negative for abdominal pain, bowel incontinence, nausea and vomiting.  Genitourinary: Negative for bladder incontinence and dysuria.  Musculoskeletal: Positive for arthralgias,  back pain and myalgias. Negative for gait problem, joint swelling, neck pain and neck stiffness.  Skin: Negative.   Neurological: Negative for dizziness, tingling, weakness, numbness and paresthesias.  Hematological: Negative for adenopathy.    Social History   Tobacco Use  . Smoking status: Former Smoker    Packs/day: 1.00    Years: 4.00    Pack years: 4.00    Types: Cigarettes    Quit date: 1998    Years since quitting: 22.5  . Smokeless tobacco: Never Used   Substance Use Topics  . Alcohol use: No      Objective:   BP 122/66 (BP Location: Right Arm, Patient Position: Sitting, Cuff Size: Large)   Pulse 76   Temp 98.7 F (37.1 C) (Oral)   Resp 16   Wt 233 lb (105.7 kg)   LMP  (LMP Unknown)   BMI 41.94 kg/m  Vitals:   03/08/19 1037  BP: 122/66  Pulse: 76  Resp: 16  Temp: 98.7 F (37.1 C)  TempSrc: Oral  Weight: 233 lb (105.7 kg)     Physical Exam Vitals signs reviewed.  Constitutional:      General: She is not in acute distress.    Appearance: Normal appearance. She is well-developed. She is obese. She is not ill-appearing or diaphoretic.  Neck:     Musculoskeletal: Normal range of motion and neck supple.     Thyroid: No thyromegaly.     Vascular: No JVD.     Trachea: No tracheal deviation.  Cardiovascular:     Rate and Rhythm: Normal rate and regular rhythm.     Heart sounds: Murmur present. No friction rub. No gallop.   Pulmonary:     Effort: Pulmonary effort is normal. No respiratory distress.     Breath sounds: Normal breath sounds. No wheezing or rales.  Musculoskeletal:     Right shoulder: Normal. She exhibits normal range of motion, no tenderness, no bony tenderness, no swelling, no effusion, no pain, no spasm, normal pulse and normal strength.  Lymphadenopathy:     Cervical: No cervical adenopathy.  Neurological:     Mental Status: She is alert.      No results found for any visits on 03/08/19.     Assessment & Plan    1. 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 Symptoms started near the end of her RXT. 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 Discussed course and possibly not able to resolve pain completely but make it tolerable. Will start Gabapentin as below to see if this helps lessen symptoms and will give lidocaine cream for local treatment. She is to call if symptoms worsen.  - gabapentin  (NEURONTIN) 300 MG capsule; Take 1 capsule (300 mg total) by mouth at bedtime.  Dispense: 90 capsule; Refill: 3 - Lidocaine 5 % CREA; Apply 1 application topically every 6 (six) hours as needed.  Dispense: 113 g; Refill: [place], PA-C  [facility]

## 2019-03-08 NOTE — Patient Instructions (Signed)
Neuropathic Pain Neuropathic pain is pain caused by damage to the nerves that are responsible for certain sensations in your body (sensory nerves). The pain can be caused by:  Damage to the sensory nerves that send signals to your spinal cord and brain (peripheral nervous system).  Damage to the sensory nerves in your brain or spinal cord (central nervous system). Neuropathic pain can make you more sensitive to pain. Even a minor sensation can feel very painful. This is usually a long-term condition that can be difficult to treat. The type of pain differs from person to person. It may:  Start suddenly (acute), or it may develop slowly and last for a long time (chronic).  Come and go as damaged nerves heal, or it may stay at the same level for years.  Cause emotional distress, loss of sleep, and a lower quality of life. What are the causes? The most common cause of this condition is diabetes. Many other diseases and conditions can also cause neuropathic pain. Causes of neuropathic pain can be classified as:  Toxic. This is caused by medicines and chemicals. The most common cause of toxic neuropathic pain is damage from cancer treatments (chemotherapy).  Metabolic. This can be caused by: ? Diabetes. This is the most common disease that damages the nerves. ? Lack of vitamin B from long-term alcohol abuse.  Traumatic. Any injury that cuts, crushes, or stretches a nerve can cause damage and pain. A common example is feeling pain after losing an arm or leg (phantom limb pain).  Compression-related. If a sensory nerve gets trapped or compressed for a long period of time, the blood supply to the nerve can be cut off.  Vascular. Many blood vessel diseases can cause neuropathic pain by decreasing blood supply and oxygen to nerves.  Autoimmune. This type of pain results from diseases in which the body's defense system (immune system) mistakenly attacks sensory nerves. Examples of autoimmune diseases  that can cause neuropathic pain include lupus and multiple sclerosis.  Infectious. Many types of viral infections can damage sensory nerves and cause pain. Shingles infection is a common cause of this type of pain.  Inherited. Neuropathic pain can be a symptom of many diseases that are passed down through families (genetic). What increases the risk? You are more likely to develop this condition if:  You have diabetes.  You smoke.  You drink too much alcohol.  You are taking certain medicines, including medicines that kill cancer cells (chemotherapy) or that treat immune system disorders. What are the signs or symptoms? The main symptom is pain. Neuropathic pain is often described as:  Burning.  Shock-like.  Stinging.  Hot or cold.  Itching. How is this diagnosed? No single test can diagnose neuropathic pain. It is diagnosed based on:  Physical exam and your symptoms. Your health care provider will ask you about your pain. You may be asked to use a pain scale to describe how bad your pain is.  Tests. These may be done to see if you have a high sensitivity to pain and to help find the cause and location of any sensory nerve damage. They include: ? Nerve conduction studies to test how well nerve signals travel through your sensory nerves (electrodiagnostic testing). ? Stimulating your sensory nerves through electrodes on your skin and measuring the response in your spinal cord and brain (somatosensory evoked potential).  Imaging studies, such as: ? X-rays. ? CT scan. ? MRI. How is this treated? Treatment for neuropathic pain may change  over time. You may need to try different treatment options or a combination of treatments. Some options include:  Treating the underlying cause of the neuropathy, such as diabetes, kidney disease, or vitamin deficiencies.  Stopping medicines that can cause neuropathy, such as chemotherapy.  Medicine to relieve pain. Medicines may  include: ? Prescription or over-the-counter pain medicine. ? Anti-seizure medicine. ? Antidepressant medicines. ? Pain-relieving patches that are applied to painful areas of skin. ? A medicine to numb the area (local anesthetic), which can be injected as a nerve block.  Transcutaneous nerve stimulation. This uses electrical currents to block painful nerve signals. The treatment is painless.  Alternative treatments, such as: ? Acupuncture. ? Meditation. ? Massage. ? Physical therapy. ? Pain management programs. ? Counseling. Follow these instructions at home: Medicines   Take over-the-counter and prescription medicines only as told by your health care provider.  Do not drive or use heavy machinery while taking prescription pain medicine.  If you are taking prescription pain medicine, take actions to prevent or treat constipation. Your health care provider may recommend that you: ? Drink enough fluid to keep your urine pale yellow. ? Eat foods that are high in fiber, such as fresh fruits and vegetables, whole grains, and beans. ? Limit foods that are high in fat and processed sugars, such as fried or sweet foods. ? Take an over-the-counter or prescription medicine for constipation. Lifestyle   Have a good support system at home.  Consider joining a chronic pain support group.  Do not use any products that contain nicotine or tobacco, such as cigarettes and e-cigarettes. If you need help quitting, ask your health care provider.  Do not drink alcohol. General instructions  Learn as much as you can about your condition.  Work closely with all your health care providers to find the treatment plan that works best for you.  Ask your health care provider what activities are safe for you.  Keep all follow-up visits as told by your health care provider. This is important. Contact a health care provider if:  Your pain treatments are not working.  You are having side effects  from your medicines.  You are struggling with tiredness (fatigue), mood changes, depression, or anxiety. Summary  Neuropathic pain is pain caused by damage to the nerves that are responsible for certain sensations in your body (sensory nerves).  Neuropathic pain may come and go as damaged nerves heal, or it may stay at the same level for years.  Neuropathic pain is usually a long-term condition that can be difficult to treat. Consider joining a chronic pain support group. This information is not intended to replace advice given to you by your health care provider. Make sure you discuss any questions you have with your health care provider. Document Released: 05/13/2004 Document Revised: 12/07/2018 Document Reviewed: 09/02/2017 Elsevier Patient Education  [place].   Gabapentin capsules or tablets What is this medicine? GABAPENTIN (GA ba pen tin) is used to control seizures in certain types of epilepsy. It is also used to treat certain types of nerve pain. This medicine may be used for other purposes; ask your health care provider or pharmacist if you have questions. COMMON BRAND NAME(S): Active-PAC with Gabapentin, Gabarone, Neurontin What should I tell my health care provider before I take this medicine? They need to know if you have any of these conditions:  history of drug abuse or alcohol abuse problem  kidney disease  lung or breathing disease  suicidal thoughts,  plans, or attempt; a previous suicide attempt by you or a family member  an unusual or allergic reaction to gabapentin, other medicines, foods, dyes, or preservatives  pregnant or trying to get pregnant  breast-feeding How should I use this medicine? Take this medicine by mouth with a glass of water. Follow the directions on the prescription label. You can take it with or without food. If it upsets your stomach, take it with food. Take your medicine at regular intervals. Do not take it more often than  directed. Do not stop taking except on your doctor's advice. If you are directed to break the 600 or 800 mg tablets in half as part of your dose, the extra half tablet should be used for the next dose. If you have not used the extra half tablet within 28 days, it should be thrown away. A special MedGuide will be given to you by the pharmacist with each prescription and refill. Be sure to read this information carefully each time. Talk to your pediatrician regarding the use of this medicine in children. While this drug may be prescribed for children as young as 3 years for selected conditions, precautions do apply. Overdosage: If you think you have taken too much of this medicine contact a poison control center or emergency room at once. NOTE: This medicine is only for you. Do not share this medicine with others. What if I miss a dose? If you miss a dose, take it as soon as you can. If it is almost time for your next dose, take only that dose. Do not take double or extra doses. What may interact with this medicine? This medicine may interact with the following medications:  alcohol  antihistamines for allergy, cough, and cold  certain medicines for anxiety or sleep  certain medicines for depression like amitriptyline, fluoxetine, sertraline  certain medicines for seizures like phenobarbital, primidone  certain medicines for stomach problems  general anesthetics like halothane, isoflurane, methoxyflurane, propofol  local anesthetics like lidocaine, pramoxine, tetracaine  medicines that relax muscles for surgery  narcotic medicines for pain  phenothiazines like chlorpromazine, mesoridazine, prochlorperazine, thioridazine This list may not describe all possible interactions. Give your health care provider a list of all the medicines, herbs, non-prescription drugs, or dietary supplements you use. Also tell them if you smoke, drink alcohol, or use illegal drugs. Some items may interact with  your medicine. What should I watch for while using this medicine? Visit your doctor or health care provider for regular checks on your progress. You may want to keep a record at home of how you feel your condition is responding to treatment. You may want to share this information with your doctor or health care provider at each visit. You should contact your doctor or health care provider if your seizures get worse or if you have any new types of seizures. Do not stop taking this medicine or any of your seizure medicines unless instructed by your doctor or health care provider. Stopping your medicine suddenly can increase your seizures or their severity. This medicine may cause serious skin reactions. They can happen weeks to months after starting the medicine. Contact your health care provider right away if you notice fevers or flu-like symptoms with a rash. The rash may be red or purple and then turn into blisters or peeling of the skin. Or, you might notice a red rash with swelling of the face, lips or lymph nodes in your neck or under your arms. Wear a  medical identification bracelet or chain if you are taking this medicine for seizures, and carry a card that lists all your medications. You may get drowsy, dizzy, or have blurred vision. Do not drive, use machinery, or do anything that needs mental alertness until you know how this medicine affects you. To reduce dizzy or fainting spells, do not sit or stand up quickly, especially if you are an older patient. Alcohol can increase drowsiness and dizziness. Avoid alcoholic drinks. Your mouth may get dry. Chewing sugarless gum or sucking hard candy, and drinking plenty of water will help. The use of this medicine may increase the chance of suicidal thoughts or actions. Pay special attention to how you are responding while on this medicine. Any worsening of mood, or thoughts of suicide or dying should be reported to your health care provider right away. Women  who become pregnant while using this medicine may enroll in the [place] Pregnancy Registry by calling [phone]. This registry collects information about the safety of antiepileptic drug use during pregnancy. What side effects may I notice from receiving this medicine? Side effects that you should report to your doctor or health care professional as soon as possible:  allergic reactions like skin rash, itching or hives, swelling of the face, lips, or tongue  breathing problems  rash, fever, and swollen lymph nodes  redness, blistering, peeling or loosening of the skin, including inside the mouth  suicidal thoughts, mood changes Side effects that usually do not require medical attention (report to your doctor or health care professional if they continue or are bothersome):  dizziness  drowsiness  headache  nausea, vomiting  swelling of ankles, feet, hands  tiredness This list may not describe all possible side effects. Call your doctor for medical advice about side effects. You may report side effects to FDA at 1-800-FDA-1088. Where should I keep my medicine? Keep out of reach of children. This medicine may cause accidental overdose and death if it taken by other adults, children, or pets. Mix any unused medicine with a substance like cat litter or coffee grounds. Then throw the medicine away in a sealed container like a sealed bag or a coffee can with a lid. Do not use the medicine after the expiration date. Store at room temperature between 15 and 30 degrees C (59 and 86 degrees F). NOTE: This sheet is a summary. It may not cover all possible information. If you have questions about this medicine, talk to your doctor, pharmacist, or health care provider.  2020 Elsevier/Gold Standard (2018-11-17 14:16:43)

## 2019-03-08 NOTE — Telephone Encounter (Signed)

## 2019-03-08 NOTE — Telephone Encounter (Signed)
[facility] re Pre-screening.

## 2019-03-09 ENCOUNTER — Ambulatory Visit (INDEPENDENT_AMBULATORY_CARE_PROVIDER_SITE_OTHER): Payer: Medicare HMO | Admitting: Nurse Practitioner

## 2019-03-09 ENCOUNTER — Encounter: Payer: Self-pay | Admitting: Nurse Practitioner

## 2019-03-09 VITALS — BP 134/72 | HR 81 | Ht 62.0 in | Wt 234.0 lb

## 2019-03-09 DIAGNOSIS — I2510 Atherosclerotic heart disease of native coronary artery without angina pectoris: Secondary | ICD-10-CM | POA: Diagnosis not present

## 2019-03-09 DIAGNOSIS — I255 Ischemic cardiomyopathy: Secondary | ICD-10-CM

## 2019-03-09 DIAGNOSIS — R002 Palpitations: Secondary | ICD-10-CM

## 2019-03-09 DIAGNOSIS — E785 Hyperlipidemia, unspecified: Secondary | ICD-10-CM

## 2019-03-09 DIAGNOSIS — I10 Essential (primary) hypertension: Secondary | ICD-10-CM

## 2019-03-09 DIAGNOSIS — Z952 Presence of prosthetic heart valve: Secondary | ICD-10-CM | POA: Diagnosis not present

## 2019-03-09 DIAGNOSIS — I5022 Chronic systolic (congestive) heart failure: Secondary | ICD-10-CM

## 2019-03-09 NOTE — Patient Instructions (Signed)
Medication Instructions:  - Your physician recommends that you continue on your current medications as directed. Please refer to the Current Medication list given to you today.  If you need a refill on your cardiac medications before your next appointment, please call your pharmacy.   Lab work: - none ordered  If you have labs (blood work) drawn today and your tests are completely normal, you will receive your results only by: [name] MyChart Message (if you have MyChart) OR . A paper copy in the mail If you have any lab test that is abnormal or we need to change your treatment, we will call you to review the results.  Testing/Procedures: - none ordered  Follow-Up: At [facility], you and your health needs are our priority.  As part of our continuing mission to provide you with exceptional heart care, we have created designated Provider Care Teams.  These Care Teams include your primary Cardiologist (physician) and Advanced Practice Providers (APPs -  Physician Assistants and Nurse Practitioners) who all work together to provide you with the care you need, when you need it. You will need a follow up appointment in 3 months. (October)  Please call our office 2 months in advance to schedule this appointment. (call in mid August to schedule)  You may see [name], MD or one of the following Advanced Practice Providers on your designated Care Team:   Murray Hodgkins, NP Christell Faith, PA-C . Marrianne Mood, PA-C  Any Other Special Instructions Will Be Listed Below (If Applicable). - N/A

## 2019-03-09 NOTE — Progress Notes (Signed)
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  Past Medical History    Past Medical History:  Diagnosis Date  . AICD (automatic cardioverter/defibrillator) present   . Anterior myocardial infarction [facility])    a. 10/2014 - occluded LAD, complicated by cardiogenic shock-->Med Rx as interventional team was unable to open LAD.  [name] Cancer of upper lobe of right lung ([place]) 09/2017   radiation therapy right side  . Cervical cancer ([place])    in remission for ~20 yrs, s/p hysterectomy  . Chronic combined systolic and diastolic CHF (congestive heart failure) ([place])    a. 10/2015 Echo: EF 20-25%, sev diast dysfxn, mildly reduced RV fxn. Nl MV prosthesis.  . Coronary artery disease    a. 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  . Essential hypertension   . Ischemic cardiomyopathy    a. 03/2015 s/p MDT single lead AICD;  b. 10/2015 Echo: EF 20-25%.  . Mass of upper lobe of right lung    a. noted on CXR & CT 08/2017 w/ abnl PET CT.  [name] Mitral valve disease    a. 2006 s/p MVR w/ 39mm SJM bileaflet mechanical valve-->chronic coumadin/ASA.  [name] Personal history of radiation therapy    f/u lung ca   Past Surgical History:  Procedure Laterality Date  . APPENDECTOMY  1998  .  BREAST EXCISIONAL BIOPSY Left 80s   benign  . CARDIAC CATHETERIZATION    . CARDIAC DEFIBRILLATOR PLACEMENT    . RADICAL HYSTERECTOMY  1998  . SHOULDER SURGERY Bilateral    rotator cuff tears  . VALVE REPLACEMENT     Mitral valve; 27 mm St. Jude bileaflet valve    Allergies  No Known Allergies  History of Present Illness    58 year old female with above complex past medical history including mitral valve disease status post mitral valve replacement in 2006 with mechanical mitral valve.  She has been on chronic Coumadin and aspirin since.  Other history includes CAD status post anterior ST segment elevation myocardial infarction in 2016.  The LAD was found to be occluded proximally and this has been medically managed.  In the setting of anterior MI, she developed LV dysfunction and ischemic cardiomyopathy with an EF of 20 to 25% in 12/2017.  She is status post AICD placement in August 2016.  Other history includes hypertension, combined heart failure, cervical cancer status post hysterectomy, and bronchogenic carcinoma status post radiation.  She was last seen via telemedicine visit in early April, at which time she was doing reasonably well.  She did note occasional palpitations.  Since then, she has mostly continued to do well but contacted [place] on June 27 due to increasing lower extremity edema.  She sent pictures, which were reviewed by Dr. Saunders Revel.  She was advised to increase Lasix to 80 mg twice daily (up from 40 mg 3  times daily).  With this, within about a week, she had resolution of lower extremity swelling.  Follow-up basic metabolic panel at that time showed stable renal function and potassium.  She has since remained on 80 mg twice daily and has been doing well.  She denies any significant dyspnea on exertion and has been exercising some at home.  She continues to have occasional/rare brief palpitations lasting just 1 or 2 seconds and resolving spontaneously.  She occasionally notes right-sided  chest discomfort that occurs when she coughs or sneezes and has noted this ever since her radiation.  She denies PND, orthopnea, dizziness, syncope, or early satiety.  Home Medications    Prior to Admission medications   Medication Sig Start Date End Date Taking? Authorizing Provider  aspirin 81 MG chewable tablet Chew 81 mg by mouth daily.   Yes [provider]  atorvastatin (LIPITOR) 80 MG tablet TAKE 1 TABLET BY MOUTH ONCE DAILY 11/13/18  Yes Bacigalupo, Dionne Bucy, MD  diclofenac sodium (VOLTAREN) 1 % GEL Apply 2 g topically 4 (four) times daily. 09/08/18  Yes Bacigalupo, Dionne Bucy, MD  ENTRESTO 24-26 MG TAKE 1 TABLET BY MOUTH TWICE DAILY 10/14/18  Yes Darylene Price A, FNP  fluticasone (FLONASE) 50 MCG/ACT nasal spray Place 2 sprays into both nostrils as needed for allergies or rhinitis.   Yes [provider]  furosemide (LASIX) 40 MG tablet Take 2 tablets (80 mg total) by mouth 2 (two) times daily. 02/26/19  Yes End, Harrell Gave, MD  gabapentin (NEURONTIN) 300 MG capsule Take 1 capsule (300 mg total) by mouth at bedtime. 03/08/19  Yes Mar Daring, PA-C  Lidocaine 5 % CREA Apply 1 application topically every 6 (six) hours as needed. 03/08/19  Yes Mar Daring, PA-C  metoprolol succinate (TOPROL-XL) 50 MG 24 hr tablet Take 1/2 tablet (25 mg) by mouth two times a day. Take with or immediately following a meal. 12/28/18  Yes Hackney, Otila Kluver A, FNP  nitroGLYCERIN (NITROSTAT) 0.3 MG SL tablet Place 1 tablet (0.3 mg total) under the tongue every 5 (five) minutes as needed for chest pain. 12/28/18  Yes Darylene Price A, FNP  spironolactone (ALDACTONE) 25 MG tablet Take 1 tablet by mouth once daily 11/13/18  Yes Bacigalupo, Dionne Bucy, MD  traMADol (ULTRAM) 50 MG tablet Take 1 tablet (50 mg total) every 6 (six) hours as needed by mouth. 07/08/17  Yes Bacigalupo, Dionne Bucy, MD  traZODone (DESYREL) 50 MG tablet Take 0.5-1 tablets (25-50 mg total) by mouth at bedtime as needed for sleep.  12/07/17  Yes Bacigalupo, Dionne Bucy, MD  warfarin (COUMADIN) 10 MG tablet Take 1 tablet (10 mg total) by mouth as directed. 11/13/18  Yes Bacigalupo, Dionne Bucy, MD  warfarin (COUMADIN) 7.5 MG tablet Take 1 tablet (7.5 mg total) by mouth daily. 12/28/18  Yes Alisa Graff, FNP    Review of Systems    Lower extremity edema as outlined above-now improved.  Occasional brief palpitations which have been stable.  Some degree of chronic dyspnea on exertion at higher levels of activity but overall doing well.  Right-sided pleuritic/musculoskeletal chest pain.  She denies PND, orthopnea, dizziness, syncope, or early satiety.  All other systems reviewed and are otherwise negative except as noted above.  Physical Exam    VS:  BP 134/72 (BP Location: Left Arm, Patient Position: Sitting, Cuff Size: Normal)   Pulse 81   Ht 5\' 2"  (1.575 m)   Wt 234 lb (106.1 kg)   LMP  (  LMP Unknown)   BMI 42.80 kg/m  , BMI Body mass index is 42.8 kg/m. GEN: Well nourished, well developed, in no acute distress. HEENT: normal. Neck: Supple, no JVD, carotid bruits, or masses. Cardiac: RRR, mechanical S1.  No murmurs, rubs, or gallops. No clubbing, cyanosis, trace bilateral ankle edema.  Radials/DP/PT 2+ and equal bilaterally.  Respiratory:  Respirations regular and unlabored, clear to auscultation bilaterally. GI: Soft, nontender, nondistended, BS + x 4. MS: no deformity or atrophy. Skin: warm and dry, no rash. Neuro:  Strength and sensation are intact. Psych: Normal affect.  Accessory Clinical Findings    ECG personally reviewed by me today -regular sinus rhythm, 81, left atrial enlargement, lateral Q waves- no acute changes.  Lab Results  Component Value Date   WBC 5.4 08/21/2018   HGB 12.5 08/21/2018   HCT 39.0 08/21/2018   MCV 86.9 08/21/2018   PLT 167 08/21/2018   Lab Results  Component Value Date   CREATININE 0.98 03/01/2019   BUN 15 03/01/2019   NA 140 03/01/2019   K 4.5 03/01/2019   CL 107  03/01/2019   CO2 25 03/01/2019   Lab Results  Component Value Date   ALT 16 08/18/2018   AST 20 08/18/2018   ALKPHOS 89 08/18/2018   BILITOT 0.6 08/18/2018   Lab Results  Component Value Date   CHOL 117 08/18/2018   HDL 40 08/18/2018   LDLCALC 58 08/18/2018   TRIG 96 08/18/2018   CHOLHDL 2.9 08/18/2018     Assessment & Plan    1.  Chronic systolic congestive heart failure/ischemic cardiomyopathy: EF 20 to 25%.  She recently contacted [place] in the setting of increasing lower extremity swelling.  She does not weigh herself at home.  We increased her furosemide to 80 mg twice daily and since then, swelling has improved.  She has trace lower extremity swelling today.  She does note that she sits a fair amount of the day and tries to keep her legs elevated.  She also owns compression socks but does not always wear.  She has been careful to avoid salt in her diet.  Recent basic metabolic panel showed stability of renal function and electrolytes.  She will remain on Lasix 80 mg twice daily in addition to Spironolactone, Entresto and beta-blocker therapy.  Pending stability of blood pressure at home, which has previously trended low, we can potentially look to titrate her Entresto at follow-up visit and bring her Lasix dose back down.  2.  Coronary artery disease: No symptoms of angina.  She remains on aspirin, statin, beta-blocker.  3.  Status post mechanical mitral valve replacement: On chronic Coumadin and aspirin.  4.  Palpitations: She continues to note fairly infrequent and very brief episodes of fluttering lasting 1 to 2 seconds and resolving spontaneously.  These are not particularly bothersome at this time.  She remains on beta-blocker therapy.  She is status post ICD with device follow-up and remote checks through our device clinic.  5.  Essential hypertension: Stable today.  6.  Hyperlipidemia: She remains on statin therapy.  Her LDL was 58 in December 2019.  7.  Disposition: She has  follow-up in heart failure clinic later this month.  We will arrange for follow-up here in approximately 2 months.   Murray Hodgkins, NP 03/09/2019, 11:49 AM

## 2019-03-16 ENCOUNTER — Inpatient Hospital Stay: Payer: Medicare HMO | Attending: Oncology | Admitting: Oncology

## 2019-03-16 ENCOUNTER — Encounter: Payer: Self-pay | Admitting: Physician Assistant

## 2019-03-16 ENCOUNTER — Encounter: Payer: Self-pay | Admitting: Oncology

## 2019-03-16 ENCOUNTER — Other Ambulatory Visit: Payer: Self-pay

## 2019-03-16 VITALS — BP 112/74 | Temp 97.6°F | Resp 18 | Wt 232.5 lb

## 2019-03-16 DIAGNOSIS — C3411 Malignant neoplasm of upper lobe, right bronchus or lung: Secondary | ICD-10-CM | POA: Diagnosis not present

## 2019-03-16 DIAGNOSIS — M545 Low back pain, unspecified: Secondary | ICD-10-CM

## 2019-03-16 DIAGNOSIS — Z08 Encounter for follow-up examination after completed treatment for malignant neoplasm: Secondary | ICD-10-CM

## 2019-03-16 DIAGNOSIS — Z85118 Personal history of other malignant neoplasm of bronchus and lung: Secondary | ICD-10-CM

## 2019-03-16 NOTE — Progress Notes (Signed)
Hematology/Oncology Consult note [facility]  [phone] [phone]  Patient Care Team: Virginia Crews, MD as PCP - General (Family Medicine) End, Harrell Gave, MD as PCP - Cardiology (Cardiology) Telford Nab, RN as Registered Nurse Nestor Lewandowsky, MD as Referring Physician (Cardiothoracic Surgery) Schuyler Amor, MD as Attending Physician (Emergency Medicine)   Name of the patient: Vanessa Carlson  [id number]  Nov 09, 1960   Date of visit: 03/16/19  Diagnosis-stage I lung cancer  Chief complaint/ Reason for visit-discuss CT scan results  Heme/Onc history: Patient is a 58 year old female with a past medical history significant for CAD, ischemic cardiomyopathy status post AICD placement, rheumatic heart disease status post mechanical mitral valve replacement on Coumadin.She was found to have a right upper lobe lung mass 2.5 cm that was hypermetabolic on PET CT scan in January 2019. No evidence of nodal involvement. She was not deemed to be a candidate for surgery and underwent SBRT to this lesion. This was biopsy-proven adenocarcinoma with acinar and solid morphologies. She then had a CT scan in January 2020 which showed enlarging satellite right upper lobe lung nodules. This was followed by a PET CT scan which showed right upper lobe lung nodules 2 of them were hypermetabolic with an SUV of 8.7 was also a thyroid nodule approximately 4 mm in size which was not definitely hypermetabolic. Patient was seen by Dr. Donella Stade following this and sh received SBRT for 5 fractions encompassing all these 3 nodules. Patient has not had any systemic chemotherapy for her lung cancer so far  Interval history-patient reports pain under her right arm as well as right chest wall since radiation and she has been on medications through her primary care doctor for this.  Denies other complaints at this time  ECOG PS- 1 Pain scale- 3 Opioid associated  constipation- no  Review of systems- Review of Systems  Constitutional: Negative for chills, fever, malaise/fatigue and weight loss.  HENT: Negative for congestion, ear discharge and nosebleeds.   Eyes: Negative for blurred vision.  Respiratory: Negative for cough, hemoptysis, sputum production, shortness of breath and wheezing.        Right lateral chest wall pain  Cardiovascular: Negative for chest pain, palpitations, orthopnea and claudication.  Gastrointestinal: Negative for abdominal pain, blood in stool, constipation, diarrhea, heartburn, melena, nausea and vomiting.  Genitourinary: Negative for dysuria, flank pain, frequency, hematuria and urgency.  Musculoskeletal: Negative for back pain, joint pain and myalgias.  Skin: Negative for rash.  Neurological: Negative for dizziness, tingling, focal weakness, seizures, weakness and headaches.  Endo/Heme/Allergies: Does not bruise/bleed easily.  Psychiatric/Behavioral: Negative for depression and suicidal ideas. The patient does not have insomnia.       No Known Allergies   Past Medical History:  Diagnosis Date   AICD (automatic cardioverter/defibrillator) present    Anterior myocardial infarction ([place])    a. 10/2014 - occluded LAD, complicated by cardiogenic shock-->Med Rx as interventional team was unable to open LAD.   Cancer of upper lobe of right lung ([place]) 09/2017   radiation therapy right side   Cervical cancer ([place])    in remission for ~20 yrs, s/p hysterectomy   Chronic combined systolic and diastolic CHF (congestive heart failure) ([place])    a. 10/2015 Echo: EF 20-25%, sev diast dysfxn, mildly reduced RV fxn. Nl MV prosthesis; b. 12/2017 Echo: EF 20-25%, diff HK. Sev ant/antsept HK, apical AK. Mild MS (mean graad 8mmHg). Mod TR. PASP 59mmHg.   Coronary artery disease  a. 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   Essential  hypertension    Ischemic cardiomyopathy    a. 03/2015 s/p MDT single lead AICD;  b. 10/2015 Echo: EF 20-25%;  c. 12/2017 Echo: EF 20-25%, diff HK.   Mass of upper lobe of right lung    a. noted on CXR & CT 08/2017 w/ abnl PET CT.   Mitral valve disease    a. 2006 s/p MVR w/ 73mm SJM bileaflet mechanical valve-->chronic coumadin/ASA; b. 12/2017 Echo: Mild MS. Mean gradient 67mmHg.   Personal history of radiation therapy    f/u lung ca     Past Surgical History:  Procedure Laterality Date   APPENDECTOMY  1998   BREAST EXCISIONAL BIOPSY Left 80s   benign   CARDIAC CATHETERIZATION     CARDIAC DEFIBRILLATOR PLACEMENT     RADICAL HYSTERECTOMY  1998   SHOULDER SURGERY Bilateral    rotator cuff tears   VALVE REPLACEMENT     Mitral valve; 27 mm St. Jude bileaflet valve    Social History   Socioeconomic History   Marital status: Married    Spouse name: Tosin   Number of children: 2   Years of education: 14   Highest education level: Some college, no degree  Occupational History    Employer: [employer] research officer, government strain: Not on file   Food insecurity    Worry: Not on file    Inability: Not on file   Transportation needs    Medical: Not on file    Non-medical: Not on file  Tobacco Use   Smoking status: Former Smoker    Packs/day: 1.00    Years: 4.00    Pack years: 4.00    Types: Cigarettes    Quit date: 1998    Years since quitting: 22.5   Smokeless tobacco: Never Used  Substance and Sexual Activity   Alcohol use: No   Drug use: No   Sexual activity: Yes    Partners: Male    Birth control/protection: Surgical  Lifestyle   Physical activity    Days per week: Not on file    Minutes per session: Not on file   Stress: Not on file  Relationships   Social connections    Talks on phone: Not on file    Gets together: Not on file    Attends religious service: Not on file    Active member of club or organization: Not  on file    Attends meetings of clubs or organizations: Not on file    Relationship status: Not on file   Intimate partner violence    Fear of current or ex partner: Not on file    Emotionally abused: Not on file    Physically abused: Not on file    Forced sexual activity: Not on file  Other Topics Concern   Not on file  Social History Narrative   Not on file    Family History  Problem Relation Age of Onset   Heart attack Mother    Hypertension Mother    Diabetes Mother    Emphysema Father        smoker   Diabetes Maternal Grandmother    Kidney disease Maternal Grandmother    Breast cancer Maternal Aunt    Breast cancer Maternal Aunt    Colon cancer Maternal Uncle      Current Outpatient  Medications:    aspirin 81 MG chewable tablet, Chew 81 mg by mouth daily., Disp: , Rfl:    atorvastatin (LIPITOR) 80 MG tablet, TAKE 1 TABLET BY MOUTH ONCE DAILY, Disp: 90 tablet, Rfl: 1   diclofenac sodium (VOLTAREN) 1 % GEL, Apply 2 g topically 4 (four) times daily., Disp: 100 g, Rfl: 2   ENTRESTO 24-26 MG, TAKE 1 TABLET BY MOUTH TWICE DAILY, Disp: 180 tablet, Rfl: 3   fluticasone (FLONASE) 50 MCG/ACT nasal spray, Place 2 sprays into both nostrils as needed for allergies or rhinitis., Disp: , Rfl:    furosemide (LASIX) 40 MG tablet, Take 2 tablets (80 mg total) by mouth 2 (two) times daily., Disp: 360 tablet, Rfl: 2   gabapentin (NEURONTIN) 300 MG capsule, Take 1 capsule (300 mg total) by mouth at bedtime., Disp: 90 capsule, Rfl: 3   metoprolol succinate (TOPROL-XL) 50 MG 24 hr tablet, Take 1/2 tablet (25 mg) by mouth two times a day. Take with or immediately following a meal., Disp: 90 tablet, Rfl: 3   nitroGLYCERIN (NITROSTAT) 0.3 MG SL tablet, Place 1 tablet (0.3 mg total) under the tongue every 5 (five) minutes as needed for chest pain., Disp: 30 tablet, Rfl: 3   spironolactone (ALDACTONE) 25 MG tablet, Take 1 tablet by mouth once daily, Disp: 90 tablet, Rfl: 1    traMADol (ULTRAM) 50 MG tablet, Take 1 tablet (50 mg total) every 6 (six) hours as needed by mouth., Disp: 30 tablet, Rfl: 5   traZODone (DESYREL) 50 MG tablet, Take 0.5-1 tablets (25-50 mg total) by mouth at bedtime as needed for sleep., Disp: 30 tablet, Rfl: 3   warfarin (COUMADIN) 10 MG tablet, Take 1 tablet (10 mg total) by mouth as directed., Disp: 30 tablet, Rfl: 3   warfarin (COUMADIN) 7.5 MG tablet, Take 1 tablet (7.5 mg total) by mouth daily., Disp: 90 tablet, Rfl: 3   Lidocaine 5 % CREA, Apply 1 application topically every 6 (six) hours as needed. (Patient not taking: Reported on 03/16/2019), Disp: 113 g, Rfl: 3  Physical exam:  Vitals:   03/16/19 1031  BP: 112/74  Resp: 18  Temp: 97.6 F (36.4 C)  Weight: 232 lb 8 oz (105.5 kg)   Physical Exam Constitutional:      General: She is not in acute distress.    Appearance: She is obese.  HENT:     Head: Normocephalic and atraumatic.  Eyes:     Pupils: Pupils are equal, round, and reactive to light.  Neck:     Musculoskeletal: Normal range of motion.  Cardiovascular:     Rate and Rhythm: Normal rate and regular rhythm.     Heart sounds: Normal heart sounds.  Pulmonary:     Effort: Pulmonary effort is normal. No respiratory distress.     Breath sounds: Normal breath sounds.  Abdominal:     General: Bowel sounds are normal.     Palpations: Abdomen is soft.  Skin:    General: Skin is warm and dry.  Neurological:     Mental Status: She is alert and oriented to person, place, and time.      CMP Latest Ref Rng & Units 03/01/2019  Glucose 70 - 99 mg/dL 135(H)  BUN 6 - 20 mg/dL 15  Creatinine 0.44 - 1.00 mg/dL 0.98  Sodium 135 - 145 mmol/L 140  Potassium 3.5 - 5.1 mmol/L 4.5  Chloride 98 - 111 mmol/L 107  CO2 22 - 32 mmol/L 25  Calcium 8.9 -  10.3 mg/dL 8.9  Total Protein 6.0 - 8.5 g/dL -  Total Bilirubin 0.0 - 1.2 mg/dL -  Alkaline Phos 39 - 117 IU/L -  AST 0 - 40 IU/L -  ALT 0 - 32 IU/L -   CBC Latest Ref Rng &  Units 08/21/2018  WBC 4.0 - 10.5 K/uL 5.4  Hemoglobin 12.0 - 15.0 g/dL 12.5  Hematocrit 36.0 - 46.0 % 39.0  Platelets 150 - 400 K/uL 167    No images are attached to the encounter.  Ct Chest W Contrast  Result Date: 03/08/2019 CLINICAL DATA:  Right upper lobe primary bronchogenic adenocarcinoma diagnosed January 2019 status post SBRT, with local recurrence. Restaging. EXAM: CT CHEST WITH CONTRAST TECHNIQUE: Multidetector CT imaging of the chest was performed during intravenous contrast administration. CONTRAST:  53mL OMNIPAQUE IOHEXOL 300 MG/ML  SOLN COMPARISON:  01/17/2019 chest CT. FINDINGS: Cardiovascular: Mild cardiomegaly. Single lead left subclavian ICD is noted with lead tip in the right ventricular apex. Mitral annular opacity ring in place. Three-vessel coronary atherosclerosis. No significant pericardial effusion/thickening. Atherosclerotic nonaneurysmal thoracic aorta. Normal caliber pulmonary arteries. No central pulmonary emboli. Mediastinum/Nodes: Stable hypodense 1.1 cm left thyroid lobe nodule. Unremarkable esophagus. No pathologically enlarged axillary, mediastinal or hilar lymph nodes. Lungs/Pleura: No pneumothorax. No pleural effusion. Mild centrilobular emphysema. Bandlike right upper lobe consolidation with associated increased volume loss and distortion, compatible with evolving radiation fibrosis. No acute consolidative airspace disease or lung masses. Peripheral left upper lobe 2 mm solid pulmonary nodule (series 3/image 34), stable since 09/19/2018 chest CT, probably benign. No new significant pulmonary nodules. Upper abdomen: Small hiatal hernia. Musculoskeletal: No aggressive appearing focal osseous lesions. Intact sternotomy wires. Mild thoracic spondylosis. IMPRESSION: 1. Evolving right upper lobe radiation fibrosis, with no findings of local tumor recurrence. 2. No findings of metastatic disease in the chest. Aortic Atherosclerosis (ICD10-I70.0) and Emphysema (ICD10-J43.9).  Electronically Signed   By: Ilona Sorrel M.D.   On: 03/08/2019 16:04     Assessment and plan- Patient is a 58 y.o. female with history of adenocarcinoma of the left upper lobe status post SBRT.  She is here to discuss the results of CT scan  I have reviewed CT chest images independently and discussed findings with the patient.  Patient was found to have new right upper lobe lung nodules based on CT scan in January 2019 and underwent SBRT for this.  Present scan show evolving radiation fibrosis in this area.  No new nodules are no evidence of metastatic disease.  I will plan to repeat CT chest abdomen pelvis in 6 months time and see her thereafter   Visit Diagnosis 1. Malignant neoplasm of upper lobe of right lung ([place])   2. Encounter for follow-up surveillance of lung cancer      Dr. Randa Evens, MD, MPH [facility] [phone] 03/16/2019 1:01 PM

## 2019-03-16 NOTE — Progress Notes (Signed)
Patient is here for follow up, she is doing well. She has lots of pain in her right shoulder and back and axially, constant burning, stabbing and aching.

## 2019-03-19 ENCOUNTER — Ambulatory Visit (INDEPENDENT_AMBULATORY_CARE_PROVIDER_SITE_OTHER): Payer: Medicare HMO

## 2019-03-19 DIAGNOSIS — Z95810 Presence of automatic (implantable) cardiac defibrillator: Secondary | ICD-10-CM | POA: Diagnosis not present

## 2019-03-19 DIAGNOSIS — I5022 Chronic systolic (congestive) heart failure: Secondary | ICD-10-CM | POA: Diagnosis not present

## 2019-03-19 MED ORDER — TRAMADOL HCL 50 MG PO TABS: 50.0000 mg | ORAL_TABLET | Freq: Four times a day (QID) | ORAL | 5 refills | Status: DC | PRN

## 2019-03-20 NOTE — Progress Notes (Signed)
EPIC Encounter for ICM Monitoring  Patient Name: Vanessa Carlson is a 58 y.o. female Date: 03/20/2019 Primary Care Physican: Virginia Crews, MD Primary Cardiologist: Carlis Abbott, NP HF Electrophysiologist: Suzzanne Cloud [id number] 03/20/2019 Weight: unknown       Attempted call to patient and unable to reach.  Left detailed message per DPR regarding transmission. Transmission reviewed.   Optivol thoracic impedance suggesting possible fluid accumulation since 03/12/2019 and is close to normal baseline today. Impedance also decreased from 6/28 - 7/1 suggesting fluid accumulation.   Prescribed:Furosemide 40 mg 2 tablets (80 mg total) twice a day.   Labs: 03/01/2019 Creatinine 0.98, BUN 15, Potassium 4.5, Sodium 140, GFR >60 A complete set of results can be found in Results Review.  Recommendations:Left voice mail with ICM number and encouraged to call if experiencing any fluid symptoms.  Follow-up plan: ICM clinic phone appointment on8/24/2020.  Copy of ICM check sent to [place].  3 month ICM trend: 03/19/2019    1 Year ICM trend:       [name], RN 03/20/2019 9:56 AM

## 2019-03-21 ENCOUNTER — Encounter: Payer: Medicare HMO | Admitting: Family Medicine

## 2019-03-21 ENCOUNTER — Telehealth: Payer: Self-pay

## 2019-03-21 ENCOUNTER — Ambulatory Visit: Payer: Medicare HMO

## 2019-03-21 NOTE — Telephone Encounter (Signed)
Remote ICM transmission received.  Attempted call to patient regarding ICM remote transmission and left detailed message, per DPR, with next ICM remote transmission date of 04/23/2019.  Advised to return call for any fluid symptoms or questions.

## 2019-03-22 ENCOUNTER — Ambulatory Visit: Payer: Medicare HMO | Admitting: Radiation Oncology

## 2019-03-26 ENCOUNTER — Ambulatory Visit (INDEPENDENT_AMBULATORY_CARE_PROVIDER_SITE_OTHER): Payer: Medicare HMO | Admitting: *Deleted

## 2019-03-26 DIAGNOSIS — I255 Ischemic cardiomyopathy: Secondary | ICD-10-CM

## 2019-03-26 DIAGNOSIS — I480 Paroxysmal atrial fibrillation: Secondary | ICD-10-CM

## 2019-03-26 LAB — CUP PACEART REMOTE DEVICE CHECK
Battery Remaining Longevity: 105 mo
Battery Voltage: 3.01 V
Brady Statistic RV Percent Paced: 0.01 %
Date Time Interrogation Session: 20200727062603
HighPow Impedance: 80 Ohm
Implantable Lead Implant Date: 20160802
Implantable Lead Location: 753860
Implantable Pulse Generator Implant Date: 20160802
Lead Channel Impedance Value: 608 Ohm
Lead Channel Impedance Value: 703 Ohm
Lead Channel Pacing Threshold Amplitude: 0.625 V
Lead Channel Pacing Threshold Pulse Width: 0.4 ms
Lead Channel Sensing Intrinsic Amplitude: 13.875 mV
Lead Channel Sensing Intrinsic Amplitude: 13.875 mV
Lead Channel Setting Pacing Amplitude: 2 V
Lead Channel Setting Pacing Pulse Width: 0.4 ms
Lead Channel Setting Sensing Sensitivity: 0.3 mV

## 2019-03-29 ENCOUNTER — Ambulatory Visit: Payer: Medicare HMO | Admitting: Family

## 2019-04-04 ENCOUNTER — Other Ambulatory Visit: Payer: Self-pay

## 2019-04-04 ENCOUNTER — Ambulatory Visit (INDEPENDENT_AMBULATORY_CARE_PROVIDER_SITE_OTHER): Payer: Medicare HMO

## 2019-04-04 DIAGNOSIS — Z952 Presence of prosthetic heart valve: Secondary | ICD-10-CM | POA: Diagnosis not present

## 2019-04-04 LAB — POCT INR
INR: 3.7 — AB (ref 2.0–3.0)
PT: 44.3

## 2019-04-04 NOTE — Patient Instructions (Signed)
Description   Dx:  Mechanical heart valve Current Coumadin dose:  7.5mg  every day except 5 mg on Wednesdays  PT:  44.3 INR: 3.7 Recheck: 2 weeks

## 2019-04-09 NOTE — Progress Notes (Signed)
Remote ICD transmission.   

## 2019-04-10 ENCOUNTER — Encounter: Payer: Self-pay | Admitting: Physician Assistant

## 2019-04-17 ENCOUNTER — Other Ambulatory Visit: Payer: Self-pay | Admitting: Family Medicine

## 2019-04-17 NOTE — Telephone Encounter (Signed)
Pt needing a refill on the gabapentin (NEURONTIN) 300 MG capsule.  She currently is out. She states she was taking the gabapentin with the traMADol (ULTRAM) 50 MG tablet.   She says they worked great together for her.  She is set with the tramadol and doesn't need a refill on it currently, only the gabapentin.  Please fill at:  [facility] (N), [place] - [address] [phone] (Phone) [phone] (Fax)   Thanks, [employer]

## 2019-04-18 ENCOUNTER — Ambulatory Visit (INDEPENDENT_AMBULATORY_CARE_PROVIDER_SITE_OTHER): Payer: Medicare HMO

## 2019-04-18 ENCOUNTER — Other Ambulatory Visit: Payer: Self-pay

## 2019-04-18 ENCOUNTER — Encounter: Payer: Self-pay | Admitting: Physician Assistant

## 2019-04-18 DIAGNOSIS — Z952 Presence of prosthetic heart valve: Secondary | ICD-10-CM | POA: Diagnosis not present

## 2019-04-18 DIAGNOSIS — G6282 Radiation-induced polyneuropathy: Secondary | ICD-10-CM

## 2019-04-18 LAB — POCT INR
INR: 2.5 (ref 2.0–3.0)
PT: 29.8

## 2019-04-18 MED ORDER — GABAPENTIN 300 MG PO CAPS: 300.0000 mg | ORAL_CAPSULE | Freq: Two times a day (BID) | ORAL | 3 refills | Status: DC

## 2019-04-18 NOTE — Telephone Encounter (Signed)
Patient was advised via voicemail that she already have refills on the Gabapentin at the pharmacy.

## 2019-04-18 NOTE — Patient Instructions (Signed)
Description   7.5 mg q d except 5 mg W

## 2019-04-23 ENCOUNTER — Ambulatory Visit (INDEPENDENT_AMBULATORY_CARE_PROVIDER_SITE_OTHER): Payer: Medicare HMO

## 2019-04-23 DIAGNOSIS — I5022 Chronic systolic (congestive) heart failure: Secondary | ICD-10-CM

## 2019-04-23 DIAGNOSIS — Z95810 Presence of automatic (implantable) cardiac defibrillator: Secondary | ICD-10-CM

## 2019-04-24 NOTE — Progress Notes (Signed)
EPIC Encounter for ICM Monitoring  Patient Name: Vanessa Carlson is a 58 y.o. female Date: 04/24/2019 Primary Care Physican: Virginia Crews, MD Primary Cardiologist: Carlis Abbott, NP HF Electrophysiologist: Caryl Comes Weight: unknown   Transmission reviewed.   Optivol thoracic impedance normal.   Prescribed:Furosemide 40 mg 2 tablets (80 mg total) twice a day.   Labs: 03/01/2019 Creatinine 0.98, BUN 15, Potassium 4.5, Sodium 140, GFR >60 A complete set of results can be found in Results Review.  Recommendations:None  Follow-up plan: ICM clinic phone appointment on10/28/2020.  Copy of ICM check sent to [place].  3 month ICM trend: 04/23/2019    1 Year ICM trend:       [name], RN 04/24/2019 8:41 AM

## 2019-05-02 ENCOUNTER — Ambulatory Visit: Payer: Self-pay

## 2019-05-09 ENCOUNTER — Other Ambulatory Visit: Payer: Self-pay

## 2019-05-09 ENCOUNTER — Ambulatory Visit (INDEPENDENT_AMBULATORY_CARE_PROVIDER_SITE_OTHER): Payer: Medicare HMO

## 2019-05-09 DIAGNOSIS — Z952 Presence of prosthetic heart valve: Secondary | ICD-10-CM | POA: Diagnosis not present

## 2019-05-09 LAB — POCT INR
INR: 1.3 — AB (ref 2.0–3.0)
PT: 15.6

## 2019-05-09 NOTE — Patient Instructions (Signed)
Description   7.5 mg q d, reduce greens in diet and f/u in 2 weeks

## 2019-05-17 ENCOUNTER — Other Ambulatory Visit: Payer: Self-pay | Admitting: Family Medicine

## 2019-05-23 ENCOUNTER — Ambulatory Visit (INDEPENDENT_AMBULATORY_CARE_PROVIDER_SITE_OTHER): Payer: Medicare HMO

## 2019-05-23 ENCOUNTER — Other Ambulatory Visit: Payer: Self-pay

## 2019-05-23 DIAGNOSIS — Z952 Presence of prosthetic heart valve: Secondary | ICD-10-CM | POA: Diagnosis not present

## 2019-05-23 LAB — POCT INR
INR: 3 (ref 2.0–3.0)
PT: 36.1

## 2019-05-23 NOTE — Patient Instructions (Signed)
Description   7.5 mg q d f/u in 2 weeks

## 2019-05-25 ENCOUNTER — Other Ambulatory Visit: Payer: Self-pay | Admitting: Family Medicine

## 2019-06-06 ENCOUNTER — Ambulatory Visit: Payer: Self-pay

## 2019-06-12 ENCOUNTER — Other Ambulatory Visit: Payer: Self-pay

## 2019-06-12 ENCOUNTER — Ambulatory Visit (INDEPENDENT_AMBULATORY_CARE_PROVIDER_SITE_OTHER): Payer: Medicare Other

## 2019-06-12 DIAGNOSIS — Z952 Presence of prosthetic heart valve: Secondary | ICD-10-CM | POA: Diagnosis not present

## 2019-06-12 LAB — POCT INR
INR: 3.3 — AB (ref 2.0–3.0)
PT: 39.8

## 2019-06-12 NOTE — Patient Instructions (Signed)
Continue 7.5mg  a day.  Recheck in four weeks.

## 2019-06-14 ENCOUNTER — Other Ambulatory Visit: Payer: Self-pay | Admitting: Family Medicine

## 2019-06-14 DIAGNOSIS — Z1231 Encounter for screening mammogram for malignant neoplasm of breast: Secondary | ICD-10-CM

## 2019-06-21 ENCOUNTER — Ambulatory Visit: Payer: Medicare Other | Attending: Radiation Oncology | Admitting: Radiation Oncology

## 2019-06-25 ENCOUNTER — Encounter: Payer: Medicare HMO | Admitting: Family Medicine

## 2019-06-26 ENCOUNTER — Ambulatory Visit (INDEPENDENT_AMBULATORY_CARE_PROVIDER_SITE_OTHER): Payer: Medicare Other | Admitting: *Deleted

## 2019-06-26 DIAGNOSIS — I5022 Chronic systolic (congestive) heart failure: Secondary | ICD-10-CM

## 2019-06-26 DIAGNOSIS — I480 Paroxysmal atrial fibrillation: Secondary | ICD-10-CM | POA: Diagnosis not present

## 2019-06-26 LAB — CUP PACEART REMOTE DEVICE CHECK
Battery Remaining Longevity: 102 mo
Battery Voltage: 3.01 V
Brady Statistic RV Percent Paced: 0.01 %
Date Time Interrogation Session: 20201027063324
HighPow Impedance: 87 Ohm
Implantable Lead Implant Date: 20160802
Implantable Lead Location: 753860
Implantable Pulse Generator Implant Date: 20160802
Lead Channel Impedance Value: 570 Ohm
Lead Channel Impedance Value: 665 Ohm
Lead Channel Pacing Threshold Amplitude: 0.625 V
Lead Channel Pacing Threshold Pulse Width: 0.4 ms
Lead Channel Sensing Intrinsic Amplitude: 13.625 mV
Lead Channel Sensing Intrinsic Amplitude: 13.625 mV
Lead Channel Setting Pacing Amplitude: 2 V
Lead Channel Setting Pacing Pulse Width: 0.4 ms
Lead Channel Setting Sensing Sensitivity: 0.3 mV

## 2019-06-27 ENCOUNTER — Ambulatory Visit (INDEPENDENT_AMBULATORY_CARE_PROVIDER_SITE_OTHER): Payer: Medicare Other

## 2019-06-27 DIAGNOSIS — I5022 Chronic systolic (congestive) heart failure: Secondary | ICD-10-CM | POA: Diagnosis not present

## 2019-06-27 DIAGNOSIS — Z95810 Presence of automatic (implantable) cardiac defibrillator: Secondary | ICD-10-CM

## 2019-06-29 NOTE — Progress Notes (Signed)
EPIC Encounter for ICM Monitoring  Patient Name: Vanessa Carlson is a 58 y.o. female Date: 06/29/2019 Primary Care Physican: Virginia Crews, MD Primary Cardiologist: Carlis Abbott, NP HF Electrophysiologist: Caryl Comes Weight:unknown   Transmission reviewed.   Optivol thoracic impedancenormal.  Prescribed:Furosemide40 mg2tablets (80 mg total) twice a day.   Labs: 03/01/2019 Creatinine0.98, BUN15, Potassium4.5, Sodium140, GFR>60 A complete set of results can be found in Results Review.  Recommendations:None  Follow-up plan: ICM clinic phone appointment on 07/30/2019.   91 day device clinic remote transmission 09/25/2019.      Copy of ICM check sent to Dr. Caryl Comes.   3 month ICM trend: 06/26/2019    1 Year ICM trend:       [name], RN 06/29/2019 3:30 PM

## 2019-07-04 ENCOUNTER — Other Ambulatory Visit: Payer: Self-pay | Admitting: Family Medicine

## 2019-07-10 ENCOUNTER — Other Ambulatory Visit: Payer: Self-pay

## 2019-07-10 ENCOUNTER — Ambulatory Visit (INDEPENDENT_AMBULATORY_CARE_PROVIDER_SITE_OTHER): Payer: Medicare Other

## 2019-07-10 DIAGNOSIS — Z952 Presence of prosthetic heart valve: Secondary | ICD-10-CM

## 2019-07-10 LAB — POCT INR
INR: 2.4 (ref 2.0–3.0)
Prothrombin Time: 28.7

## 2019-07-10 NOTE — Patient Instructions (Signed)
Description   Change: Today take one and a half tablet per Dr. Brita Romp then continue back at 7.5mg  the following day on.

## 2019-07-11 ENCOUNTER — Ambulatory Visit: Payer: Self-pay

## 2019-07-16 NOTE — Progress Notes (Signed)
Remote ICD transmission.   

## 2019-07-24 ENCOUNTER — Ambulatory Visit: Payer: Medicare Other

## 2019-07-28 ENCOUNTER — Encounter: Payer: Self-pay | Admitting: Family Medicine

## 2019-07-30 ENCOUNTER — Telehealth: Payer: Self-pay

## 2019-07-30 ENCOUNTER — Ambulatory Visit (INDEPENDENT_AMBULATORY_CARE_PROVIDER_SITE_OTHER): Payer: Medicare Other

## 2019-07-30 DIAGNOSIS — I5022 Chronic systolic (congestive) heart failure: Secondary | ICD-10-CM

## 2019-07-30 DIAGNOSIS — Z95810 Presence of automatic (implantable) cardiac defibrillator: Secondary | ICD-10-CM

## 2019-07-30 NOTE — Telephone Encounter (Signed)
Remote ICM transmission received.  Attempted call to patient regarding ICM remote transmission and left detailed message per DPR.  Advised to return call for any fluid symptoms or questions. Next ICM remote transmission scheduled 09/03/2019.     

## 2019-07-30 NOTE — Progress Notes (Signed)
EPIC Encounter for ICM Monitoring  Patient Name: Vanessa Carlson is a 58 y.o. female Date: 07/30/2019 Primary Care Physican: Virginia Crews, MD Primary Cardiologist: Carlis Abbott, NP HF Electrophysiologist: Caryl Comes Weight:unknown   Attempted call to patient and unable to reach.  Left detailed message per DPR regarding transmission. Transmission reviewed.   Optivol thoracic impedancenormal.  Prescribed:Furosemide40 mg2tablets (80 mg total) twice a day.   Labs: 03/01/2019 Creatinine0.98, BUN15, Potassium4.5, Sodium140, GFR>60 A complete set of results can be found in Results Review.  Recommendations: Left voice mail with ICM number and encouraged to call if experiencing any fluid symptoms.  Follow-up plan: ICM clinic phone appointment on 09/03/2019.   Office appt 08/09/2019 with Dr. Saunders Revel.    Copy of ICM check sent to Dr. Caryl Comes.   3 month ICM trend: 07/30/2019    1 Year ICM trend:       [name], RN 07/30/2019 4:03 PM

## 2019-08-06 ENCOUNTER — Encounter: Payer: Self-pay | Admitting: Family Medicine

## 2019-08-08 MED ORDER — CYCLOBENZAPRINE HCL 5 MG PO TABS: 5.0000 mg | ORAL_TABLET | Freq: Three times a day (TID) | ORAL | 1 refills | Status: DC | PRN

## 2019-08-09 ENCOUNTER — Ambulatory Visit: Payer: Medicare Other | Admitting: Internal Medicine

## 2019-08-11 ENCOUNTER — Other Ambulatory Visit: Payer: Self-pay | Admitting: Family Medicine

## 2019-08-15 ENCOUNTER — Ambulatory Visit: Payer: Medicare Other | Admitting: Internal Medicine

## 2019-09-03 ENCOUNTER — Ambulatory Visit (INDEPENDENT_AMBULATORY_CARE_PROVIDER_SITE_OTHER): Payer: Medicare Other

## 2019-09-03 ENCOUNTER — Encounter: Payer: Medicare Other | Admitting: Family Medicine

## 2019-09-03 DIAGNOSIS — I5022 Chronic systolic (congestive) heart failure: Secondary | ICD-10-CM | POA: Diagnosis not present

## 2019-09-03 DIAGNOSIS — Z95810 Presence of automatic (implantable) cardiac defibrillator: Secondary | ICD-10-CM

## 2019-09-07 NOTE — Progress Notes (Signed)
EPIC Encounter for ICM Monitoring  Patient Name: Vanessa Carlson is a 59 y.o. female Date: 09/07/2019 Primary Care Physican: Virginia Crews, MD Primary Cardiologist: Carlis Abbott, NP HF Electrophysiologist: Caryl Comes Weight:unknown   Transmission reviewed.   Optivol thoracic impedancenormal.  Prescribed:Furosemide40 mg2tablets (80 mg total) twice a day.   Labs: 03/01/2019 Creatinine0.98, BUN15, Potassium4.5, Sodium140, GFR>60 A complete set of results can be found in Results Review.  Recommendations: None  Follow-up plan: ICM clinic phone appointment on 10/08/2019.   91 day device clinic remote transmission 09/25/2019.     Copy of ICM check sent to Dr. Caryl Comes.   3 month ICM trend: 09/03/2019    1 Year ICM trend:       [name], RN 09/07/2019 11:09 AM

## 2019-09-08 ENCOUNTER — Other Ambulatory Visit: Payer: Self-pay | Admitting: Family Medicine

## 2019-09-08 NOTE — Telephone Encounter (Signed)
Requested medication (s) are due for refill today:   Yes  Requested medication (s) are on the active medication list:   Yes  Future visit scheduled:   No   Last ordered: 05/25/2019  #90  0 refills    Failed protocol due to labs due.   Requested Prescriptions  Pending Prescriptions Disp Refills   atorvastatin (LIPITOR) 80 MG tablet [Pharmacy Med Name: Atorvastatin Calcium 80 MG Oral Tablet] 90 tablet 0    Sig: Take 1 tablet by mouth once daily      Cardiovascular:  Antilipid - Statins Failed - 09/08/2019  9:51 PM      Failed - Total Cholesterol in normal range and within 360 days    Cholesterol, Total  Date Value Ref Range Status  08/18/2018 117 100 - 199 mg/dL Final          Failed - LDL in normal range and within 360 days    LDL Cholesterol (Calc)  Date Value Ref Range Status  07/08/2017 66 mg/dL (calc) Final    Comment:    Reference range: <100 . Desirable range <100 mg/dL for primary prevention;   <70 mg/dL for patients with CHD or diabetic patients  with > or = 2 CHD risk factors. [name] LDL-C is now calculated using the Martin-Hopkins  calculation, which is a validated novel method providing  better accuracy than the Friedewald equation in the  estimation of LDL-C.  Cresenciano Genre et al. Annamaria Helling. 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

## 2019-09-12 ENCOUNTER — Telehealth: Payer: Self-pay

## 2019-09-12 ENCOUNTER — Ambulatory Visit
Admission: RE | Admit: 2019-09-12 | Discharge: 2019-09-12 | Disposition: A | Discharge status: Home / Self Care | Payer: Medicare Other | Source: Ambulatory Visit | Attending: Family Medicine | Admitting: Family Medicine

## 2019-09-12 DIAGNOSIS — Z1231 Encounter for screening mammogram for malignant neoplasm of breast: Secondary | ICD-10-CM | POA: Diagnosis not present

## 2019-09-12 NOTE — Telephone Encounter (Signed)
-----   Message from Virginia Crews, MD sent at 09/12/2019  1:21 PM EST ----- Normal mammogram. Repeat in 1 yr

## 2019-09-12 NOTE — Telephone Encounter (Signed)
Patient advised as below.  

## 2019-09-17 ENCOUNTER — Encounter: Payer: Self-pay | Admitting: Physician Assistant

## 2019-09-17 ENCOUNTER — Other Ambulatory Visit: Payer: Self-pay

## 2019-09-17 ENCOUNTER — Inpatient Hospital Stay: Payer: Medicare Other | Attending: Oncology

## 2019-09-17 ENCOUNTER — Ambulatory Visit
Admission: RE | Admit: 2019-09-17 | Discharge: 2019-09-17 | Disposition: A | Discharge status: Home / Self Care | Payer: Medicare Other | Source: Ambulatory Visit | Attending: Oncology | Admitting: Oncology

## 2019-09-17 DIAGNOSIS — Z8541 Personal history of malignant neoplasm of cervix uteri: Secondary | ICD-10-CM | POA: Insufficient documentation

## 2019-09-17 DIAGNOSIS — I252 Old myocardial infarction: Secondary | ICD-10-CM | POA: Diagnosis not present

## 2019-09-17 DIAGNOSIS — Z87891 Personal history of nicotine dependence: Secondary | ICD-10-CM | POA: Insufficient documentation

## 2019-09-17 DIAGNOSIS — Z95810 Presence of automatic (implantable) cardiac defibrillator: Secondary | ICD-10-CM | POA: Diagnosis not present

## 2019-09-17 DIAGNOSIS — Z85118 Personal history of other malignant neoplasm of bronchus and lung: Secondary | ICD-10-CM | POA: Insufficient documentation

## 2019-09-17 DIAGNOSIS — Z79899 Other long term (current) drug therapy: Secondary | ICD-10-CM | POA: Diagnosis not present

## 2019-09-17 DIAGNOSIS — I5042 Chronic combined systolic (congestive) and diastolic (congestive) heart failure: Secondary | ICD-10-CM | POA: Diagnosis not present

## 2019-09-17 DIAGNOSIS — Z7982 Long term (current) use of aspirin: Secondary | ICD-10-CM | POA: Insufficient documentation

## 2019-09-17 DIAGNOSIS — I110 Hypertensive heart disease with heart failure: Secondary | ICD-10-CM | POA: Insufficient documentation

## 2019-09-17 DIAGNOSIS — I2510 Atherosclerotic heart disease of native coronary artery without angina pectoris: Secondary | ICD-10-CM | POA: Insufficient documentation

## 2019-09-17 DIAGNOSIS — Z791 Long term (current) use of non-steroidal anti-inflammatories (NSAID): Secondary | ICD-10-CM | POA: Diagnosis not present

## 2019-09-17 DIAGNOSIS — C3411 Malignant neoplasm of upper lobe, right bronchus or lung: Secondary | ICD-10-CM | POA: Insufficient documentation

## 2019-09-17 DIAGNOSIS — Z952 Presence of prosthetic heart valve: Secondary | ICD-10-CM | POA: Diagnosis not present

## 2019-09-17 DIAGNOSIS — I255 Ischemic cardiomyopathy: Secondary | ICD-10-CM | POA: Diagnosis not present

## 2019-09-17 DIAGNOSIS — M545 Low back pain, unspecified: Secondary | ICD-10-CM

## 2019-09-17 DIAGNOSIS — I059 Rheumatic mitral valve disease, unspecified: Secondary | ICD-10-CM | POA: Diagnosis not present

## 2019-09-17 DIAGNOSIS — Z7901 Long term (current) use of anticoagulants: Secondary | ICD-10-CM | POA: Diagnosis not present

## 2019-09-17 LAB — COMPREHENSIVE METABOLIC PANEL
ALT: 15 U/L (ref 0–44)
AST: 21 U/L (ref 15–41)
Albumin: 4.2 g/dL (ref 3.5–5.0)
Alkaline Phosphatase: 70 U/L (ref 38–126)
Anion gap: 8 (ref 5–15)
BUN: 13 mg/dL (ref 6–20)
CO2: 28 mmol/L (ref 22–32)
Calcium: 9.1 mg/dL (ref 8.9–10.3)
Chloride: 104 mmol/L (ref 98–111)
Creatinine, Ser: 0.89 mg/dL (ref 0.44–1.00)
GFR calc Af Amer: >60 mL/min (ref >60)
GFR calc non Af Amer: >60 mL/min (ref >60)
Glucose, Bld: 145 mg/dL — ABNORMAL HIGH (ref 70–99)
Potassium: 3.6 mmol/L (ref 3.5–5.1)
Sodium: 140 mmol/L (ref 135–145)
Total Bilirubin: 1.1 mg/dL (ref 0.3–1.2)
Total Protein: 7.9 g/dL (ref 6.5–8.1)

## 2019-09-17 LAB — CBC WITH DIFFERENTIAL/PLATELET
Abs Immature Granulocytes: 0.01 10*3/uL (ref 0.00–0.07)
Basophils Absolute: 0 10*3/uL (ref 0.0–0.1)
Basophils Relative: 1 %
Eosinophils Absolute: 0.1 10*3/uL (ref 0.0–0.5)
Eosinophils Relative: 2 %
HCT: 41 % (ref 36.0–46.0)
Hemoglobin: 12.8 g/dL (ref 12.0–15.0)
Immature Granulocytes: 0 %
Lymphocytes Relative: 30 %
Lymphs Abs: 1.4 10*3/uL (ref 0.7–4.0)
MCH: 28.2 pg (ref 26.0–34.0)
MCHC: 31.2 g/dL (ref 30.0–36.0)
MCV: 90.3 fL (ref 80.0–100.0)
Monocytes Absolute: 0.4 10*3/uL (ref 0.1–1.0)
Monocytes Relative: 9 %
Neutro Abs: 2.7 10*3/uL (ref 1.7–7.7)
Neutrophils Relative %: 58 %
Platelets: 168 10*3/uL (ref 150–400)
RBC: 4.54 MIL/uL (ref 3.87–5.11)
RDW: 14.6 % (ref 11.5–15.5)
WBC: 4.6 10*3/uL (ref 4.0–10.5)
nRBC: 0 % (ref 0.0–0.2)

## 2019-09-17 MED ORDER — IOHEXOL 300 MG/ML  SOLN: 100.0000 mL | Freq: Once | INTRAMUSCULAR | Status: DC | PRN

## 2019-09-17 MED ORDER — TRAMADOL HCL 50 MG PO TABS: 50.0000 mg | ORAL_TABLET | Freq: Four times a day (QID) | ORAL | 5 refills | Status: DC | PRN

## 2019-09-17 MED ORDER — IOHEXOL 300 MG/ML  SOLN
100.0000 mL | Freq: Once | INTRAMUSCULAR | Status: AC | PRN
  Administered 2019-09-17: 100 mL via INTRAVENOUS

## 2019-09-17 NOTE — Telephone Encounter (Signed)
Patient send a MyChart message requesting a refill on her Tramadol 50 MG. L.O.V. was on 03/08/2019,please advise.

## 2019-09-17 NOTE — Progress Notes (Signed)
Follow-up Outpatient Visit Date: 09/19/2019  Primary Care Provider: Virginia Crews, MD [address] [place] [address]  Chief Complaint: Follow-up coronary artery disease, valvular heart disease, and follow-up chronic systolic heart failure  HPI:  Ms. Hebner is a 59 y.o. 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  She was last seen in our office by Ignacia Bayley, NP in 02/2019.  She had developed worsening leg edema in late June, which prompted [place] to recommend increasing furosemide to 80 mg BID (from 40 mg TID).  Edema resolved within a week.  She was doing well at her last visit with Ignacia Bayley, NP in July.  She noted rare palpitations lasting 1-2 seconds as well as right-sided chest pain with sneezing/coughing that had been present every since her lung cancer XRT.  No medication changes were made at that time.  Today, the patient reports that she has been feeling relatively well.  She still has random pains in her right upper chest, which seem no worse than at our prior visit.  The pain is not exertional.  She denies shortness of breath and lightheadedness.  She has stable lower extremity edema that has been well controlled with furosemide 80 mg twice daily.  She notes occasional brief palpitations without lightheadedness or other other associated symptoms.  She has not had any bleeding.  She is trying to exercise regularly, though she sprained her knee about 3 weeks  ago.  --------------------------------------------------------------------------------------------------  Cardiovascular History & Procedures: Cardiovascular Problems:  Coronary artery disease status post anterior STEMI (2016)  Ischemic cardiomyopathy with chronic systolic heart failure  Rheumatic heart disease status post mechanical mitral valve replacement (2006)  Risk Factors:  Known coronary artery disease and hypertension  Cath/PCI:  LHC (2016, [place]): Reportedly proximal occlusion of the LAD, which could not be crossed with a wire.  CV Surgery:  Mitral valve replacement (2006, [facility]): 27 mm St. Jude bileaflet valve  EP Procedures and Devices:  ICD (04/01/15, [facility]): Medtronic single-chamber ICD  Non-Invasive Evaluation(s):  TTE (01/05/18): Mildly dilated LV. LVEF 20 to 25% with diffuse hypokinesis and severe hypokinesis of the anterior and anteroseptal myocardium. Apical akinesis. Mechanical mitral valve with mean gradient of 4 mmHg. Mild left atrial enlargement. Normal RV function. Moderate TR. PASP 43 mmHg.  TTE (11/20/15): Normal LV size and wall thickness. LVEF 20-25% with severe diastolic dysfunction. Mildly reduced RV contraction. Normal MV prosthesis function.  Recent CV Pertinent Labs: Lab Results  Component Value Date   CHOL 117 08/18/2018   HDL 40 08/18/2018   LDLCALC 58 08/18/2018   LDLCALC 66 07/08/2017   TRIG 96 08/18/2018   CHOLHDL 2.9 08/18/2018   CHOLHDL 2.8 07/08/2017   INR 2.4 07/10/2019   INR 1.10 09/23/2017   K 3.6 09/17/2019   BUN 13 09/17/2019   BUN 10 08/18/2018   CREATININE 0.89 09/17/2019   CREATININE 1.14 (H) 07/08/2017    Past medical and surgical history were reviewed and updated in EPIC.  Current Meds  Medication Sig  . aspirin 81 MG chewable tablet Chew 81 mg by mouth daily.  [name] atorvastatin (LIPITOR) 80 MG tablet Take 1 tablet  by mouth once daily  . cyclobenzaprine (FLEXERIL) 5 MG tablet Take 1 tablet (5 mg total)  by mouth 3 (three) times daily as needed for muscle spasms.  . diclofenac sodium (VOLTAREN) 1 % GEL APPLY 2 GRAMS TOPICALLY 4 TIMES DAILY  . ENTRESTO 24-26 MG TAKE 1 TABLET BY MOUTH TWICE DAILY  . fluticasone (FLONASE) 50 MCG/ACT nasal spray Place 2 sprays into both nostrils as needed for allergies or rhinitis.  . furosemide (LASIX) 40 MG tablet Take 2 tablets (80 mg total) by mouth 2 (two) times daily.  [name] gabapentin (NEURONTIN) 300 MG capsule Take 1 capsule (300 mg total) by mouth 2 (two) times daily.  . Lidocaine 5 % CREA Apply 1 application topically every 6 (six) hours as needed.  . metoprolol succinate (TOPROL-XL) 50 MG 24 hr tablet Take 1/2 tablet (25 mg) by mouth two times a day. Take with or immediately following a meal.  . nitroGLYCERIN (NITROSTAT) 0.3 MG SL tablet Place 1 tablet (0.3 mg total) under the tongue every 5 (five) minutes as needed for chest pain.  [name] spironolactone (ALDACTONE) 25 MG tablet Take 1 tablet by mouth once daily  . traMADol (ULTRAM) 50 MG tablet Take 1 tablet (50 mg total) by mouth every 6 (six) hours as needed.  . traZODone (DESYREL) 50 MG tablet Take 0.5-1 tablets (25-50 mg total) by mouth at bedtime as needed for sleep.  [name] warfarin (COUMADIN) 10 MG tablet Take 1 tablet (10 mg total) by mouth as directed.  . warfarin (COUMADIN) 7.5 MG tablet Take 1 tablet (7.5 mg total) by mouth daily.    Allergies: Patient has no known allergies.  Social History   Tobacco Use  . Smoking status: Former Smoker    Packs/day: 1.00    Years: 4.00    Pack years: 4.00    Types: Cigarettes    Quit date: 1998    Years since quitting: 23.0  . Smokeless tobacco: Never Used  Substance Use Topics  . Alcohol use: No  . Drug use: No    Family History  Problem Relation Age of Onset  . Heart attack Mother   . Hypertension Mother   . Diabetes Mother   . Emphysema Father        smoker  . Diabetes Maternal Grandmother   . Kidney disease Maternal Grandmother   . Breast cancer  Maternal Aunt   . Breast cancer Maternal Aunt   . Colon cancer Maternal Uncle     Review of Systems: A 12-system review of systems was performed and was negative except as noted in the HPI.  --------------------------------------------------------------------------------------------------  Physical Exam: BP 110/60 (BP Location: Left Arm, Patient Position: Sitting, Cuff Size: Large)   Pulse 77   Ht 5\' 2"  (1.575 m)   Wt 224 lb 8 oz (101.8 kg)   LMP  (LMP Unknown)   SpO2 98%   BMI 41.06 kg/m   General: NAD. HEENT: No conjunctival pallor or scleral icterus. Facemask in place. Neck: Supple without lymphadenopathy, thyromegaly, JVD, or HJR. Lungs: Normal work of breathing. Clear to auscultation bilaterally without wheezes or crackles. Heart: Regular rate and rhythm without murmurs, rubs, or gallops.  Mechanical S1 noted.  Unable to assess PMI due to body habitus. Abd: Bowel sounds present. Soft, NT/ND.  Unable to assess HSM due to body habitus. Ext: No lower extremity edema. Skin: Warm and dry without rash.  EKG: Normal sinus rhythm with nonspecific T wave changes.  No significant change from prior tracing on 03/09/2019.  Lab Results  Component Value Date   WBC 4.6 09/17/2019   HGB 12.8 09/17/2019   HCT 41.0 09/17/2019   MCV 90.3 09/17/2019   PLT 168 09/17/2019    Lab Results  Component Value Date   NA 140 09/17/2019   K 3.6 09/17/2019   CL 104 09/17/2019   CO2 28 09/17/2019   BUN 13 09/17/2019   CREATININE 0.89 09/17/2019   GLUCOSE 145 (H) 09/17/2019   ALT 15 09/17/2019    Lab Results  Component Value Date   CHOL 117 08/18/2018   HDL 40 08/18/2018   LDLCALC 58 08/18/2018   TRIG 96 08/18/2018   CHOLHDL 2.9 08/18/2018    --------------------------------------------------------------------------------------------------  ASSESSMENT AND PLAN: Chronic systolic heart failure: Patient appears euvolemic and well compensated with NYHA class II heart failure symptoms.   Blood pressure is low normal today; we will continue current doses of Entresto, metoprolol, and spironolactone.  Renal function and potassium were normal on the labs obtained earlier this week.  Coronary artery disease: No signs or symptoms to suggest worsening coronary insufficiency.  Continue aspirin and metoprolol for secondary prevention, as well as lipid control.  Status post mitral valve replacement: No signs or symptoms to suggest valve dysfunction.  Continue aspirin and warfarin (managed through PCP).  Importance of SBE prophylaxis for dental procedures was reinforced.  Status post ICD: Continue routine follow-up in the device clinic.  Hyperlipidemia: LDL well controlled on last check just over a year ago.  Recommend repeat lipid panel with next blood draw for target LDL less than 70.  Continue atorvastatin 80 mg daily.  Morbid obesity: BMI greater than 40 with multiple comorbidities.  Weight loss encouraged through diet and exercise.  Follow-up: Return to clinic in 3 months.  [name], MD 09/19/2019 9:57 AM

## 2019-09-19 ENCOUNTER — Other Ambulatory Visit: Payer: Self-pay

## 2019-09-19 ENCOUNTER — Encounter: Payer: Self-pay | Admitting: Internal Medicine

## 2019-09-19 ENCOUNTER — Ambulatory Visit (INDEPENDENT_AMBULATORY_CARE_PROVIDER_SITE_OTHER): Payer: Medicare Other | Admitting: Internal Medicine

## 2019-09-19 VITALS — BP 110/60 | HR 77 | Ht 62.0 in | Wt 224.5 lb

## 2019-09-19 DIAGNOSIS — I2510 Atherosclerotic heart disease of native coronary artery without angina pectoris: Secondary | ICD-10-CM | POA: Diagnosis not present

## 2019-09-19 DIAGNOSIS — I5022 Chronic systolic (congestive) heart failure: Secondary | ICD-10-CM

## 2019-09-19 DIAGNOSIS — Z95810 Presence of automatic (implantable) cardiac defibrillator: Secondary | ICD-10-CM | POA: Diagnosis not present

## 2019-09-19 DIAGNOSIS — E785 Hyperlipidemia, unspecified: Secondary | ICD-10-CM

## 2019-09-19 DIAGNOSIS — Z952 Presence of prosthetic heart valve: Secondary | ICD-10-CM

## 2019-09-19 NOTE — Patient Instructions (Signed)
Medication Instructions:  Your physician recommends that you continue on your current medications as directed. Please refer to the Current Medication list given to you today.  *If you need a refill on your cardiac medications before your next appointment, please call your pharmacy*  Lab Work: NONE If you have labs (blood work) drawn today and your tests are completely normal, you will receive your results only by: [name] MyChart Message (if you have MyChart) OR . A paper copy in the mail If you have any lab test that is abnormal or we need to change your treatment, we will call you to review the results.  Testing/Procedures: NONE  Follow-Up: At [facility], you and your health needs are our priority.  As part of our continuing mission to provide you with exceptional heart care, we have created designated Provider Care Teams.  These Care Teams include your primary Cardiologist (physician) and Advanced Practice Providers (APPs -  Physician Assistants and Nurse Practitioners) who all work together to provide you with the care you need, when you need it.  Your next appointment:   3 month(s)  The format for your next appointment:   In Person  Provider:    You may see [name], MD or one of the following Advanced Practice Providers on your designated Care Team:    Murray Hodgkins, NP  Christell Faith, PA-C  Marrianne Mood, PA-C

## 2019-09-20 ENCOUNTER — Encounter: Payer: Self-pay | Admitting: Internal Medicine

## 2019-09-20 ENCOUNTER — Encounter: Payer: Self-pay | Admitting: Oncology

## 2019-09-20 ENCOUNTER — Encounter: Payer: Medicare Other | Admitting: Family Medicine

## 2019-09-20 ENCOUNTER — Inpatient Hospital Stay (HOSPITAL_BASED_OUTPATIENT_CLINIC_OR_DEPARTMENT_OTHER): Payer: Medicare Other | Admitting: Oncology

## 2019-09-20 ENCOUNTER — Other Ambulatory Visit: Payer: Self-pay

## 2019-09-20 VITALS — BP 112/63 | HR 72 | Temp 96.9°F | Ht 62.5 in | Wt 225.6 lb

## 2019-09-20 DIAGNOSIS — I2510 Atherosclerotic heart disease of native coronary artery without angina pectoris: Secondary | ICD-10-CM | POA: Diagnosis not present

## 2019-09-20 DIAGNOSIS — I252 Old myocardial infarction: Secondary | ICD-10-CM | POA: Diagnosis not present

## 2019-09-20 DIAGNOSIS — Z87891 Personal history of nicotine dependence: Secondary | ICD-10-CM | POA: Diagnosis not present

## 2019-09-20 DIAGNOSIS — Z7982 Long term (current) use of aspirin: Secondary | ICD-10-CM | POA: Diagnosis not present

## 2019-09-20 DIAGNOSIS — I5042 Chronic combined systolic (congestive) and diastolic (congestive) heart failure: Secondary | ICD-10-CM | POA: Diagnosis not present

## 2019-09-20 DIAGNOSIS — I059 Rheumatic mitral valve disease, unspecified: Secondary | ICD-10-CM | POA: Diagnosis not present

## 2019-09-20 DIAGNOSIS — Z08 Encounter for follow-up examination after completed treatment for malignant neoplasm: Secondary | ICD-10-CM

## 2019-09-20 DIAGNOSIS — Z79899 Other long term (current) drug therapy: Secondary | ICD-10-CM | POA: Diagnosis not present

## 2019-09-20 DIAGNOSIS — Z85118 Personal history of other malignant neoplasm of bronchus and lung: Secondary | ICD-10-CM | POA: Diagnosis not present

## 2019-09-20 DIAGNOSIS — I110 Hypertensive heart disease with heart failure: Secondary | ICD-10-CM | POA: Diagnosis not present

## 2019-09-20 DIAGNOSIS — Z95810 Presence of automatic (implantable) cardiac defibrillator: Secondary | ICD-10-CM | POA: Diagnosis not present

## 2019-09-20 DIAGNOSIS — I255 Ischemic cardiomyopathy: Secondary | ICD-10-CM | POA: Diagnosis not present

## 2019-09-20 DIAGNOSIS — Z952 Presence of prosthetic heart valve: Secondary | ICD-10-CM | POA: Diagnosis not present

## 2019-09-20 DIAGNOSIS — Z7901 Long term (current) use of anticoagulants: Secondary | ICD-10-CM | POA: Diagnosis not present

## 2019-09-20 DIAGNOSIS — Z791 Long term (current) use of non-steroidal anti-inflammatories (NSAID): Secondary | ICD-10-CM | POA: Diagnosis not present

## 2019-09-20 NOTE — Progress Notes (Signed)
Patient stated that she had been doing well with no concerns. Patient fell three weeks ago due to her left knee giving out. She did not break any bones. Patient had her last mammogram on 09/12/2019 and everything was good.

## 2019-09-21 NOTE — Progress Notes (Signed)
Hematology/Oncology Consult note [facility]  [phone] [phone]  Patient Care Team: Virginia Crews, MD as PCP - General (Family Medicine) End, Harrell Gave, MD as PCP - Cardiology (Cardiology) Telford Nab, RN as Registered Nurse Nestor Lewandowsky, MD as Referring Physician (Cardiothoracic Surgery) Schuyler Amor, MD as Attending Physician (Emergency Medicine)   Name of the patient: Vanessa Carlson  [id number]  [birth year]   Date of visit: 09/21/19  Diagnosis-stage I lung cancer  Chief complaint/ Reason for visit-discuss CT scan results  Heme/Onc history: Patient is a 59 year old female with a past medical history significant for CAD, ischemic cardiomyopathy status post AICD placement, rheumatic heart disease status post mechanical mitral valve replacement on Coumadin.She was found to have a right upper lobe lung mass 2.5 cm that was hypermetabolic on PET CT scan in January 2019. No evidence of nodal involvement. She was not deemed to be a candidate for surgery and underwent SBRT to this lesion. This was biopsy-proven adenocarcinoma with acinar and solid morphologies. She then had a CT scan in January 2020 which showed enlarging satellite right upper lobe lung nodules. This was followed by a PET CT scan which showed right upper lobe lung nodules 2 of them were hypermetabolic with an SUV of 8.7 was also a thyroid nodule approximately 4 mm in size which was not definitely hypermetabolic. Patient was seen by Dr. Donella Stade following this andsh receivedSBRT for 5 fractions encompassing all these 3 nodules. Patient has not had any systemic chemotherapy for her lung cancer so far   Interval history-she is doing well overall and denies any specific complaints at this time.  Appetite and weight have remained stable.  Patient had a minor fall when she fell that her left knee gave out.  ECOG PS- 1 Pain scale- 0 Opioid associated  constipation- no  Review of systems- Review of Systems  Constitutional: Negative for chills, fever, malaise/fatigue and weight loss.  HENT: Negative for congestion, ear discharge and nosebleeds.   Eyes: Negative for blurred vision.  Respiratory: Negative for cough, hemoptysis, sputum production, shortness of breath and wheezing.   Cardiovascular: Negative for chest pain, palpitations, orthopnea and claudication.  Gastrointestinal: Negative for abdominal pain, blood in stool, constipation, diarrhea, heartburn, melena, nausea and vomiting.  Genitourinary: Negative for dysuria, flank pain, frequency, hematuria and urgency.  Musculoskeletal: Negative for back pain, joint pain and myalgias.  Skin: Negative for rash.  Neurological: Negative for dizziness, tingling, focal weakness, seizures, weakness and headaches.  Endo/Heme/Allergies: Does not bruise/bleed easily.  Psychiatric/Behavioral: Negative for depression and suicidal ideas. The patient does not have insomnia.        No Known Allergies   Past Medical History:  Diagnosis Date  . AICD (automatic cardioverter/defibrillator) present   . Anterior myocardial infarction [facility])    a. 10/2014 - occluded LAD, complicated by cardiogenic shock-->Med Rx as interventional team was unable to open LAD.  [name] Cancer of upper lobe of right lung ([place]) 09/2017   radiation therapy right side  . Cervical cancer ([place])    in remission for ~20 yrs, s/p hysterectomy  . Chronic combined systolic and diastolic CHF (congestive heart failure) ([place])    a. 10/2015 Echo: EF 20-25%, sev diast dysfxn, mildly reduced RV fxn. Nl MV prosthesis; b. 12/2017 Echo: EF 20-25%, diff HK. Sev ant/antsept HK, apical AK. Mild MS (mean graad 81mmHg). Mod TR. PASP 22mmHg.  [name] Coronary artery disease    a. 10/2014 Ant STEMI/Cath: LAD 100p ->Med managed as lesion  could not be crossed-->complicated by CGS and post-MI pericarditis ([place]).  . Essential hypertension   . Ischemic cardiomyopathy     a. 03/2015 s/p MDT single lead AICD;  b. 10/2015 Echo: EF 20-25%;  c. 12/2017 Echo: EF 20-25%, diff HK.  . Mass of upper lobe of right lung    a. noted on CXR & CT 08/2017 w/ abnl PET CT.  [name] Mitral valve disease    a. 2006 s/p MVR w/ 109mm SJM bileaflet mechanical valve-->chronic coumadin/ASA; b. 12/2017 Echo: Mild MS. Mean gradient 60mmHg.  [name] Personal history of radiation therapy    f/u lung ca     Past Surgical History:  Procedure Laterality Date  . APPENDECTOMY  1998  . BREAST EXCISIONAL BIOPSY Left 80s   benign  . CARDIAC CATHETERIZATION    . CARDIAC DEFIBRILLATOR PLACEMENT    . RADICAL HYSTERECTOMY  1998  . SHOULDER SURGERY Bilateral    rotator cuff tears  . VALVE REPLACEMENT     Mitral valve; 27 mm St. Jude bileaflet valve    Social History   Socioeconomic History  . Marital status: Married    Spouse name: Tosin  . Number of children: 2  . Years of education: 45  . Highest education level: Some college, no degree  Occupational History    Employer: [employer]  Tobacco Use  . Smoking status: Former Smoker    Packs/day: 1.00    Years: 4.00    Pack years: 4.00    Types: Cigarettes    Quit date: 1998    Years since quitting: 23.0  . Smokeless tobacco: Never Used  Substance and Sexual Activity  . Alcohol use: No  . Drug use: No  . Sexual activity: Yes    Partners: Male    Birth control/protection: Surgical  Other Topics Concern  . Not on file  Social History Narrative  . Not on file   Social Determinants of Health   Financial Resource Strain:   . Difficulty of Paying Living Expenses: Not on file  Food Insecurity:   . Worried About Charity fundraiser in the Last Year: Not on file  . Ran Out of Food in the Last Year: Not on file  Transportation Needs:   . Lack of Transportation (Medical): Not on file  . Lack of Transportation (Non-Medical): Not on file  Physical Activity:   . Days of Exercise per Week: Not on file  . Minutes of Exercise per  Session: Not on file  Stress:   . Feeling of Stress : Not on file  Social Connections:   . Frequency of Communication with Friends and Family: Not on file  . Frequency of Social Gatherings with Friends and Family: Not on file  . Attends Religious Services: Not on file  . Active Member of Clubs or Organizations: Not on file  . Attends Archivist Meetings: Not on file  . Marital Status: Not on file  Intimate Partner Violence:   . Fear of Current or Ex-Partner: Not on file  . Emotionally Abused: Not on file  . Physically Abused: Not on file  . Sexually Abused: Not on file    Family History  Problem Relation Age of Onset  . Heart attack Mother   . Hypertension Mother   . Diabetes Mother   . Emphysema Father        smoker  . Diabetes Maternal Grandmother   . Kidney disease Maternal Grandmother   . Breast cancer Maternal Aunt   .  Breast cancer Maternal Aunt   . Colon cancer Maternal Uncle      Current Outpatient Medications:  .  aspirin 81 MG chewable tablet, Chew 81 mg by mouth daily., Disp: , Rfl:  .  atorvastatin (LIPITOR) 80 MG tablet, Take 1 tablet by mouth once daily, Disp: 90 tablet, Rfl: 0 .  cyclobenzaprine (FLEXERIL) 5 MG tablet, Take 1 tablet (5 mg total) by mouth 3 (three) times daily as needed for muscle spasms., Disp: 30 tablet, Rfl: 1 .  diclofenac sodium (VOLTAREN) 1 % GEL, APPLY 2 GRAMS TOPICALLY 4 TIMES DAILY, Disp: 100 g, Rfl: 1 .  ENTRESTO 24-26 MG, TAKE 1 TABLET BY MOUTH TWICE DAILY, Disp: 180 tablet, Rfl: 3 .  fluticasone (FLONASE) 50 MCG/ACT nasal spray, Place 2 sprays into both nostrils as needed for allergies or rhinitis., Disp: , Rfl:  .  furosemide (LASIX) 40 MG tablet, Take 2 tablets (80 mg total) by mouth 2 (two) times daily., Disp: 360 tablet, Rfl: 2 .  gabapentin (NEURONTIN) 300 MG capsule, Take 1 capsule (300 mg total) by mouth 2 (two) times daily., Disp: 180 capsule, Rfl: 3 .  Lidocaine 5 % CREA, Apply 1 application topically every 6 (six)  hours as needed., Disp: 113 g, Rfl: 3 .  metoprolol succinate (TOPROL-XL) 50 MG 24 hr tablet, Take 1/2 tablet (25 mg) by mouth two times a day. Take with or immediately following a meal., Disp: 90 tablet, Rfl: 3 .  spironolactone (ALDACTONE) 25 MG tablet, Take 1 tablet by mouth once daily, Disp: 90 tablet, Rfl: 0 .  traMADol (ULTRAM) 50 MG tablet, Take 1 tablet (50 mg total) by mouth every 6 (six) hours as needed., Disp: 30 tablet, Rfl: 5 .  warfarin (COUMADIN) 10 MG tablet, Take 1 tablet (10 mg total) by mouth as directed., Disp: 30 tablet, Rfl: 3 .  warfarin (COUMADIN) 7.5 MG tablet, Take 1 tablet (7.5 mg total) by mouth daily., Disp: 90 tablet, Rfl: 3 .  nitroGLYCERIN (NITROSTAT) 0.3 MG SL tablet, Place 1 tablet (0.3 mg total) under the tongue every 5 (five) minutes as needed for chest pain. (Patient not taking: Reported on 09/20/2019), Disp: 30 tablet, Rfl: 3 .  traZODone (DESYREL) 50 MG tablet, Take 0.5-1 tablets (25-50 mg total) by mouth at bedtime as needed for sleep. (Patient not taking: Reported on 09/20/2019), Disp: 30 tablet, Rfl: 3  Physical exam:  Vitals:   09/20/19 1000  BP: 112/63  Pulse: 72  Temp: (!) 96.9 F (36.1 C)  TempSrc: Tympanic  Weight: 225 lb 9.6 oz (102.3 kg)  Height: 5' 2.5" (1.588 m)   Physical Exam HENT:     Head: Normocephalic and atraumatic.  Eyes:     Pupils: Pupils are equal, round, and reactive to light.  Cardiovascular:     Rate and Rhythm: Normal rate and regular rhythm.     Heart sounds: Normal heart sounds.  Pulmonary:     Effort: Pulmonary effort is normal.     Breath sounds: Normal breath sounds.  Abdominal:     General: Bowel sounds are normal.     Palpations: Abdomen is soft.  Musculoskeletal:     Cervical back: Normal range of motion.  Skin:    General: Skin is warm and dry.  Neurological:     Mental Status: She is alert and oriented to person, place, and time.      CMP Latest Ref Rng & Units 09/17/2019  Glucose 70 - 99 mg/dL 145(H)   BUN 6 -  20 mg/dL 13  Creatinine 0.44 - 1.00 mg/dL 0.89  Sodium 135 - 145 mmol/L 140  Potassium 3.5 - 5.1 mmol/L 3.6  Chloride 98 - 111 mmol/L 104  CO2 22 - 32 mmol/L 28  Calcium 8.9 - 10.3 mg/dL 9.1  Total Protein 6.5 - 8.1 g/dL 7.9  Total Bilirubin 0.3 - 1.2 mg/dL 1.1  Alkaline Phos 38 - 126 U/L 70  AST 15 - 41 U/L 21  ALT 0 - 44 U/L 15   CBC Latest Ref Rng & Units 09/17/2019  WBC 4.0 - 10.5 K/uL 4.6  Hemoglobin 12.0 - 15.0 g/dL 12.8  Hematocrit 36.0 - 46.0 % 41.0  Platelets 150 - 400 K/uL 168    No images are attached to the encounter.  CT Chest W Contrast  Result Date: 09/17/2019 CLINICAL DATA:  Right upper lobe lung cancer, status post SBRT EXAM: CT CHEST, ABDOMEN, AND PELVIS WITH CONTRAST TECHNIQUE: Multidetector CT imaging of the chest, abdomen and pelvis was performed following the standard protocol during bolus administration of intravenous contrast. CONTRAST:  166mL OMNIPAQUE IOHEXOL 300 MG/ML SOLN, additional oral enteric contrast COMPARISON:  03/08/2019, 01/17/2019 FINDINGS: CT CHEST FINDINGS Cardiovascular: Cardiomegaly. Extensive 3 vessel coronary artery calcifications and/or stents. Left chest pacer defibrillator. Mitral valve prosthesis. No pericardial effusion. Aortic atherosclerosis. Mediastinum/Nodes: No enlarged mediastinal, hilar, or axillary lymph nodes. Unchanged 1.0 cm nodule of the posterior left lobe of the thyroid (series 507, image 9). Trachea, and esophagus demonstrate no significant findings. Lungs/Pleura: Interval increase in bandlike post treatment consolidation and fibrosis of the right upper lobe (series 509, image 45). Mild centrilobular emphysema. Unchanged tiny, likely benign nodule of the left upper lobe (series 509, image 40). No pleural effusion or pneumothorax. Musculoskeletal: No chest wall mass or suspicious bone lesions identified. CT ABDOMEN PELVIS FINDINGS Hepatobiliary: No solid liver abnormality is seen. No gallstones, gallbladder wall  thickening, or biliary dilatation. Pancreas: Unremarkable. No pancreatic ductal dilatation or surrounding inflammatory changes. Spleen: Normal in size without significant abnormality. Adrenals/Urinary Tract: Adrenal glands are unremarkable. Kidneys are normal, without renal calculi, solid lesion, or hydronephrosis. Bladder is unremarkable. Stomach/Bowel: Stomach is within normal limits. Appendix appears normal. No evidence of bowel wall thickening, distention, or inflammatory changes. Vascular/Lymphatic: Aortic atherosclerosis. No enlarged abdominal or pelvic lymph nodes. Reproductive: Status post hysterectomy. Unchanged small fluid attenuation cysts of the left ovary. Other: No abdominal wall hernia or abnormality. No abdominopelvic ascites. Musculoskeletal: No acute or significant osseous findings. IMPRESSION: 1. Interval increase in bandlike post treatment consolidation and fibrosis of the right upper lobe (series 509, image 45), consistent with evolution of radiation fibrosis. 2. No evidence of metastatic disease within the chest, abdomen or pelvis. 3. Unchanged tiny, likely benign nodule of the left upper lobe (series 509, image 40). Attention on follow-up. 4. Emphysema (ICD10-J43.9). 5. Cardiomegaly. Extensive 3 vessel coronary artery calcifications and/or stents. Mitral valve prosthesis. 6. Aortic Atherosclerosis (ICD10-I70.0) Electronically Signed   By: Eddie Candle M.D.   On: 09/17/2019 10:15   CT ABDOMEN PELVIS W CONTRAST  Result Date: 09/17/2019 CLINICAL DATA:  Right upper lobe lung cancer, status post SBRT EXAM: CT CHEST, ABDOMEN, AND PELVIS WITH CONTRAST TECHNIQUE: Multidetector CT imaging of the chest, abdomen and pelvis was performed following the standard protocol during bolus administration of intravenous contrast. CONTRAST:  125mL OMNIPAQUE IOHEXOL 300 MG/ML SOLN, additional oral enteric contrast COMPARISON:  03/08/2019, 01/17/2019 FINDINGS: CT CHEST FINDINGS Cardiovascular: Cardiomegaly.  Extensive 3 vessel coronary artery calcifications and/or stents. Left chest pacer defibrillator. Mitral valve prosthesis.  No pericardial effusion. Aortic atherosclerosis. Mediastinum/Nodes: No enlarged mediastinal, hilar, or axillary lymph nodes. Unchanged 1.0 cm nodule of the posterior left lobe of the thyroid (series 507, image 9). Trachea, and esophagus demonstrate no significant findings. Lungs/Pleura: Interval increase in bandlike post treatment consolidation and fibrosis of the right upper lobe (series 509, image 45). Mild centrilobular emphysema. Unchanged tiny, likely benign nodule of the left upper lobe (series 509, image 40). No pleural effusion or pneumothorax. Musculoskeletal: No chest wall mass or suspicious bone lesions identified. CT ABDOMEN PELVIS FINDINGS Hepatobiliary: No solid liver abnormality is seen. No gallstones, gallbladder wall thickening, or biliary dilatation. Pancreas: Unremarkable. No pancreatic ductal dilatation or surrounding inflammatory changes. Spleen: Normal in size without significant abnormality. Adrenals/Urinary Tract: Adrenal glands are unremarkable. Kidneys are normal, without renal calculi, solid lesion, or hydronephrosis. Bladder is unremarkable. Stomach/Bowel: Stomach is within normal limits. Appendix appears normal. No evidence of bowel wall thickening, distention, or inflammatory changes. Vascular/Lymphatic: Aortic atherosclerosis. No enlarged abdominal or pelvic lymph nodes. Reproductive: Status post hysterectomy. Unchanged small fluid attenuation cysts of the left ovary. Other: No abdominal wall hernia or abnormality. No abdominopelvic ascites. Musculoskeletal: No acute or significant osseous findings. IMPRESSION: 1. Interval increase in bandlike post treatment consolidation and fibrosis of the right upper lobe (series 509, image 45), consistent with evolution of radiation fibrosis. 2. No evidence of metastatic disease within the chest, abdomen or pelvis. 3. Unchanged  tiny, likely benign nodule of the left upper lobe (series 509, image 40). Attention on follow-up. 4. Emphysema (ICD10-J43.9). 5. Cardiomegaly. Extensive 3 vessel coronary artery calcifications and/or stents. Mitral valve prosthesis. 6. Aortic Atherosclerosis (ICD10-I70.0) Electronically Signed   By: Eddie Candle M.D.   On: 09/17/2019 10:15   MM 3D SCREEN BREAST BILATERAL  Result Date: 09/12/2019 CLINICAL DATA:  Screening. EXAM: DIGITAL SCREENING BILATERAL MAMMOGRAM WITH TOMO AND CAD COMPARISON:  Previous exam(s). ACR Breast Density Category b: There are scattered areas of fibroglandular density. FINDINGS: There are no findings suspicious for malignancy. Images were processed with CAD. IMPRESSION: No mammographic evidence of malignancy. A result letter of this screening mammogram will be mailed directly to the patient. RECOMMENDATION: Screening mammogram in one year. (Code:[id number]-B-01Y) BI-RADS CATEGORY  1: Negative. Electronically Signed   By: Curlene Dolphin M.D.   On: 09/12/2019 13:07     Assessment and plan- Patient is a 59 y.o. female history of adenocarcinoma of the left upper lobe status post SBRT.    She is here to discuss CT scan results and further management  I have personally reviewed CT chest abdomen and pelvis images independently and discussed findings with the patient.Currently there are no concerning findings of recurrent or metastatic disease.  There continues to be involving radiation changes and fibrosis noted in the right upper lobe which we will continue to monitor.  I will see her back in 6 months with a CT chest without contrast prior   Visit Diagnosis 1. Encounter for follow-up surveillance of lung cancer      Dr. Randa Evens, MD, MPH [facility] [phone] 09/21/2019 12:13 PM

## 2019-09-25 ENCOUNTER — Other Ambulatory Visit: Payer: Self-pay

## 2019-09-25 ENCOUNTER — Telehealth: Payer: Self-pay

## 2019-09-25 ENCOUNTER — Ambulatory Visit (INDEPENDENT_AMBULATORY_CARE_PROVIDER_SITE_OTHER): Payer: Medicare Other | Admitting: *Deleted

## 2019-09-25 ENCOUNTER — Ambulatory Visit (INDEPENDENT_AMBULATORY_CARE_PROVIDER_SITE_OTHER): Payer: Medicare Other

## 2019-09-25 DIAGNOSIS — I480 Paroxysmal atrial fibrillation: Secondary | ICD-10-CM | POA: Diagnosis not present

## 2019-09-25 DIAGNOSIS — Z952 Presence of prosthetic heart valve: Secondary | ICD-10-CM

## 2019-09-25 LAB — CUP PACEART REMOTE DEVICE CHECK
Battery Remaining Longevity: 98 mo
Battery Voltage: 3.01 V
Brady Statistic RV Percent Paced: 0 %
Date Time Interrogation Session: 20210126012204
HighPow Impedance: 83 Ohm
Implantable Lead Implant Date: 20160802
Implantable Lead Location: 753860
Implantable Pulse Generator Implant Date: 20160802
Lead Channel Impedance Value: 570 Ohm
Lead Channel Impedance Value: 665 Ohm
Lead Channel Pacing Threshold Amplitude: 0.625 V
Lead Channel Pacing Threshold Pulse Width: 0.4 ms
Lead Channel Sensing Intrinsic Amplitude: 13.75 mV
Lead Channel Sensing Intrinsic Amplitude: 13.75 mV
Lead Channel Setting Pacing Amplitude: 2 V
Lead Channel Setting Pacing Pulse Width: 0.4 ms
Lead Channel Setting Sensing Sensitivity: 0.3 mV

## 2019-09-25 LAB — POCT INR
INR: 2.4 (ref 2.0–3.0)
PT: 29.3

## 2019-09-25 NOTE — Patient Instructions (Signed)
Description   Change: Today take one and a half tablet per Dr. Brita Romp then continue back at 7.5mg  the following day on.

## 2019-09-25 NOTE — Telephone Encounter (Signed)
LMTCB- patient scheduled for 4 weeks but instead of 4 weeks she needs a 2 week appt. For her INR in the coumadin schedule. If patient calls back please schedule patient for a 2 week INR (10/09/2019) thanks. Ok for [facility] to schedule.  Thanks, -Lillionna Nabi R.

## 2019-09-26 NOTE — Telephone Encounter (Signed)
appt scheduled

## 2019-10-03 ENCOUNTER — Telehealth: Payer: Self-pay

## 2019-10-03 NOTE — Telephone Encounter (Signed)
LMTCB to inquire about telephonic AWV prior to CPE on 10/09/19.

## 2019-10-06 ENCOUNTER — Encounter: Payer: Self-pay | Admitting: Family Medicine

## 2019-10-06 DIAGNOSIS — G8929 Other chronic pain: Secondary | ICD-10-CM

## 2019-10-07 ENCOUNTER — Encounter: Payer: Self-pay | Admitting: Family Medicine

## 2019-10-08 NOTE — Telephone Encounter (Signed)
May need appt for f/u on this. If it hasn't been long since appt, we could place Ortho referral if she desires

## 2019-10-08 NOTE — Telephone Encounter (Signed)
Pt agreed to Ortho referral.   Thanks,   -Mickel Baas

## 2019-10-08 NOTE — Telephone Encounter (Signed)
See other message as well.  Looks like it has been a long time since we have seen her for knee pain.  Can we get her scheduled for f/u appt

## 2019-10-09 ENCOUNTER — Other Ambulatory Visit: Payer: Self-pay

## 2019-10-09 ENCOUNTER — Ambulatory Visit (INDEPENDENT_AMBULATORY_CARE_PROVIDER_SITE_OTHER): Payer: Medicare Other

## 2019-10-09 DIAGNOSIS — Z952 Presence of prosthetic heart valve: Secondary | ICD-10-CM | POA: Diagnosis not present

## 2019-10-09 LAB — POCT INR
INR: 3.7 — AB (ref 2.0–3.0)
PT: 43.8

## 2019-10-09 NOTE — Patient Instructions (Signed)
Description   Change: Today half tablet today then continue back at 7.5mg  the following day on. Return in 2 weeks

## 2019-10-10 NOTE — Telephone Encounter (Signed)
Scheduled telephonic AWV for 10/15/19 @ 2:40 PM. CPE is now scheduled for 11/13/19.

## 2019-10-11 NOTE — Progress Notes (Signed)
Subjective:   Vanessa Carlson is a 59 y.o. female who presents for an Initial Medicare Annual Wellness Visit.    This visit is being conducted through telemedicine due to the COVID-19 pandemic. This patient has given me verbal consent via doximity to conduct this visit, patient states they are participating from their home address. Some vital signs may be absent or patient reported.    Patient identification: identified by name, DOB, and current address  Review of Systems    N/A  Cardiac Risk Factors include: advanced age (>69men, >51 women);dyslipidemia;hypertension;obesity (BMI >30kg/m2)     Objective:    Today's Vitals   10/15/19 1440  PainSc: 6    There is no height or weight on file to calculate BMI. Unable to obtain vitals due to visit being conducted via telephonically.   Advanced Directives 10/15/2019 09/20/2019 03/16/2019 01/19/2019 10/16/2018 10/10/2018 09/27/2018  Does Patient Have a Medical Advance Directive? No No No No No No No  Would patient like information on creating a medical advance directive? No - Patient declined No - Patient declined - No - Patient declined No - Patient declined No - Patient declined No - Patient declined    Current Medications (verified) Outpatient Encounter Medications as of 10/15/2019  Medication Sig  . aspirin 81 MG chewable tablet Chew 81 mg by mouth daily.  [name] atorvastatin (LIPITOR) 80 MG tablet Take 1 tablet by mouth once daily  . cyclobenzaprine (FLEXERIL) 5 MG tablet Take 1 tablet (5 mg total) by mouth 3 (three) times daily as needed for muscle spasms.  . diclofenac sodium (VOLTAREN) 1 % GEL APPLY 2 GRAMS TOPICALLY 4 TIMES DAILY  . ENTRESTO 24-26 MG TAKE 1 TABLET BY MOUTH TWICE DAILY  . fluticasone (FLONASE) 50 MCG/ACT nasal spray Place 2 sprays into both nostrils as needed for allergies or rhinitis.  . furosemide (LASIX) 40 MG tablet Take 2 tablets (80 mg total) by mouth 2 (two) times daily.  [name] gabapentin (NEURONTIN) 300 MG  capsule Take 1 capsule (300 mg total) by mouth 2 (two) times daily.  . Lidocaine 5 % CREA Apply 1 application topically every 6 (six) hours as needed.  . metoprolol succinate (TOPROL-XL) 50 MG 24 hr tablet Take 1/2 tablet (25 mg) by mouth two times a day. Take with or immediately following a meal.  . nitroGLYCERIN (NITROSTAT) 0.3 MG SL tablet Place 1 tablet (0.3 mg total) under the tongue every 5 (five) minutes as needed for chest pain.  [name] spironolactone (ALDACTONE) 25 MG tablet Take 1 tablet by mouth once daily  . traMADol (ULTRAM) 50 MG tablet Take 1 tablet (50 mg total) by mouth every 6 (six) hours as needed.  . traZODone (DESYREL) 50 MG tablet Take 0.5-1 tablets (25-50 mg total) by mouth at bedtime as needed for sleep.  [name] warfarin (COUMADIN) 7.5 MG tablet Take 1 tablet (7.5 mg total) by mouth daily.  [name] warfarin (COUMADIN) 10 MG tablet Take 1 tablet (10 mg total) by mouth as directed. (Patient not taking: Reported on 10/15/2019)   No facility-administered encounter medications on file as of 10/15/2019.    Allergies (verified) Patient has no known allergies.   History: Past Medical History:  Diagnosis Date  . AICD (automatic cardioverter/defibrillator) present   . Anterior myocardial infarction [facility])    a. 10/2014 - occluded LAD, complicated by cardiogenic shock-->Med Rx as interventional team was unable to open LAD.  [name] Cancer of upper lobe of right lung ([place]) 09/2017   radiation therapy right  side  . Cervical cancer ([place])    in remission for ~20 yrs, s/p hysterectomy  . Chronic combined systolic and diastolic CHF (congestive heart failure) ([place])    a. 10/2015 Echo: EF 20-25%, sev diast dysfxn, mildly reduced RV fxn. Nl MV prosthesis; b. 12/2017 Echo: EF 20-25%, diff HK. Sev ant/antsept HK, apical AK. Mild MS (mean graad 25mmHg). Mod TR. PASP 78mmHg.  [name] Coronary artery disease    a. 10/2014 Ant STEMI/Cath: LAD 100p ->Med managed as lesion could not be crossed-->complicated by CGS and post-MI  pericarditis ([facility]).  . Essential hypertension   . Ischemic cardiomyopathy    a. 03/2015 s/p MDT single lead AICD;  b. 10/2015 Echo: EF 20-25%;  c. 12/2017 Echo: EF 20-25%, diff HK.  . Mass of upper lobe of right lung    a. noted on CXR & CT 08/2017 w/ abnl PET CT.  [name] Mitral valve disease    a. 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 12/2017 Echo: Mild MS. Mean gradient 69mmHg.  [name] Personal history of radiation therapy    f/u lung ca   Past Surgical History:  Procedure Laterality Date  . APPENDECTOMY  1998  . BREAST EXCISIONAL BIOPSY Left 80s   benign  . CARDIAC CATHETERIZATION    . CARDIAC DEFIBRILLATOR PLACEMENT    . RADICAL HYSTERECTOMY  1998  . SHOULDER SURGERY Bilateral    rotator cuff tears  . VALVE REPLACEMENT     Mitral valve; 27 mm St. Jude bileaflet valve   Family History  Problem Relation Age of Onset  . Heart attack Mother   . Hypertension Mother   . Diabetes Mother   . Emphysema Father        smoker  . Diabetes Maternal Grandmother   . Kidney disease Maternal Grandmother   . Breast cancer Maternal Aunt   . Breast cancer Maternal Aunt   . Colon cancer Maternal Uncle    Social History   Socioeconomic History  . Marital status: Married    Spouse name: Tosin  . Number of children: 2  . Years of education: 47  . Highest education level: Some college, no degree  Occupational History    Employer: [employer]  Tobacco Use  . Smoking status: Former Smoker    Packs/day: 1.00    Years: 4.00    Pack years: 4.00    Types: Cigarettes    Quit date: 1998    Years since quitting: 23.1  . Smokeless tobacco: Never Used  Substance and Sexual Activity  . Alcohol use: No  . Drug use: No  . Sexual activity: Yes    Partners: Male    Birth control/protection: Surgical  Other Topics Concern  . Not on file  Social History Narrative  . Not on file   Social Determinants of Health   Financial Resource Strain: Low Risk   .  Difficulty of Paying Living Expenses: Not hard at all  Food Insecurity: No Food Insecurity  . Worried About Charity fundraiser in the Last Year: Never true  . Ran Out of Food in the Last Year: Never true  Transportation Needs: No Transportation Needs  . Lack of Transportation (Medical): No  . Lack of Transportation (Non-Medical): No  Physical Activity: Inactive  . Days of Exercise per Week: 0 days  . Minutes of Exercise per Session: 0 min  Stress: No Stress Concern Present  . Feeling of Stress : Not at all  Social Connections: Slightly  Isolated  . Frequency of Communication with Friends and Family: More than three times a week  . Frequency of Social Gatherings with Friends and Family: Twice a week  . Attends Religious Services: More than 4 times per year  . Active Member of Clubs or Organizations: No  . Attends Archivist Meetings: Never  . Marital Status: Married    Tobacco Counseling Counseling given: Not Answered   Clinical Intake:  Pre-visit preparation completed: Yes  Pain : 0-10 Pain Score: 6  Pain Type: Acute pain Pain Location: Knee Pain Orientation: Right Pain Descriptors / Indicators: Aching Pain Onset: 1 to 4 weeks ago Pain Frequency: Constant     Nutritional Risks: None Diabetes: No  How often do you need to have someone help you when you read instructions, pamphlets, or other written materials from your doctor or pharmacy?: 1 - Never  Interpreter Needed?: No  Information entered by :: [facility], LPN   Activities of Daily Living In your present state of health, do you have any difficulty performing the following activities: 10/15/2019  Hearing? N  Vision? N  Difficulty concentrating or making decisions? N  Walking or climbing stairs? N  Dressing or bathing? N  Doing errands, shopping? N  Preparing Food and eating ? N  Using the Toilet? N  In the past six months, have you accidently leaked urine? N  Do you have problems with loss of  bowel control? N  Managing your Medications? N  Managing your Finances? N  Housekeeping or managing your Housekeeping? N  Some recent data might be hidden     Immunizations and Health Maintenance Immunization History  Administered Date(s) Administered  . Influenza Split 05/17/2016  . Influenza,inj,Quad PF,6+ Mos 06/20/2015, 07/08/2017, 09/08/2018  . Pneumococcal Polysaccharide-23 12/03/2008   Health Maintenance Due  Topic Date Due  . HIV Screening  12/30/1974  . COLONOSCOPY  12/29/2009  . INFLUENZA VACCINE  03/31/2019    Patient Care Team: Virginia Crews, MD as PCP - General (Family Medicine) End, Harrell Gave, MD as PCP - Cardiology (Cardiology) Nestor Lewandowsky, MD as Referring Physician (Cardiothoracic Surgery)  Indicate any recent Medical Services you may have received from other than [facility] providers in the past year (date may be approximate).     Assessment:   This is a routine wellness examination for Maryjane.  Hearing/Vision screen No exam data present  Dietary issues and exercise activities discussed: Current Exercise Habits: The patient does not participate in regular exercise at present, Exercise limited by: orthopedic condition(s)  Goals    . LIFESTYLE - DECREASE FALLS RISK     Recommend to remove any items from the home that may cause slips or trips.      Depression Screen PHQ 2/9 Scores 10/15/2019 05/31/2018 03/29/2018 03/08/2018 02/28/2018 12/15/2017 09/06/2017  PHQ - 2 Score 0 0 0 0 0 0 0  PHQ- 9 Score - - - - - - -    Fall Risk Fall Risk  10/15/2019 05/31/2018 03/29/2018 03/08/2018 02/28/2018  Falls in the past year? 1 No No No No  Number falls in past yr: 0 - - - -  Injury with Fall? 1 - - - -  Comment sprained right knee - - - -  Follow up Falls prevention discussed - - - -   FALL RISK PREVENTION PERTAINING TO THE HOME:  Any stairs in or around the home? Yes  If so, are there any without handrails? No   Home free of loose throw rugs in  walkways, pet  beds, electrical cords, etc? Yes  Adequate lighting in your home to reduce risk of falls? Yes   ASSISTIVE DEVICES UTILIZED TO PREVENT FALLS:  Life alert? No  Use of a cane, walker or w/c? No  Grab bars in the bathroom? Yes  Shower chair or bench in shower? No  Elevated toilet seat or a handicapped toilet? No    TIMED UP AND GO:  Was the test performed? No .     Cognitive Function:     6CIT Screen 10/15/2019 03/08/2018  What Year? 0 points 0 points  What month? 0 points 0 points  What time? 0 points 0 points  Count back from 20 0 points 0 points  Months in reverse 2 points 0 points  Repeat phrase 0 points 2 points  Total Score 2 2    Screening Tests Health Maintenance  Topic Date Due  . HIV Screening  12/30/1974  . COLONOSCOPY  12/29/2009  . INFLUENZA VACCINE  03/31/2019  . TETANUS/TDAP  09/09/2023 (Originally 12/30/1978)  . PAP SMEAR-Modifier  03/08/2021  . MAMMOGRAM  09/11/2021  . Hepatitis C Screening  Completed    Qualifies for Shingles Vaccine? No  Tdap: Although this vaccine is not a covered service during a Wellness Exam, does the patient still wish to receive this vaccine today?  No . Advised may receive this vaccine at local pharmacy or Health Dept. Aware to provide a copy of the vaccination record if obtained from local pharmacy or Health Dept. Verbalized acceptance and understanding.  Flu Vaccine: Due for Flu vaccine. Does the patient want to receive this vaccine today?  No . Advised may receive this vaccine at local pharmacy or Health Dept. Aware to provide a copy of the vaccination record if obtained from local pharmacy or Health Dept. Verbalized acceptance and understanding.   Cancer Screenings:  Colorectal Screening: Currently due. Referral to GI placed today. Pt aware the office will call re: appt.  Mammogram: Completed 09/12/19. Repeat every one to two years as advised.   Lung Cancer Screening: (Low Dose CT Chest recommended if Age 45-80 years, 30  pack-year currently smoking OR have quit w/in 15years.) does not qualify.   Additional Screening:  Hepatitis C Screening: Up to date  Vision Screening: Recommended annual ophthalmology exams for early detection of glaucoma and other disorders of the eye.  Dental Screening: Recommended annual dental exams for proper oral hygiene  Community Resource Referral:  CRR required this visit?  No       Plan:  I have personally reviewed and addressed the Medicare Annual Wellness questionnaire and have noted the following in the patient's chart:  A. Medical and social history B. Use of alcohol, tobacco or illicit drugs  C. Current medications and supplements D. Functional ability and status E.  Nutritional status F.  Physical activity G. Advance directives H. List of other physicians I.  Hospitalizations, surgeries, and ER visits in previous 12 months J.  [place] such as hearing and vision if needed, cognitive and depression L. Referrals and appointments   In addition, I have reviewed and discussed with patient certain preventive protocols, quality metrics, and best practice recommendations. A written personalized care plan for preventive services as well as general preventive health recommendations were provided to patient.   Glendora Score, [place]   3/32/9518  Nurse Health Advisor   Nurse Notes: Pt needs a flu shot and HIV lab test at next in office apt.

## 2019-10-15 ENCOUNTER — Ambulatory Visit (INDEPENDENT_AMBULATORY_CARE_PROVIDER_SITE_OTHER): Payer: Medicare Other

## 2019-10-15 ENCOUNTER — Other Ambulatory Visit: Payer: Self-pay

## 2019-10-15 DIAGNOSIS — Z1211 Encounter for screening for malignant neoplasm of colon: Secondary | ICD-10-CM | POA: Diagnosis not present

## 2019-10-15 DIAGNOSIS — Z0000 Encounter for general adult medical examination without abnormal findings: Secondary | ICD-10-CM | POA: Diagnosis not present

## 2019-10-15 NOTE — Addendum Note (Signed)
Addended by: Fabio Neighbors A on: 10/15/2019 03:32 PM   Modules accepted: Miquel Dunn

## 2019-10-15 NOTE — Patient Instructions (Signed)
Vanessa Carlson , Thank you for taking time to come for your Medicare Wellness Visit. I appreciate your ongoing commitment to your health goals. Please review the following plan we discussed and let me know if I can assist you in the future.   Screening recommendations/referrals: Colonoscopy: Referral to GI placed today. Pt aware the office will call re: appt. Mammogram: Up to date, due 08/2021 Recommended yearly ophthalmology/optometry visit for glaucoma screening and checkup Recommended yearly dental visit for hygiene and checkup  Vaccinations: Influenza vaccine: Currently due, pt to receive at next in office apt or at local pharmacy. Tdap vaccine: Pt declines today.     Advanced directives: Advance directive discussed with you today. Even though you declined this today please call our office should you change your mind and we can give you the proper paperwork for you to fill out.  Conditions/risks identified: Fall risk prevention discussed today.   Next appointment: 10/22/18 @ 11:10 AM for a PT/INR check.   Preventive Care 60 Years and Older, Female Preventive care refers to lifestyle choices and visits with your health care provider that can promote health and wellness. What does preventive care include?  A yearly physical exam. This is also called an annual well check.  Dental exams once or twice a year.  Routine eye exams. Ask your health care provider how often you should have your eyes checked.  Personal lifestyle choices, including:  Daily care of your teeth and gums.  Regular physical activity.  Eating a healthy diet.  Avoiding tobacco and drug use.  Limiting alcohol use.  Practicing safe sex.  Taking low-dose aspirin every day.  Taking vitamin and mineral supplements as recommended by your health care provider. What happens during an annual well check? The services and screenings done by your health care provider during your annual well check will depend on your  age, overall health, lifestyle risk factors, and family history of disease. Counseling  Your health care provider may ask you questions about your:  Alcohol use.  Tobacco use.  Drug use.  Emotional well-being.  Home and relationship well-being.  Sexual activity.  Eating habits.  History of falls.  Memory and ability to understand (cognition).  Work and work Statistician.  Reproductive health. Screening  You may have the following tests or measurements:  Height, weight, and BMI.  Blood pressure.  Lipid and cholesterol levels. These may be checked every 5 years, or more frequently if you are over 78 years old.  Skin check.  Lung cancer screening. You may have this screening every year starting at age 64 if you have a 30-pack-year history of smoking and currently smoke or have quit within the past 15 years.  Fecal occult blood test (FOBT) of the stool. You may have this test every year starting at age 60.  Flexible sigmoidoscopy or colonoscopy. You may have a sigmoidoscopy every 5 years or a colonoscopy every 10 years starting at age 60.  Hepatitis C blood test.  Hepatitis B blood test.  Sexually transmitted disease (STD) testing.  Diabetes screening. This is done by checking your blood sugar (glucose) after you have not eaten for a while (fasting). You may have this done every 1-3 years.  Bone density scan. This is done to screen for osteoporosis. You may have this done starting at age 60.  Mammogram. This may be done every 1-2 years. Talk to your health care provider about how often you should have regular mammograms. Talk with your health care provider about  your test results, treatment options, and if necessary, the need for more tests. Vaccines  Your health care provider may recommend certain vaccines, such as:  Influenza vaccine. This is recommended every year.  Tetanus, diphtheria, and acellular pertussis (Tdap, Td) vaccine. You may need a Td booster  every 10 years.  Zoster vaccine. You may need this after age 18.  Pneumococcal 13-valent conjugate (PCV13) vaccine. One dose is recommended after age 60.  Pneumococcal polysaccharide (PPSV23) vaccine. One dose is recommended after age 62. Talk to your health care provider about which screenings and vaccines you need and how often you need them. This information is not intended to replace advice given to you by your health care provider. Make sure you discuss any questions you have with your health care provider. Document Released: 09/12/2015 Document Revised: 05/05/2016 Document Reviewed: 06/17/2015 Elsevier Interactive Patient Education  2017 [place] Prevention in the Home Falls can cause injuries. They can happen to people of all ages. There are many things you can do to make your home safe and to help prevent falls. What can I do on the outside of my home?  Regularly fix the edges of walkways and driveways and fix any cracks.  Remove anything that might make you trip as you walk through a door, such as a raised step or threshold.  Trim any bushes or trees on the path to your home.  Use bright outdoor lighting.  Clear any walking paths of anything that might make someone trip, such as rocks or tools.  Regularly check to see if handrails are loose or broken. Make sure that both sides of any steps have handrails.  Any raised decks and porches should have guardrails on the edges.  Have any leaves, snow, or ice cleared regularly.  Use sand or salt on walking paths during winter.  Clean up any spills in your garage right away. This includes oil or grease spills. What can I do in the bathroom?  Use night lights.  Install grab bars by the toilet and in the tub and shower. Do not use towel bars as grab bars.  Use non-skid mats or decals in the tub or shower.  If you need to sit down in the shower, use a plastic, non-slip stool.  Keep the floor dry. Clean up any  water that spills on the floor as soon as it happens.  Remove soap buildup in the tub or shower regularly.  Attach bath mats securely with double-sided non-slip rug tape.  Do not have throw rugs and other things on the floor that can make you trip. What can I do in the bedroom?  Use night lights.  Make sure that you have a light by your bed that is easy to reach.  Do not use any sheets or blankets that are too big for your bed. They should not hang down onto the floor.  Have a firm chair that has side arms. You can use this for support while you get dressed.  Do not have throw rugs and other things on the floor that can make you trip. What can I do in the kitchen?  Clean up any spills right away.  Avoid walking on wet floors.  Keep items that you use a lot in easy-to-reach places.  If you need to reach something above you, use a strong step stool that has a grab bar.  Keep electrical cords out of the way.  Do not use floor polish or wax  that makes floors slippery. If you must use wax, use non-skid floor wax.  Do not have throw rugs and other things on the floor that can make you trip. What can I do with my stairs?  Do not leave any items on the stairs.  Make sure that there are handrails on both sides of the stairs and use them. Fix handrails that are broken or loose. Make sure that handrails are as long as the stairways.  Check any carpeting to make sure that it is firmly attached to the stairs. Fix any carpet that is loose or worn.  Avoid having throw rugs at the top or bottom of the stairs. If you do have throw rugs, attach them to the floor with carpet tape.  Make sure that you have a light switch at the top of the stairs and the bottom of the stairs. If you do not have them, ask someone to add them for you. What else can I do to help prevent falls?  Wear shoes that:  Do not have high heels.  Have rubber bottoms.  Are comfortable and fit you well.  Are closed  at the toe. Do not wear sandals.  If you use a stepladder:  Make sure that it is fully opened. Do not climb a closed stepladder.  Make sure that both sides of the stepladder are locked into place.  Ask someone to hold it for you, if possible.  Clearly mark and make sure that you can see:  Any grab bars or handrails.  First and last steps.  Where the edge of each step is.  Use tools that help you move around (mobility aids) if they are needed. These include:  Canes.  Walkers.  Scooters.  Crutches.  Turn on the lights when you go into a dark area. Replace any light bulbs as soon as they burn out.  Set up your furniture so you have a clear path. Avoid moving your furniture around.  If any of your floors are uneven, fix them.  If there are any pets around you, be aware of where they are.  Review your medicines with your doctor. Some medicines can make you feel dizzy. This can increase your chance of falling. Ask your doctor what other things that you can do to help prevent falls. This information is not intended to replace advice given to you by your health care provider. Make sure you discuss any questions you have with your health care provider. Document Released: 06/12/2009 Document Revised: 01/22/2016 Document Reviewed: 09/20/2014 Elsevier Interactive Patient Education  2017 [employer].

## 2019-10-16 DIAGNOSIS — M1711 Unilateral primary osteoarthritis, right knee: Secondary | ICD-10-CM | POA: Diagnosis not present

## 2019-10-17 ENCOUNTER — Encounter: Payer: Self-pay | Admitting: Family Medicine

## 2019-10-17 NOTE — Progress Notes (Signed)
No ICM remote transmission received for 10/08/2019 and next ICM transmission scheduled for 10/29/2019.

## 2019-10-23 ENCOUNTER — Ambulatory Visit: Payer: Medicare Other

## 2019-10-24 ENCOUNTER — Encounter: Payer: Self-pay | Admitting: *Deleted

## 2019-10-26 ENCOUNTER — Other Ambulatory Visit: Payer: Self-pay | Admitting: Family

## 2019-10-30 ENCOUNTER — Telehealth: Payer: Self-pay | Admitting: Gastroenterology

## 2019-10-30 ENCOUNTER — Other Ambulatory Visit: Payer: Self-pay

## 2019-10-30 ENCOUNTER — Ambulatory Visit (INDEPENDENT_AMBULATORY_CARE_PROVIDER_SITE_OTHER): Payer: Medicare Other

## 2019-10-30 ENCOUNTER — Telehealth: Payer: Self-pay

## 2019-10-30 DIAGNOSIS — Z1211 Encounter for screening for malignant neoplasm of colon: Secondary | ICD-10-CM

## 2019-10-30 DIAGNOSIS — Z952 Presence of prosthetic heart valve: Secondary | ICD-10-CM | POA: Diagnosis not present

## 2019-10-30 DIAGNOSIS — I5022 Chronic systolic (congestive) heart failure: Secondary | ICD-10-CM | POA: Diagnosis not present

## 2019-10-30 DIAGNOSIS — Z95810 Presence of automatic (implantable) cardiac defibrillator: Secondary | ICD-10-CM | POA: Diagnosis not present

## 2019-10-30 LAB — POCT INR
INR: 2.3 (ref 2.0–3.0)
PT: 27.1

## 2019-10-30 NOTE — Telephone Encounter (Signed)
Follow Up:     Vanessa Carlson is returning your call.

## 2019-10-30 NOTE — Telephone Encounter (Signed)
   [facility] Pre-operative Risk Assessment    Request for surgical clearance:  1. What type of surgery is being performed? COLONOSCOPY  2. When is this surgery scheduled? 11/22/19  What type of clearance is required (medical clearance vs. Pharmacy clearance to hold med vs. Both)? NOT LISTED 3. Are there any medications that need to be held prior to surgery and how long? NOT LISTED 4. Practice name and name of physician performing surgery? DR ANNA  5. What is your office phone number [phone]   7.   What is your office fax number [phone]  8.   Anesthesia type (None, local, MAC, general) ? not listed   Vanessa Carlson 10/30/2019, 12:11 PM  _________________________________________________________________   (provider comments below)

## 2019-10-30 NOTE — Telephone Encounter (Signed)
Hi Chayton Murata. Thanks for calling me back i was on the phone scheduling a patient when you called back. In regards to patient [id number] sent the cardiac clearance to Dr. Saunders Revel for Cardiac Clearance only. I've sent the blood thinner request to her PCP.   I have added message from Driscilla Grammes, [place] that we are needing cardiac clearance. I will send note to pre op team.

## 2019-10-30 NOTE — Telephone Encounter (Signed)
Gastroenterology Pre-Procedure Review  Request Date: Thursday 11/22/19 Requesting Physician: Dr. Vicente Males  PATIENT REVIEW QUESTIONS: The patient responded to the following health history questions as indicated:    1. Are you having any GI issues? no 2. Do you have a personal history of Polyps? no 3. Do you have a family history of Colon Cancer or Polyps? no 4. Diabetes Mellitus? no 5. Joint replacements in the past 12 months?no 6. Major health problems in the past 3 months?no 7. Any artificial heart valves, MVP, or defibrillator?yes (Defibrillator Cardiac Clearance Sent to Dr. Saunders Revel)    MEDICATIONS & ALLERGIES:    Patient reports the following regarding taking any anticoagulation/antiplatelet therapy:   Plavix, Coumadin, Eliquis, Xarelto, Lovenox, Pradaxa, Brilinta, or Effient? yes (Coumadin-Blood Thinner Request sent to Dr. Brita Romp) Aspirin? yes (81 mg daily)  Patient confirms/reports the following medications:  Current Outpatient Medications  Medication Sig Dispense Refill  . aspirin 81 MG chewable tablet Chew 81 mg by mouth daily.    [name] atorvastatin (LIPITOR) 80 MG tablet Take 1 tablet by mouth once daily 90 tablet 0  . cyclobenzaprine (FLEXERIL) 5 MG tablet Take 1 tablet (5 mg total) by mouth 3 (three) times daily as needed for muscle spasms. 30 tablet 1  . diclofenac sodium (VOLTAREN) 1 % GEL APPLY 2 GRAMS TOPICALLY 4 TIMES DAILY 100 g 1  . ENTRESTO 24-26 MG TAKE 1 TABLET BY MOUTH TWICE DAILY 180 tablet 3  . fluticasone (FLONASE) 50 MCG/ACT nasal spray Place 2 sprays into both nostrils as needed for allergies or rhinitis.    . furosemide (LASIX) 40 MG tablet Take 2 tablets (80 mg total) by mouth 2 (two) times daily. 360 tablet 2  . gabapentin (NEURONTIN) 300 MG capsule Take 1 capsule (300 mg total) by mouth 2 (two) times daily. 180 capsule 3  . Lidocaine 5 % CREA Apply 1 application topically every 6 (six) hours as needed. 113 g 3  . metoprolol succinate (TOPROL-XL) 50 MG 24 hr  tablet Take 1/2 tablet (25 mg) by mouth two times a day. Take with or immediately following a meal. 90 tablet 3  . nitroGLYCERIN (NITROSTAT) 0.3 MG SL tablet Place 1 tablet (0.3 mg total) under the tongue every 5 (five) minutes as needed for chest pain. 30 tablet 3  . spironolactone (ALDACTONE) 25 MG tablet Take 1 tablet by mouth once daily 90 tablet 0  . traMADol (ULTRAM) 50 MG tablet Take 1 tablet (50 mg total) by mouth every 6 (six) hours as needed. 30 tablet 5  . traZODone (DESYREL) 50 MG tablet Take 0.5-1 tablets (25-50 mg total) by mouth at bedtime as needed for sleep. 30 tablet 3  . warfarin (COUMADIN) 10 MG tablet Take 1 tablet (10 mg total) by mouth as directed. (Patient not taking: Reported on 10/15/2019) 30 tablet 3  . warfarin (COUMADIN) 7.5 MG tablet Take 1 tablet (7.5 mg total) by mouth daily. 90 tablet 3   No current facility-administered medications for this visit.    Patient confirms/reports the following allergies:  No Known Allergies  No orders of the defined types were placed in this encounter.   AUTHORIZATION INFORMATION Primary Insurance: 1D#: Group #:  Secondary Insurance: 1D#: Group #:  SCHEDULE INFORMATION: Date: 11/22/19 Time: Location:[facility]

## 2019-10-30 NOTE — Telephone Encounter (Signed)
I returned Vanessa Carlson, Vanessa Carlson call and lmtcb.       Vanessa Carlson M 12 minutes ago (2:36 PM)  SW   Follow Up:     [name] is returning your call.      [facility] [phone]  [name] 13 minutes ago (2:35 PM)  [name], [name] routed conversation to Vanessa Carlson, CMA 56 minutes ago (1:52 PM)  [name], [name] 56 minutes ago (1:52 PM)  TD   [name] from Heart care left vm regarding a request for medical clearance  Or cardiac clearance  She needs clarification  And they are not managing her rx cumin that is managed by pt PCP please call [name] and ask for Pre op team      Documentation

## 2019-10-30 NOTE — Telephone Encounter (Signed)
Left message for Dr. Georgeann Oppenheim office to call back as we are needing clarification in regards to clearance.

## 2019-10-30 NOTE — Telephone Encounter (Signed)
Please see open phone note for surgery clearance.

## 2019-10-30 NOTE — Telephone Encounter (Signed)
   Primary Cardiologist: [name], MD  Chart reviewed as part of pre-operative protocol coverage. Patient was contacted 10/30/2019 in reference to pre-operative risk assessment for pending surgery as outlined below.  Vanessa Carlson was last seen on 09/19/19 by Dr. Saunders Revel.  Since that day, Vanessa Carlson has done well from a cardiac standpoint. She has chronic DOE which is unchanged in recent months, as well as chronic stable non-exertional chest discomfort. These were noted at her visit with Dr. Saunders Revel 08/2019. She can easily complete 4 METs without anginal complaints.   Therefore, based on ACC/AHA guidelines, the patient would be at acceptable risk for the planned procedure without further cardiovascular testing.   Patient's PCP to weight in on recommendations for holding coumadin.  I will route this recommendation to the requesting party via Epic fax function and remove from pre-op pool.  Please call with questions.  Abigail Butts, PA-C 10/30/2019, 3:42 PM

## 2019-10-30 NOTE — Patient Instructions (Signed)
Continue 7.5mg  a day.  Recheck in two weeks.

## 2019-10-30 NOTE — Telephone Encounter (Signed)
Vanessa Carlson from Heart care left vm regarding a request for medical clearance  Or cardiac clearance  She needs clarification  And they are not managing her rx cumin that is managed by pt PCP please call Vanessa Carlson [phone] and ask for Pre op team

## 2019-10-30 NOTE — Telephone Encounter (Signed)
Pt left vm to schedule a colonoscopy

## 2019-10-31 ENCOUNTER — Other Ambulatory Visit: Payer: Self-pay | Admitting: Internal Medicine

## 2019-10-31 ENCOUNTER — Other Ambulatory Visit: Payer: Self-pay | Admitting: Family

## 2019-10-31 MED ORDER — SACUBITRIL-VALSARTAN 24-26 MG PO TABS: 1.0000 | ORAL_TABLET | Freq: Two times a day (BID) | ORAL | 3 refills | Status: DC

## 2019-10-31 MED ORDER — ENTRESTO 24-26 MG PO TABS: 1.0000 | ORAL_TABLET | Freq: Two times a day (BID) | ORAL | 0 refills | Status: DC

## 2019-10-31 NOTE — Telephone Encounter (Signed)
*  STAT* If patient is at the pharmacy, call can be transferred to refill team.   1. Which medications need to be refilled? (please list name of each medication and dose if known) Entresto 24-26 mg po q BID  2. Which pharmacy/location (including street and city if local pharmacy) is medication to be sent to? [place]   3. Do they need a 30 day or 90 day supply? [place]

## 2019-11-02 ENCOUNTER — Other Ambulatory Visit: Payer: Self-pay | Admitting: Family Medicine

## 2019-11-02 MED ORDER — ENOXAPARIN SODIUM 100 MG/ML ~~LOC~~ SOLN: 1.0000 mg/kg | Freq: Two times a day (BID) | SUBCUTANEOUS | 0 refills | Status: DC

## 2019-11-05 ENCOUNTER — Telehealth: Payer: Self-pay

## 2019-11-05 NOTE — Telephone Encounter (Signed)
Blood thinner request received from Dr. Brita Romp.  Pt advised to stop Coumadin 5 days prior to colonoscopy restart 1 day after procedure.  Also advised she will need a lovenox bridge and to contact Dr. Nancy Nordmann office to get instructions.  Thanks,  [place], [place]

## 2019-11-05 NOTE — Progress Notes (Signed)
EPIC Encounter for ICM Monitoring  Patient Name: Vanessa Carlson is a 59 y.o. female Date: 11/05/2019 Primary Care Physican: Virginia Crews, MD Primary Cardiologist: Carlis Abbott, NP HF Electrophysiologist: Caryl Comes Weight:unknown   Transmission reviewed.  Optivol thoracic impedancenormal.  Prescribed:Furosemide40 mg2tablets (80 mg total) twice a day.   Labs: 09/17/2019 Creatinine 0.89, BUN 13, Potassium 3.6, Sodium 140, GFR >60 03/01/2019 Creatinine0.98, BUN15, Potassium4.5, Sodium140, GFR>60 A complete set of results can be found in Results Review.  Recommendations: None  Follow-up plan: ICM clinic phone appointment on 12/06/2019.   91 day device clinic remote transmission 12/25/2019.     Copy of ICM check sent to Dr. Caryl Comes.   3 month ICM trend: 10/30/2019    1 Year ICM trend:       [name], RN 11/05/2019 12:10 PM

## 2019-11-08 ENCOUNTER — Encounter: Payer: Self-pay | Admitting: Family Medicine

## 2019-11-09 ENCOUNTER — Other Ambulatory Visit: Payer: Self-pay | Admitting: Family Medicine

## 2019-11-13 ENCOUNTER — Encounter: Payer: Medicare Other | Admitting: Family Medicine

## 2019-11-13 ENCOUNTER — Ambulatory Visit (INDEPENDENT_AMBULATORY_CARE_PROVIDER_SITE_OTHER): Payer: Medicare Other

## 2019-11-13 ENCOUNTER — Other Ambulatory Visit: Payer: Self-pay

## 2019-11-13 DIAGNOSIS — Z952 Presence of prosthetic heart valve: Secondary | ICD-10-CM

## 2019-11-13 LAB — POCT INR
INR: 2.6 (ref 2.0–3.0)
PT: 31.4

## 2019-11-13 NOTE — Patient Instructions (Signed)
Description   Continue back at 7.5mg  daily. Return in 3 weeks

## 2019-11-14 ENCOUNTER — Other Ambulatory Visit: Payer: Self-pay | Admitting: Family Medicine

## 2019-11-14 MED ORDER — ENOXAPARIN SODIUM 100 MG/ML ~~LOC~~ SOLN: 1.0000 mg/kg | Freq: Two times a day (BID) | SUBCUTANEOUS | 0 refills | Status: DC

## 2019-11-14 NOTE — Telephone Encounter (Signed)
Medication Refill - Medication: enoxaparin (LOVENOX) 100 MG/ML injection  Pt needs rx for lovenox. Pt is having colonoscopy on 11/22/19.  Has the patient contacted their pharmacy? No. (Agent: If no, request that the patient contact the pharmacy for the refill.) (Agent: If yes, when and what did the pharmacy advise?)  Preferred Pharmacy (with phone number or street name):  [facility] [place] Phone:  [phone]  Fax:  [phone]       Agent: Please be advised that RX refills may take up to 3 business days. We ask that you follow-up with your pharmacy.

## 2019-11-14 NOTE — Telephone Encounter (Signed)
Requested medication (s) are due for refill today: Yes  Requested medication (s) are on the active medication list: Yes  Last refill:  11/02/19  Future visit scheduled: No  Notes to clinic:  Pt. Asking for refill due to colonoscopy. Please advise.    Requested Prescriptions  Pending Prescriptions Disp Refills   enoxaparin (LOVENOX) 100 MG/ML injection 10 mL 0    Sig: Inject 1 mL (100 mg total) into the skin every 12 (twelve) hours for 5 days.      Hematology:  Anticoagulants Passed - 11/14/2019  9:34 AM      Passed - HGB in normal range and within 360 days    Hemoglobin  Date Value Ref Range Status  09/17/2019 12.8 12.0 - 15.0 g/dL Final  08/18/2018 12.3 11.1 - 15.9 g/dL Final          Passed - PLT in normal range and within 360 days    Platelets  Date Value Ref Range Status  09/17/2019 168 150 - 400 K/uL Final  08/18/2018 80 (LL) 150 - 450 x10E3/uL Final    Comment:    Actual platelet count may be somewhat higher than reported due to aggregation of platelets in this sample.           Passed - HCT in normal range and within 360 days    HCT  Date Value Ref Range Status  09/17/2019 41.0 36.0 - 46.0 % Final   Hematocrit  Date Value Ref Range Status  08/18/2018 38.0 34.0 - 46.6 % Final          Passed - Cr in normal range and within 360 days    Creat  Date Value Ref Range Status  07/08/2017 1.14 (H) 0.50 - 1.05 mg/dL Final    Comment:    For patients >43 years of age, the reference limit for Creatinine is approximately 13% higher for people identified as African-American. .    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[facility])   [place], [place]   11 months ago Constipation, unspecified constipation type   [facility]  [place], Wendee Beavers, [place]   1 year ago Essential hypertension   [place], Dionne Bucy, [place]   1 year ago Encounter for Medicare annual wellness exam   [facility] [place], Dionne Bucy, MD   1 year ago Status post mitral valve replacement   [facility] [place], Dionne Bucy, MD       Future Appointments             In 1 month [place], Harrell Gave, MD [facility]   In 4 months Sindy Gu[place], [facility]

## 2019-11-20 ENCOUNTER — Other Ambulatory Visit: Admission: RE | Admit: 2019-11-20 | Payer: Medicare Other | Source: Ambulatory Visit

## 2019-11-22 ENCOUNTER — Encounter: Admission: RE | Payer: Self-pay | Source: Ambulatory Visit

## 2019-11-22 ENCOUNTER — Ambulatory Visit: Admission: RE | Admit: 2019-11-22 | Payer: Medicare Other | Source: Ambulatory Visit | Admitting: Gastroenterology

## 2019-11-22 SURGERY — COLONOSCOPY WITH PROPOFOL
Anesthesia: General

## 2019-12-04 ENCOUNTER — Ambulatory Visit: Payer: Medicare Other

## 2019-12-06 ENCOUNTER — Ambulatory Visit (INDEPENDENT_AMBULATORY_CARE_PROVIDER_SITE_OTHER): Payer: Medicare Other

## 2019-12-06 DIAGNOSIS — Z95810 Presence of automatic (implantable) cardiac defibrillator: Secondary | ICD-10-CM | POA: Diagnosis not present

## 2019-12-06 DIAGNOSIS — I5022 Chronic systolic (congestive) heart failure: Secondary | ICD-10-CM | POA: Diagnosis not present

## 2019-12-07 ENCOUNTER — Other Ambulatory Visit: Payer: Self-pay

## 2019-12-07 ENCOUNTER — Ambulatory Visit (INDEPENDENT_AMBULATORY_CARE_PROVIDER_SITE_OTHER): Payer: Medicare Other | Admitting: Physician Assistant

## 2019-12-07 ENCOUNTER — Encounter: Payer: Self-pay | Admitting: Physician Assistant

## 2019-12-07 ENCOUNTER — Encounter: Payer: Self-pay | Admitting: Family Medicine

## 2019-12-07 VITALS — BP 121/81 | HR 66 | Temp 97.2°F | Resp 16 | Ht 62.5 in | Wt 221.0 lb

## 2019-12-07 DIAGNOSIS — M62838 Other muscle spasm: Secondary | ICD-10-CM | POA: Diagnosis not present

## 2019-12-07 MED ORDER — METHYLPREDNISOLONE 4 MG PO TBPK: ORAL_TABLET | ORAL | 0 refills | Status: DC

## 2019-12-07 MED ORDER — CYCLOBENZAPRINE HCL 5 MG PO TABS: 5.0000 mg | ORAL_TABLET | Freq: Three times a day (TID) | ORAL | 1 refills | Status: DC | PRN

## 2019-12-07 NOTE — Progress Notes (Signed)
EPIC Encounter for ICM Monitoring  Patient Name: Dim Meisinger is a 59 y.o. female Date: 12/07/2019 Primary Care Physican: Virginia Crews, MD Primary Cardiologist: Carlis Abbott, NP HF Electrophysiologist: Caryl Comes Weight:unknown   Transmission reviewed.  Optivol thoracic impedancenormal.  Prescribed:Furosemide40 mg2tablets (80 mg total) twice a day.   Labs: 09/17/2019 Creatinine 0.89, BUN 13, Potassium 3.6, Sodium 140, GFR >60 03/01/2019 Creatinine0.98, BUN15, Potassium4.5, Sodium140, GFR>60 A complete set of results can be found in Results Review.  Recommendations:None  Follow-up plan: ICM clinic phone appointment on 01/07/2020. 91 day device clinic remote transmission 12/25/2019.   Copy of ICM check sent to [place].   3 month ICM trend: 12/06/2019    1 Year ICM trend:       [name], RN 12/07/2019 12:47 PM

## 2019-12-07 NOTE — Progress Notes (Signed)
Established patient visit      Patient: Vanessa Carlson   DOB: 1961/01/20   59 y.o. Female  MRN: [id number] Visit Date: 12/07/2019  Today's healthcare provider: Mar Daring, PA-C  Subjective:    Chief Complaint  Patient presents with  . Neck Pain   HPI Patient here with c/o having severe pains in her neck and shoulder. Reports that it feels like someone is stabbing her. It gets so bad that she break out in sweats sometimes, and than it stops. Its been 2 days. She have used the over the counter creams, med cream Dr. B prescribed that eases the pain some. She said her neck is numb, feels like someone pinching her nerve. She takes Flexeril every day.   Patient Active Problem List   Diagnosis Date Noted  . Axillary pain, right 09/08/2018  . Myocardiopathy ([place]) 08/18/2018  . Valvular heart disease 08/18/2018  . Lung cancer ([place]) 05/31/2018  . Hyperlipidemia LDL goal <70 03/22/2018  . Morbid obesity ([place]) 12/22/2017  . Insomnia 12/07/2017  . Mass of upper lobe of right lung 09/06/2017  . Chest wall pain 08/31/2017  . Right shoulder pain 07/12/2017  . OA (osteoarthritis) of knee 07/08/2017  . Chronic anticoagulation 07/08/2017  . Paroxysmal atrial fibrillation ([facility])   . Coronary artery disease 02/24/2017  . Ischemic cardiomyopathy 02/24/2017  . Rheumatic heart disease 02/24/2017  . Status post mitral valve replacement 02/24/2017  . Hypokalemia 01/28/2017  . Chronic systolic heart failure ([place]) 01/02/2017  . HTN (hypertension) 01/02/2017   Past Medical History:  Diagnosis Date  . AICD (automatic cardioverter/defibrillator) present   . Anterior myocardial infarction [facility])    a. 10/2014 - occluded LAD, complicated by cardiogenic shock-->Med Rx as interventional team was unable to open LAD.  [name] Cancer of upper lobe of right lung ([place]) 09/2017   radiation therapy right side  . Cervical cancer ([place])    in remission for ~20 yrs, s/p hysterectomy  . Chronic combined  systolic and diastolic CHF (congestive heart failure) ([place])    a. 10/2015 Echo: EF 20-25%, sev diast dysfxn, mildly reduced RV fxn. Nl MV prosthesis; b. 12/2017 Echo: EF 20-25%, diff HK. Sev ant/antsept HK, apical AK. Mild MS (mean graad 92mmHg). Mod TR. PASP 58mmHg.  [name] Coronary artery disease    a. 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  . Essential hypertension   . Ischemic cardiomyopathy    a. 03/2015 s/p MDT single lead AICD;  b. 10/2015 Echo: EF 20-25%;  c. 12/2017 Echo: EF 20-25%, diff HK.  . Mass of upper lobe of right lung    a. noted on CXR & CT 08/2017 w/ abnl PET CT.  [name] Mitral valve disease    a. 2006 s/p MVR w/ 30mm SJM bileaflet mechanical valve-->chronic coumadin/ASA; b. 12/2017 Echo: Mild MS. Mean gradient 61mmHg.  [name] Personal history of radiation therapy    f/u lung ca       Medications: Outpatient Medications Prior to Visit  Medication Sig  . aspirin 81 MG chewable tablet Chew 81 mg by mouth daily.  [name] atorvastatin (LIPITOR) 80 MG tablet Take 1 tablet by mouth once daily  . cyclobenzaprine (FLEXERIL) 5 MG tablet Take 1 tablet (5 mg total) by mouth 3 (three) times daily as needed for muscle spasms.  . diclofenac sodium (VOLTAREN) 1 % GEL APPLY 2 GRAMS TOPICALLY 4 TIMES DAILY  . fluticasone (FLONASE) 50  MCG/ACT nasal spray Place 2 sprays into both nostrils as needed for allergies or rhinitis.  . furosemide (LASIX) 40 MG tablet Take 2 tablets (80 mg total) by mouth 2 (two) times daily.  [name] gabapentin (NEURONTIN) 300 MG capsule Take 1 capsule (300 mg total) by mouth 2 (two) times daily.  . Lidocaine 5 % CREA Apply 1 application topically every 6 (six) hours as needed.  . metoprolol succinate (TOPROL-XL) 50 MG 24 hr tablet Take 1/2 tablet (25 mg) by mouth two times a day. Take with or immediately following a meal.  . nitroGLYCERIN (NITROSTAT) 0.3 MG SL tablet Place 1 tablet (0.3 mg total) under the  tongue every 5 (five) minutes as needed for chest pain.  . sacubitril-valsartan (ENTRESTO) 24-26 MG Take 1 tablet by mouth 2 (two) times daily.  [name] spironolactone (ALDACTONE) 25 MG tablet Take 1 tablet by mouth once daily  . traMADol (ULTRAM) 50 MG tablet Take 1 tablet (50 mg total) by mouth every 6 (six) hours as needed.  . traZODone (DESYREL) 50 MG tablet Take 0.5-1 tablets (25-50 mg total) by mouth at bedtime as needed for sleep.  [name] warfarin (COUMADIN) 7.5 MG tablet Take 1 tablet (7.5 mg total) by mouth daily.  [name] enoxaparin (LOVENOX) 100 MG/ML injection Inject 1 mL (100 mg total) into the skin every 12 (twelve) hours for 5 days.  [name] warfarin (COUMADIN) 10 MG tablet Take 1 tablet (10 mg total) by mouth as directed. (Patient not taking: Reported on 10/15/2019)   No facility-administered medications prior to visit.    Review of Systems  Constitutional: Negative.   Respiratory: Negative.   Cardiovascular: Negative.   Musculoskeletal: Positive for arthralgias, myalgias, neck pain and neck stiffness.  Neurological: Negative for weakness and numbness.    Last CBC Lab Results  Component Value Date   WBC 4.6 09/17/2019   HGB 12.8 09/17/2019   HCT 41.0 09/17/2019   MCV 90.3 09/17/2019   MCH 28.2 09/17/2019   RDW 14.6 09/17/2019   PLT 168 71/24/5809   Last metabolic panel Lab Results  Component Value Date   GLUCOSE 145 (H) 09/17/2019   NA 140 09/17/2019   K 3.6 09/17/2019   CL 104 09/17/2019   CO2 28 09/17/2019   BUN 13 09/17/2019   CREATININE 0.89 09/17/2019   GFRNONAA >60 09/17/2019   GFRAA >60 09/17/2019   CALCIUM 9.1 09/17/2019   PROT 7.9 09/17/2019   ALBUMIN 4.2 09/17/2019   LABGLOB 2.8 08/18/2018   AGRATIO 1.5 08/18/2018   BILITOT 1.1 09/17/2019   ALKPHOS 70 09/17/2019   AST 21 09/17/2019   ALT 15 09/17/2019   ANIONGAP 8 09/17/2019   Last hemoglobin A1c No results found for: HGBA1C Last thyroid functions No results found for: TSH, T3TOTAL, T4TOTAL, THYROIDAB        Objective:    BP 121/81 (BP Location: Right Arm, Patient Position: Sitting, Cuff Size: Large)   Pulse 66   Temp (!) 97.2 F (36.2 C) (Temporal)   Resp 16   Ht 5' 2.5" (1.588 m)   Wt 221 lb (100.2 kg)   LMP  (LMP Unknown)   BMI 39.78 kg/m  BP Readings from Last 3 Encounters:  12/07/19 121/81  09/20/19 112/63  09/19/19 110/60   Wt Readings from Last 3 Encounters:  12/07/19 221 lb (100.2 kg)  09/20/19 225 lb 9.6 oz (102.3 kg)  09/19/19 224 lb 8 oz (101.8 kg)      Physical Exam Vitals reviewed.  Constitutional:  General: She is not in acute distress.    Appearance: Normal appearance. She is well-developed. She is obese. She is not ill-appearing or diaphoretic.  Neck:     Thyroid: No thyromegaly.     Vascular: No JVD.     Trachea: No tracheal deviation.   Cardiovascular:     Rate and Rhythm: Normal rate and regular rhythm.     Pulses: Normal pulses.     Heart sounds: Normal heart sounds. No murmur. No friction rub. No gallop.   Pulmonary:     Effort: Pulmonary effort is normal. No respiratory distress.     Breath sounds: Normal breath sounds. No wheezing or rales.  Musculoskeletal:     Cervical back: Neck supple. Pain with movement and muscular tenderness present. No spinous process tenderness. Decreased range of motion.  Lymphadenopathy:     Cervical: No cervical adenopathy.  Neurological:     Mental Status: She is alert.       No results found for any visits on 12/07/19.    Assessment & Plan:    1. Muscle spasm Worsening despite OTC treatments. Will start flexeril and medrol as below. Continue heat and massages. Call if not improving or worsening and will get imaging.  - cyclobenzaprine (FLEXERIL) 5 MG tablet; Take 1 tablet (5 mg total) by mouth 3 (three) times daily as needed for muscle spasms.  Dispense: 30 tablet; Refill: 1 - methylPREDNISolone (MEDROL) 4 MG TBPK tablet; 6 day taper; take as directed on package instructions  Dispense: 21 tablet; Refill:  0      [place]  [facility] [phone] (phone) [phone] (fax)  [place]

## 2019-12-07 NOTE — Patient Instructions (Signed)
Acute Torticollis, Adult Torticollis is a condition in which the muscles of the neck tighten (contract) abnormally, causing the neck to twist and the head to move into an unnatural position. Torticollis that develops suddenly is called acute torticollis. People with acute torticollis may have trouble turning their head. The condition can be painful and may range from mild to severe. What are the causes? This condition may be caused by:  Sleeping in an awkward position (common).  Extending or twisting the neck muscles beyond their normal position.  An injury to the neck muscles.  An infection.  A tumor.  Certain medicines.  Long-lasting spasms of the neck muscles. In some cases, the cause may not be known. What increases the risk? You are more likely to develop this condition if:  You have a condition associated with loose ligaments, such as Down syndrome.  You have a brain condition that affects vision, such as strabismus. What are the signs or symptoms? The main symptom of this condition is tilting of the head to one side. Other symptoms include:  Pain in the neck.  Trouble turning the head from side to side or up and down. How is this diagnosed? This condition may be diagnosed based on:  A physical exam.  Your medical history.  Imaging tests, such as: ? An X-ray. ? An ultrasound. ? A CT scan. ? An MRI. How is this treated? Treatment for this condition depends on what is causing the condition. Mild cases may go away without treatment. Treatment for more serious cases may include:  Medicines or shots to relax the muscles.  Other medicines, such as antibiotics to treat the underlying cause.  Wearing a soft neck collar.  Physical therapy and stretching to improve neck strength and flexibility.  Neck massage. In severe cases, surgery may be needed to repair dislocated or broken bones or to treat nerves in the neck. Follow these instructions at home:   Take  over-the-counter and prescription medicines only as told by your health care provider.  Do stretching exercises and massage your neck as told by your health care provider.  If directed, apply heat to the affected area as often as told by your health care provider. Use the heat source that your health care provider recommends, such as a moist heat pack or a heating pad. ? Place a towel between your skin and the heat source. ? Leave the heat on for 20-30 minutes. ? Remove the heat if your skin turns bright red. This is especially important if you are unable to feel pain, heat, or cold. You may have a greater risk of getting burned.  If you wake up with torticollis after sleeping, check your bed or sleeping area. Look for lumpy pillows or unusual objects. Make sure your bed and sleeping area are comfortable.  Keep all follow-up visits as told by your health care provider. This is important. Contact a health care provider if:  You have a fever.  Your symptoms do not improve or they get worse. Get help right away if:  You have trouble breathing.  You develop noisy breathing (stridor).  You start to drool.  You have trouble swallowing or pain when swallowing.  You develop numbness or weakness in your hands or feet.  You have changes in your speech, understanding, or vision.  You are in severe pain.  You cannot move your head or neck. Summary  Torticollis is a condition in which the muscles of the neck tighten (contract) abnormally, causing   the neck to twist and the head to move into an unnatural position. Torticollis that develops suddenly is called acute torticollis.  Treatment for this condition depends on what is causing the condition. Mild cases may go away without treatment.  Do stretching exercises and massage your neck as told by your health care provider. You may also be instructed to apply heat to the area.  Contact your health care provider if your symptoms do not  improve or they get worse. This information is not intended to replace advice given to you by your health care provider. Make sure you discuss any questions you have with your health care provider. Document Revised: 07/29/2017 Document Reviewed: 10/14/2016 Elsevier Patient Education  2020 [employer]  

## 2019-12-13 ENCOUNTER — Encounter: Payer: Medicare Other | Admitting: Family Medicine

## 2019-12-13 NOTE — Progress Notes (Signed)
Complete physical exam     Patient: Vanessa Carlson   DOB: 1961/06/03   59 y.o. Female  MRN: [id number] Visit Date: 12/14/2019  I,Sulibeya S Dimas,acting as a scribe for Lavon Paganini, MD.,have documented all relevant documentation on the behalf of Lavon Paganini, MD,as directed by  Lavon Paganini, MD while in the presence of Lavon Paganini, MD.  Today's healthcare provider: Lavon Paganini, MD  Subjective:    Chief Complaint  Patient presents with  . Medicare Wellness  . Neck Pain  . Anticoagulation    Vanessa Carlson is a 59 y.o. female who presents today for a complete physical exam.  She reports consuming a general diet. The patient does not participate in regular exercise at present. She generally feels well. She reports sleeping well. She does have additional problems to discuss today.   HPI  Patient C/O recurrent and persistent neck pain x's several week. Patient was seen a week ago and was started on prednisone taper. Patient reports medications have not helped with pain.  03/08/2018 Pap/HPV-negative 09/12/2019 Mammogram-BI-RADS 1  -Patient Due For Colonoscopy   Past Medical History:  Diagnosis Date  . AICD (automatic cardioverter/defibrillator) present   . Anterior myocardial infarction [facility])    a. 10/2014 - occluded LAD, complicated by cardiogenic shock-->Med Rx as interventional team was unable to open LAD.  [name] Cancer of upper lobe of right lung ([place]) 09/2017   radiation therapy right side  . Cervical cancer ([place])    in remission for ~20 yrs, s/p hysterectomy  . Chronic combined systolic and diastolic CHF (congestive heart failure) ([place])    a. 10/2015 Echo: EF 20-25%, sev diast dysfxn, mildly reduced RV fxn. Nl MV prosthesis; b. 12/2017 Echo: EF 20-25%, diff HK. Sev ant/antsept HK, apical AK. Mild MS (mean graad 34mmHg). Mod TR. PASP 44mmHg.  [name] Coronary artery disease    a. 10/2014 Ant STEMI/Cath: LAD 100p ->Med managed as lesion could not be  crossed-->complicated by CGS and post-MI pericarditis ([facility]).  . Essential hypertension   . Ischemic cardiomyopathy    a. 03/2015 s/p MDT single lead AICD;  b. 10/2015 Echo: EF 20-25%;  c. 12/2017 Echo: EF 20-25%, diff HK.  . Mass of upper lobe of right lung    a. noted on CXR & CT 08/2017 w/ abnl PET CT.  [name] Mitral valve disease    a. 2006 s/p MVR w/ 63mm SJM bileaflet mechanical valve-->chronic coumadin/ASA; b. 12/2017 Echo: Mild MS. Mean gradient 39mmHg.  [name] Personal history of radiation therapy    f/u lung ca   Past Surgical History:  Procedure Laterality Date  . APPENDECTOMY  1998  . BREAST EXCISIONAL BIOPSY Left 80s   benign  . CARDIAC CATHETERIZATION    . CARDIAC DEFIBRILLATOR PLACEMENT    . RADICAL HYSTERECTOMY  1998  . SHOULDER SURGERY Bilateral    rotator cuff tears  . VALVE REPLACEMENT     Mitral valve; 27 mm St. Jude bileaflet valve   Social History   Socioeconomic History  . Marital status: Married    Spouse name: Tosin  . Number of children: 2  . Years of education: 30  . Highest education level: Some college, no degree  Occupational History    Employer: [employer]  Tobacco Use  . Smoking status: Former Smoker    Packs/day: 1.00    Years: 4.00    Pack years: 4.00    Types: Cigarettes    Quit date: 1998    Years since  quitting: 23.3  . Smokeless tobacco: Never Used  Substance and Sexual Activity  . Alcohol use: No  . Drug use: No  . Sexual activity: Yes    Partners: Male    Birth control/protection: Surgical  Other Topics Concern  . Not on file  Social History Narrative  . Not on file   Social Determinants of Health   Financial Resource Strain: Low Risk   . Difficulty of Paying Living Expenses: Not hard at all  Food Insecurity: No Food Insecurity  . Worried About Charity fundraiser in the Last Year: Never true  . Ran Out of Food in the Last Year: Never true  Transportation Needs: No Transportation Needs  . Lack of Transportation (Medical): No   . Lack of Transportation (Non-Medical): No  Physical Activity: Inactive  . Days of Exercise per Week: 0 days  . Minutes of Exercise per Session: 0 min  Stress: No Stress Concern Present  . Feeling of Stress : Not at all  Social Connections: Slightly Isolated  . Frequency of Communication with Friends and Family: More than three times a week  . Frequency of Social Gatherings with Friends and Family: Twice a week  . Attends Religious Services: More than 4 times per year  . Active Member of Clubs or Organizations: No  . Attends Archivist Meetings: Never  . Marital Status: Married  Human resources officer Violence: Not At Risk  . Fear of Current or Ex-Partner: No  . Emotionally Abused: No  . Physically Abused: No  . Sexually Abused: No   Family Status  Relation Name Status  . Mother  Deceased  . Father  Deceased  . MGM  (Not Specified)  . Mat Aunt  (Not Specified)  . Mat Aunt  (Not Specified)  . Mat Uncle  (Not Specified)   Family History  Problem Relation Age of Onset  . Heart attack Mother   . Hypertension Mother   . Diabetes Mother   . Emphysema Father        smoker  . Diabetes Maternal Grandmother   . Kidney disease Maternal Grandmother   . Breast cancer Maternal Aunt   . Breast cancer Maternal Aunt   . Colon cancer Maternal Uncle    No Known Allergies  Patient Care Team: Virginia Crews, MD as PCP - General (Family Medicine) End, Harrell Gave, MD as PCP - Cardiology (Cardiology) Nestor Lewandowsky, MD as Referring Physician (Cardiothoracic Surgery)   Medications: Outpatient Medications Prior to Visit  Medication Sig  . aspirin 81 MG chewable tablet Chew 81 mg by mouth daily.  [name] atorvastatin (LIPITOR) 80 MG tablet Take 1 tablet by mouth once daily  . cyclobenzaprine (FLEXERIL) 5 MG tablet Take 1 tablet (5 mg total) by mouth 3 (three) times daily as needed for muscle spasms.  . diclofenac sodium (VOLTAREN) 1 % GEL APPLY 2 GRAMS TOPICALLY 4 TIMES DAILY  .  fluticasone (FLONASE) 50 MCG/ACT nasal spray Place 2 sprays into both nostrils as needed for allergies or rhinitis.  . furosemide (LASIX) 40 MG tablet Take 2 tablets (80 mg total) by mouth 2 (two) times daily.  [name] gabapentin (NEURONTIN) 300 MG capsule Take 1 capsule (300 mg total) by mouth 2 (two) times daily.  . Lidocaine 5 % CREA Apply 1 application topically every 6 (six) hours as needed.  . metoprolol succinate (TOPROL-XL) 50 MG 24 hr tablet Take 1/2 tablet (25 mg) by mouth two times a day. Take with or immediately following a meal.  .  nitroGLYCERIN (NITROSTAT) 0.3 MG SL tablet Place 1 tablet (0.3 mg total) under the tongue every 5 (five) minutes as needed for chest pain.  . sacubitril-valsartan (ENTRESTO) 24-26 MG Take 1 tablet by mouth 2 (two) times daily.  [name] spironolactone (ALDACTONE) 25 MG tablet Take 1 tablet by mouth once daily  . traMADol (ULTRAM) 50 MG tablet Take 1 tablet (50 mg total) by mouth every 6 (six) hours as needed.  . traZODone (DESYREL) 50 MG tablet Take 0.5-1 tablets (25-50 mg total) by mouth at bedtime as needed for sleep.  [name] warfarin (COUMADIN) 7.5 MG tablet Take 1 tablet (7.5 mg total) by mouth daily.  [name] enoxaparin (LOVENOX) 100 MG/ML injection Inject 1 mL (100 mg total) into the skin every 12 (twelve) hours for 5 days.  . methylPREDNISolone (MEDROL) 4 MG TBPK tablet 6 day taper; take as directed on package instructions (Patient not taking: Reported on 12/14/2019)  . warfarin (COUMADIN) 10 MG tablet Take 1 tablet (10 mg total) by mouth as directed. (Patient not taking: Reported on 10/15/2019)   No facility-administered medications prior to visit.    Review of Systems  Constitutional: Negative.   HENT: Negative.   Eyes: Negative.   Respiratory: Positive for shortness of breath.   Cardiovascular: Positive for palpitations.  Gastrointestinal: Negative.   Endocrine: Negative.   Genitourinary: Negative.   Musculoskeletal: Positive for arthralgias and neck pain.  Skin:  Negative.   Allergic/Immunologic: Negative.   Neurological: Negative.   Hematological: Negative.   Psychiatric/Behavioral: Negative.     Last CBC Lab Results  Component Value Date   WBC 4.6 09/17/2019   HGB 12.8 09/17/2019   HCT 41.0 09/17/2019   MCV 90.3 09/17/2019   MCH 28.2 09/17/2019   RDW 14.6 09/17/2019   PLT 168 83/38/2505   Last metabolic panel Lab Results  Component Value Date   GLUCOSE 145 (H) 09/17/2019   NA 140 09/17/2019   K 3.6 09/17/2019   CL 104 09/17/2019   CO2 28 09/17/2019   BUN 13 09/17/2019   CREATININE 0.89 09/17/2019   GFRNONAA >60 09/17/2019   GFRAA >60 09/17/2019   CALCIUM 9.1 09/17/2019   PROT 7.9 09/17/2019   ALBUMIN 4.2 09/17/2019   LABGLOB 2.8 08/18/2018   AGRATIO 1.5 08/18/2018   BILITOT 1.1 09/17/2019   ALKPHOS 70 09/17/2019   AST 21 09/17/2019   ALT 15 09/17/2019   ANIONGAP 8 09/17/2019   Last lipids Lab Results  Component Value Date   CHOL 117 08/18/2018   HDL 40 08/18/2018   LDLCALC 58 08/18/2018   TRIG 96 08/18/2018   CHOLHDL 2.9 08/18/2018      Objective:      BP 104/69 (BP Location: Left Arm, Patient Position: Sitting, Cuff Size: Large)   Pulse 69   Temp (!) 97.1 F (36.2 C) (Temporal)   Resp 16   Ht 5\' 2"  (1.575 m)   Wt 220 lb (99.8 kg)   LMP  (LMP Unknown)   BMI 40.24 kg/m  BP Readings from Last 3 Encounters:  12/14/19 104/69  12/07/19 121/81  09/20/19 112/63   Wt Readings from Last 3 Encounters:  12/14/19 220 lb (99.8 kg)  12/07/19 221 lb (100.2 kg)  09/20/19 225 lb 9.6 oz (102.3 kg)      Physical Exam Vitals reviewed.  Constitutional:      General: She is not in acute distress.    Appearance: Normal appearance. She is well-developed. She is not diaphoretic.  HENT:     Head:  Normocephalic and atraumatic.     Right Ear: Tympanic membrane, ear canal and external ear normal.     Left Ear: Tympanic membrane, ear canal and external ear normal.  Eyes:     General: No scleral icterus.     Conjunctiva/sclera: Conjunctivae normal.     Pupils: Pupils are equal, round, and reactive to light.  Neck:     Thyroid: No thyromegaly.  Cardiovascular:     Rate and Rhythm: Normal rate and regular rhythm.     Pulses: Normal pulses.     Heart sounds: Murmur present.  Pulmonary:     Effort: Pulmonary effort is normal. No respiratory distress.     Breath sounds: Normal breath sounds. No wheezing or rales.  Abdominal:     General: There is no distension.     Palpations: Abdomen is soft.     Tenderness: There is no abdominal tenderness. There is no guarding or rebound.  Musculoskeletal:        General: No deformity.     Cervical back: Neck supple.     Right lower leg: No edema.     Left lower leg: No edema.     Comments: Trapezius muscle spasms bilateral L>R  Lymphadenopathy:     Cervical: No cervical adenopathy.  Skin:    General: Skin is warm and dry.     Findings: No rash.  Neurological:     Mental Status: She is alert and oriented to person, place, and time. Mental status is at baseline.  Psychiatric:        Mood and Affect: Mood normal.        Behavior: Behavior normal.        Thought Content: Thought content normal.     Depression Screen  PHQ 2/9 Scores 12/14/2019 10/15/2019 05/31/2018  PHQ - 2 Score 1 0 0  PHQ- 9 Score 1 - -   6CIT Screen 12/14/2019 10/15/2019 03/08/2018  What Year? 0 points 0 points 0 points  What month? 0 points 0 points 0 points  What time? 0 points 0 points 0 points  Count back from 20 0 points 0 points 0 points  Months in reverse 0 points 2 points 0 points  Repeat phrase 2 points 0 points 2 points  Total Score 2 2 2     Audit-C Alcohol Use Screening   Alcohol Use Disorder Test (AUDIT) 03/08/2018 10/15/2019 12/14/2019  1. How often do you have a drink containing alcohol? 0 0 0  2. How many drinks containing alcohol do you have on a typical day when you are drinking? 0 0 0  3. How often do you have six or more drinks on one occasion? 0 0 0    AUDIT-C Score 0 0 0  Alcohol Brief Interventions/Follow-up - - AUDIT Score <7 follow-up not indicated    A score of 3 or more in women, and 4 or more in men indicates increased risk for alcohol abuse, EXCEPT if all of the points are from question 1 Functional Status Survey: Is the patient deaf or have difficulty hearing?: No Does the patient have difficulty seeing, even when wearing glasses/contacts?: No Does the patient have difficulty concentrating, remembering, or making decisions?: No Does the patient have difficulty walking or climbing stairs?: Yes Does the patient have difficulty dressing or bathing?: No Does the patient have difficulty doing errands alone such as visiting a doctor's office or shopping?: No   Results for orders placed or performed in visit on 12/14/19  POCT  INR  Result Value Ref Range   INR 1.6 (A) 2.0 - 3.0   PT        Assessment & Plan:    Routine Health Maintenance and Physical Exam  Exercise Activities and Dietary recommendations Goals    . LIFESTYLE - DECREASE FALLS RISK     Recommend to remove any items from the home that may cause slips or trips.       Immunization History  Administered Date(s) Administered  . Influenza Split 05/17/2016  . Influenza,inj,Quad PF,6+ Mos 06/20/2015, 07/08/2017, 09/08/2018  . Pneumococcal Polysaccharide-23 12/03/2008    Health Maintenance  Topic Date Due  . HIV Screening  Never done  . COLONOSCOPY  Never done  . TETANUS/TDAP  09/09/2023 (Originally 12/30/1978)  . INFLUENZA VACCINE  03/30/2020  . PAP SMEAR-Modifier  03/08/2021  . MAMMOGRAM  09/11/2021  . Hepatitis C Screening  Completed    Discussed health benefits of physical activity, and encouraged her to engage in regular exercise appropriate for her age and condition.  Problem List Items Addressed This Visit      Cardiovascular and Mediastinum   Chronic systolic heart failure (HCC) (Chronic)    Followed by Cardiology and HF clinic Continue  current meds euvolemic      HTN (hypertension) (Chronic)    Well controlled Continue current medications Reviewed recent metabolic panel F/u in 6 months      Coronary artery disease    Medically managed Well controlled with no angina Followed by Cardiology Continue current meds      Paroxysmal atrial fibrillation (HCC)    Asymptomatic INR 1.6 today. Warfarin 7.5 mg daily except 10 mg on Friday. 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[facility])    Chronic and stable Continue gabapentin        Musculoskeletal and Integument   Neck muscle spasm    No imporvement with medrol dose pack Continue muscle relaxers prn Referral to PT      Relevant Orders   Ambulatory referral to Physical Therapy     Other   Status post mitral valve replacement    Continues to swing from supratherapeutic to subtherapeutic INR Reports good compliance and minimal Vit K intake New dose Coumadin 7.5 mg daily except 10mg  on Fridays Goal INR 2.5-3.5 Recheck in 2 wks      Morbid obesity ([place])    Discussed importance of healthy weight management Discussed diet and exercise       Relevant Orders   Hemoglobin A1c   Hyperlipidemia LDL goal <70    Previously well controlled Continue Atorvastatin 80 mg daily Repeat lipid panel Discussed diet and exercise      Relevant Orders   Lipid Panel With LDL/HDL Ratio   Hemoglobin A1c    Other Visit Diagnoses    Encounter for annual wellness visit (AWV) in Medicare patient    -  Primary   Annual physical exam       Relevant Orders   Hemoglobin A1c   Encounter for screening for HIV       Relevant Orders   HIV Antibody (routine testing w rflx)       Return in about 6 months (around 06/14/2020) for chronic disease f/u.     I, Lavon Paganini, MD, have reviewed all documentation for this visit. The documentation on 12/14/19 for the exam, diagnosis, procedures, and orders are all accurate and  complete.   Tristyn Demarest,  Dionne Bucy, MD, MPH [facility]

## 2019-12-14 ENCOUNTER — Other Ambulatory Visit: Payer: Self-pay

## 2019-12-14 ENCOUNTER — Ambulatory Visit (INDEPENDENT_AMBULATORY_CARE_PROVIDER_SITE_OTHER): Payer: Medicare Other | Admitting: Family Medicine

## 2019-12-14 ENCOUNTER — Encounter: Payer: Self-pay | Admitting: Family Medicine

## 2019-12-14 VITALS — BP 104/69 | HR 69 | Temp 97.1°F | Resp 16 | Ht 62.0 in | Wt 220.0 lb

## 2019-12-14 DIAGNOSIS — M62838 Other muscle spasm: Secondary | ICD-10-CM | POA: Diagnosis not present

## 2019-12-14 DIAGNOSIS — Z0000 Encounter for general adult medical examination without abnormal findings: Secondary | ICD-10-CM

## 2019-12-14 DIAGNOSIS — I480 Paroxysmal atrial fibrillation: Secondary | ICD-10-CM

## 2019-12-14 DIAGNOSIS — E785 Hyperlipidemia, unspecified: Secondary | ICD-10-CM

## 2019-12-14 DIAGNOSIS — I10 Essential (primary) hypertension: Secondary | ICD-10-CM

## 2019-12-14 DIAGNOSIS — G6282 Radiation-induced polyneuropathy: Secondary | ICD-10-CM

## 2019-12-14 DIAGNOSIS — I25118 Atherosclerotic heart disease of native coronary artery with other forms of angina pectoris: Secondary | ICD-10-CM

## 2019-12-14 DIAGNOSIS — Z952 Presence of prosthetic heart valve: Secondary | ICD-10-CM | POA: Diagnosis not present

## 2019-12-14 DIAGNOSIS — Z114 Encounter for screening for human immunodeficiency virus [HIV]: Secondary | ICD-10-CM

## 2019-12-14 DIAGNOSIS — I5022 Chronic systolic (congestive) heart failure: Secondary | ICD-10-CM

## 2019-12-14 DIAGNOSIS — R739 Hyperglycemia, unspecified: Secondary | ICD-10-CM | POA: Diagnosis not present

## 2019-12-14 LAB — POCT INR: INR: 1.6 — AB (ref 2.0–3.0)

## 2019-12-14 NOTE — Assessment & Plan Note (Signed)
Medically managed Well controlled with no angina Followed by Cardiology Continue current meds

## 2019-12-14 NOTE — Assessment & Plan Note (Addendum)
Previously well controlled Continue Atorvastatin 80 mg daily Repeat lipid panel Discussed diet and exercise

## 2019-12-14 NOTE — Assessment & Plan Note (Signed)
No imporvement with medrol dose pack Continue muscle relaxers prn Referral to PT

## 2019-12-14 NOTE — Assessment & Plan Note (Signed)
Discussed importance of healthy weight management Discussed diet and exercise  

## 2019-12-14 NOTE — Assessment & Plan Note (Signed)
Asymptomatic INR 1.6 today. Warfarin 7.5 mg daily except 10 mg on Friday. Follow up in 2 week

## 2019-12-14 NOTE — Assessment & Plan Note (Addendum)
Well controlled Continue current medications Reviewed recent metabolic panel F/u in 6 months  

## 2019-12-14 NOTE — Assessment & Plan Note (Signed)
Continues to swing from supratherapeutic to subtherapeutic INR Reports good compliance and minimal Vit K intake New dose Coumadin 7.5 mg daily except 10mg  on Fridays Goal INR 2.5-3.5 Recheck in 2 wks

## 2019-12-14 NOTE — Patient Instructions (Signed)

## 2019-12-14 NOTE — Assessment & Plan Note (Signed)
Followed by Cardiology and HF clinic Continue current meds euvolemic

## 2019-12-14 NOTE — Assessment & Plan Note (Signed)
Chronic and stable Continue gabapentin

## 2019-12-15 ENCOUNTER — Other Ambulatory Visit: Payer: Self-pay | Admitting: Family Medicine

## 2019-12-15 LAB — LIPID PANEL WITH LDL/HDL RATIO
Cholesterol, Total: 126 mg/dL (ref 100–199)
HDL: 48 mg/dL (ref >39)
LDL Chol Calc (NIH): 61 mg/dL (ref 0–99)
LDL/HDL Ratio: 1.3 ratio (ref 0.0–3.2)
Triglycerides: 87 mg/dL (ref 0–149)
VLDL Cholesterol Cal: 17 mg/dL (ref 5–40)

## 2019-12-15 LAB — HEMOGLOBIN A1C
Est. average glucose Bld gHb Est-mCnc: 123 mg/dL
Hgb A1c MFr Bld: 5.9 % — ABNORMAL HIGH (ref 4.8–5.6)

## 2019-12-15 LAB — HIV ANTIBODY (ROUTINE TESTING W REFLEX): HIV Screen 4th Generation wRfx: NONREACTIVE

## 2019-12-15 NOTE — Telephone Encounter (Signed)
Requested Prescriptions  Pending Prescriptions Disp Refills  . spironolactone (ALDACTONE) 25 MG tablet [Pharmacy Med Name: Spironolactone 25 MG Oral Tablet] 90 tablet 1    Sig: Take 1 tablet by mouth once daily     Cardiovascular: Diuretics - Aldosterone Antagonist Passed - 12/15/2019 11:48 AM      Passed - Cr in normal range and within 360 days    Creat  Date Value Ref Range Status  07/08/2017 1.14 (H) 0.50 - 1.05 mg/dL Final    Comment:    For patients >51 years of age, the reference limit for Creatinine is approximately 13% higher for people identified as African-American. .    Creatinine, Ser  Date Value Ref Range Status  09/17/2019 0.89 0.44 - 1.00 mg/dL Final         Passed - K in normal range and within 360 days    Potassium  Date Value Ref Range Status  09/17/2019 3.6 3.5 - 5.1 mmol/L Final         Passed - Na in normal range and within 360 days    Sodium  Date Value Ref Range Status  09/17/2019 140 135 - 145 mmol/L Final  08/18/2018 142 134 - 144 mmol/L Final         Passed - Last BP in normal range    BP Readings from Last 1 Encounters:  12/14/19 104/69         Passed - Valid encounter within last 6 months    Recent Outpatient Visits          Yesterday Encounter for annual wellness visit (AWV) in Medicare patient   [place], Dionne Bucy, MD   1 week ago Muscle spasm   [place], [place]   9 months ago 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

## 2019-12-15 NOTE — Telephone Encounter (Signed)
Requested Prescriptions  Pending Prescriptions Disp Refills  . atorvastatin (LIPITOR) 80 MG tablet [Pharmacy Med Name: Atorvastatin Calcium 80 MG Oral Tablet] 90 tablet 1    Sig: Take 1 tablet by mouth once daily     Cardiovascular:  Antilipid - Statins Failed - 12/15/2019 11:48 AM      Failed - LDL in normal range and within 360 days    LDL Cholesterol (Calc)  Date Value Ref Range Status  07/08/2017 66 mg/dL (calc) Final    Comment:    Reference range: <100 . Desirable range <100 mg/dL for primary prevention;   <70 mg/dL for patients with CHD or diabetic patients  with > or = 2 CHD risk factors. [name] LDL-C is now calculated using the Martin-Hopkins  calculation, which is a validated novel method providing  better accuracy than the Friedewald equation in the  estimation of LDL-C.  Cresenciano Genre et al. Annamaria Helling. 3818;299(37): 2061-2068  (http://education.[employer].com/faq/FAQ164)    LDL Chol Calc (NIH)  Date Value Ref Range Status  12/14/2019 61 0 - 99 mg/dL Final         Passed - Total Cholesterol in normal range and within 360 days    Cholesterol, Total  Date Value Ref Range Status  12/14/2019 126 100 - 199 mg/dL Final         Passed - HDL in normal range and within 360 days    HDL  Date Value Ref Range Status  12/14/2019 48 >39 mg/dL Final         Passed - Triglycerides in normal range and within 360 days    Triglycerides  Date Value Ref Range Status  12/14/2019 87 0 - 149 mg/dL Final         Passed - Patient is not pregnant      Passed - Valid encounter within last 12 months    Recent Outpatient Visits          Yesterday Encounter for annual wellness visit (AWV) in Medicare patient   [place], Dionne Bucy, MD   1 week ago Muscle spasm   [place], [place]   9 months ago 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

## 2019-12-17 ENCOUNTER — Other Ambulatory Visit: Payer: Self-pay | Admitting: Family Medicine

## 2019-12-17 ENCOUNTER — Telehealth: Payer: Self-pay

## 2019-12-17 NOTE — Telephone Encounter (Signed)
-----   Message from Virginia Crews, MD sent at 12/17/2019  8:16 AM EDT ----- Normal labs, except Hemoglobin A1c, 3 month avg of blood sugars, is in prediabetic range.  In order to prevent progression to diabetes, recommend low carb diet and regular exercise.

## 2019-12-17 NOTE — Telephone Encounter (Signed)
LMTCB 12/17/2019.  [facility] please advise pt of lab results below.  Thanks,   -Mickel Baas

## 2019-12-18 NOTE — Telephone Encounter (Signed)
Attempted to call pt.  Advised to return call to office to discuss recent lab results.

## 2019-12-18 NOTE — Telephone Encounter (Signed)
Pt returned call. Result note read to pt, verbalizes understanding.

## 2019-12-19 ENCOUNTER — Encounter: Payer: Self-pay | Admitting: Internal Medicine

## 2019-12-19 ENCOUNTER — Other Ambulatory Visit: Payer: Self-pay

## 2019-12-19 ENCOUNTER — Ambulatory Visit (INDEPENDENT_AMBULATORY_CARE_PROVIDER_SITE_OTHER): Payer: Medicare Other | Admitting: Internal Medicine

## 2019-12-19 VITALS — BP 106/64 | HR 74 | Ht 62.0 in | Wt 224.1 lb

## 2019-12-19 DIAGNOSIS — Z952 Presence of prosthetic heart valve: Secondary | ICD-10-CM

## 2019-12-19 DIAGNOSIS — I2510 Atherosclerotic heart disease of native coronary artery without angina pectoris: Secondary | ICD-10-CM | POA: Diagnosis not present

## 2019-12-19 DIAGNOSIS — I5022 Chronic systolic (congestive) heart failure: Secondary | ICD-10-CM

## 2019-12-19 DIAGNOSIS — I10 Essential (primary) hypertension: Secondary | ICD-10-CM

## 2019-12-19 DIAGNOSIS — Z1211 Encounter for screening for malignant neoplasm of colon: Secondary | ICD-10-CM

## 2019-12-19 NOTE — Progress Notes (Signed)
Patient contacted office to reschedule her canceled colonoscopy with Dr. Vicente Males.  She has been rescheduled to Friday May 14th.  She has also been advised of COVID Test Date Wednesday 01/09/20.  New instructions will be mailed.  She still has her bowel prep.  Thanks,  [place], [place]

## 2019-12-19 NOTE — Patient Instructions (Signed)
Medication Instructions:  Your physician recommends that you continue on your current medications as directed. Please refer to the Current Medication list given to you today.  *If you need a refill on your cardiac medications before your next appointment, please call your pharmacy*   Lab Work: Your physician recommends that you return for lab work in: [place].   If you have labs (blood work) drawn today and your tests are completely normal, you will receive your results only by: [name] MyChart Message (if you have MyChart) OR . A paper copy in the mail If you have any lab test that is abnormal or we need to change your treatment, we will call you to review the results.   Testing/Procedures: none   Follow-Up: At [facility], you and your health needs are our priority.  As part of our continuing mission to provide you with exceptional heart care, we have created designated Provider Care Teams.  These Care Teams include your primary Cardiologist (physician) and Advanced Practice Providers (APPs -  Physician Assistants and Nurse Practitioners) who all work together to provide you with the care you need, when you need it.  We recommend signing up for the patient portal called "MyChart".  Sign up information is provided on this After Visit Summary.  MyChart is used to connect with patients for Virtual Visits (Telemedicine).  Patients are able to view lab/test results, encounter notes, upcoming appointments, etc.  Non-urgent messages can be sent to your provider as well.   To learn more about what you can do with MyChart, go to [email or web address].    Your next appointment:   6 month(s)  The format for your next appointment:   In Person  Provider:    You may see [name], MD or one of the following Advanced Practice Providers on your designated Care Team:    Murray Hodgkins, NP  Christell Faith, PA-C  Marrianne Mood, PA-C

## 2019-12-19 NOTE — Progress Notes (Signed)
Follow-up Outpatient Visit Date: 12/19/2019  Primary Care Provider: Virginia Crews, MD [address] [place] [address]  Chief Complaint: Follow-up cardiomyopathy, CAD, and valvular heart disease  HPI:  Vanessa Carlson is a 59 y.o. 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  I last saw her in January, at which time she was feeling relatively well.  She complained of random pains in the right upper chest, which had been stable.  Today, the patient reports that she feels relatively well.  She has stable exertional dyspnea and occasional dependent leg edema, both of which are stable.  She has not had any chest pain, palpitations, or lightheadedness.  She is scheduled for colonoscopy next month and will be receiving Lovenox bridge while off warfarin per her PCP.  --------------------------------------------------------------------------------------------------  Cardiovascular History & Procedures: Cardiovascular Problems:  Coronary artery disease status post anterior STEMI (2016)  Ischemic cardiomyopathy with chronic systolic heart failure  Rheumatic heart disease status post mechanical mitral valve replacement (2006)  Risk Factors:  Known coronary artery disease and hypertension  Cath/PCI:  LHC (2016, [place]): Reportedly proximal occlusion of the LAD, which could not be crossed with a wire.  CV Surgery:  Mitral valve replacement (2006, [facility]): 27 mm St. Jude bileaflet valve  EP Procedures and Devices:  ICD (04/01/15,  [facility]): Medtronic single-chamber ICD  Non-Invasive Evaluation(s):  TTE (01/05/18): Mildly dilated LV. LVEF 20 to 25% with diffuse hypokinesis and severe hypokinesis of the anterior and anteroseptal myocardium. Apical akinesis. Mechanical mitral valve with mean gradient of 4 mmHg. Mild left atrial enlargement. Normal RV function. Moderate TR. PASP 43 mmHg.  TTE (11/20/15): Normal LV size and wall thickness. LVEF 20-25% with severe diastolic dysfunction. Mildly reduced RV contraction. Normal MV prosthesis function.  Recent CV Pertinent Labs: Lab Results  Component Value Date   CHOL 126 12/14/2019   HDL 48 12/14/2019   LDLCALC 61 12/14/2019   LDLCALC 66 07/08/2017   TRIG 87 12/14/2019   CHOLHDL 2.9 08/18/2018   CHOLHDL 2.8 07/08/2017   INR 1.6 (A) 12/14/2019   INR 1.10 09/23/2017   K 4.8 12/19/2019   BUN 14 12/19/2019   CREATININE 0.97 12/19/2019   CREATININE 1.14 (H) 07/08/2017    Past medical and surgical history were reviewed and updated in EPIC.  Current Meds  Medication Sig  . aspirin 81 MG chewable tablet Chew 81 mg by mouth daily.  [name] atorvastatin (LIPITOR) 80 MG tablet Take 1 tablet by mouth once daily  . cyclobenzaprine (FLEXERIL) 5 MG tablet Take 1 tablet (5 mg total) by mouth 3 (three) times daily as needed for muscle spasms.  . diclofenac Sodium (VOLTAREN) 1 % GEL APPLY 2 GRAMS TOPICALLY 4 TIMES DAILY  . enoxaparin (LOVENOX) 100 MG/ML injection Inject 1 mL (100 mg total) into the skin every 12 (twelve) hours for 5 days.  . fluticasone (FLONASE) 50 MCG/ACT nasal spray Place 2 sprays into both nostrils as needed for allergies or rhinitis.  . furosemide (LASIX) 40 MG tablet Take 2 tablets (80 mg total) by mouth 2 (two) times daily.  [name] gabapentin (NEURONTIN) 300 MG capsule Take 1 capsule (300 mg total)  by mouth 2 (two) times daily.  . Lidocaine 5 % CREA Apply 1 application topically every 6 (six) hours as needed.  . methylPREDNISolone (MEDROL) 4 MG TBPK tablet 6 day  taper; take as directed on package instructions  . metoprolol succinate (TOPROL-XL) 50 MG 24 hr tablet Take 1/2 tablet (25 mg) by mouth two times a day. Take with or immediately following a meal.  . nitroGLYCERIN (NITROSTAT) 0.3 MG SL tablet Place 1 tablet (0.3 mg total) under the tongue every 5 (five) minutes as needed for chest pain.  . sacubitril-valsartan (ENTRESTO) 24-26 MG Take 1 tablet by mouth 2 (two) times daily.  [name] spironolactone (ALDACTONE) 25 MG tablet Take 1 tablet by mouth once daily  . traMADol (ULTRAM) 50 MG tablet Take 1 tablet (50 mg total) by mouth every 6 (six) hours as needed.  . traZODone (DESYREL) 50 MG tablet Take 0.5-1 tablets (25-50 mg total) by mouth at bedtime as needed for sleep.  [name] warfarin (COUMADIN) 10 MG tablet Take 1 tablet (10 mg total) by mouth as directed.  . warfarin (COUMADIN) 7.5 MG tablet Take 1 tablet (7.5 mg total) by mouth daily.    Allergies: Patient has no known allergies.  Social History   Tobacco Use  . Smoking status: Former Smoker    Packs/day: 1.00    Years: 4.00    Pack years: 4.00    Types: Cigarettes    Quit date: 1998    Years since quitting: 23.3  . Smokeless tobacco: Never Used  Substance Use Topics  . Alcohol use: No  . Drug use: No    Family History  Problem Relation Age of Onset  . Heart attack Mother   . Hypertension Mother   . Diabetes Mother   . Emphysema Father        smoker  . Diabetes Maternal Grandmother   . Kidney disease Maternal Grandmother   . Breast cancer Maternal Aunt   . Breast cancer Maternal Aunt   . Colon cancer Maternal Uncle     Review of Systems: A 12-system review of systems was performed and was negative except as noted in the HPI.  --------------------------------------------------------------------------------------------------  Physical Exam: BP 106/64 (BP Location: Left Arm, Patient Position: Sitting, Cuff Size: Normal)   Pulse 74   Ht 5\' 2"  (1.575 m)   Wt 224 lb 2 oz (101.7 kg)    LMP  (LMP Unknown)   SpO2 97%   BMI 40.99 kg/m   General: NAD. Neck: No JVD or HJR. Lungs: Clear to auscultation bilaterally. Heart: Regular rate and rhythm with mechanical S1.  No murmurs or rubs. Abdomen: Soft, nontender, nondistended. Extremities: No lower extremity edema.  EKG: Normal sinus rhythm with lateral Q waves and nonspecific T wave abnormality.  No significant change from prior tracing on 09/19/2019.  Lab Results  Component Value Date   WBC 4.6 09/17/2019   HGB 12.8 09/17/2019   HCT 41.0 09/17/2019   MCV 90.3 09/17/2019   PLT 168 09/17/2019    Lab Results  Component Value Date   NA 145 (H) 12/19/2019   K 4.8 12/19/2019   CL 107 (H) 12/19/2019   CO2 27 12/19/2019   BUN 14 12/19/2019   CREATININE 0.97 12/19/2019   GLUCOSE 111 (H) 12/19/2019   ALT 15 09/17/2019    Lab Results  Component Value Date   CHOL 126 12/14/2019   HDL 48 12/14/2019   LDLCALC 61 12/14/2019   TRIG 87 12/14/2019   CHOLHDL 2.9 08/18/2018    --------------------------------------------------------------------------------------------------  ASSESSMENT AND PLAN: Chronic systolic heart failure: Ms. Stuber appears euvolemic with stable NYHA class II symptoms.  Continue current doses of metoprolol succinate, spironolactone, and Entresto.  Soft blood pressure precludes escalation today.  Continue follow-up with the device clinic.  I will check a BMP today to ensure stable renal function and electrolytes.  Coronary artery disease: No signs or symptoms to suggest worsening coronary insufficiency.  Continue aspirin and atorvastatin for secondary prevention.  Status post mitral valve replacement: No signs or symptoms to suggest valve dysfunction.  Continue indefinite anticoagulation with aspirin and warfarin, managed by her PCP.  I agree with Lovenox bridge for upcoming colonoscopy.  Continue with SBE prophylaxis for dental procedures.  Morbid obesity: BMI greater than 40.  Weight loss  encouraged through diet and exercise.  Follow-up: Return to clinic in 6 months.  [name], MD 12/20/2019 12:01 PM

## 2019-12-20 ENCOUNTER — Encounter: Payer: Self-pay | Admitting: Internal Medicine

## 2019-12-20 LAB — BASIC METABOLIC PANEL
BUN/Creatinine Ratio: 14 (ref 9–23)
BUN: 14 mg/dL (ref 6–24)
CO2: 27 mmol/L (ref 20–29)
Calcium: 9.1 mg/dL (ref 8.7–10.2)
Chloride: 107 mmol/L — ABNORMAL HIGH (ref 96–106)
Creatinine, Ser: 0.97 mg/dL (ref 0.57–1.00)
GFR calc Af Amer: 74 mL/min/{1.73_m2} (ref >59)
GFR calc non Af Amer: 64 mL/min/{1.73_m2} (ref >59)
Glucose: 111 mg/dL — ABNORMAL HIGH (ref 65–99)
Potassium: 4.8 mmol/L (ref 3.5–5.2)
Sodium: 145 mmol/L — ABNORMAL HIGH (ref 134–144)

## 2019-12-25 ENCOUNTER — Other Ambulatory Visit: Payer: Self-pay

## 2019-12-25 ENCOUNTER — Ambulatory Visit (INDEPENDENT_AMBULATORY_CARE_PROVIDER_SITE_OTHER): Payer: Medicare Other | Admitting: *Deleted

## 2019-12-25 ENCOUNTER — Ambulatory Visit (INDEPENDENT_AMBULATORY_CARE_PROVIDER_SITE_OTHER): Payer: Medicare Other

## 2019-12-25 DIAGNOSIS — Z952 Presence of prosthetic heart valve: Secondary | ICD-10-CM

## 2019-12-25 DIAGNOSIS — I480 Paroxysmal atrial fibrillation: Secondary | ICD-10-CM | POA: Diagnosis not present

## 2019-12-25 LAB — CUP PACEART REMOTE DEVICE CHECK
Battery Remaining Longevity: 95 mo
Battery Voltage: 3.01 V
Brady Statistic RV Percent Paced: 0.01 %
Date Time Interrogation Session: 20210427022824
HighPow Impedance: 87 Ohm
Implantable Lead Implant Date: 20160802
Implantable Lead Location: 753860
Implantable Pulse Generator Implant Date: 20160802
Lead Channel Impedance Value: 551 Ohm
Lead Channel Impedance Value: 608 Ohm
Lead Channel Pacing Threshold Amplitude: 0.75 V
Lead Channel Pacing Threshold Pulse Width: 0.4 ms
Lead Channel Sensing Intrinsic Amplitude: 12.5 mV
Lead Channel Sensing Intrinsic Amplitude: 12.5 mV
Lead Channel Setting Pacing Amplitude: 2 V
Lead Channel Setting Pacing Pulse Width: 0.4 ms
Lead Channel Setting Sensing Sensitivity: 0.3 mV

## 2019-12-25 LAB — POCT INR
INR: 1.5 — AB (ref 2.0–3.0)
PT: 17.7

## 2019-12-25 NOTE — Patient Instructions (Signed)
Description   7.5 mg daily except 10 mg T, Th, Sat F/U 2 weeks

## 2019-12-25 NOTE — Progress Notes (Signed)
ICD Remote  

## 2020-01-03 ENCOUNTER — Encounter: Payer: Self-pay | Admitting: Family Medicine

## 2020-01-07 ENCOUNTER — Ambulatory Visit (INDEPENDENT_AMBULATORY_CARE_PROVIDER_SITE_OTHER): Payer: Medicare Other

## 2020-01-07 DIAGNOSIS — I5022 Chronic systolic (congestive) heart failure: Secondary | ICD-10-CM

## 2020-01-07 DIAGNOSIS — Z95810 Presence of automatic (implantable) cardiac defibrillator: Secondary | ICD-10-CM

## 2020-01-08 ENCOUNTER — Ambulatory Visit (INDEPENDENT_AMBULATORY_CARE_PROVIDER_SITE_OTHER): Payer: Medicare Other | Admitting: Internal Medicine

## 2020-01-08 ENCOUNTER — Other Ambulatory Visit: Payer: Self-pay

## 2020-01-08 ENCOUNTER — Encounter: Payer: Self-pay | Admitting: Internal Medicine

## 2020-01-08 ENCOUNTER — Ambulatory Visit: Payer: Medicare Other

## 2020-01-08 VITALS — BP 110/68 | HR 76 | Ht 62.0 in | Wt 227.1 lb

## 2020-01-08 DIAGNOSIS — R002 Palpitations: Secondary | ICD-10-CM

## 2020-01-08 DIAGNOSIS — I255 Ischemic cardiomyopathy: Secondary | ICD-10-CM

## 2020-01-08 DIAGNOSIS — I10 Essential (primary) hypertension: Secondary | ICD-10-CM | POA: Diagnosis not present

## 2020-01-08 DIAGNOSIS — I5022 Chronic systolic (congestive) heart failure: Secondary | ICD-10-CM

## 2020-01-08 NOTE — Progress Notes (Signed)
Patient Care Team: Virginia Crews, MD as PCP - General (Family Medicine) End, Harrell Gave, MD as PCP - Cardiology (Cardiology) Nestor Lewandowsky, MD as Referring Physician (Cardiothoracic Surgery)   HPI  Vanessa Carlson is a 59 y.o. female Seen in follow-up for ICD implanted for primary prevention in the setting of a prior anterior wall MI complicated by shock and pericarditis ([facility]).  She has a history of remote mitral valve replacement-mechanical -2006  2019 was diagnosed with lung cancer and underwent radiation therapy--with further evolution more expansive radiation 2020 and a scan 2021 that showed no "concerning findings of recurrent or metastatic disease "  DATE TEST    3/16    echo   EF 35 %   3/17    echo   EF 25 %          Date Cr K Hgb  4/21 0.97 4.8 12.8 (1/21)         The patient denies chest pain, shortness of breath, nocturnal dyspnea, orthopnea or peripheral edema.  There have been no palpitations, lightheadedness or syncope.    Records and Results Reviewed   Past Medical History:  Diagnosis Date  . AICD (automatic cardioverter/defibrillator) present   . Anterior myocardial infarction [facility])    a. 10/2014 - occluded LAD, complicated by cardiogenic shock-->Med Rx as interventional team was unable to open LAD.  [name] Cancer of upper lobe of right lung ([place]) 09/2017   radiation therapy right side  . Cervical cancer ([place])    in remission for ~20 yrs, s/p hysterectomy  . Chronic combined systolic and diastolic CHF (congestive heart failure) ([place])    a. 10/2015 Echo: EF 20-25%, sev diast dysfxn, mildly reduced RV fxn. Nl MV prosthesis; b. 12/2017 Echo: EF 20-25%, diff HK. Sev ant/antsept HK, apical AK. Mild MS (mean graad 73mmHg). Mod TR. PASP 75mmHg.  [name] Coronary artery disease    a. 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  . Essential hypertension   . Ischemic  cardiomyopathy    a. 03/2015 s/p MDT single lead AICD;  b. 10/2015 Echo: EF 20-25%;  c. 12/2017 Echo: EF 20-25%, diff HK.  . Mass of upper lobe of right lung    a. noted on CXR & CT 08/2017 w/ abnl PET CT.  [name] Mitral valve disease    a. 2006 s/p MVR w/ 24mm SJM bileaflet mechanical valve-->chronic coumadin/ASA; b. 12/2017 Echo: Mild MS. Mean gradient 79mmHg.  [name] Personal history of radiation therapy    f/u lung ca    Past Surgical History:  Procedure Laterality Date  . APPENDECTOMY  1998  . BREAST EXCISIONAL BIOPSY Left 80s   benign  . CARDIAC CATHETERIZATION    . CARDIAC DEFIBRILLATOR PLACEMENT    . RADICAL HYSTERECTOMY  1998  . SHOULDER SURGERY Bilateral    rotator cuff tears  . VALVE REPLACEMENT     Mitral valve; 27 mm St. Jude bileaflet valve    Current Meds  Medication Sig  . aspirin 81 MG chewable tablet Chew 81 mg by mouth daily.  [name] atorvastatin (LIPITOR) 80 MG tablet Take 1 tablet by mouth once daily  . cyclobenzaprine (FLEXERIL) 5 MG tablet Take 1 tablet (5 mg total) by mouth 3 (three) times daily as needed for muscle spasms.  . diclofenac Sodium (VOLTAREN) 1 % GEL APPLY 2 GRAMS TOPICALLY 4 TIMES DAILY  . enoxaparin (LOVENOX) 100 MG/ML injection Inject  1 mL (100 mg total) into the skin every 12 (twelve) hours for 5 days.  . fluticasone (FLONASE) 50 MCG/ACT nasal spray Place 2 sprays into both nostrils as needed for allergies or rhinitis.  . furosemide (LASIX) 40 MG tablet Take 2 tablets (80 mg total) by mouth 2 (two) times daily.  [name] gabapentin (NEURONTIN) 300 MG capsule Take 1 capsule (300 mg total) by mouth 2 (two) times daily.  . Lidocaine 5 % CREA Apply 1 application topically every 6 (six) hours as needed.  . metoprolol succinate (TOPROL-XL) 50 MG 24 hr tablet Take 1/2 tablet (25 mg) by mouth two times a day. Take with or immediately following a meal.  . nitroGLYCERIN (NITROSTAT) 0.3 MG SL tablet Place 1 tablet (0.3 mg total) under the tongue every 5 (five) minutes as needed  for chest pain.  . sacubitril-valsartan (ENTRESTO) 24-26 MG Take 1 tablet by mouth 2 (two) times daily.  [name] spironolactone (ALDACTONE) 25 MG tablet Take 1 tablet by mouth once daily  . traMADol (ULTRAM) 50 MG tablet Take 1 tablet (50 mg total) by mouth every 6 (six) hours as needed.  . traZODone (DESYREL) 50 MG tablet Take 0.5-1 tablets (25-50 mg total) by mouth at bedtime as needed for sleep.  [name] warfarin (COUMADIN) 10 MG tablet Take 1 tablet (10 mg total) by mouth as directed.  . warfarin (COUMADIN) 7.5 MG tablet Take 1 tablet (7.5 mg total) by mouth daily.    No Known Allergies    Review of Systems negative except from HPI and PMH  Physical Exam BP 110/68 (BP Location: Left Arm, Patient Position: Sitting, Cuff Size: Normal)   Pulse 76   Ht 5\' 2"  (1.575 m)   Wt 227 lb 2 oz (103 kg)   LMP  (LMP Unknown)   SpO2 98%   BMI 41.54 kg/m  Well developed and well nourished in no acute distress HENT normal E scleral and icterus clear Neck Supple JVP flat; carotids brisk and full Clear to ausculation Device pocket well healed; without hematoma or erythema.  There is no tethering   Regular rate and rhythm, mechanical S1 to 2/6 murmur Soft with active bowel sounds No clubbing cyanosis  Edema Alert and oriented, grossly normal motor and sensory function Skin Warm and Dry  ECG sinus @ 76 15/08/37  Estimated Creatinine Clearance: 69.4 mL/min (by C-G formula based on SCr of 0.97 mg/dL).   Assessment and  Plan Ischemic cardiomyopathy prior MI  Mitral valve replacement-mechanical  Implantable defibrillator-Medtronic The patient's device was interrogated.  The information was reviewed. No changes were made in the programming.     Congestive heart failure-chronic-systolic  Lung cancer-remission  VT nonsustained new   Euvolemic continue current meds  Without symptoms of ischemia  VT nonsustained -- asymptomatic  No bleeding        Current medicines are reviewed at  length with the patient today .  The patient does not  have concerns regarding medicines.

## 2020-01-08 NOTE — Progress Notes (Signed)
EPIC Encounter for ICM Monitoring  Patient Name: DESTENIE INGBER is a 59 y.o. female Date: 01/08/2020 Primary Care Physican: Virginia Crews, MD Primary Cardiologist: Carlis Abbott, NP HF Electrophysiologist: Caryl Comes 12/19/2019 Office CZYSAY:301 lbs   Transmission reviewed.  Patient has an appt with Dr Caryl Comes 01/08/20.  Optivol thoracic impedancenormal.  Prescribed:Furosemide40 mg2tablets (80 mg total) twice a day.   Labs: 12/19/2019 Creatinine 0.97, BUN 14, Potassium 4.8, Sodium 145, GFR 64-74 09/17/2019 Creatinine 0.89, BUN 13, Potassium 3.6, Sodium 140, GFR >60 A complete set of results can be found in Results Review.  Recommendations:None  Follow-up plan: ICM clinic phone appointment on6/14/2021. 91 day device clinic remote transmission7/27/2021.   Copy of ICM check sent to [place].  3 month ICM trend: 01/07/2020    1 Year ICM trend:       [name], RN 01/08/2020 10:41 AM

## 2020-01-08 NOTE — Patient Instructions (Signed)

## 2020-01-09 ENCOUNTER — Other Ambulatory Visit
Admission: RE | Admit: 2020-01-09 | Discharge: 2020-01-09 | Disposition: A | Discharge status: Home / Self Care | Payer: Medicare Other | Source: Ambulatory Visit | Attending: Gastroenterology | Admitting: Gastroenterology

## 2020-01-09 DIAGNOSIS — Z01812 Encounter for preprocedural laboratory examination: Secondary | ICD-10-CM | POA: Diagnosis not present

## 2020-01-09 DIAGNOSIS — Z20822 Contact with and (suspected) exposure to covid-19: Secondary | ICD-10-CM | POA: Diagnosis not present

## 2020-01-09 LAB — SARS CORONAVIRUS 2 (TAT 6-24 HRS): SARS Coronavirus 2: NEGATIVE

## 2020-01-11 ENCOUNTER — Encounter: Payer: Self-pay | Admitting: Gastroenterology

## 2020-01-11 ENCOUNTER — Ambulatory Visit
Admission: RE | Admit: 2020-01-11 | Discharge: 2020-01-11 | Disposition: A | Discharge status: Home / Self Care | Payer: Medicare Other | Attending: Gastroenterology | Admitting: Gastroenterology

## 2020-01-11 ENCOUNTER — Ambulatory Visit: Payer: Medicare Other | Admitting: Certified Registered"

## 2020-01-11 ENCOUNTER — Encounter
Admission: RE | Disposition: A | Discharge status: Home / Self Care | Payer: Self-pay | Source: Home / Self Care | Attending: Gastroenterology

## 2020-01-11 DIAGNOSIS — Z952 Presence of prosthetic heart valve: Secondary | ICD-10-CM | POA: Diagnosis not present

## 2020-01-11 DIAGNOSIS — Z95810 Presence of automatic (implantable) cardiac defibrillator: Secondary | ICD-10-CM | POA: Insufficient documentation

## 2020-01-11 DIAGNOSIS — I5042 Chronic combined systolic (congestive) and diastolic (congestive) heart failure: Secondary | ICD-10-CM | POA: Insufficient documentation

## 2020-01-11 DIAGNOSIS — I252 Old myocardial infarction: Secondary | ICD-10-CM | POA: Diagnosis not present

## 2020-01-11 DIAGNOSIS — Z6841 Body Mass Index (BMI) 40.0 and over, adult: Secondary | ICD-10-CM | POA: Insufficient documentation

## 2020-01-11 DIAGNOSIS — Z8541 Personal history of malignant neoplasm of cervix uteri: Secondary | ICD-10-CM | POA: Insufficient documentation

## 2020-01-11 DIAGNOSIS — Z1211 Encounter for screening for malignant neoplasm of colon: Secondary | ICD-10-CM | POA: Diagnosis not present

## 2020-01-11 DIAGNOSIS — I110 Hypertensive heart disease with heart failure: Secondary | ICD-10-CM | POA: Diagnosis not present

## 2020-01-11 DIAGNOSIS — Z79899 Other long term (current) drug therapy: Secondary | ICD-10-CM | POA: Diagnosis not present

## 2020-01-11 DIAGNOSIS — K5730 Diverticulosis of large intestine without perforation or abscess without bleeding: Secondary | ICD-10-CM | POA: Diagnosis not present

## 2020-01-11 DIAGNOSIS — Z85118 Personal history of other malignant neoplasm of bronchus and lung: Secondary | ICD-10-CM | POA: Insufficient documentation

## 2020-01-11 DIAGNOSIS — I255 Ischemic cardiomyopathy: Secondary | ICD-10-CM | POA: Diagnosis not present

## 2020-01-11 DIAGNOSIS — Z7982 Long term (current) use of aspirin: Secondary | ICD-10-CM | POA: Diagnosis not present

## 2020-01-11 DIAGNOSIS — Z791 Long term (current) use of non-steroidal anti-inflammatories (NSAID): Secondary | ICD-10-CM | POA: Insufficient documentation

## 2020-01-11 DIAGNOSIS — Z87891 Personal history of nicotine dependence: Secondary | ICD-10-CM | POA: Diagnosis not present

## 2020-01-11 DIAGNOSIS — Z7901 Long term (current) use of anticoagulants: Secondary | ICD-10-CM | POA: Insufficient documentation

## 2020-01-11 DIAGNOSIS — I2510 Atherosclerotic heart disease of native coronary artery without angina pectoris: Secondary | ICD-10-CM | POA: Diagnosis not present

## 2020-01-11 DIAGNOSIS — Z923 Personal history of irradiation: Secondary | ICD-10-CM | POA: Diagnosis not present

## 2020-01-11 DIAGNOSIS — K5790 Diverticulosis of intestine, part unspecified, without perforation or abscess without bleeding: Secondary | ICD-10-CM | POA: Diagnosis not present

## 2020-01-11 HISTORY — PX: COLONOSCOPY WITH PROPOFOL: SHX5780

## 2020-01-11 SURGERY — COLONOSCOPY WITH PROPOFOL
Anesthesia: General

## 2020-01-11 MED ORDER — PROPOFOL 500 MG/50ML IV EMUL
INTRAVENOUS | Status: DC | PRN
  Administered 2020-01-11: 100 ug/kg/min via INTRAVENOUS

## 2020-01-11 MED ORDER — PROPOFOL 10 MG/ML IV BOLUS
INTRAVENOUS | Status: DC | PRN
  Administered 2020-01-11: 40 mg via INTRAVENOUS

## 2020-01-11 MED ORDER — PROPOFOL 10 MG/ML IV BOLUS
INTRAVENOUS | Status: AC
  Filled 2020-01-11: qty 20

## 2020-01-11 MED ORDER — PHENYLEPHRINE HCL (PRESSORS) 10 MG/ML IV SOLN
INTRAVENOUS | Status: AC
  Filled 2020-01-11: qty 1

## 2020-01-11 MED ORDER — SODIUM CHLORIDE 0.9 % IV SOLN
INTRAVENOUS | Status: DC
  Administered 2020-01-11: 1000 mL via INTRAVENOUS

## 2020-01-11 NOTE — Op Note (Addendum)
[facility] Gastroenterology Patient Name: Vanessa Carlson Procedure Date: 01/11/2020 9:35 AM MRN: [id number] Account #: [id number] Date of Birth: 14-Feb-1961 Admit Type: Outpatient Age: 59 Room: [facility] ENDO ROOM 2 Gender: Female Note Status: Finalized Procedure:             Colonoscopy Indications:           Screening for colorectal malignant neoplasm Providers:             Jonathon Bellows MD, MD Medicines:             Monitored Anesthesia Care Complications:         No immediate complications. Procedure:             Pre-Anesthesia Assessment:                        - Prior to the procedure, a History and Physical was                         performed, and patient medications, allergies and                         sensitivities were reviewed. The patient's tolerance                         of previous anesthesia was reviewed.                        - The risks and benefits of the procedure and the                         sedation options and risks were discussed with the                         patient. All questions were answered and informed                         consent was obtained.                        - ASA Grade Assessment: II - A patient with mild                         systemic disease.                        After obtaining informed consent, the colonoscope was                         passed under direct vision. Throughout the procedure,                         the patient's blood pressure, pulse, and oxygen                         saturations were monitored continuously. The                         Colonoscope was introduced through the anus and  advanced to the the cecum, identified by the                         appendiceal orifice. The colonoscopy was performed                         with ease. The patient tolerated the procedure well.                         The quality of the bowel preparation was excellent. Findings:  The perianal and digital rectal examinations were normal.      Multiple small-mouthed diverticula were found in the sigmoid colon.      The exam was otherwise without abnormality on direct and retroflexion       views. Impression:            - Diverticulosis in the sigmoid colon.                        - The examination was otherwise normal on direct and                         retroflexion views.                        - No specimens collected. Recommendation:        - Discharge patient to home (with escort).                        - Resume previous diet.                        - Continue present medications.                        - Repeat colonoscopy in 10 years for screening                         purposes. Procedure Code(s):     --- Professional ---                        [phone], Colonoscopy, flexible; diagnostic, including                         collection of specimen(s) by brushing or washing, when                         performed (separate procedure) Diagnosis Code(s):     --- Professional ---                        K57.30, Diverticulosis of large intestine without                         perforation or abscess without bleeding                        Z12.11, Encounter for screening for malignant neoplasm                         of colon CPT copyright 2019 American Medical  Association. All rights reserved. The codes documented in this report are preliminary and upon coder review may  be revised to meet current compliance requirements. Jonathon Bellows, MD Jonathon Bellows MD, MD 01/11/2020 10:09:55 AM This report has been signed electronically. Number of Addenda: 0 Note Initiated On: 01/11/2020 9:35 AM Scope Withdrawal Time: 0 hours 9 minutes 9 seconds  Total Procedure Duration: 0 hours 14 minutes 8 seconds  Estimated Blood Loss:  Estimated blood loss: none.      [facility]

## 2020-01-11 NOTE — Anesthesia Preprocedure Evaluation (Signed)
Anesthesia Evaluation  Patient identified by MRN, date of birth, ID band Patient awake    Reviewed: Allergy & Precautions, H&P , NPO status , Patient's Chart, lab work & pertinent test results, reviewed documented beta blocker date and time   History of Anesthesia Complications Negative for: history of anesthetic complications  Airway Mallampati: III  TM Distance: >3 FB Neck ROM: full    Dental no notable dental hx. (+) Upper Dentures, Edentulous Upper, Dental Advidsory Given   Pulmonary neg shortness of breath, neg COPD, neg recent URI, former smoker,  Lung cancer s/p radiation   Pulmonary exam normal breath sounds clear to auscultation       Cardiovascular Exercise Tolerance: Good hypertension, (-) angina+ CAD, + Past MI and +CHF  (-) Cardiac Stents and (-) CABG Normal cardiovascular exam+ dysrhythmias (-) pacemaker+ Cardiac Defibrillator + Valvular Problems/Murmurs (Mitral valve replacement)  Rhythm:regular Rate:Normal     Neuro/Psych negative neurological ROS  negative psych ROS   GI/Hepatic negative GI ROS, Neg liver ROS,   Endo/Other  neg diabetesMorbid obesity  Renal/GU negative Renal ROS  negative genitourinary   Musculoskeletal   Abdominal   Peds  Hematology negative hematology ROS (+)   Anesthesia Other Findings Past Medical History: No date: AICD (automatic cardioverter/defibrillator) present No date: Anterior myocardial infarction [facility])     Comment:  a. 10/2014 - occluded LAD, complicated by cardiogenic               shock-->Med Rx as interventional team was unable to open               LAD. 09/2017: Cancer of upper lobe of right lung ([place])     Comment:  radiation therapy right side No date: Cervical cancer ([place])     Comment:  in remission for ~20 yrs, s/p hysterectomy No date: Chronic combined systolic and diastolic CHF (congestive  heart failure) ([place])     Comment:  a. 10/2015 Echo: EF 20-25%, sev  diast dysfxn, mildly               reduced RV fxn. Nl MV prosthesis; b. 12/2017 Echo: EF               20-25%, diff HK. Sev ant/antsept HK, apical AK. Mild MS               (mean graad 75mmHg). Mod TR. PASP 61mmHg. No date: Coronary artery disease     Comment:  a. 10/2014 Ant STEMI/Cath: LAD 100p ->Med managed as               lesion could not be crossed-->complicated by CGS and               post-MI pericarditis ([facility]). No date: Essential hypertension No date: Ischemic cardiomyopathy     Comment:  a. 03/2015 s/p MDT single lead AICD;  b. 10/2015 Echo: EF               20-25%;  c. 12/2017 Echo: EF 20-25%, diff HK. No date: Mass of upper lobe of right lung     Comment:  a. noted on CXR & CT 08/2017 w/ abnl PET CT. No date: Mitral valve disease     Comment:  a. 2006 s/p MVR w/ 3mm SJM bileaflet mechanical               valve-->chronic coumadin/ASA; b. 12/2017 Echo: Mild MS.  Mean gradient 69mmHg. No date: Personal history of radiation therapy     Comment:  f/u lung ca   Reproductive/Obstetrics negative OB ROS                             Anesthesia Physical Anesthesia Plan  ASA: III  Anesthesia Plan: General   Post-op Pain Management:    Induction: Intravenous  PONV Risk Score and Plan: 3 and Propofol infusion and TIVA  Airway Management Planned: Natural Airway and Nasal Cannula  Additional Equipment:   Intra-op Plan:   Post-operative Plan:   Informed Consent: I have reviewed the patients History and Physical, chart, labs and discussed the procedure including the risks, benefits and alternatives for the proposed anesthesia with the patient or authorized representative who has indicated his/her understanding and acceptance.     Dental Advisory Given  Plan Discussed with: Anesthesiologist, CRNA and Surgeon  Anesthesia Plan Comments:         Anesthesia Quick Evaluation

## 2020-01-11 NOTE — Transfer of Care (Signed)
Immediate Anesthesia Transfer of Care Note  Patient: Vanessa Carlson  Procedure(s) Performed: COLONOSCOPY WITH PROPOFOL (N/A )  Patient Location: PACU and Endoscopy Unit  Anesthesia Type:General  Level of Consciousness: sedated  Airway & Oxygen Therapy: Patient Spontanous Breathing  Post-op Assessment: Report given to RN  Post vital signs: stable  Last Vitals:  Vitals Value Taken Time  BP    Temp    Pulse    Resp    SpO2      Last Pain:  Vitals:   01/11/20 0921  TempSrc: Temporal  PainSc: 0-No pain         Complications: No apparent anesthesia complications

## 2020-01-11 NOTE — Anesthesia Postprocedure Evaluation (Signed)
Anesthesia Post Note  Patient: Vanessa Carlson  Procedure(s) Performed: COLONOSCOPY WITH PROPOFOL (N/A )  Patient location during evaluation: Endoscopy Anesthesia Type: General Level of consciousness: awake and alert Pain management: pain level controlled Vital Signs Assessment: post-procedure vital signs reviewed and stable Respiratory status: spontaneous breathing, nonlabored ventilation, respiratory function stable and patient connected to nasal cannula oxygen Cardiovascular status: blood pressure returned to baseline and stable Postop Assessment: no apparent nausea or vomiting Anesthetic complications: no     Last Vitals:  Vitals:   01/11/20 1012 01/11/20 1022  BP: 91/61 103/78  Pulse:    Resp:    Temp: 36.6 C   SpO2:      Last Pain:  Vitals:   01/11/20 1032  TempSrc:   PainSc: 0-No pain                 Martha Clan

## 2020-01-11 NOTE — H&P (Signed)
Jonathon Bellows, MD [address], [place], [place], [place], [address] [address], [place], [place], [place], [address] Phone: [phone]  Fax: [phone]  Primary Care Physician:  Virginia Crews, MD   Pre-Procedure History & Physical: HPI:  Vanessa Carlson is a 59 y.o. female is here for an colonoscopy.   Past Medical History:  Diagnosis Date  . AICD (automatic cardioverter/defibrillator) present   . Anterior myocardial infarction [facility])    a. 10/2014 - occluded LAD, complicated by cardiogenic shock-->Med Rx as interventional team was unable to open LAD.  [name] Cancer of upper lobe of right lung ([place]) 09/2017   radiation therapy right side  . Cervical cancer ([place])    in remission for ~20 yrs, s/p hysterectomy  . Chronic combined systolic and diastolic CHF (congestive heart failure) ([place])    a. 10/2015 Echo: EF 20-25%, sev diast dysfxn, mildly reduced RV fxn. Nl MV prosthesis; b. 12/2017 Echo: EF 20-25%, diff HK. Sev ant/antsept HK, apical AK. Mild MS (mean graad 60mmHg). Mod TR. PASP 26mmHg.  [name] Coronary artery disease    a. 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  . Essential hypertension   . Ischemic cardiomyopathy    a. 03/2015 s/p MDT single lead AICD;  b. 10/2015 Echo: EF 20-25%;  c. 12/2017 Echo: EF 20-25%, diff HK.  . Mass of upper lobe of right lung    a. noted on CXR & CT 08/2017 w/ abnl PET CT.  [name] Mitral valve disease    a. 2006 s/p MVR w/ 52mm SJM bileaflet mechanical valve-->chronic coumadin/ASA; b. 12/2017 Echo: Mild MS. Mean gradient 58mmHg.  [name] Personal history of radiation therapy    f/u lung ca    Past Surgical History:  Procedure Laterality Date  . APPENDECTOMY  1998  . BREAST EXCISIONAL BIOPSY Left 80s   benign  . CARDIAC CATHETERIZATION    . CARDIAC DEFIBRILLATOR PLACEMENT    . RADICAL HYSTERECTOMY  1998  . SHOULDER SURGERY Bilateral    rotator cuff tears  .  VALVE REPLACEMENT     Mitral valve; 27 mm St. Jude bileaflet valve    Prior to Admission medications   Medication Sig Start Date End Date Taking? Authorizing Provider  aspirin 81 MG chewable tablet Chew 81 mg by mouth daily.   Yes [provider]  atorvastatin (LIPITOR) 80 MG tablet Take 1 tablet by mouth once daily 12/15/19  Yes Bacigalupo, Dionne Bucy, MD  cyclobenzaprine (FLEXERIL) 5 MG tablet Take 1 tablet (5 mg total) by mouth 3 (three) times daily as needed for muscle spasms. 12/07/19  Yes Mar Daring, PA-C  diclofenac Sodium (VOLTAREN) 1 % GEL APPLY 2 GRAMS TOPICALLY 4 TIMES DAILY 12/17/19  Yes Bacigalupo, Dionne Bucy, MD  fluticasone [facility]) 50 MCG/ACT nasal spray Place 2 sprays into both nostrils as needed for allergies or rhinitis.   Yes [provider]  furosemide (LASIX) 40 MG tablet Take 2 tablets (80 mg total) by mouth 2 (two) times daily. 02/26/19  Yes End, Harrell Gave, MD  gabapentin (NEURONTIN) 300 MG capsule Take 1 capsule (300 mg total) by mouth 2 (two) times daily. 04/18/19  Yes Mar Daring, PA-C  Lidocaine 5 % CREA Apply 1 application topically every 6 (six) hours as needed. 03/08/19  Yes Mar Daring, PA-C  metoprolol succinate (TOPROL-XL) 50 MG 24 hr tablet Take 1/2 tablet (25 mg) by mouth two times  a day. Take with or immediately following a meal. 12/28/18  Yes Hackney, Otila Kluver A, FNP  nitroGLYCERIN (NITROSTAT) 0.3 MG SL tablet Place 1 tablet (0.3 mg total) under the tongue every 5 (five) minutes as needed for chest pain. 12/28/18  Yes Hackney, Tina A, FNP  sacubitril-valsartan (ENTRESTO) 24-26 MG Take 1 tablet by mouth 2 (two) times daily. 10/31/19  Yes End, Harrell Gave, MD  spironolactone (ALDACTONE) 25 MG tablet Take 1 tablet by mouth once daily 12/15/19  Yes Bacigalupo, Dionne Bucy, MD  traMADol (ULTRAM) 50 MG tablet Take 1 tablet (50 mg total) by mouth every 6 (six) hours as needed. 09/17/19  Yes Mar Daring, PA-C  traZODone (DESYREL) 50 MG  tablet Take 0.5-1 tablets (25-50 mg total) by mouth at bedtime as needed for sleep. 12/07/17  Yes Bacigalupo, Dionne Bucy, MD  warfarin (COUMADIN) 10 MG tablet Take 1 tablet (10 mg total) by mouth as directed. 11/13/18  Yes Bacigalupo, Dionne Bucy, MD  warfarin (COUMADIN) 7.5 MG tablet Take 1 tablet (7.5 mg total) by mouth daily. 12/28/18  Yes Hackney, Otila Kluver A, FNP  enoxaparin (LOVENOX) 100 MG/ML injection Inject 1 mL (100 mg total) into the skin every 12 (twelve) hours for 5 days. 11/14/19 01/08/20  Virginia Crews, MD    Allergies as of 12/19/2019  . (No Known Allergies)    Family History  Problem Relation Age of Onset  . Heart attack Mother   . Hypertension Mother   . Diabetes Mother   . Emphysema Father        smoker  . Diabetes Maternal Grandmother   . Kidney disease Maternal Grandmother   . Breast cancer Maternal Aunt   . Breast cancer Maternal Aunt   . Colon cancer Maternal Uncle     Social History   Socioeconomic History  . Marital status: Married    Spouse name: Tosin  . Number of children: 2  . Years of education: 29  . Highest education level: Some college, no degree  Occupational History    Employer: [employer]  Tobacco Use  . Smoking status: Former Smoker    Packs/day: 1.00    Years: 4.00    Pack years: 4.00    Types: Cigarettes    Quit date: 1998    Years since quitting: 23.3  . Smokeless tobacco: Never Used  Substance and Sexual Activity  . Alcohol use: No  . Drug use: No  . Sexual activity: Yes    Partners: Male    Birth control/protection: Surgical  Other Topics Concern  . Not on file  Social History Narrative  . Not on file   Social Determinants of Health   Financial Resource Strain: Low Risk   . Difficulty of Paying Living Expenses: Not hard at all  Food Insecurity: No Food Insecurity  . Worried About Charity fundraiser in the Last Year: Never true  . Ran Out of Food in the Last Year: Never true  Transportation Needs: No Transportation  Needs  . Lack of Transportation (Medical): No  . Lack of Transportation (Non-Medical): No  Physical Activity: Inactive  . Days of Exercise per Week: 0 days  . Minutes of Exercise per Session: 0 min  Stress: No Stress Concern Present  . Feeling of Stress : Not at all  Social Connections: Slightly Isolated  . Frequency of Communication with Friends and Family: More than three times a week  . Frequency of Social Gatherings with Friends and Family: Twice a week  .  Attends Religious Services: More than 4 times per year  . Active Member of Clubs or Organizations: No  . Attends Archivist Meetings: Never  . Marital Status: Married  Human resources officer Violence: Not At Risk  . Fear of Current or Ex-Partner: No  . Emotionally Abused: No  . Physically Abused: No  . Sexually Abused: No    Review of Systems: See HPI, otherwise negative ROS  Physical Exam: BP (!) 143/82   Pulse 81   Temp (!) 97 F (36.1 C) (Temporal)   Resp 20   Ht 5\' 2"  (1.575 m)   Wt 102.1 kg   LMP  (LMP Unknown)   SpO2 100%   BMI 41.15 kg/m  General:   Alert,  pleasant and cooperative in NAD Head:  Normocephalic and atraumatic. Neck:  Supple; no masses or thyromegaly. Lungs:  Clear throughout to auscultation, normal respiratory effort.    Heart:  +S1, +S2, Regular rate and rhythm, No edema. Abdomen:  Soft, nontender and nondistended. Normal bowel sounds, without guarding, and without rebound.   Neurologic:  Alert and  oriented x4;  grossly normal neurologically.  Impression/Plan: ANATALIA KRONK is here for an colonoscopy to be performed for Screening colonoscopy average risk   Risks, benefits, limitations, and alternatives regarding  colonoscopy have been reviewed with the patient.  Questions have been answered.  All parties agreeable.   Jonathon Bellows, MD  01/11/2020, 9:36 AM

## 2020-01-15 ENCOUNTER — Ambulatory Visit: Payer: Medicare Other | Attending: Family Medicine

## 2020-01-15 ENCOUNTER — Other Ambulatory Visit: Payer: Self-pay

## 2020-01-15 ENCOUNTER — Ambulatory Visit: Payer: Medicare Other

## 2020-01-15 DIAGNOSIS — R252 Cramp and spasm: Secondary | ICD-10-CM | POA: Diagnosis not present

## 2020-01-15 DIAGNOSIS — M542 Cervicalgia: Secondary | ICD-10-CM

## 2020-01-15 NOTE — Addendum Note (Signed)
Addended by: Blain Pais on: 01/15/2020 01:02 PM   Modules accepted: Orders

## 2020-01-15 NOTE — Therapy (Signed)
[facility] [address] [place], [address] Phone: [phone]   Fax:  [phone]  Physical Therapy Evaluation  Patient Details  Name: Vanessa Carlson MRN: [id number] Date of Birth: [birth year] Referring Provider (PT): Brita Romp MD   Encounter Date: 01/15/2020  PT End of Session - 01/15/20 0853    Visit Number  1    Number of Visits  13    Date for PT Re-Evaluation  02/26/20    PT Start Time  0800    PT Stop Time  0900    PT Time Calculation (min)  60 min    Activity Tolerance  Patient tolerated treatment well    Behavior During Therapy  [facility] for tasks assessed/performed       Past Medical History:  Diagnosis Date  . AICD (automatic cardioverter/defibrillator) present   . Anterior myocardial infarction [facility])    a. 10/2014 - occluded LAD, complicated by cardiogenic shock-->Med Rx as interventional team was unable to open LAD.  [name] Cancer of upper lobe of right lung ([place]) 09/2017   radiation therapy right side  . Cervical cancer ([place])    in remission for ~20 yrs, s/p hysterectomy  . Chronic combined systolic and diastolic CHF (congestive heart failure) ([place])    a. 10/2015 Echo: EF 20-25%, sev diast dysfxn, mildly reduced RV fxn. Nl MV prosthesis; b. 12/2017 Echo: EF 20-25%, diff HK. Sev ant/antsept HK, apical AK. Mild MS (mean graad 61mmHg). Mod TR. PASP 16mmHg.  [name] Coronary artery disease    a. 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  . Essential hypertension   . Ischemic cardiomyopathy    a. 03/2015 s/p MDT single lead AICD;  b. 10/2015 Echo: EF 20-25%;  c. 12/2017 Echo: EF 20-25%, diff HK.  . Mass of upper lobe of right lung    a. noted on CXR & CT 08/2017 w/ abnl PET CT.  [name] Mitral valve disease    a. 2006 s/p MVR w/ 11mm SJM bileaflet mechanical valve-->chronic coumadin/ASA; b. 12/2017 Echo: Mild MS. Mean gradient 70mmHg.   [name] Personal history of radiation therapy    f/u lung ca    Past Surgical History:  Procedure Laterality Date  . APPENDECTOMY  1998  . BREAST EXCISIONAL BIOPSY Left 80s   benign  . CARDIAC CATHETERIZATION    . CARDIAC DEFIBRILLATOR PLACEMENT    . COLONOSCOPY WITH PROPOFOL N/A 01/11/2020   Procedure: COLONOSCOPY WITH PROPOFOL;  Surgeon: Jonathon Bellows, MD;  Location: [facility] ENDOSCOPY;  Service: Gastroenterology;  Laterality: N/A;  . RADICAL HYSTERECTOMY  1998  . SHOULDER SURGERY Bilateral    rotator cuff tears  . VALVE REPLACEMENT     Mitral valve; 27 mm St. Jude bileaflet valve    There were no vitals filed for this visit.   Subjective Assessment - 01/15/20 0823    Subjective  Patient reports increased L sided neck pain that began from insdious onset 3 weeks prior. Patient states she woke up with the increased L sided neck pain which was relentless 9/10 pain for 3-4 days then noticed the pain decreased afterwards gradually. Patient reports she continues to have episodic pain which she is unable to relate any particular movements to other than lifting boxes at work, but feels it at other times as well. Patient reports the pain at times, radiates down the L UE but only to the upper arm but this  does not happen frequently. Patient reports she has just become cancer free of lung cancer recently and is excited by this fact.    Pertinent History  Hx of CA, insidious onset of L sided neck pain 3 weeks prior, B rotator cuff repair  surgery (unknown level of severity), MI    Limitations  Lifting    Diagnostic tests  none    Patient Stated Goals  To decrease pain perform activities with less pain    Currently in Pain?  No/denies    Pain Score  0-No pain   worst pain in past week 5/10   Pain Location  Neck    Pain Orientation  Left    Pain Descriptors / Indicators  Aching    Pain Type  Acute pain    Pain Onset  1 to 4 weeks ago    Pain Frequency  Intermittent         [facility] PT Assessment -  01/15/20 1105      Assessment   Medical Diagnosis  neck pain    Referring Provider (PT)  Bacigalupo MD    Onset Date/Surgical Date  12/25/19    Hand Dominance  Right    Next MD Visit  unknown    Prior Therapy  yes - [facility]      Balance Screen   Has the patient fallen in the past 6 months  No    Has the patient had a decrease in activity level because of a fear of falling?   No    Is the patient reluctant to leave their home because of a fear of falling?   No      Home Environment   Living Environment  Private residence    Living Arrangements  Spouse/significant other      Prior Function   Level of Independence  Independent    Vocation  Part time employment;On disability    Cytogeneticist    Leisure  Time with grandchildren      Cognition   Overall Cognitive Status  Within Functional Limits for tasks assessed      Observation/Other Assessments   Focus on Therapeutic Outcomes (FOTO)   48      Sensation   Additional Comments  Lightly deminished C6-C8 on the R side      Posture/Postural Control   Posture Comments  FHP, Increased thoracic kyphosis      ROM / Strength   AROM / PROM / Strength  AROM;Strength      AROM   AROM Assessment Site  Thoracic;Cervical;Shoulder    Right/Left Shoulder  --   WNL    Cervical Flexion  WNL    Cervical Extension  25% limited    Cervical - Right Side Bend  25% limited    Cervical - Left Side Bend  WNL    Cervical - Right Rotation  WNL    Cervical - Left Rotation  25% limited    Thoracic Flexion  WNL    Thoracic Extension  80% limited      Strength   Strength Assessment Site  Shoulder;Cervical    Right/Left Shoulder  Right;Left    Right Shoulder Flexion  5/5    Right Shoulder ABduction  5/5    Right Shoulder Internal Rotation  5/5    Right Shoulder External Rotation  5/5    Left Shoulder Flexion  4+/5    Left Shoulder ABduction  4+/5    Left Shoulder Internal Rotation  5/5  Left Shoulder External Rotation  5/5     Cervical Flexion  5/5    Cervical Extension  5/5    Cervical - Right Side Bend  5/5    Cervical - Left Side Bend  5/5      Palpation   Spinal mobility  Hypomobility Thoracic :T2-4    Palpation comment  TTP: UT and cervical extensors along the L side      Special Tests    Special Tests  Cervical    Cervical Tests  Dictraction;Spurling's      Spurling's   Findings  Negative    Side  Left      Distraction Test   Findngs  Positive    side  Left         Objective measurements completed on examination: See above findings.    TREATMENT Therapeutic Exercise: Cervical retraction in sitting -- x 20  Scapular retraction in sitting -- x 20  SNAG cervical extension in sitting -- x 20   Performed exercises to decrease pain and spasms           PT Education - 01/15/20 0853    Education Details  POC; form/technique with exercise    Person(s) Educated  Patient    Methods  Explanation;Demonstration;Handout    Comprehension  Verbalized understanding;Returned demonstration       PT Short Term Goals - 01/15/20 0859      PT SHORT TERM GOAL #1   Title  Patient will be independent with HEP to continue benefits of therapy until after discharge.    Baseline  Dependent for form/technique    Time  6    Period  Weeks    Status  New    Target Date  01/29/20        PT Long Term Goals - 01/15/20 0900      PT LONG TERM GOAL #1   Title  Patient will have a worst pain of a 1/10 to indicate significant improvement in pain and spasms most notably with lifting.    Baseline  5/10    Time  6    Period  Weeks    Status  New    Target Date  02/26/20      PT LONG TERM GOAL #2   Title  Patient will improve her FOTO score to 69 to indicate improvement pain and neck pain and allow for ability to lift with overall less pain    Baseline  48    Time  6    Period  Weeks    Status  New      PT LONG TERM GOAL #3   Title  Patient will be able to lift a box to chest height to allow  for improvement of lifitng for work related activities without increase in pain    Baseline  unable to perform without increase in pain    Time  6    Period  Weeks    Status  New    Target Date  02/26/20             Plan - 01/15/20 0854    Clinical Impression Statement  Patient is a 59 yo right hand dominant female presenting with increased L sided neck pain from insidious onset 3 weeks prior. Patient demosntrates L sided cervical dysfunction as indicated by difficulty with lifting items and performing ipsilateral cervical rotation and contralateral lateral cervical bending without increased in pain. Patient's pain consistent with limitations and irritation  along the L UT and L cervical extensors. Will focus on improving these limitations throughout therapy. Patient demonstrates some decreased musuclar strength along the shoulder musuclar most notably in abduction and flexion along the L side. Patient will benefit from furhter skilled therapy to return to prior level of function.    Personal Factors and Comorbidities  Age;Comorbidity 3+    Comorbidities  MI, hx of CA, B RCR surgery    Examination-Activity Limitations  Lift;Carry    Examination-Participation Restrictions  Community Activity;Other    Stability/Clinical Decision Making  Stable/Uncomplicated    Clinical Decision Making  Low    Rehab Potential  Good    PT Frequency  2x / week    PT Duration  6 weeks    PT Treatment/Interventions  Electrical Stimulation;Cryotherapy;Canalith Repostioning;Moist Heat;Therapeutic exercise;Neuromuscular re-education;Patient/family education;Manual techniques;Dry needling;Passive range of motion;Joint Manipulations    PT Next Visit Plan  progress exercise, mt to decrease muscular spasms    PT Home Exercise Plan  cervical/scapular retraction, SNAG cerv ext    Consulted and Agree with Plan of Care  Patient       Patient will benefit from skilled therapeutic intervention in order to improve the  following deficits and impairments:  Pain, Decreased mobility, Impaired sensation, Increased muscle spasms, Postural dysfunction, Decreased endurance, Decreased range of motion, Decreased strength, Hypomobility, Decreased activity tolerance  Visit Diagnosis: Cervicalgia  Cramp and spasm     Problem List Patient Active Problem List   Diagnosis Date Noted  . 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 Neck muscle spasm 12/14/2019  . Myocardiopathy ([place]) 08/18/2018  . Valvular heart disease 08/18/2018  . Lung cancer ([place]) 05/31/2018  . Hyperlipidemia LDL goal <70 03/22/2018  . Morbid obesity ([place]) 12/22/2017  . Insomnia 12/07/2017  . OA (osteoarthritis) of knee 07/08/2017  . Chronic anticoagulation 07/08/2017  . Paroxysmal atrial fibrillation ([facility])   . Coronary artery disease 02/24/2017  . Ischemic cardiomyopathy 02/24/2017  . Rheumatic heart disease 02/24/2017  . Status post mitral valve replacement 02/24/2017  . Chronic systolic heart failure ([place]) 01/02/2017  . HTN (hypertension) 01/02/2017    Blythe Stanford, PT DPT 01/15/2020, 12:59 PM  [place] [facility] [address], [place], [address] Phone: [phone]   Fax:  [phone]  Name: Vanessa Carlson MRN: [id number] Date of Birth: 08-21-1961

## 2020-01-17 ENCOUNTER — Other Ambulatory Visit: Payer: Self-pay

## 2020-01-17 ENCOUNTER — Ambulatory Visit: Payer: Medicare Other

## 2020-01-17 DIAGNOSIS — M542 Cervicalgia: Secondary | ICD-10-CM

## 2020-01-17 DIAGNOSIS — R252 Cramp and spasm: Secondary | ICD-10-CM

## 2020-01-17 LAB — CUP PACEART INCLINIC DEVICE CHECK
Battery Remaining Longevity: 94 mo
Battery Voltage: 3 V
Brady Statistic RV Percent Paced: 0.01 %
Date Time Interrogation Session: 20210511112000
HighPow Impedance: 75 Ohm
Implantable Lead Implant Date: 20160802
Implantable Lead Location: 753860
Implantable Pulse Generator Implant Date: 20160802
Lead Channel Impedance Value: 570 Ohm
Lead Channel Impedance Value: 665 Ohm
Lead Channel Pacing Threshold Amplitude: 0.75 V
Lead Channel Pacing Threshold Pulse Width: 0.4 ms
Lead Channel Sensing Intrinsic Amplitude: 17.125 mV
Lead Channel Setting Pacing Amplitude: 2 V
Lead Channel Setting Pacing Pulse Width: 0.4 ms
Lead Channel Setting Sensing Sensitivity: 0.3 mV

## 2020-01-17 NOTE — Therapy (Signed)
[place] [facility] [address], [place], [address] Phone: [phone]   Fax:  [phone]  Physical Therapy Treatment  Patient Details  Name: Vanessa Carlson MRN: [id number] Date of Birth: 1961/01/09 Referring Provider (PT): Brita Romp MD   Encounter Date: 01/17/2020  PT End of Session - 01/17/20 0737    Visit Number  2    Number of Visits  13    Date for PT Re-Evaluation  02/26/20    PT Start Time  0730    PT Stop Time  0815    PT Time Calculation (min)  45 min    Activity Tolerance  Patient tolerated treatment well    Behavior During Therapy  [facility] for tasks assessed/performed       Past Medical History:  Diagnosis Date  . AICD (automatic cardioverter/defibrillator) present   . Anterior myocardial infarction [facility])    a. 10/2014 - occluded LAD, complicated by cardiogenic shock-->Med Rx as interventional team was unable to open LAD.  [name] Cancer of upper lobe of right lung ([place]) 09/2017   radiation therapy right side  . Cervical cancer ([place])    in remission for ~20 yrs, s/p hysterectomy  . Chronic combined systolic and diastolic CHF (congestive heart failure) ([place])    a. 10/2015 Echo: EF 20-25%, sev diast dysfxn, mildly reduced RV fxn. Nl MV prosthesis; b. 12/2017 Echo: EF 20-25%, diff HK. Sev ant/antsept HK, apical AK. Mild MS (mean graad 9mmHg). Mod TR. PASP 91mmHg.  [name] Coronary artery disease    a. 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  . Essential hypertension   . Ischemic cardiomyopathy    a. 03/2015 s/p MDT single lead AICD;  b. 10/2015 Echo: EF 20-25%;  c. 12/2017 Echo: EF 20-25%, diff HK.  . Mass of upper lobe of right lung    a. noted on CXR & CT 08/2017 w/ abnl PET CT.  [name] Mitral valve disease    a. 2006 s/p MVR w/ 33mm SJM bileaflet mechanical valve-->chronic coumadin/ASA; b. 12/2017 Echo: Mild MS. Mean gradient 52mmHg.   [name] Personal history of radiation therapy    f/u lung ca    Past Surgical History:  Procedure Laterality Date  . APPENDECTOMY  1998  . BREAST EXCISIONAL BIOPSY Left 80s   benign  . CARDIAC CATHETERIZATION    . CARDIAC DEFIBRILLATOR PLACEMENT    . COLONOSCOPY WITH PROPOFOL N/A 01/11/2020   Procedure: COLONOSCOPY WITH PROPOFOL;  Surgeon: Jonathon Bellows, MD;  Location: [facility] ENDOSCOPY;  Service: Gastroenterology;  Laterality: N/A;  . RADICAL HYSTERECTOMY  1998  . SHOULDER SURGERY Bilateral    rotator cuff tears  . VALVE REPLACEMENT     Mitral valve; 27 mm St. Jude bileaflet valve    There were no vitals filed for this visit.  Subjective Assessment - 01/17/20 0733    Subjective  Patient states she is in no pain currently. She reports she had pain yesterday in which she did her HEP which helped significantly.    Pertinent History  Hx of CA, insidious onset of L sided neck pain 3 weeks prior, B rotator cuff repair  surgery (unknown level of severity), MI    Limitations  Lifting    Diagnostic tests  none    Patient Stated Goals  To decrease pain perform activities with less pain    Currently in Pain?  No/denies    Pain Onset  1 to 4 weeks ago          TREATMENT Therapeutic Exercise Shoulder rows with YTB - x 25 in standing Sitting cervical extension SNAG with towel - x 20  Cervical retraction in sitting - x 20  Shoulder extension in standing with YTB - x 20  Cervical side bending isometrics in sitting -x 20  Push up PLUS - x 10 at wall with 2 sec holds  Performed exercises to improve mobility/coordination and strength  Manual therapy STM performed to the cervical extensors in sitting to decrease increased spasms and pain along the L side of the cervical spine  PT Education - 01/17/20 0736    Education Details  form/technique with exercise    Person(s) Educated  Patient    Methods  Explanation;Demonstration    Comprehension  Verbalized understanding;Returned demonstration        PT Short Term Goals - 01/15/20 0859      PT SHORT TERM GOAL #1   Title  Patient will be independent with HEP to continue benefits of therapy until after discharge.    Baseline  Dependent for form/technique    Time  6    Period  Weeks    Status  New    Target Date  01/29/20        PT Long Term Goals - 01/15/20 0900      PT LONG TERM GOAL #1   Title  Patient will have a worst pain of a 1/10 to indicate significant improvement in pain and spasms most notably with lifting.    Baseline  5/10    Time  6    Period  Weeks    Status  New    Target Date  02/26/20      PT LONG TERM GOAL #2   Title  Patient will improve her FOTO score to 69 to indicate improvement pain and neck pain and allow for ability to lift with overall less pain    Baseline  48    Time  6    Period  Weeks    Status  New      PT LONG TERM GOAL #3   Title  Patient will be able to lift a box to chest height to allow for improvement of lifitng for work related activities without increase in pain    Baseline  unable to perform without increase in pain    Time  6    Period  Weeks    Status  New    Target Date  02/26/20            Plan - 01/17/20 0741    Clinical Impression Statement  Patient demonstrates improvement overall with coordination requiring less cueing to perform exercises compared to the previous session. Patient demonstrates slight increased in UT activation and has increased pain with use of the UT musculature and will benedit from futher skilled therapy to return to prior level of function.    Personal Factors and Comorbidities  Age;Comorbidity 3+    Comorbidities  MI, hx of CA, B RCR surgery    Examination-Activity Limitations  Lift;Carry    Examination-Participation Restrictions  Community Activity;Other    Stability/Clinical Decision Making  Stable/Uncomplicated    Rehab Potential  Good    PT Frequency  2x / week    PT Duration  6 weeks    PT Treatment/Interventions  Electrical  Stimulation;Cryotherapy;Canalith Repostioning;Moist Heat;Therapeutic exercise;Neuromuscular re-education;Patient/family education;Manual techniques;Dry needling;Passive range of motion;Joint Manipulations  PT Next Visit Plan  progress exercise, mt to decrease muscular spasms    PT Home Exercise Plan  cervical/scapular retraction, SNAG cerv ext    Consulted and Agree with Plan of Care  Patient       Patient will benefit from skilled therapeutic intervention in order to improve the following deficits and impairments:  Pain, Decreased mobility, Impaired sensation, Increased muscle spasms, Postural dysfunction, Decreased endurance, Decreased range of motion, Decreased strength, Hypomobility, Decreased activity tolerance  Visit Diagnosis: Cervicalgia  Cramp and spasm     Problem List Patient Active Problem List   Diagnosis Date Noted  . 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 Neck muscle spasm 12/14/2019  . Myocardiopathy ([place]) 08/18/2018  . Valvular heart disease 08/18/2018  . Lung cancer ([place]) 05/31/2018  . Hyperlipidemia LDL goal <70 03/22/2018  . Morbid obesity ([place]) 12/22/2017  . Insomnia 12/07/2017  . OA (osteoarthritis) of knee 07/08/2017  . Chronic anticoagulation 07/08/2017  . Paroxysmal atrial fibrillation ([facility])   . Coronary artery disease 02/24/2017  . Ischemic cardiomyopathy 02/24/2017  . Rheumatic heart disease 02/24/2017  . Status post mitral valve replacement 02/24/2017  . Chronic systolic heart failure ([place]) 01/02/2017  . HTN (hypertension) 01/02/2017    Blythe Stanford, PT DPT 01/17/2020, 8:10 AM  [place] [facility] [address], [place], [address] Phone: [phone]   Fax:  [phone]  Name: Vanessa Carlson MRN: [id number] Date of Birth: 1961-08-18

## 2020-01-22 ENCOUNTER — Other Ambulatory Visit: Payer: Self-pay

## 2020-01-22 ENCOUNTER — Ambulatory Visit (INDEPENDENT_AMBULATORY_CARE_PROVIDER_SITE_OTHER): Payer: Medicare Other

## 2020-01-22 ENCOUNTER — Ambulatory Visit: Payer: Medicare Other

## 2020-01-22 DIAGNOSIS — I480 Paroxysmal atrial fibrillation: Secondary | ICD-10-CM | POA: Diagnosis not present

## 2020-01-22 DIAGNOSIS — Z952 Presence of prosthetic heart valve: Secondary | ICD-10-CM | POA: Diagnosis not present

## 2020-01-22 DIAGNOSIS — R252 Cramp and spasm: Secondary | ICD-10-CM

## 2020-01-22 DIAGNOSIS — M542 Cervicalgia: Secondary | ICD-10-CM | POA: Diagnosis not present

## 2020-01-22 LAB — POCT INR
INR: 1.2 — AB (ref 2.0–3.0)
PT: 14.7

## 2020-01-22 MED ORDER — METOPROLOL SUCCINATE ER 50 MG PO TB24: ORAL_TABLET | ORAL | 1 refills | Status: DC

## 2020-01-22 NOTE — Patient Instructions (Signed)
Description   Current Dose: 7.5mg  daily except 10mg  T,TH,Sat Changes made: Increase to 10mg  daily except 7.5mg  Wednesday and Sunday F/U 2 weeks

## 2020-01-22 NOTE — Telephone Encounter (Signed)
*  STAT* If patient is at the pharmacy, call can be transferred to refill team.   1. Which medications need to be refilled? (please list name of each medication and dose if known) Metoprolol  2. Which pharmacy/location (including street and city if local pharmacy) is medication to be sent to? [facility] [place]  3. Do they need a 30 day or 90 day supply? [place]

## 2020-01-22 NOTE — Therapy (Signed)
[place] [facility] [address] [place], [address] Phone: [phone]   Fax:  [phone]  Physical Therapy Treatment  Patient Details  Name: Vanessa Carlson MRN: [id number] Date of Birth: 1961-07-13 Referring Provider (PT): Brita Romp MD   Encounter Date: 01/22/2020  PT End of Session - 01/22/20 0827    Visit Number  3    Number of Visits  13    Date for PT Re-Evaluation  02/26/20    PT Start Time  0815    PT Stop Time  0900    PT Time Calculation (min)  45 min    Activity Tolerance  Patient tolerated treatment well    Behavior During Therapy  [facility] for tasks assessed/performed       Past Medical History:  Diagnosis Date  . AICD (automatic cardioverter/defibrillator) present   . Anterior myocardial infarction [facility])    a. 10/2014 - occluded LAD, complicated by cardiogenic shock-->Med Rx as interventional team was unable to open LAD.  [name] Cancer of upper lobe of right lung ([place]) 09/2017   radiation therapy right side  . Cervical cancer ([place])    in remission for ~20 yrs, s/p hysterectomy  . Chronic combined systolic and diastolic CHF (congestive heart failure) ([place])    a. 10/2015 Echo: EF 20-25%, sev diast dysfxn, mildly reduced RV fxn. Nl MV prosthesis; b. 12/2017 Echo: EF 20-25%, diff HK. Sev ant/antsept HK, apical AK. Mild MS (mean graad 53mmHg). Mod TR. PASP 84mmHg.  [name] Coronary artery disease    a. 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  . Essential hypertension   . Ischemic cardiomyopathy    a. 03/2015 s/p MDT single lead AICD;  b. 10/2015 Echo: EF 20-25%;  c. 12/2017 Echo: EF 20-25%, diff HK.  . Mass of upper lobe of right lung    a. noted on CXR & CT 08/2017 w/ abnl PET CT.  [name] Mitral valve disease    a. 2006 s/p MVR w/ 41mm SJM bileaflet mechanical valve-->chronic coumadin/ASA; b. 12/2017 Echo: Mild MS. Mean gradient 51mmHg.   [name] Personal history of radiation therapy    f/u lung ca    Past Surgical History:  Procedure Laterality Date  . APPENDECTOMY  1998  . BREAST EXCISIONAL BIOPSY Left 80s   benign  . CARDIAC CATHETERIZATION    . CARDIAC DEFIBRILLATOR PLACEMENT    . COLONOSCOPY WITH PROPOFOL N/A 01/11/2020   Procedure: COLONOSCOPY WITH PROPOFOL;  Surgeon: Jonathon Bellows, MD;  Location: [facility] ENDOSCOPY;  Service: Gastroenterology;  Laterality: N/A;  . RADICAL HYSTERECTOMY  1998  . SHOULDER SURGERY Bilateral    rotator cuff tears  . VALVE REPLACEMENT     Mitral valve; 27 mm St. Jude bileaflet valve    There were no vitals filed for this visit.  Subjective Assessment - 01/22/20 0821    Subjective  Patient reports she her neck as been feeling better. However states her knee has been bothering her recently.    Pertinent History  Hx of CA, insidious onset of L sided neck pain 3 weeks prior, B rotator cuff repair  surgery (unknown level of severity), MI    Limitations  Lifting    Diagnostic tests  none    Patient Stated Goals  To decrease pain perform activities with less pain    Currently in Pain?  No/denies    Pain Onset  1 to 4 weeks  ago               TREATMENT Therapeutic Exercise Shoulder rows with YTB - x 25 in standing Cervical retraction in sitting - x 20  B shoulder ER with YTB - x 20  Shoulder extension in standing with YTB - x 20  Wall angels at wall - x20 Push up PLUS - x 10 at wall with 2 sec holds   Performed exercises to improve mobility/coordination and strength   Manual therapy STM performed to the cervical extensors in sitting to decrease increased spasms and pain along the L side of the cervical spine      PT Education - 01/22/20 0827    Education Details  form/technique with exercise    Person(s) Educated  Patient    Methods  Explanation;Demonstration    Comprehension  Verbalized understanding;Returned demonstration       PT Short Term Goals - 01/15/20 0859       PT SHORT TERM GOAL #1   Title  Patient will be independent with HEP to continue benefits of therapy until after discharge.    Baseline  Dependent for form/technique    Time  6    Period  Weeks    Status  New    Target Date  01/29/20        PT Long Term Goals - 01/15/20 0900      PT LONG TERM GOAL #1   Title  Patient will have a worst pain of a 1/10 to indicate significant improvement in pain and spasms most notably with lifting.    Baseline  5/10    Time  6    Period  Weeks    Status  New    Target Date  02/26/20      PT LONG TERM GOAL #2   Title  Patient will improve her FOTO score to 69 to indicate improvement pain and neck pain and allow for ability to lift with overall less pain    Baseline  48    Time  6    Period  Weeks    Status  New      PT LONG TERM GOAL #3   Title  Patient will be able to lift a box to chest height to allow for improvement of lifitng for work related activities without increase in pain    Baseline  unable to perform without increase in pain    Time  6    Period  Weeks    Status  New    Target Date  02/26/20            Plan - 01/22/20 0838    Clinical Impression Statement  COnitnued to focus on improving motor control along the scapulae and began integrating overhead motions to improve pain and spasms. Patient demonstrates minimal UT activation which is improved with cueing to the affected side. Patient demonstrates decreased muscular endurance as noted by increased difficulty with performing overhead motions and patient will benefit from further skilled therapy o treturn to prior level of function.    Personal Factors and Comorbidities  Age;Comorbidity 3+    Comorbidities  MI, hx of CA, B RCR surgery    Examination-Activity Limitations  Lift;Carry    Examination-Participation Restrictions  Community Activity;Other    Stability/Clinical Decision Making  Stable/Uncomplicated    Rehab Potential  Good    PT Frequency  2x / week    PT Duration   6 weeks    PT  Treatment/Interventions  Electrical Stimulation;Cryotherapy;Canalith Repostioning;Moist Heat;Therapeutic exercise;Neuromuscular re-education;Patient/family education;Manual techniques;Dry needling;Passive range of motion;Joint Manipulations    PT Next Visit Plan  progress exercise, mt to decrease muscular spasms    PT Home Exercise Plan  cervical/scapular retraction, SNAG cerv ext    Consulted and Agree with Plan of Care  Patient       Patient will benefit from skilled therapeutic intervention in order to improve the following deficits and impairments:  Pain, Decreased mobility, Impaired sensation, Increased muscle spasms, Postural dysfunction, Decreased endurance, Decreased range of motion, Decreased strength, Hypomobility, Decreased activity tolerance  Visit Diagnosis: Cramp and spasm  Cervicalgia     Problem List Patient Active Problem List   Diagnosis Date Noted  . 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 Neck muscle spasm 12/14/2019  . Myocardiopathy ([place]) 08/18/2018  . Valvular heart disease 08/18/2018  . Lung cancer ([place]) 05/31/2018  . Hyperlipidemia LDL goal <70 03/22/2018  . Morbid obesity ([place]) 12/22/2017  . Insomnia 12/07/2017  . OA (osteoarthritis) of knee 07/08/2017  . Chronic anticoagulation 07/08/2017  . Paroxysmal atrial fibrillation ([facility])   . Coronary artery disease 02/24/2017  . Ischemic cardiomyopathy 02/24/2017  . Rheumatic heart disease 02/24/2017  . Status post mitral valve replacement 02/24/2017  . Chronic systolic heart failure ([place]) 01/02/2017  . HTN (hypertension) 01/02/2017    Blythe Stanford, PT DPT 01/22/2020, 9:01 AM  [place] [facility] [address], [place], [address] Phone: [phone]   Fax:  [phone]  Name: Vanessa Carlson MRN: [id number] Date of Birth: 10/20/1960

## 2020-01-24 ENCOUNTER — Ambulatory Visit: Payer: Medicare Other

## 2020-01-24 ENCOUNTER — Other Ambulatory Visit: Payer: Self-pay

## 2020-01-24 DIAGNOSIS — R252 Cramp and spasm: Secondary | ICD-10-CM | POA: Diagnosis not present

## 2020-01-24 DIAGNOSIS — M542 Cervicalgia: Secondary | ICD-10-CM | POA: Diagnosis not present

## 2020-01-24 NOTE — Therapy (Signed)
[place] [facility] [address], [place], [address] Phone: [phone]   Fax:  [phone]  Physical Therapy Treatment  Patient Details  Name: Vanessa Carlson MRN: [id number] Date of Birth: 1961/02/28 Referring Provider (PT): Brita Romp MD   Encounter Date: 01/24/2020  PT End of Session - 01/24/20 0739    Visit Number  4    Number of Visits  13    Date for PT Re-Evaluation  02/26/20    PT Start Time  0734    PT Stop Time  0815    PT Time Calculation (min)  41 min    Activity Tolerance  Patient tolerated treatment well    Behavior During Therapy  [facility] for tasks assessed/performed       Past Medical History:  Diagnosis Date  . AICD (automatic cardioverter/defibrillator) present   . Anterior myocardial infarction [facility])    a. 10/2014 - occluded LAD, complicated by cardiogenic shock-->Med Rx as interventional team was unable to open LAD.  [name] Cancer of upper lobe of right lung ([place]) 09/2017   radiation therapy right side  . Cervical cancer ([place])    in remission for ~20 yrs, s/p hysterectomy  . Chronic combined systolic and diastolic CHF (congestive heart failure) ([place])    a. 10/2015 Echo: EF 20-25%, sev diast dysfxn, mildly reduced RV fxn. Nl MV prosthesis; b. 12/2017 Echo: EF 20-25%, diff HK. Sev ant/antsept HK, apical AK. Mild MS (mean graad 32mmHg). Mod TR. PASP 62mmHg.  [name] Coronary artery disease    a. 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  . Essential hypertension   . Ischemic cardiomyopathy    a. 03/2015 s/p MDT single lead AICD;  b. 10/2015 Echo: EF 20-25%;  c. 12/2017 Echo: EF 20-25%, diff HK.  . Mass of upper lobe of right lung    a. noted on CXR & CT 08/2017 w/ abnl PET CT.  [name] Mitral valve disease    a. 2006 s/p MVR w/ 42mm SJM bileaflet mechanical valve-->chronic coumadin/ASA; b. 12/2017 Echo: Mild MS. Mean gradient 66mmHg.   [name] Personal history of radiation therapy    f/u lung ca    Past Surgical History:  Procedure Laterality Date  . APPENDECTOMY  1998  . BREAST EXCISIONAL BIOPSY Left 80s   benign  . CARDIAC CATHETERIZATION    . CARDIAC DEFIBRILLATOR PLACEMENT    . COLONOSCOPY WITH PROPOFOL N/A 01/11/2020   Procedure: COLONOSCOPY WITH PROPOFOL;  Surgeon: Jonathon Bellows, MD;  Location: [facility] ENDOSCOPY;  Service: Gastroenterology;  Laterality: N/A;  . RADICAL HYSTERECTOMY  1998  . SHOULDER SURGERY Bilateral    rotator cuff tears  . VALVE REPLACEMENT     Mitral valve; 27 mm St. Jude bileaflet valve    There were no vitals filed for this visit.  Subjective Assessment - 01/24/20 0736    Subjective  Patient states she was sore after the previous session and after having to clean a chair. Patient states no pain currently.    Pertinent History  Hx of CA, insidious onset of L sided neck pain 3 weeks prior, B rotator cuff repair  surgery (unknown level of severity), MI    Limitations  Lifting    Diagnostic tests  none    Patient Stated Goals  To decrease pain perform activities with less pain    Currently in Pain?  No/denies    Pain Onset  1  to 4 weeks ago        TREATMENT Therapeutic Exercise Shoulder rows with RTB - x 25 in standing B shoulder ER with RTB - x 25 Overhead shoulder flexion in standing - x 20 with yellow physioball Shoulder extension in standing with RTB - x 20  Shoulder adduction with RTB with - x 10  Cervical retraction in sitting with adding scapular retraction - x 20  Thoracic extension with towel around upper thoracic spine - x 20  Performed exercises to improve mobility/coordination and strength   Manual therapy STM performed to the cervical extensors in sitting to decrease increased spasms and pain along the L side of the cervical spine    PT Education - 01/24/20 0739    Education Details  form/technique with exercise    Person(s) Educated  Patient    Methods   Explanation;Demonstration    Comprehension  Verbalized understanding;Returned demonstration       PT Short Term Goals - 01/15/20 0859      PT SHORT TERM GOAL #1   Title  Patient will be independent with HEP to continue benefits of therapy until after discharge.    Baseline  Dependent for form/technique    Time  6    Period  Weeks    Status  New    Target Date  01/29/20        PT Long Term Goals - 01/15/20 0900      PT LONG TERM GOAL #1   Title  Patient will have a worst pain of a 1/10 to indicate significant improvement in pain and spasms most notably with lifting.    Baseline  5/10    Time  6    Period  Weeks    Status  New    Target Date  02/26/20      PT LONG TERM GOAL #2   Title  Patient will improve her FOTO score to 69 to indicate improvement pain and neck pain and allow for ability to lift with overall less pain    Baseline  48    Time  6    Period  Weeks    Status  New      PT LONG TERM GOAL #3   Title  Patient will be able to lift a box to chest height to allow for improvement of lifitng for work related activities without increase in pain    Baseline  unable to perform without increase in pain    Time  6    Period  Weeks    Status  New    Target Date  02/26/20            Plan - 01/24/20 0759    Clinical Impression Statement  No increase in pain throughout the session with ability to progress to a red resistance band without much difficulty. Patient conitnues to have decreased retraction mobility for both cervical and scapular based movements. Will continue to progress this as therapy continues. Patient will benefit from further skilled therapy to return to prior level of function.    Personal Factors and Comorbidities  Age;Comorbidity 3+    Comorbidities  MI, hx of CA, B RCR surgery    Examination-Activity Limitations  Lift;Carry    Examination-Participation Restrictions  Community Activity;Other    Stability/Clinical Decision Making   Stable/Uncomplicated    Rehab Potential  Good    PT Frequency  2x / week    PT Duration  6 weeks  PT Treatment/Interventions  Electrical Stimulation;Cryotherapy;Canalith Repostioning;Moist Heat;Therapeutic exercise;Neuromuscular re-education;Patient/family education;Manual techniques;Dry needling;Passive range of motion;Joint Manipulations    PT Next Visit Plan  progress exercise, mt to decrease muscular spasms    PT Home Exercise Plan  cervical/scapular retraction, SNAG cerv ext    Consulted and Agree with Plan of Care  Patient       Patient will benefit from skilled therapeutic intervention in order to improve the following deficits and impairments:  Pain, Decreased mobility, Impaired sensation, Increased muscle spasms, Postural dysfunction, Decreased endurance, Decreased range of motion, Decreased strength, Hypomobility, Decreased activity tolerance  Visit Diagnosis: Cramp and spasm  Cervicalgia     Problem List Patient Active Problem List   Diagnosis Date Noted  . 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 Neck muscle spasm 12/14/2019  . Myocardiopathy ([place]) 08/18/2018  . Valvular heart disease 08/18/2018  . Lung cancer ([place]) 05/31/2018  . Hyperlipidemia LDL goal <70 03/22/2018  . Morbid obesity ([place]) 12/22/2017  . Insomnia 12/07/2017  . OA (osteoarthritis) of knee 07/08/2017  . Chronic anticoagulation 07/08/2017  . Paroxysmal atrial fibrillation ([facility])   . Coronary artery disease 02/24/2017  . Ischemic cardiomyopathy 02/24/2017  . Rheumatic heart disease 02/24/2017  . Status post mitral valve replacement 02/24/2017  . Chronic systolic heart failure ([place]) 01/02/2017  . HTN (hypertension) 01/02/2017    Blythe Stanford, PT DPT 01/24/2020, 8:11 AM  [place] [facility] [address] [place], [address] Phone: [phone]   Fax:  [phone]  Name: Vanessa Carlson MRN: [id number] Date of  Birth: 10/31/1960

## 2020-01-25 DIAGNOSIS — M1711 Unilateral primary osteoarthritis, right knee: Secondary | ICD-10-CM | POA: Diagnosis not present

## 2020-01-25 DIAGNOSIS — M25561 Pain in right knee: Secondary | ICD-10-CM | POA: Diagnosis not present

## 2020-01-30 ENCOUNTER — Other Ambulatory Visit: Payer: Self-pay

## 2020-01-30 ENCOUNTER — Ambulatory Visit: Payer: Medicare Other | Attending: Family Medicine

## 2020-01-30 DIAGNOSIS — R252 Cramp and spasm: Secondary | ICD-10-CM | POA: Diagnosis not present

## 2020-01-30 DIAGNOSIS — M542 Cervicalgia: Secondary | ICD-10-CM | POA: Diagnosis not present

## 2020-01-30 NOTE — Therapy (Signed)
[facility] [address], [place], [address] Phone: [phone]   Fax:  [phone]  Physical Therapy Treatment  Patient Details  Name: Vanessa Carlson MRN: [id number] Date of Birth: December 23, 1960 Referring Provider (PT): Brita Romp MD   Encounter Date: 01/30/2020  PT End of Session - 01/30/20 1308    Visit Number  5    Number of Visits  13    Date for PT Re-Evaluation  02/26/20    PT Start Time  1300    PT Stop Time  1345    PT Time Calculation (min)  45 min    Activity Tolerance  Patient tolerated treatment well    Behavior During Therapy  [facility] for tasks assessed/performed       Past Medical History:  Diagnosis Date  . AICD (automatic cardioverter/defibrillator) present   . Anterior myocardial infarction [facility])    a. 10/2014 - occluded LAD, complicated by cardiogenic shock-->Med Rx as interventional team was unable to open LAD.  [name] Cancer of upper lobe of right lung ([place]) 09/2017   radiation therapy right side  . Cervical cancer ([place])    in remission for ~20 yrs, s/p hysterectomy  . Chronic combined systolic and diastolic CHF (congestive heart failure) ([place])    a. 10/2015 Echo: EF 20-25%, sev diast dysfxn, mildly reduced RV fxn. Nl MV prosthesis; b. 12/2017 Echo: EF 20-25%, diff HK. Sev ant/antsept HK, apical AK. Mild MS (mean graad 9mHg). Mod TR. PASP 471mg.  . Marland Kitchenoronary artery disease    a. 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  . Essential hypertension   . Ischemic cardiomyopathy    a. 03/2015 s/p MDT single lead AICD;  b. 10/2015 Echo: EF 20-25%;  c. 12/2017 Echo: EF 20-25%, diff HK.  . Mass of upper lobe of right lung    a. noted on CXR & CT 08/2017 w/ abnl PET CT.  . Marland Kitchenitral valve disease    a. 2006 s/p MVR w/ 2762mJM bileaflet mechanical valve-->chronic coumadin/ASA; b. 12/2017 Echo: Mild MS. Mean gradient 4mm71m   . PeMarland Kitchensonal history of radiation therapy    f/u lung ca    Past Surgical History:  Procedure Laterality Date  . APPENDECTOMY  1998  . BREAST EXCISIONAL BIOPSY Left 80s   benign  . CARDIAC CATHETERIZATION    . CARDIAC DEFIBRILLATOR PLACEMENT    . COLONOSCOPY WITH PROPOFOL N/A 01/11/2020   Procedure: COLONOSCOPY WITH PROPOFOL;  Surgeon: AnnaJonathon Bellows;  [facility]  Service: Gastroenterology;  Laterality: N/A;  . RADICAL HYSTERECTOMY  1998  . SHOULDER SURGERY Bilateral    rotator cuff tears  . VALVE REPLACEMENT     Mitral valve; 27 mm St. Jude bileaflet valve    There were no vitals filed for this visit.  Subjective Assessment - 01/30/20 1305    Subjective  Patient states her neck felt good after the previous session. No increase in pain since the previous session.    Pertinent History  Hx of CA, insidious onset of L sided neck pain 3 weeks prior, B rotator cuff repair  surgery (unknown level of severity), MI    Limitations  Lifting    Diagnostic tests  none    Patient Stated Goals  To decrease pain perform activities with less pain    Currently in Pain?  No/denies    Pain Onset  1 to 4 weeks  ago         TREATMENT Therapeutic Exercise Shoulder rows with RTB - x 25 in standing B shoulder ER with RTB - x 25 Shoulder extension in standing with RTB - x 20  Shoulder ER to shoulder flexion with RTB - x 20  Military press with 5# weight - x 20  Archery in standing with RTB - x20   Performed exercises to improve mobility/coordination and strength   PT Education - 01/30/20 1307    Education Details  form/technique with exercise    Person(s) Educated  Patient    Methods  Explanation;Demonstration    Comprehension  Verbalized understanding;Returned demonstration       PT Short Term Goals - 01/30/20 1349      PT SHORT TERM GOAL #1   Title  Patient will be independent with HEP to continue benefits of therapy until after discharge.    Baseline  Dependent for  form/technique; 01/30/2020: independent with HEP    Time  6    Period  Weeks    Status  Achieved    Target Date  01/29/20        PT Long Term Goals - 01/30/20 1310      PT LONG TERM GOAL #1   Title  Patient will have a worst pain of a 1/10 to indicate significant improvement in pain and spasms most notably with lifting.    Baseline  5/10; 01/30/2020: 2/10    Time  6    Period  Weeks    Status  Achieved      PT LONG TERM GOAL #2   Title  Patient will improve her FOTO score to 69 to indicate improvement pain and neck pain and allow for ability to lift with overall less pain    Baseline  48; 01/30/2020: 67    Time  6    Period  Weeks    Status  Achieved      PT LONG TERM GOAL #3   Title  Patient will be able to lift a box to chest height to allow for improvement of lifitng for work related activities without increase in pain    Baseline  unable to perform without increase in pain;01/30/2020: able to perform without difficulty    Time  6    Period  Weeks    Status  Achieved            Plan - 01/30/20 1352    Clinical Impression Statement  Patient has met or partially met all long term goals is able to lift items overhead and perform all exercises without need of assitance for cueing. Patient has met all long term goals and is to be discharged from neck PT to be seen for knee PT per another order.    Personal Factors and Comorbidities  Age;Comorbidity 3+    Comorbidities  MI, hx of CA, B RCR surgery    Examination-Activity Limitations  Lift;Carry    Examination-Participation Restrictions  Community Activity;Other    Stability/Clinical Decision Making  Stable/Uncomplicated    Rehab Potential  Good    PT Frequency  2x / week    PT Duration  6 weeks    PT Treatment/Interventions  Electrical Stimulation;Cryotherapy;Canalith Repostioning;Moist Heat;Therapeutic exercise;Neuromuscular re-education;Patient/family education;Manual techniques;Dry needling;Passive range of motion;Joint  Manipulations    PT Next Visit Plan  progress exercise, mt to decrease muscular spasms    PT Home Exercise Plan  cervical/scapular retraction, SNAG cerv ext    Consulted and Agree  with Plan of Care  Patient       Patient will benefit from skilled therapeutic intervention in order to improve the following deficits and impairments:  Pain, Decreased mobility, Impaired sensation, Increased muscle spasms, Postural dysfunction, Decreased endurance, Decreased range of motion, Decreased strength, Hypomobility, Decreased activity tolerance  Visit Diagnosis: Cramp and spasm  Cervicalgia     Problem List Patient Active Problem List   Diagnosis Date Noted  . 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 Neck muscle spasm 12/14/2019  . Myocardiopathy ([place]) 08/18/2018  . Valvular heart disease 08/18/2018  . Lung cancer ([place]) 05/31/2018  . Hyperlipidemia LDL goal <70 03/22/2018  . Morbid obesity ([place]) 12/22/2017  . Insomnia 12/07/2017  . OA (osteoarthritis) of knee 07/08/2017  . Chronic anticoagulation 07/08/2017  . Paroxysmal atrial fibrillation ([facility])   . Coronary artery disease 02/24/2017  . Ischemic cardiomyopathy 02/24/2017  . Rheumatic heart disease 02/24/2017  . Status post mitral valve replacement 02/24/2017  . Chronic systolic heart failure ([place]) 01/02/2017  . HTN (hypertension) 01/02/2017    Blythe Stanford, PT DPT  01/30/2020, 1:55 PM  [facility] [address], [place], [address] Phone: [phone]   Fax:  [phone]  Name: Vanessa Carlson MRN: [id number] Date of Birth: 12-14-1960

## 2020-02-01 ENCOUNTER — Other Ambulatory Visit: Payer: Self-pay | Admitting: Family

## 2020-02-05 ENCOUNTER — Ambulatory Visit: Payer: Medicare Other

## 2020-02-06 ENCOUNTER — Encounter: Payer: Self-pay | Admitting: Family Medicine

## 2020-02-07 ENCOUNTER — Other Ambulatory Visit: Payer: Self-pay | Admitting: Family

## 2020-02-07 ENCOUNTER — Other Ambulatory Visit: Payer: Self-pay

## 2020-02-07 MED ORDER — SACUBITRIL-VALSARTAN 24-26 MG PO TABS: 1.0000 | ORAL_TABLET | Freq: Two times a day (BID) | ORAL | 3 refills | Status: DC

## 2020-02-11 ENCOUNTER — Ambulatory Visit (INDEPENDENT_AMBULATORY_CARE_PROVIDER_SITE_OTHER): Payer: Medicare Other

## 2020-02-11 DIAGNOSIS — I5022 Chronic systolic (congestive) heart failure: Secondary | ICD-10-CM | POA: Diagnosis not present

## 2020-02-11 DIAGNOSIS — Z95810 Presence of automatic (implantable) cardiac defibrillator: Secondary | ICD-10-CM

## 2020-02-11 NOTE — Progress Notes (Signed)
EPIC Encounter for ICM Monitoring  Patient Name: Vanessa Carlson is a 59 y.o. female Date: 02/11/2020 Primary Care Physican: Virginia Crews, MD Primary Cardiologist: Carlis Abbott, NP HF Electrophysiologist: Caryl Comes 01/08/2020 Office [id number]:707 lbs   Transmission reviewed.   Optivol thoracic impedancenormal.  Prescribed:Furosemide40 mg2tablets (80 mg total) twice a day.   Labs: 12/19/2019 Creatinine 0.97, BUN 14, Potassium 4.8, Sodium 145, GFR 64-74 09/17/2019 Creatinine 0.89, BUN 13, Potassium 3.6, Sodium 140, GFR >60 A complete set of results can be found in Results Review.  Recommendations:None  Follow-up plan: ICM clinic phone appointment on 03/17/2020. 91 day device clinic remote transmission7/27/2021.   Copy of ICM check sent to [facility].  3 month ICM trend: 02/11/2020    1 Year ICM trend:       [name], RN 02/11/2020 3:40 PM

## 2020-02-12 ENCOUNTER — Telehealth: Payer: Self-pay | Admitting: Family Medicine

## 2020-02-12 ENCOUNTER — Ambulatory Visit (INDEPENDENT_AMBULATORY_CARE_PROVIDER_SITE_OTHER): Payer: Medicare Other

## 2020-02-12 ENCOUNTER — Other Ambulatory Visit: Payer: Self-pay

## 2020-02-12 DIAGNOSIS — I480 Paroxysmal atrial fibrillation: Secondary | ICD-10-CM | POA: Diagnosis not present

## 2020-02-12 DIAGNOSIS — Z952 Presence of prosthetic heart valve: Secondary | ICD-10-CM

## 2020-02-12 LAB — POCT INR
INR: 8 — AB (ref 2.0–3.0)
PT: 96

## 2020-02-12 MED ORDER — PHYTONADIONE 5 MG PO TABS: 10.0000 mg | ORAL_TABLET | Freq: Once | ORAL | 0 refills | Status: DC

## 2020-02-12 MED ORDER — PHYTONADIONE 5 MG PO TABS: 10.0000 mg | ORAL_TABLET | Freq: Once | ORAL | 0 refills | Status: AC

## 2020-02-12 NOTE — Telephone Encounter (Signed)
I spoke with the patient.  She is going to eat a few servings of greens today and come by tomorrow to have her PT re-checked.  She has been cautioned about the risk of bleeding.   She states her symptoms of belly pain and black stools is cleared now.  She said she had taken some over the counter detox cleansing pills and thinks that is what caused her pain and change in stools.

## 2020-02-12 NOTE — Telephone Encounter (Signed)
Pam at [facility] states the Rx phytonadione (VITAMIN K) 5 MG tablet  For the 2 tabs is $136.95. Pt is not going to get, and they want to make sure the dr is aware of this decision.

## 2020-02-12 NOTE — Patient Instructions (Signed)
Description   Current Dose: 10mg  daily except 7.5 mg W and Sun Changes made: Skip for 3 (three) days and recheck this Friday June 18,2021 and take the Vitamin K single dose. Prescription sent in to [facility].

## 2020-02-13 ENCOUNTER — Other Ambulatory Visit: Payer: Self-pay

## 2020-02-13 ENCOUNTER — Ambulatory Visit (INDEPENDENT_AMBULATORY_CARE_PROVIDER_SITE_OTHER): Payer: Medicare Other | Admitting: Adult Health

## 2020-02-13 ENCOUNTER — Ambulatory Visit: Payer: Medicare Other | Admitting: Adult Health

## 2020-02-13 ENCOUNTER — Ambulatory Visit: Payer: Medicare Other

## 2020-02-13 DIAGNOSIS — Z952 Presence of prosthetic heart valve: Secondary | ICD-10-CM | POA: Diagnosis not present

## 2020-02-13 LAB — POCT INR
INR: 6.9 — AB (ref 2.0–3.0)
PT: 82.4

## 2020-02-15 ENCOUNTER — Ambulatory Visit (INDEPENDENT_AMBULATORY_CARE_PROVIDER_SITE_OTHER): Payer: Medicare Other

## 2020-02-15 ENCOUNTER — Other Ambulatory Visit: Payer: Self-pay

## 2020-02-15 DIAGNOSIS — I480 Paroxysmal atrial fibrillation: Secondary | ICD-10-CM

## 2020-02-15 DIAGNOSIS — Z952 Presence of prosthetic heart valve: Secondary | ICD-10-CM

## 2020-02-15 LAB — POCT INR
INR: 1.7 — AB (ref 2.0–3.0)
PT: 20.4

## 2020-02-15 NOTE — Patient Instructions (Signed)
Description    7.5 mg daily fu 02/19/2020

## 2020-02-18 ENCOUNTER — Ambulatory Visit: Payer: Medicare Other

## 2020-02-19 ENCOUNTER — Ambulatory Visit (INDEPENDENT_AMBULATORY_CARE_PROVIDER_SITE_OTHER): Payer: Medicare Other

## 2020-02-19 ENCOUNTER — Other Ambulatory Visit: Payer: Self-pay

## 2020-02-19 DIAGNOSIS — Z952 Presence of prosthetic heart valve: Secondary | ICD-10-CM | POA: Diagnosis not present

## 2020-02-19 DIAGNOSIS — I480 Paroxysmal atrial fibrillation: Secondary | ICD-10-CM

## 2020-02-19 LAB — POCT INR
INR: 1.9 — AB (ref 2.0–3.0)
Prothrombin Time: 23.4

## 2020-02-19 NOTE — Patient Instructions (Signed)
Description    7.5 mg daily except Tuesday take 10mg  return in 2 weeks

## 2020-03-04 ENCOUNTER — Other Ambulatory Visit: Payer: Self-pay

## 2020-03-04 ENCOUNTER — Ambulatory Visit (INDEPENDENT_AMBULATORY_CARE_PROVIDER_SITE_OTHER): Payer: Medicare Other

## 2020-03-04 DIAGNOSIS — I480 Paroxysmal atrial fibrillation: Secondary | ICD-10-CM

## 2020-03-04 DIAGNOSIS — Z952 Presence of prosthetic heart valve: Secondary | ICD-10-CM

## 2020-03-04 LAB — POCT INR
INR: 3.3 — AB (ref 2.0–3.0)
PT: 39.4

## 2020-03-04 NOTE — Patient Instructions (Signed)
Description    7.5 mg daily except Tuesday take 10mg  return in 2 weeks

## 2020-03-14 ENCOUNTER — Other Ambulatory Visit: Payer: Self-pay

## 2020-03-14 ENCOUNTER — Encounter: Payer: Self-pay | Admitting: Family Medicine

## 2020-03-14 DIAGNOSIS — I480 Paroxysmal atrial fibrillation: Secondary | ICD-10-CM

## 2020-03-14 DIAGNOSIS — Z952 Presence of prosthetic heart valve: Secondary | ICD-10-CM

## 2020-03-14 MED ORDER — WARFARIN SODIUM 10 MG PO TABS: 10.0000 mg | ORAL_TABLET | ORAL | 3 refills | Status: DC

## 2020-03-17 ENCOUNTER — Ambulatory Visit (INDEPENDENT_AMBULATORY_CARE_PROVIDER_SITE_OTHER): Payer: Medicare Other

## 2020-03-17 DIAGNOSIS — I5022 Chronic systolic (congestive) heart failure: Secondary | ICD-10-CM

## 2020-03-17 DIAGNOSIS — Z95810 Presence of automatic (implantable) cardiac defibrillator: Secondary | ICD-10-CM

## 2020-03-18 ENCOUNTER — Ambulatory Visit (INDEPENDENT_AMBULATORY_CARE_PROVIDER_SITE_OTHER): Payer: Medicare Other

## 2020-03-18 ENCOUNTER — Telehealth: Payer: Self-pay

## 2020-03-18 ENCOUNTER — Other Ambulatory Visit: Payer: Self-pay

## 2020-03-18 ENCOUNTER — Ambulatory Visit: Payer: Medicare Other | Attending: Family Medicine

## 2020-03-18 DIAGNOSIS — I5022 Chronic systolic (congestive) heart failure: Secondary | ICD-10-CM | POA: Diagnosis not present

## 2020-03-18 DIAGNOSIS — Z952 Presence of prosthetic heart valve: Secondary | ICD-10-CM

## 2020-03-18 DIAGNOSIS — M25661 Stiffness of right knee, not elsewhere classified: Secondary | ICD-10-CM | POA: Insufficient documentation

## 2020-03-18 DIAGNOSIS — G8929 Other chronic pain: Secondary | ICD-10-CM | POA: Diagnosis not present

## 2020-03-18 DIAGNOSIS — M25561 Pain in right knee: Secondary | ICD-10-CM | POA: Diagnosis not present

## 2020-03-18 DIAGNOSIS — R252 Cramp and spasm: Secondary | ICD-10-CM | POA: Diagnosis not present

## 2020-03-18 DIAGNOSIS — I480 Paroxysmal atrial fibrillation: Secondary | ICD-10-CM | POA: Diagnosis not present

## 2020-03-18 DIAGNOSIS — Z95810 Presence of automatic (implantable) cardiac defibrillator: Secondary | ICD-10-CM

## 2020-03-18 LAB — POCT INR
INR: 3.6 — AB (ref 2.0–3.0)
PT: 43.6

## 2020-03-18 NOTE — Progress Notes (Signed)
EPIC Encounter for ICM Monitoring  Patient Name: Vanessa Carlson is a 59 y.o. female Date: 03/18/2020 Primary Care Physican: Virginia Crews, MD Primary Cardiologist: Carlis Abbott, NP HF Electrophysiologist: Caryl Comes 01/08/2020 OfficeWeight:227 lbs   Attempted call to patient and unable to reach.  Left detailed message per DPR regarding transmission. Transmission reviewed.   Optivol thoracic impedancenormal.  Prescribed:Furosemide40 mg2tablets (80 mg total) twice a day.   Labs: 12/19/2019 Creatinine 0.97, BUN 14, Potassium 4.8, Sodium 145, GFR 64-74 09/17/2019 Creatinine 0.89, BUN 13, Potassium 3.6, Sodium 140, GFR >60 A complete set of results can be found in Results Review.  Recommendations:Left voice mail with ICM number and encouraged to call if experiencing any fluid symptoms.  Follow-up plan: ICM clinic phone appointment on 04/21/2020. 91 day device clinic remote transmission7/27/2021.     EP/Cardiology Office Visits: 01/07/2021 with Dr. Caryl Comes.    Copy of ICM check sent to Dr. Caryl Comes.   3 month ICM trend: 03/17/2020    1 Year ICM trend:       [name], RN 03/18/2020 4:26 PM

## 2020-03-18 NOTE — Patient Instructions (Signed)
Hold for three days. Then continue current dose 7.5mg  daily execpt 10mg  on Tuesdays.  Recheck in three weeks.

## 2020-03-18 NOTE — Telephone Encounter (Signed)
Remote ICM transmission received.  Attempted call to patient regarding ICM remote transmission and left detailed message per DPR.  Advised to return call for any fluid symptoms or questions. Next ICM remote transmission scheduled 04/21/2020.

## 2020-03-19 ENCOUNTER — Ambulatory Visit
Admission: RE | Admit: 2020-03-19 | Discharge: 2020-03-19 | Disposition: A | Discharge status: Home / Self Care | Payer: Medicare Other | Source: Ambulatory Visit | Attending: Oncology | Admitting: Oncology

## 2020-03-19 ENCOUNTER — Other Ambulatory Visit: Payer: Self-pay

## 2020-03-19 ENCOUNTER — Inpatient Hospital Stay: Payer: Medicare Other | Attending: Oncology

## 2020-03-19 DIAGNOSIS — C3411 Malignant neoplasm of upper lobe, right bronchus or lung: Secondary | ICD-10-CM | POA: Insufficient documentation

## 2020-03-19 DIAGNOSIS — Z7982 Long term (current) use of aspirin: Secondary | ICD-10-CM | POA: Diagnosis not present

## 2020-03-19 DIAGNOSIS — Z8249 Family history of ischemic heart disease and other diseases of the circulatory system: Secondary | ICD-10-CM | POA: Insufficient documentation

## 2020-03-19 DIAGNOSIS — Z833 Family history of diabetes mellitus: Secondary | ICD-10-CM | POA: Diagnosis not present

## 2020-03-19 DIAGNOSIS — Z85118 Personal history of other malignant neoplasm of bronchus and lung: Secondary | ICD-10-CM

## 2020-03-19 DIAGNOSIS — I110 Hypertensive heart disease with heart failure: Secondary | ICD-10-CM | POA: Diagnosis not present

## 2020-03-19 DIAGNOSIS — I5042 Chronic combined systolic (congestive) and diastolic (congestive) heart failure: Secondary | ICD-10-CM | POA: Insufficient documentation

## 2020-03-19 DIAGNOSIS — Z800 Family history of malignant neoplasm of digestive organs: Secondary | ICD-10-CM | POA: Diagnosis not present

## 2020-03-19 DIAGNOSIS — Z803 Family history of malignant neoplasm of breast: Secondary | ICD-10-CM | POA: Insufficient documentation

## 2020-03-19 DIAGNOSIS — I252 Old myocardial infarction: Secondary | ICD-10-CM | POA: Insufficient documentation

## 2020-03-19 DIAGNOSIS — Z7901 Long term (current) use of anticoagulants: Secondary | ICD-10-CM | POA: Diagnosis not present

## 2020-03-19 DIAGNOSIS — Z90710 Acquired absence of both cervix and uterus: Secondary | ICD-10-CM | POA: Insufficient documentation

## 2020-03-19 DIAGNOSIS — Z08 Encounter for follow-up examination after completed treatment for malignant neoplasm: Secondary | ICD-10-CM | POA: Diagnosis not present

## 2020-03-19 DIAGNOSIS — Z87891 Personal history of nicotine dependence: Secondary | ICD-10-CM | POA: Diagnosis not present

## 2020-03-19 DIAGNOSIS — Z79899 Other long term (current) drug therapy: Secondary | ICD-10-CM | POA: Insufficient documentation

## 2020-03-19 LAB — CBC WITH DIFFERENTIAL/PLATELET
Abs Immature Granulocytes: 0.02 10*3/uL (ref 0.00–0.07)
Basophils Absolute: 0 10*3/uL (ref 0.0–0.1)
Basophils Relative: 1 %
Eosinophils Absolute: 0.1 10*3/uL (ref 0.0–0.5)
Eosinophils Relative: 2 %
HCT: 38.3 % (ref 36.0–46.0)
Hemoglobin: 12.5 g/dL (ref 12.0–15.0)
Immature Granulocytes: 0 %
Lymphocytes Relative: 25 %
Lymphs Abs: 1.4 10*3/uL (ref 0.7–4.0)
MCH: 28.6 pg (ref 26.0–34.0)
MCHC: 32.6 g/dL (ref 30.0–36.0)
MCV: 87.6 fL (ref 80.0–100.0)
Monocytes Absolute: 0.6 10*3/uL (ref 0.1–1.0)
Monocytes Relative: 11 %
Neutro Abs: 3.4 10*3/uL (ref 1.7–7.7)
Neutrophils Relative %: 61 %
Platelets: 168 10*3/uL (ref 150–400)
RBC: 4.37 MIL/uL (ref 3.87–5.11)
RDW: 15.4 % (ref 11.5–15.5)
WBC: 5.6 10*3/uL (ref 4.0–10.5)
nRBC: 0 % (ref 0.0–0.2)

## 2020-03-19 LAB — COMPREHENSIVE METABOLIC PANEL
ALT: 24 U/L (ref 0–44)
AST: 27 U/L (ref 15–41)
Albumin: 4.1 g/dL (ref 3.5–5.0)
Alkaline Phosphatase: 80 U/L (ref 38–126)
Anion gap: 8 (ref 5–15)
BUN: 17 mg/dL (ref 6–20)
CO2: 29 mmol/L (ref 22–32)
Calcium: 9.1 mg/dL (ref 8.9–10.3)
Chloride: 104 mmol/L (ref 98–111)
Creatinine, Ser: 0.94 mg/dL (ref 0.44–1.00)
GFR calc Af Amer: >60 mL/min (ref >60)
GFR calc non Af Amer: >60 mL/min (ref >60)
Glucose, Bld: 137 mg/dL — ABNORMAL HIGH (ref 70–99)
Potassium: 4.4 mmol/L (ref 3.5–5.1)
Sodium: 141 mmol/L (ref 135–145)
Total Bilirubin: 1.2 mg/dL (ref 0.3–1.2)
Total Protein: 8 g/dL (ref 6.5–8.1)

## 2020-03-19 NOTE — Therapy (Addendum)
[facility] [address], [place], [address] Phone: [phone]   Fax:  [phone]  Physical Therapy Evaluation  Patient Details  Name: Vanessa Carlson MRN: [id number] Date of Birth: [birth year] Referring Provider (PT): Brita Romp MD   Encounter Date: 03/18/2020   PT End of Session - 03/18/20 1737    Visit Number 1    Number of Visits 13    Date for PT Re-Evaluation 04/29/20    PT Start Time 6979    PT Stop Time 1745    PT Time Calculation (min) 60 min    Activity Tolerance Patient tolerated treatment well    Behavior During Therapy [facility] for tasks assessed/performed           Past Medical History:  Diagnosis Date  . AICD (automatic cardioverter/defibrillator) present   . Anterior myocardial infarction [facility])    a. 10/2014 - occluded LAD, complicated by cardiogenic shock-->Med Rx as interventional team was unable to open LAD.  [name] Cancer of upper lobe of right lung ([place]) 09/2017   radiation therapy right side  . Cervical cancer ([place])    in remission for ~20 yrs, s/p hysterectomy  . Chronic combined systolic and diastolic CHF (congestive heart failure) ([place])    a. 10/2015 Echo: EF 20-25%, sev diast dysfxn, mildly reduced RV fxn. Nl MV prosthesis; b. 12/2017 Echo: EF 20-25%, diff HK. Sev ant/antsept HK, apical AK. Mild MS (mean graad 32mmHg). Mod TR. PASP 62mmHg.  [name] Coronary artery disease    a. 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  . Essential hypertension   . Ischemic cardiomyopathy    a. 03/2015 s/p MDT single lead AICD;  b. 10/2015 Echo: EF 20-25%;  c. 12/2017 Echo: EF 20-25%, diff HK.  . Mass of upper lobe of right lung    a. noted on CXR & CT 08/2017 w/ abnl PET CT.  [name] Mitral valve disease    a. 2006 s/p MVR w/ 37mm SJM bileaflet mechanical valve-->chronic coumadin/ASA; b. 12/2017 Echo: Mild MS. Mean gradient 9mmHg.   [name] Personal history of radiation therapy    f/u lung ca    Past Surgical History:  Procedure Laterality Date  . APPENDECTOMY  1998  . BREAST EXCISIONAL BIOPSY Left 80s   benign  . CARDIAC CATHETERIZATION    . CARDIAC DEFIBRILLATOR PLACEMENT    . COLONOSCOPY WITH PROPOFOL N/A 01/11/2020   Procedure: COLONOSCOPY WITH PROPOFOL;  Surgeon: Jonathon Bellows, MD;  Location: [facility];  Service: Gastroenterology;  Laterality: N/A;  . RADICAL HYSTERECTOMY  1998  . SHOULDER SURGERY Bilateral    rotator cuff tears  . VALVE REPLACEMENT     Mitral valve; 27 mm St. Jude bileaflet valve    There were no vitals filed for this visit.    Subjective Assessment - 03/18/20 1651    Subjective Increased R knee pain beginning 6 months ago with falling down the stairs four months ago. Patient states she fell and iced and elevated the knee after the fall. Patient reports increased swelling after about 20-48min after the fall, no audible sounds noted afterwards. Patient states increased pain with performing prolonged walking (~1 hour), and ascending/desending the stairs. Patient reports decreased pain with rubbing a pain free ointment. Patient states she has pain around 3/5 days. Decreased pain with elevation, rest, ice and massage from her husband. Patient reports increased pain along the patella.  Pertinent History Hx of CA, insidious onset of L sided neck pain 3 weeks prior, B rotator cuff repair  surgery (unknown level of severity), MI, R knee pain 6 months ago    Limitations Lifting    Diagnostic tests none    Patient Stated Goals To avoid knee replacement; Walk easier.    Currently in Pain? Yes    Pain Score 8     Pain Location Knee    Pain Orientation Right    Pain Descriptors / Indicators Aching    Pain Type Chronic pain    Pain Onset More than a month ago    Pain Frequency Intermittent              [facility] PT Assessment - 03/18/20 1702      Assessment   Medical Diagnosis R knee pain     Referring Provider (PT) Bacigalupo MD    Onset Date/Surgical Date 10/01/19    Hand Dominance Right    Next MD Visit unknown    Prior Therapy yes - neck      Observation/Other Assessments   Observations Structural knee valgus in standing and in supine      Sensation   Light Touch Appears Intact      Functional Tests   Functional tests Squat      Squat   Comments Increased pain past 70 degrees       AROM   AROM Assessment Site Hip;Knee    Right/Left Hip Left;Right    Right/Left Knee Left;Right    Right Knee Extension -2    Right Knee Flexion 122    Left Knee Extension -2    Left Knee Flexion 128      Strength   Strength Assessment Site Hip;Knee    Right/Left Hip Left;Right    Right Hip Flexion 4-/5    Right Hip Extension 4-/5    Right Hip External Rotation  4+/5    Right Hip Internal Rotation 4+/5    Right Hip ABduction 4-/5    Left Hip Flexion 4-/5    Left Hip Extension 4-/5    Left Hip External Rotation 4+/5    Left Hip Internal Rotation 4+/5    Left Hip ABduction 4/5    Right/Left Knee Right;Left    Right Knee Flexion 4/5    Right Knee Extension 5/5    Left Knee Flexion 4/5    Left Knee Extension 5/5      Palpation   Patella mobility WNL    Palpation comment TTP: MCL, vastus medialis, hip adductors      Special Tests    Special Tests Knee Special Tests    Knee Special tests  other;other2      other    Findings Positive    Side  Right    Comments MCL Provacation - pain      other   findings Negative    Comments B LCL, Mcconnells, Eges      Ambulation/Gait   Gait Comments Increased functional valgus with performing step ups/step downs - improved with cueing           Therapeutic Exercise  STS x10 LAQ x10 Step-Up x10    Objective measurements completed on examination: See above findings.        PT Education - 03/18/20 1736    Education Details Form/technique with exercise; POC; Added knee flexion/extension LAQ, sit to stands, step ups     Person(s) Educated Patient    Methods Explanation;Demonstration;Handout  Comprehension Verbalized understanding;Returned demonstration;Verbal cues required;Tactile cues required            PT Short Term Goals - 03/19/20 1035      PT SHORT TERM GOAL #1   Title Patient will be independent with HEP to continue benefits of therapy until after discharge.    Baseline Dependent for form/technique    Time 6    Period Weeks    Status New    Target Date 04/08/20             PT Long Term Goals - 03/19/20 1036      PT LONG TERM GOAL #1   Title Patient will have a worst pain of a 1/10 to indicate significant improvement in pain and spasms most notably while using stairs.    Baseline 8/10    Time 6    Period Weeks    Status New    Target Date 04/29/20      PT LONG TERM GOAL #2   Title Patient will improve her FOTO score to 65 to indicate improvement in functional mobility with less pain.    Baseline 03/18/20: 57    Time 6    Period Weeks    Status New    Target Date 04/29/20      PT LONG TERM GOAL #3   Title Patient will be able to ascend and descend stairs without an increase in pain to allow for comfortable home and community ambulation.    Baseline Pain ascending/descending staits    Time 6    Period Weeks    Status New    Target Date 04/29/20      PT LONG TERM GOAL #4   Title Patient will be able to perform > 1 hour of walking to return to recreational activities for fitness    Baseline 20 min before requiring a sitting rest break    Time 6    Period Weeks    Status New    Target Date 04/29/20                  Plan - 03/19/20 0846    Clinical Impression Statement Patient is a 59 y/o female who presents with a c/c of right knee pain that began 6 months ago which worsened after falling downstairs 4 months ago.  Patient described that her leg was bent underneath and out to the side her when she fell down the steps.  Patients knee pain has been consistent  since the fall down her stairs and limits her walking and climbing stairs due to pain.  Patient had a negative Ege's and McMurray's test, however, had a positive valgus stress test of the knee along with point tenderness along the MCL indicating that MCL may be a contributing factor leading to her knee pain. Patient also had a positive eccentric step-down test along with restricted superior to inferior motion of the patella which can also be a contributing factor to the knee pain she is having. Patient will benefit from skilled therapy to progress towards goals and return to prior level of function.    Personal Factors and Comorbidities Age;Comorbidity 3+    Comorbidities MI, hx of CA, B RCR surgery    Examination-Activity Limitations Sit;Lift;Squat;Stairs;Locomotion Level    Examination-Participation Restrictions Community Activity;Other    Stability/Clinical Decision Making Stable/Uncomplicated    Clinical Decision Making Low    Rehab Potential Good    PT Frequency 2x / week    PT Duration  6 weeks    PT Treatment/Interventions ADLs/Self Care Home Management;Biofeedback;Electrical Stimulation;Moist Heat;Cryotherapy;Gait training;Stair training;Functional mobility training;Therapeutic activities;Therapeutic exercise;Balance training;Neuromuscular re-education;Patient/family education;Manual techniques;Passive range of motion;Dry needling;Taping;Spinal Manipulations;Joint Manipulations    PT Next Visit Plan Review HEP and Begin strengthening exercises    PT Home Exercise Plan STS, LAQ, Step-ups    Consulted and Agree with Plan of Care Patient           Patient will benefit from skilled therapeutic intervention in order to improve the following deficits and impairments:  Pain, Decreased mobility, Postural dysfunction, Decreased endurance, Decreased range of motion, Decreased strength, Decreased activity tolerance, Abnormal gait, Hypomobility, Improper body mechanics  Visit Diagnosis: Chronic pain  of right knee  Stiffness of right knee, not elsewhere classified     Problem List Patient Active Problem List   Diagnosis Date Noted  . 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 Neck muscle spasm 12/14/2019  . Myocardiopathy ([place]) 08/18/2018  . Valvular heart disease 08/18/2018  . Lung cancer ([place]) 05/31/2018  . Hyperlipidemia LDL goal <70 03/22/2018  . Morbid obesity ([place]) 12/22/2017  . Insomnia 12/07/2017  . OA (osteoarthritis) of knee 07/08/2017  . Chronic anticoagulation 07/08/2017  . Paroxysmal atrial fibrillation ([facility])   . Coronary artery disease 02/24/2017  . Ischemic cardiomyopathy 02/24/2017  . Rheumatic heart disease 02/24/2017  . Status post mitral valve replacement 02/24/2017  . Chronic systolic heart failure ([place]) 01/02/2017  . HTN (hypertension) 01/02/2017   4:29 PM, 03/19/20 Margarito Liner, SPT Student Physical Therapist [place]  [phone]  Margarito Liner 03/19/2020, 4:29 PM  [facility] [address], [place], [address] Phone: [phone]   Fax:  [phone]  Name: ZARA WENDT MRN: [id number] Date of Birth: [birth year]

## 2020-03-20 ENCOUNTER — Other Ambulatory Visit: Payer: Self-pay

## 2020-03-20 ENCOUNTER — Ambulatory Visit: Payer: Medicare Other

## 2020-03-20 DIAGNOSIS — M25661 Stiffness of right knee, not elsewhere classified: Secondary | ICD-10-CM | POA: Diagnosis not present

## 2020-03-20 DIAGNOSIS — M25561 Pain in right knee: Secondary | ICD-10-CM | POA: Diagnosis not present

## 2020-03-20 DIAGNOSIS — G8929 Other chronic pain: Secondary | ICD-10-CM | POA: Diagnosis not present

## 2020-03-20 DIAGNOSIS — R252 Cramp and spasm: Secondary | ICD-10-CM | POA: Diagnosis not present

## 2020-03-20 NOTE — Therapy (Signed)
[facility] [address], [place], [address] Phone: [phone]   Fax:  [phone]  Physical Therapy Treatment  Patient Details  Name: Vanessa Carlson MRN: [id number] Date of Birth: Jan 08, 1961 Referring Provider (PT): Brita Romp MD   Encounter Date: 03/20/2020   PT End of Session - 03/20/20 0852    Visit Number 2    Number of Visits 13    Date for PT Re-Evaluation 04/29/20    PT Start Time 0845    PT Stop Time 0930    PT Time Calculation (min) 45 min    Activity Tolerance Patient tolerated treatment well    Behavior During Therapy [facility] for tasks assessed/performed           Past Medical History:  Diagnosis Date  . AICD (automatic cardioverter/defibrillator) present   . Anterior myocardial infarction [facility])    a. 10/2014 - occluded LAD, complicated by cardiogenic shock-->Med Rx as interventional team was unable to open LAD.  [name] Cancer of upper lobe of right lung ([place]) 09/2017   radiation therapy right side  . Cervical cancer ([place])    in remission for ~20 yrs, s/p hysterectomy  . Chronic combined systolic and diastolic CHF (congestive heart failure) ([place])    a. 10/2015 Echo: EF 20-25%, sev diast dysfxn, mildly reduced RV fxn. Nl MV prosthesis; b. 12/2017 Echo: EF 20-25%, diff HK. Sev ant/antsept HK, apical AK. Mild MS (mean graad 72mmHg). Mod TR. PASP 74mmHg.  [name] Coronary artery disease    a. 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  . Essential hypertension   . Ischemic cardiomyopathy    a. 03/2015 s/p MDT single lead AICD;  b. 10/2015 Echo: EF 20-25%;  c. 12/2017 Echo: EF 20-25%, diff HK.  . Mass of upper lobe of right lung    a. noted on CXR & CT 08/2017 w/ abnl PET CT.  [name] Mitral valve disease    a. 2006 s/p MVR w/ 81mm SJM bileaflet mechanical valve-->chronic coumadin/ASA; b. 12/2017 Echo: Mild MS. Mean gradient 21mmHg.   [name] Personal history of radiation therapy    f/u lung ca    Past Surgical History:  Procedure Laterality Date  . APPENDECTOMY  1998  . BREAST EXCISIONAL BIOPSY Left 80s   benign  . CARDIAC CATHETERIZATION    . CARDIAC DEFIBRILLATOR PLACEMENT    . COLONOSCOPY WITH PROPOFOL N/A 01/11/2020   Procedure: COLONOSCOPY WITH PROPOFOL;  Surgeon: Jonathon Bellows, MD;  Location: [facility] ENDOSCOPY;  Service: Gastroenterology;  Laterality: N/A;  . RADICAL HYSTERECTOMY  1998  . SHOULDER SURGERY Bilateral    rotator cuff tears  . VALVE REPLACEMENT     Mitral valve; 27 mm St. Jude bileaflet valve    There were no vitals filed for this visit.   Subjective Assessment - 03/20/20 0849    Subjective Patient reports soreness after last session and relates this to performing HEP. She also reports that she had pain in her knee after working around 1:30.    Pertinent History Hx of CA, insidious onset of L sided neck pain 3 weeks prior, B rotator cuff repair  surgery (unknown level of severity), MI, R knee pain 6 months ago    Limitations Lifting    Diagnostic tests none    Patient Stated Goals To avoid knee replacement; Walk easier.    Currently in Pain? No/denies    Pain Onset More  than a month ago           INTERVENTIONS   Therapeutic Exercise Glute Bridge 2x10  Supine Hip ER 2x10 (with green Tband) SLR 2x10 Bilat. Standing Hip Abduction 2x10 with 2lb AW Bilat. Standing Hip Extension 2x10 with 2lb AW Bilat. Stair Training x3 ascend/descend  Performed exercises to improve hip strength      PT Education - 03/20/20 [phone]    Education Details Form/technique with exercise    Person(s) Educated Patient    Methods Explanation;Demonstration    Comprehension Verbalized understanding;Returned demonstration            PT Short Term Goals - 03/19/20 1035      PT SHORT TERM GOAL #1   Title Patient will be independent with HEP to continue benefits of therapy until after discharge.    Baseline  Dependent for form/technique    Time 6    Period Weeks    Status New    Target Date 04/08/20             PT Long Term Goals - 03/19/20 1036      PT LONG TERM GOAL #1   Title Patient will have a worst pain of a 1/10 to indicate significant improvement in pain and spasms most notably while using stairs.    Baseline 8/10    Time 6    Period Weeks    Status New    Target Date 04/29/20      PT LONG TERM GOAL #2   Title Patient will improve her FOTO score to 65 to indicate improvement in functional mobility with less pain.    Baseline 03/18/20: 57    Time 6    Period Weeks    Status New    Target Date 04/29/20      PT LONG TERM GOAL #3   Title Patient will be able to ascend and descend stairs without an increase in pain to allow for comfortable home and community ambulation.    Baseline Pain ascending/descending staits    Time 6    Period Weeks    Status New    Target Date 04/29/20      PT LONG TERM GOAL #4   Title Patient will be able to perform > 1 hour of walking to return to recreational activities for fitness    Baseline 20 min before requiring a sitting rest break    Time 6    Period Weeks    Status New    Target Date 04/29/20                 Plan - 03/20/20 0853    Clinical Impression Statement Began focusing on hip strengthening during todays session and patient tolerated exercises well.  Patient had mild knee pain when performing exercise, however, she was able to complete all exercises without an increase in symptoms.  Patient demonstrated muscular fatigue at the end of session, indicating a need to address muscular endurance in sessions to follow.  Patient will benefit from skilled therapy to progress towards goals and return to prior level of function.    Personal Factors and Comorbidities Age;Comorbidity 3+    Comorbidities MI, hx of CA, B RCR surgery    Examination-Activity Limitations Sit;Lift;Squat;Stairs;Locomotion Level     Examination-Participation Restrictions Community Activity;Other    Stability/Clinical Decision Making Stable/Uncomplicated    Clinical Decision Making Low    Rehab Potential Good    PT Frequency 2x / week  PT Duration 6 weeks    PT Treatment/Interventions ADLs/Self Care Home Management;Biofeedback;Electrical Stimulation;Moist Heat;Cryotherapy;Gait training;Stair training;Functional mobility training;Therapeutic activities;Therapeutic exercise;Balance training;Neuromuscular re-education;Patient/family education;Manual techniques;Passive range of motion;Dry needling;Taping;Spinal Manipulations;Joint Manipulations    PT Next Visit Plan Address muscular endurance and continue with LE Strengthening    PT Home Exercise Plan STS, LAQ, Step-ups    Consulted and Agree with Plan of Care Patient           Patient will benefit from skilled therapeutic intervention in order to improve the following deficits and impairments:  Pain, Decreased mobility, Postural dysfunction, Decreased endurance, Decreased range of motion, Decreased strength, Decreased activity tolerance, Abnormal gait, Hypomobility, Improper body mechanics  Visit Diagnosis: Chronic pain of right knee  Stiffness of right knee, not elsewhere classified     Problem List Patient Active Problem List   Diagnosis Date Noted  . 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 Neck muscle spasm 12/14/2019  . Myocardiopathy ([place]) 08/18/2018  . Valvular heart disease 08/18/2018  . Lung cancer ([place]) 05/31/2018  . Hyperlipidemia LDL goal <70 03/22/2018  . Morbid obesity ([place]) 12/22/2017  . Insomnia 12/07/2017  . OA (osteoarthritis) of knee 07/08/2017  . Chronic anticoagulation 07/08/2017  . Paroxysmal atrial fibrillation ([facility])   . Coronary artery disease 02/24/2017  . Ischemic cardiomyopathy 02/24/2017  . Rheumatic heart disease 02/24/2017  . Status post mitral valve replacement 02/24/2017  . Chronic systolic heart failure ([place])  01/02/2017  . HTN (hypertension) 01/02/2017   9:45 AM, 03/20/20 Margarito Liner, SPT Student Physical Therapist [place]  [phone]  Margarito Liner 03/20/2020, 9:45 AM Merdis Delay, PT, DPT    [facility] [address], [place], [address] Phone: [phone]   Fax:  [phone]  Name: WILLODENE STALLINGS MRN: [id number] Date of Birth: 07/05/1961

## 2020-03-21 ENCOUNTER — Other Ambulatory Visit: Payer: Self-pay

## 2020-03-21 ENCOUNTER — Encounter: Payer: Self-pay | Admitting: Oncology

## 2020-03-21 ENCOUNTER — Inpatient Hospital Stay (HOSPITAL_BASED_OUTPATIENT_CLINIC_OR_DEPARTMENT_OTHER): Payer: Medicare Other | Admitting: Oncology

## 2020-03-21 DIAGNOSIS — Z85118 Personal history of other malignant neoplasm of bronchus and lung: Secondary | ICD-10-CM

## 2020-03-21 DIAGNOSIS — Z08 Encounter for follow-up examination after completed treatment for malignant neoplasm: Secondary | ICD-10-CM

## 2020-03-21 DIAGNOSIS — C3411 Malignant neoplasm of upper lobe, right bronchus or lung: Secondary | ICD-10-CM

## 2020-03-21 NOTE — Progress Notes (Signed)
I connected with Vanessa Carlson on 03/21/20 at 11:30 AM EDT by video enabled telemedicine visit and verified that I am speaking with the correct person using two identifiers.   I discussed the limitations, risks, security and privacy concerns of performing an evaluation and management service by telemedicine and the availability of in-person appointments. I also discussed with the patient that there may be a patient responsible charge related to this service. The patient expressed understanding and agreed to proceed.  Other persons participating in the visit and their role in the encounter:  none  Patient's location:  home Provider's location:  work  Risk analyst Complaint: Discuss CT scan results  Diagnosis: Stage I lung cancer s/p SBRT  History of present illness:  Patient is a 59 year old female with a past medical history significant for CAD, ischemic cardiomyopathy status post AICD placement, rheumatic heart disease status post mechanical mitral valve replacement on Coumadin.She was found to have a right upper lobe lung mass 2.5 cm that was hypermetabolic on PET CT scan in January 2019. No evidence of nodal involvement. She was not deemed to be a candidate for surgery and underwent SBRT to this lesion. This was biopsy-proven adenocarcinoma with acinar and solid morphologies. She then had a CT scan in January 2020 which showed enlarging satellite right upper lobe lung nodules. This was followed by a PET CT scan which showed right upper lobe lung nodules 2 of them were hypermetabolic with an SUV of 8.7 was also a thyroid nodule approximately 4 mm in size which was not definitely hypermetabolic. Patient was seen by Dr. Donella Stade following this andsh receivedSBRT for 5 fractions encompassing all these 3 nodules. Patient has not had any systemic chemotherapy for her lung cancer so far  Interval history patient currently reports doing well.  Appetite and weight have remained stable.  Denies any  worsening cough or shortness of breath.   Review of Systems  Constitutional: Negative for chills, fever, malaise/fatigue and weight loss.  HENT: Negative for congestion, ear discharge and nosebleeds.   Eyes: Negative for blurred vision.  Respiratory: Negative for cough, hemoptysis, sputum production, shortness of breath and wheezing.   Cardiovascular: Negative for chest pain, palpitations, orthopnea and claudication.  Gastrointestinal: Negative for abdominal pain, blood in stool, constipation, diarrhea, heartburn, melena, nausea and vomiting.  Genitourinary: Negative for dysuria, flank pain, frequency, hematuria and urgency.  Musculoskeletal: Negative for back pain, joint pain and myalgias.  Skin: Negative for rash.  Neurological: Negative for dizziness, tingling, focal weakness, seizures, weakness and headaches.  Endo/Heme/Allergies: Does not bruise/bleed easily.  Psychiatric/Behavioral: Negative for depression and suicidal ideas. The patient does not have insomnia.     No Known Allergies  Past Medical History:  Diagnosis Date  . AICD (automatic cardioverter/defibrillator) present   . Anterior myocardial infarction [facility])    a. 10/2014 - occluded LAD, complicated by cardiogenic shock-->Med Rx as interventional team was unable to open LAD.  [name] Cancer of upper lobe of right lung ([place]) 09/2017   radiation therapy right side  . Cervical cancer ([place])    in remission for ~20 yrs, s/p hysterectomy  . Chronic combined systolic and diastolic CHF (congestive heart failure) ([place])    a. 10/2015 Echo: EF 20-25%, sev diast dysfxn, mildly reduced RV fxn. Nl MV prosthesis; b. 12/2017 Echo: EF 20-25%, diff HK. Sev ant/antsept HK, apical AK. Mild MS (mean graad 79mmHg). Mod TR. PASP 23mmHg.  [name] Coronary artery disease    a. 10/2014 Ant STEMI/Cath: LAD 100p ->Med managed as lesion  could not be crossed-->complicated by CGS and post-MI pericarditis ([facility]).  . Essential hypertension   . Ischemic cardiomyopathy     a. 03/2015 s/p MDT single lead AICD;  b. 10/2015 Echo: EF 20-25%;  c. 12/2017 Echo: EF 20-25%, diff HK.  . Mass of upper lobe of right lung    a. noted on CXR & CT 08/2017 w/ abnl PET CT.  [name] Mitral valve disease    a. 2006 s/p MVR w/ 9mm SJM bileaflet mechanical valve-->chronic coumadin/ASA; b. 12/2017 Echo: Mild MS. Mean gradient 69mmHg.  [name] Personal history of radiation therapy    f/u lung ca    Past Surgical History:  Procedure Laterality Date  . APPENDECTOMY  1998  . BREAST EXCISIONAL BIOPSY Left 80s   benign  . CARDIAC CATHETERIZATION    . CARDIAC DEFIBRILLATOR PLACEMENT    . COLONOSCOPY WITH PROPOFOL N/A 01/11/2020   Procedure: COLONOSCOPY WITH PROPOFOL;  Surgeon: Jonathon Bellows, MD;  Location: [facility] ENDOSCOPY;  Service: Gastroenterology;  Laterality: N/A;  . RADICAL HYSTERECTOMY  1998  . SHOULDER SURGERY Bilateral    rotator cuff tears  . VALVE REPLACEMENT     Mitral valve; 27 mm St. Jude bileaflet valve    Social History   Socioeconomic History  . Marital status: Married    Spouse name: Tosin  . Number of children: 2  . Years of education: 38  . Highest education level: Some college, no degree  Occupational History    Employer: [employer]  Tobacco Use  . Smoking status: Former Smoker    Packs/day: 1.00    Years: 4.00    Pack years: 4.00    Types: Cigarettes    Quit date: 1998    Years since quitting: 23.5  . Smokeless tobacco: Never Used  Vaping Use  . Vaping Use: Never used  Substance and Sexual Activity  . Alcohol use: No  . Drug use: No  . Sexual activity: Yes    Partners: Male    Birth control/protection: Surgical  Other Topics Concern  . Not on file  Social History Narrative  . Not on file   Social Determinants of Health   Financial Resource Strain: Low Risk   . Difficulty of Paying Living Expenses: Not hard at all  Food Insecurity: No Food Insecurity  . Worried About Charity fundraiser in the Last Year: Never true  . Ran Out of Food in the  Last Year: Never true  Transportation Needs: No Transportation Needs  . Lack of Transportation (Medical): No  . Lack of Transportation (Non-Medical): No  Physical Activity: Inactive  . Days of Exercise per Week: 0 days  . Minutes of Exercise per Session: 0 min  Stress: No Stress Concern Present  . Feeling of Stress : Not at all  Social Connections: Moderately Integrated  . Frequency of Communication with Friends and Family: More than three times a week  . Frequency of Social Gatherings with Friends and Family: Twice a week  . Attends Religious Services: More than 4 times per year  . Active Member of Clubs or Organizations: No  . Attends Archivist Meetings: Never  . Marital Status: Married  Human resources officer Violence: Not At Risk  . Fear of Current or Ex-Partner: No  . Emotionally Abused: No  . Physically Abused: No  . Sexually Abused: No    Family History  Problem Relation Age of Onset  . Heart attack Mother   . Hypertension Mother   . Diabetes Mother   .  Emphysema Father        smoker  . Diabetes Maternal Grandmother   . Kidney disease Maternal Grandmother   . Breast cancer Maternal Aunt   . Breast cancer Maternal Aunt   . Colon cancer Maternal Uncle      Current Outpatient Medications:  .  aspirin 81 MG chewable tablet, Chew 81 mg by mouth daily., Disp: , Rfl:  .  atorvastatin (LIPITOR) 80 MG tablet, Take 1 tablet by mouth once daily, Disp: 90 tablet, Rfl: 1 .  cyclobenzaprine (FLEXERIL) 5 MG tablet, Take 1 tablet (5 mg total) by mouth 3 (three) times daily as needed for muscle spasms., Disp: 30 tablet, Rfl: 1 .  diclofenac Sodium (VOLTAREN) 1 % GEL, APPLY 2 GRAMS TOPICALLY 4 TIMES DAILY, Disp: 100 g, Rfl: 0 .  fluticasone (FLONASE) 50 MCG/ACT nasal spray, Place 2 sprays into both nostrils as needed for allergies or rhinitis., Disp: , Rfl:  .  furosemide (LASIX) 40 MG tablet, Take 2 tablets (80 mg total) by mouth 2 (two) times daily., Disp: 360 tablet, Rfl:  2 .  gabapentin (NEURONTIN) 300 MG capsule, Take 1 capsule (300 mg total) by mouth 2 (two) times daily., Disp: 180 capsule, Rfl: 3 .  Lidocaine 5 % CREA, Apply 1 application topically every 6 (six) hours as needed., Disp: 113 g, Rfl: 3 .  metoprolol succinate (TOPROL-XL) 50 MG 24 hr tablet, Take 1/2 tablet (25 mg) by mouth two times a day. Take with or immediately following a meal., Disp: 90 tablet, Rfl: 1 .  nitroGLYCERIN (NITROSTAT) 0.3 MG SL tablet, Place 1 tablet (0.3 mg total) under the tongue every 5 (five) minutes as needed for chest pain., Disp: 30 tablet, Rfl: 3 .  sacubitril-valsartan (ENTRESTO) 24-26 MG, Take 1 tablet by mouth 2 (two) times daily., Disp: 180 tablet, Rfl: 3 .  spironolactone (ALDACTONE) 25 MG tablet, Take 1 tablet by mouth once daily, Disp: 90 tablet, Rfl: 1 .  traMADol (ULTRAM) 50 MG tablet, Take 1 tablet (50 mg total) by mouth every 6 (six) hours as needed., Disp: 30 tablet, Rfl: 5 .  traZODone (DESYREL) 50 MG tablet, Take 0.5-1 tablets (25-50 mg total) by mouth at bedtime as needed for sleep., Disp: 30 tablet, Rfl: 3 .  warfarin (COUMADIN) 10 MG tablet, Take 1 tablet (10 mg total) by mouth as directed., Disp: 30 tablet, Rfl: 3 .  warfarin (COUMADIN) 7.5 MG tablet, Take 1 tablet (7.5 mg total) by mouth daily., Disp: 90 tablet, Rfl: 3 .  enoxaparin (LOVENOX) 100 MG/ML injection, Inject 1 mL (100 mg total) into the skin every 12 (twelve) hours for 5 days., Disp: 10 mL, Rfl: 0  CT Chest Wo Contrast  Result Date: 03/19/2020 CLINICAL DATA:  Restaging right upper lobe lung cancer. Status post SBRT. EXAM: CT CHEST WITHOUT CONTRAST TECHNIQUE: Multidetector CT imaging of the chest was performed following the standard protocol without IV contrast. COMPARISON:  09/17/19 FINDINGS: Cardiovascular: Normal heart size. No pericardial effusion identified. Aortic atherosclerosis. Previous median sternotomy and mitral valve repair. Three vessel coronary artery calcifications.  Mediastinum/Nodes: No enlarged mediastinal or axillary lymph nodes. Thyroid gland, trachea, and esophagus demonstrate no significant findings. Lungs/Pleura: No pleural effusion identified. Treated tumor within the right upper lobe is again identified with surrounding bandlike area of consolidation with associated fibrosis and volume loss from the right upper lobe. The appearance is unchanged from the previous exam. No new or enlarging lung nodules identified. Upper Abdomen: No acute abnormality. Musculoskeletal: Anterior right lateral rib  fractures and signs of cortical erosion involving the right second, third ribs has progressed from the previous exam and is favored to represent changes due to external beam radiation. No acute or suspicious osseous bone lesions identified. IMPRESSION: 1. Stable appearance of treated tumor within the right upper lobe with surrounding bandlike area of consolidation with associated fibrosis and volume loss. 2. Progressive changes of cortical erosion with pathologic fractures involving the right second and third ribs favored to represent changes due to external beam radiation. 3. Aortic atherosclerosis. Three vessel coronary artery calcifications noted. Aortic Atherosclerosis (ICD10-I70.0). Electronically Signed   By: Kerby Moors M.D.   On: 03/19/2020 13:40    No images are attached to the encounter.   CMP Latest Ref Rng & Units 03/19/2020  Glucose 70 - 99 mg/dL 137(H)  BUN 6 - 20 mg/dL 17  Creatinine 0.44 - 1.00 mg/dL 0.94  Sodium 135 - 145 mmol/L 141  Potassium 3.5 - 5.1 mmol/L 4.4  Chloride 98 - 111 mmol/L 104  CO2 22 - 32 mmol/L 29  Calcium 8.9 - 10.3 mg/dL 9.1  Total Protein 6.5 - 8.1 g/dL 8.0  Total Bilirubin 0.3 - 1.2 mg/dL 1.2  Alkaline Phos 38 - 126 U/L 80  AST 15 - 41 U/L 27  ALT 0 - 44 U/L 24   CBC Latest Ref Rng & Units 03/19/2020  WBC 4.0 - 10.5 K/uL 5.6  Hemoglobin 12.0 - 15.0 g/dL 12.5  Hematocrit 36 - 46 % 38.3  Platelets 150 - 400 K/uL 168      Observation/objective: Appears in no acute distress over video visit today.  Breathing is nonlabored  Assessment and plan: Patient is a 59 year old female with history of stage I adenocarcinoma of the left upper lobe of the lung s/p SBRT.  She is here for routine follow-up visit to discuss CT scan results  CT chest with contrast on 03/19/2020 shows stable appearance of the treated right upper lobe lesion with changes of radiation fibrosis.  No concerning findings of recurrence or progression.  She does have evidence of cortical erosion and fractures involving second and third ribs likely secondary to radiation.  She remains asymptomatic from this.  Continue to monitor I will see her back in 6 months with repeat CT chest without contrast  Follow-up instructions: As above  I discussed the assessment and treatment plan with the patient. The patient was provided an opportunity to ask questions and all were answered. The patient agreed with the plan and demonstrated an understanding of the instructions.   The patient was advised to call back or seek an in-person evaluation if the symptoms worsen or if the condition fails to improve as anticipated.    Visit Diagnosis: 1. Encounter for follow-up surveillance of lung cancer   2. Malignant neoplasm of upper lobe of right lung ([place])     Dr. Randa Evens, MD, MPH [facility] at [facility] Tel- [phone] 03/21/2020 3:54 PM

## 2020-03-25 ENCOUNTER — Other Ambulatory Visit: Payer: Self-pay

## 2020-03-25 ENCOUNTER — Ambulatory Visit: Payer: Medicare Other

## 2020-03-25 DIAGNOSIS — G8929 Other chronic pain: Secondary | ICD-10-CM | POA: Diagnosis not present

## 2020-03-25 DIAGNOSIS — M25661 Stiffness of right knee, not elsewhere classified: Secondary | ICD-10-CM

## 2020-03-25 DIAGNOSIS — R252 Cramp and spasm: Secondary | ICD-10-CM | POA: Diagnosis not present

## 2020-03-25 DIAGNOSIS — M25561 Pain in right knee: Secondary | ICD-10-CM | POA: Diagnosis not present

## 2020-03-25 NOTE — Therapy (Signed)
[facility] [address], [place], [address] Phone: [phone]   Fax:  [phone]  Physical Therapy Treatment  Patient Details  Name: Vanessa Carlson MRN: [id number] Date of Birth: May 27, 1961 Referring Provider (PT): Brita Romp MD   Encounter Date: 03/25/2020   PT End of Session - 03/25/20 0936    Visit Number 3    Number of Visits 13    Date for PT Re-Evaluation 04/29/20    PT Start Time 0930    PT Stop Time 1015    PT Time Calculation (min) 45 min    Activity Tolerance Patient tolerated treatment well    Behavior During Therapy [facility] for tasks assessed/performed           Past Medical History:  Diagnosis Date  . AICD (automatic cardioverter/defibrillator) present   . Anterior myocardial infarction [facility])    a. 10/2014 - occluded LAD, complicated by cardiogenic shock-->Med Rx as interventional team was unable to open LAD.  [name] Cancer of upper lobe of right lung ([place]) 09/2017   radiation therapy right side  . Cervical cancer ([place])    in remission for ~20 yrs, s/p hysterectomy  . Chronic combined systolic and diastolic CHF (congestive heart failure) ([place])    a. 10/2015 Echo: EF 20-25%, sev diast dysfxn, mildly reduced RV fxn. Nl MV prosthesis; b. 12/2017 Echo: EF 20-25%, diff HK. Sev ant/antsept HK, apical AK. Mild MS (mean graad 61mmHg). Mod TR. PASP 69mmHg.  [name] Coronary artery disease    a. 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  . Essential hypertension   . Ischemic cardiomyopathy    a. 03/2015 s/p MDT single lead AICD;  b. 10/2015 Echo: EF 20-25%;  c. 12/2017 Echo: EF 20-25%, diff HK.  . Mass of upper lobe of right lung    a. noted on CXR & CT 08/2017 w/ abnl PET CT.  [name] Mitral valve disease    a. 2006 s/p MVR w/ 83mm SJM bileaflet mechanical valve-->chronic coumadin/ASA; b. 12/2017 Echo: Mild MS. Mean gradient 11mmHg.   [name] Personal history of radiation therapy    f/u lung ca    Past Surgical History:  Procedure Laterality Date  . APPENDECTOMY  1998  . BREAST EXCISIONAL BIOPSY Left 80s   benign  . CARDIAC CATHETERIZATION    . CARDIAC DEFIBRILLATOR PLACEMENT    . COLONOSCOPY WITH PROPOFOL N/A 01/11/2020   Procedure: COLONOSCOPY WITH PROPOFOL;  Surgeon: Jonathon Bellows, MD;  Location: [facility] ENDOSCOPY;  Service: Gastroenterology;  Laterality: N/A;  . RADICAL HYSTERECTOMY  1998  . SHOULDER SURGERY Bilateral    rotator cuff tears  . VALVE REPLACEMENT     Mitral valve; 27 mm St. Jude bileaflet valve    There were no vitals filed for this visit.   Subjective Assessment - 03/25/20 0935    Subjective Patient reports that her knee is painful this morning and that she walked around 1 mile yesterday along with some exercises.    Pertinent History Hx of CA, insidious onset of L sided neck pain 3 weeks prior, B rotator cuff repair  surgery (unknown level of severity), MI, R knee pain 6 months ago    Limitations Lifting    Diagnostic tests none    Patient Stated Goals To avoid knee replacement; Walk easier.    Currently in Pain? Yes    Pain Score 5     Pain  Location Knee    Pain Onset More than a month ago            INTERVENTIONS    Therapeutic Exercise Glute Bridge 2x10  Supine Hip ER 2x10 (with green Tband) SLR 2x10 with 2lb AW Bilat. Standing Hip Abduction 2x10 with 2lb AW Bilat. Standing Hip Extension 2x10 with 2lb AW Bilat. OMEGA Knee Extension 3x10 with 10lbs Standing Mini Squat with UE Support 2x10 with GTB around knees  Stair Training x5 ascend/descend   Performed exercises to improve hip strength and stability of LE    PT Education - 03/25/20 0936    Education Details form/technique with exercise    Person(s) Educated Patient    Methods Explanation;Demonstration    Comprehension Verbalized understanding;Returned demonstration            PT Short Term Goals - 03/19/20 1035      PT  SHORT TERM GOAL #1   Title Patient will be independent with HEP to continue benefits of therapy until after discharge.    Baseline Dependent for form/technique    Time 6    Period Weeks    Status New    Target Date 04/08/20             PT Long Term Goals - 03/19/20 1036      PT LONG TERM GOAL #1   Title Patient will have a worst pain of a 1/10 to indicate significant improvement in pain and spasms most notably while using stairs.    Baseline 8/10    Time 6    Period Weeks    Status New    Target Date 04/29/20      PT LONG TERM GOAL #2   Title Patient will improve her FOTO score to 65 to indicate improvement in functional mobility with less pain.    Baseline 03/18/20: 57    Time 6    Period Weeks    Status New    Target Date 04/29/20      PT LONG TERM GOAL #3   Title Patient will be able to ascend and descend stairs without an increase in pain to allow for comfortable home and community ambulation.    Baseline Pain ascending/descending staits    Time 6    Period Weeks    Status New    Target Date 04/29/20      PT LONG TERM GOAL #4   Title Patient will be able to perform > 1 hour of walking to return to recreational activities for fitness    Baseline 20 min before requiring a sitting rest break    Time 6    Period Weeks    Status New    Target Date 04/29/20                 Plan - 03/25/20 0936    Clinical Impression Statement Continued focusing on strengthening LE's to help improve stability of patient LE's. Patient had no increase in knee pain throughout session and tolerated addition of knee extensions and mini squats well.  Patient continues to have anterior knee pain when using stairs.  Patient will benefit from skilled therapy to progress towards goals and return to prior level of function.    Personal Factors and Comorbidities Age;Comorbidity 3+    Comorbidities MI, hx of CA, B RCR surgery    Examination-Activity Limitations  Sit;Lift;Squat;Stairs;Locomotion Level    Examination-Participation Restrictions Community Activity;Other    Stability/Clinical Decision Making Stable/Uncomplicated    Clinical  Decision Making Low    Rehab Potential Good    PT Frequency 2x / week    PT Duration 6 weeks    PT Treatment/Interventions ADLs/Self Care Home Management;Biofeedback;Electrical Stimulation;Moist Heat;Cryotherapy;Gait training;Stair training;Functional mobility training;Therapeutic activities;Therapeutic exercise;Balance training;Neuromuscular re-education;Patient/family education;Manual techniques;Passive range of motion;Dry needling;Taping;Spinal Manipulations;Joint Manipulations    PT Next Visit Plan Address muscular endurance and continue with LE Strengthening    PT Home Exercise Plan STS, LAQ, Step-ups    Consulted and Agree with Plan of Care Patient           Patient will benefit from skilled therapeutic intervention in order to improve the following deficits and impairments:  Pain, Decreased mobility, Postural dysfunction, Decreased endurance, Decreased range of motion, Decreased strength, Decreased activity tolerance, Abnormal gait, Hypomobility, Improper body mechanics  Visit Diagnosis: Chronic pain of right knee  Stiffness of right knee, not elsewhere classified  Cramp and spasm     Problem List Patient Active Problem List   Diagnosis Date Noted  . 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 Neck muscle spasm 12/14/2019  . Myocardiopathy ([place]) 08/18/2018  . Valvular heart disease 08/18/2018  . Lung cancer ([place]) 05/31/2018  . Hyperlipidemia LDL goal <70 03/22/2018  . Morbid obesity ([place]) 12/22/2017  . Insomnia 12/07/2017  . OA (osteoarthritis) of knee 07/08/2017  . Chronic anticoagulation 07/08/2017  . Paroxysmal atrial fibrillation ([facility])   . Coronary artery disease 02/24/2017  . Ischemic cardiomyopathy 02/24/2017  . Rheumatic heart disease 02/24/2017  . Status post mitral valve  replacement 02/24/2017  . Chronic systolic heart failure ([place]) 01/02/2017  . HTN (hypertension) 01/02/2017   10:24 AM, 03/25/20 Margarito Liner, SPT Student Physical Therapist [place]  [phone]  Margarito Liner 03/25/2020, 10:23 AM  [facility] [address] [place], [address] Phone: [phone]   Fax:  [phone]  Name: Vanessa Carlson MRN: [id number] Date of Birth: 1961/05/31

## 2020-03-27 ENCOUNTER — Ambulatory Visit: Payer: Medicare Other

## 2020-03-27 ENCOUNTER — Other Ambulatory Visit: Payer: Self-pay

## 2020-03-27 DIAGNOSIS — M25661 Stiffness of right knee, not elsewhere classified: Secondary | ICD-10-CM

## 2020-03-27 DIAGNOSIS — M25561 Pain in right knee: Secondary | ICD-10-CM | POA: Diagnosis not present

## 2020-03-27 DIAGNOSIS — R252 Cramp and spasm: Secondary | ICD-10-CM | POA: Diagnosis not present

## 2020-03-27 DIAGNOSIS — G8929 Other chronic pain: Secondary | ICD-10-CM | POA: Diagnosis not present

## 2020-03-27 NOTE — Therapy (Signed)
[facility] [address], [place], [address] Phone: [phone]   Fax:  [phone]  Physical Therapy Treatment  Patient Details  Name: Vanessa Carlson MRN: [id number] Date of Birth: March 18, 1961 Referring Provider (PT): Brita Romp MD   Encounter Date: 03/27/2020   PT End of Session - 03/27/20 0829    Visit Number 4    Number of Visits 13    Date for PT Re-Evaluation 04/29/20    PT Start Time 0825    PT Stop Time 0910    PT Time Calculation (min) 45 min    Activity Tolerance Patient tolerated treatment well    Behavior During Therapy [facility] for tasks assessed/performed           Past Medical History:  Diagnosis Date   AICD (automatic cardioverter/defibrillator) present    Anterior myocardial infarction ([place])    a. 10/2014 - occluded LAD, complicated by cardiogenic shock-->Med Rx as interventional team was unable to open LAD.   Cancer of upper lobe of right lung ([place]) 09/2017   radiation therapy right side   Cervical cancer ([place])    in remission for ~20 yrs, s/p hysterectomy   Chronic combined systolic and diastolic CHF (congestive heart failure) ([place])    a. 10/2015 Echo: EF 20-25%, sev diast dysfxn, mildly reduced RV fxn. Nl MV prosthesis; b. 12/2017 Echo: EF 20-25%, diff HK. Sev ant/antsept HK, apical AK. Mild MS (mean graad 70mmHg). Mod TR. PASP 3mmHg.   Coronary artery disease    a. 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   Essential hypertension    Ischemic cardiomyopathy    a. 03/2015 s/p MDT single lead AICD;  b. 10/2015 Echo: EF 20-25%;  c. 12/2017 Echo: EF 20-25%, diff HK.   Mass of upper lobe of right lung    a. noted on CXR & CT 08/2017 w/ abnl PET CT.   Mitral valve disease    a. 2006 s/p MVR w/ 42mm SJM bileaflet mechanical valve-->chronic coumadin/ASA; b. 12/2017 Echo: Mild MS. Mean gradient 30mmHg.    Personal history of radiation therapy    f/u lung ca    Past Surgical History:  Procedure Laterality Date   APPENDECTOMY  1998   BREAST EXCISIONAL BIOPSY Left 80s   benign   CARDIAC CATHETERIZATION     CARDIAC DEFIBRILLATOR PLACEMENT     COLONOSCOPY WITH PROPOFOL N/A 01/11/2020   Procedure: COLONOSCOPY WITH PROPOFOL;  Surgeon: Jonathon Bellows, MD;  Location: [facility] ENDOSCOPY;  Service: Gastroenterology;  Laterality: N/A;   RADICAL HYSTERECTOMY  1998   SHOULDER SURGERY Bilateral    rotator cuff tears   VALVE REPLACEMENT     Mitral valve; 27 mm St. Jude bileaflet valve    There were no vitals filed for this visit.   Subjective Assessment - 03/27/20 0825    Subjective Patient reports knee pain after last session and associates this with mini squats at last session.    Pertinent History Hx of CA, insidious onset of L sided neck pain 3 weeks prior, B rotator cuff repair  surgery (unknown level of severity), MI, R knee pain 6 months ago    Limitations Lifting    Diagnostic tests none    Patient Stated Goals To avoid knee replacement; Walk easier.    Currently in Pain? Yes    Pain Score 5     Pain Location Knee  Pain Onset More than a month ago          INTERVENTIONS    Therapeutic Exercise Glute Bridge 2x12 Supine Hip ER 2x10 (with green Tband) SLR 2x10 with 2lb AW Bilat. Standing Hip Abduction 2x10 with 2lb AW Bilat. Standing Hip Extension 2x10 with 2lb AW Bilat. OMEGA Knee Extension 3x10 with 10lbs Total Gym 21.5 3x15  Step-up 2x8 bilat    Performed exercises to improve hip strength and stability of LE     PT Education - 03/27/20 0829    Education Details form/technique with exercise    Person(s) Educated Patient    Methods Explanation;Demonstration    Comprehension Verbalized understanding;Returned demonstration            PT Short Term Goals - 03/19/20 1035      PT SHORT TERM GOAL #1   Title Patient will be independent with HEP to continue  benefits of therapy until after discharge.    Baseline Dependent for form/technique    Time 6    Period Weeks    Status New    Target Date 04/08/20             PT Long Term Goals - 03/19/20 1036      PT LONG TERM GOAL #1   Title Patient will have a worst pain of a 1/10 to indicate significant improvement in pain and spasms most notably while using stairs.    Baseline 8/10    Time 6    Period Weeks    Status New    Target Date 04/29/20      PT LONG TERM GOAL #2   Title Patient will improve her FOTO score to 65 to indicate improvement in functional mobility with less pain.    Baseline 03/18/20: 57    Time 6    Period Weeks    Status New    Target Date 04/29/20      PT LONG TERM GOAL #3   Title Patient will be able to ascend and descend stairs without an increase in pain to allow for comfortable home and community ambulation.    Baseline Pain ascending/descending staits    Time 6    Period Weeks    Status New    Target Date 04/29/20      PT LONG TERM GOAL #4   Title Patient will be able to perform > 1 hour of walking to return to recreational activities for fitness    Baseline 20 min before requiring a sitting rest break    Time 6    Period Weeks    Status New    Target Date 04/29/20                 Plan - 03/27/20 0830    Clinical Impression Statement Continued strengthening and stability exercises of the LEs during todays session.  Patient had mild pain at the beginning of the session which decreased by the end of the session after performing exercises.  Patient responded well to using the total gym to help decrease knee pain that was occurring when she performed mini squats.  Patient will benefit from further skilled therapy to return to prior level of function.    Personal Factors and Comorbidities Comorbidity 3+    Comorbidities MI, hx of CA, B RCR surgery    Examination-Activity Limitations Sit;Lift;Squat;Stairs;Locomotion Level     Examination-Participation Restrictions Community Activity;Other    Stability/Clinical Decision Making Stable/Uncomplicated    Clinical Decision Making Low  Rehab Potential Good    PT Frequency 2x / week    PT Duration 6 weeks    PT Treatment/Interventions ADLs/Self Care Home Management;Biofeedback;Electrical Stimulation;Moist Heat;Cryotherapy;Gait training;Stair training;Functional mobility training;Therapeutic activities;Therapeutic exercise;Balance training;Neuromuscular re-education;Patient/family education;Manual techniques;Passive range of motion;Dry needling;Taping;Spinal Manipulations;Joint Manipulations    PT Next Visit Plan Address muscular endurance and continue with LE Strengthening    PT Home Exercise Plan STS, LAQ, Step-ups    Consulted and Agree with Plan of Care Patient           Patient will benefit from skilled therapeutic intervention in order to improve the following deficits and impairments:  Pain, Decreased mobility, Postural dysfunction, Decreased endurance, Decreased range of motion, Decreased strength, Decreased activity tolerance, Abnormal gait, Hypomobility, Improper body mechanics  Visit Diagnosis: Chronic pain of right knee  Stiffness of right knee, not elsewhere classified  Cramp and spasm     Problem List Patient Active Problem List   Diagnosis Date Noted   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) 08/18/2018   Valvular heart disease 08/18/2018   Lung cancer ([place]) 05/31/2018   Hyperlipidemia LDL goal <70 03/22/2018   Morbid obesity ([place]) 12/22/2017   Insomnia 12/07/2017   OA (osteoarthritis) of knee 07/08/2017   Chronic anticoagulation 07/08/2017   Paroxysmal atrial fibrillation ([place])    Coronary artery disease 02/24/2017   Ischemic cardiomyopathy 02/24/2017   Rheumatic heart disease 02/24/2017   Status post mitral valve replacement 27/61/4709   Chronic systolic  heart failure ([place]) 01/02/2017   HTN (hypertension) 01/02/2017   9:17 AM, 03/27/20 Margarito Liner, SPT Student Physical Therapist [place]  Margarito Liner 03/27/2020, 9:16 AM  [facility] [address], [place], [address] Phone: [phone]   Fax:  [phone]  Name: Vanessa Carlson MRN: [id number] Date of Birth: [birth year]

## 2020-03-31 ENCOUNTER — Ambulatory Visit: Payer: Medicare Other

## 2020-04-03 ENCOUNTER — Ambulatory Visit: Payer: Medicare Other

## 2020-04-06 ENCOUNTER — Encounter: Payer: Self-pay | Admitting: Family Medicine

## 2020-04-07 ENCOUNTER — Ambulatory Visit: Payer: Medicare Other

## 2020-04-07 ENCOUNTER — Telehealth: Payer: Self-pay

## 2020-04-07 MED ORDER — WARFARIN SODIUM 7.5 MG PO TABS: 7.5000 mg | ORAL_TABLET | Freq: Every day | ORAL | 3 refills | Status: DC

## 2020-04-07 NOTE — Telephone Encounter (Signed)
RX sent to pharmacy  

## 2020-04-08 ENCOUNTER — Other Ambulatory Visit: Payer: Self-pay

## 2020-04-08 ENCOUNTER — Ambulatory Visit (INDEPENDENT_AMBULATORY_CARE_PROVIDER_SITE_OTHER): Payer: Medicare Other

## 2020-04-08 DIAGNOSIS — Z952 Presence of prosthetic heart valve: Secondary | ICD-10-CM | POA: Diagnosis not present

## 2020-04-08 DIAGNOSIS — I480 Paroxysmal atrial fibrillation: Secondary | ICD-10-CM | POA: Diagnosis not present

## 2020-04-08 LAB — POCT INR
INR: 3.1 — AB (ref 2.0–3.0)
Prothrombin Time: 37.3

## 2020-04-08 NOTE — Patient Instructions (Signed)
Description    Continue current dose 7.5mg  daily execpt on Tue. take 10mg  .  Recheck in four  weeks.

## 2020-04-09 ENCOUNTER — Other Ambulatory Visit: Payer: Self-pay | Admitting: Physician Assistant

## 2020-04-09 ENCOUNTER — Other Ambulatory Visit: Payer: Self-pay | Admitting: Family Medicine

## 2020-04-09 DIAGNOSIS — M545 Low back pain, unspecified: Secondary | ICD-10-CM

## 2020-04-09 NOTE — Telephone Encounter (Signed)
Requested medication (s) are due for refill today -yes  Requested medication (s) are on the active medication list -yes  Future visit scheduled -no  Last refill: 09/17/19 #30 5RF  Notes to clinic: Request for non delegated Rx- may be duplicate request  Requested Prescriptions  Pending Prescriptions Disp Refills   traMADol (ULTRAM) 50 MG tablet 30 tablet 5    Sig: Take 1 tablet (50 mg total) by mouth every 6 (six) hours as needed.      Not Delegated - Analgesics:  Opioid Agonists Failed - 04/09/2020  4:03 PM      Failed - This refill cannot be delegated      Failed - Urine Drug Screen completed in last 360 days.      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[facility])   [employer], Clearnce Sorrel, [place]   1 year ago Constipation, unspecified constipation type   [facility], Wendee Beavers, PA-C   1 year ago Essential hypertension   [place], Dionne Bucy, MD       Future Appointments             In 2 months Bac[place]al[place], Dionne Bucy, MD [facility], [facility]   In 2 months [place], Harrell Gave, MD [facility], [facility]                Requested Prescriptions  Pending Prescriptions Disp Refills   traMADol (ULTRAM) 50 MG tablet 30 tablet 5    Sig: Take 1 tablet (50 mg total) by mouth every 6 (six) hours as needed.      Not Delegated - Analgesics:  Opioid Agonists Failed - 04/09/2020  4:03 PM      Failed - This refill cannot be delegated      Failed - Urine Drug Screen completed in last 360 days.      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

## 2020-04-09 NOTE — Telephone Encounter (Signed)
Copied from [place] [phone]. Topic: Quick Communication - Rx Refill/Question >> Apr 09, 2020  3:57 PM Leward Quan A wrote: Medication: traMADol (ULTRAM) 50 MG tablet   Has the patient contacted their pharmacy? Yes.   (Agent: If no, request that the patient contact the pharmacy for the refill.) (Agent: If yes, when and what did the pharmacy advise?)  Preferred Pharmacy (with phone number or street name): [facility]), [place]  Phone:  [phone] Fax:  [phone]     Agent: Please be advised that RX refills may take up to 3 business days. We ask that you follow-up with your pharmacy.

## 2020-04-09 NOTE — Telephone Encounter (Signed)
Requested medication (s) are due for refill today: yes  Requested medication (s) are on the active medication list: yes  Last refill: 09/17/19  # 30  5 refills  Future visit scheduled no  Notes to clinic: Not delegated  Requested Prescriptions  Pending Prescriptions Disp Refills   traMADol (ULTRAM) 50 MG tablet [Pharmacy Med Name: traMADol HCl 50 MG Oral Tablet] 30 tablet 0    Sig: TAKE 1 TABLET BY MOUTH EVERY 6 HOURS AS NEEDED      Not Delegated - Analgesics:  Opioid Agonists Failed - 04/09/2020 10:31 AM      Failed - This refill cannot be delegated      Failed - Urine Drug Screen completed in last 360 days.      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

## 2020-04-10 ENCOUNTER — Ambulatory Visit: Payer: Medicare Other | Attending: Family Medicine

## 2020-04-10 ENCOUNTER — Other Ambulatory Visit: Payer: Self-pay

## 2020-04-10 DIAGNOSIS — G8929 Other chronic pain: Secondary | ICD-10-CM | POA: Diagnosis not present

## 2020-04-10 DIAGNOSIS — R252 Cramp and spasm: Secondary | ICD-10-CM | POA: Insufficient documentation

## 2020-04-10 DIAGNOSIS — M25661 Stiffness of right knee, not elsewhere classified: Secondary | ICD-10-CM | POA: Insufficient documentation

## 2020-04-10 DIAGNOSIS — M25561 Pain in right knee: Secondary | ICD-10-CM | POA: Diagnosis not present

## 2020-04-10 NOTE — Therapy (Signed)
[place] [facility] [address], [place], [address] Phone: [phone]   Fax:  [phone]  Physical Therapy Treatment  Patient Details  Name: Vanessa Carlson MRN: [id number] Date of Birth: June 22, 1961 Referring Provider (PT): Brita Romp MD   Encounter Date: 04/10/2020   PT End of Session - 04/10/20 1118    Visit Number 5    Number of Visits 13    Date for PT Re-Evaluation 04/29/20    PT Start Time 1115    PT Stop Time 1200    PT Time Calculation (min) 45 min    Activity Tolerance Patient tolerated treatment well    Behavior During Therapy [facility] for tasks assessed/performed           Past Medical History:  Diagnosis Date   AICD (automatic cardioverter/defibrillator) present    Anterior myocardial infarction ([place])    a. 10/2014 - occluded LAD, complicated by cardiogenic shock-->Med Rx as interventional team was unable to open LAD.   Cancer of upper lobe of right lung ([place]) 09/2017   radiation therapy right side   Cervical cancer ([place])    in remission for ~20 yrs, s/p hysterectomy   Chronic combined systolic and diastolic CHF (congestive heart failure) ([place])    a. 10/2015 Echo: EF 20-25%, sev diast dysfxn, mildly reduced RV fxn. Nl MV prosthesis; b. 12/2017 Echo: EF 20-25%, diff HK. Sev ant/antsept HK, apical AK. Mild MS (mean graad 32mmHg). Mod TR. PASP 65mmHg.   Coronary artery disease    a. 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   Essential hypertension    Ischemic cardiomyopathy    a. 03/2015 s/p MDT single lead AICD;  b. 10/2015 Echo: EF 20-25%;  c. 12/2017 Echo: EF 20-25%, diff HK.   Mass of upper lobe of right lung    a. noted on CXR & CT 08/2017 w/ abnl PET CT.   Mitral valve disease    a. 2006 s/p MVR w/ 38mm SJM bileaflet mechanical valve-->chronic coumadin/ASA; b. 12/2017 Echo: Mild MS. Mean gradient 68mmHg.    Personal history of radiation therapy    f/u lung ca    Past Surgical History:  Procedure Laterality Date   APPENDECTOMY  1998   BREAST EXCISIONAL BIOPSY Left 80s   benign   CARDIAC CATHETERIZATION     CARDIAC DEFIBRILLATOR PLACEMENT     COLONOSCOPY WITH PROPOFOL N/A 01/11/2020   Procedure: COLONOSCOPY WITH PROPOFOL;  Surgeon: Jonathon Bellows, MD;  Location: [facility] ENDOSCOPY;  Service: Gastroenterology;  Laterality: N/A;   RADICAL HYSTERECTOMY  1998   SHOULDER SURGERY Bilateral    rotator cuff tears   VALVE REPLACEMENT     Mitral valve; 27 mm St. Jude bileaflet valve    There were no vitals filed for this visit.   Subjective Assessment - 04/10/20 1116    Subjective Patient reports that she recently had an episode of walking in a store for around a hour that caused severe R knee pain.    Pertinent History Hx of CA, insidious onset of L sided neck pain 3 weeks prior, B rotator cuff repair  surgery (unknown level of severity), MI, R knee pain 6 months ago    Limitations Lifting    Diagnostic tests none    Patient Stated Goals To avoid knee replacement; Walk easier.    Currently in Pain? Yes    Pain Score 6  Pain Location Knee    Pain Onset More than a month ago          INTERVENTIONS    Therapeutic Exercise Glute Bridge 2x12 Supine Hip adductor 2x10 (with green Tband) SLR 2x10 with 2lb AW Bilat. Standing Hip Abduction 2x10 with 2lb AW Bilat. Standing Hip Extension 2x10 with 2lb AW Bilat. OMEGA Knee Extension 2x15 with 15lbs Total Gym 21.5 3x15  Step-up 2x8 bilat with 2lb AW   Performed exercises to improve hip strength and stability of LE     PT Education - 04/10/20 1118    Education Details form/technique with exercise    Person(s) Educated Patient    Methods Explanation;Demonstration    Comprehension Verbalized understanding;Returned demonstration            PT Short Term Goals - 03/19/20 1035      PT SHORT TERM GOAL #1   Title Patient will be  independent with HEP to continue benefits of therapy until after discharge.    Baseline Dependent for form/technique    Time 6    Period Weeks    Status New    Target Date 04/08/20             PT Long Term Goals - 03/19/20 1036      PT LONG TERM GOAL #1   Title Patient will have a worst pain of a 1/10 to indicate significant improvement in pain and spasms most notably while using stairs.    Baseline 8/10    Time 6    Period Weeks    Status New    Target Date 04/29/20      PT LONG TERM GOAL #2   Title Patient will improve her FOTO score to 65 to indicate improvement in functional mobility with less pain.    Baseline 03/18/20: 57    Time 6    Period Weeks    Status New    Target Date 04/29/20      PT LONG TERM GOAL #3   Title Patient will be able to ascend and descend stairs without an increase in pain to allow for comfortable home and community ambulation.    Baseline Pain ascending/descending staits    Time 6    Period Weeks    Status New    Target Date 04/29/20      PT LONG TERM GOAL #4   Title Patient will be able to perform > 1 hour of walking to return to recreational activities for fitness    Baseline 20 min before requiring a sitting rest break    Time 6    Period Weeks    Status New    Target Date 04/29/20                 Plan - 04/10/20 1119    Clinical Impression Statement Addressed patient medial knee pain at beginning of session and found that patient had tenderness upon palpation to adductors of R LE.  Began exercises to work on strengthening adductors at which patient tolerated well with no increase in pain.  Patients soreness and pain decreased throughout session after performing exercises.  Patients LE strength is improving; however, she continues to have knee pain.  Patient will benefit from further skilled therapy to return to prior level of function.    Personal Factors and Comorbidities Comorbidity 3+    Comorbidities MI, hx of CA, B RCR  surgery    Examination-Activity Limitations Sit;Lift;Squat;Stairs;Locomotion Level    Examination-Participation Restrictions  Community Activity;Other    Stability/Clinical Decision Making Stable/Uncomplicated    Clinical Decision Making Low    Rehab Potential Good    PT Frequency 2x / week    PT Duration 6 weeks    PT Treatment/Interventions ADLs/Self Care Home Management;Biofeedback;Electrical Stimulation;Moist Heat;Cryotherapy;Gait training;Stair training;Functional mobility training;Therapeutic activities;Therapeutic exercise;Balance training;Neuromuscular re-education;Patient/family education;Manual techniques;Passive range of motion;Dry needling;Taping;Spinal Manipulations;Joint Manipulations    PT Next Visit Plan Address muscular endurance and continue with LE Strengthening    PT Home Exercise Plan STS, LAQ, Step-ups    Consulted and Agree with Plan of Care Patient           Patient will benefit from skilled therapeutic intervention in order to improve the following deficits and impairments:  Pain, Decreased mobility, Postural dysfunction, Decreased endurance, Decreased range of motion, Decreased strength, Decreased activity tolerance, Abnormal gait, Hypomobility, Improper body mechanics  Visit Diagnosis: Chronic pain of right knee  Stiffness of right knee, not elsewhere classified  Cramp and spasm     Problem List Patient Active Problem List   Diagnosis Date Noted   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[place]) 12/14/2019   Neck muscle spasm 12/14/2019   Myocardiopathy ([place]) 08/18/2018   Valvular heart disease 08/18/2018   Lung cancer ([place]) 05/31/2018   Hyperlipidemia LDL goal <70 03/22/2018   Morbid obesity ([place]) 12/22/2017   Insomnia 12/07/2017   OA (osteoarthritis) of knee 07/08/2017   Chronic anticoagulation 07/08/2017   Paroxysmal atrial fibrillation ([place])    Coronary artery disease 02/24/2017   Ischemic cardiomyopathy 02/24/2017   Rheumatic heart  disease 02/24/2017   Status post mitral valve replacement 83/15/1761   Chronic systolic heart failure ([place]) 01/02/2017   HTN (hypertension) 01/02/2017   12:00 PM, 04/10/20 Margarito Liner, SPT Student Physical Therapist [place]  Margarito Liner 04/10/2020, 11:50 AM  [place] [facility] [address] [place], [address] Phone: [phone]   Fax:  [phone]  Name: Vanessa Carlson MRN: [id number] Date of Birth: 01/21/1961

## 2020-04-15 ENCOUNTER — Other Ambulatory Visit: Payer: Self-pay

## 2020-04-15 ENCOUNTER — Ambulatory Visit: Payer: Medicare Other

## 2020-04-15 DIAGNOSIS — G8929 Other chronic pain: Secondary | ICD-10-CM | POA: Diagnosis not present

## 2020-04-15 DIAGNOSIS — M25661 Stiffness of right knee, not elsewhere classified: Secondary | ICD-10-CM | POA: Diagnosis not present

## 2020-04-15 DIAGNOSIS — M25561 Pain in right knee: Secondary | ICD-10-CM

## 2020-04-15 DIAGNOSIS — R252 Cramp and spasm: Secondary | ICD-10-CM

## 2020-04-15 NOTE — Therapy (Signed)
[place] [facility] [address], [place], [address] Phone: [phone]   Fax:  [phone]  Physical Therapy Treatment  Patient Details  Name: Vanessa Carlson MRN: [id number] Date of Birth: [birth year] Referring Provider (PT): Brita Romp MD   Encounter Date: 04/15/2020   PT End of Session - 04/15/20 0955    Visit Number 6    Number of Visits 13    Date for PT Re-Evaluation 04/29/20    PT Start Time 0949    PT Stop Time 1030    PT Time Calculation (min) 41 min    Activity Tolerance Patient tolerated treatment well    Behavior During Therapy [facility] for tasks assessed/performed           Past Medical History:  Diagnosis Date  . AICD (automatic cardioverter/defibrillator) present   . Anterior myocardial infarction [facility])    a. 10/2014 - occluded LAD, complicated by cardiogenic shock-->Med Rx as interventional team was unable to open LAD.  [name] Cancer of upper lobe of right lung ([place]) 09/2017   radiation therapy right side  . Cervical cancer ([place])    in remission for ~20 yrs, s/p hysterectomy  . Chronic combined systolic and diastolic CHF (congestive heart failure) ([place])    a. 10/2015 Echo: EF 20-25%, sev diast dysfxn, mildly reduced RV fxn. Nl MV prosthesis; b. 12/2017 Echo: EF 20-25%, diff HK. Sev ant/antsept HK, apical AK. Mild MS (mean graad 50mmHg). Mod TR. PASP 81mmHg.  [name] Coronary artery disease    a. 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  . Essential hypertension   . Ischemic cardiomyopathy    a. 03/2015 s/p MDT single lead AICD;  b. 10/2015 Echo: EF 20-25%;  c. 12/2017 Echo: EF 20-25%, diff HK.  . Mass of upper lobe of right lung    a. noted on CXR & CT 08/2017 w/ abnl PET CT.  [name] Mitral valve disease    a. 2006 s/p MVR w/ 54mm SJM bileaflet mechanical valve-->chronic coumadin/ASA; b. 12/2017 Echo: Mild MS. Mean gradient 8mmHg.   [name] Personal history of radiation therapy    f/u lung ca    Past Surgical History:  Procedure Laterality Date  . APPENDECTOMY  1998  . BREAST EXCISIONAL BIOPSY Left 80s   benign  . CARDIAC CATHETERIZATION    . CARDIAC DEFIBRILLATOR PLACEMENT    . COLONOSCOPY WITH PROPOFOL N/A 01/11/2020   Procedure: COLONOSCOPY WITH PROPOFOL;  Surgeon: Jonathon Bellows, MD;  Location: [facility] ENDOSCOPY;  Service: Gastroenterology;  Laterality: N/A;  . RADICAL HYSTERECTOMY  1998  . SHOULDER SURGERY Bilateral    rotator cuff tears  . VALVE REPLACEMENT     Mitral valve; 27 mm St. Jude bileaflet valve    There were no vitals filed for this visit.   Subjective Assessment - 04/15/20 0950    Subjective Patient reports no pain currently and states her knee has been "doing good".    Pertinent History Hx of CA, insidious onset of L sided neck pain 3 weeks prior, B rotator cuff repair  surgery (unknown level of severity), MI, R knee pain 6 months ago    Limitations Lifting    Diagnostic tests none    Patient Stated Goals To avoid knee replacement; Walk easier.    Currently in Pain? No/denies    Pain Onset More than a month ago  INTERVENTIONS     Therapeutic Exercise Sidelying hip adduction on the affected side - x 20 ; x20 5# Supine Hip adductor x20 (with green Tband) Total Gym B Squats level 22 - x 20 ; level 23 - x 20 ; level 24 - x 20  Hip machine abduction with B UE support - 40# x 20 B; 70# adduction x20 B Running man in standing with UE support - x 15 B   Performed exercises to improve hip strength and stability of LE        PT Education - 04/15/20 0954    Education Details form/technique with exercise    Person(s) Educated Patient    Methods Explanation;Demonstration    Comprehension Verbalized understanding;Returned demonstration            PT Short Term Goals - 03/19/20 1035      PT SHORT TERM GOAL #1   Title Patient will be independent with HEP to continue  benefits of therapy until after discharge.    Baseline Dependent for form/technique    Time 6    Period Weeks    Status New    Target Date 04/08/20             PT Long Term Goals - 03/19/20 1036      PT LONG TERM GOAL #1   Title Patient will have a worst pain of a 1/10 to indicate significant improvement in pain and spasms most notably while using stairs.    Baseline 8/10    Time 6    Period Weeks    Status New    Target Date 04/29/20      PT LONG TERM GOAL #2   Title Patient will improve her FOTO score to 65 to indicate improvement in functional mobility with less pain.    Baseline 03/18/20: 57    Time 6    Period Weeks    Status New    Target Date 04/29/20      PT LONG TERM GOAL #3   Title Patient will be able to ascend and descend stairs without an increase in pain to allow for comfortable home and community ambulation.    Baseline Pain ascending/descending staits    Time 6    Period Weeks    Status New    Target Date 04/29/20      PT LONG TERM GOAL #4   Title Patient will be able to perform > 1 hour of walking to return to recreational activities for fitness    Baseline 20 min before requiring a sitting rest break    Time 6    Period Weeks    Status New    Target Date 04/29/20                 Plan - 04/15/20 1004    Clinical Impression Statement Patient able to tolerate advancement of exercises without increased pain. Focused on strengthening the hips today to decrease likelihood of knee pain in the future. Increased fatigue noted at the end of the session indicating decreased muscular strength and endurance. Patient will benefit from further skilled therapy to return to prior level of function.    Personal Factors and Comorbidities Comorbidity 3+    Comorbidities MI, hx of CA, B RCR surgery    Examination-Activity Limitations Sit;Lift;Squat;Stairs;Locomotion Level    Examination-Participation Restrictions Community Activity;Other    Stability/Clinical  Decision Making Stable/Uncomplicated    Rehab Potential Good    PT Frequency 2x / week  PT Duration 6 weeks    PT Treatment/Interventions ADLs/Self Care Home Management;Biofeedback;Electrical Stimulation;Moist Heat;Cryotherapy;Gait training;Stair training;Functional mobility training;Therapeutic activities;Therapeutic exercise;Balance training;Neuromuscular re-education;Patient/family education;Manual techniques;Passive range of motion;Dry needling;Taping;Spinal Manipulations;Joint Manipulations    PT Next Visit Plan Address muscular endurance and continue with LE Strengthening    PT Home Exercise Plan STS, LAQ, Step-ups    Consulted and Agree with Plan of Care Patient           Patient will benefit from skilled therapeutic intervention in order to improve the following deficits and impairments:  Pain, Decreased mobility, Postural dysfunction, Decreased endurance, Decreased range of motion, Decreased strength, Decreased activity tolerance, Abnormal gait, Hypomobility, Improper body mechanics  Visit Diagnosis: Chronic pain of right knee  Stiffness of right knee, not elsewhere classified  Cramp and spasm     Problem List Patient Active Problem List   Diagnosis Date Noted  . 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 Neck muscle spasm 12/14/2019  . Myocardiopathy ([place]) 08/18/2018  . Valvular heart disease 08/18/2018  . Lung cancer ([place]) 05/31/2018  . Hyperlipidemia LDL goal <70 03/22/2018  . Morbid obesity ([place]) 12/22/2017  . Insomnia 12/07/2017  . OA (osteoarthritis) of knee 07/08/2017  . Chronic anticoagulation 07/08/2017  . Paroxysmal atrial fibrillation ([facility])   . Coronary artery disease 02/24/2017  . Ischemic cardiomyopathy 02/24/2017  . Rheumatic heart disease 02/24/2017  . Status post mitral valve replacement 02/24/2017  . Chronic systolic heart failure ([place]) 01/02/2017  . HTN (hypertension) 01/02/2017    Blythe Stanford, PT DPT 04/15/2020, 10:32 AM  [place] [facility] [address], [place], [address] Phone: [phone]   Fax:  [phone]  Name: Vanessa Carlson MRN: [id number] Date of Birth: 1961/06/03

## 2020-04-21 ENCOUNTER — Other Ambulatory Visit: Payer: Self-pay

## 2020-04-21 ENCOUNTER — Ambulatory Visit: Payer: Medicare Other

## 2020-04-21 DIAGNOSIS — M25561 Pain in right knee: Secondary | ICD-10-CM | POA: Diagnosis not present

## 2020-04-21 DIAGNOSIS — R252 Cramp and spasm: Secondary | ICD-10-CM

## 2020-04-21 DIAGNOSIS — M25661 Stiffness of right knee, not elsewhere classified: Secondary | ICD-10-CM

## 2020-04-21 DIAGNOSIS — G8929 Other chronic pain: Secondary | ICD-10-CM

## 2020-04-21 NOTE — Therapy (Signed)
[facility] [address], [place], [address] Phone: [phone]   Fax:  [phone]  Physical Therapy Treatment  Patient Details  Name: Vanessa Carlson MRN: [id number] Date of Birth: 04/22/1961 Referring Provider (PT): Brita Romp MD   Encounter Date: 04/21/2020   PT End of Session - 04/21/20 1604    Visit Number 7    Number of Visits 13    Date for PT Re-Evaluation 04/29/20    PT Start Time 1600    PT Stop Time 1645    PT Time Calculation (min) 45 min    Activity Tolerance Patient tolerated treatment well    Behavior During Therapy [facility] for tasks assessed/performed           Past Medical History:  Diagnosis Date  . AICD (automatic cardioverter/defibrillator) present   . Anterior myocardial infarction [facility])    a. 10/2014 - occluded LAD, complicated by cardiogenic shock-->Med Rx as interventional team was unable to open LAD.  [name] Cancer of upper lobe of right lung ([place]) 09/2017   radiation therapy right side  . Cervical cancer ([place])    in remission for ~20 yrs, s/p hysterectomy  . Chronic combined systolic and diastolic CHF (congestive heart failure) ([place])    a. 10/2015 Echo: EF 20-25%, sev diast dysfxn, mildly reduced RV fxn. Nl MV prosthesis; b. 12/2017 Echo: EF 20-25%, diff HK. Sev ant/antsept HK, apical AK. Mild MS (mean graad 77mmHg). Mod TR. PASP 18mmHg.  [name] Coronary artery disease    a. 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  . Essential hypertension   . Ischemic cardiomyopathy    a. 03/2015 s/p MDT single lead AICD;  b. 10/2015 Echo: EF 20-25%;  c. 12/2017 Echo: EF 20-25%, diff HK.  . Mass of upper lobe of right lung    a. noted on CXR & CT 08/2017 w/ abnl PET CT.  [name] Mitral valve disease    a. 2006 s/p MVR w/ 87mm SJM bileaflet mechanical valve-->chronic coumadin/ASA; b. 12/2017 Echo: Mild MS. Mean gradient 69mmHg.   [name] Personal history of radiation therapy    f/u lung ca    Past Surgical History:  Procedure Laterality Date  . APPENDECTOMY  1998  . BREAST EXCISIONAL BIOPSY Left 80s   benign  . CARDIAC CATHETERIZATION    . CARDIAC DEFIBRILLATOR PLACEMENT    . COLONOSCOPY WITH PROPOFOL N/A 01/11/2020   Procedure: COLONOSCOPY WITH PROPOFOL;  Surgeon: Jonathon Bellows, MD;  Location: [facility] ENDOSCOPY;  Service: Gastroenterology;  Laterality: N/A;  . RADICAL HYSTERECTOMY  1998  . SHOULDER SURGERY Bilateral    rotator cuff tears  . VALVE REPLACEMENT     Mitral valve; 27 mm St. Jude bileaflet valve    There were no vitals filed for this visit.   Subjective Assessment - 04/21/20 1603    Subjective Patient reports that her knee is feeling good at todays session.    Pertinent History Hx of CA, insidious onset of L sided neck pain 3 weeks prior, B rotator cuff repair  surgery (unknown level of severity), MI, R knee pain 6 months ago    Limitations Lifting    Diagnostic tests none    Patient Stated Goals To avoid knee replacement; Walk easier.    Currently in Pain? No/denies    Pain Onset More than a month ago          INTERVENTIONS  Therapeutic Exercise Side-lying hip adduction on the affected side - x 20 ; x20 5# Supine Hip adductor x20 (with green Tband) Total Gym B Squats: level 23 - x 20; level 24 - x 20; level 25 x20  Leg Extension at [facility] 2x15 10lbs  Hip machine abduction with B UE support - 40# x 20 B Hip Machine flexion with B UE Support 40# x20 B  Lunges with UE Support x10 B   Performed exercises to improve hip strength and stability of LE     PT Education - 04/21/20 1603    Education Details form/technique with exercise    Person(s) Educated Patient    Methods Explanation;Demonstration    Comprehension Verbalized understanding;Returned demonstration            PT Short Term Goals - 03/19/20 1035      PT SHORT TERM GOAL #1   Title Patient will be independent with HEP to  continue benefits of therapy until after discharge.    Baseline Dependent for form/technique    Time 6    Period Weeks    Status New    Target Date 04/08/20             PT Long Term Goals - 03/19/20 1036      PT LONG TERM GOAL #1   Title Patient will have a worst pain of a 1/10 to indicate significant improvement in pain and spasms most notably while using stairs.    Baseline 8/10    Time 6    Period Weeks    Status New    Target Date 04/29/20      PT LONG TERM GOAL #2   Title Patient will improve her FOTO score to 65 to indicate improvement in functional mobility with less pain.    Baseline 03/18/20: 57    Time 6    Period Weeks    Status New    Target Date 04/29/20      PT LONG TERM GOAL #3   Title Patient will be able to ascend and descend stairs without an increase in pain to allow for comfortable home and community ambulation.    Baseline Pain ascending/descending staits    Time 6    Period Weeks    Status New    Target Date 04/29/20      PT LONG TERM GOAL #4   Title Patient will be able to perform > 1 hour of walking to return to recreational activities for fitness    Baseline 20 min before requiring a sitting rest break    Time 6    Period Weeks    Status New    Target Date 04/29/20                 Plan - 04/21/20 1604    Clinical Impression Statement Continued to address hip strength and stability during today's session.  Patient tolerated all exercises well without any symptoms arising during the exercises.  Patient continues to improve and is having less pain throughout her day and when performing leisurely activities.  Patient will continue to benefit from skilled therapy to return to prior level of function.    Personal Factors and Comorbidities Comorbidity 3+    Comorbidities MI, hx of CA, B RCR surgery    Examination-Activity Limitations Sit;Lift;Squat;Stairs;Locomotion Level    Examination-Participation Restrictions Community Activity;Other     Stability/Clinical Decision Making Stable/Uncomplicated    Clinical Decision Making Low    Rehab Potential Good  PT Frequency 2x / week    PT Duration 6 weeks    PT Treatment/Interventions ADLs/Self Care Home Management;Biofeedback;Electrical Stimulation;Moist Heat;Cryotherapy;Gait training;Stair training;Functional mobility training;Therapeutic activities;Therapeutic exercise;Balance training;Neuromuscular re-education;Patient/family education;Manual techniques;Passive range of motion;Dry needling;Taping;Spinal Manipulations;Joint Manipulations    PT Next Visit Plan Address muscular endurance and continue with LE Strengthening    PT Home Exercise Plan STS, LAQ, Step-ups    Consulted and Agree with Plan of Care Patient           Patient will benefit from skilled therapeutic intervention in order to improve the following deficits and impairments:  Pain, Decreased mobility, Postural dysfunction, Decreased endurance, Decreased range of motion, Decreased strength, Decreased activity tolerance, Abnormal gait, Hypomobility, Improper body mechanics  Visit Diagnosis: Chronic pain of right knee  Stiffness of right knee, not elsewhere classified  Cramp and spasm     Problem List Patient Active Problem List   Diagnosis Date Noted  . 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 Neck muscle spasm 12/14/2019  . Myocardiopathy ([place]) 08/18/2018  . Valvular heart disease 08/18/2018  . Lung cancer ([place]) 05/31/2018  . Hyperlipidemia LDL goal <70 03/22/2018  . Morbid obesity ([place]) 12/22/2017  . Insomnia 12/07/2017  . OA (osteoarthritis) of knee 07/08/2017  . Chronic anticoagulation 07/08/2017  . Paroxysmal atrial fibrillation ([facility])   . Coronary artery disease 02/24/2017  . Ischemic cardiomyopathy 02/24/2017  . Rheumatic heart disease 02/24/2017  . Status post mitral valve replacement 02/24/2017  . Chronic systolic heart failure ([place]) 01/02/2017  . HTN (hypertension) 01/02/2017    4:45 PM, 04/21/20 Margarito Liner, SPT Student Physical Therapist [place]  [phone]  Margarito Liner 04/21/2020, 4:32 PM  [facility] [address], [place], [address] Phone: [phone]   Fax:  [phone]  Name: Vanessa Carlson MRN: [id number] Date of Birth: [birth year]

## 2020-04-23 ENCOUNTER — Encounter: Payer: Self-pay | Admitting: Family Medicine

## 2020-04-23 ENCOUNTER — Other Ambulatory Visit: Payer: Self-pay

## 2020-04-23 ENCOUNTER — Ambulatory Visit: Payer: Medicare Other

## 2020-04-23 DIAGNOSIS — R252 Cramp and spasm: Secondary | ICD-10-CM

## 2020-04-23 DIAGNOSIS — M25661 Stiffness of right knee, not elsewhere classified: Secondary | ICD-10-CM | POA: Diagnosis not present

## 2020-04-23 DIAGNOSIS — G8929 Other chronic pain: Secondary | ICD-10-CM | POA: Diagnosis not present

## 2020-04-23 DIAGNOSIS — M25561 Pain in right knee: Secondary | ICD-10-CM | POA: Diagnosis not present

## 2020-04-23 NOTE — Telephone Encounter (Signed)
Recommend OV with our office or Optho

## 2020-04-23 NOTE — Therapy (Signed)
[facility] [address] [place], [address] Phone: [phone]   Fax:  [phone]  Physical Therapy Treatment  Patient Details  Name: Vanessa Carlson MRN: [id number] Date of Birth: 1960/11/21 Referring Provider (PT): Brita Romp MD   Encounter Date: 04/23/2020   PT End of Session - 04/23/20 1601    Visit Number 8    Number of Visits 13    Date for PT Re-Evaluation 04/29/20    PT Start Time 1600    PT Stop Time 1645    PT Time Calculation (min) 45 min    Activity Tolerance Patient tolerated treatment well    Behavior During Therapy [facility] for tasks assessed/performed           Past Medical History:  Diagnosis Date  . AICD (automatic cardioverter/defibrillator) present   . Anterior myocardial infarction [facility])    a. 10/2014 - occluded LAD, complicated by cardiogenic shock-->Med Rx as interventional team was unable to open LAD.  [name] Cancer of upper lobe of right lung ([place]) 09/2017   radiation therapy right side  . Cervical cancer ([place])    in remission for ~20 yrs, s/p hysterectomy  . Chronic combined systolic and diastolic CHF (congestive heart failure) ([place])    a. 10/2015 Echo: EF 20-25%, sev diast dysfxn, mildly reduced RV fxn. Nl MV prosthesis; b. 12/2017 Echo: EF 20-25%, diff HK. Sev ant/antsept HK, apical AK. Mild MS (mean graad 42mmHg). Mod TR. PASP 43mmHg.  [name] Coronary artery disease    a. 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  . Essential hypertension   . Ischemic cardiomyopathy    a. 03/2015 s/p MDT single lead AICD;  b. 10/2015 Echo: EF 20-25%;  c. 12/2017 Echo: EF 20-25%, diff HK.  . Mass of upper lobe of right lung    a. noted on CXR & CT 08/2017 w/ abnl PET CT.  [name] Mitral valve disease    a. 2006 s/p MVR w/ 63mm SJM bileaflet mechanical valve-->chronic coumadin/ASA; b. 12/2017 Echo: Mild MS. Mean gradient 3mmHg.   [name] Personal history of radiation therapy    f/u lung ca    Past Surgical History:  Procedure Laterality Date  . APPENDECTOMY  1998  . BREAST EXCISIONAL BIOPSY Left 80s   benign  . CARDIAC CATHETERIZATION    . CARDIAC DEFIBRILLATOR PLACEMENT    . COLONOSCOPY WITH PROPOFOL N/A 01/11/2020   Procedure: COLONOSCOPY WITH PROPOFOL;  Surgeon: Jonathon Bellows, MD;  Location: [facility] ENDOSCOPY;  Service: Gastroenterology;  Laterality: N/A;  . RADICAL HYSTERECTOMY  1998  . SHOULDER SURGERY Bilateral    rotator cuff tears  . VALVE REPLACEMENT     Mitral valve; 27 mm St. Jude bileaflet valve    There were no vitals filed for this visit.   Subjective Assessment - 04/23/20 1600    Subjective Patient reports she had soreness after last session and was able to work today without having pain.    Pertinent History Hx of CA, insidious onset of L sided neck pain 3 weeks prior, B rotator cuff repair  surgery (unknown level of severity), MI, R knee pain 6 months ago    Limitations Lifting    Diagnostic tests none    Patient Stated Goals To avoid knee replacement; Walk easier.    Currently in Pain? No/denies    Pain Onset More than a month ago  INTERVENTIONS     Therapeutic Exercise Standing Hip adductor x20 (with green Tband) Total Gym B Squats: level 23 - x 20; level 24 - x 20; level 25 x20  Leg Extension at [employer] 2x15 10lbs  Leg Curl at [facility] 2x15 25lbs  Hip machine Abduction with B UE support - 40# x 20 B Hip Machine Extension with B UE Support 40# x20 B  Step-up on 6-inch Step x12 bilat    Performed exercises to improve hip strength and stability of LE      PT Education - 04/23/20 1601    Education Details form/technique with exercise    Person(s) Educated Patient    Methods Explanation;Demonstration    Comprehension Verbalized understanding;Returned demonstration            PT Short Term Goals - 03/19/20 1035      PT SHORT TERM GOAL #1   Title Patient will be  independent with HEP to continue benefits of therapy until after discharge.    Baseline Dependent for form/technique    Time 6    Period Weeks    Status New    Target Date 04/08/20             PT Long Term Goals - 03/19/20 1036      PT LONG TERM GOAL #1   Title Patient will have a worst pain of a 1/10 to indicate significant improvement in pain and spasms most notably while using stairs.    Baseline 8/10    Time 6    Period Weeks    Status New    Target Date 04/29/20      PT LONG TERM GOAL #2   Title Patient will improve her FOTO score to 65 to indicate improvement in functional mobility with less pain.    Baseline 03/18/20: 57    Time 6    Period Weeks    Status New    Target Date 04/29/20      PT LONG TERM GOAL #3   Title Patient will be able to ascend and descend stairs without an increase in pain to allow for comfortable home and community ambulation.    Baseline Pain ascending/descending staits    Time 6    Period Weeks    Status New    Target Date 04/29/20      PT LONG TERM GOAL #4   Title Patient will be able to perform > 1 hour of walking to return to recreational activities for fitness    Baseline 20 min before requiring a sitting rest break    Time 6    Period Weeks    Status New    Target Date 04/29/20                 Plan - 04/23/20 1602    Clinical Impression Statement Patient able to tolerate todays exercises without increased pain in knee. Focused on hip and knee strengthening to help decrease knee pain by adding stability to joint through strength. Patient had mild muscular fatigue at the end of the session indicating a lack of muscular endurance. Patient will benefit from further skilled therapy to return to prior level of function.    Personal Factors and Comorbidities Comorbidity 3+    Comorbidities MI, hx of CA, B RCR surgery    Examination-Activity Limitations Sit;Lift;Squat;Stairs;Locomotion Level    Examination-Participation  Restrictions Community Activity;Other    Stability/Clinical Decision Making Stable/Uncomplicated    Clinical Decision Making Low  Rehab Potential Good    PT Frequency 2x / week    PT Duration 6 weeks    PT Treatment/Interventions ADLs/Self Care Home Management;Biofeedback;Electrical Stimulation;Moist Heat;Cryotherapy;Gait training;Stair training;Functional mobility training;Therapeutic activities;Therapeutic exercise;Balance training;Neuromuscular re-education;Patient/family education;Manual techniques;Passive range of motion;Dry needling;Taping;Spinal Manipulations;Joint Manipulations    PT Next Visit Plan Address muscular endurance and continue with LE Strengthening    PT Home Exercise Plan STS, LAQ, Step-ups    Consulted and Agree with Plan of Care Patient           Patient will benefit from skilled therapeutic intervention in order to improve the following deficits and impairments:  Pain, Decreased mobility, Postural dysfunction, Decreased endurance, Decreased range of motion, Decreased strength, Decreased activity tolerance, Abnormal gait, Hypomobility, Improper body mechanics  Visit Diagnosis: Chronic pain of right knee  Stiffness of right knee, not elsewhere classified  Cramp and spasm     Problem List Patient Active Problem List   Diagnosis Date Noted  . 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 Neck muscle spasm 12/14/2019  . Myocardiopathy ([place]) 08/18/2018  . Valvular heart disease 08/18/2018  . Lung cancer ([place]) 05/31/2018  . Hyperlipidemia LDL goal <70 03/22/2018  . Morbid obesity ([place]) 12/22/2017  . Insomnia 12/07/2017  . OA (osteoarthritis) of knee 07/08/2017  . Chronic anticoagulation 07/08/2017  . Paroxysmal atrial fibrillation ([facility])   . Coronary artery disease 02/24/2017  . Ischemic cardiomyopathy 02/24/2017  . Rheumatic heart disease 02/24/2017  . Status post mitral valve replacement 02/24/2017  . Chronic systolic heart failure ([place])  01/02/2017  . HTN (hypertension) 01/02/2017   4:45 PM, 04/23/20 Margarito Liner, SPT Student Physical Therapist [place]  [phone]  Margarito Liner 04/23/2020, 4:39 PM  [facility] [address], [place], [address] Phone: [phone]   Fax:  [phone]  Name: Vanessa Carlson MRN: [id number] Date of Birth: 05-Sep-1960

## 2020-04-24 NOTE — Telephone Encounter (Signed)
We can see her.  If there is pain with eye movements, vision changes, then she needs to see Optho urgently.

## 2020-04-25 NOTE — Progress Notes (Signed)
No ICM remote transmission received for 04/21/2020 and next ICM transmission scheduled for 05/12/2020.

## 2020-04-26 ENCOUNTER — Encounter: Payer: Self-pay | Admitting: Physician Assistant

## 2020-04-26 DIAGNOSIS — G6282 Radiation-induced polyneuropathy: Secondary | ICD-10-CM

## 2020-04-28 ENCOUNTER — Ambulatory Visit: Payer: Medicare Other

## 2020-04-28 ENCOUNTER — Other Ambulatory Visit: Payer: Self-pay

## 2020-04-28 DIAGNOSIS — M25661 Stiffness of right knee, not elsewhere classified: Secondary | ICD-10-CM

## 2020-04-28 DIAGNOSIS — M25561 Pain in right knee: Secondary | ICD-10-CM | POA: Diagnosis not present

## 2020-04-28 DIAGNOSIS — R252 Cramp and spasm: Secondary | ICD-10-CM | POA: Diagnosis not present

## 2020-04-28 DIAGNOSIS — G8929 Other chronic pain: Secondary | ICD-10-CM | POA: Diagnosis not present

## 2020-04-28 MED ORDER — GABAPENTIN 300 MG PO CAPS: 300.0000 mg | ORAL_CAPSULE | Freq: Two times a day (BID) | ORAL | 3 refills | Status: DC

## 2020-04-28 NOTE — Therapy (Signed)
[facility] [address] [place], [address] Phone: [phone]   Fax:  [phone]  Physical Therapy Treatment  Patient Details  Name: Vanessa Carlson MRN: [id number] Date of Birth: 25-Oct-1960 Referring Provider (PT): Brita Romp MD   Encounter Date: 04/28/2020   PT End of Session - 04/28/20 0948    Visit Number 9    Number of Visits 13    Date for PT Re-Evaluation 04/29/20    PT Start Time 0945    PT Stop Time 1030    PT Time Calculation (min) 45 min    Activity Tolerance Patient tolerated treatment well    Behavior During Therapy [facility] for tasks assessed/performed           Past Medical History:  Diagnosis Date  . AICD (automatic cardioverter/defibrillator) present   . Anterior myocardial infarction [facility])    a. 10/2014 - occluded LAD, complicated by cardiogenic shock-->Med Rx as interventional team was unable to open LAD.  [name] Cancer of upper lobe of right lung ([place]) 09/2017   radiation therapy right side  . Cervical cancer ([place])    in remission for ~20 yrs, s/p hysterectomy  . Chronic combined systolic and diastolic CHF (congestive heart failure) ([place])    a. 10/2015 Echo: EF 20-25%, sev diast dysfxn, mildly reduced RV fxn. Nl MV prosthesis; b. 12/2017 Echo: EF 20-25%, diff HK. Sev ant/antsept HK, apical AK. Mild MS (mean graad 38mmHg). Mod TR. PASP 69mmHg.  [name] Coronary artery disease    a. 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  . Essential hypertension   . Ischemic cardiomyopathy    a. 03/2015 s/p MDT single lead AICD;  b. 10/2015 Echo: EF 20-25%;  c. 12/2017 Echo: EF 20-25%, diff HK.  . Mass of upper lobe of right lung    a. noted on CXR & CT 08/2017 w/ abnl PET CT.  [name] Mitral valve disease    a. 2006 s/p MVR w/ 73mm SJM bileaflet mechanical valve-->chronic coumadin/ASA; b. 12/2017 Echo: Mild MS. Mean gradient 63mmHg.   [name] Personal history of radiation therapy    f/u lung ca    Past Surgical History:  Procedure Laterality Date  . APPENDECTOMY  1998  . BREAST EXCISIONAL BIOPSY Left 80s   benign  . CARDIAC CATHETERIZATION    . CARDIAC DEFIBRILLATOR PLACEMENT    . COLONOSCOPY WITH PROPOFOL N/A 01/11/2020   Procedure: COLONOSCOPY WITH PROPOFOL;  Surgeon: Jonathon Bellows, MD;  Location: [facility] ENDOSCOPY;  Service: Gastroenterology;  Laterality: N/A;  . RADICAL HYSTERECTOMY  1998  . SHOULDER SURGERY Bilateral    rotator cuff tears  . VALVE REPLACEMENT     Mitral valve; 27 mm St. Jude bileaflet valve    There were no vitals filed for this visit.   Subjective Assessment - 04/28/20 0944    Subjective Patient reports soreness after last session but no other issues at todays session.    Pertinent History Hx of CA, insidious onset of L sided neck pain 3 weeks prior, B rotator cuff repair  surgery (unknown level of severity), MI, R knee pain 6 months ago    Limitations Lifting    Diagnostic tests none    Patient Stated Goals To avoid knee replacement; Walk easier.    Currently in Pain? No/denies    Pain Onset More than a month ago  INTERVENTIONS     Therapeutic Exercise Total Gym B Squats: level 25 - x 20; level 26 x20  Leg Extension at [facility] 2x15 10lbs  Leg Curl at [facility] 2x15 35# Hip machine Abduction with B UE support - 40# 2 x 15 B Hip Machine Extension with B UE Support -- 40# 2 x 15 B  RDL x15 20# Lunges with UE Supports x12 B   Performed exercises to improve hip strength and stability of LE      PT Education - 04/28/20 0948    Education Details form/technique with exercise    Person(s) Educated Patient    Methods Explanation;Demonstration    Comprehension Verbalized understanding;Returned demonstration            PT Short Term Goals - 03/19/20 1035      PT SHORT TERM GOAL #1   Title Patient will be independent with HEP to continue benefits of therapy until after discharge.     Baseline Dependent for form/technique    Time 6    Period Weeks    Status New    Target Date 04/08/20             PT Long Term Goals - 03/19/20 1036      PT LONG TERM GOAL #1   Title Patient will have a worst pain of a 1/10 to indicate significant improvement in pain and spasms most notably while using stairs.    Baseline 8/10    Time 6    Period Weeks    Status New    Target Date 04/29/20      PT LONG TERM GOAL #2   Title Patient will improve her FOTO score to 65 to indicate improvement in functional mobility with less pain.    Baseline 03/18/20: 57    Time 6    Period Weeks    Status New    Target Date 04/29/20      PT LONG TERM GOAL #3   Title Patient will be able to ascend and descend stairs without an increase in pain to allow for comfortable home and community ambulation.    Baseline Pain ascending/descending staits    Time 6    Period Weeks    Status New    Target Date 04/29/20      PT LONG TERM GOAL #4   Title Patient will be able to perform > 1 hour of walking to return to recreational activities for fitness    Baseline 20 min before requiring a sitting rest break    Time 6    Period Weeks    Status New    Target Date 04/29/20                 Plan - 04/28/20 1024    Clinical Impression Statement Continued to work on improving patient's strength and stability of both her hips and knees during today's session. Patient tolerated the session well with having decreased pain at the end of the session. Patient's endurance is improving but she continues to have mild muscular fatigue.  Patient will continue to benefit from skilled therapy to progress towards long-term goals and return to prior level of function.    Personal Factors and Comorbidities Comorbidity 3+    Comorbidities MI, hx of CA, B RCR surgery    Examination-Activity Limitations Sit;Lift;Squat;Stairs;Locomotion Level    Examination-Participation Restrictions Community Activity;Other     Stability/Clinical Decision Making Stable/Uncomplicated    Clinical Decision Making Low    Rehab  Potential Good    PT Frequency 2x / week    PT Duration 6 weeks    PT Treatment/Interventions ADLs/Self Care Home Management;Biofeedback;Electrical Stimulation;Moist Heat;Cryotherapy;Gait training;Stair training;Functional mobility training;Therapeutic activities;Therapeutic exercise;Balance training;Neuromuscular re-education;Patient/family education;Manual techniques;Passive range of motion;Dry needling;Taping;Spinal Manipulations;Joint Manipulations    PT Next Visit Plan Address muscular endurance and continue with LE Strengthening    PT Home Exercise Plan STS, LAQ, Step-ups    Consulted and Agree with Plan of Care Patient           Patient will benefit from skilled therapeutic intervention in order to improve the following deficits and impairments:  Pain, Decreased mobility, Postural dysfunction, Decreased endurance, Decreased range of motion, Decreased strength, Decreased activity tolerance, Abnormal gait, Hypomobility, Improper body mechanics  Visit Diagnosis: Chronic pain of right knee  Stiffness of right knee, not elsewhere classified  Cramp and spasm     Problem List Patient Active Problem List   Diagnosis Date Noted  . 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 Neck muscle spasm 12/14/2019  . Myocardiopathy ([place]) 08/18/2018  . Valvular heart disease 08/18/2018  . Lung cancer ([place]) 05/31/2018  . Hyperlipidemia LDL goal <70 03/22/2018  . Morbid obesity ([place]) 12/22/2017  . Insomnia 12/07/2017  . OA (osteoarthritis) of knee 07/08/2017  . Chronic anticoagulation 07/08/2017  . Paroxysmal atrial fibrillation ([facility])   . Coronary artery disease 02/24/2017  . Ischemic cardiomyopathy 02/24/2017  . Rheumatic heart disease 02/24/2017  . Status post mitral valve replacement 02/24/2017  . Chronic systolic heart failure ([place]) 01/02/2017  . HTN (hypertension) 01/02/2017    10:30 AM, 04/28/20 Margarito Liner, SPT Student Physical Therapist [place]  [phone]  Margarito Liner 04/28/2020, 10:24 AM  [facility] [address], [place], [address] Phone: [phone]   Fax:  [phone]  Name: Vanessa Carlson MRN: [id number] Date of Birth: 1961-01-07

## 2020-05-01 ENCOUNTER — Ambulatory Visit: Payer: Medicare Other

## 2020-05-01 NOTE — Progress Notes (Signed)
Established patient visit   Patient: Vanessa Carlson   DOB: Sep 02, 1960   59 y.o. Female  MRN: [id number] Visit Date: 05/02/2020  Today's healthcare provider: Lavon Paganini, MD   I,Sulibeya S Dimas,acting as a scribe for Lavon Paganini, MD.,have documented all relevant documentation on the behalf of Lavon Paganini, MD,as directed by  Lavon Paganini, MD while in the presence of Lavon Paganini, MD.  Chief Complaint  Patient presents with  . Eye Problem   Subjective    Eye Problem  The right eye is affected. This is a recurrent problem. The current episode started 1 to 4 weeks ago. The problem occurs every several days. The problem has been unchanged. There was no injury mechanism. The pain is mild. There is no known exposure to pink eye. She does not wear contacts. Associated symptoms include eye redness and itching. Pertinent negatives include no blurred vision, eye discharge, double vision, fever or photophobia. She has tried eye drops for the symptoms. The treatment provided mild relief.    Patient Active Problem List   Diagnosis Date Noted  . 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 Neck muscle spasm 12/14/2019  . Myocardiopathy ([place]) 08/18/2018  . Valvular heart disease 08/18/2018  . Lung cancer ([place]) 05/31/2018  . Hyperlipidemia LDL goal <70 03/22/2018  . Morbid obesity ([place]) 12/22/2017  . Insomnia 12/07/2017  . OA (osteoarthritis) of knee 07/08/2017  . Chronic anticoagulation 07/08/2017  . Paroxysmal atrial fibrillation ([facility])   . Coronary artery disease 02/24/2017  . Ischemic cardiomyopathy 02/24/2017  . Rheumatic heart disease 02/24/2017  . Status post mitral valve replacement 02/24/2017  . Chronic systolic heart failure ([place]) 01/02/2017  . HTN (hypertension) 01/02/2017   Social History   Tobacco Use  . Smoking status: Former Smoker    Packs/day: 1.00    Years: 4.00    Pack years: 4.00    Types: Cigarettes    Quit date: 1998     Years since quitting: 23.6  . Smokeless tobacco: Never Used  Vaping Use  . Vaping Use: Never used  Substance Use Topics  . Alcohol use: No  . Drug use: No   No Known Allergies     Medications: Outpatient Medications Prior to Visit  Medication Sig  . aspirin 81 MG chewable tablet Chew 81 mg by mouth daily.  [name] atorvastatin (LIPITOR) 80 MG tablet Take 1 tablet by mouth once daily  . cyclobenzaprine (FLEXERIL) 5 MG tablet Take 1 tablet (5 mg total) by mouth 3 (three) times daily as needed for muscle spasms.  . fluticasone (FLONASE) 50 MCG/ACT nasal spray Place 2 sprays into both nostrils as needed for allergies or rhinitis.  . furosemide (LASIX) 40 MG tablet Take 2 tablets (80 mg total) by mouth 2 (two) times daily.  [name] gabapentin (NEURONTIN) 300 MG capsule Take 1 capsule (300 mg total) by mouth 2 (two) times daily.  . Lidocaine 5 % CREA Apply 1 application topically every 6 (six) hours as needed.  . metoprolol succinate (TOPROL-XL) 50 MG 24 hr tablet Take 1/2 tablet (25 mg) by mouth two times a day. Take with or immediately following a meal.  . nitroGLYCERIN (NITROSTAT) 0.3 MG SL tablet Place 1 tablet (0.3 mg total) under the tongue every 5 (five) minutes as needed for chest pain.  . sacubitril-valsartan (ENTRESTO) 24-26 MG Take 1 tablet by mouth 2 (two) times daily.  [name] spironolactone (ALDACTONE) 25 MG tablet Take 1 tablet by mouth once daily  . traMADol (  ULTRAM) 50 MG tablet TAKE 1 TABLET BY MOUTH EVERY 6 HOURS AS NEEDED  . traZODone (DESYREL) 50 MG tablet Take 0.5-1 tablets (25-50 mg total) by mouth at bedtime as needed for sleep.  [name] warfarin (COUMADIN) 10 MG tablet Take 1 tablet (10 mg total) by mouth as directed.  . warfarin (COUMADIN) 7.5 MG tablet Take 1 tablet (7.5 mg total) by mouth daily.  . diclofenac Sodium (VOLTAREN) 1 % GEL APPLY 2 GRAMS TOPICALLY 4 TIMES DAILY (Patient not taking: Reported on 05/02/2020)  . enoxaparin (LOVENOX) 100 MG/ML injection Inject 1 mL (100 mg total) into  the skin every 12 (twelve) hours for 5 days.   No facility-administered medications prior to visit.    Review of Systems  Constitutional: Negative for chills and fever.  HENT: Negative for ear pain and rhinorrhea.   Eyes: Positive for pain, redness and itching. Negative for blurred vision, double vision, photophobia, discharge and visual disturbance.  Respiratory: Negative for cough.   Cardiovascular: Negative for chest pain and palpitations.      Objective    BP 107/68 (BP Location: Left Arm, Patient Position: Sitting, Cuff Size: Large)   Pulse 79   Temp 98.2 F (36.8 C) (Oral)   Resp 16   Wt 232 lb 6.4 oz (105.4 kg)   LMP  (LMP Unknown)   BMI 42.51 kg/m  BP Readings from Last 3 Encounters:  05/02/20 107/68  01/11/20 103/78  01/08/20 110/68   Wt Readings from Last 3 Encounters:  05/02/20 232 lb 6.4 oz (105.4 kg)  01/11/20 225 lb (102.1 kg)  01/08/20 227 lb 2 oz (103 kg)      Physical Exam Vitals reviewed.  Constitutional:      General: She is not in acute distress.    Appearance: She is well-developed. She is obese.  HENT:     Head: Normocephalic and atraumatic.  Eyes:     General: No scleral icterus.       Right eye: No discharge.        Left eye: No discharge.     Extraocular Movements:     Right eye: Normal extraocular motion.     Left eye: Normal extraocular motion.     Conjunctiva/sclera: Conjunctivae normal.  Cardiovascular:     Rate and Rhythm: Normal rate and regular rhythm.  Pulmonary:     Effort: Pulmonary effort is normal. No respiratory distress.  Skin:    General: Skin is warm and dry.     Findings: No rash.  Neurological:     Mental Status: She is alert and oriented to person, place, and time.  Psychiatric:        Behavior: Behavior normal.      No results found for any visits on 05/02/20.  Assessment & Plan     1. Allergic conjunctivitis of right eye - sclera/conjunctiva are clear today - history is consistent with allergic  conjunctivitis with stringy film and itching - start Pat[place] eye drops daily - discussed return/optho precautions - red/painful eye, vision changes, pain with EOM, redness around the eye, etc  Return if symptoms worsen or fail to improve.      I, Lavon Paganini, MD, have reviewed all documentation for this visit. The documentation on 05/02/20 for the exam, diagnosis, procedures, and orders are all accurate and complete.   Evan Mackie, Dionne Bucy, MD, MPH [facility]

## 2020-05-02 ENCOUNTER — Other Ambulatory Visit: Payer: Self-pay

## 2020-05-02 ENCOUNTER — Ambulatory Visit (INDEPENDENT_AMBULATORY_CARE_PROVIDER_SITE_OTHER): Payer: Medicare Other | Admitting: Family Medicine

## 2020-05-02 ENCOUNTER — Encounter: Payer: Self-pay | Admitting: Family Medicine

## 2020-05-02 VITALS — BP 107/68 | HR 79 | Temp 98.2°F | Resp 16 | Wt 232.4 lb

## 2020-05-02 DIAGNOSIS — H1011 Acute atopic conjunctivitis, right eye: Secondary | ICD-10-CM

## 2020-05-02 MED ORDER — OLOPATADINE HCL 0.2 % OP SOLN: 1.0000 [drp] | Freq: Every day | OPHTHALMIC | 0 refills | Status: DC

## 2020-05-02 NOTE — Patient Instructions (Signed)
Allergic Conjunctivitis A clear membrane (conjunctiva) covers the white part of your eye and the inner surface of your eyelid. Allergic conjunctivitis happens when this membrane has inflammation. This is caused by allergies. Common causes of allergic reactions (allergens) include:  Outdoor allergens, such as: ? Pollen. ? Grass and weeds. ? Mold spores.  Indoor allergens, such as: ? Dust. ? Smoke. ? Mold. ? Pet dander. ? Animal hair. This condition can make your eye red or pink. It can also make your eye feel itchy. This condition cannot be spread from one person to another person (is not contagious). Follow these instructions at home:  Try not to be around things that you are allergic to.  Take or apply over-the-counter and prescription medicines only as told by your doctor. These include any eye drops.  Place a cool, clean washcloth on your eye for 10-20 minutes. Do this 3-4 times a day.  Do not touch or rub your eyes.  Do not wear contact lenses until the inflammation is gone. Wear glasses instead.  Do not wear eye makeup until the inflammation is gone.  Keep all follow-up visits as told by your doctor. This is important. Contact a doctor if:  Your symptoms get worse.  Your symptoms do not get better with treatment.  You have mild eye pain.  You are sensitive to light,  You have spots or blisters on your eyes.  You have pus coming from your eye.  You have a fever. Get help right away if:  You have redness, swelling, or other symptoms in only one eye.  Your vision is blurry.  You have vision changes.  You have very bad eye pain. Summary  Allergic conjunctivitis is caused by allergies. It can make your eye red or pink, and it can make your eye feel itchy.  This condition cannot be spread from one person to another person (is not contagious).  Try not to be around things that you are allergic to.  Take or apply over-the-counter and prescription medicines  only as told by your doctor. These include any eye drops.  Contact your doctor if your symptoms get worse or they do not get better with treatment. This information is not intended to replace advice given to you by your health care provider. Make sure you discuss any questions you have with your health care provider. Document Revised: 12/05/2018 Document Reviewed: 04/09/2016 Elsevier Patient Education  [place].

## 2020-05-06 ENCOUNTER — Ambulatory Visit: Payer: Medicare Other | Attending: Family Medicine

## 2020-05-06 ENCOUNTER — Other Ambulatory Visit: Payer: Self-pay | Admitting: Family Medicine

## 2020-05-06 ENCOUNTER — Ambulatory Visit (INDEPENDENT_AMBULATORY_CARE_PROVIDER_SITE_OTHER): Payer: Medicare Other | Admitting: Family Medicine

## 2020-05-06 ENCOUNTER — Other Ambulatory Visit: Payer: Self-pay

## 2020-05-06 DIAGNOSIS — G8929 Other chronic pain: Secondary | ICD-10-CM | POA: Diagnosis not present

## 2020-05-06 DIAGNOSIS — Z952 Presence of prosthetic heart valve: Secondary | ICD-10-CM | POA: Diagnosis not present

## 2020-05-06 DIAGNOSIS — M25561 Pain in right knee: Secondary | ICD-10-CM | POA: Diagnosis not present

## 2020-05-06 DIAGNOSIS — R252 Cramp and spasm: Secondary | ICD-10-CM | POA: Diagnosis not present

## 2020-05-06 DIAGNOSIS — M25661 Stiffness of right knee, not elsewhere classified: Secondary | ICD-10-CM

## 2020-05-06 LAB — POCT INR
INR: 5.1 — AB (ref 2.0–3.0)
PT: 61.6

## 2020-05-06 NOTE — Telephone Encounter (Signed)
Requested Prescriptions  Pending Prescriptions Disp Refills  . 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

## 2020-05-06 NOTE — Therapy (Signed)
[facility] [facility] [address], [place], [address] Phone: [phone]   Fax:  [phone]  Physical Therapy Treatment/ Discharge summary  Patient Details  Name: Vanessa Carlson MRN: [id number] Date of Birth: 19-Jan-1961 Referring Provider (PT): Bacigalupo MD  Reporting Period: 03/19/2020 -05/06/2020  Encounter Date: 05/06/2020   PT End of Session - 05/06/20 1303    Visit Number 10    Number of Visits 13    Date for PT Re-Evaluation 04/29/20    PT Start Time 1300    PT Stop Time 1345    PT Time Calculation (min) 45 min    Activity Tolerance Patient tolerated treatment well    Behavior During Therapy [facility] for tasks assessed/performed           Past Medical History:  Diagnosis Date  . AICD (automatic cardioverter/defibrillator) present   . Anterior myocardial infarction [facility])    a. 10/2014 - occluded LAD, complicated by cardiogenic shock-->Med Rx as interventional team was unable to open LAD.  [name] Cancer of upper lobe of right lung ([place]) 09/2017   radiation therapy right side  . Cervical cancer ([place])    in remission for ~20 yrs, s/p hysterectomy  . Chronic combined systolic and diastolic CHF (congestive heart failure) ([place])    a. 10/2015 Echo: EF 20-25%, sev diast dysfxn, mildly reduced RV fxn. Nl MV prosthesis; b. 12/2017 Echo: EF 20-25%, diff HK. Sev ant/antsept HK, apical AK. Mild MS (mean graad 40mHg). Mod TR. PASP 425mg.  . Marland Kitchenoronary artery disease    a. 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  . Essential hypertension   . Ischemic cardiomyopathy    a. 03/2015 s/p MDT single lead AICD;  b. 10/2015 Echo: EF 20-25%;  c. 12/2017 Echo: EF 20-25%, diff HK.  . Mass of upper lobe of right lung    a. noted on CXR & CT 08/2017 w/ abnl PET CT.  . Marland Kitchenitral valve disease    a. 2006 s/p MVR w/ 2711mJM bileaflet mechanical valve-->chronic  coumadin/ASA; b. 12/2017 Echo: Mild MS. Mean gradient 4mm38m  . PeMarland Kitchensonal history of radiation therapy    f/u lung ca    Past Surgical History:  Procedure Laterality Date  . APPENDECTOMY  1998  . BREAST EXCISIONAL BIOPSY Left 80s   benign  . CARDIAC CATHETERIZATION    . CARDIAC DEFIBRILLATOR PLACEMENT    . COLONOSCOPY WITH PROPOFOL N/A 01/11/2020   Procedure: COLONOSCOPY WITH PROPOFOL;  Surgeon: AnnaJonathon Bellows;  [facility];  Service: Gastroenterology;  Laterality: N/A;  . RADICAL HYSTERECTOMY  1998  . SHOULDER SURGERY Bilateral    rotator cuff tears  . VALVE REPLACEMENT     Mitral valve; 27 mm St. Jude bileaflet valve    There were no vitals filed for this visit.   Subjective Assessment - 05/06/20 1302    Subjective Patient reports her knee has been doing well and has no issues to report.    Pertinent History Hx of CA, insidious onset of L sided neck pain 3 weeks prior, B rotator cuff repair  surgery (unknown level of severity), MI, R knee pain 6 months ago    Limitations Lifting    Diagnostic tests none    Patient Stated Goals To avoid knee replacement; Walk easier.    Currently in Pain? No/denies    Pain Onset More than a month ago  INTERVENTIONS     Therapeutic Exercise Total Gym B Squats: level 25 - x 20; level 26 2x20  Leg Extension at [facility] 3x15 15lbs  Hip machine Abduction with B UE support - 40# 2 x 15 B Hip Machine Extension with B UE Support -- 40# 2 x 15 B    Performed exercises to improve hip strength and stability of LE        PT Education - 05/06/20 1303    Education Details form/technique with exercise    Person(s) Educated Patient    Methods Explanation;Demonstration    Comprehension Verbalized understanding;Returned demonstration            PT Short Term Goals - 05/06/20 1305      PT SHORT TERM GOAL #1   Title Patient will be independent with HEP to continue benefits of therapy until after discharge.    Baseline  Dependent for form/technique    Time 6    Period Weeks    Status Achieved    Target Date 04/08/20             PT Long Term Goals - 05/06/20 1305      PT LONG TERM GOAL #1   Title Patient will have a worst pain of a 1/10 to indicate significant improvement in pain and spasms most notably while using stairs.    Baseline 8/10; 05/06/20: 1/10    Time 6    Period Weeks    Status Achieved      PT LONG TERM GOAL #2   Title Patient will improve her FOTO score to 65 to indicate improvement in functional mobility with less pain.    Baseline 03/18/20: 57; 05/06/20: 71    Time 6    Period Weeks    Status Achieved      PT LONG TERM GOAL #3   Title Patient will be able to ascend and descend stairs without an increase in pain to allow for comfortable home and community ambulation.    Baseline Pain ascending/descending stairs; 05/06/20: stairs without pain or symptoms arising    Time 6    Period Weeks    Status Achieved      PT LONG TERM GOAL #4   Title Patient will be able to perform > 1 hour of walking to return to recreational activities for fitness    Baseline 20 min before requiring a sitting rest break; 05/06/20: 35 minutes    Time 6    Period Weeks    Status On-going                 Plan - 05/06/20 1304    Clinical Impression Statement Patient requests to be discharged this session due to improvements she has made during therapy.  Patient has met most long-term goals of decreasing worst pain to 1/10NPS, increasing FOTO score to 71 showing improvement in ability to perform functional activities, traversing stairs without symptoms arising, however, can only walk for around 35 minutes which is shy of her hour goal. Patient was instructed through and given a progressive HEP to continue improving and to maintain the progress that was made during her POC.    Personal Factors and Comorbidities Comorbidity 3+    Comorbidities MI, hx of CA, B RCR surgery    Examination-Activity  Limitations Sit;Lift;Squat;Stairs;Locomotion Level    Examination-Participation Restrictions Community Activity;Other    Stability/Clinical Decision Making Stable/Uncomplicated    Clinical Decision Making Low    Rehab Potential Good  PT Frequency 2x / week    PT Duration 6 weeks    PT Treatment/Interventions ADLs/Self Care Home Management;Biofeedback;Electrical Stimulation;Moist Heat;Cryotherapy;Gait training;Stair training;Functional mobility training;Therapeutic activities;Therapeutic exercise;Balance training;Neuromuscular re-education;Patient/family education;Manual techniques;Passive range of motion;Dry needling;Taping;Spinal Manipulations;Joint Manipulations    PT Next Visit Plan Address muscular endurance and continue with LE Strengthening    PT Home Exercise Plan STS, LAQ, Step-ups    Consulted and Agree with Plan of Care Patient           Patient will benefit from skilled therapeutic intervention in order to improve the following deficits and impairments:  Pain, Decreased mobility, Postural dysfunction, Decreased endurance, Decreased range of motion, Decreased strength, Decreased activity tolerance, Abnormal gait, Hypomobility, Improper body mechanics  Visit Diagnosis: Chronic pain of right knee  Stiffness of right knee, not elsewhere classified  Cramp and spasm     Problem List Patient Active Problem List   Diagnosis Date Noted  . 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 Neck muscle spasm 12/14/2019  . Myocardiopathy ([place]) 08/18/2018  . Valvular heart disease 08/18/2018  . Lung cancer ([place]) 05/31/2018  . Hyperlipidemia LDL goal <70 03/22/2018  . Morbid obesity ([place]) 12/22/2017  . Insomnia 12/07/2017  . OA (osteoarthritis) of knee 07/08/2017  . Chronic anticoagulation 07/08/2017  . Paroxysmal atrial fibrillation ([facility])   . Coronary artery disease 02/24/2017  . Ischemic cardiomyopathy 02/24/2017  . Rheumatic heart disease 02/24/2017  . Status post  mitral valve replacement 02/24/2017  . Chronic systolic heart failure ([place]) 01/02/2017  . HTN (hypertension) 01/02/2017   1:50 PM, 05/06/20 Margarito Liner, SPT Student Physical Therapist [place]  [phone]  Margarito Liner 05/06/2020, 1:48 PM  [place] [address] [place], [address] Phone: [phone]   Fax:  [phone]  Name: TERESHA HANKS MRN: [id number] Date of Birth: June 13, 1961

## 2020-05-12 ENCOUNTER — Encounter: Payer: Self-pay | Admitting: Family Medicine

## 2020-05-12 ENCOUNTER — Ambulatory Visit (INDEPENDENT_AMBULATORY_CARE_PROVIDER_SITE_OTHER): Payer: Medicare Other

## 2020-05-12 DIAGNOSIS — Z95810 Presence of automatic (implantable) cardiac defibrillator: Secondary | ICD-10-CM

## 2020-05-12 DIAGNOSIS — I5022 Chronic systolic (congestive) heart failure: Secondary | ICD-10-CM | POA: Diagnosis not present

## 2020-05-12 NOTE — Telephone Encounter (Signed)
LMTCB 05/12/2020   Thanks,   -Mickel Baas

## 2020-05-13 NOTE — Progress Notes (Signed)
EPIC Encounter for ICM Monitoring  Patient Name: Vanessa Carlson is a 59 y.o. female Date: 05/13/2020 Primary Care Physican: Virginia Crews, MD Primary Cardiologist: Carlis Abbott, NP HF Electrophysiologist: Caryl Comes 05/02/2020 OfficeWeight:232lbs   Transmission reviewed.   Optivol thoracic impedancenormal.  Prescribed:Furosemide40 mg2tablets (80 mg total) twice a day.   Labs: 12/19/2019 Creatinine 0.97, BUN 14, Potassium 4.8, Sodium 145, GFR 64-74 09/17/2019 Creatinine 0.89, BUN 13, Potassium 3.6, Sodium 140, GFR >60 A complete set of results can be found in Results Review.  Recommendations:No changes  Follow-up plan: ICM clinic phone appointment on10/18/2021. 91 day device clinic remote transmission10/26/2021.     EP/Cardiology Office Visits: 06/25/2021 with Dr. Caryl Comes.    Copy of ICM check sent to Dr. Caryl Comes.    3 month ICM trend: 05/12/2020    1 Year ICM trend:       [name], RN 05/13/2020 2:51 PM

## 2020-05-19 ENCOUNTER — Encounter: Payer: Self-pay | Admitting: Family Medicine

## 2020-05-21 ENCOUNTER — Ambulatory Visit: Payer: Medicare Other

## 2020-05-22 ENCOUNTER — Ambulatory Visit: Payer: Medicare Other

## 2020-05-28 ENCOUNTER — Ambulatory Visit (INDEPENDENT_AMBULATORY_CARE_PROVIDER_SITE_OTHER): Payer: Medicare Other

## 2020-05-28 ENCOUNTER — Other Ambulatory Visit: Payer: Self-pay

## 2020-05-28 DIAGNOSIS — I480 Paroxysmal atrial fibrillation: Secondary | ICD-10-CM | POA: Diagnosis not present

## 2020-05-28 DIAGNOSIS — Z952 Presence of prosthetic heart valve: Secondary | ICD-10-CM | POA: Diagnosis not present

## 2020-05-28 LAB — POCT INR
INR: 3.3 — AB (ref 2.0–3.0)
Prothrombin Time: 39.3

## 2020-05-28 NOTE — Patient Instructions (Signed)
Description    Continue current dose 7.5mg  daily.  No change, return back in 4 weeks.

## 2020-06-05 ENCOUNTER — Ambulatory Visit (INDEPENDENT_AMBULATORY_CARE_PROVIDER_SITE_OTHER): Payer: Medicare Other

## 2020-06-05 ENCOUNTER — Other Ambulatory Visit: Payer: Self-pay

## 2020-06-05 DIAGNOSIS — Z23 Encounter for immunization: Secondary | ICD-10-CM

## 2020-06-13 ENCOUNTER — Other Ambulatory Visit: Payer: Self-pay | Admitting: Family Medicine

## 2020-06-14 ENCOUNTER — Other Ambulatory Visit: Payer: Self-pay | Admitting: Family Medicine

## 2020-06-14 ENCOUNTER — Other Ambulatory Visit: Payer: Self-pay | Admitting: Internal Medicine

## 2020-06-14 NOTE — Telephone Encounter (Signed)
Requested Prescriptions  Pending Prescriptions Disp Refills   spironolactone (ALDACTONE) 25 MG tablet [Pharmacy Med Name: Spironolactone 25 MG Oral Tablet] 90 tablet 0    Sig: Take 1 tablet by mouth once daily     Cardiovascular: Diuretics - Aldosterone Antagonist Passed - 06/14/2020 12:29 PM      Passed - Cr in normal range and within 360 days    Creat  Date Value Ref Range Status  07/08/2017 1.14 (H) 0.50 - 1.05 mg/dL Final    Comment:    For patients >75 years of age, the reference limit for Creatinine is approximately 13% higher for people identified as African-American. .    Creatinine, Ser  Date Value Ref Range Status  03/19/2020 0.94 0.44 - 1.00 mg/dL Final         Passed - K in normal range and within 360 days    Potassium  Date Value Ref Range Status  03/19/2020 4.4 3.5 - 5.1 mmol/L Final         Passed - Na in normal range and within 360 days    Sodium  Date Value Ref Range Status  03/19/2020 141 135 - 145 mmol/L Final  12/19/2019 145 (H) 134 - 144 mmol/L Final         Passed - Last BP in normal range    BP Readings from Last 1 Encounters:  05/02/20 107/68         Passed - Valid encounter within last 6 months    Recent Outpatient Visits          1 month ago Allergic conjunctivitis of right eye   [facility] [place], Dionne Bucy, MD   6 months ago Encounter for annual wellness visit (AWV) in Medicare patient   [place], Dionne Bucy, MD   6 months ago Muscle spasm   [place], Clearnce Sorrel, [place]   1 year ago 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

## 2020-06-16 ENCOUNTER — Ambulatory Visit: Payer: Medicare Other | Admitting: Family Medicine

## 2020-06-16 ENCOUNTER — Other Ambulatory Visit: Payer: Self-pay

## 2020-06-20 NOTE — Progress Notes (Signed)
No ICM remote transmission received for 06/16/2020 and next ICM transmission scheduled for 07/07/2020.

## 2020-06-25 ENCOUNTER — Other Ambulatory Visit: Payer: Self-pay

## 2020-06-25 ENCOUNTER — Ambulatory Visit: Payer: Medicare Other | Admitting: Internal Medicine

## 2020-06-25 ENCOUNTER — Ambulatory Visit (INDEPENDENT_AMBULATORY_CARE_PROVIDER_SITE_OTHER): Payer: Medicare Other

## 2020-06-25 DIAGNOSIS — I480 Paroxysmal atrial fibrillation: Secondary | ICD-10-CM | POA: Diagnosis not present

## 2020-06-25 DIAGNOSIS — Z952 Presence of prosthetic heart valve: Secondary | ICD-10-CM | POA: Diagnosis not present

## 2020-06-25 LAB — POCT INR
INR: 3.4 — AB (ref 2.0–3.0)
PT: 41.3

## 2020-06-25 NOTE — Patient Instructions (Signed)
Description    Continue current dose 7.5mg  daily.  No change, return back in 4 weeks.

## 2020-06-26 ENCOUNTER — Ambulatory Visit: Payer: Medicare Other

## 2020-07-07 ENCOUNTER — Ambulatory Visit (INDEPENDENT_AMBULATORY_CARE_PROVIDER_SITE_OTHER): Payer: Medicare Other

## 2020-07-07 DIAGNOSIS — I5022 Chronic systolic (congestive) heart failure: Secondary | ICD-10-CM

## 2020-07-07 DIAGNOSIS — Z95810 Presence of automatic (implantable) cardiac defibrillator: Secondary | ICD-10-CM | POA: Diagnosis not present

## 2020-07-09 NOTE — Progress Notes (Signed)
EPIC Encounter for ICM Monitoring  Patient Name: Vanessa Carlson is a 59 y.o. female Date: 07/09/2020 Primary Care Physican: Virginia Crews, MD Primary Cardiologist: Carlis Abbott, NP HF Electrophysiologist: Caryl Comes 05/02/2020 OfficeWeight:232lbs   Transmission reviewed.  Optivol thoracic impedancenormal.  Prescribed:Furosemide40 mg2tablets (80 mg total) twice a day.   Labs: 03/19/2020 Creatinine 0.94, BUN 17, Potassium 4.4, Sodium 141, GFR >60 12/19/2019 Creatinine 0.97, BUN 14, Potassium 4.8, Sodium 145, GFR 64-74 09/17/2019 Creatinine 0.89, BUN 13, Potassium 3.6, Sodium 140, GFR >60 A complete set of results can be found in Results Review.  Recommendations:No changes  Follow-up plan: ICM clinic phone appointment on12/14/2021. 91 day device clinic remote transmission1/25/2022.   EP/Cardiology Office Visits:5/22/2022with Dr. Caryl Comes.   Copy of ICM check sent to [place].  3 month ICM trend: 07/07/2020    1 Year ICM trend:       [name], RN 07/09/2020 9:05 AM

## 2020-07-16 ENCOUNTER — Other Ambulatory Visit: Payer: Self-pay | Admitting: Family Medicine

## 2020-07-16 DIAGNOSIS — M545 Low back pain, unspecified: Secondary | ICD-10-CM

## 2020-07-16 NOTE — Telephone Encounter (Signed)
Requested medication (s) are due for refill today yes  Requested medication (s) are on the active medication list yes  Future visit scheduled no  Last refill: 06/13/20  Notes to clinic: Request non delegated rx  Requested Prescriptions  Pending Prescriptions Disp Refills   traMADol (ULTRAM) 50 MG tablet [Pharmacy Med Name: traMADol HCl 50 MG Oral Tablet] 30 tablet 0    Sig: TAKE 1 TABLET BY MOUTH EVERY 6 HOURS AS NEEDED      Not Delegated - Analgesics:  Opioid Agonists Failed - 07/16/2020  8:37 AM      Failed - This refill cannot be delegated      Failed - Urine Drug Screen completed in last 360 days      Passed - Valid encounter within last 6 months    Recent Outpatient Visits           2 months ago Allergic conjunctivitis of right eye   [facility] [place], Dionne Bucy, MD   7 months ago Encounter for annual wellness visit (AWV) in Medicare patient   [place], Dionne Bucy, MD   7 months ago Muscle spasm   [facility] [place], Clearnce Sorrel, [place]   1 year ago 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[facility])   [place], [place], [place]   1 year ago Constipation, unspecified constipation type   [facility] [place], Wendee Beavers, [place]       Future Appointments             In 3 weeks End, [place] Gave, MD [facility]                Requested Prescriptions  Pending Prescriptions Disp Refills   traMADol (ULTRAM) 50 MG tablet [Pharmacy Med Name: traMADol HCl 50 MG Oral Tablet] 30 tablet 0    Sig: TAKE 1 TABLET BY MOUTH EVERY 6 HOURS AS NEEDED      Not Delegated - Analgesics:  Opioid Agonists Failed - 07/16/2020  8:37 AM      Failed - This refill cannot be delegated      Failed - Urine Drug Screen completed in last 360 days      Passed - Valid encounter within last 6 months    Recent Outpatient Visits           2 months ago Allergic conjunctivitis of  right eye   [facility] [place], Dionne Bucy, MD   7 months ago Encounter for annual wellness visit (AWV) in Medicare patient   [facility] [place], Dionne Bucy, MD   7 months ago Muscle spasm   [facility] [place], Clearnce Sorrel, [place]   1 year ago 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

## 2020-07-23 ENCOUNTER — Ambulatory Visit (INDEPENDENT_AMBULATORY_CARE_PROVIDER_SITE_OTHER): Payer: Medicare Other

## 2020-07-23 ENCOUNTER — Other Ambulatory Visit: Payer: Self-pay

## 2020-07-23 DIAGNOSIS — Z952 Presence of prosthetic heart valve: Secondary | ICD-10-CM

## 2020-07-23 DIAGNOSIS — I480 Paroxysmal atrial fibrillation: Secondary | ICD-10-CM | POA: Diagnosis not present

## 2020-07-23 LAB — POCT INR
INR: 3.5 — AB (ref 2.0–3.0)
PT: 41.9

## 2020-07-23 NOTE — Patient Instructions (Signed)
Description    Continue current dose 7.5mg  daily.  No change, return back in 4 weeks.

## 2020-07-26 ENCOUNTER — Other Ambulatory Visit: Payer: Self-pay | Admitting: Internal Medicine

## 2020-07-28 NOTE — Telephone Encounter (Signed)
Rx request sent to pharmacy.  

## 2020-08-06 ENCOUNTER — Other Ambulatory Visit: Payer: Self-pay

## 2020-08-06 ENCOUNTER — Ambulatory Visit: Payer: Medicare Other | Admitting: Internal Medicine

## 2020-08-06 ENCOUNTER — Other Ambulatory Visit
Admission: RE | Admit: 2020-08-06 | Discharge: 2020-08-06 | Disposition: A | Discharge status: Home / Self Care | Payer: Medicare Other | Source: Ambulatory Visit | Attending: Internal Medicine | Admitting: Internal Medicine

## 2020-08-06 ENCOUNTER — Encounter: Payer: Self-pay | Admitting: Internal Medicine

## 2020-08-06 VITALS — BP 126/74 | HR 74 | Ht 62.0 in | Wt 230.0 lb

## 2020-08-06 DIAGNOSIS — Z952 Presence of prosthetic heart valve: Secondary | ICD-10-CM | POA: Diagnosis not present

## 2020-08-06 DIAGNOSIS — I10 Essential (primary) hypertension: Secondary | ICD-10-CM | POA: Diagnosis not present

## 2020-08-06 DIAGNOSIS — I5022 Chronic systolic (congestive) heart failure: Secondary | ICD-10-CM | POA: Insufficient documentation

## 2020-08-06 DIAGNOSIS — I2510 Atherosclerotic heart disease of native coronary artery without angina pectoris: Secondary | ICD-10-CM

## 2020-08-06 LAB — BASIC METABOLIC PANEL
Anion gap: 8 (ref 5–15)
BUN: 10 mg/dL (ref 6–20)
CO2: 29 mmol/L (ref 22–32)
Calcium: 9.2 mg/dL (ref 8.9–10.3)
Chloride: 101 mmol/L (ref 98–111)
Creatinine, Ser: 0.92 mg/dL (ref 0.44–1.00)
GFR, Estimated: >60 mL/min (ref >60)
Glucose, Bld: 108 mg/dL — ABNORMAL HIGH (ref 70–99)
Potassium: 3.8 mmol/L (ref 3.5–5.1)
Sodium: 138 mmol/L (ref 135–145)

## 2020-08-06 NOTE — Progress Notes (Signed)
Follow-up Outpatient Visit Date: 08/06/2020  Primary Care Provider: Virginia Crews, MD [address] [place] [address]  Chief Complaint: Follow-up CAD, valvular heart disease, and chronic HFrEF  HPI:  Ms. Stevick is a 59 y.o. 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  I last saw her in April, at which time she reported stable exertional dyspnea and occasional dependent leg edema.  She was felt to be doing well when seen in the EP clinic by Dr. Caryl Comes in May.  Today,  Ms. Fortune reports feeling about the same as at our prior visits.  She has stable exertional dyspnea and dependent edema.  She describes occasional flutters in her chest lasting 10 seconds or less.  They are associated with a vague discomfort, though she denies anginal chest pain reminiscent of what she felt at the time of her MI.  She does not have any exertional chest pain.  Home blood pressures are typically 100-110/70-80.  She notes occasional orthostatic lightheadedness but has not fallen or passed out.  She remains compliant with her medications, including aspirin and warfarin.  She has not had any bleeding.  --------------------------------------------------------------------------------------------------  Cardiovascular History & Procedures: Cardiovascular Problems:  Coronary artery disease status post anterior STEMI (2016)  Ischemic cardiomyopathy with chronic systolic heart failure  Rheumatic heart disease status post mechanical mitral valve replacement (2006)  Risk  Factors:  Known coronary artery disease and hypertension  Cath/PCI:  LHC (2016, [place]): Reportedly proximal occlusion of the LAD, which could not be crossed with a wire.  CV Surgery:  Mitral valve replacement (2006, [facility]): 27 mm St. Jude bileaflet valve  EP Procedures and Devices:  ICD (04/01/15, [facility]): Medtronic single-chamber ICD  Non-Invasive Evaluation(s):  TTE (01/05/18): Mildly dilated LV. LVEF 20 to 25% with diffuse hypokinesis and severe hypokinesis of the anterior and anteroseptal myocardium. Apical akinesis. Mechanical mitral valve with mean gradient of 4 mmHg. Mild left atrial enlargement. Normal RV function. Moderate TR. PASP 43 mmHg.  TTE (11/20/15): Normal LV size and wall thickness. LVEF 20-25% with severe diastolic dysfunction. Mildly reduced RV contraction. Normal MV prosthesis function.  Recent CV Pertinent Labs: Lab Results  Component Value Date   CHOL 126 12/14/2019   HDL 48 12/14/2019   LDLCALC 61 12/14/2019   LDLCALC 66 07/08/2017   TRIG 87 12/14/2019   CHOLHDL 2.9 08/18/2018   CHOLHDL 2.8 07/08/2017   INR 3.5 (A) 07/23/2020   INR 1.10 09/23/2017   K 3.8 08/06/2020   BUN 10 08/06/2020   BUN 14 12/19/2019   CREATININE 0.92 08/06/2020   CREATININE 1.14 (H) 07/08/2017    Past medical and surgical history were reviewed and updated in EPIC.  Current Meds  Medication Sig  . aspirin 81 MG chewable tablet Chew 81 mg by mouth daily.  [name] atorvastatin (LIPITOR) 80 MG tablet Take 1 tablet by mouth once daily  . cyclobenzaprine (FLEXERIL) 5 MG tablet Take 1 tablet (5 mg total) by mouth 3 (three) times daily as needed for muscle spasms.  . diclofenac Sodium (VOLTAREN) 1 % GEL APPLY 2 GRAMS TOPICALLY 4 TIMES DAILY  . fluticasone (FLONASE) 50 MCG/ACT nasal spray Place 2 sprays into  both nostrils as needed for allergies or rhinitis.  . furosemide (LASIX) 40 MG tablet Take 2 tablets by mouth twice daily  . gabapentin (NEURONTIN) 300 MG capsule Take 1  capsule (300 mg total) by mouth 2 (two) times daily.  . metoprolol succinate (TOPROL-XL) 50 MG 24 hr tablet Take 0.5 tablets (25 mg total) by mouth in the morning and at bedtime. TAKE  WITH  OR  IMMEDIATELY  FOLLOWING  A  MEAL. Please keep your upcoming appointment for refills.  . nitroGLYCERIN (NITROSTAT) 0.3 MG SL tablet Place 1 tablet (0.3 mg total) under the tongue every 5 (five) minutes as needed for chest pain.  . sacubitril-valsartan (ENTRESTO) 24-26 MG Take 1 tablet by mouth 2 (two) times daily.  [name] spironolactone (ALDACTONE) 25 MG tablet Take 1 tablet by mouth once daily  . traMADol (ULTRAM) 50 MG tablet TAKE 1 TABLET BY MOUTH EVERY 6 HOURS AS NEEDED  . traZODone (DESYREL) 50 MG tablet Take 0.5-1 tablets (25-50 mg total) by mouth at bedtime as needed for sleep.  [name] warfarin (COUMADIN) 10 MG tablet Take 1 tablet (10 mg total) by mouth as directed.  . warfarin (COUMADIN) 7.5 MG tablet Take 1 tablet (7.5 mg total) by mouth daily.    Allergies: Patient has no known allergies.  Social History   Tobacco Use  . Smoking status: Former Smoker    Packs/day: 1.00    Years: 4.00    Pack years: 4.00    Types: Cigarettes    Quit date: 1998    Years since quitting: 23.9  . Smokeless tobacco: Never Used  Vaping Use  . Vaping Use: Never used  Substance Use Topics  . Alcohol use: No  . Drug use: No    Family History  Problem Relation Age of Onset  . Heart attack Mother   . Hypertension Mother   . Diabetes Mother   . Emphysema Father        smoker  . Diabetes Maternal Grandmother   . Kidney disease Maternal Grandmother   . Breast cancer Maternal Aunt   . Breast cancer Maternal Aunt   . Colon cancer Maternal Uncle     Review of Systems: A 12-system review of systems was performed and was negative except as noted in the HPI.  --------------------------------------------------------------------------------------------------  Physical Exam: BP 126/74 (BP Location: Left Arm, Patient  Position: Sitting, Cuff Size: Normal)   Pulse 74   Ht 5\' 2"  (1.575 m)   Wt 230 lb (104.3 kg)   LMP  (LMP Unknown)   SpO2 93%   BMI 42.07 kg/m   General: NAD. Neck: No JVD or HJR. Lungs: Clear to auscultation bilaterally without wheeze or crackles. Heart: Regular rate and rhythm with mechanical S1 and S2.  1/6 systolic murmur noted.  No rubs or gallops. Abdomen: Soft, nontender, nondistended. Extremities: No lower extremity edema.  EKG: Normal sinus rhythm with lateral infarct and left atrial enlargement.  No significant change since 01/08/2020.  Lab Results  Component Value Date   WBC 5.6 03/19/2020   HGB 12.5 03/19/2020   HCT 38.3 03/19/2020   MCV 87.6 03/19/2020   PLT 168 03/19/2020    Lab Results  Component Value Date   NA 138 08/06/2020   K 3.8 08/06/2020   CL 101 08/06/2020   CO2 29 08/06/2020   BUN 10 08/06/2020   CREATININE 0.92 08/06/2020   GLUCOSE 108 (H) 08/06/2020   ALT 24 03/19/2020    Lab Results  Component Value Date  CHOL 126 12/14/2019   HDL 48 12/14/2019   LDLCALC 61 12/14/2019   TRIG 87 12/14/2019   CHOLHDL 2.9 08/18/2018    --------------------------------------------------------------------------------------------------  ASSESSMENT AND PLAN: Chronic systolic heart failure: Ms. Quesinberry reports stable NYHA class II symptoms and appears euvolemic on examination today.  Device interrogations have shown normal fluid parameters.  We will continue her current regimen of metoprolol, Entresto, and spironolactone.  BMP to be checked today to ensure stable renal function and potassium in the setting of Entresto and spironolactone use.  Ongoing device monitoring per EP clinic.  Coronary artery disease: Patient reports occasional vague chest discomfort in the setting of transient palpitations.  She has not had any angina.  Continue aspirin and atorvastatin for secondary prevention without additional work-up unless symptoms worsen.  Status post mitral  valve replacement: No symptoms or exam findings to suggest valve dysfunction.  Continue anticoagulation with warfarin as well as aspirin use.  Normal valve function noted on most recent echo in 12/2017.  Morbid obesity: BMI remains greater than 40.  Weight loss encouraged through diet and exercise.  Follow-up: Return to clinic in 6 months.  [name], MD 08/08/2020 1:05 PM

## 2020-08-06 NOTE — Patient Instructions (Signed)
Medication Instructions:  Your physician recommends that you continue on your current medications as directed. Please refer to the Current Medication list given to you today.  *If you need a refill on your cardiac medications before your next appointment, please call your pharmacy*  Lab Work: Your physician recommends that you return for lab work in: [place] as you leave at the [facility].  - Please go to the [facility]. You will check in at the front desk to the right as you walk into the atrium. Valet Parking is offered if needed. - No appointment needed. You may go any day between 7 am and 6 pm.  If you have labs (blood work) drawn today and your tests are completely normal, you will receive your results only by: [name] MyChart Message (if you have MyChart) OR . A paper copy in the mail If you have any lab test that is abnormal or we need to change your treatment, we will call you to review the results.  Testing/Procedures: none  Follow-Up: At [facility], you and your health needs are our priority.  As part of our continuing mission to provide you with exceptional heart care, we have created designated Provider Care Teams.  These Care Teams include your primary Cardiologist (physician) and Advanced Practice Providers (APPs -  Physician Assistants and Nurse Practitioners) who all work together to provide you with the care you need, when you need it.  We recommend signing up for the patient portal called "MyChart".  Sign up information is provided on this After Visit Summary.  MyChart is used to connect with patients for Virtual Visits (Telemedicine).  Patients are able to view lab/test results, encounter notes, upcoming appointments, etc.  Non-urgent messages can be sent to your provider as well.   To learn more about what you can do with MyChart, go to [email or web address].    Your next appointment:   6 month(s)  The format for your next appointment:   In  Person  Provider:   You may see [name], MD or one of the following Advanced Practice Providers on your designated Care Team:    Murray Hodgkins, NP  Christell Faith, PA-C  Marrianne Mood, PA-C  Cadence Martelle, [place]  Laurann Montana, NP

## 2020-08-08 ENCOUNTER — Encounter: Payer: Self-pay | Admitting: Internal Medicine

## 2020-08-12 ENCOUNTER — Ambulatory Visit (INDEPENDENT_AMBULATORY_CARE_PROVIDER_SITE_OTHER): Payer: Medicare Other

## 2020-08-12 DIAGNOSIS — Z95810 Presence of automatic (implantable) cardiac defibrillator: Secondary | ICD-10-CM | POA: Diagnosis not present

## 2020-08-12 DIAGNOSIS — I5022 Chronic systolic (congestive) heart failure: Secondary | ICD-10-CM

## 2020-08-15 NOTE — Progress Notes (Signed)
EPIC Encounter for ICM Monitoring  Patient Name: Vanessa Carlson is a 59 y.o. female Date: 08/15/2020 Primary Care Physican: Virginia Crews, MD Primary Cardiologist: Carlis Abbott, NP HF Electrophysiologist: Caryl Comes 08/06/2020 OfficeWeight:230lbs   Transmission reviewed.  Optivol thoracic impedancenormal.  Prescribed:Furosemide40 mg2tablets (80 mg total) twice a day.   Labs: 08/06/2020 Creatinine 0.92, BUN 10, Potassium 3.8, Sodium 138, GFR >60 03/19/2020 Creatinine 0.94, BUN 17, Potassium 4.4, Sodium 141, GFR >60 12/19/2019 Creatinine 0.97, BUN 14, Potassium 4.8, Sodium 145, GFR 64-74 09/17/2019 Creatinine 0.89, BUN 13, Potassium 3.6, Sodium 140, GFR >60 A complete set of results can be found in Results Review.  Recommendations:No changes  Follow-up plan: ICM clinic phone appointment on2/09/2020. 91 day device clinic remote transmission1/25/2022.   EP/Cardiology Office Visits:5/22/2022with Dr. Caryl Comes.   Copy of ICM check sent to Angelina.  3 month ICM trend: 08/12/2020    1 Year ICM trend:       Vanessa Billings, RN 08/15/2020 10:02 AM

## 2020-08-20 ENCOUNTER — Ambulatory Visit: Payer: Medicare Other

## 2020-08-27 ENCOUNTER — Other Ambulatory Visit: Payer: Self-pay

## 2020-08-27 ENCOUNTER — Encounter: Payer: Self-pay | Admitting: Adult Health

## 2020-08-27 ENCOUNTER — Ambulatory Visit (INDEPENDENT_AMBULATORY_CARE_PROVIDER_SITE_OTHER): Payer: Medicare Other

## 2020-08-27 DIAGNOSIS — Z952 Presence of prosthetic heart valve: Secondary | ICD-10-CM

## 2020-08-27 DIAGNOSIS — I480 Paroxysmal atrial fibrillation: Secondary | ICD-10-CM

## 2020-08-27 LAB — POCT INR
INR: 3.8 — AB (ref 2.0–3.0)
PT: 45.7

## 2020-08-27 NOTE — Progress Notes (Signed)
Ok to continue current dosage of warfarin, recheck in 3 weeks.

## 2020-08-27 NOTE — Patient Instructions (Signed)
Description    Hold for 3 days and resume 7.5mg  daily.  F/u in 2 weeks

## 2020-09-10 ENCOUNTER — Other Ambulatory Visit: Payer: Self-pay

## 2020-09-10 ENCOUNTER — Ambulatory Visit (INDEPENDENT_AMBULATORY_CARE_PROVIDER_SITE_OTHER): Payer: Medicare HMO

## 2020-09-10 DIAGNOSIS — Z952 Presence of prosthetic heart valve: Secondary | ICD-10-CM

## 2020-09-10 DIAGNOSIS — I480 Paroxysmal atrial fibrillation: Secondary | ICD-10-CM | POA: Diagnosis not present

## 2020-09-10 LAB — POCT INR: INR: 3.1 — AB (ref 2.0–3.0)

## 2020-09-10 NOTE — Patient Instructions (Signed)
Description    7.5 mg qd. F/u in 2 weeks

## 2020-09-13 ENCOUNTER — Other Ambulatory Visit: Payer: Self-pay | Admitting: Family Medicine

## 2020-09-15 ENCOUNTER — Ambulatory Visit: Payer: Medicare Other

## 2020-09-15 ENCOUNTER — Ambulatory Visit: Payer: Medicare HMO

## 2020-09-15 ENCOUNTER — Inpatient Hospital Stay: Payer: Medicare HMO

## 2020-09-18 ENCOUNTER — Inpatient Hospital Stay: Payer: Medicare HMO | Admitting: Oncology

## 2020-09-25 ENCOUNTER — Ambulatory Visit
Admission: RE | Admit: 2020-09-25 | Discharge: 2020-09-25 | Disposition: A | Discharge status: Home / Self Care | Payer: Medicare HMO | Source: Ambulatory Visit | Attending: Oncology | Admitting: Oncology

## 2020-09-25 ENCOUNTER — Other Ambulatory Visit: Payer: Self-pay

## 2020-09-25 DIAGNOSIS — I2510 Atherosclerotic heart disease of native coronary artery without angina pectoris: Secondary | ICD-10-CM | POA: Diagnosis not present

## 2020-09-25 DIAGNOSIS — C3411 Malignant neoplasm of upper lobe, right bronchus or lung: Secondary | ICD-10-CM | POA: Insufficient documentation

## 2020-09-25 DIAGNOSIS — J984 Other disorders of lung: Secondary | ICD-10-CM | POA: Diagnosis not present

## 2020-09-25 DIAGNOSIS — Z85118 Personal history of other malignant neoplasm of bronchus and lung: Secondary | ICD-10-CM | POA: Diagnosis not present

## 2020-09-25 DIAGNOSIS — Z08 Encounter for follow-up examination after completed treatment for malignant neoplasm: Secondary | ICD-10-CM | POA: Insufficient documentation

## 2020-09-25 DIAGNOSIS — S2241XA Multiple fractures of ribs, right side, initial encounter for closed fracture: Secondary | ICD-10-CM | POA: Diagnosis not present

## 2020-09-30 ENCOUNTER — Encounter: Payer: Self-pay | Admitting: Oncology

## 2020-09-30 ENCOUNTER — Inpatient Hospital Stay: Payer: Medicare HMO | Attending: Oncology

## 2020-09-30 ENCOUNTER — Inpatient Hospital Stay: Payer: Medicare HMO | Admitting: Oncology

## 2020-09-30 VITALS — BP 124/58 | HR 80 | Temp 96.1°F | Resp 20 | Wt 229.6 lb

## 2020-09-30 DIAGNOSIS — C3411 Malignant neoplasm of upper lobe, right bronchus or lung: Secondary | ICD-10-CM | POA: Insufficient documentation

## 2020-09-30 DIAGNOSIS — I2510 Atherosclerotic heart disease of native coronary artery without angina pectoris: Secondary | ICD-10-CM | POA: Insufficient documentation

## 2020-09-30 DIAGNOSIS — I110 Hypertensive heart disease with heart failure: Secondary | ICD-10-CM | POA: Diagnosis not present

## 2020-09-30 DIAGNOSIS — Z08 Encounter for follow-up examination after completed treatment for malignant neoplasm: Secondary | ICD-10-CM | POA: Diagnosis not present

## 2020-09-30 DIAGNOSIS — Z8541 Personal history of malignant neoplasm of cervix uteri: Secondary | ICD-10-CM | POA: Diagnosis not present

## 2020-09-30 DIAGNOSIS — Z79899 Other long term (current) drug therapy: Secondary | ICD-10-CM | POA: Diagnosis not present

## 2020-09-30 DIAGNOSIS — Z87891 Personal history of nicotine dependence: Secondary | ICD-10-CM | POA: Insufficient documentation

## 2020-09-30 DIAGNOSIS — Z923 Personal history of irradiation: Secondary | ICD-10-CM | POA: Insufficient documentation

## 2020-09-30 DIAGNOSIS — I059 Rheumatic mitral valve disease, unspecified: Secondary | ICD-10-CM | POA: Diagnosis not present

## 2020-09-30 DIAGNOSIS — I5042 Chronic combined systolic (congestive) and diastolic (congestive) heart failure: Secondary | ICD-10-CM | POA: Insufficient documentation

## 2020-09-30 DIAGNOSIS — Z85118 Personal history of other malignant neoplasm of bronchus and lung: Secondary | ICD-10-CM

## 2020-09-30 DIAGNOSIS — Z7901 Long term (current) use of anticoagulants: Secondary | ICD-10-CM | POA: Diagnosis not present

## 2020-09-30 DIAGNOSIS — I252 Old myocardial infarction: Secondary | ICD-10-CM | POA: Diagnosis not present

## 2020-09-30 DIAGNOSIS — Z95810 Presence of automatic (implantable) cardiac defibrillator: Secondary | ICD-10-CM | POA: Diagnosis not present

## 2020-09-30 DIAGNOSIS — I255 Ischemic cardiomyopathy: Secondary | ICD-10-CM | POA: Diagnosis not present

## 2020-09-30 DIAGNOSIS — Z7982 Long term (current) use of aspirin: Secondary | ICD-10-CM | POA: Insufficient documentation

## 2020-09-30 LAB — CBC WITH DIFFERENTIAL/PLATELET
Abs Immature Granulocytes: 0.02 10*3/uL (ref 0.00–0.07)
Basophils Absolute: 0 10*3/uL (ref 0.0–0.1)
Basophils Relative: 0 %
Eosinophils Absolute: 0.1 10*3/uL (ref 0.0–0.5)
Eosinophils Relative: 1 %
HCT: 38.9 % (ref 36.0–46.0)
Hemoglobin: 12.7 g/dL (ref 12.0–15.0)
Immature Granulocytes: 0 %
Lymphocytes Relative: 25 %
Lymphs Abs: 1.4 10*3/uL (ref 0.7–4.0)
MCH: 28.8 pg (ref 26.0–34.0)
MCHC: 32.6 g/dL (ref 30.0–36.0)
MCV: 88.2 fL (ref 80.0–100.0)
Monocytes Absolute: 0.5 10*3/uL (ref 0.1–1.0)
Monocytes Relative: 9 %
Neutro Abs: 3.6 10*3/uL (ref 1.7–7.7)
Neutrophils Relative %: 65 %
Platelets: 155 10*3/uL (ref 150–400)
RBC: 4.41 MIL/uL (ref 3.87–5.11)
RDW: 14.9 % (ref 11.5–15.5)
WBC: 5.7 10*3/uL (ref 4.0–10.5)
nRBC: 0 % (ref 0.0–0.2)

## 2020-09-30 LAB — COMPREHENSIVE METABOLIC PANEL
ALT: 17 U/L (ref 0–44)
AST: 24 U/L (ref 15–41)
Albumin: 4 g/dL (ref 3.5–5.0)
Alkaline Phosphatase: 67 U/L (ref 38–126)
Anion gap: 8 (ref 5–15)
BUN: 13 mg/dL (ref 6–20)
CO2: 29 mmol/L (ref 22–32)
Calcium: 9 mg/dL (ref 8.9–10.3)
Chloride: 103 mmol/L (ref 98–111)
Creatinine, Ser: 0.97 mg/dL (ref 0.44–1.00)
GFR, Estimated: >60 mL/min (ref >60)
Glucose, Bld: 161 mg/dL — ABNORMAL HIGH (ref 70–99)
Potassium: 4.5 mmol/L (ref 3.5–5.1)
Sodium: 140 mmol/L (ref 135–145)
Total Bilirubin: 1.2 mg/dL (ref 0.3–1.2)
Total Protein: 7.5 g/dL (ref 6.5–8.1)

## 2020-09-30 NOTE — Progress Notes (Signed)
Hematology/Oncology Consult note [facility]  [phone] [phone]  Patient Care Team: Virginia Crews, MD as PCP - General (Family Medicine) End, Harrell Gave, MD as PCP - Cardiology (Cardiology) Nestor Lewandowsky, MD as Referring Physician (Cardiothoracic Surgery)   Name of the patient: Vanessa Carlson  [id number]  04/29/1961   Date of visit: 09/30/20  Diagnosis-stage I lung cancer s/p SBRT  Chief complaint/ Reason for visit-discuss CT scan results and further management  Heme/Onc history: Patient is a 60 year old female with a past medical history significant for CAD, ischemic cardiomyopathy status post AICD placement, rheumatic heart disease status post mechanical mitral valve replacement on Coumadin.She was found to have a right upper lobe lung mass 2.5 cm that was hypermetabolic on PET CT scan in January 2019. No evidence of nodal involvement. She was not deemed to be a candidate for surgery and underwent SBRT to this lesion. This was biopsy-proven adenocarcinoma with acinar and solid morphologies. She then had a CT scan in January 2020 which showed enlarging satellite right upper lobe lung nodules. This was followed by a PET CT scan which showed right upper lobe lung nodules 2 of them were hypermetabolic with an SUV of 8.7 was also a thyroid nodule approximately 4 mm in size which was not definitely hypermetabolic. Patient was seen by Dr. Donella Stade following this andsh receivedSBRT for 5 fractions encompassing all these 3 nodules. Patient has not had any systemic chemotherapy for her lung cancer so far  Interval history-patient feels well and denies any complaints at this time.  Appetite and weight is stable.  Denies any cough or shortness of breath  ECOG PS- 1 Pain scale- 0   Review of systems- Review of Systems  Constitutional: Negative for chills, fever, malaise/fatigue and weight loss.  HENT: Negative for congestion, ear  discharge and nosebleeds.   Eyes: Negative for blurred vision.  Respiratory: Negative for cough, hemoptysis, sputum production, shortness of breath and wheezing.   Cardiovascular: Negative for chest pain, palpitations, orthopnea and claudication.  Gastrointestinal: Negative for abdominal pain, blood in stool, constipation, diarrhea, heartburn, melena, nausea and vomiting.  Genitourinary: Negative for dysuria, flank pain, frequency, hematuria and urgency.  Musculoskeletal: Negative for back pain, joint pain and myalgias.  Skin: Negative for rash.  Neurological: Negative for dizziness, tingling, focal weakness, seizures, weakness and headaches.  Endo/Heme/Allergies: Does not bruise/bleed easily.  Psychiatric/Behavioral: Negative for depression and suicidal ideas. The patient does not have insomnia.      No Known Allergies   Past Medical History:  Diagnosis Date  . AICD (automatic cardioverter/defibrillator) present   . Anterior myocardial infarction [facility])    a. 10/2014 - occluded LAD, complicated by cardiogenic shock-->Med Rx as interventional team was unable to open LAD.  [name] Cancer of upper lobe of right lung ([place]) 09/2017   radiation therapy right side  . Cervical cancer ([place])    in remission for ~20 yrs, s/p hysterectomy  . Chronic combined systolic and diastolic CHF (congestive heart failure) ([place])    a. 10/2015 Echo: EF 20-25%, sev diast dysfxn, mildly reduced RV fxn. Nl MV prosthesis; b. 12/2017 Echo: EF 20-25%, diff HK. Sev ant/antsept HK, apical AK. Mild MS (mean graad 71mmHg). Mod TR. PASP 40mmHg.  [name] Coronary artery disease    a. 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  . Essential hypertension   . Ischemic cardiomyopathy    a. 03/2015 s/p MDT single  lead AICD;  b. 10/2015 Echo: EF 20-25%;  c. 12/2017 Echo: EF 20-25%, diff HK.  . Mass of upper lobe of right lung    a. noted on CXR & CT 08/2017 w/  abnl PET CT.  [name] Mitral valve disease    a. 2006 s/p MVR w/ 36mm SJM bileaflet mechanical valve-->chronic coumadin/ASA; b. 12/2017 Echo: Mild MS. Mean gradient 29mmHg.  [name] Personal history of radiation therapy    f/u lung ca     Past Surgical History:  Procedure Laterality Date  . APPENDECTOMY  1998  . BREAST EXCISIONAL BIOPSY Left 80s   benign  . CARDIAC CATHETERIZATION    . CARDIAC DEFIBRILLATOR PLACEMENT    . COLONOSCOPY WITH PROPOFOL N/A 01/11/2020   Procedure: COLONOSCOPY WITH PROPOFOL;  Surgeon: Jonathon Bellows, MD;  Location[facility] ENDOSCOPY;  Service: Gastroenterology;  Laterality: N/A;  . RADICAL HYSTERECTOMY  1998  . SHOULDER SURGERY Bilateral    rotator cuff tears  . VALVE REPLACEMENT     Mitral valve; 27 mm St. Jude bileaflet valve    Social History   Socioeconomic History  . Marital status: Married    Spouse name: Tosin  . Number of children: 2  . Years of education: 56  . Highest education level: Some college, no degree  Occupational History    Employer: [employer]  Tobacco Use  . Smoking status: Former Smoker    Packs/day: 1.00    Years: 4.00    Pack years: 4.00    Types: Cigarettes    Quit date: 1998    Years since quitting: 24.1  . Smokeless tobacco: Never Used  Vaping Use  . Vaping Use: Never used  Substance and Sexual Activity  . Alcohol use: No  . Drug use: No  . Sexual activity: Yes    Partners: Male    Birth control/protection: Surgical  Other Topics Concern  . Not on file  Social History Narrative  . Not on file   Social Determinants of Health   Financial Resource Strain: Low Risk   . Difficulty of Paying Living Expenses: Not hard at all  Food Insecurity: No Food Insecurity  . Worried About Charity fundraiser in the Last Year: Never true  . Ran Out of Food in the Last Year: Never true  Transportation Needs: No Transportation Needs  . Lack of Transportation (Medical): No  . Lack of Transportation (Non-Medical): No  Physical  Activity: Inactive  . Days of Exercise per Week: 0 days  . Minutes of Exercise per Session: 0 min  Stress: No Stress Concern Present  . Feeling of Stress : Not at all  Social Connections: Moderately Integrated  . Frequency of Communication with Friends and Family: More than three times a week  . Frequency of Social Gatherings with Friends and Family: Twice a week  . Attends Religious Services: More than 4 times per year  . Active Member of Clubs or Organizations: No  . Attends Archivist Meetings: Never  . Marital Status: Married  Human resources officer Violence: Not At Risk  . Fear of Current or Ex-Partner: No  . Emotionally Abused: No  . Physically Abused: No  . Sexually Abused: No    Family History  Problem Relation Age of Onset  . Heart attack Mother   . Hypertension Mother   . Diabetes Mother   . Emphysema Father        smoker  . Diabetes Maternal Grandmother   . Kidney disease Maternal Grandmother   .  Breast cancer Maternal Aunt   . Breast cancer Maternal Aunt   . Colon cancer Maternal Uncle      Current Outpatient Medications:  .  aspirin 81 MG chewable tablet, Chew 81 mg by mouth daily., Disp: , Rfl:  .  atorvastatin (LIPITOR) 80 MG tablet, Take 1 tablet by mouth once daily, Disp: 90 tablet, Rfl: 1 .  cyclobenzaprine (FLEXERIL) 5 MG tablet, Take 1 tablet (5 mg total) by mouth 3 (three) times daily as needed for muscle spasms., Disp: 30 tablet, Rfl: 1 .  fluticasone (FLONASE) 50 MCG/ACT nasal spray, Place 2 sprays into both nostrils as needed for allergies or rhinitis., Disp: , Rfl:  .  furosemide (LASIX) 40 MG tablet, Take 2 tablets by mouth twice daily, Disp: 360 tablet, Rfl: 0 .  gabapentin (NEURONTIN) 300 MG capsule, Take 1 capsule (300 mg total) by mouth 2 (two) times daily., Disp: 180 capsule, Rfl: 3 .  metoprolol succinate (TOPROL-XL) 50 MG 24 hr tablet, Take 0.5 tablets (25 mg total) by mouth in the morning and at bedtime. TAKE  WITH  OR  IMMEDIATELY   FOLLOWING  A  MEAL. Please keep your upcoming appointment for refills., Disp: 90 tablet, Rfl: 0 .  nitroGLYCERIN (NITROSTAT) 0.3 MG SL tablet, Place 1 tablet (0.3 mg total) under the tongue every 5 (five) minutes as needed for chest pain., Disp: 30 tablet, Rfl: 3 .  sacubitril-valsartan (ENTRESTO) 24-26 MG, Take 1 tablet by mouth 2 (two) times daily., Disp: 180 tablet, Rfl: 3 .  spironolactone (ALDACTONE) 25 MG tablet, Take 1 tablet by mouth once daily, Disp: 90 tablet, Rfl: 0 .  traMADol (ULTRAM) 50 MG tablet, TAKE 1 TABLET BY MOUTH EVERY 6 HOURS AS NEEDED, Disp: 30 tablet, Rfl: 2 .  traZODone (DESYREL) 50 MG tablet, Take 0.5-1 tablets (25-50 mg total) by mouth at bedtime as needed for sleep., Disp: 30 tablet, Rfl: 3 .  warfarin (COUMADIN) 10 MG tablet, Take 1 tablet (10 mg total) by mouth as directed., Disp: 30 tablet, Rfl: 3 .  warfarin (COUMADIN) 7.5 MG tablet, Take 1 tablet (7.5 mg total) by mouth daily., Disp: 90 tablet, Rfl: 3  Physical exam:  Vitals:   09/30/20 1001  BP: (!) 124/58  Pulse: 80  Resp: 20  Temp: (!) 96.1 F (35.6 C)  TempSrc: Tympanic  SpO2: 100%  Weight: 229 lb 9.6 oz (104.1 kg)   Physical Exam Constitutional:      General: She is not in acute distress. Eyes:     Extraocular Movements: EOM normal.  Cardiovascular:     Rate and Rhythm: Normal rate and regular rhythm.     Heart sounds: Normal heart sounds.  Pulmonary:     Effort: Pulmonary effort is normal.     Breath sounds: Normal breath sounds.  Abdominal:     General: Bowel sounds are normal.     Palpations: Abdomen is soft.  Skin:    General: Skin is warm and dry.  Neurological:     Mental Status: She is alert and oriented to person, place, and time.      CMP Latest Ref Rng & Units 09/30/2020  Glucose 70 - 99 mg/dL 161(H)  BUN 6 - 20 mg/dL 13  Creatinine 0.44 - 1.00 mg/dL 0.97  Sodium 135 - 145 mmol/L 140  Potassium 3.5 - 5.1 mmol/L 4.5  Chloride 98 - 111 mmol/L 103  CO2 22 - 32 mmol/L 29   Calcium 8.9 - 10.3 mg/dL 9.0  Total Protein 6.5 -  8.1 g/dL 7.5  Total Bilirubin 0.3 - 1.2 mg/dL 1.2  Alkaline Phos 38 - 126 U/L 67  AST 15 - 41 U/L 24  ALT 0 - 44 U/L 17   CBC Latest Ref Rng & Units 09/30/2020  WBC 4.0 - 10.5 K/uL 5.7  Hemoglobin 12.0 - 15.0 g/dL 12.7  Hematocrit 36.0 - 46.0 % 38.9  Platelets 150 - 400 K/uL 155    No images are attached to the encounter.  CT Chest Wo Contrast  Result Date: 09/25/2020 CLINICAL DATA:  Restaging right upper lobe lung cancer, prior radiation therapy finished in March 2020. EXAM: CT CHEST WITHOUT CONTRAST TECHNIQUE: Multidetector CT imaging of the chest was performed following the standard protocol without IV contrast. COMPARISON:  Chest CT 03/19/2020 FINDINGS: Cardiovascular: Defibrillator and mitral valve prosthesis noted. Coronary, aortic arch, and branch vessel atherosclerotic vascular disease. Mild cardiomegaly. Faint myocardial calcifications along the left ventricular apex, as before, probably related to a remote infarct. Mediastinum/Nodes: No pathologic adenopathy identified. Mild distal esophageal wall thickening, nonspecific although esophagitis would be a common cause. Lungs/Pleura: Bandlike scarring in the right upper lobe extends in the suprahilar and hilar regions, with a nodular anterior superior component mildly increased in size from the prior exam. For example, the nodular component anteriorly on image 38 of series 3 measures 0.9 by 1.4 cm, previously 0.7 by 0.7 cm. Just below this, a nodular anterior component measures 1.1 cm in thickness on image 41 of series 3, previously 0.8 cm. Otherwise the contour of this right suprahilar bandlike density is unchanged. Mild subpleural reticulation in the lung bases similar to prior. Upper Abdomen: Abdominal aortic atherosclerosis. Musculoskeletal: Chronic fractures the right second and third ribs probably related to prior radiation therapy. IMPRESSION: 1. Mild increase in size of the nodular  anterior superior component of the right upper lobe bandlike scarring. Locally recurrent neoplasm is not readily excluded although the appearance could be inflammatory or from progressive scarring. Possibilities for workup might include close imaging surveillance, biopsy, or nuclear medicine PET-CT. 2. Other imaging findings of potential clinical significance: Coronary, aortic arch, and branch vessel atherosclerotic vascular disease. Mild cardiomegaly. Faint myocardial calcifications along the left ventricular apex, probably related to a remote infarct. Mild distal esophageal wall thickening, nonspecific although esophagitis would be a common cause. Chronic fractures the right second and third ribs probably related to prior radiation therapy. Mild subpleural reticulation in the lung bases similar to prior. 3. Aortic atherosclerosis. Aortic Atherosclerosis (ICD10-I70.0). Electronically Signed   By: Van Clines M.D.   On: 09/25/2020 12:56     Assessment and plan- Patient is a 60 y.o. female with history of stage I adenocarcinoma of the left upper lobe of the lung s/p SBRT.  She is here to discuss CT scan results and further management  I have reviewed CT chest images independently and discussed findings with the patient.  She had To right upper lobe lung nodules which we have been monitoring over the last 1 year.  The nodular anterior superior component of the lung nodule is increased from 0.7 x 0.7 cm to 0.9 x 1.4 cm.  Another anterior right upper lobe nodule has increased from 0.8 to 1.1 cm.  No other evidence of progressive disease.  I am inclined to monitor this conservatively with a repeat CT chest without contrast in 2 months.  If there is a continued increase in the size of nodules I will consider getting a PET CT scan followed by discussion at tumor board.  Patient  verbalized understanding of the plan    Visit Diagnosis 1. Encounter for follow-up surveillance of lung cancer      Dr.  Randa Evens, MD, MPH [facility] [place] [phone] 09/30/2020 12:10 PM

## 2020-10-01 ENCOUNTER — Ambulatory Visit: Payer: Self-pay

## 2020-10-01 ENCOUNTER — Encounter: Payer: Self-pay | Admitting: Oncology

## 2020-10-07 ENCOUNTER — Ambulatory Visit (INDEPENDENT_AMBULATORY_CARE_PROVIDER_SITE_OTHER): Payer: Medicare HMO

## 2020-10-07 DIAGNOSIS — I428 Other cardiomyopathies: Secondary | ICD-10-CM | POA: Diagnosis not present

## 2020-10-07 LAB — CUP PACEART REMOTE DEVICE CHECK
Battery Remaining Longevity: 79 mo
Battery Voltage: 2.99 V
Brady Statistic RV Percent Paced: 0 %
Date Time Interrogation Session: 20220208125747
HighPow Impedance: 98 Ohm
Implantable Lead Implant Date: 20160802
Implantable Lead Location: 753860
Implantable Pulse Generator Implant Date: 20160802
Lead Channel Impedance Value: 513 Ohm
Lead Channel Impedance Value: 608 Ohm
Lead Channel Pacing Threshold Amplitude: 0.875 V
Lead Channel Pacing Threshold Pulse Width: 0.4 ms
Lead Channel Sensing Intrinsic Amplitude: 13.875 mV
Lead Channel Sensing Intrinsic Amplitude: 13.875 mV
Lead Channel Setting Pacing Amplitude: 2 V
Lead Channel Setting Pacing Pulse Width: 0.4 ms
Lead Channel Setting Sensing Sensitivity: 0.3 mV

## 2020-10-13 NOTE — Progress Notes (Signed)
Remote ICD transmission.   

## 2020-10-15 ENCOUNTER — Other Ambulatory Visit: Payer: Self-pay

## 2020-10-15 ENCOUNTER — Ambulatory Visit (INDEPENDENT_AMBULATORY_CARE_PROVIDER_SITE_OTHER): Payer: Medicare HMO

## 2020-10-15 DIAGNOSIS — Z952 Presence of prosthetic heart valve: Secondary | ICD-10-CM

## 2020-10-15 DIAGNOSIS — I480 Paroxysmal atrial fibrillation: Secondary | ICD-10-CM | POA: Diagnosis not present

## 2020-10-15 LAB — POCT INR
INR: 3.9 — AB (ref 2.0–3.0)
PT: 47.2

## 2020-10-15 NOTE — Patient Instructions (Signed)
Description    7.5 mg qd except 5 mg on Wed and Sat. F/u in 2 weeks

## 2020-10-16 ENCOUNTER — Ambulatory Visit: Payer: Medicare Other

## 2020-10-20 NOTE — Progress Notes (Signed)
Subjective:   Vanessa Carlson is a 60 y.o. female who presents for Medicare Annual (Subsequent) preventive examination.   Review of Systems    N/A  Cardiac Risk Factors include: advanced age (>31men, >45 women);dyslipidemia;obesity (BMI >30kg/m2);hypertension     Objective:    Today's Vitals   10/21/20 0947  BP: 107/66  Pulse: 75  Temp: 99.1 F (37.3 C)  TempSrc: Oral  SpO2: 97%  Weight: 231 lb (104.8 kg)  Height: 5\' 2"  (1.575 m)  PainSc: 7    Body mass index is 42.25 kg/m.  Advanced Directives 10/21/2020 03/21/2020 01/11/2020 10/15/2019 09/20/2019 03/16/2019 01/19/2019  Does Patient Have a Medical Advance Directive? No No No No No No No  Would patient like information on creating a medical advance directive? No - Patient declined No - Patient declined - No - Patient declined No - Patient declined - No - Patient declined    Current Medications (verified) Outpatient Encounter Medications as of 10/21/2020  Medication Sig   acetaminophen (TYLENOL) 325 MG tablet Take 650 mg by mouth every 6 (six) hours as needed.   aspirin 81 MG chewable tablet Chew 81 mg by mouth daily.   atorvastatin (LIPITOR) 80 MG tablet Take 1 tablet by mouth once daily   cyclobenzaprine (FLEXERIL) 5 MG tablet Take 1 tablet (5 mg total) by mouth 3 (three) times daily as needed for muscle spasms.   fluticasone (FLONASE) 50 MCG/ACT nasal spray Place 2 sprays into both nostrils as needed for allergies or rhinitis.   furosemide (LASIX) 40 MG tablet Take 2 tablets by mouth twice daily   gabapentin (NEURONTIN) 300 MG capsule Take 1 capsule (300 mg total) by mouth 2 (two) times daily.   metoprolol succinate (TOPROL-XL) 50 MG 24 hr tablet Take 0.5 tablets (25 mg total) by mouth in the morning and at bedtime. TAKE  WITH  OR  IMMEDIATELY  FOLLOWING  A  MEAL. Please keep your upcoming appointment for refills.   nitroGLYCERIN (NITROSTAT) 0.3 MG SL tablet Place 1 tablet (0.3 mg total) under the tongue every 5  (five) minutes as needed for chest pain.   sacubitril-valsartan (ENTRESTO) 24-26 MG Take 1 tablet by mouth 2 (two) times daily.   spironolactone (ALDACTONE) 25 MG tablet Take 1 tablet by mouth once daily   traMADol (ULTRAM) 50 MG tablet TAKE 1 TABLET BY MOUTH EVERY 6 HOURS AS NEEDED   traZODone (DESYREL) 50 MG tablet Take 0.5-1 tablets (25-50 mg total) by mouth at bedtime as needed for sleep.   warfarin (COUMADIN) 10 MG tablet Take 1 tablet (10 mg total) by mouth as directed.   warfarin (COUMADIN) 7.5 MG tablet Take 1 tablet (7.5 mg total) by mouth daily.   No facility-administered encounter medications on file as of 10/21/2020.    Allergies (verified) Patient has no known allergies.   History: Past Medical History:  Diagnosis Date   AICD (automatic cardioverter/defibrillator) present    Anterior myocardial infarction ([place])    a. 10/2014 - occluded LAD, complicated by cardiogenic shock-->Med Rx as interventional team was unable to open LAD.   Cancer of upper lobe of right lung ([place]) 09/2017   radiation therapy right side   Cervical cancer ([place])    in remission for ~20 yrs, s/p hysterectomy   Chronic combined systolic and diastolic CHF (congestive heart failure) ([place])    a. 10/2015 Echo: EF 20-25%, sev diast dysfxn, mildly reduced RV fxn. Nl MV prosthesis; b. 12/2017 Echo: EF 20-25%, diff HK. Sev ant/antsept HK, apical  AK. Mild MS (mean graad 65mmHg). Mod TR. PASP 40mmHg.   Coronary artery disease    a. 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   Essential hypertension    Ischemic cardiomyopathy    a. 03/2015 s/p MDT single lead AICD;  b. 10/2015 Echo: EF 20-25%;  c. 12/2017 Echo: EF 20-25%, diff HK.   Mass of upper lobe of right lung    a. noted on CXR & CT 08/2017 w/ abnl PET CT.   Mitral valve disease    a. 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  12/2017 Echo: Mild MS. Mean gradient 41mmHg.   Personal history of radiation therapy    f/u lung ca   Past Surgical History:  Procedure Laterality Date   APPENDECTOMY  1998   BREAST EXCISIONAL BIOPSY Left 80s   benign   CARDIAC CATHETERIZATION     CARDIAC DEFIBRILLATOR PLACEMENT     COLONOSCOPY WITH PROPOFOL N/A 01/11/2020   Procedure: COLONOSCOPY WITH PROPOFOL;  Surgeon: Jonathon Bellows, MD;  Location: [facility] ENDOSCOPY;  Service: Gastroenterology;  Laterality: N/A;   RADICAL HYSTERECTOMY  1998   SHOULDER SURGERY Bilateral    rotator cuff tears   VALVE REPLACEMENT     Mitral valve; 27 mm St. Jude bileaflet valve   Family History  Problem Relation Age of Onset   Heart attack Mother    Hypertension Mother    Diabetes Mother    Emphysema Father        smoker   Diabetes Maternal Grandmother    Kidney disease Maternal Grandmother    Breast cancer Maternal Aunt    Breast cancer Maternal Aunt    Colon cancer Maternal Uncle    Social History   Socioeconomic History   Marital status: Married    Spouse name: Tosin   Number of children: 2   Years of education: 14   Highest education level: Some college, no degree  Occupational History   Occupation: disable  Tobacco Use   Smoking status: Former Smoker    Packs/day: 1.00    Years: 4.00    Pack years: 4.00    Types: Cigarettes    Quit date: 1998    Years since quitting: 24.1   Smokeless tobacco: Never Used  Scientific laboratory technician Use: Never used  Substance and Sexual Activity   Alcohol use: No   Drug use: No   Sexual activity: Yes    Partners: Male    Birth control/protection: Surgical  Other Topics Concern   Not on file  Social History Narrative   Not on file   Social Determinants of Health   Financial Resource Strain: Low Risk    Difficulty of Paying Living Expenses: Not hard at all  Food Insecurity: No Food Insecurity   Worried About Charity fundraiser in the Last Year: Never true    Arboriculturist in the Last Year: Never true  Transportation Needs: No Transportation Needs   Lack of Transportation (Medical): No   Lack of Transportation (Non-Medical): No  Physical Activity: Inactive   Days of Exercise per Week: 0 days   Minutes of Exercise per Session: 0 min  Stress: No Stress Concern Present   Feeling of Stress : Not at all  Social Connections: Socially Integrated   Frequency of Communication with Friends and Family: More than three times a week   Frequency of Social Gatherings  with Friends and Family: More than three times a week   Attends Religious Services: More than 4 times per year   Active Member of Clubs or Organizations: Yes   Attends Music therapist: More than 4 times per year   Marital Status: Married    Tobacco Counseling Counseling given: Not Answered   Clinical Intake:  Pre-visit preparation completed: Yes  Pain : 0-10 Pain Score: 7  Pain Type: Chronic pain Pain Location: Knee (and left ankle) Pain Orientation: Right Pain Descriptors / Indicators: Aching Pain Frequency: Constant Pain Relieving Factors: Takes Tylenol as needed for pain.  Pain Relieving Factors: Takes Tylenol as needed for pain.  Nutritional Status: BMI > 30  Obese Nutritional Risks: None Diabetes: No  How often do you need to have someone help you when you read instructions, pamphlets, or other written materials from your doctor or pharmacy?: 1 - Never  Diabetic? No  Interpreter Needed?: No  Information entered by :: [facility], LPN   Activities of Daily Living In your present state of health, do you have any difficulty performing the following activities: 10/21/2020  Hearing? Y  Comment Due to ringing in right ear.  Vision? N  Difficulty concentrating or making decisions? N  Walking or climbing stairs? Y  Comment Due to knee pains.  Dressing or bathing? N  Doing errands, shopping? N  Preparing Food and eating ? N  Using the Toilet?  N  In the past six months, have you accidently leaked urine? N  Do you have problems with loss of bowel control? N  Managing your Medications? N  Managing your Finances? N  Housekeeping or managing your Housekeeping? N  Some recent data might be hidden    Patient Care Team: Virginia Crews, MD as PCP - General (Family Medicine) End, Harrell Gave, MD as PCP - Cardiology (Cardiology) Nestor Lewandowsky, MD as Referring Physician (Cardiothoracic Surgery) Sindy Guadeloupe, MD as Consulting Physician (Oncology)  Indicate any recent Medical Services you may have received from other than [facility] providers in the past year (date may be approximate).     Assessment:   This is a routine wellness examination for Vanessa Carlson.  Hearing/Vision screen No exam data present  Dietary issues and exercise activities discussed: Current Exercise Habits: Home exercise routine, Type of exercise: Other - see comments;strength training/weights (PT exercises), Time (Minutes): 10, Frequency (Times/Week): 3, Weekly Exercise (Minutes/Week): 30, Exercise limited by: orthopedic condition(s)  Goals     Exercise 3x per week (30 min per time)     Recommend to exercise for 3 days a week for at least 30 minutes at a time.       Depression Screen PHQ 2/9 Scores 10/21/2020 12/14/2019 10/15/2019 05/31/2018 03/29/2018 03/08/2018 02/28/2018  PHQ - 2 Score 0 1 0 0 0 0 0  PHQ- 9 Score - 1 - - - - -    Fall Risk Fall Risk  10/21/2020 12/14/2019 10/15/2019 05/31/2018 03/29/2018  Falls in the past year? 0 1 1 No No  Number falls in past yr: 0 0 0 - -  Injury with Fall? 0 1 1 - -  Comment - - sprained right knee - -  Risk for fall due to : - Other (Comment) - - -  Follow up - Falls evaluation completed Falls prevention discussed - -    FALL RISK PREVENTION PERTAINING TO THE HOME:  Any stairs in or around the home? Yes  If so, are there any without handrails? No  Home  free of loose throw rugs in walkways, pet beds, electrical cords,  etc? Yes  Adequate lighting in your home to reduce risk of falls? Yes   ASSISTIVE DEVICES UTILIZED TO PREVENT FALLS:  Life alert? No  Use of a cane, walker or w/c? No  Grab bars in the bathroom? Yes  Shower chair or bench in shower? No  Elevated toilet seat or a handicapped toilet? No   TIMED UP AND GO:  Was the test performed? Yes .  Length of time to ambulate 10 feet: 9 sec.   Gait steady and fast without use of assistive device  Cognitive Function: Normal cognitive status assessed by direct observation by this Nurse Health Advisor. No abnormalities found.       6CIT Screen 12/14/2019 10/15/2019 03/08/2018  What Year? 0 points 0 points 0 points  What month? 0 points 0 points 0 points  What time? 0 points 0 points 0 points  Count back from 20 0 points 0 points 0 points  Months in reverse 0 points 2 points 0 points  Repeat phrase 2 points 0 points 2 points  Total Score 2 2 2     Immunizations Immunization History  Administered Date(s) Administered   Influenza Split 05/17/2016   Influenza,inj,Quad PF,6+ Mos 06/20/2015, 07/08/2017, 09/08/2018   PFIZER(Purple Top)SARS-COV-2 Vaccination 06/05/2020, 06/28/2020   Pneumococcal Polysaccharide-23 12/03/2008    TDAP status: Due, Education has been provided regarding the importance of this vaccine. Advised may receive this vaccine at local pharmacy or Health Dept. Aware to provide a copy of the vaccination record if obtained from local pharmacy or Health Dept. Verbalized acceptance and understanding.  Flu Vaccine status: Declined, Education has been provided regarding the importance of this vaccine but patient still declined. Advised may receive this vaccine at local pharmacy or Health Dept. Aware to provide a copy of the vaccination record if obtained from local pharmacy or Health Dept. Verbalized acceptance and understanding.  Covid-19 vaccine status: Completed vaccines  Qualifies for Shingles Vaccine? Yes   Zostavax completed  No   Shingrix Completed?: No.    Education has been provided regarding the importance of this vaccine. Patient has been advised to call insurance company to determine out of pocket expense if they have not yet received this vaccine. Advised may also receive vaccine at local pharmacy or Health Dept. Verbalized acceptance and understanding.  Screening Tests Health Maintenance  Topic Date Due   COVID-19 Vaccine (3 - Pfizer risk 4-dose series) 07/26/2020   INFLUENZA VACCINE  11/27/2020 (Originally 03/30/2020)   TETANUS/TDAP  09/09/2023 (Originally [birth year]/30/1978)   PAP SMEAR-Modifier  03/08/2021   MAMMOGRAM  09/11/2021   COLONOSCOPY (Pts 45-76yrs Insurance coverage will need to be confirmed)  01/10/2030   Hepatitis C Screening  Completed   HIV Screening  Completed    Health Maintenance  Health Maintenance Due  Topic Date Due   COVID-19 Vaccine (3 - Pfizer risk 4-dose series) 07/26/2020    Colorectal cancer screening: Type of screening: Colonoscopy. Completed 01/11/20. Repeat every 10 years  Mammogram status: Ordered today. Pt provided with contact info and advised to call to schedule appt.    Lung Cancer Screening: (Low Dose CT Chest recommended if Age 40-80 years, 30 pack-year currently smoking OR have quit w/in 15years.) does not qualify.   Additional Screening:  Hepatitis C Screening: Up to date  Vision Screening: Recommended annual ophthalmology exams for early detection of glaucoma and other disorders of the eye. Is the patient up to date with their annual eye  exam? No Who is the provider or what is the name of the office in which the patient attends annual eye exams? n/a If pt is not established with a provider, would they like to be referred to a provider to establish care? No .   Dental Screening: Recommended annual dental exams for proper oral hygiene  Community Resource Referral / Chronic Care Management: CRR required this visit?  No   CCM required this visit?  No       Plan:     I have personally reviewed and noted the following in the patients chart:    Medical and social history  Use of alcohol, tobacco or illicit drugs   Current medications and supplements  Functional ability and status  Nutritional status  Physical activity  Advanced directives  List of other physicians  Hospitalizations, surgeries, and ER visits in previous 12 months  Vitals  Screenings to include cognitive, depression, and falls  Referrals and appointments  In addition, I have reviewed and discussed with patient certain preventive protocols, quality metrics, and best practice recommendations. A written personalized care plan for preventive services as well as general preventive health recommendations were provided to patient.     [place] Searles [place], [place]   9/48/3475   Nurse Notes: Declined receiving a flu vaccine this season.

## 2020-10-21 ENCOUNTER — Other Ambulatory Visit: Payer: Self-pay

## 2020-10-21 ENCOUNTER — Ambulatory Visit (INDEPENDENT_AMBULATORY_CARE_PROVIDER_SITE_OTHER): Payer: Medicare HMO

## 2020-10-21 VITALS — BP 107/66 | HR 75 | Temp 99.1°F | Ht 62.0 in | Wt 231.0 lb

## 2020-10-21 DIAGNOSIS — Z1231 Encounter for screening mammogram for malignant neoplasm of breast: Secondary | ICD-10-CM

## 2020-10-21 DIAGNOSIS — Z0000 Encounter for general adult medical examination without abnormal findings: Secondary | ICD-10-CM | POA: Diagnosis not present

## 2020-10-21 NOTE — Patient Instructions (Signed)
Vanessa Carlson , Thank you for taking time to come for your Medicare Wellness Visit. I appreciate your ongoing commitment to your health goals. Please review the following plan we discussed and let me know if I can assist you in the future.   Screening recommendations/referrals: Colonoscopy: Up to date, due 12/2029 Mammogram: Ordered today. Pt aware to contact [place] to schedule mammogram. Recommended yearly ophthalmology/optometry visit for glaucoma screening and checkup Recommended yearly dental visit for hygiene and checkup  Vaccinations: Influenza vaccine: Currently due, declined receiving this season. Tdap vaccine: Currently due, declined receiving. Shingles vaccine: Shingrix discussed. Please contact your pharmacy for coverage information.     Advanced directives: Advance directive discussed with you today. Even though you declined this today please call our office should you change your mind and we can give you the proper paperwork for you to fill out.  Conditions/risks identified: Recommend to start exercising moderately 3 days a week for at least 30 minutes at a time.  Next appointment: 10/29/20 @ 9:20 AM for a PT/INR check   Preventive Care 40-64 Years, Female Preventive care refers to lifestyle choices and visits with your health care provider that can promote health and wellness. What does preventive care include?  A yearly physical exam. This is also called an annual well check.  Dental exams once or twice a year.  Routine eye exams. Ask your health care provider how often you should have your eyes checked.  Personal lifestyle choices, including:  Daily care of your teeth and gums.  Regular physical activity.  Eating a healthy diet.  Avoiding tobacco and drug use.  Limiting alcohol use.  Practicing safe sex.  Taking low-dose aspirin daily starting at age 28.  Taking vitamin and mineral supplements as recommended by your health care provider. What happens  during an annual well check? The services and screenings done by your health care provider during your annual well check will depend on your age, overall health, lifestyle risk factors, and family history of disease. Counseling  Your health care provider may ask you questions about your:  Alcohol use.  Tobacco use.  Drug use.  Emotional well-being.  Home and relationship well-being.  Sexual activity.  Eating habits.  Work and work Statistician.  Method of birth control.  Menstrual cycle.  Pregnancy history. Screening  You may have the following tests or measurements:  Height, weight, and BMI.  Blood pressure.  Lipid and cholesterol levels. These may be checked every 5 years, or more frequently if you are over 25 years old.  Skin check.  Lung cancer screening. You may have this screening every year starting at age 83 if you have a 30-pack-year history of smoking and currently smoke or have quit within the past 15 years.  Fecal occult blood test (FOBT) of the stool. You may have this test every year starting at age 32.  Flexible sigmoidoscopy or colonoscopy. You may have a sigmoidoscopy every 5 years or a colonoscopy every 10 years starting at age 43.  Hepatitis C blood test.  Hepatitis B blood test.  Sexually transmitted disease (STD) testing.  Diabetes screening. This is done by checking your blood sugar (glucose) after you have not eaten for a while (fasting). You may have this done every 1-3 years.  Mammogram. This may be done every 1-2 years. Talk to your health care provider about when you should start having regular mammograms. This may depend on whether you have a family history of breast cancer.  BRCA-related cancer screening.  This may be done if you have a family history of breast, ovarian, tubal, or peritoneal cancers.  Pelvic exam and Pap test. This may be done every 3 years starting at age 40. Starting at age 20, this may be done every 5 years if you  have a Pap test in combination with an HPV test.  Bone density scan. This is done to screen for osteoporosis. You may have this scan if you are at high risk for osteoporosis. Discuss your test results, treatment options, and if necessary, the need for more tests with your health care provider. Vaccines  Your health care provider may recommend certain vaccines, such as:  Influenza vaccine. This is recommended every year.  Tetanus, diphtheria, and acellular pertussis (Tdap, Td) vaccine. You may need a Td booster every 10 years.  Zoster vaccine. You may need this after age 66.  Pneumococcal 13-valent conjugate (PCV13) vaccine. You may need this if you have certain conditions and were not previously vaccinated.  Pneumococcal polysaccharide (PPSV23) vaccine. You may need one or two doses if you smoke cigarettes or if you have certain conditions. Talk to your health care provider about which screenings and vaccines you need and how often you need them. This information is not intended to replace advice given to you by your health care provider. Make sure you discuss any questions you have with your health care provider. Document Released: 09/12/2015 Document Revised: 05/05/2016 Document Reviewed: 06/17/2015 Elsevier Interactive Patient Education  2017 [place] Prevention in the Home Falls can cause injuries. They can happen to people of all ages. There are many things you can do to make your home safe and to help prevent falls. What can I do on the outside of my home?  Regularly fix the edges of walkways and driveways and fix any cracks.  Remove anything that might make you trip as you walk through a door, such as a raised step or threshold.  Trim any bushes or trees on the path to your home.  Use bright outdoor lighting.  Clear any walking paths of anything that might make someone trip, such as rocks or tools.  Regularly check to see if handrails are loose or broken.  Make sure that both sides of any steps have handrails.  Any raised decks and porches should have guardrails on the edges.  Have any leaves, snow, or ice cleared regularly.  Use sand or salt on walking paths during winter.  Clean up any spills in your garage right away. This includes oil or grease spills. What can I do in the bathroom?  Use night lights.  Install grab bars by the toilet and in the tub and shower. Do not use towel bars as grab bars.  Use non-skid mats or decals in the tub or shower.  If you need to sit down in the shower, use a plastic, non-slip stool.  Keep the floor dry. Clean up any water that spills on the floor as soon as it happens.  Remove soap buildup in the tub or shower regularly.  Attach bath mats securely with double-sided non-slip rug tape.  Do not have throw rugs and other things on the floor that can make you trip. What can I do in the bedroom?  Use night lights.  Make sure that you have a light by your bed that is easy to reach.  Do not use any sheets or blankets that are too big for your bed. They should not hang  down onto the floor.  Have a firm chair that has side arms. You can use this for support while you get dressed.  Do not have throw rugs and other things on the floor that can make you trip. What can I do in the kitchen?  Clean up any spills right away.  Avoid walking on wet floors.  Keep items that you use a lot in easy-to-reach places.  If you need to reach something above you, use a strong step stool that has a grab bar.  Keep electrical cords out of the way.  Do not use floor polish or wax that makes floors slippery. If you must use wax, use non-skid floor wax.  Do not have throw rugs and other things on the floor that can make you trip. What can I do with my stairs?  Do not leave any items on the stairs.  Make sure that there are handrails on both sides of the stairs and use them. Fix handrails that are broken or  loose. Make sure that handrails are as long as the stairways.  Check any carpeting to make sure that it is firmly attached to the stairs. Fix any carpet that is loose or worn.  Avoid having throw rugs at the top or bottom of the stairs. If you do have throw rugs, attach them to the floor with carpet tape.  Make sure that you have a light switch at the top of the stairs and the bottom of the stairs. If you do not have them, ask someone to add them for you. What else can I do to help prevent falls?  Wear shoes that:  Do not have high heels.  Have rubber bottoms.  Are comfortable and fit you well.  Are closed at the toe. Do not wear sandals.  If you use a stepladder:  Make sure that it is fully opened. Do not climb a closed stepladder.  Make sure that both sides of the stepladder are locked into place.  Ask someone to hold it for you, if possible.  Clearly mark and make sure that you can see:  Any grab bars or handrails.  First and last steps.  Where the edge of each step is.  Use tools that help you move around (mobility aids) if they are needed. These include:  Canes.  Walkers.  Scooters.  Crutches.  Turn on the lights when you go into a dark area. Replace any light bulbs as soon as they burn out.  Set up your furniture so you have a clear path. Avoid moving your furniture around.  If any of your floors are uneven, fix them.  If there are any pets around you, be aware of where they are.  Review your medicines with your doctor. Some medicines can make you feel dizzy. This can increase your chance of falling. Ask your doctor what other things that you can do to help prevent falls. This information is not intended to replace advice given to you by your health care provider. Make sure you discuss any questions you have with your health care provider. Document Released: 06/12/2009 Document Revised: 01/22/2016 Document Reviewed: 09/20/2014 Elsevier Interactive  Patient Education  2017 [employer].

## 2020-10-25 ENCOUNTER — Other Ambulatory Visit: Payer: Self-pay | Admitting: Family Medicine

## 2020-10-25 ENCOUNTER — Other Ambulatory Visit: Payer: Self-pay | Admitting: Internal Medicine

## 2020-10-25 DIAGNOSIS — M545 Low back pain, unspecified: Secondary | ICD-10-CM

## 2020-10-26 ENCOUNTER — Encounter: Payer: Self-pay | Admitting: Family Medicine

## 2020-10-26 NOTE — Telephone Encounter (Signed)
Requested medication (s) are due for refill today: yes  Requested medication (s) are on the active medication list: yes  Last refill:  07/17/20  Future visit scheduled: yes  Notes to clinic:  Med not delegated to NT to RF   Requested Prescriptions  Pending Prescriptions Disp Refills   traMADol (ULTRAM) 50 MG tablet [Pharmacy Med Name: traMADol HCl 50 MG Oral Tablet] 30 tablet 0    Sig: TAKE 1 TABLET BY MOUTH EVERY 6 HOURS AS NEEDED      Not Delegated - Analgesics:  Opioid Agonists Failed - 10/25/2020  6:59 PM      Failed - This refill cannot be delegated      Failed - Urine Drug Screen completed in last 360 days      Passed - Valid encounter within last 6 months    Recent Outpatient Visits           5 months ago Allergic conjunctivitis of right eye   [facility] [place], Dionne Bucy, MD   10 months ago Encounter for annual wellness visit (AWV) in Medicare patient   [place], Dionne Bucy, [place]   10 months ago Muscle spasm   [place], Clearnce Sorrel, [place]   1 year ago 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

## 2020-10-27 NOTE — Telephone Encounter (Signed)
LOV 04/2020  LORF  07/17/20  # 30 x 2 rf

## 2020-10-28 ENCOUNTER — Ambulatory Visit (INDEPENDENT_AMBULATORY_CARE_PROVIDER_SITE_OTHER): Payer: Medicare HMO

## 2020-10-28 DIAGNOSIS — I5022 Chronic systolic (congestive) heart failure: Secondary | ICD-10-CM | POA: Diagnosis not present

## 2020-10-28 DIAGNOSIS — Z95810 Presence of automatic (implantable) cardiac defibrillator: Secondary | ICD-10-CM

## 2020-10-28 NOTE — Progress Notes (Addendum)
EPIC Encounter for ICM Monitoring  Patient Name: Vanessa Carlson is a 60 y.o. female Date: 10/28/2020 Primary Care Physican: Virginia Crews, MD Primary Cardiologist: Carlis Abbott, NP HF Electrophysiologist: Caryl Comes 08/06/2020 OfficeWeight:230lbs   Transmission reviewed.  Optivol thoracic impedancetrending back toward baseline normal.  Prescribed:Furosemide40 mg2tablets (80 mg total) twice a day.   Labs: 09/30/2020 Creatinine 0.97, BUN 13, Potassium 4.5, Sodium 140, GFR >60 08/06/2020 Creatinine 0.92, BUN 10, Potassium 3.8, Sodium 138, GFR >60 03/19/2020 Creatinine 0.94, BUN 17, Potassium 4.4, Sodium 141, GFR >60 12/19/2019 Creatinine 0.97, BUN 14, Potassium 4.8, Sodium 145, GFR 64-74 09/17/2019 Creatinine 0.89, BUN 13, Potassium 3.6, Sodium 140, GFR >60 A complete set of results can be found in Results Review.  Recommendations:No changes  Follow-up plan: ICM clinic phone appointment on4/11/2020. 91 day device clinic remote transmission5/05/2021.   EP/Cardiology Office Visits:5/22/2022with Dr. Caryl Comes.   Copy of ICM check sent to [place].  3 month ICM trend: 10/20/2020.    1 Year ICM trend:       [name], RN 10/28/2020 3:35 PM

## 2020-10-29 ENCOUNTER — Other Ambulatory Visit: Payer: Self-pay

## 2020-10-29 ENCOUNTER — Ambulatory Visit (INDEPENDENT_AMBULATORY_CARE_PROVIDER_SITE_OTHER): Payer: Medicare HMO

## 2020-10-29 DIAGNOSIS — Z952 Presence of prosthetic heart valve: Secondary | ICD-10-CM

## 2020-10-29 DIAGNOSIS — I480 Paroxysmal atrial fibrillation: Secondary | ICD-10-CM | POA: Diagnosis not present

## 2020-10-29 LAB — POCT INR
INR: 2.5 (ref 2.0–3.0)
PT: 29.4

## 2020-10-29 NOTE — Patient Instructions (Signed)
Description    7.5 mg qd except 5 mg on Sat. F/u in 2 weeks

## 2020-11-04 ENCOUNTER — Ambulatory Visit: Payer: Medicare HMO | Admitting: Family Medicine

## 2020-11-08 ENCOUNTER — Other Ambulatory Visit: Payer: Self-pay | Admitting: Internal Medicine

## 2020-11-12 ENCOUNTER — Ambulatory Visit: Payer: Self-pay

## 2020-11-19 ENCOUNTER — Ambulatory Visit (INDEPENDENT_AMBULATORY_CARE_PROVIDER_SITE_OTHER): Payer: Medicare HMO

## 2020-11-19 ENCOUNTER — Other Ambulatory Visit: Payer: Self-pay

## 2020-11-19 DIAGNOSIS — Z952 Presence of prosthetic heart valve: Secondary | ICD-10-CM | POA: Diagnosis not present

## 2020-11-19 DIAGNOSIS — I480 Paroxysmal atrial fibrillation: Secondary | ICD-10-CM

## 2020-11-19 LAB — POCT INR
INR: 3.1 — AB (ref 2.0–3.0)
PT: 36.9

## 2020-11-19 MED ORDER — METOPROLOL SUCCINATE ER 25 MG PO TB24: 25.0000 mg | ORAL_TABLET | Freq: Two times a day (BID) | ORAL | 1 refills | Status: DC

## 2020-11-19 NOTE — Patient Instructions (Signed)
Description    7.5 mg qd except 5 mg on Sat. F/u in 4 weeks

## 2020-11-25 ENCOUNTER — Other Ambulatory Visit: Payer: Self-pay

## 2020-11-25 ENCOUNTER — Ambulatory Visit
Admission: RE | Admit: 2020-11-25 | Discharge: 2020-11-25 | Disposition: A | Discharge status: Home / Self Care | Payer: Medicare HMO | Source: Ambulatory Visit | Attending: Oncology | Admitting: Oncology

## 2020-11-25 DIAGNOSIS — S2241XA Multiple fractures of ribs, right side, initial encounter for closed fracture: Secondary | ICD-10-CM | POA: Diagnosis not present

## 2020-11-25 DIAGNOSIS — J8410 Pulmonary fibrosis, unspecified: Secondary | ICD-10-CM | POA: Diagnosis not present

## 2020-11-25 DIAGNOSIS — C3490 Malignant neoplasm of unspecified part of unspecified bronchus or lung: Secondary | ICD-10-CM | POA: Diagnosis not present

## 2020-11-25 DIAGNOSIS — J8489 Other specified interstitial pulmonary diseases: Secondary | ICD-10-CM | POA: Diagnosis not present

## 2020-11-25 DIAGNOSIS — Z85118 Personal history of other malignant neoplasm of bronchus and lung: Secondary | ICD-10-CM | POA: Diagnosis not present

## 2020-11-25 DIAGNOSIS — Z08 Encounter for follow-up examination after completed treatment for malignant neoplasm: Secondary | ICD-10-CM | POA: Diagnosis not present

## 2020-11-28 ENCOUNTER — Encounter: Payer: Self-pay | Admitting: Family Medicine

## 2020-11-28 ENCOUNTER — Inpatient Hospital Stay: Payer: Medicare HMO | Attending: Oncology | Admitting: Oncology

## 2020-11-28 ENCOUNTER — Telehealth: Payer: Medicare HMO | Admitting: Oncology

## 2020-11-28 DIAGNOSIS — Z85118 Personal history of other malignant neoplasm of bronchus and lung: Secondary | ICD-10-CM

## 2020-11-28 DIAGNOSIS — Z08 Encounter for follow-up examination after completed treatment for malignant neoplasm: Secondary | ICD-10-CM

## 2020-11-28 DIAGNOSIS — C3411 Malignant neoplasm of upper lobe, right bronchus or lung: Secondary | ICD-10-CM | POA: Insufficient documentation

## 2020-12-01 ENCOUNTER — Ambulatory Visit (INDEPENDENT_AMBULATORY_CARE_PROVIDER_SITE_OTHER): Payer: Medicare HMO

## 2020-12-01 DIAGNOSIS — I5022 Chronic systolic (congestive) heart failure: Secondary | ICD-10-CM

## 2020-12-01 DIAGNOSIS — Z95810 Presence of automatic (implantable) cardiac defibrillator: Secondary | ICD-10-CM | POA: Diagnosis not present

## 2020-12-02 NOTE — Progress Notes (Signed)
EPIC Encounter for ICM Monitoring  Patient Name: Vanessa Carlson is a 60 y.o. female Date: 12/02/2020 Primary Care Physican: Virginia Crews, MD Primary Cardiologist: Carlis Abbott, NP HF Electrophysiologist: Caryl Comes 09/30/2020 OfficeWeight:229lbs   Transmission reviewed.  Optivol thoracic impedancesuggesting normal fluid levels.  Prescribed:  Furosemide40 mg2tablets (80 mg total) twice a day.   Spironolactone 25 mg take 1 tablet daily  Labs: 09/30/2020 Creatinine 0.97, BUN 13, Potassium 4.5, Sodium 140, GFR >60 08/06/2020 Creatinine 0.92, BUN 10, Potassium 3.8, Sodium 138, GFR >60 03/19/2020 Creatinine 0.94, BUN 17, Potassium 4.4, Sodium 141, GFR >60 12/19/2019 Creatinine 0.97, BUN 14, Potassium 4.8, Sodium 145, GFR 64-74 09/17/2019 Creatinine 0.89, BUN 13, Potassium 3.6, Sodium 140, GFR >60 A complete set of results can be found in Results Review.  Recommendations:No changes  Follow-up plan: ICM clinic phone appointment on6/01/2021. 91 day device clinic remote transmission5/05/2021.   EP/Cardiology Office Visits:5/22/2022with Dr. Caryl Comes.   Copy of ICM check sent to [place].  3 month ICM trend: 12/01/2020.    1 Year ICM trend:       [name], RN 12/02/2020 11:24 AM

## 2020-12-04 ENCOUNTER — Other Ambulatory Visit: Payer: Self-pay | Admitting: Family Medicine

## 2020-12-04 ENCOUNTER — Telehealth: Payer: Self-pay | Admitting: *Deleted

## 2020-12-04 ENCOUNTER — Other Ambulatory Visit: Payer: Medicare HMO

## 2020-12-04 DIAGNOSIS — C3411 Malignant neoplasm of upper lobe, right bronchus or lung: Secondary | ICD-10-CM

## 2020-12-04 NOTE — Telephone Encounter (Signed)
Pt made aware of MD recommendations to proceed with CT guided biopsy of the nodular area in her right upper lobe of the lung. Pt is agreeable. Informed pt that once scheduled, she will be notified. Pt currently taking coumadin and will need clearance prior to scheduling biopsy. Clearance letter faxed to pt's PCP who manages her coumadin. Will call pt back once scheduled with all appts.

## 2020-12-04 NOTE — Progress Notes (Signed)
Tumor Board Documentation  Vanessa Carlson was presented by Dr Janese Banks at our Tumor Board on 12/04/2020, which included representatives from medical oncology,radiation oncology,internal medicine,navigation,pathology,radiology,pharmacy,genetics,research,palliative care.  Vanessa Carlson currently presents as a current patient,for discussion with history of the following treatments: active survellience,neoadjuvant radiation.  Additionally, we reviewed previous medical and familial history, history of present illness, and recent lab results along with all available histopathologic and imaging studies. The tumor board considered available treatment options and made the following recommendations: Biopsy,Additional screening (PET Scan)    The following procedures/referrals were also placed: No orders of the defined types were placed in this encounter.   Clinical Trial Status: not discussed   Staging used: AJCC Stage Group  AJCC Staging:       Group: Stage 1 LUL Lung Cancer   National site-specific guidelines NCCN were discussed with respect to the case.  Tumor board is a meeting of clinicians from various specialty areas who evaluate and discuss patients for whom a multidisciplinary approach is being considered. Final determinations in the plan of care are those of the provider(s). The responsibility for follow up of recommendations given during tumor board is that of the provider.   Today's extended care, comprehensive team conference, Vanessa Carlson was not present for the discussion and was not examined.   Multidisciplinary Tumor Board is a multidisciplinary case peer review process.  Decisions discussed in the Multidisciplinary Tumor Board reflect the opinions of the specialists present at the conference without having examined the patient.  Ultimately, treatment and diagnostic decisions rest with the primary provider(s) and the patient.

## 2020-12-04 NOTE — Telephone Encounter (Signed)
Requested medications are due for refill today.  unknown  Requested medications are on the active medications list.  no  Last refill. unknown  Future visit scheduled.   yes  Notes to clinic. Medication was d/c 09/30/2020

## 2020-12-07 NOTE — Progress Notes (Signed)
I connected with Vanessa Carlson on 12/07/20 at  2:00 PM EDT by video enabled telemedicine visit and verified that I am speaking with the correct person using two identifiers.   I discussed the limitations, risks, security and privacy concerns of performing an evaluation and management service by telemedicine and the availability of in-person appointments. I also discussed with the patient that there may be a patient responsible charge related to this service. The patient expressed understanding and agreed to proceed.  Other persons participating in the visit and their role in the encounter:  none  Patient's location:  home Provider's location:  work   Diagnosis- stage I lung cancer s/p SBRT   Chief complaint/ Reason for visit-discuss CT scan results and further management   Heme/Onc history: Patient is a 60 year old female with a past medical history significant for CAD, ischemic cardiomyopathy status post AICD placement, rheumatic heart disease status post mechanical mitral valve replacement on Coumadin.She was found to have a right upper lobe lung mass 2.5 cm that was hypermetabolic on PET CT scan in January 2019. No evidence of nodal involvement. She was not deemed to be a candidate for surgery and underwent SBRT to this lesion. This was biopsy-proven adenocarcinoma with acinar and solid morphologies. She then had a CT scan in January 2020 which showed enlarging satellite right upper lobe lung nodules. This was followed by a PET CT scan which showed right upper lobe lung nodules 2 of them were hypermetabolic with an SUV of 8.7 was also a thyroid nodule approximately 4 mm in size which was not definitely hypermetabolic. Patient was seen by Dr. Donella Stade following this andsh receivedSBRT for 5 fractions encompassing all these 3 nodules. Patient has not had any systemic chemotherapy for her lung cancer so far  Interval history Patient denies any cough or sob.    Review of Systems   Constitutional: Positive for malaise/fatigue. Negative for chills, fever and weight loss.  HENT: Negative for congestion, ear discharge and nosebleeds.   Eyes: Negative for blurred vision.  Respiratory: Negative for cough, hemoptysis, sputum production, shortness of breath and wheezing.   Cardiovascular: Negative for chest pain, palpitations, orthopnea and claudication.  Gastrointestinal: Negative for abdominal pain, blood in stool, constipation, diarrhea, heartburn, melena, nausea and vomiting.  Genitourinary: Negative for dysuria, flank pain, frequency, hematuria and urgency.  Musculoskeletal: Negative for back pain, joint pain and myalgias.  Skin: Negative for rash.  Neurological: Negative for dizziness, tingling, focal weakness, seizures, weakness and headaches.  Endo/Heme/Allergies: Does not bruise/bleed easily.  Psychiatric/Behavioral: Negative for depression and suicidal ideas. The patient does not have insomnia.     No Known Allergies  Past Medical History:  Diagnosis Date  . AICD (automatic cardioverter/defibrillator) present   . Anterior myocardial infarction [facility])    a. 10/2014 - occluded LAD, complicated by cardiogenic shock-->Med Rx as interventional team was unable to open LAD.  [name] Cancer of upper lobe of right lung ([place]) 09/2017   radiation therapy right side  . Cervical cancer ([place])    in remission for ~20 yrs, s/p hysterectomy  . Chronic combined systolic and diastolic CHF (congestive heart failure) ([place])    a. 10/2015 Echo: EF 20-25%, sev diast dysfxn, mildly reduced RV fxn. Nl MV prosthesis; b. 12/2017 Echo: EF 20-25%, diff HK. Sev ant/antsept HK, apical AK. Mild MS (mean graad 47mmHg). Mod TR. PASP 89mmHg.  [name] Coronary artery disease    a. 10/2014 Ant STEMI/Cath: LAD 100p ->Med managed as lesion could not be crossed-->complicated by CGS  and post-MI pericarditis ([place]).  . Essential hypertension   . Ischemic cardiomyopathy    a. 03/2015 s/p MDT single lead AICD;  b. 10/2015  Echo: EF 20-25%;  c. 12/2017 Echo: EF 20-25%, diff HK.  . Mass of upper lobe of right lung    a. noted on CXR & CT 08/2017 w/ abnl PET CT.  [name] Mitral valve disease    a. 2006 s/p MVR w/ 71mm SJM bileaflet mechanical valve-->chronic coumadin/ASA; b. 12/2017 Echo: Mild MS. Mean gradient 53mmHg.  [name] Personal history of radiation therapy    f/u lung ca    Past Surgical History:  Procedure Laterality Date  . APPENDECTOMY  1998  . BREAST EXCISIONAL BIOPSY Left 80s   benign  . CARDIAC CATHETERIZATION    . CARDIAC DEFIBRILLATOR PLACEMENT    . COLONOSCOPY WITH PROPOFOL N/A 01/11/2020   Procedure: COLONOSCOPY WITH PROPOFOL;  Surgeon: Jonathon Bellows, MD;  Location[facility] ENDOSCOPY;  Service: Gastroenterology;  Laterality: N/A;  . RADICAL HYSTERECTOMY  1998  . SHOULDER SURGERY Bilateral    rotator cuff tears  . VALVE REPLACEMENT     Mitral valve; 27 mm St. Jude bileaflet valve    Social History   Socioeconomic History  . Marital status: Married    Spouse name: Tosin  . Number of children: 2  . Years of education: 14  . Highest education level: Some college, no degree  Occupational History  . Occupation: disable  Tobacco Use  . Smoking status: Former Smoker    Packs/day: 1.00    Years: 4.00    Pack years: 4.00    Types: Cigarettes    Quit date: 1998    Years since quitting: 24.2  . Smokeless tobacco: Never Used  Vaping Use  . Vaping Use: Never used  Substance and Sexual Activity  . Alcohol use: No  . Drug use: No  . Sexual activity: Yes    Partners: Male    Birth control/protection: Surgical  Other Topics Concern  . Not on file  Social History Narrative  . Not on file   Social Determinants of Health   Financial Resource Strain: Low Risk   . Difficulty of Paying Living Expenses: Not hard at all  Food Insecurity: No Food Insecurity  . Worried About Charity fundraiser in the Last Year: Never true  . Ran Out of Food in the Last Year: Never true  Transportation Needs: No  Transportation Needs  . Lack of Transportation (Medical): No  . Lack of Transportation (Non-Medical): No  Physical Activity: Inactive  . Days of Exercise per Week: 0 days  . Minutes of Exercise per Session: 0 min  Stress: No Stress Concern Present  . Feeling of Stress : Not at all  Social Connections: Socially Integrated  . Frequency of Communication with Friends and Family: More than three times a week  . Frequency of Social Gatherings with Friends and Family: More than three times a week  . Attends Religious Services: More than 4 times per year  . Active Member of Clubs or Organizations: Yes  . Attends Archivist Meetings: More than 4 times per year  . Marital Status: Married  Human resources officer Violence: Not At Risk  . Fear of Current or Ex-Partner: No  . Emotionally Abused: No  . Physically Abused: No  . Sexually Abused: No    Family History  Problem Relation Age of Onset  . Heart attack Mother   . Hypertension Mother   . Diabetes Mother   .  Emphysema Father        smoker  . Diabetes Maternal Grandmother   . Kidney disease Maternal Grandmother   . Breast cancer Maternal Aunt   . Breast cancer Maternal Aunt   . Colon cancer Maternal Uncle      Current Outpatient Medications:  .  traMADol (ULTRAM) 50 MG tablet, TAKE 1 TABLET BY MOUTH EVERY 6 HOURS AS NEEDED, Disp: 30 tablet, Rfl: 2 .  acetaminophen (TYLENOL) 325 MG tablet, Take 650 mg by mouth every 6 (six) hours as needed., Disp: , Rfl:  .  aspirin 81 MG chewable tablet, Chew 81 mg by mouth daily., Disp: , Rfl:  .  atorvastatin (LIPITOR) 80 MG tablet, Take 1 tablet by mouth once daily, Disp: 90 tablet, Rfl: 1 .  cyclobenzaprine (FLEXERIL) 5 MG tablet, Take 1 tablet (5 mg total) by mouth 3 (three) times daily as needed for muscle spasms., Disp: 30 tablet, Rfl: 1 .  fluticasone (FLONASE) 50 MCG/ACT nasal spray, Place 2 sprays into both nostrils as needed for allergies or rhinitis., Disp: , Rfl:  .  furosemide  (LASIX) 40 MG tablet, Take 2 tablets by mouth twice daily, Disp: 360 tablet, Rfl: 0 .  gabapentin (NEURONTIN) 300 MG capsule, Take 1 capsule (300 mg total) by mouth 2 (two) times daily., Disp: 180 capsule, Rfl: 3 .  metoprolol succinate (TOPROL-XL) 25 MG 24 hr tablet, Take 1 tablet (25 mg total) by mouth in the morning and at bedtime. Take with or immediately following a meal., Disp: 180 tablet, Rfl: 1 .  nitroGLYCERIN (NITROSTAT) 0.3 MG SL tablet, Place 1 tablet (0.3 mg total) under the tongue every 5 (five) minutes as needed for chest pain., Disp: 30 tablet, Rfl: 3 .  sacubitril-valsartan (ENTRESTO) 24-26 MG, Take 1 tablet by mouth 2 (two) times daily., Disp: 180 tablet, Rfl: 3 .  spironolactone (ALDACTONE) 25 MG tablet, Take 1 tablet by mouth once daily, Disp: 90 tablet, Rfl: 0 .  traZODone (DESYREL) 50 MG tablet, Take 0.5-1 tablets (25-50 mg total) by mouth at bedtime as needed for sleep., Disp: 30 tablet, Rfl: 3 .  warfarin (COUMADIN) 10 MG tablet, Take 1 tablet (10 mg total) by mouth as directed., Disp: 30 tablet, Rfl: 3 .  warfarin (COUMADIN) 7.5 MG tablet, Take 1 tablet (7.5 mg total) by mouth daily., Disp: 90 tablet, Rfl: 3  CT Chest Wo Contrast  Result Date: 11/25/2020 CLINICAL DATA:  Restaging lung cancer. EXAM: CT CHEST WITHOUT CONTRAST TECHNIQUE: Multidetector CT imaging of the chest was performed following the standard protocol without IV contrast. COMPARISON:  09/25/2020 FINDINGS: Cardiovascular: Previous median sternotomy and CABG procedure. There is a left chest wall implantable ICD with lead in the right ventricle. Mild cardiac enlargement. No pericardial effusion. Mediastinum/Nodes: Normal appearance of the thyroid gland. The trachea appears patent and is midline. Normal appearance of the esophagus. No mediastinal adenopathy. Lungs/Pleura: Bandlike area of masslike architectural distortion and fibrosis is again noted within the right upper lobe. There is associated volume loss. The  nodular density reference on previous exam measures 1.4 x 0.8 cm, image 38/3. Unchanged from previous exam. Just below this a second nodular component measures 1.2 cm, image 42/3. Also unchanged. Peripheral and basilar predominant interstitial reticulation is again noted within the lung bases. No new suspicious nodule or mass identified at this time. Upper Abdomen: No acute findings within the imaged portions of the upper abdomen. Musculoskeletal: No acute or suspicious osseous findings. Chronic right second and third rib fracture deformities are  again noted and appear similar. No acute or suspicious osseous findings. IMPRESSION: 1. Stable appearance of masslike architectural distortion and fibrosis within the right upper lobe compatible post treatment changes. 2. There are 2 nodular areas associated with the right upper lobe bandlike area of radiation change. Since 09/25/2020 the size of these nodules are not significantly changed in the interval. However, there is been clear interval increase in these nodular components when compared with 03/19/2020. As mention on the previous exam locally recurrent tumor cannot be excluded. 3. Similar appearance of peripheral and basilar predominant interstitial reticulation within the lung bases which may reflect underlying interstitial lung disease. 4. Aortic atherosclerosis. Aortic Atherosclerosis (ICD10-I70.0). Electronically Signed   By: Kerby Moors M.D.   On: 11/25/2020 15:31    No images are attached to the encounter.   CMP Latest Ref Rng & Units 09/30/2020  Glucose 70 - 99 mg/dL 161(H)  BUN 6 - 20 mg/dL 13  Creatinine 0.44 - 1.00 mg/dL 0.97  Sodium 135 - 145 mmol/L 140  Potassium 3.5 - 5.1 mmol/L 4.5  Chloride 98 - 111 mmol/L 103  CO2 22 - 32 mmol/L 29  Calcium 8.9 - 10.3 mg/dL 9.0  Total Protein 6.5 - 8.1 g/dL 7.5  Total Bilirubin 0.3 - 1.2 mg/dL 1.2  Alkaline Phos 38 - 126 U/L 67  AST 15 - 41 U/L 24  ALT 0 - 44 U/L 17   CBC Latest Ref Rng & Units  09/30/2020  WBC 4.0 - 10.5 K/uL 5.7  Hemoglobin 12.0 - 15.0 g/dL 12.7  Hematocrit 36.0 - 46.0 % 38.9  Platelets 150 - 400 K/uL 155     Observation/objective:appears in no acute distress over video visit today. Breathing is no labored   Assessment and plan: Patient is a 60 year old female with a history of stage I lung cancer s/p SBRT here to discuss CT scan results and further management   I have reviewed CT chest images independently and discussed findings with the patient.  Patient has had 2 rounds of radiation for lung nodules in the past which has been biopsy-proven adenocarcinoma.  Present CT scan shows stable architectural distortion and radiation fibrosis.  However within those areas there are 2 nodular areas which have increased since July 2021 but stable as compared to January 2022.  I will discuss her case at tumor board to decide if these nodules need biopsy versus PET scan or if they can be followed conservatively.  We will call the patient with the consensus from tumor board for further management  Follow-up instructions:as above  I discussed the assessment and treatment plan with the patient. The patient was provided an opportunity to ask questions and all were answered. The patient agreed with the plan and demonstrated an understanding of the instructions.   The patient was advised to call back or seek an in-person evaluation if the symptoms worsen or if the condition fails to improve as anticipated.    Visit Diagnosis: 1. Encounter for follow-up surveillance of lung cancer     Dr. Randa Evens, MD, MPH [facility] Tel- [phone] 12/07/2020 12:44 PM

## 2020-12-09 ENCOUNTER — Encounter: Payer: Self-pay | Admitting: Oncology

## 2020-12-11 ENCOUNTER — Other Ambulatory Visit: Payer: Self-pay | Admitting: Family Medicine

## 2020-12-11 NOTE — Telephone Encounter (Signed)
Courtesy refill, pt has appt 12/17/20

## 2020-12-12 ENCOUNTER — Telehealth: Payer: Self-pay | Admitting: Internal Medicine

## 2020-12-12 NOTE — Telephone Encounter (Signed)
Please comment on warfarin. Thanks!

## 2020-12-12 NOTE — Telephone Encounter (Signed)
   [facility] Pre-operative Risk Assessment    Patient Name: Vanessa Carlson  DOB: [birth year]  MRN: [id number]   HEARTCARE STAFF: - Please ensure there is not already an duplicate clearance open for this procedure. - Under Visit Info/Reason for Call, type in Other and utilize the format Clearance MM/DD/YY or Clearance TBD. Do not use dashes or single digits. - If request is for dental extraction, please clarify the # of teeth to be extracted.  Request for surgical clearance:  1. What type of surgery is being performed? Lung biopsy  2. When is this surgery scheduled? TBD  3. What type of clearance is required (medical clearance vs. Pharmacy clearance to hold med vs. Both)? both  4. Are there any medications that need to be held prior to surgery and how long? Coumadin 5 days prior to procedure and restart the day after procedure   5. Practice name and name of physician performing surgery? [facility]   6. What is the office phone number? [phone]   7.   What is the office fax number? [phone]  8.   Anesthesia type (None, local, MAC, general) ? Not listed    Ace Gins 12/12/2020, 11:01 AM  _________________________________________________________________   (provider comments below)

## 2020-12-12 NOTE — Telephone Encounter (Addendum)
Patient with diagnosis of mechanical MVR on warfarin for anticoagulation.    Procedure: lung biopsy Date of procedure: TBD  CrCl 40mL/min using adjusted body weight due to morbid obesity Platelet count 155K  Per office protocol, patient can hold warfarin for 5 days prior to procedure.   Patient will need bridging with Lovenox (enoxaparin) around procedure. We do not manage patient's warfarin. Lovenox bridge should be coordinated by her managing physician (PCP).

## 2020-12-15 ENCOUNTER — Telehealth: Payer: Self-pay | Admitting: Family Medicine

## 2020-12-15 ENCOUNTER — Telehealth: Payer: Self-pay | Admitting: *Deleted

## 2020-12-15 ENCOUNTER — Telehealth: Payer: Self-pay

## 2020-12-15 NOTE — Telephone Encounter (Signed)
Left message for the patient to call back and speak to the on-call preop APP yesterday

## 2020-12-15 NOTE — Telephone Encounter (Signed)
Pt returning call

## 2020-12-15 NOTE — Telephone Encounter (Signed)
Cancer center calling to advise the cardiologist wants her pcp to prescribe the Lovnox bridge for the pt.  Need to put the order in and teach the pt what to do.   This needs to be done 5 days prior to the lung biopsy  being scheduled.  Please call Judeen Hammans at the cancer center Desk phone  [phone] Cell [phone]  See cardiologist note with questions.

## 2020-12-15 NOTE — Telephone Encounter (Signed)
-----   Message from Sula Rumple sent at 12/15/2020  2:17 PM EDT ----- Regarding: FW: Clearance to hold coumadin Can one of you follow up on this for me?  ----- Message ----- From: Virginia Crews, MD Sent: 12/12/2020   2:23 PM EDT To: Sula Rumple, [facility] Subject: FW: Clearance to hold coumadin                 This needs to be handled by someone in the office. Please make sure that forms/faxes are being managed as well. Thanks ----- Message ----- From: Telford Nab, RN Sent: 12/08/2020   1:28 PM EDT To: Virginia Crews, MD Subject: Clearance to hold coumadin                     Hello,  I faxed a clearance letter regarding this patient last week. She needs a lung biopsy to rule out lung cancer recurrence. We cannot schedule her biopsy until we get clearance to hold her coumadin.  Let me know if I need to refax the letter.   Thank you, Hayley

## 2020-12-15 NOTE — Telephone Encounter (Signed)
Called PCPs office today.  Got clearance from cardiac to be off of Coumadin for 5 days and do Lovenox bridge.  The cardiology office says the PCP should be doing the Lovenox bridge.  So I called and spoke to [facility] there at the PCPs office to let her know that the cardiologist said that the PCP needs to do the Lovenox bridging and teaching, and ordering.  Once this is done then I can get an appointment for the patient to get a biopsy so we will know when the date should start.  They do have someone at the office call me back

## 2020-12-15 NOTE — Telephone Encounter (Signed)
Another message had come in on this patient.  They were asking for clearance for her to have Lovenox bridging.  Due to the complexity of this patient, Dr Rosanna Randy has recommended to the surgeon's office that they get clearance from her cardiologist.

## 2020-12-15 NOTE — Telephone Encounter (Signed)
Please advise 

## 2020-12-16 NOTE — Telephone Encounter (Signed)
   Name: Vanessa Carlson  DOB: 04/22/1961  MRN: [id number]   Primary Cardiologist: [name], MD  Chart reviewed as part of pre-operative protocol coverage. Patient was contacted 12/16/2020 in reference to pre-operative risk assessment for pending surgery as outlined below.  Vanessa Carlson was last seen on 08/06/2020 by Dr. Saunders Revel.  Since that day, Vanessa Carlson has done well without any exertional chest pain or worsening dyspnea.  She is able to accomplish more than 4 METS of activity without any issue.  Therefore, based on ACC/AHA guidelines, the patient would be at acceptable risk for the planned procedure without further cardiovascular testing.   Once the date of the procedure has been set up, she will need to see her PCP's Coumadin clinic at least 5 to 6 days or earlier before the date of the procedure in order to be set up with Lovenox bridging during perioperative phase.  The patient was advised that if she develops new symptoms prior to surgery to contact our office to arrange for a follow-up visit, and she verbalized understanding.  I will route this recommendation to the requesting party via Epic fax function and remove from pre-op pool. Please call with questions.  [place], [place] 12/16/2020, 12:22 PM

## 2020-12-17 ENCOUNTER — Ambulatory Visit (INDEPENDENT_AMBULATORY_CARE_PROVIDER_SITE_OTHER): Payer: Medicare HMO

## 2020-12-17 ENCOUNTER — Other Ambulatory Visit: Payer: Self-pay

## 2020-12-17 ENCOUNTER — Encounter: Payer: Self-pay | Admitting: Family Medicine

## 2020-12-17 ENCOUNTER — Telehealth: Payer: Self-pay | Admitting: Family Medicine

## 2020-12-17 ENCOUNTER — Encounter: Payer: Self-pay | Admitting: Oncology

## 2020-12-17 DIAGNOSIS — I480 Paroxysmal atrial fibrillation: Secondary | ICD-10-CM | POA: Diagnosis not present

## 2020-12-17 DIAGNOSIS — Z952 Presence of prosthetic heart valve: Secondary | ICD-10-CM

## 2020-12-17 LAB — POCT INR
INR: 2.1 (ref 2.0–3.0)
PT: 24.8

## 2020-12-17 MED ORDER — ENOXAPARIN SODIUM 100 MG/ML ~~LOC~~ SOLN: 100.0000 mg | Freq: Two times a day (BID) | SUBCUTANEOUS | 0 refills | Status: DC

## 2020-12-17 NOTE — Telephone Encounter (Signed)
Defer to cardiology for this patient with multiple risk factors

## 2020-12-17 NOTE — Telephone Encounter (Signed)
Vanessa Carlson with dr Janese Banks office is calling and they received the ok from cardiologist for pt to come off coumadin however dr Janese Banks office needs dr Brita Romp to do lovenox bridging. Pt biopsy is 12-23-2020. Vanessa Carlson can be reach at [phone] or her cell [phone]

## 2020-12-17 NOTE — Telephone Encounter (Signed)
Called cardiology, and since PCP manages pt's coumadin, we have to do Lovenox bridging.

## 2020-12-17 NOTE — Telephone Encounter (Addendum)
Please advise patient that she needs to stop warfarin 5 days before lung biopsy, so she should not take anymore since the surgery is scheduled for 12-23-2020. She needs to start Lovenox 100mg  SQ every 12 hours starting 3 days before surgery, which is Saturday morning 4/23. Prescription has been sent to [facility] Last dose should be NO LATER than 24 hours before surgery (before 8:30am 4/25) Restart Lovenox 48 hours after procedure (the morning of 12/25/2020) and continue until PT/INR is above 2.0.

## 2020-12-18 ENCOUNTER — Telehealth: Payer: Self-pay | Admitting: *Deleted

## 2020-12-18 NOTE — Telephone Encounter (Signed)
Patient advised.

## 2020-12-18 NOTE — Telephone Encounter (Signed)
LMTCB-PATIENT WILL NEED TO SPEAK TO OFFICE

## 2020-12-18 NOTE — Telephone Encounter (Signed)
Called pt to check with her that she got the message that dr Caryn Section called in lovenox and that pt has done this before and her husband gives her the injections. She has all the instructions for coumadin and lovenox . She told me that vicki just got off the phone and went over all the instructions. She does need  covid test and they wanted her to have it Friday and she would rather have it Monday and I checked and it is ok and she would need to be at medical art building at [facility] next Monday and be there between 8 am and 9:30. She has been there before and knows where to go.

## 2020-12-18 NOTE — Progress Notes (Signed)
Patient on schedule for Lung Biopsy, called and spoke with patient on phone with pre procedure instructions given. Made aware to be here @ 0730, NPO after [place] prior to procedure and driver post procedure/discharge. Also holding Coumadin since 4/21, and on lovenox bridge, holding 4/25 dose. To also get COVID test. Stated understanding.

## 2020-12-21 ENCOUNTER — Encounter: Payer: Self-pay | Admitting: Family Medicine

## 2020-12-22 ENCOUNTER — Other Ambulatory Visit: Payer: Self-pay

## 2020-12-22 ENCOUNTER — Other Ambulatory Visit
Admission: RE | Admit: 2020-12-22 | Discharge: 2020-12-22 | Disposition: A | Discharge status: Home / Self Care | Payer: Medicare HMO | Source: Ambulatory Visit | Attending: Oncology | Admitting: Oncology

## 2020-12-22 ENCOUNTER — Other Ambulatory Visit: Payer: Self-pay | Admitting: Radiology

## 2020-12-22 DIAGNOSIS — Z01812 Encounter for preprocedural laboratory examination: Secondary | ICD-10-CM | POA: Diagnosis not present

## 2020-12-22 DIAGNOSIS — Z20822 Contact with and (suspected) exposure to covid-19: Secondary | ICD-10-CM | POA: Diagnosis not present

## 2020-12-22 LAB — SARS CORONAVIRUS 2 (TAT 6-24 HRS): SARS Coronavirus 2: NEGATIVE

## 2020-12-22 NOTE — H&P (Signed)
Chief Complaint: Patient was seen in consultation today for lung nodule biopsy   Referring Physician(s): Sindy Guadeloupe  Supervising Physician: Arne Cleveland  Patient Status: [facility] - Out-pt  History of Present Illness: Vanessa Carlson is a 60 y.o. female with a medical history significant for CAD, ischemic cardiomyopathy (AICD), rheumatic heart disease s/p mitral valve replacement (Coumadin) and right upper lobe lung cancer, diagnosed January 2019. She underwent radiation to this area. She had a restaging CT in March.  CT chest w/o contrast 11/25/20 Lungs/Pleura: Bandlike area of masslike architectural distortion and fibrosis is again noted within the right upper lobe. There is associated volume loss. The nodular density reference on previous exam measures 1.4 x 0.8 cm, image 38/3. Unchanged from previous exam. Just below this a second nodular component measures 1.2 cm, image 42/3. Also unchanged. IMPRESSION: 1. Stable appearance of masslike architectural distortion and fibrosis within the right upper lobe compatible post treatment changes. 2. There are 2 nodular areas associated with the right upper lobe bandlike area of radiation change. Since 09/25/2020 the size of these nodules are not significantly changed in the interval. However, there is been clear interval increase in these nodular components when compared with 03/19/2020. As mention on the previous exam locally recurrent tumor cannot be excluded. 3. Similar appearance of peripheral and basilar predominant interstitial reticulation within the lung bases which may reflect underlying interstitial lung disease. 4. Aortic atherosclerosis.  Interventional Radiology has been asked to evaluate this patient for a right upper lobe nodule biopsy for further work up. Imaging has been reviewed and procedure approved by Dr. Earleen Newport.   Past Medical History:  Diagnosis Date  . AICD (automatic cardioverter/defibrillator)  present   . Anterior myocardial infarction [facility])    a. 10/2014 - occluded LAD, complicated by cardiogenic shock-->Med Rx as interventional team was unable to open LAD.  [name] Cancer of upper lobe of right lung ([place]) 09/2017   radiation therapy right side  . Cervical cancer ([place])    in remission for ~20 yrs, s/p hysterectomy  . Chronic combined systolic and diastolic CHF (congestive heart failure) ([place])    a. 10/2015 Echo: EF 20-25%, sev diast dysfxn, mildly reduced RV fxn. Nl MV prosthesis; b. 12/2017 Echo: EF 20-25%, diff HK. Sev ant/antsept HK, apical AK. Mild MS (mean graad 98mmHg). Mod TR. PASP 29mmHg.  [name] Coronary artery disease    a. 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  . Essential hypertension   . Ischemic cardiomyopathy    a. 03/2015 s/p MDT single lead AICD;  b. 10/2015 Echo: EF 20-25%;  c. 12/2017 Echo: EF 20-25%, diff HK.  . Mass of upper lobe of right lung    a. noted on CXR & CT 08/2017 w/ abnl PET CT.  [name] Mitral valve disease    a. 2006 s/p MVR w/ 28mm SJM bileaflet mechanical valve-->chronic coumadin/ASA; b. 12/2017 Echo: Mild MS. Mean gradient 19mmHg.  [name] Personal history of radiation therapy    f/u lung ca    Past Surgical History:  Procedure Laterality Date  . APPENDECTOMY  1998  . BREAST EXCISIONAL BIOPSY Left 80s   benign  . CARDIAC CATHETERIZATION    . CARDIAC DEFIBRILLATOR PLACEMENT    . COLONOSCOPY WITH PROPOFOL N/A 01/11/2020   Procedure: COLONOSCOPY WITH PROPOFOL;  Surgeon: Jonathon Bellows, MD;  Location[facility] ENDOSCOPY;  Service: Gastroenterology;  Laterality: N/A;  . RADICAL HYSTERECTOMY  1998  .  SHOULDER SURGERY Bilateral    rotator cuff tears  . VALVE REPLACEMENT     Mitral valve; 27 mm [employer] Jude bileaflet valve    Allergies: Patient has no known allergies.  Medications: Prior to Admission medications   Medication Sig Start Date End Date Taking? Authorizing Provider   traMADol (ULTRAM) 50 MG tablet TAKE 1 TABLET BY MOUTH EVERY 6 HOURS AS NEEDED 10/27/20   Virginia Crews, MD  acetaminophen (TYLENOL) 325 MG tablet Take 650 mg by mouth every 6 (six) hours as needed.    [provider]  aspirin 81 MG chewable tablet Chew 81 mg by mouth daily.    [provider]  atorvastatin (LIPITOR) 80 MG tablet Take 1 tablet by mouth once daily 06/13/20   Bacigalupo, Dionne Bucy, MD  cyclobenzaprine (FLEXERIL) 5 MG tablet Take 1 tablet (5 mg total) by mouth 3 (three) times daily as needed for muscle spasms. 12/07/19   Mar Daring, PA-C  enoxaparin (LOVENOX) 100 MG/ML injection Inject 1 mL (100 mg total) into the skin every 12 (twelve) hours. Start 3 days before surgery. Take last dose 24 hours before surgery. Resume 48 hours after surgery. 12/17/20   Birdie Sons, MD  fluticasone (FLONASE) 50 MCG/ACT nasal spray Place 2 sprays into both nostrils as needed for allergies or rhinitis.    [provider]  furosemide (LASIX) 40 MG tablet Take 2 tablets by mouth twice daily 10/27/20   End, Harrell Gave, MD  gabapentin (NEURONTIN) 300 MG capsule Take 1 capsule (300 mg total) by mouth 2 (two) times daily. 04/28/20   Mar Daring, PA-C  metoprolol succinate (TOPROL-XL) 25 MG 24 hr tablet Take 1 tablet (25 mg total) by mouth in the morning and at bedtime. Take with or immediately following a meal. 11/19/20   End, Harrell Gave, MD  nitroGLYCERIN (NITROSTAT) 0.3 MG SL tablet Place 1 tablet (0.3 mg total) under the tongue every 5 (five) minutes as needed for chest pain. 12/28/18   Alisa Graff, FNP  sacubitril-valsartan (ENTRESTO) 24-26 MG Take 1 tablet by mouth 2 (two) times daily. 02/07/20   End, Harrell Gave, MD  spironolactone (ALDACTONE) 25 MG tablet Take 1 tablet by mouth once daily 12/11/20   Virginia Crews, MD  traZODone (DESYREL) 50 MG tablet Take 0.5-1 tablets (25-50 mg total) by mouth at bedtime as needed for sleep. 12/07/17   Virginia Crews, MD  warfarin (COUMADIN) 10 MG tablet Take 1 tablet (10 mg total) by mouth as directed. 03/14/20   Birdie Sons, MD  warfarin (COUMADIN) 7.5 MG tablet Take 1 tablet (7.5 mg total) by mouth daily. 04/07/20   Virginia Crews, MD     Family History  Problem Relation Age of Onset  . Heart attack Mother   . Hypertension Mother   . Diabetes Mother   . Emphysema Father        smoker  . Diabetes Maternal Grandmother   . Kidney disease Maternal Grandmother   . Breast cancer Maternal Aunt   . Breast cancer Maternal Aunt   . Colon cancer Maternal Uncle     Social History   Socioeconomic History  . Marital status: Married    Spouse name: Tosin  . Number of children: 2  . Years of education: 54  . Highest education level: Some college, no degree  Occupational History  . Occupation: disable  Tobacco Use  . Smoking status: Former Smoker    Packs/day: 1.00    Years: 4.00  Pack years: 4.00    Types: Cigarettes    Quit date: 1998    Years since quitting: 24.3  . Smokeless tobacco: Never Used  Vaping Use  . Vaping Use: Never used  Substance and Sexual Activity  . Alcohol use: No  . Drug use: No  . Sexual activity: Yes    Partners: Male    Birth control/protection: Surgical  Other Topics Concern  . Not on file  Social History Narrative  . Not on file   Social Determinants of Health   Financial Resource Strain: Low Risk   . Difficulty of Paying Living Expenses: Not hard at all  Food Insecurity: No Food Insecurity  . Worried About Charity fundraiser in the Last Year: Never true  . Ran Out of Food in the Last Year: Never true  Transportation Needs: No Transportation Needs  . Lack of Transportation (Medical): No  . Lack of Transportation (Non-Medical): No  Physical Activity: Inactive  . Days of Exercise per Week: 0 days  . Minutes of Exercise per Session: 0 min  Stress: No Stress Concern Present  . Feeling of Stress : Not at all  Social Connections:  Socially Integrated  . Frequency of Communication with Friends and Family: More than three times a week  . Frequency of Social Gatherings with Friends and Family: More than three times a week  . Attends Religious Services: More than 4 times per year  . Active Member of Clubs or Organizations: Yes  . Attends Archivist Meetings: More than 4 times per year  . Marital Status: Married    Review of Systems: A 12 point ROS discussed and pertinent positives are indicated in the HPI above.  All other systems are negative.  Review of Systems  Constitutional: Negative for appetite change and fatigue.  Respiratory: Negative for cough and shortness of breath.   Cardiovascular: Negative for chest pain and leg swelling.  Gastrointestinal: Negative for abdominal pain, diarrhea, nausea and vomiting.  Neurological: Negative for dizziness and headaches.    Vital Signs: BP 127/75   Pulse 77   Temp 98.4 F (36.9 C) (Oral)   Resp 18   Ht 5' 2.5" (1.588 m)   Wt 220 lb (99.8 kg)   LMP  (LMP Unknown)   SpO2 98%   BMI 39.60 kg/m   Physical Exam Constitutional:      General: She is not in acute distress. HENT:     Mouth/Throat:     Mouth: Mucous membranes are moist.     Pharynx: Oropharynx is clear.     Comments: All upper teeth missing Cardiovascular:     Rate and Rhythm: Normal rate and regular rhythm.     Pulses: Normal pulses.     Comments: Mechanical valve Pulmonary:     Effort: Pulmonary effort is normal.     Breath sounds: Normal breath sounds.  Abdominal:     General: Bowel sounds are normal.     Palpations: Abdomen is soft.     Tenderness: There is no abdominal tenderness.  Musculoskeletal:        General: Normal range of motion.     Cervical back: Normal range of motion.     Right lower leg: No edema.     Left lower leg: No edema.  Skin:    General: Skin is warm and dry.  Neurological:     Mental Status: She is alert and oriented to person, place, and time.  Imaging: CT Chest Wo Contrast  Result Date: 11/25/2020 CLINICAL DATA:  Restaging lung cancer. EXAM: CT CHEST WITHOUT CONTRAST TECHNIQUE: Multidetector CT imaging of the chest was performed following the standard protocol without IV contrast. COMPARISON:  09/25/2020 FINDINGS: Cardiovascular: Previous median sternotomy and CABG procedure. There is a left chest wall implantable ICD with lead in the right ventricle. Mild cardiac enlargement. No pericardial effusion. Mediastinum/Nodes: Normal appearance of the thyroid gland. The trachea appears patent and is midline. Normal appearance of the esophagus. No mediastinal adenopathy. Lungs/Pleura: Bandlike area of masslike architectural distortion and fibrosis is again noted within the right upper lobe. There is associated volume loss. The nodular density reference on previous exam measures 1.4 x 0.8 cm, image 38/3. Unchanged from previous exam. Just below this a second nodular component measures 1.2 cm, image 42/3. Also unchanged. Peripheral and basilar predominant interstitial reticulation is again noted within the lung bases. No new suspicious nodule or mass identified at this time. Upper Abdomen: No acute findings within the imaged portions of the upper abdomen. Musculoskeletal: No acute or suspicious osseous findings. Chronic right second and third rib fracture deformities are again noted and appear similar. No acute or suspicious osseous findings. IMPRESSION: 1. Stable appearance of masslike architectural distortion and fibrosis within the right upper lobe compatible post treatment changes. 2. There are 2 nodular areas associated with the right upper lobe bandlike area of radiation change. Since 09/25/2020 the size of these nodules are not significantly changed in the interval. However, there is been clear interval increase in these nodular components when compared with 03/19/2020. As mention on the previous exam locally recurrent tumor cannot be excluded. 3.  Similar appearance of peripheral and basilar predominant interstitial reticulation within the lung bases which may reflect underlying interstitial lung disease. 4. Aortic atherosclerosis. Aortic Atherosclerosis (ICD10-I70.0). Electronically Signed   By: Kerby Moors M.D.   On: 11/25/2020 15:31    Labs:  CBC: Recent Labs    03/19/20 0825 09/30/20 0923  WBC 5.6 5.7  HGB 12.5 12.7  HCT 38.3 38.9  PLT 168 155    COAGS: Recent Labs    10/15/20 0938 10/29/20 0927 11/19/20 0935 12/17/20 0937  INR 3.9* 2.5 3.1* 2.1    BMP: Recent Labs    03/19/20 0825 08/06/20 1707 09/30/20 0923  NA 141 138 140  K 4.4 3.8 4.5  CL 104 101 103  CO2 29 29 29   GLUCOSE 137* 108* 161*  BUN 17 10 13   CALCIUM 9.1 9.2 9.0  CREATININE 0.94 0.92 0.97  GFRNONAA >60 >60 >60  GFRAA >60  --   --     LIVER FUNCTION TESTS: Recent Labs    03/19/20 0825 09/30/20 0923  BILITOT 1.2 1.2  AST 27 24  ALT 24 17  ALKPHOS 80 67  PROT 8.0 7.5  ALBUMIN 4.1 4.0    TUMOR MARKERS: No results for input(s): AFPTM, CEA, CA199, CHROMGRNA in the last 8760 hours.  Assessment and Plan:  History of lung cancer; right upper lobe nodules: Vanessa Carlson, 61 year old female, presents today to the [facility] Interventional Radiology department for an image-guided right upper lobe nodule biopsy.  Risks and benefits of this procedure were discussed with the patient and/or patient's family including, but not limited to bleeding, infection, damage to adjacent structures or low yield requiring additional tests.  All of the questions were answered and there is agreement to proceed.  Consent signed and in chart. She has been NPO. Vitals have been  reviewed. Labs are pending but will be reviewed prior to the start of the procedure. She takes Coumadin - last dose 12/18/20. She was switched to Lovenox - last dose was 12/21/20.   Thank you for this interesting consult.  I greatly enjoyed meeting Vanessa Carlson and look forward to participating in their care.  A copy of this report was sent to the requesting provider on this date.  Electronically Signed: Soyla Dryer, AGACNP-BC [phone] 12/23/2020, 8:39 AM   I spent a total of  30 Minutes   in face to face in clinical consultation, greater than 50% of which was counseling/coordinating care for lung nodule biopsy.

## 2020-12-23 ENCOUNTER — Telehealth: Payer: Self-pay | Admitting: Oncology

## 2020-12-23 ENCOUNTER — Other Ambulatory Visit: Payer: Self-pay

## 2020-12-23 ENCOUNTER — Ambulatory Visit
Admission: RE | Admit: 2020-12-23 | Discharge: 2020-12-23 | Disposition: A | Discharge status: Home / Self Care | Payer: Medicare HMO | Source: Ambulatory Visit | Attending: Interventional Radiology | Admitting: Interventional Radiology

## 2020-12-23 ENCOUNTER — Ambulatory Visit
Admission: RE | Admit: 2020-12-23 | Discharge: 2020-12-23 | Disposition: A | Discharge status: Home / Self Care | Payer: Medicare HMO | Source: Ambulatory Visit | Attending: Oncology | Admitting: Oncology

## 2020-12-23 DIAGNOSIS — C3411 Malignant neoplasm of upper lobe, right bronchus or lung: Secondary | ICD-10-CM | POA: Insufficient documentation

## 2020-12-23 DIAGNOSIS — R911 Solitary pulmonary nodule: Secondary | ICD-10-CM | POA: Diagnosis not present

## 2020-12-23 DIAGNOSIS — Z9889 Other specified postprocedural states: Secondary | ICD-10-CM | POA: Diagnosis not present

## 2020-12-23 DIAGNOSIS — Z950 Presence of cardiac pacemaker: Secondary | ICD-10-CM | POA: Diagnosis not present

## 2020-12-23 DIAGNOSIS — R918 Other nonspecific abnormal finding of lung field: Secondary | ICD-10-CM | POA: Diagnosis not present

## 2020-12-23 DIAGNOSIS — Z85118 Personal history of other malignant neoplasm of bronchus and lung: Secondary | ICD-10-CM | POA: Diagnosis not present

## 2020-12-23 LAB — CBC
HCT: 39 % (ref 36.0–46.0)
Hemoglobin: 12.6 g/dL (ref 12.0–15.0)
MCH: 28.3 pg (ref 26.0–34.0)
MCHC: 32.3 g/dL (ref 30.0–36.0)
MCV: 87.4 fL (ref 80.0–100.0)
Platelets: 170 10*3/uL (ref 150–400)
RBC: 4.46 MIL/uL (ref 3.87–5.11)
RDW: 15.1 % (ref 11.5–15.5)
WBC: 5.4 10*3/uL (ref 4.0–10.5)
nRBC: 0 % (ref 0.0–0.2)

## 2020-12-23 LAB — PROTIME-INR
INR: 1.1 (ref 0.8–1.2)
Prothrombin Time: 14.4 seconds (ref 11.4–15.2)

## 2020-12-23 MED ORDER — SODIUM CHLORIDE 0.9 % IV SOLN: INTRAVENOUS | Status: DC

## 2020-12-23 MED ORDER — HYDROCODONE-ACETAMINOPHEN 5-325 MG PO TABS: 1.0000 | ORAL_TABLET | ORAL | Status: DC | PRN

## 2020-12-23 MED ORDER — FENTANYL CITRATE (PF) 100 MCG/2ML IJ SOLN
INTRAMUSCULAR | Status: AC
  Filled 2020-12-23: qty 2

## 2020-12-23 MED ORDER — FENTANYL CITRATE (PF) 100 MCG/2ML IJ SOLN
INTRAMUSCULAR | Status: AC | PRN
  Administered 2020-12-23 (×2): 50 ug via INTRAVENOUS

## 2020-12-23 MED ORDER — MIDAZOLAM HCL 2 MG/2ML IJ SOLN
INTRAMUSCULAR | Status: AC
  Filled 2020-12-23: qty 2

## 2020-12-23 MED ORDER — MIDAZOLAM HCL 2 MG/2ML IJ SOLN
INTRAMUSCULAR | Status: AC | PRN
  Administered 2020-12-23 (×2): 1 mg via INTRAVENOUS

## 2020-12-23 NOTE — Telephone Encounter (Signed)
Patient returned phone call to confirm MD appt on 01/01/21 at 10am.

## 2020-12-23 NOTE — Procedures (Signed)
  Procedure: CT RUL lung lesion biopsy   EBL:   minimal Complications:  none immediate  See full dictation in [employer].  Dillard Cannon MD Main # [phone] Pager  [phone] Mobile [phone]

## 2020-12-23 NOTE — Progress Notes (Signed)
Patient clinically stable post lung nodule biopsy per Dr. Jarvis Newcomer, tolerated procedure well with vital signs stable pre and post procedure. Report given to Gari Crown, RN, in [place] [facility] post procedure.

## 2020-12-23 NOTE — Telephone Encounter (Signed)
Left VM with patient to make her aware of appointment made on 5/5 to discuss biopsy results with Dr. Janese Banks. Sending appointment reminder in the mail as well.

## 2020-12-24 ENCOUNTER — Ambulatory Visit: Payer: Self-pay

## 2020-12-25 ENCOUNTER — Encounter: Payer: Self-pay | Admitting: *Deleted

## 2020-12-25 DIAGNOSIS — C3490 Malignant neoplasm of unspecified part of unspecified bronchus or lung: Secondary | ICD-10-CM

## 2020-12-25 NOTE — Progress Notes (Signed)
  Oncology Nurse Navigator Documentation  Navigator Location: [facility] (12/25/20 0900)   )Navigator Encounter Type: Other: (12/25/20 0900)                         Barriers/Navigation Needs: Coordination of Care (12/25/20 0900)   Interventions: Coordination of Care (12/25/20 0900)   Coordination of Care: Appts;Radiology (12/25/20 0900)         Per Dr. Janese Banks, pt needs PET scan and follow up with Dr. Baruch Gouty to discuss treatment options with radiation. PET scan requires authorization. Once authorized with insurance, will schedule and coordinate follow up appts.   Pt will need to be added for tumor board discussion after PET scan as well.          Time Spent with Patient: 30 (12/25/20 0900)

## 2020-12-26 LAB — SURGICAL PATHOLOGY

## 2020-12-31 ENCOUNTER — Ambulatory Visit (INDEPENDENT_AMBULATORY_CARE_PROVIDER_SITE_OTHER): Payer: Medicare HMO

## 2020-12-31 ENCOUNTER — Other Ambulatory Visit: Payer: Self-pay | Admitting: Family Medicine

## 2020-12-31 ENCOUNTER — Telehealth: Payer: Self-pay | Admitting: *Deleted

## 2020-12-31 ENCOUNTER — Other Ambulatory Visit: Payer: Self-pay

## 2020-12-31 DIAGNOSIS — Z952 Presence of prosthetic heart valve: Secondary | ICD-10-CM

## 2020-12-31 DIAGNOSIS — I480 Paroxysmal atrial fibrillation: Secondary | ICD-10-CM | POA: Diagnosis not present

## 2020-12-31 LAB — POCT INR
INR: 1.3 — AB (ref 2.0–3.0)
PT: 16

## 2020-12-31 MED ORDER — ENOXAPARIN SODIUM 100 MG/ML IJ SOSY: 100.0000 mg | PREFILLED_SYRINGE | Freq: Two times a day (BID) | INTRAMUSCULAR | 0 refills | Status: DC

## 2020-12-31 NOTE — Telephone Encounter (Signed)
It's 100mg  (29ml)every 12 hours. She will need to be taking it 1-2 additional days, so they need to dispense 3 ml.

## 2020-12-31 NOTE — Telephone Encounter (Signed)
[facility] called for clarification for Lovenox rx that was sent earlier. Please advise directions/quanitnity?

## 2020-12-31 NOTE — Patient Instructions (Addendum)
Description   Lovenox bridging Return in 2 days on 01/02/21

## 2020-12-31 NOTE — Telephone Encounter (Signed)
Pharmacy notified.

## 2021-01-01 ENCOUNTER — Telehealth: Payer: Self-pay | Admitting: *Deleted

## 2021-01-01 ENCOUNTER — Other Ambulatory Visit: Payer: Self-pay | Admitting: *Deleted

## 2021-01-01 ENCOUNTER — Encounter: Payer: Self-pay | Admitting: Oncology

## 2021-01-01 ENCOUNTER — Inpatient Hospital Stay: Payer: Medicare HMO | Attending: Oncology | Admitting: Oncology

## 2021-01-01 VITALS — BP 130/60 | HR 75 | Temp 96.3°F | Resp 20 | Wt 228.4 lb

## 2021-01-01 DIAGNOSIS — Z79899 Other long term (current) drug therapy: Secondary | ICD-10-CM | POA: Insufficient documentation

## 2021-01-01 DIAGNOSIS — Z803 Family history of malignant neoplasm of breast: Secondary | ICD-10-CM | POA: Diagnosis not present

## 2021-01-01 DIAGNOSIS — Z8541 Personal history of malignant neoplasm of cervix uteri: Secondary | ICD-10-CM | POA: Insufficient documentation

## 2021-01-01 DIAGNOSIS — I2510 Atherosclerotic heart disease of native coronary artery without angina pectoris: Secondary | ICD-10-CM | POA: Diagnosis not present

## 2021-01-01 DIAGNOSIS — I429 Cardiomyopathy, unspecified: Secondary | ICD-10-CM | POA: Insufficient documentation

## 2021-01-01 DIAGNOSIS — Z87891 Personal history of nicotine dependence: Secondary | ICD-10-CM | POA: Insufficient documentation

## 2021-01-01 DIAGNOSIS — I5042 Chronic combined systolic (congestive) and diastolic (congestive) heart failure: Secondary | ICD-10-CM | POA: Diagnosis not present

## 2021-01-01 DIAGNOSIS — Z923 Personal history of irradiation: Secondary | ICD-10-CM | POA: Diagnosis not present

## 2021-01-01 DIAGNOSIS — Z800 Family history of malignant neoplasm of digestive organs: Secondary | ICD-10-CM | POA: Insufficient documentation

## 2021-01-01 DIAGNOSIS — C3411 Malignant neoplasm of upper lobe, right bronchus or lung: Secondary | ICD-10-CM | POA: Insufficient documentation

## 2021-01-01 DIAGNOSIS — I252 Old myocardial infarction: Secondary | ICD-10-CM | POA: Insufficient documentation

## 2021-01-01 NOTE — Telephone Encounter (Signed)
Per Dr. Baruch Gouty, PET scan is needed for treatment planning with radiation. Pt made aware and is in agreement with proceeding with PET at this time. Pt confirmed appts. Instructed to call back with any further questions or needs. Pt verbalized understanding.

## 2021-01-01 NOTE — Progress Notes (Signed)
Hematology/Oncology Consult note [facility]  [phone] [phone]  Patient Care Team: Virginia Crews, MD as PCP - General (Family Medicine) End, Harrell Gave, MD as PCP - Cardiology (Cardiology) Nestor Lewandowsky, MD as Referring Physician (Cardiothoracic Surgery) Sindy Guadeloupe, MD as Consulting Physician (Oncology)   Name of the patient: Vanessa Carlson  [id number]  10-15-1960   Date of visit: 01/01/21  Diagnosis- stage I lung cancer s/p SBRT  Chief complaint/ Reason for visit-discuss pathology results and further management  Heme/Onc history: Patient is a 60 year old female with a past medical history significant for CAD, ischemic cardiomyopathy status post AICD placement, rheumatic heart disease status post mechanical mitral valve replacement on Coumadin.She was found to have a right upper lobe lung mass 2.5 cm that was hypermetabolic on PET CT scan in January 2019. No evidence of nodal involvement. She was not deemed to be a candidate for surgery and underwent SBRT to this lesion. This was biopsy-proven adenocarcinoma with acinar and solid morphologies. She then had a CT scan in January 2020 which showed enlarging satellite right upper lobe lung nodules. This was followed by a PET CT scan which showed right upper lobe lung nodules 2 of them were hypermetabolic with an SUV of 8.7 was also a thyroid nodule approximately 4 mm in size which was not definitely hypermetabolic. Patient was seen by Dr. Donella Stade following this andsh receivedSBRT for 5 fractions encompassing all these 3 nodules. Patient has not had any systemic chemotherapy for her lung cancer so far  Interval history-patient reports doing well and denies any specific complaints at this time  ECOG PS- 1 Pain scale- 0   Review of systems- Review of Systems  Constitutional: Negative for chills, fever, malaise/fatigue and weight loss.  HENT: Negative for congestion, ear  discharge and nosebleeds.   Eyes: Negative for blurred vision.  Respiratory: Negative for cough, hemoptysis, sputum production, shortness of breath and wheezing.   Cardiovascular: Negative for chest pain, palpitations, orthopnea and claudication.  Gastrointestinal: Negative for abdominal pain, blood in stool, constipation, diarrhea, heartburn, melena, nausea and vomiting.  Genitourinary: Negative for dysuria, flank pain, frequency, hematuria and urgency.  Musculoskeletal: Negative for back pain, joint pain and myalgias.  Skin: Negative for rash.  Neurological: Negative for dizziness, tingling, focal weakness, seizures, weakness and headaches.  Endo/Heme/Allergies: Does not bruise/bleed easily.  Psychiatric/Behavioral: Negative for depression and suicidal ideas. The patient does not have insomnia.        No Known Allergies   Past Medical History:  Diagnosis Date  . AICD (automatic cardioverter/defibrillator) present   . Anterior myocardial infarction [facility])    a. 10/2014 - occluded LAD, complicated by cardiogenic shock-->Med Rx as interventional team was unable to open LAD.  [name] Cancer of upper lobe of right lung ([place]) 09/2017   radiation therapy right side  . Cervical cancer ([place])    in remission for ~20 yrs, s/p hysterectomy  . Chronic combined systolic and diastolic CHF (congestive heart failure) ([place])    a. 10/2015 Echo: EF 20-25%, sev diast dysfxn, mildly reduced RV fxn. Nl MV prosthesis; b. 12/2017 Echo: EF 20-25%, diff HK. Sev ant/antsept HK, apical AK. Mild MS (mean graad 77mmHg). Mod TR. PASP 30mmHg.  [name] Coronary artery disease    a. 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  . Essential hypertension   . Ischemic cardiomyopathy    a. 03/2015 s/p MDT single lead AICD;  b. 10/2015 Echo: EF 20-25%;  c. 12/2017 Echo: EF 20-25%, diff HK.  . Mass of upper lobe of right lung    a. noted on CXR & CT 08/2017  w/ abnl PET CT.  [name] Mitral valve disease    a. 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 12/2017 Echo: Mild MS. Mean gradient 28mmHg.  [name] Personal history of radiation therapy    f/u lung ca     Past Surgical History:  Procedure Laterality Date  . APPENDECTOMY  1998  . BREAST EXCISIONAL BIOPSY Left 80s   benign  . CARDIAC CATHETERIZATION    . CARDIAC DEFIBRILLATOR PLACEMENT    . COLONOSCOPY WITH PROPOFOL N/A 01/11/2020   Procedure: COLONOSCOPY WITH PROPOFOL;  Surgeon: Jonathon Bellows, MD;  Location: [facility] ENDOSCOPY;  Service: Gastroenterology;  Laterality: N/A;  . RADICAL HYSTERECTOMY  1998  . SHOULDER SURGERY Bilateral    rotator cuff tears  . VALVE REPLACEMENT     Mitral valve; 27 mm St. Jude bileaflet valve    Social History   Socioeconomic History  . Marital status: Married    Spouse name: Tosin  . Number of children: 2  . Years of education: 28  . Highest education level: Some college, no degree  Occupational History  . Occupation: disable  Tobacco Use  . Smoking status: Former Smoker    Packs/day: 1.00    Years: 4.00    Pack years: 4.00    Types: Cigarettes    Quit date: 1998    Years since quitting: 24.3  . Smokeless tobacco: Never Used  Vaping Use  . Vaping Use: Never used  Substance and Sexual Activity  . Alcohol use: No  . Drug use: No  . Sexual activity: Yes    Partners: Male    Birth control/protection: Surgical  Other Topics Concern  . Not on file  Social History Narrative  . Not on file   Social Determinants of Health   Financial Resource Strain: Low Risk   . Difficulty of Paying Living Expenses: Not hard at all  Food Insecurity: No Food Insecurity  . Worried About Charity fundraiser in the Last Year: Never true  . Ran Out of Food in the Last Year: Never true  Transportation Needs: No Transportation Needs  . Lack of Transportation (Medical): No  . Lack of Transportation (Non-Medical): No  Physical Activity:  Inactive  . Days of Exercise per Week: 0 days  . Minutes of Exercise per Session: 0 min  Stress: No Stress Concern Present  . Feeling of Stress : Not at all  Social Connections: Socially Integrated  . Frequency of Communication with Friends and Family: More than three times a week  . Frequency of Social Gatherings with Friends and Family: More than three times a week  . Attends Religious Services: More than 4 times per year  . Active Member of Clubs or Organizations: Yes  . Attends Archivist Meetings: More than 4 times per year  . Marital Status: Married  Human resources officer Violence: Not At Risk  . Fear of Current or Ex-Partner: No  . Emotionally Abused: No  . Physically Abused: No  . Sexually Abused: No    Family History  Problem Relation Age of Onset  . Heart attack Mother   . Hypertension Mother   . Diabetes Mother   . Emphysema Father        smoker  . Diabetes Maternal Grandmother   . Kidney disease  Maternal Grandmother   . Breast cancer Maternal Aunt   . Breast cancer Maternal Aunt   . Colon cancer Maternal Uncle      Current Outpatient Medications:  .  atorvastatin (LIPITOR) 80 MG tablet, Take 1 tablet by mouth once daily, Disp: 90 tablet, Rfl: 1 .  cyclobenzaprine (FLEXERIL) 5 MG tablet, Take 1 tablet (5 mg total) by mouth 3 (three) times daily as needed for muscle spasms., Disp: 30 tablet, Rfl: 1 .  enoxaparin (LOVENOX) 100 MG/ML injection, Inject 1 mL (100 mg total) into the skin every 12 (twelve) hours., Disp: 3 mL, Rfl: 0 .  furosemide (LASIX) 40 MG tablet, Take 2 tablets by mouth twice daily, Disp: 360 tablet, Rfl: 0 .  gabapentin (NEURONTIN) 300 MG capsule, Take 1 capsule (300 mg total) by mouth 2 (two) times daily., Disp: 180 capsule, Rfl: 3 .  metoprolol succinate (TOPROL-XL) 25 MG 24 hr tablet, Take 1 tablet (25 mg total) by mouth in the morning and at bedtime. Take with or immediately following a meal., Disp: 180 tablet, Rfl: 1 .   sacubitril-valsartan (ENTRESTO) 24-26 MG, Take 1 tablet by mouth 2 (two) times daily., Disp: 180 tablet, Rfl: 3 .  spironolactone (ALDACTONE) 25 MG tablet, Take 1 tablet by mouth once daily, Disp: 30 tablet, Rfl: 0 .  traMADol (ULTRAM) 50 MG tablet, TAKE 1 TABLET BY MOUTH EVERY 6 HOURS AS NEEDED, Disp: 30 tablet, Rfl: 2 .  traZODone (DESYREL) 50 MG tablet, Take 0.5-1 tablets (25-50 mg total) by mouth at bedtime as needed for sleep., Disp: 30 tablet, Rfl: 3 .  warfarin (COUMADIN) 10 MG tablet, Take 1 tablet (10 mg total) by mouth as directed., Disp: 30 tablet, Rfl: 3 .  warfarin (COUMADIN) 7.5 MG tablet, Take 1 tablet (7.5 mg total) by mouth daily., Disp: 90 tablet, Rfl: 3 .  acetaminophen (TYLENOL) 325 MG tablet, Take 650 mg by mouth every 6 (six) hours as needed. (Patient not taking: Reported on 12/23/2020), Disp: , Rfl:  .  aspirin 81 MG chewable tablet, Chew 81 mg by mouth daily. (Patient not taking: Reported on 01/01/2021), Disp: , Rfl:  .  fluticasone (FLONASE) 50 MCG/ACT nasal spray, Place 2 sprays into both nostrils as needed for allergies or rhinitis. (Patient not taking: No sig reported), Disp: , Rfl:  .  nitroGLYCERIN (NITROSTAT) 0.3 MG SL tablet, Place 1 tablet (0.3 mg total) under the tongue every 5 (five) minutes as needed for chest pain. (Patient not taking: No sig reported), Disp: 30 tablet, Rfl: 3  Physical exam:  Vitals:   01/01/21 1020  BP: 130/60  Pulse: 75  Resp: 20  Temp: (!) 96.3 F (35.7 C)  TempSrc: Tympanic  SpO2: 100%  Weight: 228 lb 6.4 oz (103.6 kg)   Physical Exam Constitutional:      General: She is not in acute distress. Cardiovascular:     Rate and Rhythm: Normal rate and regular rhythm.     Heart sounds: Normal heart sounds.  Pulmonary:     Effort: Pulmonary effort is normal.     Breath sounds: Normal breath sounds.  Skin:    General: Skin is warm and dry.  Neurological:     Mental Status: She is alert and oriented to person, place, and time.       CMP Latest Ref Rng & Units 09/30/2020  Glucose 70 - 99 mg/dL 161(H)  BUN 6 - 20 mg/dL 13  Creatinine 0.44 - 1.00 mg/dL 0.97  Sodium 135 - 145 mmol/L  140  Potassium 3.5 - 5.1 mmol/L 4.5  Chloride 98 - 111 mmol/L 103  CO2 22 - 32 mmol/L 29  Calcium 8.9 - 10.3 mg/dL 9.0  Total Protein 6.5 - 8.1 g/dL 7.5  Total Bilirubin 0.3 - 1.2 mg/dL 1.2  Alkaline Phos 38 - 126 U/L 67  AST 15 - 41 U/L 24  ALT 0 - 44 U/L 17   CBC Latest Ref Rng & Units 12/23/2020  WBC 4.0 - 10.5 K/uL 5.4  Hemoglobin 12.0 - 15.0 g/dL 12.6  Hematocrit 36.0 - 46.0 % 39.0  Platelets 150 - 400 K/uL 170      CT BIOPSY  Result Date: 12/23/2020 CLINICAL DATA:  History of lung carcinoma post radiation therapy. Enlarging nodular component in the anterior right upper lobe. EXAM: CT GUIDED CORE BIOPSY OF RIGHT UPPER LOBE LUNG LESION ANESTHESIA/SEDATION: Intravenous Fentanyl 138mcg and Versed 2mg  were administered as conscious sedation during continuous monitoring of the patient's level of consciousness and physiological / cardiorespiratory status by the radiology RN, with a total moderate sedation time of 20 minutes. PROCEDURE: The procedure risks, benefits, and alternatives were explained to the patient. Questions regarding the procedure were encouraged and answered. The patient understands and consents to the procedure. Select axial scans through the thorax were obtained. The cardiac region was localized and an appropriate skin entry site was determined and marked. The operative field was prepped with chlorhexidinein a sterile fashion, and a sterile drape was applied covering the operative field. A sterile gown and sterile gloves were used for the procedure. Local anesthesia was provided with 1% Lidocaine. Under CT fluoroscopic guidance, a 17 gauge trocar needle was advanced to the margin of the lesion. Once needle tip position was confirmed, coaxial 18-gauge core biopsy samples were obtained, submitted in formalin to surgical  pathology. The guide needle was removed. Postprocedure scans show minimal regional alveolar hemorrhage. No pneumothorax. COMPLICATIONS: None immediate FINDINGS: Lobular anterior right upper lobe lesion was localized. Representative core biopsy samples obtained as above. IMPRESSION: 1. Technically successful CT-guided core biopsy, anterior right upper lobe enlarging mass. Electronically Signed   By: Lucrezia Europe M.D.   On: 12/23/2020 10:20   DG Chest Port 1 View  Result Date: 12/24/2020 CLINICAL DATA:  Status post right lung biopsy. EXAM: PORTABLE CHEST 1 VIEW COMPARISON:  September 23, 2017. FINDINGS: Status post cardiac valve repair. Left-sided pacemaker is unchanged in position. No pneumothorax or pleural effusion is noted. Bony thorax is unremarkable. IMPRESSION: No pneumothorax is noted. Electronically Signed   By: Marijo Conception M.D.   On: 12/24/2020 09:52     Assessment and plan- Patient is a 60 y.o. female with history of stage I lung cancer in the past s/p SBRT here to discuss CT scan results and biopsy results and further management'  In the area of known radiation fibrosis that has been a gradual increase in the size of 2 lung nodules.  These lung nodules have remained stable as compared to prior CT scan in January 2022 but have increased in size as compared to July 2021.  One of them were biopsied and was consistent with non-small cell lung cancer.  I have discussed her case with Dr. Donella Stade and he would like to get a PET CT scan before deciding if she can offer palliative radiation.  I would like to hold off on systemic chemotherapy or immunotherapy as long as possible if these nodules can be managed by local radiation  Follow-up with me to be decided based  on what radiation oncology has to say   Visit Diagnosis 1. Malignant neoplasm of upper lobe of right lung [facility])      Dr. Randa Evens, MD, MPH [facility] [place] [phone] 01/01/2021 2:29 PM

## 2021-01-02 ENCOUNTER — Ambulatory Visit (INDEPENDENT_AMBULATORY_CARE_PROVIDER_SITE_OTHER): Payer: Medicare HMO | Admitting: Family Medicine

## 2021-01-02 ENCOUNTER — Encounter: Payer: Self-pay | Admitting: Family Medicine

## 2021-01-02 ENCOUNTER — Other Ambulatory Visit: Payer: Self-pay

## 2021-01-02 DIAGNOSIS — I480 Paroxysmal atrial fibrillation: Secondary | ICD-10-CM

## 2021-01-02 DIAGNOSIS — Z952 Presence of prosthetic heart valve: Secondary | ICD-10-CM

## 2021-01-02 LAB — POCT INR
INR: 1.7 — AB (ref 2.0–3.0)
PT: 20.9

## 2021-01-02 NOTE — Patient Instructions (Signed)
Description   Take 10MG  Friday (today,01/02/21), then 7.5 MG daily. Continue  Lovenox bridging Return in 5 days on 01/07/21

## 2021-01-05 ENCOUNTER — Encounter: Payer: Self-pay | Admitting: Family Medicine

## 2021-01-06 ENCOUNTER — Ambulatory Visit (INDEPENDENT_AMBULATORY_CARE_PROVIDER_SITE_OTHER): Payer: Medicare HMO

## 2021-01-06 DIAGNOSIS — I428 Other cardiomyopathies: Secondary | ICD-10-CM | POA: Diagnosis not present

## 2021-01-07 ENCOUNTER — Ambulatory Visit
Admission: RE | Admit: 2021-01-07 | Discharge: 2021-01-07 | Disposition: A | Discharge status: Home / Self Care | Payer: Medicare HMO | Source: Ambulatory Visit | Attending: Oncology | Admitting: Oncology

## 2021-01-07 ENCOUNTER — Ambulatory Visit (INDEPENDENT_AMBULATORY_CARE_PROVIDER_SITE_OTHER): Payer: Medicare HMO

## 2021-01-07 ENCOUNTER — Other Ambulatory Visit: Payer: Self-pay

## 2021-01-07 DIAGNOSIS — I480 Paroxysmal atrial fibrillation: Secondary | ICD-10-CM | POA: Diagnosis not present

## 2021-01-07 DIAGNOSIS — C3490 Malignant neoplasm of unspecified part of unspecified bronchus or lung: Secondary | ICD-10-CM | POA: Diagnosis not present

## 2021-01-07 DIAGNOSIS — I700 Atherosclerosis of aorta: Secondary | ICD-10-CM | POA: Insufficient documentation

## 2021-01-07 DIAGNOSIS — Z951 Presence of aortocoronary bypass graft: Secondary | ICD-10-CM | POA: Insufficient documentation

## 2021-01-07 DIAGNOSIS — I517 Cardiomegaly: Secondary | ICD-10-CM | POA: Insufficient documentation

## 2021-01-07 DIAGNOSIS — Z952 Presence of prosthetic heart valve: Secondary | ICD-10-CM | POA: Diagnosis not present

## 2021-01-07 LAB — CUP PACEART REMOTE DEVICE CHECK
Battery Remaining Longevity: 76 mo
Battery Voltage: 2.96 V
Brady Statistic RV Percent Paced: 0 %
Date Time Interrogation Session: 20220511123322
HighPow Impedance: 86 Ohm
Implantable Lead Implant Date: 20160802
Implantable Lead Location: 753860
Implantable Pulse Generator Implant Date: 20160802
Lead Channel Impedance Value: 513 Ohm
Lead Channel Impedance Value: 608 Ohm
Lead Channel Pacing Threshold Amplitude: 0.875 V
Lead Channel Pacing Threshold Pulse Width: 0.4 ms
Lead Channel Sensing Intrinsic Amplitude: 13.625 mV
Lead Channel Sensing Intrinsic Amplitude: 13.625 mV
Lead Channel Setting Pacing Amplitude: 2 V
Lead Channel Setting Pacing Pulse Width: 0.4 ms
Lead Channel Setting Sensing Sensitivity: 0.3 mV

## 2021-01-07 LAB — POCT INR
INR: 4 — AB (ref 2.0–3.0)
PT: 48.9

## 2021-01-07 LAB — GLUCOSE, CAPILLARY: Glucose-Capillary: 104 mg/dL — ABNORMAL HIGH (ref 70–99)

## 2021-01-07 MED ORDER — FLUDEOXYGLUCOSE F - 18 (FDG) INJECTION
11.8000 | Freq: Once | INTRAVENOUS | Status: AC | PRN
  Administered 2021-01-07: 11.6 via INTRAVENOUS

## 2021-01-07 NOTE — Patient Instructions (Signed)
Hold for two days, then restart 7.5mg  daily except 5mg  on Saturday.  Recheck in two weeks

## 2021-01-08 NOTE — Telephone Encounter (Signed)
Ok then she doesn't need the injection (Lovenox) anymore

## 2021-01-08 NOTE — Telephone Encounter (Signed)
She is talking about Lovenox.  We will need to see what was recommended at INR appointment yesterday to see if she still needs this.

## 2021-01-12 ENCOUNTER — Encounter: Payer: Self-pay | Admitting: Radiation Oncology

## 2021-01-12 ENCOUNTER — Ambulatory Visit
Admission: RE | Admit: 2021-01-12 | Discharge: 2021-01-12 | Disposition: A | Discharge status: Home / Self Care | Payer: Medicare HMO | Source: Ambulatory Visit | Attending: Radiation Oncology | Admitting: Radiation Oncology

## 2021-01-12 VITALS — BP 114/58 | HR 70 | Temp 97.8°F | Resp 20 | Wt 228.4 lb

## 2021-01-12 DIAGNOSIS — C3411 Malignant neoplasm of upper lobe, right bronchus or lung: Secondary | ICD-10-CM | POA: Insufficient documentation

## 2021-01-12 DIAGNOSIS — Z08 Encounter for follow-up examination after completed treatment for malignant neoplasm: Secondary | ICD-10-CM | POA: Diagnosis not present

## 2021-01-12 DIAGNOSIS — I2510 Atherosclerotic heart disease of native coronary artery without angina pectoris: Secondary | ICD-10-CM | POA: Diagnosis not present

## 2021-01-12 DIAGNOSIS — I255 Ischemic cardiomyopathy: Secondary | ICD-10-CM | POA: Insufficient documentation

## 2021-01-12 DIAGNOSIS — Z923 Personal history of irradiation: Secondary | ICD-10-CM | POA: Insufficient documentation

## 2021-01-12 DIAGNOSIS — Z87891 Personal history of nicotine dependence: Secondary | ICD-10-CM | POA: Diagnosis not present

## 2021-01-12 NOTE — Progress Notes (Signed)
Radiation Oncology Follow up Note old patient new area recurrent disease right upper lobe  Name: Vanessa Carlson   Date:   01/12/2021 MRN:  [id number] DOB: Dec 05, 1960    This 61 y.o. female presents to the clinic today for reevaluation of new stage I non-small cell lung cancer of the right upper lobe after previously controlled completing SBRT x2 to her right upper lobe for again adenocarcinoma.  REFERRING PROVIDER: Virginia Crews, MD  HPI: Patient is a 60 year old female well-known to our department.  She had been initially treated back in.  2019 for a 2.5 cm hypermetabolic mass in the right upper lobe consistent with stage I non-small cell lung cancer.  She has multiple medical comorbidities including ischemic cardiomyopathy rheumatic heart disease CAD.  She went on to develop multiple other right upper lobe lung nodules which were hypermetabolic on PET CT scan.  She again received SBRT to this region.  Patient has never had systemic therapy for her lung cancer.  She has an area being followed on CT scan which is showed increased fibrosis since previous scans in July 2021.  She underwent CT-guided biopsy which was consistent with non-small cell lung cancer.  Special stains indicate this is an adenocarcinoma again.  Patient had a PET CT scan showing hypermetabolic activity again in this area of nodular density in the right upper lobe adjacent to an area of previous radiation change.  No evidence of hypermetabolic activity in nodal metastasis or distant metastatic disease is noted.  Patient continues to be fairly asymptomatic she specifically [place] cough hemoptysis or chest tightness.  I been asked to evaluate her for possibility of further radiation therapy.  COMPLICATIONS OF TREATMENT: none  FOLLOW UP COMPLIANCE: keeps appointments   PHYSICAL EXAM:  BP (!) 114/58   Pulse 70   Temp 97.8 F (36.6 C)   Resp 20   Wt 228 lb 6.4 oz (103.6 kg)   LMP  (LMP Unknown)   SpO2 100%   BMI 41.11  kg/m  Well-developed well-nourished patient in NAD. HEENT reveals PERLA, EOMI, discs not visualized.  Oral cavity is clear. No oral mucosal lesions are identified. Neck is clear without evidence of cervical or supraclavicular adenopathy. Lungs are clear to A&P. Cardiac examination is essentially unremarkable with regular rate and rhythm without murmur rub or thrill. Abdomen is benign with no organomegaly or masses noted. Motor sensory and DTR levels are equal and symmetric in the upper and lower extremities. Cranial nerves II through XII are grossly intact. Proprioception is intact. No peripheral adenopathy or edema is identified. No motor or sensory levels are noted. Crude visual fields are within normal range.  RADIOLOGY RESULTS: CT scans and PET CT scans reviewed compatible with above-stated findings  PLAN: At this time elect to go ahead with SBRT again to area of PET positivity.  Would use 4-dimensional treatment planning as well as motion restriction during simulation.  We will treat to 50 Gray over 5 fractions.  Risks and benefits of treatment including again possible fatigue possible development of cough and reirradiation of previously radiated area were all discussed in detail with the patient.  She seems to comprehend my treatment plan well.  I have personally set up and ordered CT simulation for next week.  I would like to take this opportunity to thank you for allowing me to participate in the care of your patient.Noreene Filbert, MD

## 2021-01-13 ENCOUNTER — Other Ambulatory Visit: Payer: Self-pay | Admitting: Family Medicine

## 2021-01-13 NOTE — Telephone Encounter (Signed)
Requested medications are due for refill today yes  Requested medications are on the active medication list yes  Last refill 1/9, and 4/15  Last visit Had visit 5/6 that did not address these meds/dxs  Future visit scheduled 5/25  Notes to clinic Not lab work for Lipitor since 11/2019, therefore failed protocol. Was given just 30 day supply of Protonix, it was dc'd after hospital visit so was unsure if it is to be continued. Please assess.

## 2021-01-21 ENCOUNTER — Ambulatory Visit
Admission: RE | Admit: 2021-01-21 | Discharge: 2021-01-21 | Disposition: A | Discharge status: Home / Self Care | Payer: Medicare HMO | Source: Ambulatory Visit | Attending: Radiation Oncology | Admitting: Radiation Oncology

## 2021-01-21 ENCOUNTER — Other Ambulatory Visit: Payer: Self-pay

## 2021-01-21 ENCOUNTER — Ambulatory Visit (INDEPENDENT_AMBULATORY_CARE_PROVIDER_SITE_OTHER): Payer: Medicare HMO

## 2021-01-21 DIAGNOSIS — C3411 Malignant neoplasm of upper lobe, right bronchus or lung: Secondary | ICD-10-CM | POA: Insufficient documentation

## 2021-01-21 DIAGNOSIS — Z952 Presence of prosthetic heart valve: Secondary | ICD-10-CM | POA: Diagnosis not present

## 2021-01-21 DIAGNOSIS — I480 Paroxysmal atrial fibrillation: Secondary | ICD-10-CM

## 2021-01-21 DIAGNOSIS — Z510 Encounter for antineoplastic radiation therapy: Secondary | ICD-10-CM | POA: Insufficient documentation

## 2021-01-21 LAB — POCT INR
INR: 1.7 — AB (ref 2.0–3.0)
PT: 20.5

## 2021-01-21 NOTE — Patient Instructions (Signed)
Description    7.5mg  daily.  Recheck in two weeks

## 2021-01-27 ENCOUNTER — Encounter: Payer: Medicare HMO | Admitting: Family Medicine

## 2021-01-28 NOTE — Progress Notes (Signed)
Remote ICD transmission.   

## 2021-01-29 DIAGNOSIS — C3411 Malignant neoplasm of upper lobe, right bronchus or lung: Secondary | ICD-10-CM | POA: Insufficient documentation

## 2021-01-29 DIAGNOSIS — Z510 Encounter for antineoplastic radiation therapy: Secondary | ICD-10-CM | POA: Insufficient documentation

## 2021-01-30 ENCOUNTER — Other Ambulatory Visit: Payer: Self-pay | Admitting: Family Medicine

## 2021-01-30 DIAGNOSIS — M545 Low back pain, unspecified: Secondary | ICD-10-CM

## 2021-01-30 NOTE — Telephone Encounter (Signed)
Requested medications are due for refill today yes  Requested medications are on the active medication list yes  Last refill 5/2  Last visit It was ordered from a phone call re back pain  Future visit scheduled 6/8  Notes to clinic Not Delegated

## 2021-02-02 ENCOUNTER — Ambulatory Visit (INDEPENDENT_AMBULATORY_CARE_PROVIDER_SITE_OTHER): Payer: Medicare HMO

## 2021-02-02 ENCOUNTER — Ambulatory Visit: Payer: Self-pay | Admitting: Family Medicine

## 2021-02-02 DIAGNOSIS — Z95810 Presence of automatic (implantable) cardiac defibrillator: Secondary | ICD-10-CM | POA: Diagnosis not present

## 2021-02-02 DIAGNOSIS — I5022 Chronic systolic (congestive) heart failure: Secondary | ICD-10-CM | POA: Diagnosis not present

## 2021-02-04 ENCOUNTER — Encounter: Payer: Self-pay | Admitting: Internal Medicine

## 2021-02-04 ENCOUNTER — Ambulatory Visit (INDEPENDENT_AMBULATORY_CARE_PROVIDER_SITE_OTHER): Payer: Medicare HMO | Admitting: Internal Medicine

## 2021-02-04 ENCOUNTER — Ambulatory Visit (INDEPENDENT_AMBULATORY_CARE_PROVIDER_SITE_OTHER): Payer: Medicare HMO

## 2021-02-04 ENCOUNTER — Ambulatory Visit
Admission: RE | Admit: 2021-02-04 | Discharge: 2021-02-04 | Disposition: A | Discharge status: Home / Self Care | Payer: Medicare HMO | Source: Ambulatory Visit | Attending: Radiation Oncology | Admitting: Radiation Oncology

## 2021-02-04 ENCOUNTER — Other Ambulatory Visit: Payer: Self-pay

## 2021-02-04 VITALS — BP 110/60 | HR 73 | Ht 62.0 in | Wt 229.0 lb

## 2021-02-04 DIAGNOSIS — I255 Ischemic cardiomyopathy: Secondary | ICD-10-CM | POA: Diagnosis not present

## 2021-02-04 DIAGNOSIS — E785 Hyperlipidemia, unspecified: Secondary | ICD-10-CM

## 2021-02-04 DIAGNOSIS — C3411 Malignant neoplasm of upper lobe, right bronchus or lung: Secondary | ICD-10-CM | POA: Diagnosis not present

## 2021-02-04 DIAGNOSIS — Z510 Encounter for antineoplastic radiation therapy: Secondary | ICD-10-CM | POA: Diagnosis not present

## 2021-02-04 DIAGNOSIS — I480 Paroxysmal atrial fibrillation: Secondary | ICD-10-CM

## 2021-02-04 DIAGNOSIS — C3490 Malignant neoplasm of unspecified part of unspecified bronchus or lung: Secondary | ICD-10-CM

## 2021-02-04 DIAGNOSIS — I5022 Chronic systolic (congestive) heart failure: Secondary | ICD-10-CM | POA: Diagnosis not present

## 2021-02-04 DIAGNOSIS — I2510 Atherosclerotic heart disease of native coronary artery without angina pectoris: Secondary | ICD-10-CM | POA: Diagnosis not present

## 2021-02-04 DIAGNOSIS — Z952 Presence of prosthetic heart valve: Secondary | ICD-10-CM

## 2021-02-04 DIAGNOSIS — Z87891 Personal history of nicotine dependence: Secondary | ICD-10-CM | POA: Diagnosis not present

## 2021-02-04 LAB — POCT INR
INR: 3.3 — AB (ref 2.0–3.0)
PT: 39

## 2021-02-04 NOTE — Patient Instructions (Signed)
Description    7.5mg  daily.  Recheck in 4 weeks.

## 2021-02-04 NOTE — Progress Notes (Signed)
Follow-up Outpatient Visit Date: 02/04/2021  Primary Care Provider: Virginia Crews, MD [address] [place] [address]  Chief Complaint: Follow-up CAD, heart failure, and valvular heart disease  HPI:  Vanessa Carlson is a 60 y.o. 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  I last saw her in 07/2020, at which time she reported stable exertional dyspnea and dependent edema as well as occasional brief palpitations.  We did not make any medication changes or pursue further testing.  Today, Vanessa Carlson reports feeling about the same as prior visits.  She notes occasional "twinges" of pain in her chest that happen randomly and only last a second or two.  She denies shortness of breath, palpitations, and lightheadedness.  Chronic ankle swelling is unchanged.  She is currently undergoing chest radiation for treatment of her lung cancer (treatment to Marshelle Bilger later this month).  She is tolerating her medications well.  She denies bleeding, remaining on aspirin and warfarin.  --------------------------------------------------------------------------------------------------  Cardiovascular History & Procedures: Cardiovascular Problems: Coronary artery disease status post anterior STEMI (2016) Ischemic cardiomyopathy with chronic systolic heart failure Rheumatic heart disease status post mechanical mitral valve replacement (2006)   Risk Factors: Known coronary artery disease and hypertension   Cath/PCI:  LHC (2016, [place]): Reportedly proximal occlusion of  the LAD, which could not be crossed with a wire.   CV Surgery: Mitral valve replacement (2006, [facility]): 27 mm St. Jude bileaflet valve   EP Procedures and Devices: ICD (04/01/15, [facility]): Medtronic single-chamber ICD   Non-Invasive Evaluation(s): TTE (01/05/18): Mildly dilated LV.  LVEF 20 to 25% with diffuse hypokinesis and severe hypokinesis of the anterior and anteroseptal myocardium.  Apical akinesis.  Mechanical mitral valve with mean gradient of 4 mmHg.  Mild left atrial enlargement.  Normal RV function.  Moderate TR.  PASP 43 mmHg. TTE (11/20/15): Normal LV size and wall thickness. LVEF 20-25% with severe diastolic dysfunction. Mildly reduced RV contraction. Normal MV prosthesis function.   Recent CV Pertinent Labs: Lab Results  Component Value Date   CHOL 126 12/14/2019   HDL 48 12/14/2019   LDLCALC 61 12/14/2019   LDLCALC 66 07/08/2017   TRIG 87 12/14/2019   CHOLHDL 2.9 08/18/2018   CHOLHDL 2.8 07/08/2017   INR 3.3 (A) 02/04/2021   INR 1.1 12/23/2020   K 4.5 09/30/2020   BUN 13 09/30/2020   BUN 14 12/19/2019   CREATININE 0.97 09/30/2020   CREATININE 1.14 (H) 07/08/2017    Past medical and surgical history were reviewed and updated in EPIC.  Current Meds  Medication Sig   acetaminophen (TYLENOL) 325 MG tablet Take 650 mg by mouth every 6 (six) hours as needed.   aspirin 81 MG chewable tablet Chew 81 mg by mouth daily.   atorvastatin (LIPITOR) 80 MG tablet Take 1 tablet by mouth once daily   cyclobenzaprine (FLEXERIL) 5 MG tablet Take 1 tablet (5 mg total) by mouth 3 (three) times daily as needed for muscle spasms.   fluticasone (FLONASE) 50 MCG/ACT nasal spray Place 2 sprays into both nostrils as needed for allergies or rhinitis.   furosemide (LASIX) 40 MG tablet Take 2 tablets by mouth  twice daily   gabapentin (NEURONTIN) 300 MG capsule Take 1 capsule (300 mg total) by mouth 2 (two) times daily.   metoprolol succinate (TOPROL-XL) 25 MG 24 hr tablet Take 1 tablet (25 mg total)  by mouth in the morning and at bedtime. Take with or immediately following a meal.   nitroGLYCERIN (NITROSTAT) 0.3 MG SL tablet Place 1 tablet (0.3 mg total) under the tongue every 5 (five) minutes as needed for chest pain.   sacubitril-valsartan (ENTRESTO) 24-26 MG Take 1 tablet by mouth 2 (two) times daily.   spironolactone (ALDACTONE) 25 MG tablet Take 1 tablet by mouth once daily   traMADol (ULTRAM) 50 MG tablet TAKE 1 TABLET BY MOUTH EVERY 6 HOURS AS NEEDED   traZODone (DESYREL) 50 MG tablet Take 0.5-1 tablets (25-50 mg total) by mouth at bedtime as needed for sleep.   warfarin (COUMADIN) 7.5 MG tablet Take 1 tablet (7.5 mg total) by mouth daily.    Allergies: Patient has no known allergies.  Social History   Tobacco Use   Smoking status: Former    Packs/day: 1.00    Years: 4.00    Pack years: 4.00    Types: Cigarettes    Quit date: 1998    Years since quitting: 24.4   Smokeless tobacco: Never  Vaping Use   Vaping Use: Never used  Substance Use Topics   Alcohol use: No   Drug use: No    Family History  Problem Relation Age of Onset   Heart attack Mother    Hypertension Mother    Diabetes Mother    Emphysema Father        smoker   Diabetes Maternal Grandmother    Kidney disease Maternal Grandmother    Breast cancer Maternal Aunt    Breast cancer Maternal Aunt    Colon cancer Maternal Uncle     Review of Systems: A 12-system review of systems was performed and was negative except as noted in the HPI.  --------------------------------------------------------------------------------------------------  Physical Exam: BP 110/60 (BP Location: Left Arm, Patient Position: Sitting, Cuff Size: Large)   Pulse 73   Ht 5\' 2"  (1.575 m)   Wt 229 lb (103.9 kg)   LMP  (LMP Unknown)   SpO2 98%   BMI 41.88 kg/m   General:  NAD. Neck: No JVD or HJR. Lungs: Clear to auscultation bilaterally without wheezes or crackles. Heart: Regular rate and rhythm without murmurs, rubs,  or gallops.  Mechanical S1 noted. Abdomen: Soft, nontender, nondistended. Extremities: Trace ankle edema bilaterally.  EKG:  Normal sinus rhythm with lateral infarct. No significant change since 08/06/2020.  Lab Results  Component Value Date   WBC 5.4 12/23/2020   HGB 12.6 12/23/2020   HCT 39.0 12/23/2020   MCV 87.4 12/23/2020   PLT 170 12/23/2020    Lab Results  Component Value Date   NA 140 09/30/2020   K 4.5 09/30/2020   CL 103 09/30/2020   CO2 29 09/30/2020   BUN 13 09/30/2020   CREATININE 0.97 09/30/2020   GLUCOSE 161 (H) 09/30/2020   ALT 17 09/30/2020    Lab Results  Component Value Date   CHOL 126 12/14/2019   HDL 48 12/14/2019   LDLCALC 61 12/14/2019   TRIG 87 12/14/2019   CHOLHDL 2.9 08/18/2018    --------------------------------------------------------------------------------------------------  ASSESSMENT AND PLAN: Chronic HFrEF due to ischemic cardiomyopathy: Patient appears euvolemic with stable NYHA class I-II symtpoms.  Given low normal blood pressure today, we will defer escalating her GDMT at  this time.  I will have her return at convenience for labs, including a CMP, to ensure stable renal function and potassium in the setting of furosemide, Entresto, and spironolactone use.  Coronary artery disease: No angina reported; fleeting twinges of chest pain are unlikely to be cardiac in nature.  We will continue current medications for secondary prevention.  Paroxysmal atrial fibrillation: No palpitations reported, with EKG today gain showing sinus rhythm.  Continue current regimen of warfarin and metoprolol succinate.  S/p MVR: Continue warfarin, aspirin, and SBE prophylaxis.  Hyperlipidemia: Patient to return for CMP and fasting lipid panel at her convenience.  We will continue atorvastatin 80 mg daily for now for target LDL < 70.  Lung cancer: Ongoing management per medical and radiation oncology.  Morbid obesity: BMI remains > 40.  Weight loss  encouraged through diet and exercise.  Follow-up: Return to clinic in 4 months.  [name], MD 02/05/2021 3:22 PM

## 2021-02-04 NOTE — Progress Notes (Signed)
EPIC Encounter for ICM Monitoring  Patient Name: Vanessa Carlson is a 60 y.o. female Date: 02/04/2021 Primary Care Physican: Virginia Crews, MD Primary Cardiologist: Carlis Abbott, NP HF Electrophysiologist: Caryl Comes 01/01/2021 OfficeWeight:228lbs   Transmission reviewed.  Optivol thoracic impedancesuggesting normal fluid levels.  Prescribed:  Furosemide40 mg2tablets (80 mg total) twice a day.   Spironolactone 25 mg take 1 tablet daily  Labs: 09/30/2020 Creatinine 0.97, BUN 13, Potassium 4.5, Sodium 140, GFR >60 08/06/2020 Creatinine 0.92, BUN 10, Potassium 3.8, Sodium 138, GFR >60 03/19/2020 Creatinine 0.94, BUN 17, Potassium 4.4, Sodium 141, GFR >60 12/19/2019 Creatinine 0.97, BUN 14, Potassium 4.8, Sodium 145, GFR 64-74 09/17/2019 Creatinine 0.89, BUN 13, Potassium 3.6, Sodium 140, GFR >60 A complete set of results can be found in Results Review.  Recommendations:No changes  Follow-up plan: ICM clinic phone appointment on7/06/2021. 91 day device clinic remote transmission8/04/2021.   EP/Cardiology Office Visits: Recall 5/11/2022with Dr. Caryl Comes.   Copy of ICM check sent to [place].  3 month ICM trend: 02/02/2021.    1 Year ICM trend:       [name], RN 02/04/2021 12:24 PM

## 2021-02-04 NOTE — Patient Instructions (Signed)
Medication Instructions:  Your physician recommends that you continue on your current medications as directed. Please refer to the Current Medication list given to you today.  *If you need a refill on your cardiac medications before your next appointment, please call your pharmacy*   Lab Work: Your physician recommends that you return for a FASTING lipid profile and cmp.  Please have your lab drawn at the [facility] medical mall. No appt needed. Lab hours are Mon-Fri 7:30am-5:30pm.   If you have labs (blood work) drawn today and your tests are completely normal, you will receive your results only by: [name] MyChart Message (if you have MyChart) OR . A paper copy in the mail If you have any lab test that is abnormal or we need to change your treatment, we will call you to review the results.   Testing/Procedures: None ordered   Follow-Up: At [facility], you and your health needs are our priority.  As part of our continuing mission to provide you with exceptional heart care, we have created designated Provider Care Teams.  These Care Teams include your primary Cardiologist (physician) and Advanced Practice Providers (APPs -  Physician Assistants and Nurse Practitioners) who all work together to provide you with the care you need, when you need it.  We recommend signing up for the patient portal called "MyChart".  Sign up information is provided on this After Visit Summary.  MyChart is used to connect with patients for Virtual Visits (Telemedicine).  Patients are able to view lab/test results, encounter notes, upcoming appointments, etc.  Non-urgent messages can be sent to your provider as well.   To learn more about what you can do with MyChart, go to [email or web address].    Your next appointment:   4 month(s)  The format for your next appointment:   In Person  Provider:   You may see [name], MD or one of the following Advanced Practice Providers on your designated Care Team:     Murray Hodgkins, NP  Christell Faith, PA-C  Marrianne Mood, PA-C  Cadence [place], [place]  Laurann Montana, NP    Other Instructions N/A

## 2021-02-05 ENCOUNTER — Encounter: Payer: Self-pay | Admitting: Internal Medicine

## 2021-02-06 ENCOUNTER — Ambulatory Visit
Admission: RE | Admit: 2021-02-06 | Discharge: 2021-02-06 | Disposition: A | Discharge status: Home / Self Care | Payer: Medicare HMO | Source: Ambulatory Visit | Attending: Radiation Oncology | Admitting: Radiation Oncology

## 2021-02-06 DIAGNOSIS — C3411 Malignant neoplasm of upper lobe, right bronchus or lung: Secondary | ICD-10-CM | POA: Diagnosis not present

## 2021-02-06 DIAGNOSIS — Z510 Encounter for antineoplastic radiation therapy: Secondary | ICD-10-CM | POA: Diagnosis not present

## 2021-02-09 ENCOUNTER — Ambulatory Visit
Admission: RE | Admit: 2021-02-09 | Discharge: 2021-02-09 | Disposition: A | Discharge status: Home / Self Care | Payer: Medicare HMO | Source: Ambulatory Visit | Attending: Radiation Oncology | Admitting: Radiation Oncology

## 2021-02-09 DIAGNOSIS — Z510 Encounter for antineoplastic radiation therapy: Secondary | ICD-10-CM | POA: Diagnosis not present

## 2021-02-09 DIAGNOSIS — C3411 Malignant neoplasm of upper lobe, right bronchus or lung: Secondary | ICD-10-CM | POA: Diagnosis not present

## 2021-02-10 ENCOUNTER — Encounter: Payer: Medicare HMO | Admitting: Family Medicine

## 2021-02-10 NOTE — Addendum Note (Signed)
Addended by: Raelene Bott, Yemariam Magar L on: 02/10/2021 04:17 PM   Modules accepted: Orders

## 2021-02-11 ENCOUNTER — Ambulatory Visit
Admission: RE | Admit: 2021-02-11 | Discharge: 2021-02-11 | Disposition: A | Discharge status: Home / Self Care | Payer: Medicare HMO | Source: Ambulatory Visit | Attending: Radiation Oncology | Admitting: Radiation Oncology

## 2021-02-11 ENCOUNTER — Ambulatory Visit: Payer: Medicare HMO

## 2021-02-11 DIAGNOSIS — Z510 Encounter for antineoplastic radiation therapy: Secondary | ICD-10-CM | POA: Diagnosis not present

## 2021-02-11 DIAGNOSIS — C3411 Malignant neoplasm of upper lobe, right bronchus or lung: Secondary | ICD-10-CM | POA: Diagnosis not present

## 2021-02-13 ENCOUNTER — Ambulatory Visit: Payer: Medicare HMO

## 2021-02-13 ENCOUNTER — Encounter: Payer: Self-pay | Admitting: Family Medicine

## 2021-02-13 ENCOUNTER — Other Ambulatory Visit: Payer: Self-pay | Admitting: Family Medicine

## 2021-02-13 MED ORDER — SPIRONOLACTONE 25 MG PO TABS: 1.0000 | ORAL_TABLET | Freq: Every day | ORAL | 0 refills | Status: DC

## 2021-02-16 ENCOUNTER — Ambulatory Visit
Admission: RE | Admit: 2021-02-16 | Discharge: 2021-02-16 | Disposition: A | Discharge status: Home / Self Care | Payer: Medicare HMO | Source: Ambulatory Visit | Attending: Radiation Oncology | Admitting: Radiation Oncology

## 2021-02-16 ENCOUNTER — Other Ambulatory Visit: Payer: Self-pay

## 2021-02-16 DIAGNOSIS — Z510 Encounter for antineoplastic radiation therapy: Secondary | ICD-10-CM | POA: Diagnosis not present

## 2021-02-16 DIAGNOSIS — C3411 Malignant neoplasm of upper lobe, right bronchus or lung: Secondary | ICD-10-CM | POA: Diagnosis not present

## 2021-02-16 DIAGNOSIS — Z87891 Personal history of nicotine dependence: Secondary | ICD-10-CM | POA: Diagnosis not present

## 2021-02-16 MED ORDER — SACUBITRIL-VALSARTAN 24-26 MG PO TABS: 1.0000 | ORAL_TABLET | Freq: Two times a day (BID) | ORAL | 0 refills | Status: DC

## 2021-02-16 NOTE — Telephone Encounter (Signed)
Entresto refill sent to [place] as requested.

## 2021-02-18 ENCOUNTER — Ambulatory Visit: Payer: Medicare HMO

## 2021-02-20 ENCOUNTER — Other Ambulatory Visit
Admission: RE | Admit: 2021-02-20 | Discharge: 2021-02-20 | Disposition: A | Discharge status: Home / Self Care | Payer: Medicare HMO | Attending: Internal Medicine | Admitting: Internal Medicine

## 2021-02-20 DIAGNOSIS — E785 Hyperlipidemia, unspecified: Secondary | ICD-10-CM | POA: Insufficient documentation

## 2021-02-20 LAB — LIPID PANEL
Cholesterol: 131 mg/dL (ref 0–200)
HDL: 35 mg/dL — ABNORMAL LOW (ref >40)
LDL Cholesterol: 78 mg/dL (ref 0–99)
Total CHOL/HDL Ratio: 3.7 RATIO
Triglycerides: 89 mg/dL (ref <150)
VLDL: 18 mg/dL (ref 0–40)

## 2021-02-20 LAB — COMPREHENSIVE METABOLIC PANEL
ALT: 20 U/L (ref 0–44)
AST: 25 U/L (ref 15–41)
Albumin: 4.2 g/dL (ref 3.5–5.0)
Alkaline Phosphatase: 61 U/L (ref 38–126)
Anion gap: 9 (ref 5–15)
BUN: 13 mg/dL (ref 8–23)
CO2: 27 mmol/L (ref 22–32)
Calcium: 9.2 mg/dL (ref 8.9–10.3)
Chloride: 102 mmol/L (ref 98–111)
Creatinine, Ser: 0.98 mg/dL (ref 0.44–1.00)
GFR, Estimated: >60 mL/min (ref >60)
Glucose, Bld: 143 mg/dL — ABNORMAL HIGH (ref 70–99)
Potassium: 4.4 mmol/L (ref 3.5–5.1)
Sodium: 138 mmol/L (ref 135–145)
Total Bilirubin: 1.5 mg/dL — ABNORMAL HIGH (ref 0.3–1.2)
Total Protein: 8.1 g/dL (ref 6.5–8.1)

## 2021-02-24 ENCOUNTER — Telehealth: Payer: Self-pay | Admitting: *Deleted

## 2021-02-24 NOTE — Telephone Encounter (Signed)
Attempted to call pt to discuss lab results and provider's recc.  No answer at this time. Lmtcb.

## 2021-02-24 NOTE — Telephone Encounter (Signed)
-----   Message from [name], MD sent at 02/22/2021  8:41 PM EDT ----- Please let the patient know that her LDL has risen a bit an is now above goal at 78 (target < 70).  I recommend that she continue atorvastatin 80 mg daily and add ezetimibe 10 mg daily to help lower her LDL.  She was also noted to have a slightly elevated bilirubin, which is non-specific.  I recommend that we recheck a lipid panel and hepatic function panel in ~6-8 weeks.

## 2021-02-27 ENCOUNTER — Telehealth: Payer: Self-pay | Admitting: *Deleted

## 2021-02-27 DIAGNOSIS — Z79899 Other long term (current) drug therapy: Secondary | ICD-10-CM

## 2021-02-27 DIAGNOSIS — E785 Hyperlipidemia, unspecified: Secondary | ICD-10-CM

## 2021-02-27 MED ORDER — EZETIMIBE 10 MG PO TABS: 10.0000 mg | ORAL_TABLET | Freq: Every day | ORAL | 3 refills | Status: DC

## 2021-02-27 NOTE — Telephone Encounter (Signed)
Pt notified of lab results and Dr. Darnelle Bos recc below.  Pt voiced understanding. She will:  - Continue Atorvastatin 80mg  daily - START Zetia 10mg  daily - Labs in ~6-8 weeks for Lipid / Liver panel at the [place]  Rx sent to [facility] per pt request.  Lab orders placed.  Pt has no further questions at this time.

## 2021-02-27 NOTE — Telephone Encounter (Signed)
-----   Message from [name], MD sent at 02/22/2021  8:41 PM EDT ----- Please let the patient know that her LDL has risen a bit an is now above goal at 78 (target < 70).  I recommend that she continue atorvastatin 80 mg daily and add ezetimibe 10 mg daily to help lower her LDL.  She was also noted to have a slightly elevated bilirubin, which is non-specific.  I recommend that we recheck a lipid panel and hepatic function panel in ~6-8 weeks.

## 2021-02-27 NOTE — Telephone Encounter (Signed)
See subsequent telephone encounter for further detail.

## 2021-02-28 ENCOUNTER — Encounter: Payer: Self-pay | Admitting: Family Medicine

## 2021-02-28 DIAGNOSIS — G8929 Other chronic pain: Secondary | ICD-10-CM

## 2021-02-28 DIAGNOSIS — M25569 Pain in unspecified knee: Secondary | ICD-10-CM

## 2021-03-04 ENCOUNTER — Ambulatory Visit (INDEPENDENT_AMBULATORY_CARE_PROVIDER_SITE_OTHER): Payer: Medicare HMO

## 2021-03-04 ENCOUNTER — Other Ambulatory Visit: Payer: Self-pay

## 2021-03-04 DIAGNOSIS — Z952 Presence of prosthetic heart valve: Secondary | ICD-10-CM | POA: Diagnosis not present

## 2021-03-04 DIAGNOSIS — I480 Paroxysmal atrial fibrillation: Secondary | ICD-10-CM | POA: Diagnosis not present

## 2021-03-04 LAB — POCT INR
INR: 5.5 — AB (ref 2.0–3.0)
PT: 66.5

## 2021-03-04 NOTE — Patient Instructions (Signed)
Hold for two days and then continue 7.5mg  daily.  Recheck in two weeks

## 2021-03-05 NOTE — Telephone Encounter (Signed)
Ok to place referral to ortho for knee pain

## 2021-03-09 ENCOUNTER — Ambulatory Visit (INDEPENDENT_AMBULATORY_CARE_PROVIDER_SITE_OTHER): Payer: Medicare HMO

## 2021-03-09 DIAGNOSIS — I5022 Chronic systolic (congestive) heart failure: Secondary | ICD-10-CM

## 2021-03-09 DIAGNOSIS — Z95810 Presence of automatic (implantable) cardiac defibrillator: Secondary | ICD-10-CM

## 2021-03-10 DIAGNOSIS — M25561 Pain in right knee: Secondary | ICD-10-CM | POA: Diagnosis not present

## 2021-03-11 NOTE — Progress Notes (Signed)
EPIC Encounter for ICM Monitoring  Patient Name: Vanessa Carlson is a 60 y.o. female Date: 03/11/2021 Primary Care Physican: Virginia Crews, MD Primary Cardiologist: Carlis Abbott, NP HF Electrophysiologist: Caryl Comes 02/04/2021 Office Weight: 229 lbs        Transmission reviewed.    Optivol thoracic impedance suggesting normal fluid levels.    Prescribed:  Furosemide 40 mg 2 tablets (80 mg total) twice a day.   Spironolactone 25 mg take 1 tablet daily   Labs: 02/20/2021 Creatinine 0.98, BUN 13, Potassium 4.4, Sodium 138, GFR >60 09/30/2020 Creatinine 0.97, BUN 13, Potassium 4.5, Sodium 140, GFR >60 A complete set of results can be found in Results Review.   Recommendations: No changes   Follow-up plan: ICM clinic phone appointment on 04/13/2021.   91 day device clinic remote transmission 04/07/2021.       EP/Cardiology Office Visits:  Recall 01/07/2021 with Dr. Caryl Comes.  Recall 06/04/2021 with Dr End or APP   Copy of ICM check sent to Dr. Caryl Comes.   3 month ICM trend: 03/09/2021.    1 Year ICM trend:       [name], RN 03/11/2021 8:54 AM

## 2021-03-18 ENCOUNTER — Other Ambulatory Visit: Payer: Self-pay

## 2021-03-18 ENCOUNTER — Ambulatory Visit (INDEPENDENT_AMBULATORY_CARE_PROVIDER_SITE_OTHER): Payer: Medicare HMO

## 2021-03-18 DIAGNOSIS — Z952 Presence of prosthetic heart valve: Secondary | ICD-10-CM

## 2021-03-18 DIAGNOSIS — I480 Paroxysmal atrial fibrillation: Secondary | ICD-10-CM

## 2021-03-18 LAB — POCT INR
INR: 4.2 — AB (ref 2.0–3.0)
PT: 50.7

## 2021-03-18 NOTE — Patient Instructions (Signed)
Description   Hold x 1 day, then 7.5 mg daily except 5 mg on Sat.  F/U 2 weeks

## 2021-03-21 ENCOUNTER — Other Ambulatory Visit: Payer: Self-pay | Admitting: Family Medicine

## 2021-03-21 ENCOUNTER — Other Ambulatory Visit: Payer: Self-pay | Admitting: Internal Medicine

## 2021-03-21 NOTE — Telephone Encounter (Signed)
Pt needs appt. Will send MyChart message that pt is active using. Last RF 02/13/21 #30  Requested Prescriptions  Pending Prescriptions Disp Refills   spironolactone (ALDACTONE) 25 MG tablet [Pharmacy Med Name: Spironolactone 25 MG Oral Tablet] 30 tablet 0    Sig: Take 1 tablet by mouth once daily      Cardiovascular: Diuretics - Aldosterone Antagonist Failed - 03/21/2021 11:52 AM      Failed - Valid encounter within last 6 months    Recent Outpatient Visits           2 months ago Status post mitral valve replacement   [facility] Virginia Crews, MD   10 months ago Allergic conjunctivitis of right eye   [facility] [place], Dionne Bucy, MD   1 year ago Encounter for annual wellness visit (AWV) in Medicare patient   [place], Dionne Bucy, MD   1 year ago Muscle spasm   [facility] Fenton Malling M, [place]   2 years ago 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 .    Creatinine, Ser  Date Value Ref Range Status  02/20/2021 0.98 0.44 - 1.00 mg/dL Final          Passed - K in normal range and within 360 days    Potassium  Date Value Ref Range Status  02/20/2021 4.4 3.5 - 5.1 mmol/L Final          Passed - Na in normal range and within 360 days    Sodium  Date Value Ref Range Status  02/20/2021 138 135 - 145 mmol/L Final  12/19/2019 145 (H) 134 - 144 mmol/L Final          Passed - Last BP in normal range    BP Readings from Last 1 Encounters:  02/04/21 110/60

## 2021-03-23 ENCOUNTER — Ambulatory Visit
Admission: RE | Admit: 2021-03-23 | Discharge: 2021-03-23 | Disposition: A | Discharge status: Home / Self Care | Payer: Medicare HMO | Source: Ambulatory Visit | Attending: Radiation Oncology | Admitting: Radiation Oncology

## 2021-03-23 ENCOUNTER — Other Ambulatory Visit (HOSPITAL_COMMUNITY): Payer: Self-pay | Admitting: Physician Assistant

## 2021-03-23 ENCOUNTER — Other Ambulatory Visit: Payer: Self-pay | Admitting: Internal Medicine

## 2021-03-23 ENCOUNTER — Other Ambulatory Visit: Payer: Self-pay | Admitting: *Deleted

## 2021-03-23 ENCOUNTER — Encounter: Payer: Self-pay | Admitting: Radiation Oncology

## 2021-03-23 VITALS — BP 107/60 | HR 77 | Temp 97.0°F | Resp 16 | Wt 227.6 lb

## 2021-03-23 DIAGNOSIS — Z923 Personal history of irradiation: Secondary | ICD-10-CM | POA: Insufficient documentation

## 2021-03-23 DIAGNOSIS — M25561 Pain in right knee: Secondary | ICD-10-CM

## 2021-03-23 DIAGNOSIS — C3411 Malignant neoplasm of upper lobe, right bronchus or lung: Secondary | ICD-10-CM | POA: Diagnosis not present

## 2021-03-23 NOTE — Progress Notes (Signed)
Radiation Oncology Follow up Note  Name: Vanessa Carlson   Date:   03/23/2021 MRN:  [id number] DOB: [birth year]    This 60 y.o. 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  REFERRING PROVIDER: Virginia Crews, MD  HPI: Patient is a 60 year old female now about 1 month having completed SBRT to right upper lobe.  She originally been treated in 2019 for 2.5 cm hypermetabolic mass in the right upper lobe consistent with stage I non-small cell cancer.  She went to [facility] multiple other right upper lobe nodules which were hypermetabolic and again received SBRT to this region.  She is now 1 month out and doing well.  She specifically denies cough hemoptysis chest tightness or any change in her pulmonary status..  COMPLICATIONS OF TREATMENT: none  FOLLOW UP COMPLIANCE: keeps appointments   PHYSICAL EXAM:  BP 107/60   Pulse 77   Temp (!) 97 F (36.1 C) (Tympanic)   Resp 16   Wt 227 lb 9.6 oz (103.2 kg)   LMP  (LMP Unknown)   BMI 41.63 kg/m  Well-developed well-nourished patient in NAD. HEENT reveals PERLA, EOMI, discs not visualized.  Oral cavity is clear. No oral mucosal lesions are identified. Neck is clear without evidence of cervical or supraclavicular adenopathy. Lungs are clear to A&P. Cardiac examination is essentially unremarkable with regular rate and rhythm without murmur rub or thrill. Abdomen is benign with no organomegaly or masses noted. Motor sensory and DTR levels are equal and symmetric in the upper and lower extremities. Cranial nerves II through XII are grossly intact. Proprioception is intact. No peripheral adenopathy or edema is identified. No motor or sensory levels are noted. Crude visual fields are within normal range.  RADIOLOGY RESULTS: No current films for review  PLAN: Present time patient is  doing well very low side effect profile from her recent SBRT salvage.  I am pleased with her overall progress.  Of asked to see her back in 3 months for follow-up with a CT scan prior to that visit.  Patient is to call with any concerns.  I would like to take this opportunity to thank you for allowing me to participate in the care of your patient.Noreene Filbert, MD

## 2021-03-24 NOTE — Telephone Encounter (Signed)
Refill sent yesterday.   furosemide (LASIX) 40 MG tablet 360 tablet 0 03/23/2021    Sig: Take 2 tablets by mouth twice daily   Sent to pharmacy as: furosemide (LASIX) 40 MG tablet   E-Prescribing Status: Receipt confirmed by pharmacy (03/23/2021  8:45 AM EDT)     Pharmacy  [facility] [place] [facility] [place]

## 2021-03-30 ENCOUNTER — Encounter: Payer: Self-pay | Admitting: Family Medicine

## 2021-03-30 ENCOUNTER — Telehealth: Payer: Self-pay

## 2021-03-30 NOTE — Telephone Encounter (Signed)
Canceled appointment 08/3  and patient rescheduled for 8/10

## 2021-03-30 NOTE — Telephone Encounter (Signed)
Copied from [place] [phone]. Topic: General - Other >> Mar 30, 2021  9:59 AM Leward Quan A wrote: Reason for CRM: Patient called in needing to reschedule her PT / INR appointment that is on 04/01/21. Please call Ph# [phone]

## 2021-04-01 ENCOUNTER — Ambulatory Visit: Payer: Medicare HMO

## 2021-04-04 ENCOUNTER — Other Ambulatory Visit: Payer: Self-pay | Admitting: Family Medicine

## 2021-04-04 DIAGNOSIS — M545 Low back pain, unspecified: Secondary | ICD-10-CM

## 2021-04-04 NOTE — Telephone Encounter (Signed)
Requested medication (s) are due for refill today: yes  Requested medication (s) are on the active medication list: yes  Last refill:  02/02/21 #30 1 RF  Future visit scheduled: no  Notes to clinic:  med not delegated to NT to RF   Requested Prescriptions  Pending Prescriptions Disp Refills   traMADol (ULTRAM) 50 MG tablet [Pharmacy Med Name: traMADol HCl 50 MG Oral Tablet] 30 tablet 0    Sig: TAKE 1 TABLET BY MOUTH EVERY 6 HOURS AS NEEDED      Not Delegated - Analgesics:  Opioid Agonists Failed - 04/04/2021 11:41 AM      Failed - This refill cannot be delegated      Failed - Urine Drug Screen completed in last 360 days      Failed - Valid encounter within last 6 months    Recent Outpatient Visits           3 months ago Status post mitral valve replacement   [facility] Virginia Crews, MD   11 months ago Allergic conjunctivitis of right eye   [facility] [place], Dionne Bucy, MD   1 year ago Encounter for annual wellness visit (AWV) in Medicare patient   [facility] [place], Dionne Bucy, MD   1 year ago Muscle spasm   [facility] Fenton Malling M, [place]   2 years ago 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

## 2021-04-07 ENCOUNTER — Ambulatory Visit (INDEPENDENT_AMBULATORY_CARE_PROVIDER_SITE_OTHER): Payer: Medicare HMO

## 2021-04-07 DIAGNOSIS — I5022 Chronic systolic (congestive) heart failure: Secondary | ICD-10-CM

## 2021-04-08 ENCOUNTER — Other Ambulatory Visit: Payer: Self-pay

## 2021-04-08 ENCOUNTER — Ambulatory Visit (INDEPENDENT_AMBULATORY_CARE_PROVIDER_SITE_OTHER): Payer: Medicare HMO

## 2021-04-08 DIAGNOSIS — I480 Paroxysmal atrial fibrillation: Secondary | ICD-10-CM | POA: Diagnosis not present

## 2021-04-08 DIAGNOSIS — Z952 Presence of prosthetic heart valve: Secondary | ICD-10-CM | POA: Diagnosis not present

## 2021-04-08 LAB — POCT INR
INR: 3.2 — AB (ref 2.0–3.0)
PT: 38.1

## 2021-04-08 NOTE — Patient Instructions (Signed)
Description   Hold x 1 day, then 7.5 mg daily except 5 mg on Sat.  F/U 3 weeks

## 2021-04-12 LAB — CUP PACEART REMOTE DEVICE CHECK
Battery Remaining Longevity: 68 mo
Battery Voltage: 2.93 V
Brady Statistic RV Percent Paced: 0 %
Date Time Interrogation Session: 20220811175504
HighPow Impedance: 77 Ohm
Implantable Lead Implant Date: 20160802
Implantable Lead Location: 753860
Implantable Pulse Generator Implant Date: 20160802
Lead Channel Impedance Value: 551 Ohm
Lead Channel Impedance Value: 608 Ohm
Lead Channel Pacing Threshold Amplitude: 0.875 V
Lead Channel Pacing Threshold Pulse Width: 0.4 ms
Lead Channel Sensing Intrinsic Amplitude: 13 mV
Lead Channel Sensing Intrinsic Amplitude: 13 mV
Lead Channel Setting Pacing Amplitude: 2 V
Lead Channel Setting Pacing Pulse Width: 0.4 ms
Lead Channel Setting Sensing Sensitivity: 0.3 mV

## 2021-04-13 ENCOUNTER — Ambulatory Visit (INDEPENDENT_AMBULATORY_CARE_PROVIDER_SITE_OTHER): Payer: Medicare HMO

## 2021-04-13 DIAGNOSIS — Z95810 Presence of automatic (implantable) cardiac defibrillator: Secondary | ICD-10-CM

## 2021-04-13 DIAGNOSIS — I5022 Chronic systolic (congestive) heart failure: Secondary | ICD-10-CM

## 2021-04-14 ENCOUNTER — Other Ambulatory Visit
Admission: RE | Admit: 2021-04-14 | Discharge: 2021-04-14 | Disposition: A | Discharge status: Home / Self Care | Payer: Medicare HMO | Source: Ambulatory Visit | Attending: Internal Medicine | Admitting: Internal Medicine

## 2021-04-14 DIAGNOSIS — Z79899 Other long term (current) drug therapy: Secondary | ICD-10-CM | POA: Diagnosis not present

## 2021-04-14 DIAGNOSIS — E785 Hyperlipidemia, unspecified: Secondary | ICD-10-CM | POA: Insufficient documentation

## 2021-04-14 LAB — LIPID PANEL
Cholesterol: 99 mg/dL (ref 0–200)
HDL: 34 mg/dL — ABNORMAL LOW (ref >40)
LDL Cholesterol: 52 mg/dL (ref 0–99)
Total CHOL/HDL Ratio: 2.9 RATIO
Triglycerides: 67 mg/dL (ref <150)
VLDL: 13 mg/dL (ref 0–40)

## 2021-04-14 LAB — HEPATIC FUNCTION PANEL
ALT: 16 U/L (ref 0–44)
AST: 22 U/L (ref 15–41)
Albumin: 3.9 g/dL (ref 3.5–5.0)
Alkaline Phosphatase: 59 U/L (ref 38–126)
Bilirubin, Direct: 0.2 mg/dL (ref 0.0–0.2)
Indirect Bilirubin: 1 mg/dL — ABNORMAL HIGH (ref 0.3–0.9)
Total Bilirubin: 1.2 mg/dL (ref 0.3–1.2)
Total Protein: 7.6 g/dL (ref 6.5–8.1)

## 2021-04-14 NOTE — Progress Notes (Signed)
EPIC Encounter for ICM Monitoring  Patient Name: Vanessa Carlson is a 60 y.o. female Date: 04/14/2021 Primary Care Physican: Virginia Crews, MD Primary Cardiologist: Carlis Abbott, NP HF Electrophysiologist: Caryl Comes 02/04/2021 Office Weight: 229 lbs        Transmission reviewed.    Optivol thoracic impedance suggesting normal fluid levels.    Prescribed:  Furosemide 40 mg 2 tablets (80 mg total) twice a day.   Spironolactone 25 mg take 1 tablet daily   Labs: 02/20/2021 Creatinine 0.98, BUN 13, Potassium 4.4, Sodium 138, GFR >60 09/30/2020 Creatinine 0.97, BUN 13, Potassium 4.5, Sodium 140, GFR >60 A complete set of results can be found in Results Review.   Recommendations: No changes   Follow-up plan: ICM clinic phone appointment on 05/15/2021.   91 day device clinic remote transmission 07/07/2021.       EP/Cardiology Office Visits:  Recall 01/07/2021 with Dr. Caryl Comes.  Recall 06/04/2021 with Dr End or APP   Copy of ICM check sent to Dr. Caryl Comes.    3 month ICM trend: 04/13/2021.    1 Year ICM trend:       [name], RN 04/14/2021 12:52 PM

## 2021-04-16 ENCOUNTER — Ambulatory Visit (HOSPITAL_COMMUNITY): Admission: RE | Admit: 2021-04-16 | Payer: Medicare HMO | Source: Ambulatory Visit

## 2021-04-19 ENCOUNTER — Other Ambulatory Visit: Payer: Self-pay | Admitting: Family Medicine

## 2021-04-19 NOTE — Telephone Encounter (Signed)
Requested Prescriptions  Pending Prescriptions Disp Refills  . spironolactone (ALDACTONE) 25 MG tablet [Pharmacy Med Name: Spironolactone 25 MG Oral Tablet] 30 tablet 0    Sig: TAKE 1 TABLET BY MOUTH ONCE DAILY. MUST SCHEDULE OFFICE VISIT FOR FURTHER REFILLS.     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 .    Creatinine, Ser  Date Value Ref Range Status  02/20/2021 0.98 0.44 - 1.00 mg/dL Final         Passed - K in normal range and within 360 days    Potassium  Date Value Ref Range Status  02/20/2021 4.4 3.5 - 5.1 mmol/L Final         Passed - Na in normal range and within 360 days    Sodium  Date Value Ref Range Status  02/20/2021 138 135 - 145 mmol/L Final  12/19/2019 145 (H) 134 - 144 mmol/L Final         Passed - Last BP in normal range    BP Readings from  Last 1 Encounters:  03/23/21 107/60

## 2021-04-25 ENCOUNTER — Other Ambulatory Visit: Payer: Self-pay | Admitting: Family Medicine

## 2021-04-25 NOTE — Telephone Encounter (Signed)
Requested Prescriptions  Pending Prescriptions Disp Refills  . atorvastatin (LIPITOR) 80 MG tablet [Pharmacy Med Name: Atorvastatin Calcium 80 MG Oral Tablet] 90 tablet 3    Sig: TAKE 1 TABLET BY MOUTH ONCE DAILY. OFFICE VISIT AND LABS NEEDED FOR FURTHER REFILLS.     Cardiovascular:  Antilipid - Statins Failed - 04/25/2021 12:39 PM      Failed - HDL in normal range and within 360 days    HDL  Date Value Ref Range Status  04/14/2021 34 (L) >40 mg/dL Final  12/14/2019 48 >39 mg/dL Final         Passed - Total Cholesterol in normal range and within 360 days    Cholesterol, Total  Date Value Ref Range Status  12/14/2019 126 100 - 199 mg/dL Final   Cholesterol  Date Value Ref Range Status  04/14/2021 99 0 - 200 mg/dL Final         Passed - LDL in normal range and within 360 days    LDL Cholesterol (Calc)  Date Value Ref Range Status  07/08/2017 66 mg/dL (calc) Final    Comment:    Reference range: <100 . Desirable range <100 mg/dL for primary prevention;   <70 mg/dL for patients with CHD or diabetic patients  with > or = 2 CHD risk factors. [name] LDL-C is now calculated using the Martin-Hopkins  calculation, which is a validated novel method providing  better accuracy than the Friedewald equation in the  estimation of LDL-C.  Cresenciano Genre et al. Annamaria Helling. 4315;400(86): 2061-2068  (http://education.[employer].com/faq/FAQ164)    LDL Chol Calc (NIH)  Date Value Ref Range Status  12/14/2019 61 0 - 99 mg/dL Final   LDL Cholesterol  Date Value Ref Range Status  04/14/2021 52 0 - 99 mg/dL Final    Comment:           Total Cholesterol/HDL:CHD Risk Coronary Heart Disease Risk Table                     Men   Women  1/2 Average Risk   3.4   3.3  Average Risk       5.0   4.4  2 X Average Risk   9.6   7.1  3 X Average Risk  23.4   11.0        Use the calculated Patient Ratio above and the CHD Risk Table to determine the patient's CHD Risk.        ATP III CLASSIFICATION  (LDL):  <100     mg/dL   Optimal  100-129  mg/dL   Near or Above                    Optimal  130-159  mg/dL   Borderline  160-189  mg/dL   High  >190     mg/dL   Very High Performed at [facility], [address] [place], [place] [address]          Passed - Triglycerides in normal range and within 360 days    Triglycerides  Date Value Ref Range Status  04/14/2021 67 <150 mg/dL Final         Passed - Patient is not pregnant      Passed - Valid encounter within last 12 months    Recent Outpatient Visits          3 months ago Status post mitral valve replacement   [facility] [place], Dionne Bucy,  MD   11 months ago Allergic conjunctivitis of right eye   [facility] [place], Dionne Bucy, MD   1 year ago Encounter for annual wellness visit (AWV) in Medicare patient   [facility] [place], Dionne Bucy, MD   1 year ago Muscle spasm   [facility] [place] Daring, [place]   2 years ago 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

## 2021-04-28 ENCOUNTER — Telehealth: Payer: Self-pay

## 2021-04-28 NOTE — Telephone Encounter (Signed)
Copied from [place] [id number]. Topic: General - Other >> Apr 28, 2021 11:46 AM Leward Quan A wrote: Reason for CRM: Patient would like a call back to cancel and reschedule her appointment for INR that is scheduled for 04/29/21. Can be reached at Ph# [phone]

## 2021-04-28 NOTE — Telephone Encounter (Signed)
Canceled tomorrow's appointment and rescheduled patient for 09/07 at 9:10 am.

## 2021-04-29 ENCOUNTER — Ambulatory Visit: Payer: Self-pay

## 2021-04-29 NOTE — Progress Notes (Signed)
Remote ICD transmission.   

## 2021-05-06 ENCOUNTER — Ambulatory Visit (INDEPENDENT_AMBULATORY_CARE_PROVIDER_SITE_OTHER): Payer: Medicare HMO

## 2021-05-06 ENCOUNTER — Other Ambulatory Visit: Payer: Self-pay | Admitting: Family Medicine

## 2021-05-06 ENCOUNTER — Other Ambulatory Visit: Payer: Self-pay

## 2021-05-06 DIAGNOSIS — I480 Paroxysmal atrial fibrillation: Secondary | ICD-10-CM

## 2021-05-06 DIAGNOSIS — Z952 Presence of prosthetic heart valve: Secondary | ICD-10-CM | POA: Diagnosis not present

## 2021-05-06 DIAGNOSIS — M545 Low back pain, unspecified: Secondary | ICD-10-CM

## 2021-05-06 LAB — POCT INR: INR: 3.2 — AB (ref 2.0–3.0)

## 2021-05-06 NOTE — Telephone Encounter (Signed)
Requested medication (s) are due for refill today: yes  Requested medication (s) are on the active medication list: yes  Last refill:  04/06/21 #30  Future visit scheduled: No  Notes to clinic:  Please review for refill. Refill not delegated per protocol.     Requested Prescriptions  Pending Prescriptions Disp Refills   traMADol (ULTRAM) 50 MG tablet [Pharmacy Med Name: traMADol HCl 50 MG Oral Tablet] 30 tablet 0    Sig: TAKE 1 TABLET BY MOUTH EVERY 6 HOURS AS NEEDED     Not Delegated - Analgesics:  Opioid Agonists Failed - 05/06/2021  9:26 AM      Failed - This refill cannot be delegated      Failed - Urine Drug Screen completed in last 360 days      Failed - Valid encounter within last 6 months    Recent Outpatient Visits           4 months ago Status post mitral valve replacement   [facility] [place], Dionne Bucy, MD   1 year ago Allergic conjunctivitis of right eye   [facility] [place], Dionne Bucy, MD   1 year ago Encounter for annual wellness visit (AWV) in Medicare patient   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, Clearnce Sorrel, [place]       Future Appointments             In 1 month Rollene Rotunda, Jaci Standard, [place], [place]

## 2021-05-06 NOTE — Patient Instructions (Signed)
Description   No Changes. 7.5 mg daily except 5 mg on Sat.

## 2021-05-15 ENCOUNTER — Other Ambulatory Visit: Payer: Self-pay | Admitting: Internal Medicine

## 2021-05-15 ENCOUNTER — Ambulatory Visit (INDEPENDENT_AMBULATORY_CARE_PROVIDER_SITE_OTHER): Payer: Medicare HMO

## 2021-05-15 DIAGNOSIS — Z95810 Presence of automatic (implantable) cardiac defibrillator: Secondary | ICD-10-CM

## 2021-05-15 DIAGNOSIS — I5022 Chronic systolic (congestive) heart failure: Secondary | ICD-10-CM | POA: Diagnosis not present

## 2021-05-15 NOTE — Telephone Encounter (Signed)
Please schedule F/U appointment with Dr. Saunders Revel. Thank you!

## 2021-05-15 NOTE — Progress Notes (Signed)
EPIC Encounter for ICM Monitoring  Patient Name: LAVENE PENAGOS is a 60 y.o. female Date: 05/15/2021 Primary Care Physican: Virginia Crews, MD Primary Cardiologist: Carlis Abbott, NP HF Electrophysiologist: Caryl Comes 02/04/2021 Office Weight: 229 lbs        Transmission reviewed.    Optivol thoracic impedance suggesting normal fluid levels.    Prescribed:  Furosemide 40 mg 2 tablets (80 mg total) twice a day.   Spironolactone 25 mg take 1 tablet daily   Labs: 02/20/2021 Creatinine 0.98, BUN 13, Potassium 4.4, Sodium 138, GFR >60 09/30/2020 Creatinine 0.97, BUN 13, Potassium 4.5, Sodium 140, GFR >60 A complete set of results can be found in Results Review.   Recommendations: No changes   Follow-up plan: ICM clinic phone appointment on 06/15/2021.   91 day device clinic remote transmission 07/07/2021.       EP/Cardiology Office Visits:  Recall 01/07/2021 with Dr. Caryl Comes.  Recall 06/04/2021 with Dr End or APP   Copy of ICM check sent to Dr. Caryl Comes.     3 month ICM trend: 05/15/2021.    1 Year ICM trend:       [name], RN 05/15/2021 3:23 PM

## 2021-05-18 ENCOUNTER — Ambulatory Visit (HOSPITAL_COMMUNITY): Admission: RE | Admit: 2021-05-18 | Payer: Medicare HMO | Source: Ambulatory Visit

## 2021-05-18 NOTE — Telephone Encounter (Signed)
Attempted to schedule.  LMOV to call office.  ° °

## 2021-05-19 ENCOUNTER — Other Ambulatory Visit: Payer: Self-pay | Admitting: Internal Medicine

## 2021-05-23 ENCOUNTER — Other Ambulatory Visit: Payer: Self-pay | Admitting: Family Medicine

## 2021-05-24 NOTE — Telephone Encounter (Signed)
Courtesy refill . Future visit in 1 month  

## 2021-05-25 ENCOUNTER — Ambulatory Visit (HOSPITAL_COMMUNITY): Payer: Medicare HMO

## 2021-05-26 ENCOUNTER — Other Ambulatory Visit: Payer: Self-pay

## 2021-05-26 DIAGNOSIS — I252 Old myocardial infarction: Secondary | ICD-10-CM

## 2021-05-26 DIAGNOSIS — R6520 Severe sepsis without septic shock: Secondary | ICD-10-CM | POA: Diagnosis present

## 2021-05-26 DIAGNOSIS — Z20822 Contact with and (suspected) exposure to covid-19: Secondary | ICD-10-CM | POA: Diagnosis present

## 2021-05-26 DIAGNOSIS — I517 Cardiomegaly: Secondary | ICD-10-CM | POA: Diagnosis not present

## 2021-05-26 DIAGNOSIS — I248 Other forms of acute ischemic heart disease: Secondary | ICD-10-CM | POA: Diagnosis present

## 2021-05-26 DIAGNOSIS — Z952 Presence of prosthetic heart valve: Secondary | ICD-10-CM | POA: Diagnosis not present

## 2021-05-26 DIAGNOSIS — N10 Acute tubulo-interstitial nephritis: Secondary | ICD-10-CM | POA: Diagnosis present

## 2021-05-26 DIAGNOSIS — D696 Thrombocytopenia, unspecified: Secondary | ICD-10-CM | POA: Diagnosis present

## 2021-05-26 DIAGNOSIS — J811 Chronic pulmonary edema: Secondary | ICD-10-CM | POA: Diagnosis not present

## 2021-05-26 DIAGNOSIS — I2510 Atherosclerotic heart disease of native coronary artery without angina pectoris: Secondary | ICD-10-CM | POA: Diagnosis present

## 2021-05-26 DIAGNOSIS — A4151 Sepsis due to Escherichia coli [E. coli]: Secondary | ICD-10-CM | POA: Diagnosis not present

## 2021-05-26 DIAGNOSIS — Z7901 Long term (current) use of anticoagulants: Secondary | ICD-10-CM

## 2021-05-26 DIAGNOSIS — Z87891 Personal history of nicotine dependence: Secondary | ICD-10-CM

## 2021-05-26 DIAGNOSIS — R0902 Hypoxemia: Secondary | ICD-10-CM | POA: Diagnosis not present

## 2021-05-26 DIAGNOSIS — Z833 Family history of diabetes mellitus: Secondary | ICD-10-CM

## 2021-05-26 DIAGNOSIS — R404 Transient alteration of awareness: Secondary | ICD-10-CM | POA: Diagnosis not present

## 2021-05-26 DIAGNOSIS — I5042 Chronic combined systolic (congestive) and diastolic (congestive) heart failure: Secondary | ICD-10-CM | POA: Diagnosis present

## 2021-05-26 DIAGNOSIS — Z8249 Family history of ischemic heart disease and other diseases of the circulatory system: Secondary | ICD-10-CM

## 2021-05-26 DIAGNOSIS — Z7982 Long term (current) use of aspirin: Secondary | ICD-10-CM

## 2021-05-26 DIAGNOSIS — I255 Ischemic cardiomyopathy: Secondary | ICD-10-CM | POA: Diagnosis present

## 2021-05-26 DIAGNOSIS — Z23 Encounter for immunization: Secondary | ICD-10-CM | POA: Diagnosis not present

## 2021-05-26 DIAGNOSIS — D649 Anemia, unspecified: Secondary | ICD-10-CM | POA: Diagnosis present

## 2021-05-26 DIAGNOSIS — R4182 Altered mental status, unspecified: Secondary | ICD-10-CM | POA: Diagnosis not present

## 2021-05-26 DIAGNOSIS — Z803 Family history of malignant neoplasm of breast: Secondary | ICD-10-CM | POA: Diagnosis not present

## 2021-05-26 DIAGNOSIS — E876 Hypokalemia: Secondary | ICD-10-CM | POA: Diagnosis not present

## 2021-05-26 DIAGNOSIS — Z800 Family history of malignant neoplasm of digestive organs: Secondary | ICD-10-CM | POA: Diagnosis not present

## 2021-05-26 DIAGNOSIS — G9341 Metabolic encephalopathy: Secondary | ICD-10-CM | POA: Diagnosis present

## 2021-05-26 DIAGNOSIS — I480 Paroxysmal atrial fibrillation: Secondary | ICD-10-CM | POA: Diagnosis present

## 2021-05-26 DIAGNOSIS — Z85118 Personal history of other malignant neoplasm of bronchus and lung: Secondary | ICD-10-CM

## 2021-05-26 DIAGNOSIS — R778 Other specified abnormalities of plasma proteins: Secondary | ICD-10-CM | POA: Diagnosis not present

## 2021-05-26 DIAGNOSIS — R000 Tachycardia, unspecified: Secondary | ICD-10-CM | POA: Diagnosis not present

## 2021-05-26 DIAGNOSIS — A419 Sepsis, unspecified organism: Secondary | ICD-10-CM | POA: Diagnosis not present

## 2021-05-26 DIAGNOSIS — N179 Acute kidney failure, unspecified: Secondary | ICD-10-CM | POA: Diagnosis not present

## 2021-05-26 DIAGNOSIS — E785 Hyperlipidemia, unspecified: Secondary | ICD-10-CM | POA: Diagnosis not present

## 2021-05-26 DIAGNOSIS — I110 Hypertensive heart disease with heart failure: Secondary | ICD-10-CM | POA: Diagnosis present

## 2021-05-26 DIAGNOSIS — A4150 Gram-negative sepsis, unspecified: Secondary | ICD-10-CM | POA: Diagnosis not present

## 2021-05-26 DIAGNOSIS — Z825 Family history of asthma and other chronic lower respiratory diseases: Secondary | ICD-10-CM

## 2021-05-26 DIAGNOSIS — Z90710 Acquired absence of both cervix and uterus: Secondary | ICD-10-CM

## 2021-05-26 DIAGNOSIS — G9340 Encephalopathy, unspecified: Secondary | ICD-10-CM | POA: Diagnosis present

## 2021-05-26 DIAGNOSIS — R7881 Bacteremia: Secondary | ICD-10-CM | POA: Diagnosis not present

## 2021-05-26 DIAGNOSIS — N3289 Other specified disorders of bladder: Secondary | ICD-10-CM | POA: Diagnosis not present

## 2021-05-26 DIAGNOSIS — Z923 Personal history of irradiation: Secondary | ICD-10-CM

## 2021-05-26 DIAGNOSIS — Z95810 Presence of automatic (implantable) cardiac defibrillator: Secondary | ICD-10-CM

## 2021-05-26 DIAGNOSIS — I5022 Chronic systolic (congestive) heart failure: Secondary | ICD-10-CM | POA: Diagnosis not present

## 2021-05-26 DIAGNOSIS — R0689 Other abnormalities of breathing: Secondary | ICD-10-CM | POA: Diagnosis not present

## 2021-05-26 DIAGNOSIS — B9620 Unspecified Escherichia coli [E. coli] as the cause of diseases classified elsewhere: Secondary | ICD-10-CM | POA: Diagnosis not present

## 2021-05-26 DIAGNOSIS — N390 Urinary tract infection, site not specified: Secondary | ICD-10-CM | POA: Diagnosis not present

## 2021-05-26 DIAGNOSIS — Z79899 Other long term (current) drug therapy: Secondary | ICD-10-CM

## 2021-05-26 MED ORDER — ACETAMINOPHEN 325 MG RE SUPP
650.0000 mg | Freq: Once | RECTAL | Status: AC
  Administered 2021-05-27: 650 mg via RECTAL

## 2021-05-26 MED ORDER — LACTATED RINGERS IV BOLUS (SEPSIS)
1000.0000 mL | Freq: Once | INTRAVENOUS | Status: AC
  Administered 2021-05-27: 1000 mL via INTRAVENOUS

## 2021-05-26 NOTE — ED Provider Notes (Signed)
[facility] Emergency Department Provider Note  ____________________________________________   Event Date/Time   First MD Initiated Contact with Patient 05/26/21 2354     (approximate)  I have reviewed the triage vital signs and the nursing notes.   HISTORY  Chief Complaint Fever  Level 5 caveat:  history/ROS limited by acute/critical illness  HPI Vanessa Carlson is a 60 y.o. female with extensive medical history as listed below which includes CHF with cardioverter/defibrillator and severely reduced ejection fraction.  She is normally alert and active in spite of her medical conditions.  She presents by EMS tonight for altered mental status.  History is somewhat limited but provided by the patient's son.  He reports that she was not feeling well for about 24 hours but she had no specific complaints or concerns.  She became increasingly confused and interactive over the course of the evening until her husband called 911.  For [place] she is febrile and will answer questions to tell [place] her name and birthdate but otherwise is unable to provide any history.  Her son says she has not been complaining of any pain and has not been complaining of any shortness of breath.  She has no recent history of nausea or vomiting.     Past Medical History:  Diagnosis Date   AICD (automatic cardioverter/defibrillator) present    Anterior myocardial infarction [facility])    a. 10/2014 - occluded LAD, complicated by cardiogenic shock-->Med Rx as interventional team was unable to open LAD.   Cancer of upper lobe of right lung ([place]) 09/2017   radiation therapy right side   Cervical cancer ([place])    in remission for ~20 yrs, s/p hysterectomy   Chronic combined systolic and diastolic CHF (congestive heart failure) ([place])    a. 10/2015 Echo: EF 20-25%, sev diast dysfxn, mildly reduced RV fxn. Nl MV prosthesis; b. 12/2017 Echo: EF 20-25%, diff HK. Sev ant/antsept HK, apical AK. Mild MS (mean  graad 84mmHg). Mod TR. PASP 65mmHg.   Coronary artery disease    a. 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   Essential hypertension    Ischemic cardiomyopathy    a. 03/2015 s/p MDT single lead AICD;  b. 10/2015 Echo: EF 20-25%;  c. 12/2017 Echo: EF 20-25%, diff HK.   Mass of upper lobe of right lung    a. noted on CXR & CT 08/2017 w/ abnl PET CT.   Mitral valve disease    a. 2006 s/p MVR w/ 100mm SJM bileaflet mechanical valve-->chronic coumadin/ASA; b. 12/2017 Echo: Mild MS. Mean gradient 86mmHg.   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   Neck muscle spasm 12/14/2019   Myocardiopathy ([place]) 08/18/2018   Valvular heart disease 08/18/2018   Lung cancer ([place]) 05/31/2018   Hyperlipidemia LDL goal <70 03/22/2018   Morbid obesity ([place]) 12/22/2017   Insomnia 12/07/2017   OA (osteoarthritis) of knee 07/08/2017   Chronic anticoagulation 07/08/2017   Paroxysmal atrial fibrillation (H[facility])    Coronary artery disease involving native coronary artery of native heart without angina pectoris 02/24/2017   Ischemic cardiomyopathy 02/24/2017   Rheumatic heart disease 02/24/2017   Status post mitral valve replacement 02/24/2017   Chronic HFrEF (heart failure with reduced ejection fraction) ([place]) 01/02/2017   HTN (hypertension) 01/02/2017  Past Surgical History:  Procedure Laterality Date   APPENDECTOMY  1998   BREAST EXCISIONAL BIOPSY Left 80s   benign   CARDIAC CATHETERIZATION     CARDIAC DEFIBRILLATOR PLACEMENT     COLONOSCOPY WITH PROPOFOL N/A 01/11/2020   Procedure: COLONOSCOPY WITH PROPOFOL;  Surgeon: Jonathon Bellows, MD;  Location: [facility] ENDOSCOPY;  Service: Gastroenterology;  Laterality: N/A;   RADICAL HYSTERECTOMY  1998   SHOULDER SURGERY Bilateral    rotator cuff tears   VALVE  REPLACEMENT     Mitral valve; 27 mm [place] Jude bileaflet valve    Prior to Admission medications   Medication Sig Start Date End Date Taking? Authorizing Provider  acetaminophen (TYLENOL) 325 MG tablet Take 650 mg by mouth every 6 (six) hours as needed.    [provider]  aspirin 81 MG chewable tablet Chew 81 mg by mouth daily.    [provider]  atorvastatin (LIPITOR) 80 MG tablet TAKE 1 TABLET BY MOUTH ONCE DAILY. OFFICE VISIT AND LABS NEEDED FOR FURTHER REFILLS. 04/25/21   Virginia Crews, MD  cyclobenzaprine (FLEXERIL) 5 MG tablet Take 1 tablet (5 mg total) by mouth 3 (three) times daily as needed for muscle spasms. 12/07/19   Mar Daring, PA-C  ENTRESTO 24-26 MG Take 1 tablet by mouth twice daily 05/15/21   End, Harrell Gave, MD  ezetimibe (ZETIA) 10 MG tablet Take 1 tablet (10 mg total) by mouth daily. 02/27/21 02/22/22  End, Harrell Gave, MD  fluticasone (FLONASE) 50 MCG/ACT nasal spray Place 2 sprays into both nostrils as needed for allergies or rhinitis.    [provider]  furosemide (LASIX) 40 MG tablet Take 2 tablets by mouth twice daily 03/23/21   End, Harrell Gave, MD  gabapentin (NEURONTIN) 300 MG capsule Take 1 capsule (300 mg total) by mouth 2 (two) times daily. 04/28/20   Mar Daring, PA-C  metoprolol succinate (TOPROL-XL) 25 MG 24 hr tablet Take 1 tablet (25 mg total) by mouth in the morning and at bedtime. Take with or immediately following a meal. 11/19/20   End, Harrell Gave, MD  nitroGLYCERIN (NITROSTAT) 0.3 MG SL tablet Place 1 tablet (0.3 mg total) under the tongue every 5 (five) minutes as needed for chest pain. 12/28/18   Alisa Graff, FNP  spironolactone (ALDACTONE) 25 MG tablet TAKE 1 TABLET BY MOUTH ONCE DAILY . APPOINTMENT REQUIRED FOR FUTURE REFILLS 05/24/21   Virginia Crews, MD  traMADol (ULTRAM) 50 MG tablet TAKE 1 TABLET BY MOUTH EVERY 6 HOURS AS NEEDED 05/07/21   Virginia Crews, MD  traZODone (DESYREL) 50 MG tablet  Take 0.5-1 tablets (25-50 mg total) by mouth at bedtime as needed for sleep. 12/07/17   Virginia Crews, MD  warfarin (COUMADIN) 7.5 MG tablet Take 1 tablet (7.5 mg total) by mouth daily. 04/07/20   Virginia Crews, MD    Allergies Patient has no known allergies.  Family History  Problem Relation Age of Onset   Heart attack Mother    Hypertension Mother    Diabetes Mother    Emphysema Father        smoker   Diabetes Maternal Grandmother    Kidney disease Maternal Grandmother    Breast cancer Maternal Aunt    Breast cancer Maternal Aunt    Colon cancer Maternal Uncle     Social History Social History   Tobacco Use   Smoking status: Former    Packs/day: 1.00    Years: 4.00    Pack years: 4.00  Types: Cigarettes    Quit date: 37    Years since quitting: 24.7   Smokeless tobacco: Never  Vaping Use   Vaping Use: Never used  Substance Use Topics   Alcohol use: No   Drug use: No    Review of Systems Level 5 caveat:  history/ROS limited by acute/critical illness  ____________________________________________   PHYSICAL EXAM:  VITAL SIGNS: ED Triage Vitals  Enc Vitals Group     BP 05/26/21 2346 106/64     Pulse Rate 05/26/21 2346 (!) 120     Resp 05/26/21 2346 (!) 32     Temp 05/26/21 2346 (!) 103.2 F (39.6 C)     Temp Source 05/26/21 2346 Oral     SpO2 05/26/21 2346 (!) 88 %     Weight 05/26/21 2347 59 kg (130 lb)     Height --      Head Circumference --      Peak Flow --      Pain Score 05/26/21 2347 0     Pain Loc --      Pain Edu? --      Excl. in [place]? --     Constitutional: Awake and alert but oriented only to self. Eyes: Conjunctivae are normal.  Head: Atraumatic. Nose: No congestion/rhinnorhea. Mouth/Throat: Patient is wearing a mask. Neck: No stridor.  No meningeal signs.   Cardiovascular: Tachycardia, regular rhythm. Good peripheral circulation. Respiratory: Normal respiratory effort.  No retractions.  Hypoxemic at 88% on room air  upon arrival. Gastrointestinal: Soft and nontender. No distention.  Musculoskeletal: No lower extremity tenderness nor edema. No gross deformities of extremities. Neurologic:  Normal speech and language. No gross focal neurologic deficits are appreciated.  Skin:  Skin is very warm (febrile) to the touch, dry and intact.   ____________________________________________   LABS (all labs ordered are listed, but only abnormal results are displayed)  Labs Reviewed  LACTIC ACID, PLASMA - Abnormal; Notable for the following components:      Result Value   Lactic Acid, Venous 3.6 (*)    All other components within normal limits  LACTIC ACID, PLASMA - Abnormal; Notable for the following components:   Lactic Acid, Venous 2.9 (*)    All other components within normal limits  COMPREHENSIVE METABOLIC PANEL - Abnormal; Notable for the following components:   Glucose, Bld 146 (*)    Creatinine, Ser 1.30 (*)    Calcium 8.7 (*)    Total Bilirubin 1.8 (*)    GFR, Estimated 47 (*)    All other components within normal limits  CBC WITH DIFFERENTIAL/PLATELET - Abnormal; Notable for the following components:   WBC 3.5 (*)    Platelets 131 (*)    Monocytes Absolute 0.0 (*)    All other components within normal limits  PROTIME-INR - Abnormal; Notable for the following components:   Prothrombin Time 26.0 (*)    INR 2.4 (*)    All other components within normal limits  APTT - Abnormal; Notable for the following components:   aPTT 41 (*)    All other components within normal limits  URINALYSIS, COMPLETE (UACMP) WITH MICROSCOPIC - Abnormal; Notable for the following components:   Color, Urine YELLOW (*)    APPearance CLOUDY (*)    Hgb urine dipstick MODERATE (*)    Protein, ur 30 (*)    Nitrite POSITIVE (*)    Leukocytes,Ua MODERATE (*)    Bacteria, UA MANY (*)    All other components within normal limits  TROPONIN I (HIGH SENSITIVITY) - Abnormal; Notable for the following components:   Troponin I  (High Sensitivity) 21 (*)    All other components within normal limits  TROPONIN I (HIGH SENSITIVITY) - Abnormal; Notable for the following components:   Troponin I (High Sensitivity) 127 (*)    All other components within normal limits  RESP PANEL BY RT-PCR (FLU A&B, COVID) ARPGX2  CULTURE, BLOOD (ROUTINE X 2)  CULTURE, BLOOD (ROUTINE X 2)  URINE CULTURE  PROCALCITONIN  PROCALCITONIN  HIV ANTIBODY (ROUTINE TESTING W REFLEX)  PROTIME-INR  CORTISOL-AM, BLOOD  PROCALCITONIN  BASIC METABOLIC PANEL  CBC  HEPARIN LEVEL (UNFRACTIONATED)   ____________________________________________  EKG  ED ECG REPORT I, Hinda Kehr, the attending physician, personally viewed and interpreted this ECG.  Date: 05/26/2021 EKG Time: 2358 Rate: 115 Rhythm: Sinus tachycardia QRS Axis: Right axis deviation Intervals: normal ST/T Wave abnormalities: Non-specific ST segment / T-wave changes, but no clear evidence of acute ischemia. Narrative Interpretation: no definitive evidence of acute ischemia; does not meet STEMI criteria.  ____________________________________________  RADIOLOGY I, Hinda Kehr, personally viewed and evaluated these images (plain radiographs) as part of my medical decision making, as well as reviewing the written report by the radiologist.  ED MD interpretation:  Pulmonary vascular congestion without overt edema  Official radiology report(s): DG Chest Port 1 View  Result Date: 05/27/2021 CLINICAL DATA:  Sepsis EXAM: PORTABLE CHEST 1 VIEW COMPARISON:  12/23/2020 FINDINGS: Mild cardiomegaly. Unchanged appearance of valvuloplasty, left chest wall AICD and median sternotomy wires. Normal pleural spaces. Mild pulmonary vascular congestion without overt pulmonary edema. No focal airspace consolidation. IMPRESSION: Cardiomegaly and pulmonary vascular congestion without overt pulmonary edema. Electronically Signed   By: Ulyses Jarred M.D.   On: 05/27/2021 01:45     ____________________________________________   PROCEDURES   Procedure(s) performed (including Critical Care):  .1-3 Lead EKG Interpretation Performed by: Hinda Kehr, MD Authorized by: Hinda Kehr, MD     Interpretation: abnormal     ECG rate:  118   ECG rate assessment: tachycardic     Rhythm: sinus tachycardia     Ectopy: none     Conduction: normal   .Critical Care Performed by: Hinda Kehr, MD Authorized by: Hinda Kehr, MD   Critical care provider statement:    Critical care time (minutes):  45   Critical care time was exclusive of:  Separately billable procedures and treating other patients   Critical care was necessary to treat or prevent imminent or life-threatening deterioration of the following conditions:  Sepsis   Critical care was time spent personally by me on the following activities:  Development of treatment plan with patient or surrogate, discussions with consultants, evaluation of patient's response to treatment, examination of patient, obtaining history from patient or surrogate, ordering and performing treatments and interventions, ordering and review of laboratory studies, ordering and review of radiographic studies, pulse oximetry, re-evaluation of patient's condition and review of old charts   ____________________________________________   [place] / MDM / [place] / ED COURSE  As part of my medical decision making, I reviewed the following data within the electronic MEDICAL RECORD NUMBER History obtained from family, Nursing notes reviewed and incorporated, Labs reviewed , EKG interpreted , Old chart reviewed, Radiograph reviewed , Discussed with admitting physician , and Notes from prior ED visits   Differential diagnosis includes, but is not limited to, sepsis, UTI, pneumonia, bacteremia, COVID-19, intra-abdominal infection is less likely.  The patient is on the cardiac monitor to  evaluate for evidence of arrhythmia and/or  significant heart rate changes.  Patient febrile to greater than 103 upon arrival with tachycardia in the 120s, tachypnea, and hypoxemia.  Easily meets sepsis criteria and I have initiated a septic protocol.  I will hold off on ordering antibiotics until the urine specimen is obtained but I suspect UTI.  I have started with 1 L lactated Ringer's while the first lactic acid is pending, especially given her history of severely reduced ejection fraction.  Patient is protecting her airway and is normotensive currently although that could change.  After telling me her name and date of birth, she was eventually able to tell me she has no pain and does not feel short of breath but it is unclear if she knows even what she is answering.  However she has no tenderness to palpation of her abdomen which is reassuring and she does not seem to be in distress in spite of her confusion.  I anticipate admission.     Clinical Course as of 05/27/21 0513  Wed May 27, 2021  5956 Urinalysis is strongly positive with bacteria, WBCs, leukocytes, and nitrite.  I strongly suspect this is the source of her sepsis.  Her lactic acid is 3.6 indicating severe sepsis although she has no signs of septic shock at this point.  I am ordering another 1 L LR bolus to meet the 30 mL/kg goal and I have ordered ceftriaxone 2 g IV for coverage of community-acquired urinary tract infection.  Chest x-ray and the remainder of her labs are pending at this time. [CF]  3875 INR(!): 2.4 Therapeutic INR on warfarin for mitral valve replacement [CF]  0104 Comprehensive metabolic panel(!) Mild AKI, otherwise generally reassuring comprehensive metabolic panel other than slightly elevated T bili.  High-sensitivity troponin is 21 which may represent a degree of demand ischemia but also may be normal for her given her chronic cardiomyopathy. [CF]  0105 SARS Coronavirus 2 by RT PCR: NEGATIVE [CF]  0123 CBC WITH DIFFERENTIAL(!) Mild leukopenia, otherwise  normal CBC. [CF]  0139 Procalcitonin: 11.49 [CF]  0140 Consulting hospitalist for admission [CF]  0205 DG Chest [facility] I personally reviewed the patient's imaging and agree with the radiologist's interpretation that there is some degree of pulmonary vascular congestion but no overt pulmonary edema.  No consolidative pneumonia. [CF]  0205 Discussed case electronically with Dr. Sidney Ace who will admit. [CF]    Clinical Course User Index [CF] Hinda Kehr, MD     ____________________________________________  FINAL CLINICAL IMPRESSION(S) / ED DIAGNOSES  Final diagnoses:  Severe sepsis ([place])  Acute encephalopathy  Urinary tract infection without hematuria, site unspecified     MEDICATIONS GIVEN DURING THIS VISIT:  Medications  lactated ringers bolus 1,000 mL (1,000 mLs Intravenous New Bag/Given 05/27/21 0026)  lactated ringers bolus 1,000 mL (1,000 mLs Intravenous New Bag/Given 05/27/21 0053)  cefTRIAXone (ROCEPHIN) 2 g in sodium chloride 0.9 % 100 mL IVPB (2 g Intravenous New Bag/Given 05/27/21 0052)  acetaminophen (TYLENOL) suppository 650 mg (650 mg Rectal Given 05/27/21 0021)     ED Discharge Orders     None        Note:  This document was prepared using Dragon voice recognition software and may include unintentional dictation errors.   Hinda Kehr, MD 05/27/21 [phone]

## 2021-05-26 NOTE — ED Triage Notes (Signed)
Pt brought in by [facility] for altered mental status and fever. Pt is slow to respond per family and has had a fever at home. PT is shob at this time and has a had time staying awake in triage.

## 2021-05-27 ENCOUNTER — Inpatient Hospital Stay
Admission: EM | Admit: 2021-05-27 | Discharge: 2021-05-30 | DRG: 871 | Disposition: A | Discharge status: Home / Self Care | Payer: Medicare HMO | Attending: Internal Medicine | Admitting: Internal Medicine

## 2021-05-27 ENCOUNTER — Emergency Department: Payer: Medicare HMO

## 2021-05-27 DIAGNOSIS — Z952 Presence of prosthetic heart valve: Secondary | ICD-10-CM | POA: Diagnosis not present

## 2021-05-27 DIAGNOSIS — R778 Other specified abnormalities of plasma proteins: Secondary | ICD-10-CM

## 2021-05-27 DIAGNOSIS — N179 Acute kidney failure, unspecified: Secondary | ICD-10-CM | POA: Diagnosis present

## 2021-05-27 DIAGNOSIS — G9340 Encephalopathy, unspecified: Secondary | ICD-10-CM

## 2021-05-27 DIAGNOSIS — N3289 Other specified disorders of bladder: Secondary | ICD-10-CM | POA: Diagnosis not present

## 2021-05-27 DIAGNOSIS — I5042 Chronic combined systolic (congestive) and diastolic (congestive) heart failure: Secondary | ICD-10-CM | POA: Diagnosis present

## 2021-05-27 DIAGNOSIS — N10 Acute tubulo-interstitial nephritis: Secondary | ICD-10-CM

## 2021-05-27 DIAGNOSIS — A4150 Gram-negative sepsis, unspecified: Secondary | ICD-10-CM | POA: Diagnosis not present

## 2021-05-27 DIAGNOSIS — Z23 Encounter for immunization: Secondary | ICD-10-CM | POA: Diagnosis present

## 2021-05-27 DIAGNOSIS — N390 Urinary tract infection, site not specified: Secondary | ICD-10-CM

## 2021-05-27 DIAGNOSIS — E876 Hypokalemia: Secondary | ICD-10-CM | POA: Diagnosis not present

## 2021-05-27 DIAGNOSIS — R6520 Severe sepsis without septic shock: Secondary | ICD-10-CM

## 2021-05-27 DIAGNOSIS — I5022 Chronic systolic (congestive) heart failure: Secondary | ICD-10-CM | POA: Diagnosis present

## 2021-05-27 DIAGNOSIS — G9341 Metabolic encephalopathy: Secondary | ICD-10-CM | POA: Diagnosis present

## 2021-05-27 DIAGNOSIS — I5023 Acute on chronic systolic (congestive) heart failure: Secondary | ICD-10-CM | POA: Diagnosis present

## 2021-05-27 DIAGNOSIS — I10 Essential (primary) hypertension: Secondary | ICD-10-CM | POA: Diagnosis present

## 2021-05-27 DIAGNOSIS — R0902 Hypoxemia: Secondary | ICD-10-CM | POA: Diagnosis present

## 2021-05-27 DIAGNOSIS — Z8249 Family history of ischemic heart disease and other diseases of the circulatory system: Secondary | ICD-10-CM | POA: Diagnosis not present

## 2021-05-27 DIAGNOSIS — Z803 Family history of malignant neoplasm of breast: Secondary | ICD-10-CM | POA: Diagnosis not present

## 2021-05-27 DIAGNOSIS — D649 Anemia, unspecified: Secondary | ICD-10-CM | POA: Diagnosis present

## 2021-05-27 DIAGNOSIS — I480 Paroxysmal atrial fibrillation: Secondary | ICD-10-CM | POA: Diagnosis present

## 2021-05-27 DIAGNOSIS — E785 Hyperlipidemia, unspecified: Secondary | ICD-10-CM

## 2021-05-27 DIAGNOSIS — I252 Old myocardial infarction: Secondary | ICD-10-CM | POA: Diagnosis not present

## 2021-05-27 DIAGNOSIS — A4151 Sepsis due to Escherichia coli [E. coli]: Secondary | ICD-10-CM | POA: Diagnosis present

## 2021-05-27 DIAGNOSIS — I248 Other forms of acute ischemic heart disease: Secondary | ICD-10-CM | POA: Diagnosis present

## 2021-05-27 DIAGNOSIS — Z800 Family history of malignant neoplasm of digestive organs: Secondary | ICD-10-CM | POA: Diagnosis not present

## 2021-05-27 DIAGNOSIS — D696 Thrombocytopenia, unspecified: Secondary | ICD-10-CM | POA: Diagnosis present

## 2021-05-27 DIAGNOSIS — B9620 Unspecified Escherichia coli [E. coli] as the cause of diseases classified elsewhere: Secondary | ICD-10-CM | POA: Diagnosis not present

## 2021-05-27 DIAGNOSIS — Z20822 Contact with and (suspected) exposure to covid-19: Secondary | ICD-10-CM | POA: Diagnosis present

## 2021-05-27 DIAGNOSIS — R7881 Bacteremia: Secondary | ICD-10-CM

## 2021-05-27 DIAGNOSIS — A419 Sepsis, unspecified organism: Secondary | ICD-10-CM

## 2021-05-27 DIAGNOSIS — I110 Hypertensive heart disease with heart failure: Secondary | ICD-10-CM | POA: Diagnosis present

## 2021-05-27 DIAGNOSIS — I2510 Atherosclerotic heart disease of native coronary artery without angina pectoris: Secondary | ICD-10-CM | POA: Diagnosis present

## 2021-05-27 DIAGNOSIS — I255 Ischemic cardiomyopathy: Secondary | ICD-10-CM | POA: Diagnosis present

## 2021-05-27 DIAGNOSIS — I517 Cardiomegaly: Secondary | ICD-10-CM | POA: Diagnosis not present

## 2021-05-27 DIAGNOSIS — J811 Chronic pulmonary edema: Secondary | ICD-10-CM | POA: Diagnosis not present

## 2021-05-27 DIAGNOSIS — Z7901 Long term (current) use of anticoagulants: Secondary | ICD-10-CM | POA: Diagnosis not present

## 2021-05-27 LAB — BLOOD CULTURE ID PANEL (REFLEXED) - BCID2

## 2021-05-27 LAB — RESP PANEL BY RT-PCR (FLU A&B, COVID) ARPGX2
Influenza A by PCR: NEGATIVE
Influenza B by PCR: NEGATIVE
SARS Coronavirus 2 by RT PCR: NEGATIVE

## 2021-05-27 LAB — HEPARIN LEVEL (UNFRACTIONATED)
Heparin Unfractionated: 0.43 IU/mL (ref 0.30–0.70)
Heparin Unfractionated: 0.3 IU/mL (ref 0.30–0.70)

## 2021-05-27 LAB — URINALYSIS, COMPLETE (UACMP) WITH MICROSCOPIC
Bilirubin Urine: NEGATIVE
Glucose, UA: NEGATIVE mg/dL
Ketones, ur: NEGATIVE mg/dL
Nitrite: POSITIVE — AB
Protein, ur: 30 mg/dL — AB
Specific Gravity, Urine: 1.014 (ref 1.005–1.030)
pH: 5 (ref 5.0–8.0)

## 2021-05-27 LAB — PROTIME-INR
INR: 2.2 — ABNORMAL HIGH (ref 0.8–1.2)
INR: 2.4 — ABNORMAL HIGH (ref 0.8–1.2)
Prothrombin Time: 24.3 seconds — ABNORMAL HIGH (ref 11.4–15.2)
Prothrombin Time: 26 seconds — ABNORMAL HIGH (ref 11.4–15.2)

## 2021-05-27 LAB — CBC WITH DIFFERENTIAL/PLATELET
Abs Immature Granulocytes: 0 10*3/uL (ref 0.00–0.07)
Basophils Absolute: 0 10*3/uL (ref 0.0–0.1)
Basophils Relative: 0 %
Eosinophils Absolute: 0 10*3/uL (ref 0.0–0.5)
Eosinophils Relative: 0 %
HCT: 38.3 % (ref 36.0–46.0)
Hemoglobin: 12.6 g/dL (ref 12.0–15.0)
Immature Granulocytes: 0 %
Lymphocytes Relative: 26 %
Lymphs Abs: 0.9 10*3/uL (ref 0.7–4.0)
MCH: 30.4 pg (ref 26.0–34.0)
MCHC: 32.9 g/dL (ref 30.0–36.0)
MCV: 92.3 fL (ref 80.0–100.0)
Monocytes Absolute: 0 10*3/uL — ABNORMAL LOW (ref 0.1–1.0)
Monocytes Relative: 1 %
Neutro Abs: 2.6 10*3/uL (ref 1.7–7.7)
Neutrophils Relative %: 73 %
Platelets: 131 10*3/uL — ABNORMAL LOW (ref 150–400)
RBC: 4.15 MIL/uL (ref 3.87–5.11)
RDW: 15.4 % (ref 11.5–15.5)
WBC: 3.5 10*3/uL — ABNORMAL LOW (ref 4.0–10.5)
nRBC: 0 % (ref 0.0–0.2)

## 2021-05-27 LAB — COMPREHENSIVE METABOLIC PANEL
ALT: 20 U/L (ref 0–44)
AST: 33 U/L (ref 15–41)
Albumin: 3.8 g/dL (ref 3.5–5.0)
Alkaline Phosphatase: 93 U/L (ref 38–126)
Anion gap: 10 (ref 5–15)
BUN: 16 mg/dL (ref 8–23)
CO2: 26 mmol/L (ref 22–32)
Calcium: 8.7 mg/dL — ABNORMAL LOW (ref 8.9–10.3)
Chloride: 102 mmol/L (ref 98–111)
Creatinine, Ser: 1.3 mg/dL — ABNORMAL HIGH (ref 0.44–1.00)
GFR, Estimated: 47 mL/min — ABNORMAL LOW (ref >60)
Glucose, Bld: 146 mg/dL — ABNORMAL HIGH (ref 70–99)
Potassium: 3.6 mmol/L (ref 3.5–5.1)
Sodium: 138 mmol/L (ref 135–145)
Total Bilirubin: 1.8 mg/dL — ABNORMAL HIGH (ref 0.3–1.2)
Total Protein: 7.3 g/dL (ref 6.5–8.1)

## 2021-05-27 LAB — BASIC METABOLIC PANEL
Anion gap: 12 (ref 5–15)
BUN: 18 mg/dL (ref 8–23)
CO2: 24 mmol/L (ref 22–32)
Calcium: 8.4 mg/dL — ABNORMAL LOW (ref 8.9–10.3)
Chloride: 102 mmol/L (ref 98–111)
Creatinine, Ser: 1.39 mg/dL — ABNORMAL HIGH (ref 0.44–1.00)
GFR, Estimated: 43 mL/min — ABNORMAL LOW (ref >60)
Glucose, Bld: 152 mg/dL — ABNORMAL HIGH (ref 70–99)
Potassium: 3 mmol/L — ABNORMAL LOW (ref 3.5–5.1)
Sodium: 138 mmol/L (ref 135–145)

## 2021-05-27 LAB — PROCALCITONIN
Procalcitonin: 11.49 ng/mL
Procalcitonin: 82.17 ng/mL
Procalcitonin: 83.72 ng/mL

## 2021-05-27 LAB — CBC
HCT: 33.1 % — ABNORMAL LOW (ref 36.0–46.0)
Hemoglobin: 10.9 g/dL — ABNORMAL LOW (ref 12.0–15.0)
MCH: 29.1 pg (ref 26.0–34.0)
MCHC: 32.9 g/dL (ref 30.0–36.0)
MCV: 88.3 fL (ref 80.0–100.0)
Platelets: 129 10*3/uL — ABNORMAL LOW (ref 150–400)
RBC: 3.75 MIL/uL — ABNORMAL LOW (ref 3.87–5.11)
RDW: 15.4 % (ref 11.5–15.5)
WBC: 12.5 10*3/uL — ABNORMAL HIGH (ref 4.0–10.5)
nRBC: 0 % (ref 0.0–0.2)

## 2021-05-27 LAB — LACTIC ACID, PLASMA
Lactic Acid, Venous: 2.9 mmol/L (ref 0.5–1.9)
Lactic Acid, Venous: 3.6 mmol/L (ref 0.5–1.9)

## 2021-05-27 LAB — TROPONIN I (HIGH SENSITIVITY)
Troponin I (High Sensitivity): 127 ng/L (ref <18)
Troponin I (High Sensitivity): 21 ng/L — ABNORMAL HIGH (ref <18)

## 2021-05-27 LAB — HIV ANTIBODY (ROUTINE TESTING W REFLEX): HIV Screen 4th Generation wRfx: NONREACTIVE

## 2021-05-27 LAB — APTT: aPTT: 41 seconds — ABNORMAL HIGH (ref 24–36)

## 2021-05-27 LAB — CORTISOL-AM, BLOOD: Cortisol - AM: 37.1 ug/dL — ABNORMAL HIGH (ref 6.7–22.6)

## 2021-05-27 MED ORDER — CEFTRIAXONE SODIUM 2 G IJ SOLR: 2.0000 g | INTRAMUSCULAR | Status: DC

## 2021-05-27 MED ORDER — SACUBITRIL-VALSARTAN 24-26 MG PO TABS: 1.0000 | ORAL_TABLET | Freq: Two times a day (BID) | ORAL | Status: DC

## 2021-05-27 MED ORDER — TRAZODONE HCL 50 MG PO TABS: 25.0000 mg | ORAL_TABLET | Freq: Every evening | ORAL | Status: DC | PRN

## 2021-05-27 MED ORDER — LACTATED RINGERS IV BOLUS (SEPSIS)
1000.0000 mL | Freq: Once | INTRAVENOUS | Status: AC
  Administered 2021-05-27: 1000 mL via INTRAVENOUS

## 2021-05-27 MED ORDER — WARFARIN SODIUM 7.5 MG PO TABS: 7.5000 mg | ORAL_TABLET | Freq: Every day | ORAL | Status: DC

## 2021-05-27 MED ORDER — SODIUM CHLORIDE 0.9 % IV SOLN
2.0000 g | INTRAVENOUS | Status: DC
  Administered 2021-05-27 – 2021-05-30 (×5): 2 g via INTRAVENOUS
  Filled 2021-05-27 (×2): qty 20
  Filled 2021-05-27: qty 2
  Filled 2021-05-27 (×2): qty 20

## 2021-05-27 MED ORDER — MORPHINE SULFATE (PF) 2 MG/ML IV SOLN: 2.0000 mg | INTRAVENOUS | Status: DC | PRN

## 2021-05-27 MED ORDER — FLUTICASONE PROPIONATE 50 MCG/ACT NA SUSP
2.0000 | NASAL | Status: DC | PRN
  Filled 2021-05-27: qty 16

## 2021-05-27 MED ORDER — MAGNESIUM HYDROXIDE 400 MG/5ML PO SUSP: 30.0000 mL | Freq: Every day | ORAL | Status: DC | PRN

## 2021-05-27 MED ORDER — ENOXAPARIN SODIUM 40 MG/0.4ML IJ SOSY: 40.0000 mg | PREFILLED_SYRINGE | INTRAMUSCULAR | Status: DC

## 2021-05-27 MED ORDER — POTASSIUM CHLORIDE CRYS ER 20 MEQ PO TBCR
40.0000 meq | EXTENDED_RELEASE_TABLET | Freq: Once | ORAL | Status: AC
  Administered 2021-05-27: 40 meq via ORAL
  Filled 2021-05-27: qty 2

## 2021-05-27 MED ORDER — CYCLOBENZAPRINE HCL 10 MG PO TABS: 5.0000 mg | ORAL_TABLET | Freq: Three times a day (TID) | ORAL | Status: DC | PRN

## 2021-05-27 MED ORDER — FUROSEMIDE 40 MG PO TABS: 80.0000 mg | ORAL_TABLET | Freq: Two times a day (BID) | ORAL | Status: DC

## 2021-05-27 MED ORDER — SODIUM CHLORIDE 0.9 % IV BOLUS
250.0000 mL | Freq: Once | INTRAVENOUS | Status: AC
  Administered 2021-05-27: 250 mL via INTRAVENOUS

## 2021-05-27 MED ORDER — ONDANSETRON HCL 4 MG/2ML IJ SOLN: 4.0000 mg | Freq: Four times a day (QID) | INTRAMUSCULAR | Status: DC | PRN

## 2021-05-27 MED ORDER — ACETAMINOPHEN 325 MG PO TABS
650.0000 mg | ORAL_TABLET | Freq: Four times a day (QID) | ORAL | Status: DC | PRN
  Administered 2021-05-27 – 2021-05-29 (×3): 650 mg via ORAL
  Filled 2021-05-27 (×3): qty 2

## 2021-05-27 MED ORDER — NITROGLYCERIN 0.4 MG SL SUBL: 0.4000 mg | SUBLINGUAL_TABLET | SUBLINGUAL | Status: DC | PRN

## 2021-05-27 MED ORDER — ATORVASTATIN CALCIUM 80 MG PO TABS
80.0000 mg | ORAL_TABLET | Freq: Every day | ORAL | Status: DC
  Administered 2021-05-27 – 2021-05-30 (×4): 80 mg via ORAL
  Filled 2021-05-27: qty 1
  Filled 2021-05-27: qty 4
  Filled 2021-05-27: qty 1
  Filled 2021-05-27: qty 4

## 2021-05-27 MED ORDER — TRAMADOL HCL 50 MG PO TABS: 50.0000 mg | ORAL_TABLET | Freq: Four times a day (QID) | ORAL | Status: DC | PRN

## 2021-05-27 MED ORDER — HEPARIN (PORCINE) 25000 UT/250ML-% IV SOLN
1100.0000 [IU]/h | INTRAVENOUS | Status: DC
  Administered 2021-05-27: 750 [IU]/h via INTRAVENOUS
  Administered 2021-05-28: 1000 [IU]/h via INTRAVENOUS
  Filled 2021-05-27 (×2): qty 250

## 2021-05-27 MED ORDER — SODIUM CHLORIDE 0.9 % IV SOLN: INTRAVENOUS | Status: DC

## 2021-05-27 MED ORDER — HEPARIN BOLUS VIA INFUSION
3900.0000 [IU] | Freq: Once | INTRAVENOUS | Status: AC
  Administered 2021-05-27: 3900 [IU] via INTRAVENOUS
  Filled 2021-05-27: qty 3900

## 2021-05-27 MED ORDER — EZETIMIBE 10 MG PO TABS
10.0000 mg | ORAL_TABLET | Freq: Every day | ORAL | Status: DC
  Administered 2021-05-27 – 2021-05-30 (×4): 10 mg via ORAL
  Filled 2021-05-27 (×4): qty 1

## 2021-05-27 MED ORDER — SPIRONOLACTONE 25 MG PO TABS: 25.0000 mg | ORAL_TABLET | Freq: Every day | ORAL | Status: DC

## 2021-05-27 MED ORDER — ASPIRIN 81 MG PO CHEW
81.0000 mg | CHEWABLE_TABLET | Freq: Every day | ORAL | Status: DC
  Administered 2021-05-27 – 2021-05-30 (×4): 81 mg via ORAL
  Filled 2021-05-27 (×4): qty 1

## 2021-05-27 MED ORDER — METOPROLOL SUCCINATE ER 50 MG PO TB24: 25.0000 mg | ORAL_TABLET | Freq: Every day | ORAL | Status: DC

## 2021-05-27 MED ORDER — ACETAMINOPHEN 650 MG RE SUPP
650.0000 mg | Freq: Four times a day (QID) | RECTAL | Status: DC | PRN
  Filled 2021-05-27: qty 1

## 2021-05-27 MED ORDER — ONDANSETRON HCL 4 MG PO TABS: 4.0000 mg | ORAL_TABLET | Freq: Four times a day (QID) | ORAL | Status: DC | PRN

## 2021-05-27 MED ORDER — GABAPENTIN 300 MG PO CAPS
300.0000 mg | ORAL_CAPSULE | Freq: Two times a day (BID) | ORAL | Status: DC
  Administered 2021-05-27 – 2021-05-30 (×7): 300 mg via ORAL
  Filled 2021-05-27 (×7): qty 1

## 2021-05-27 NOTE — Progress Notes (Signed)
ANTICOAGULATION CONSULT NOTE - Initial Consult  Pharmacy Consult for Heparin  Indication: chest pain/ACS  No Known Allergies  Patient Measurements: Weight: 102.1 kg (225 lb) Heparin Dosing Weight: 65.5 kg   Vital Signs: Temp: 102.4 F (39.1 C) (09/28 0318) Temp Source: Oral (09/28 0318) BP: 96/59 (09/28 0300) Pulse Rate: 113 (09/28 0300)  Labs: Recent Labs    05/27/21 0004 05/27/21 0225  HGB 12.6  --   HCT 38.3  --   PLT 131*  --   APTT 41*  --   LABPROT 26.0*  --   INR 2.4*  --   CREATININE 1.30*  --   TROPONINIHS 21* 127*     Estimated Creatinine Clearance: 50.9 mL/min (A) (by C-G formula based on SCr of 1.3 mg/dL (H)).   Medical History: Past Medical History:  Diagnosis Date   AICD (automatic cardioverter/defibrillator) present    Anterior myocardial infarction [facility])    a. 10/2014 - occluded LAD, complicated by cardiogenic shock-->Med Rx as interventional team was unable to open LAD.   Cancer of upper lobe of right lung ([place]) 09/2017   radiation therapy right side   Cervical cancer ([place])    in remission for ~20 yrs, s/p hysterectomy   Chronic combined systolic and diastolic CHF (congestive heart failure) ([place])    a. 10/2015 Echo: EF 20-25%, sev diast dysfxn, mildly reduced RV fxn. Nl MV prosthesis; b. 12/2017 Echo: EF 20-25%, diff HK. Sev ant/antsept HK, apical AK. Mild MS (mean graad 93mmHg). Mod TR. PASP 71mmHg.   Coronary artery disease    a. 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   Essential hypertension    Ischemic cardiomyopathy    a. 03/2015 s/p MDT single lead AICD;  b. 10/2015 Echo: EF 20-25%;  c. 12/2017 Echo: EF 20-25%, diff HK.   Mass of upper lobe of right lung    a. noted on CXR & CT 08/2017 w/ abnl PET CT.   Mitral valve disease    a. 2006 s/p MVR w/ 40mm SJM bileaflet mechanical valve-->chronic coumadin/ASA; b. 12/2017 Echo: Mild MS. Mean gradient 44mmHg.    Personal history of radiation therapy    f/u lung ca    Medications:  (Not in a hospital admission)  Assessment: Pharmacy consulted to dose heparin in this 60 year old female admitted with ACS/NSTEMI.  Pt was on warfarin 7.5 mg PO daily at home for mechanical heart valve/A Fib.    CrCl = 35.9 ml/min 9/18 @ 0004 INR = 2.4   Goal of Therapy:  Heparin level 0.3-0.7 units/ml Monitor platelets by anticoagulation protocol: Yes   Plan:  Heparin 3900 units IV X 1 bolus ordered followed by heparin gtt to start at 900 units/hr. Will draw HL 6 hrs after start of drip.  Will check CBC/HL daily.   Vanessa Carlson D 05/27/2021,4:04 AM

## 2021-05-27 NOTE — ED Notes (Signed)
Pt up to bedside commode

## 2021-05-27 NOTE — H&P (Signed)
[place]   PATIENT NAME: Vanessa Carlson    MR#:  [id number]  DATE OF BIRTH:  Oct 02, 1960  DATE OF ADMISSION:  05/27/2021  PRIMARY CARE PHYSICIAN: Virginia Crews, MD   Patient is coming from: Home  REQUESTING/REFERRING PHYSICIAN: Hinda Kehr, MD  CHIEF COMPLAINT:   Chief Complaint  Patient presents with  . Fever    HISTORY OF PRESENT ILLNESS:  Vanessa Carlson is a 60 y.o. female with medical history significant for multiple medical problems that are mentioned below including combined systolic and diastolic CHF, ischemic cardiomyopathy status post AICD, right upper lobe lung cancer status post radiotherapy over 20 years ago, essential hypertension and coronary artery disease, who presented to the ER with acute onset of fever and chills since yesterday with associated urinary frequency and urgency as well as left back pain with flank pain.  She admits to nausea without vomiting or diarrhea.  No chest pain or palpitations.  No cough or wheezing or dyspnea.  No headache or dizziness or blurred vision.  ED Course: When she came to the ER temperature was 103.2 with a heart rate of 120 and respiratory rate of 32 approximately was 88% on room air and 98% on 2 L O2 by nasal cannula.  BP later on was 88/58.  Labs revealed a creatinine 1.3 and total bili of 1.8 with otherwise unremarkable CMP.  High-sensitivity troponin I was 21 and later 127.  Lactic acid was 3.6 and later 2.9 and procalcitonin 11.49.  CBC showed mild leukopenia of 3.5 and thrombocytopenia of 131.  Influenza antigens and COVID-19 PCR came back negative.  UA was positive for UTI.  Urine and blood cultures were sent.    EKG as reviewed by me : EKG showed sinus tachycardia with a rate of 115 with right axis deviation Imaging: Portable chest x-ray showed cardiomegaly and pulmonary vascular congestion without overt pulmonary edema.  The patient was given 2 g of IV Rocephin, 2 L of IV lactated ringer and 650 mg p.o.  Tylenol.  She will be admitted to a medical monitored bed for further evaluation and management. PAST MEDICAL HISTORY:   Past Medical History:  Diagnosis Date  . AICD (automatic cardioverter/defibrillator) present   . Anterior myocardial infarction [facility])    a. 10/2014 - occluded LAD, complicated by cardiogenic shock-->Med Rx as interventional team was unable to open LAD.  [name] Cancer of upper lobe of right lung ([place]) 09/2017   radiation therapy right side  . Cervical cancer ([place])    in remission for ~20 yrs, s/p hysterectomy  . Chronic combined systolic and diastolic CHF (congestive heart failure) ([place])    a. 10/2015 Echo: EF 20-25%, sev diast dysfxn, mildly reduced RV fxn. Nl MV prosthesis; b. 12/2017 Echo: EF 20-25%, diff HK. Sev ant/antsept HK, apical AK. Mild MS (mean graad 62mmHg). Mod TR. PASP 66mmHg.  [name] Coronary artery disease    a. 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  . Essential hypertension   . Ischemic cardiomyopathy    a. 03/2015 s/p MDT single lead AICD;  b. 10/2015 Echo: EF 20-25%;  c. 12/2017 Echo: EF 20-25%, diff HK.  . Mass of upper lobe of right lung    a. noted on CXR & CT 08/2017 w/ abnl PET CT.  [name] Mitral valve disease    a. 2006 s/p MVR w/ 60mm SJM bileaflet mechanical valve-->chronic coumadin/ASA; b. 12/2017 Echo:  Mild MS. Mean gradient 74mmHg.  [name] Personal history of radiation therapy    f/u lung ca    PAST SURGICAL HISTORY:   Past Surgical History:  Procedure Laterality Date  . APPENDECTOMY  1998  . BREAST EXCISIONAL BIOPSY Left 80s   benign  . CARDIAC CATHETERIZATION    . CARDIAC DEFIBRILLATOR PLACEMENT    . COLONOSCOPY WITH PROPOFOL N/A 01/11/2020   Procedure: COLONOSCOPY WITH PROPOFOL;  Surgeon: Jonathon Bellows, MD;  Location: [facility] ENDOSCOPY;  Service: Gastroenterology;  Laterality: N/A;  . RADICAL HYSTERECTOMY  1998  . SHOULDER SURGERY Bilateral    rotator cuff tears  .  VALVE REPLACEMENT     Mitral valve; 27 mm [employer] bileaflet valve    SOCIAL HISTORY:   Social History   Tobacco Use  . Smoking status: Former    Packs/day: 1.00    Years: 4.00    Pack years: 4.00    Types: Cigarettes    Quit date: 1998    Years since quitting: 24.7  . Smokeless tobacco: Never  Substance Use Topics  . Alcohol use: No    FAMILY HISTORY:   Family History  Problem Relation Age of Onset  . Heart attack Mother   . Hypertension Mother   . Diabetes Mother   . Emphysema Father        smoker  . Diabetes Maternal Grandmother   . Kidney disease Maternal Grandmother   . Breast cancer Maternal Aunt   . Breast cancer Maternal Aunt   . Colon cancer Maternal Uncle     DRUG ALLERGIES:  No Known Allergies  REVIEW OF SYSTEMS:   ROS As per history of present illness. All pertinent systems were reviewed above. Constitutional, HEENT, cardiovascular, respiratory, GI, GU, musculoskeletal, neuro, psychiatric, endocrine, integumentary and hematologic systems were reviewed and are otherwise negative/unremarkable except for positive findings mentioned above in the HPI.   MEDICATIONS AT HOME:   Prior to Admission medications   Medication Sig Start Date End Date Taking? Authorizing Provider  aspirin 81 MG chewable tablet Chew 81 mg by mouth daily.   Yes [provider]  atorvastatin (LIPITOR) 80 MG tablet TAKE 1 TABLET BY MOUTH ONCE DAILY. OFFICE VISIT AND LABS NEEDED FOR FURTHER REFILLS. 04/25/21  Yes Bacigalupo, Dionne Bucy, MD  cyclobenzaprine (FLEXERIL) 5 MG tablet Take 1 tablet (5 mg total) by mouth 3 (three) times daily as needed for muscle spasms. 12/07/19  Yes Mar Daring, PA-C  ENTRESTO 24-26 MG Take 1 tablet by mouth twice daily 05/15/21  Yes End, Harrell Gave, MD  ezetimibe (ZETIA) 10 MG tablet Take 1 tablet (10 mg total) by mouth daily. 02/27/21 02/22/22 Yes End, Harrell Gave, MD  fluticasone (FLONASE) 50 MCG/ACT nasal spray Place 2 sprays into both  nostrils as needed for allergies or rhinitis.   Yes [provider]  furosemide (LASIX) 40 MG tablet Take 2 tablets by mouth twice daily 03/23/21  Yes End, Harrell Gave, MD  gabapentin (NEURONTIN) 300 MG capsule Take 1 capsule (300 mg total) by mouth 2 (two) times daily. 04/28/20  Yes Mar Daring, PA-C  metoprolol succinate (TOPROL-XL) 25 MG 24 hr tablet Take 1 tablet (25 mg total) by mouth in the morning and at bedtime. Take with or immediately following a meal. 11/19/20  Yes End, Harrell Gave, MD  spironolactone (ALDACTONE) 25 MG tablet TAKE 1 TABLET BY MOUTH ONCE DAILY . APPOINTMENT REQUIRED FOR FUTURE REFILLS 05/24/21  Yes Bacigalupo, Dionne Bucy, MD  traMADol (ULTRAM) 50 MG tablet TAKE 1 TABLET  BY MOUTH EVERY 6 HOURS AS NEEDED 05/07/21  Yes Bacigalupo, Dionne Bucy, MD  traZODone (DESYREL) 50 MG tablet Take 0.5-1 tablets (25-50 mg total) by mouth at bedtime as needed for sleep. 12/07/17  Yes Bacigalupo, Dionne Bucy, MD  warfarin (COUMADIN) 10 MG tablet Take 5 mg by mouth daily. Takes on Saturdays   Yes [provider]  acetaminophen (TYLENOL) 325 MG tablet Take 650 mg by mouth every 6 (six) hours as needed.    [provider]  nitroGLYCERIN (NITROSTAT) 0.3 MG SL tablet Place 1 tablet (0.3 mg total) under the tongue every 5 (five) minutes as needed for chest pain. 12/28/18   Alisa Graff, FNP  warfarin (COUMADIN) 7.5 MG tablet Take 1 tablet (7.5 mg total) by mouth daily. Patient taking differently: Take 7.5 mg by mouth daily. Sunday- Friday 04/07/20   Virginia Crews, MD      VITAL SIGNS:  Blood pressure (!) 88/58, pulse (!) 109, temperature (!) 102.4 F (39.1 C), temperature source Oral, resp. rate (!) 21, weight 102.1 kg, SpO2 99 %.  PHYSICAL EXAMINATION:  Physical Exam  GENERAL:  60 y.o.-year-old African-American female patient lying in the bed with no acute distress.  EYES: Pupils equal, round, reactive to light and accommodation. No scleral icterus. Extraocular  muscles intact.  HEENT: Head atraumatic, normocephalic. Oropharynx and nasopharynx clear.  NECK:  Supple, no jugular venous distention. No thyroid enlargement, no tenderness.  LUNGS: Normal breath sounds bilaterally, no wheezing, rales,rhonchi or crepitation. No use of accessory muscles of respiration.  CARDIOVASCULAR: Regular rate and rhythm, S1, S2 normal. No murmurs, rubs, or gallops.  No CVA tenderness bilaterally. ABDOMEN: Soft, nondistended, nontender. Bowel sounds present. No organomegaly or mass.  EXTREMITIES: No pedal edema, cyanosis, or clubbing.  NEUROLOGIC: Cranial nerves II through XII are intact. Muscle strength 5/5 in all extremities. Sensation intact. Gait not checked.  PSYCHIATRIC: The patient is alert and oriented x 3.  Normal affect and good eye contact. SKIN: No obvious rash, lesion, or ulcer.   LABORATORY PANEL:   CBC Recent Labs  Lab 05/27/21 0004  WBC 3.5*  HGB 12.6  HCT 38.3  PLT 131*   ------------------------------------------------------------------------------------------------------------------  Chemistries  Recent Labs  Lab 05/27/21 0004  NA 138  K 3.6  CL 102  CO2 26  GLUCOSE 146*  BUN 16  CREATININE 1.30*  CALCIUM 8.7*  AST 33  ALT 20  ALKPHOS 93  BILITOT 1.8*   ------------------------------------------------------------------------------------------------------------------  Cardiac Enzymes No results for input(s): TROPONINI in the last 168 hours. ------------------------------------------------------------------------------------------------------------------  RADIOLOGY:  DG Chest Port 1 View  Result Date: 05/27/2021 CLINICAL DATA:  Sepsis EXAM: PORTABLE CHEST 1 VIEW COMPARISON:  12/23/2020 FINDINGS: Mild cardiomegaly. Unchanged appearance of valvuloplasty, left chest wall AICD and median sternotomy wires. Normal pleural spaces. Mild pulmonary vascular congestion without overt pulmonary edema. No focal airspace consolidation.  IMPRESSION: Cardiomegaly and pulmonary vascular congestion without overt pulmonary edema. Electronically Signed   By: Ulyses Jarred M.D.   On: 05/27/2021 01:45      IMPRESSION AND PLAN:  Active Problems:   Sepsis due to gram-negative UTI ([place])  1.  Sepsis due to UTI.  Sepsis manifested by fever, tachycardia and tachypnea and leukopenia.  She meets severe sepsis criteria given elevated lactic acid of 3.6 as well as hypotension. - The patient will be admitted to a medical monitored bed. - We will continue antibiotic therapy with IV Rocephin. - We will follow blood and urine cultures. - We will follow lactic acid  level and procalcitonin.  2.  Elevated troponin I, likely secondary to demand ischemia.  She is chest pain-free.  Differential diagnosis includes non-STEMI. - We will follow serial troponin I's. - Patient is on high-dose statin therapy and aspirin we will place him on as needed sublingual nitroglycerin and morphine sulfate only if needed. - We will place the patient on IV heparin for now and hold off Coumadin.  3.  Combined systolic and diastolic CHF without exacerbation. - We will continue Entresto and Toprol-XL as well as diuretic therapy.  4.  Dyslipidemia. - We will continue statin therapy and Zetia.  5.  Essential hypertension. - We will continue his antihypertensives.   DVT prophylaxis: Lovenox/.  Code Status: full code. Family Communication:  The plan of care was discussed in details with the patient (and family). I answered all questions. The patient agreed to proceed with the above mentioned plan. Further management will depend upon hospital course. Disposition Plan: Back to previous home environment Consults called: none. All the records are reviewed and case discussed with ED provider.  Status is: Inpatient  Remains inpatient appropriate because:Ongoing active pain requiring inpatient pain management, Ongoing diagnostic testing needed not appropriate for  outpatient work up, Unsafe d/c plan, IV treatments appropriate due to intensity of illness or inability to take PO, and Inpatient level of care appropriate due to severity of illness  Dispo: The patient is from: Home              Anticipated d/c is to: Home              Patient currently is not medically stable to d/c.   Difficult to place patient No   TOTAL TIME TAKING CARE OF THIS PATIENT: 55 minutes.    Christel Mormon M.D on 05/27/2021 at 5:16 AM  [facility]   From 7 PM-7 AM, contact night-coverage [email or web address]  CC: Primary care physician; Virginia Crews, MD

## 2021-05-27 NOTE — Progress Notes (Addendum)
PHARMACY - PHYSICIAN COMMUNICATION CRITICAL VALUE ALERT - BLOOD CULTURE IDENTIFICATION (BCID)  Vanessa Carlson is an 60 y.o. female who presented to [facility] on 05/27/2021 with a chief complaint of fevers and AMS.  Assessment:  patient with suspected UTI.  Blood cultures with GNR in 1/2 sets currently, BCID = E coli (no resistance detected)  Name of physician (or Provider) Contacted: Dr Manuella Ghazi  Current antibiotics: ceftriaxone  Changes to prescribed antibiotics recommended:  Patient is on recommended antibiotics - No changes needed  Results for orders placed or performed during the hospital encounter of 05/27/21  Blood Culture ID Panel (Reflexed) (Collected: 05/27/2021 12:05 AM)  Result Value Ref Range   Enterococcus faecalis NOT DETECTED NOT DETECTED   Enterococcus Faecium NOT DETECTED NOT DETECTED   Listeria monocytogenes NOT DETECTED NOT DETECTED   Staphylococcus species NOT DETECTED NOT DETECTED   Staphylococcus aureus (BCID) NOT DETECTED NOT DETECTED   Staphylococcus epidermidis NOT DETECTED NOT DETECTED   Staphylococcus lugdunensis NOT DETECTED NOT DETECTED   Streptococcus species NOT DETECTED NOT DETECTED   Streptococcus agalactiae NOT DETECTED NOT DETECTED   Streptococcus pneumoniae NOT DETECTED NOT DETECTED   Streptococcus pyogenes NOT DETECTED NOT DETECTED   A.calcoaceticus-baumannii NOT DETECTED NOT DETECTED   Bacteroides fragilis NOT DETECTED NOT DETECTED   Enterobacterales DETECTED (A) NOT DETECTED   Enterobacter cloacae complex NOT DETECTED NOT DETECTED   Escherichia coli DETECTED (A) NOT DETECTED   Klebsiella aerogenes NOT DETECTED NOT DETECTED   Klebsiella oxytoca NOT DETECTED NOT DETECTED   Klebsiella pneumoniae NOT DETECTED NOT DETECTED   Proteus species NOT DETECTED NOT DETECTED   Salmonella species NOT DETECTED NOT DETECTED   Serratia marcescens NOT DETECTED NOT DETECTED   Haemophilus influenzae NOT DETECTED NOT DETECTED   Neisseria meningitidis NOT  DETECTED NOT DETECTED   Pseudomonas aeruginosa NOT DETECTED NOT DETECTED   Stenotrophomonas maltophilia NOT DETECTED NOT DETECTED   Candida albicans NOT DETECTED NOT DETECTED   Candida auris NOT DETECTED NOT DETECTED   Candida glabrata NOT DETECTED NOT DETECTED   Candida krusei NOT DETECTED NOT DETECTED   Candida parapsilosis NOT DETECTED NOT DETECTED   Candida tropicalis NOT DETECTED NOT DETECTED   Cryptococcus neoformans/gattii NOT DETECTED NOT DETECTED   CTX-M ESBL NOT DETECTED NOT DETECTED   Carbapenem resistance IMP NOT DETECTED NOT DETECTED   Carbapenem resistance KPC NOT DETECTED NOT DETECTED   Carbapenem resistance NDM NOT DETECTED NOT DETECTED   Carbapenem resist OXA 48 LIKE NOT DETECTED NOT DETECTED   Carbapenem resistance VIM NOT DETECTED NOT DETECTED    Doreene Eland, PharmD, BCPS.   Work Cell: [phone] 05/27/2021 1:21 PM

## 2021-05-27 NOTE — Progress Notes (Signed)
ANTICOAGULATION CONSULT NOTE - FOLLOW UP Pharmacy Consult for Heparin  Indication: chest pain/ACS  No Known Allergies  Patient Measurements: Weight: 102.1 kg (225 lb) Heparin Dosing Weight: 65.5 kg   Vital Signs: Temp: 99.2 F (37.3 C) (09/28 0632) Temp Source: Oral (09/28 8309) BP: 96/49 (09/28 1200) Pulse Rate: 89 (09/28 1200)  Labs: Recent Labs    05/27/21 0004 05/27/21 0225 05/27/21 0525 05/27/21 1131  HGB 12.6  --  10.9*  --   HCT 38.3  --  33.1*  --   PLT 131*  --  129*  --   APTT 41*  --   --   --   LABPROT 26.0*  --  24.3*  --   INR 2.4*  --  2.2*  --   HEPARINUNFRC  --   --   --  0.43  CREATININE 1.30*  --  1.39*  --   TROPONINIHS 21* 127*  --   --     Estimated Creatinine Clearance: 47.6 mL/min (A) (by C-G formula based on SCr of 1.39 mg/dL (H)).   Medical History: Past Medical History:  Diagnosis Date   AICD (automatic cardioverter/defibrillator) present    Anterior myocardial infarction [facility])    a. 10/2014 - occluded LAD, complicated by cardiogenic shock-->Med Rx as interventional team was unable to open LAD.   Cancer of upper lobe of right lung ([place]) 09/2017   radiation therapy right side   Cervical cancer ([place])    in remission for ~20 yrs, s/p hysterectomy   Chronic combined systolic and diastolic CHF (congestive heart failure) ([place])    a. 10/2015 Echo: EF 20-25%, sev diast dysfxn, mildly reduced RV fxn. Nl MV prosthesis; b. 12/2017 Echo: EF 20-25%, diff HK. Sev ant/antsept HK, apical AK. Mild MS (mean graad 36mmHg). Mod TR. PASP 28mmHg.   Coronary artery disease    a. 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   Essential hypertension    Ischemic cardiomyopathy    a. 03/2015 s/p MDT single lead AICD;  b. 10/2015 Echo: EF 20-25%;  c. 12/2017 Echo: EF 20-25%, diff HK.   Mass of upper lobe of right lung    a. noted on CXR & CT 08/2017 w/ abnl PET CT.   Mitral valve  disease    a. 2006 s/p MVR w/ 71mm SJM bileaflet mechanical valve-->chronic coumadin/ASA; b. 12/2017 Echo: Mild MS. Mean gradient 57mmHg.   Personal history of radiation therapy    f/u lung ca    Assessment: Pharmacy consulted to dose heparin in this 60 year old female admitted with ACS/NSTEMI.  Pt was on warfarin 7.5 mg PO daily at home for mechanical heart valve/A Fib.     CrCl = 35.9 ml/min   Goal of Therapy:  Heparin level 0.3-0.7 units/ml Monitor platelets by anticoagulation protocol: Yes  9/28 @ 1151 HL 0.43, therapeutic x 1 (INR = 2.2)   Plan:  Continue heparin infusion at 900 units/hr. Will draw HL 6 hrs to confirm therapeutic level Will check CBC/HL daily while on heparin drip  Omari Koslosky Rodriguez-Guzman PharmD, BCPS 05/27/2021 12:11 PM

## 2021-05-27 NOTE — Sepsis Progress Note (Signed)
Monitoring for code sepsis protocol. 

## 2021-05-27 NOTE — Progress Notes (Signed)
ANTICOAGULATION CONSULT NOTE - FOLLOW UP Pharmacy Consult for Heparin  Indication: chest pain/ACS  No Known Allergies  Patient Measurements: Weight: 102.1 kg (225 lb) Heparin Dosing Weight: 65.5 kg   Vital Signs: BP: 108/60 (09/28 1711) Pulse Rate: 104 (09/28 1711)  Labs: Recent Labs    05/27/21 0004 05/27/21 0225 05/27/21 0525 05/27/21 1131 05/27/21 1859  HGB 12.6  --  10.9*  --   --   HCT 38.3  --  33.1*  --   --   PLT 131*  --  129*  --   --   APTT 41*  --   --   --   --   LABPROT 26.0*  --  24.3*  --   --   INR 2.4*  --  2.2*  --   --   HEPARINUNFRC  --   --   --  0.43 0.30  CREATININE 1.30*  --  1.39*  --   --   TROPONINIHS 21* 127*  --   --   --     Estimated Creatinine Clearance: 47.6 mL/min (A) (by C-G formula based on SCr of 1.39 mg/dL (H)).   Medical History: Past Medical History:  Diagnosis Date   AICD (automatic cardioverter/defibrillator) present    Anterior myocardial infarction [facility])    a. 10/2014 - occluded LAD, complicated by cardiogenic shock-->Med Rx as interventional team was unable to open LAD.   Cancer of upper lobe of right lung ([place]) 09/2017   radiation therapy right side   Cervical cancer ([place])    in remission for ~20 yrs, s/p hysterectomy   Chronic combined systolic and diastolic CHF (congestive heart failure) ([place])    a. 10/2015 Echo: EF 20-25%, sev diast dysfxn, mildly reduced RV fxn. Nl MV prosthesis; b. 12/2017 Echo: EF 20-25%, diff HK. Sev ant/antsept HK, apical AK. Mild MS (mean graad 32mmHg). Mod TR. PASP 7mmHg.   Coronary artery disease    a. 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   Essential hypertension    Ischemic cardiomyopathy    a. 03/2015 s/p MDT single lead AICD;  b. 10/2015 Echo: EF 20-25%;  c. 12/2017 Echo: EF 20-25%, diff HK.   Mass of upper lobe of right lung    a. noted on CXR & CT 08/2017 w/ abnl PET CT.   Mitral valve disease     a. 2006 s/p MVR w/ 76mm SJM bileaflet mechanical valve-->chronic coumadin/ASA; b. 12/2017 Echo: Mild MS. Mean gradient 75mmHg.   Personal history of radiation therapy    f/u lung ca    Assessment: 60yo Female with h/o mixed dia/sysCHF, iCMP (s/p AICD), RUL lung CAN (s/p RAD Tx over 20 years ago), 1 HTN and CAD, presenting with new fever and chills since yesterday with associated urinary frequency and urgency as well as left back pain with flank pain. Troponins were elevated and pharmacy consulted to dose heparin in this during r/o for ACS/NSTEMI. Pt was on warfarin 7.5 mg PO daily at home for mechanical heart valve/A Fib.    Date Time  HL  Rate/Comment 9/28 1151 0.43  Therapeutic;  (INR = 2.2)  9/28 1859 0.30  Therapeutic; 900 > 1000 un/hr  Baseline Labs: aPTT - 41s INR - 2.4>2.2 Hgb - 12.6>10.9 Plts - 131>129  Goal of Therapy:  Heparin level 0.3-0.7 units/ml Monitor platelets by anticoagulation protocol: Yes   Plan:  HL is therapeutic at 0.3 (goal 0.3-0.7)  but borderline with underlying indication for mech'l valve, Afib, ACS r/o. Will increase rate modestly w/o bolus. Increase heparin infusion to 1000 units/hr Check anti-Xa level in 6 hours and daily while on heparin Continue to monitor H&H and platelets  Lorna Dibble, PharmD, [place] Clinical pharmacist 05/27/2021 8:20 PM

## 2021-05-27 NOTE — ED Notes (Signed)
Caregiver of pt called for update. Pt gave verbal permission to update caregiver. Caregiver updated at this time and all questions answered.

## 2021-05-27 NOTE — Progress Notes (Signed)
CODE SEPSIS - PHARMACY COMMUNICATION  **Broad Spectrum Antibiotics should be administered within 1 hour of Sepsis diagnosis**  Time Code Sepsis Called/Page Received: 9/27 @ 2356  Antibiotics Ordered: Ceftriaxone 2 gm   Time of 1st antibiotic administration: Ceftriaxone 2 gm IV X 1 on 9/28 @ 0052  Additional action taken by pharmacy:   If necessary, Name of Provider/Nurse Contacted:     Sheryl Saintil D ,PharmD Clinical Pharmacist  05/27/2021  1:56 AM

## 2021-05-27 NOTE — ED Notes (Signed)
Pt given meal tray.

## 2021-05-27 NOTE — Progress Notes (Signed)
ANTICOAGULATION CONSULT NOTE - Initial Consult  Pharmacy Consult for Heparin  Indication: chest pain/ACS  No Known Allergies  Patient Measurements: Weight: 59 kg (130 lb) Heparin Dosing Weight: 65.5 kg   Vital Signs: Temp: 102.4 F (39.1 C) (09/28 0318) Temp Source: Oral (09/28 0318) BP: 96/59 (09/28 0300) Pulse Rate: 113 (09/28 0300)  Labs: Recent Labs    05/27/21 0004 05/27/21 0225  HGB 12.6  --   HCT 38.3  --   PLT 131*  --   APTT 41*  --   LABPROT 26.0*  --   INR 2.4*  --   CREATININE 1.30*  --   TROPONINIHS 21* 127*    Estimated Creatinine Clearance: 35.9 mL/min (A) (by C-G formula based on SCr of 1.3 mg/dL (H)).   Medical History: Past Medical History:  Diagnosis Date   AICD (automatic cardioverter/defibrillator) present    Anterior myocardial infarction [facility])    a. 10/2014 - occluded LAD, complicated by cardiogenic shock-->Med Rx as interventional team was unable to open LAD.   Cancer of upper lobe of right lung ([place]) 09/2017   radiation therapy right side   Cervical cancer ([place])    in remission for ~20 yrs, s/p hysterectomy   Chronic combined systolic and diastolic CHF (congestive heart failure) ([place])    a. 10/2015 Echo: EF 20-25%, sev diast dysfxn, mildly reduced RV fxn. Nl MV prosthesis; b. 12/2017 Echo: EF 20-25%, diff HK. Sev ant/antsept HK, apical AK. Mild MS (mean graad 60mmHg). Mod TR. PASP 25mmHg.   Coronary artery disease    a. 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   Essential hypertension    Ischemic cardiomyopathy    a. 03/2015 s/p MDT single lead AICD;  b. 10/2015 Echo: EF 20-25%;  c. 12/2017 Echo: EF 20-25%, diff HK.   Mass of upper lobe of right lung    a. noted on CXR & CT 08/2017 w/ abnl PET CT.   Mitral valve disease    a. 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 12/2017 Echo: Mild MS. Mean gradient 17mmHg.   Personal  history of radiation therapy    f/u lung ca    Medications:  (Not in a hospital admission)   Assessment: Pharmacy consulted to dose heparin in this 60 year old female admitted with ACS/NSTEMI.  Pt was on warfarin 7.5 mg PO daily at home for mechanical heart valve/A Fib.    CrCl = 35.9 ml/min 9/18 @ 0004 INR = 2.4   Goal of Therapy:  Heparin level 0.3-0.7 units/ml Monitor platelets by anticoagulation protocol: Yes   Plan:  Heparin 3900 units IV X 1 bolus ordered followed by heparin gtt to start at 750 units/hr. Will draw HL 6 hrs after start of drip.  Will check CBC/HL daily.   Kendrell Lottman D 05/27/2021,3:46 AM

## 2021-05-27 NOTE — Progress Notes (Signed)
Same-day rounding progress note  Patient seen and examined while in the ED.  Husband at bedside.  E. coli sepsis/bacteremia Due to urinary source Continue IV ceftriaxone  Time spent: 20 minutes

## 2021-05-27 NOTE — ED Notes (Signed)
Lab called at this time to check on status of them obtaining pt's admission labs   Per chuck he is on way

## 2021-05-28 ENCOUNTER — Encounter: Payer: Self-pay | Admitting: Family Medicine

## 2021-05-28 DIAGNOSIS — A4151 Sepsis due to Escherichia coli [E. coli]: Secondary | ICD-10-CM | POA: Diagnosis not present

## 2021-05-28 DIAGNOSIS — R7881 Bacteremia: Secondary | ICD-10-CM

## 2021-05-28 DIAGNOSIS — B9620 Unspecified Escherichia coli [E. coli] as the cause of diseases classified elsewhere: Secondary | ICD-10-CM | POA: Diagnosis not present

## 2021-05-28 DIAGNOSIS — R6520 Severe sepsis without septic shock: Secondary | ICD-10-CM

## 2021-05-28 DIAGNOSIS — A419 Sepsis, unspecified organism: Secondary | ICD-10-CM

## 2021-05-28 DIAGNOSIS — I480 Paroxysmal atrial fibrillation: Secondary | ICD-10-CM

## 2021-05-28 DIAGNOSIS — I5022 Chronic systolic (congestive) heart failure: Secondary | ICD-10-CM

## 2021-05-28 LAB — URINE CULTURE: Culture: 70000 — AB

## 2021-05-28 LAB — BASIC METABOLIC PANEL
Anion gap: 9 (ref 5–15)
BUN: 15 mg/dL (ref 8–23)
CO2: 25 mmol/L (ref 22–32)
Calcium: 8.5 mg/dL — ABNORMAL LOW (ref 8.9–10.3)
Chloride: 108 mmol/L (ref 98–111)
Creatinine, Ser: 1.11 mg/dL — ABNORMAL HIGH (ref 0.44–1.00)
GFR, Estimated: 57 mL/min — ABNORMAL LOW (ref >60)
Glucose, Bld: 119 mg/dL — ABNORMAL HIGH (ref 70–99)
Potassium: 3.5 mmol/L (ref 3.5–5.1)
Sodium: 142 mmol/L (ref 135–145)

## 2021-05-28 LAB — HEPARIN LEVEL (UNFRACTIONATED)
Heparin Unfractionated: 0.28 IU/mL — ABNORMAL LOW (ref 0.30–0.70)
Heparin Unfractionated: 0.31 IU/mL (ref 0.30–0.70)
Heparin Unfractionated: 0.27 IU/mL — ABNORMAL LOW (ref 0.30–0.70)

## 2021-05-28 LAB — PROTIME-INR
INR: 1.5 — ABNORMAL HIGH (ref 0.8–1.2)
Prothrombin Time: 18.1 seconds — ABNORMAL HIGH (ref 11.4–15.2)

## 2021-05-28 LAB — CBC
HCT: 34.1 % — ABNORMAL LOW (ref 36.0–46.0)
Hemoglobin: 11.2 g/dL — ABNORMAL LOW (ref 12.0–15.0)
MCH: 30.2 pg (ref 26.0–34.0)
MCHC: 32.8 g/dL (ref 30.0–36.0)
MCV: 91.9 fL (ref 80.0–100.0)
Platelets: 118 10*3/uL — ABNORMAL LOW (ref 150–400)
RBC: 3.71 MIL/uL — ABNORMAL LOW (ref 3.87–5.11)
RDW: 16 % — ABNORMAL HIGH (ref 11.5–15.5)
WBC: 17.8 10*3/uL — ABNORMAL HIGH (ref 4.0–10.5)
nRBC: 0 % (ref 0.0–0.2)

## 2021-05-28 LAB — LACTIC ACID, PLASMA: Lactic Acid, Venous: 1.6 mmol/L (ref 0.5–1.9)

## 2021-05-28 LAB — PROCALCITONIN: Procalcitonin: 38.8 ng/mL

## 2021-05-28 MED ORDER — INFLUENZA VAC SPLIT QUAD 0.5 ML IM SUSY
0.5000 mL | PREFILLED_SYRINGE | INTRAMUSCULAR | Status: AC
  Administered 2021-05-29: 0.5 mL via INTRAMUSCULAR
  Filled 2021-05-28 (×2): qty 0.5

## 2021-05-28 MED ORDER — METOPROLOL TARTRATE 5 MG/5ML IV SOLN
2.5000 mg | Freq: Once | INTRAVENOUS | Status: AC
  Administered 2021-05-28: 2.5 mg via INTRAVENOUS
  Filled 2021-05-28: qty 5

## 2021-05-28 MED ORDER — HEPARIN (PORCINE) 25000 UT/250ML-% IV SOLN
1450.0000 [IU]/h | INTRAVENOUS | Status: DC
  Administered 2021-05-28: 1200 [IU]/h via INTRAVENOUS
  Administered 2021-05-29 – 2021-05-30 (×2): 1450 [IU]/h via INTRAVENOUS
  Filled 2021-05-28 (×3): qty 250

## 2021-05-28 MED ORDER — WARFARIN - PHARMACIST DOSING INPATIENT
Freq: Every day | Status: DC
  Filled 2021-05-28: qty 1

## 2021-05-28 MED ORDER — LABETALOL HCL 5 MG/ML IV SOLN: 10.0000 mg | INTRAVENOUS | Status: DC | PRN

## 2021-05-28 MED ORDER — HEPARIN BOLUS VIA INFUSION
4000.0000 [IU] | Freq: Once | INTRAVENOUS | Status: AC
  Administered 2021-05-28: 4000 [IU] via INTRAVENOUS
  Filled 2021-05-28: qty 4000

## 2021-05-28 MED ORDER — WARFARIN SODIUM 10 MG PO TABS
10.0000 mg | ORAL_TABLET | Freq: Once | ORAL | Status: AC
  Administered 2021-05-28: 10 mg via ORAL
  Filled 2021-05-28: qty 1

## 2021-05-28 MED ORDER — HEPARIN BOLUS VIA INFUSION
1000.0000 [IU] | Freq: Once | INTRAVENOUS | Status: AC
  Administered 2021-05-28: 1000 [IU] via INTRAVENOUS
  Filled 2021-05-28: qty 1000

## 2021-05-28 NOTE — Assessment & Plan Note (Addendum)
Well compensated at this time.  Continue aspirin and statin.  Unable to give metoprolol due to soft blood pressure.  This can be resumed along with Lasix as blood pressure can tolerate

## 2021-05-28 NOTE — Progress Notes (Signed)
ANTICOAGULATION CONSULT NOTE - FOLLOW UP Pharmacy Consult for Heparin  Indication: chest pain/ACS  No Known Allergies  Patient Measurements: Weight: 102.1 kg (225 lb) Heparin Dosing Weight: 65.5 kg   Vital Signs: Temp: 99.2 F (37.3 C) (09/29 0130) Temp Source: Oral (09/29 0130) BP: 118/65 (09/29 0130) Pulse Rate: 117 (09/29 0208)  Labs: Recent Labs    05/27/21 0004 05/27/21 0225 05/27/21 0525 05/27/21 1131 05/27/21 1859 05/28/21 0317  HGB 12.6  --  10.9*  --   --  11.2*  HCT 38.3  --  33.1*  --   --  34.1*  PLT 131*  --  129*  --   --  118*  APTT 41*  --   --   --   --   --   LABPROT 26.0*  --  24.3*  --   --   --   INR 2.4*  --  2.2*  --   --   --   HEPARINUNFRC  --   --   --  0.43 0.30 0.28*  CREATININE 1.30*  --  1.39*  --   --   --   TROPONINIHS 21* 127*  --   --   --   --     Estimated Creatinine Clearance: 47.6 mL/min (A) (by C-G formula based on SCr of 1.39 mg/dL (H)).   Medical History: Past Medical History:  Diagnosis Date   AICD (automatic cardioverter/defibrillator) present    Anterior myocardial infarction [facility])    a. 10/2014 - occluded LAD, complicated by cardiogenic shock-->Med Rx as interventional team was unable to open LAD.   Cancer of upper lobe of right lung ([place]) 09/2017   radiation therapy right side   Cervical cancer ([place])    in remission for ~20 yrs, s/p hysterectomy   Chronic combined systolic and diastolic CHF (congestive heart failure) ([place])    a. 10/2015 Echo: EF 20-25%, sev diast dysfxn, mildly reduced RV fxn. Nl MV prosthesis; b. 12/2017 Echo: EF 20-25%, diff HK. Sev ant/antsept HK, apical AK. Mild MS (mean graad 62mmHg). Mod TR. PASP 66mmHg.   Coronary artery disease    a. 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   Essential hypertension    Ischemic cardiomyopathy    a. 03/2015 s/p MDT single lead AICD;  b. 10/2015 Echo: EF 20-25%;  c. 12/2017 Echo:  EF 20-25%, diff HK.   Mass of upper lobe of right lung    a. noted on CXR & CT 08/2017 w/ abnl PET CT.   Mitral valve disease    a. 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 12/2017 Echo: Mild MS. Mean gradient 109mmHg.   Personal history of radiation therapy    f/u lung ca    Assessment: 60yo Female with h/o mixed dia/sysCHF, iCMP (s/p AICD), RUL lung CAN (s/p RAD Tx over 20 years ago), 1 HTN and CAD, presenting with new fever and chills since yesterday with associated urinary frequency and urgency as well as left back pain with flank pain. Troponins were elevated and pharmacy consulted to dose heparin in this during r/o for ACS/NSTEMI. Pt was on warfarin 7.5 mg PO daily at home for mechanical heart valve/A Fib.    Date Time  HL  Rate/Comment 9/28 1151 0.43  Therapeutic;  (INR = 2.2)  9/28 1859 0.30  Therapeutic; 900 > 1000 un/hr 9/29     0317  0.28                Subtherapeutic   Baseline Labs: aPTT - 41s INR - 2.4>2.2 Hgb - 12.6>10.9 Plts - 131>129  Goal of Therapy:  Heparin level 0.3-0.7 units/ml Monitor platelets by anticoagulation protocol: Yes   Plan:  9/29:  HL @ 0317 = 0.28 Will order heparin 1000 units IV X 1 and increase drip rate to 1100 units/hr. Will recheck HL 6 hrs after rate change.   Makynlie Rossini D Clinical pharmacist 05/28/2021 3:40 AM

## 2021-05-28 NOTE — Consult Note (Signed)
NAME: Vanessa Carlson  DOB: [birth year]  MRN: [id number]  Date/Time: 05/28/2021 3:17 PM  REQUESTING PROVIDER: Dr. Manuella Ghazi Subjective:  REASON FOR CONSULT: E.coli sepsis ? Vanessa Carlson is a 60 y.o. 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 Her husband called EMS and she was brought to the ED around midnight of 05/26/21 Pt denies any cough, sob, dysuria, hematuria before the presentation- she had some back pain. No recent antibiotic  No recent steroid injection- last was 3 yrs ago for the rt knee No one else was sinck In the ED Temp 103.2, BP 106/64, HR 120, RR 32 and sats 88% WBC was 12.5, HB 10.9, PLT 129 and cr 1.39 CXR showed cardiomegaly with pulmonary vascular congestion Blood culture sent. UA had 21-50 WBC and culture sent . She was started on ceftriaxone- both cultures came back as e.coli and I am seeing the patient for the same  Past Medical History:  Diagnosis Date   AICD (automatic cardioverter/defibrillator) present    Anterior myocardial infarction [facility])    a. 10/2014 - occluded LAD, complicated by cardiogenic shock-->Med Rx as interventional team was unable to open LAD.   Cancer of upper lobe of right lung ([place]) 09/2017   radiation therapy right side   Cervical cancer ([place])    in remission for ~20 yrs, s/p hysterectomy   Chronic combined systolic and diastolic CHF (congestive heart failure) ([place])    a. 10/2015 Echo: EF 20-25%, sev diast dysfxn, mildly reduced RV fxn. Nl MV prosthesis; b. 12/2017 Echo: EF 20-25%, diff HK. Sev ant/antsept HK, apical AK. Mild MS (mean graad 92mmHg). Mod TR.  PASP 32mmHg.   Coronary artery disease    a. 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   Essential hypertension    Ischemic cardiomyopathy    a. 03/2015 s/p MDT single lead AICD;  b. 10/2015 Echo: EF 20-25%;  c. 12/2017 Echo: EF 20-25%, diff HK.   Mass of upper lobe of right lung    a. noted on CXR & CT 08/2017 w/ abnl PET CT.   Mitral valve disease    a. 2006 s/p MVR w/ 74mm SJM bileaflet mechanical valve-->chronic coumadin/ASA; b. 12/2017 Echo: Mild MS. Mean gradient 2mmHg.   Personal history of radiation therapy    f/u lung ca    Past Surgical History:  Procedure Laterality Date   APPENDECTOMY  1998   BREAST EXCISIONAL BIOPSY Left 80s   benign   CARDIAC CATHETERIZATION     CARDIAC DEFIBRILLATOR PLACEMENT     COLONOSCOPY WITH PROPOFOL N/A 01/11/2020   Procedure: COLONOSCOPY WITH PROPOFOL;  Surgeon: Jonathon Bellows, MD;  Location: [facility] ENDOSCOPY;  Service: Gastroenterology;  Laterality: N/A;   RADICAL HYSTERECTOMY  1998   SHOULDER SURGERY Bilateral    rotator cuff tears   VALVE REPLACEMENT     Mitral valve; 27 mm St. Jude bileaflet valve    Social History   Socioeconomic History   Marital status: Married    Spouse name: Tosin   Number of children: 2   Years of  education: 14   Highest education level: Some college, no degree  Occupational History   Occupation: disable  Tobacco Use   Smoking status: Former    Packs/day: 1.00    Years: 4.00    Pack years: 4.00    Types: Cigarettes    Quit date: 1998    Years since quitting: 24.7   Smokeless tobacco: Never  Vaping Use   Vaping Use: Never used  Substance and Sexual Activity   Alcohol use: No   Drug use: No   Sexual activity: Yes    Partners: Male    Birth control/protection: Surgical  Other Topics Concern   Not on file  Social History Narrative   Not on file   Social Determinants of Health   Financial Resource Strain: Low Risk     Difficulty of Paying Living Expenses: Not hard at all  Food Insecurity: No Food Insecurity   Worried About Charity fundraiser in the Last Year: Never true   [place] in the Last Year: Never true  Transportation Needs: No Transportation Needs   Lack of Transportation (Medical): No   Lack of Transportation (Non-Medical): No  Physical Activity: Inactive   Days of Exercise per Week: 0 days   Minutes of Exercise per Session: 0 min  Stress: No Stress Concern Present   Feeling of Stress : Not at all  Social Connections: Socially Integrated   Frequency of Communication with Friends and Family: More than three times a week   Frequency of Social Gatherings with Friends and Family: More than three times a week   Attends Religious Services: More than 4 times per year   Active Member of [employer] or Organizations: Yes   Attends Music therapist: More than 4 times per year   Marital Status: Married  Human resources officer Violence: Not At Risk   Fear of Current or Ex-Partner: No   Emotionally Abused: No   Physically Abused: No   Sexually Abused: No    Family History  Problem Relation Age of Onset   Heart attack Mother    Hypertension Mother    Diabetes Mother    Emphysema Father        smoker   Diabetes Maternal Grandmother    Kidney disease Maternal Grandmother    Breast cancer Maternal Aunt    Breast cancer Maternal Aunt    Colon cancer Maternal Uncle    No Known Allergies I? Current Facility-Administered Medications  Medication Dose Route Frequency Provider Last Rate Last Admin   0.9 %  sodium chloride infusion   Intravenous Continuous Mansy, Arvella Merles, MD   Stopped at 05/27/21 0752   acetaminophen (TYLENOL) tablet 650 mg  650 mg Oral Q6H PRN Mansy, Jan A, MD   650 mg at 05/27/21 1722   Or   acetaminophen (TYLENOL) suppository 650 mg  650 mg Rectal Q6H PRN Mansy, Jan A, MD       aspirin chewable tablet 81 mg  81 mg Oral Daily Mansy, Jan A, MD   81 mg at 05/28/21 1003    atorvastatin (LIPITOR) tablet 80 mg  80 mg Oral Daily Mansy, Jan A, MD   80 mg at 05/28/21 1003   cefTRIAXone (ROCEPHIN) 2 g in sodium chloride 0.9 % 100 mL IVPB  2 g Intravenous Q24H Hinda Kehr, MD   Stopped at 05/28/21 0107   cyclobenzaprine (FLEXERIL) tablet 5 mg  5 mg Oral TID PRN Mansy, Arvella Merles, MD  ezetimibe (ZETIA) tablet 10 mg  10 mg Oral Daily Mansy, Jan A, MD   10 mg at 05/28/21 1004   fluticasone (FLONASE) 50 MCG/ACT nasal spray 2 spray  2 spray Each Nare PRN Mansy, Jan A, MD       gabapentin (NEURONTIN) capsule 300 mg  300 mg Oral BID Mansy, Jan A, MD   300 mg at 05/28/21 1003   heparin ADULT infusion 100 units/mL (25000 units/225mL)  1,100 Units/hr Intravenous Continuous Max Sane, MD 11 mL/hr at 05/28/21 0405 1,100 Units/hr at 05/28/21 0405   labetalol (NORMODYNE) injection 10 mg  10 mg Intravenous Q2H PRN Sharion Settler, NP       magnesium hydroxide (MILK OF MAGNESIA) suspension 30 mL  30 mL Oral Daily PRN Mansy, Jan A, MD       morphine 2 MG/ML injection 2 mg  2 mg Intravenous Q2H PRN Mansy, Jan A, MD       nitroGLYCERIN (NITROSTAT) SL tablet 0.4 mg  0.4 mg Sublingual Q5 min PRN Mansy, Jan A, MD       ondansetron [facility]) tablet 4 mg  4 mg Oral Q6H PRN Mansy, Jan A, MD       Or   ondansetron [facility]) injection 4 mg  4 mg Intravenous Q6H PRN Mansy, Jan A, MD       traMADol [facility]) tablet 50 mg  50 mg Oral Q6H PRN Mansy, Jan A, MD       traZODone (DESYREL) tablet 25 mg  25 mg Oral QHS PRN Mansy, Arvella Merles, MD       Current Outpatient Medications  Medication Sig Dispense Refill   aspirin 81 MG chewable tablet Chew 81 mg by mouth daily.     atorvastatin (LIPITOR) 80 MG tablet TAKE 1 TABLET BY MOUTH ONCE DAILY. OFFICE VISIT AND LABS NEEDED FOR FURTHER REFILLS. 90 tablet 3   cyclobenzaprine (FLEXERIL) 5 MG tablet Take 1 tablet (5 mg total) by mouth 3 (three) times daily as needed for muscle spasms. 30 tablet 1   ENTRESTO 24-26 MG Take 1 tablet by mouth twice daily 180 tablet 0    ezetimibe (ZETIA) 10 MG tablet Take 1 tablet (10 mg total) by mouth daily. 90 tablet 3   fluticasone (FLONASE) 50 MCG/ACT nasal spray Place 2 sprays into both nostrils as needed for allergies or rhinitis.     furosemide (LASIX) 40 MG tablet Take 2 tablets by mouth twice daily 360 tablet 0   gabapentin (NEURONTIN) 300 MG capsule Take 1 capsule (300 mg total) by mouth 2 (two) times daily. 180 capsule 3   metoprolol succinate (TOPROL-XL) 25 MG 24 hr tablet Take 1 tablet (25 mg total) by mouth in the morning and at bedtime. Take with or immediately following a meal. 180 tablet 1   spironolactone (ALDACTONE) 25 MG tablet TAKE 1 TABLET BY MOUTH ONCE DAILY . APPOINTMENT REQUIRED FOR FUTURE REFILLS 30 tablet 0   traMADol (ULTRAM) 50 MG tablet TAKE 1 TABLET BY MOUTH EVERY 6 HOURS AS NEEDED 30 tablet 1   traZODone (DESYREL) 50 MG tablet Take 0.5-1 tablets (25-50 mg total) by mouth at bedtime as needed for sleep. 30 tablet 3   warfarin (COUMADIN) 10 MG tablet Take 5 mg by mouth daily. Takes on Saturdays     acetaminophen (TYLENOL) 325 MG tablet Take 650 mg by mouth every 6 (six) hours as needed.     nitroGLYCERIN (NITROSTAT) 0.3 MG SL tablet Place 1 tablet (0.3 mg total) under the tongue  every 5 (five) minutes as needed for chest pain. 30 tablet 3   warfarin (COUMADIN) 7.5 MG tablet Take 1 tablet (7.5 mg total) by mouth daily. (Patient taking differently: Take 7.5 mg by mouth daily. Sunday- Friday) 90 tablet 3     Abtx:  Anti-infectives (From admission, onward)    Start     Dose/Rate Route Frequency Ordered Stop   05/27/21 0300  cefTRIAXone (ROCEPHIN) 2 g in sodium chloride 0.9 % 100 mL IVPB  Status:  Discontinued        2 g 200 mL/hr over 30 Minutes Intravenous Every 24 hours 05/27/21 0256 05/27/21 0301   05/27/21 0045  cefTRIAXone (ROCEPHIN) 2 g in sodium chloride 0.9 % 100 mL IVPB        2 g 200 mL/hr over 30 Minutes Intravenous Every 24 hours 05/27/21 0042 06/02/21 2159       REVIEW OF  SYSTEMS:  Const: fever,  chills, negative weight loss Eyes: negative diplopia or visual changes, negative eye pain ENT: negative coryza, negative sore throat Resp: negative cough, hemoptysis, dyspnea Cards: negative for chest pain, palpitations, lower extremity edema GU:  frequency,no  dysuria and hematuria GI: Negative for abdominal pain, diarrhea, bleeding, constipation Skin: negative for rash and pruritus Heme: negative for easy bruising and gum/nose bleeding MS: general weakness Neurolo:altered mental status Psych: negative for feelings of anxiety, depression  Endocrine: negative for thyroid, diabetes Allergy/Immunology- negative for any medication or food allergies ? : Objective:  VITALS:  BP 107/77 (BP Location: Left Arm)   Pulse (!) 104   Temp 98.5 F (36.9 C) (Oral)   Resp 17   Wt 102.1 kg   LMP  (LMP Unknown)   SpO2 98%   BMI 41.15 kg/m  PHYSICAL EXAM:  General: Alert, cooperative, no distress, appears stated age.  Head: Normocephalic, without obvious abnormality, atraumatic. Eyes: Conjunctivae clear, anicteric sclerae. Pupils are equal ENT Nares normal. No drainage or sinus tenderness. Lips, mucosa, and tongue normal. No Thrush, edentulous Neck: Supple, symmetrical, no adenopathy, thyroid: non tender no carotid bruit and no JVD. Back: No CVA tenderness. Lungs: b/l air entry few basal crepts. Heart: Regular rate and rhythm, no murmur, rub or gallop. AICD site okay Abdomen: Soft, non-tender,not distended. Bowel sounds normal. No masses Extremities: atraumatic, no cyanosis. No edema. No clubbing Skin: No rashes or lesions. Or bruising Lymph: Cervical, supraclavicular normal. Neurologic: Grossly non-focal Pertinent Labs Lab Results CBC    Component Value Date/Time   WBC 17.8 (H) 05/28/2021 0317   RBC 3.71 (L) 05/28/2021 0317   HGB 11.2 (L) 05/28/2021 0317   HGB 12.3 08/18/2018 0957   HCT 34.1 (L) 05/28/2021 0317   HCT 38.0 08/18/2018 0957   PLT 118 (L)  05/28/2021 0317   PLT 80 (LL) 08/18/2018 0957   MCV 91.9 05/28/2021 0317   MCV 85 08/18/2018 0957   MCH 30.2 05/28/2021 0317   MCHC 32.8 05/28/2021 0317   RDW 16.0 (H) 05/28/2021 0317   RDW 14.5 08/18/2018 0957   LYMPHSABS 0.9 05/27/2021 0004   MONOABS 0.0 (L) 05/27/2021 0004   EOSABS 0.0 05/27/2021 0004   BASOSABS 0.0 05/27/2021 0004    CMP Latest Ref Rng & Units 05/28/2021 05/27/2021 05/27/2021  Glucose 70 - 99 mg/dL 119(H) 152(H) 146(H)  BUN 8 - 23 mg/dL 15 18 16   Creatinine 0.44 - 1.00 mg/dL 1.11(H) 1.39(H) 1.30(H)  Sodium 135 - 145 mmol/L 142 138 138  Potassium 3.5 - 5.1 mmol/L 3.5 3.0(L) 3.6  Chloride 98 -  111 mmol/L 108 102 102  CO2 22 - 32 mmol/L 25 24 26   Calcium 8.9 - 10.3 mg/dL 8.5(L) 8.4(L) 8.7(L)  Total Protein 6.5 - 8.1 g/dL - - 7.3  Total Bilirubin 0.3 - 1.2 mg/dL - - 1.8(H)  Alkaline Phos 38 - 126 U/L - - 93  AST 15 - 41 U/L - - 33  ALT 0 - 44 U/L - - 20      Microbiology: Recent Results (from the past 240 hour(s))  Resp Panel by RT-PCR (Flu A&B, Covid) Nasopharyngeal Swab     Status: None   Collection Time: 05/27/21 12:04 AM   Specimen: Nasopharyngeal Swab; Nasopharyngeal(NP) swabs in vial transport medium  Result Value Ref Range Status   SARS Coronavirus 2 by RT PCR NEGATIVE NEGATIVE Final    Comment: (NOTE) SARS-CoV-2 target nucleic acids are NOT DETECTED.  The SARS-CoV-2 RNA is generally detectable in upper respiratory specimens during the acute phase of infection. The lowest concentration of SARS-CoV-2 viral copies this assay can detect is 138 copies/mL. A negative result does not preclude SARS-Cov-2 infection and should not be used as the sole basis for treatment or other patient management decisions. A negative result may occur with  improper specimen collection/handling, submission of specimen other than nasopharyngeal swab, presence of viral mutation(s) within the areas targeted by this assay, and inadequate number of viral copies(<138  copies/mL). A negative result must be combined with clinical observations, patient history, and epidemiological information. The expected result is Negative.  Fact Sheet for Patients:  EntrepreneurPulse.com.au  Fact Sheet for Healthcare Providers:  IncredibleEmployment.be  This test is no t yet approved or cleared by the [place] FDA and  has been authorized for detection and/or diagnosis of SARS-CoV-2 by FDA under an Emergency Use Authorization (EUA). This EUA will remain  in effect (meaning this test can be used) for the duration of the COVID-19 declaration under Section 564(b)(1) of the Act, 21 U.S.C.section 360bbb-3(b)(1), unless the authorization is terminated  or revoked sooner.       Influenza A by PCR NEGATIVE NEGATIVE Final   Influenza B by PCR NEGATIVE NEGATIVE Final    Comment: (NOTE) The Xpert Xpress SARS-CoV-2/FLU/RSV plus assay is intended as an aid in the diagnosis of influenza from Nasopharyngeal swab specimens and should not be used as a sole basis for treatment. Nasal washings and aspirates are unacceptable for Xpert Xpress SARS-CoV-2/FLU/RSV testing.  Fact Sheet for Patients: EntrepreneurPulse.com.au  Fact Sheet for Healthcare Providers: IncredibleEmployment.be  This test is not yet approved or cleared by the [place] FDA and has been authorized for detection and/or diagnosis of SARS-CoV-2 by FDA under an Emergency Use Authorization (EUA). This EUA will remain in effect (meaning this test can be used) for the duration of the COVID-19 declaration under Section 564(b)(1) of the Act, 21 U.S.C. section 360bbb-3(b)(1), unless the authorization is terminated or revoked.  Performed at [facility], [address] [place], [place] [address]   Blood Culture (routine x 2)     Status: None (Preliminary result)   Collection Time: 05/27/21 12:05 AM   Specimen: BLOOD   Result Value Ref Range Status   Specimen Description BLOOD RIGHT ASSIST CONTROL  Final   Special Requests   Final    BOTTLES DRAWN AEROBIC AND ANAEROBIC Blood Culture adequate volume   Culture  Setup Time   Final    GRAM NEGATIVE RODS IN BOTH AEROBIC AND ANAEROBIC BOTTLES CRITICAL VALUE NOTED.  VALUE IS CONSISTENT WITH PREVIOUSLY REPORTED  AND CALLED VALUE. Performed at [facility], [address] [place], [place] [address]    Culture GRAM NEGATIVE RODS  Final   Report Status PENDING  Incomplete  Blood Culture (routine x 2)     Status: Abnormal (Preliminary result)   Collection Time: 05/27/21 12:05 AM   Specimen: BLOOD  Result Value Ref Range Status   Specimen Description   Final    BLOOD LEFT FOREARM Performed at [facility], [address], [place], [place] [address]    Special Requests   Final    Blood Culture adequate volume Performed at [facility], [address] [place], [place] [address]    Culture  Setup Time   Final    GRAM NEGATIVE RODS IN BOTH AEROBIC AND ANAEROBIC BOTTLES Organism ID to follow CRITICAL RESULT CALLED TO, READ BACK BY AND VERIFIED WITH: MORGAN HICKS PHARMD 05/27/21 1311 SLM Performed at [facility], [address] [place], [place] [address]    Culture ESCHERICHIA COLI (A)  Final   Report Status PENDING  Incomplete  Urine Culture     Status: Abnormal   Collection Time: 05/27/21 12:05 AM   Specimen: Urine, Random  Result Value Ref Range Status   Specimen Description   Final    URINE, RANDOM Performed at [facility], [address], [place], [place] [address]    Special Requests   Final    NONE Performed at [facility], [address] [place], [place] [address]    Culture 70,000 COLONIES/mL ESCHERICHIA COLI (A)  Final   Report Status 05/28/2021 FINAL  Final   Organism ID, Bacteria ESCHERICHIA COLI (A)  Final      Susceptibility   Escherichia coli - MIC*    AMPICILLIN <=2  SENSITIVE Sensitive     CEFAZOLIN <=4 SENSITIVE Sensitive     CEFEPIME <=0.12 SENSITIVE Sensitive     CEFTRIAXONE <=0.25 SENSITIVE Sensitive     CIPROFLOXACIN <=0.25 SENSITIVE Sensitive     GENTAMICIN <=1 SENSITIVE Sensitive     IMIPENEM <=0.25 SENSITIVE Sensitive     NITROFURANTOIN <=16 SENSITIVE Sensitive     TRIMETH/SULFA <=20 SENSITIVE Sensitive     AMPICILLIN/SULBACTAM <=2 SENSITIVE Sensitive     PIP/TAZO <=4 SENSITIVE Sensitive     * 70,000 COLONIES/mL ESCHERICHIA COLI  Blood Culture ID Panel (Reflexed)     Status: Abnormal   Collection Time: 05/27/21 12:05 AM  Result Value Ref Range Status   Enterococcus faecalis NOT DETECTED NOT DETECTED Final   Enterococcus Faecium NOT DETECTED NOT DETECTED Final   Listeria monocytogenes NOT DETECTED NOT DETECTED Final   Staphylococcus species NOT DETECTED NOT DETECTED Final   Staphylococcus aureus (BCID) NOT DETECTED NOT DETECTED Final   Staphylococcus epidermidis NOT DETECTED NOT DETECTED Final   Staphylococcus lugdunensis NOT DETECTED NOT DETECTED Final   Streptococcus species NOT DETECTED NOT DETECTED Final   Streptococcus agalactiae NOT DETECTED NOT DETECTED Final   Streptococcus pneumoniae NOT DETECTED NOT DETECTED Final   Streptococcus pyogenes NOT DETECTED NOT DETECTED Final   A.calcoaceticus-baumannii NOT DETECTED NOT DETECTED Final   Bacteroides fragilis NOT DETECTED NOT DETECTED Final   Enterobacterales DETECTED (A) NOT DETECTED Final    Comment: Enterobacterales represent a large order of gram negative bacteria, not a single organism. CRITICAL RESULT CALLED TO, READ BACK BY AND VERIFIED WITH: MORGAN HICKS PHARMD 05/27/21 1311 SLM    Enterobacter cloacae complex NOT DETECTED NOT DETECTED Final   Escherichia coli DETECTED (A) NOT DETECTED Final  Comment: CRITICAL RESULT CALLED TO, READ BACK BY AND VERIFIED WITH: MORGAN HICKS PHARMD 05/27/21 1311 SLM    Klebsiella aerogenes NOT DETECTED NOT DETECTED Final   Klebsiella oxytoca  NOT DETECTED NOT DETECTED Final   Klebsiella pneumoniae NOT DETECTED NOT DETECTED Final   Proteus species NOT DETECTED NOT DETECTED Final   Salmonella species NOT DETECTED NOT DETECTED Final   Serratia marcescens NOT DETECTED NOT DETECTED Final   Haemophilus influenzae NOT DETECTED NOT DETECTED Final   Neisseria meningitidis NOT DETECTED NOT DETECTED Final   Pseudomonas aeruginosa NOT DETECTED NOT DETECTED Final   Stenotrophomonas maltophilia NOT DETECTED NOT DETECTED Final   Candida albicans NOT DETECTED NOT DETECTED Final   Candida auris NOT DETECTED NOT DETECTED Final   Candida glabrata NOT DETECTED NOT DETECTED Final   Candida krusei NOT DETECTED NOT DETECTED Final   Candida parapsilosis NOT DETECTED NOT DETECTED Final   Candida tropicalis NOT DETECTED NOT DETECTED Final   Cryptococcus neoformans/gattii NOT DETECTED NOT DETECTED Final   CTX-M ESBL NOT DETECTED NOT DETECTED Final   Carbapenem resistance IMP NOT DETECTED NOT DETECTED Final   Carbapenem resistance KPC NOT DETECTED NOT DETECTED Final   Carbapenem resistance NDM NOT DETECTED NOT DETECTED Final   Carbapenem resist OXA 48 LIKE NOT DETECTED NOT DETECTED Final   Carbapenem resistance VIM NOT DETECTED NOT DETECTED Final    Comment: Performed at [facility], [address] [place], [place] [address]    IMAGING RESULTS:  I have personally reviewed the films ? Impression/Recommendation ? ?Acute sepsis secondary to E.coli from a urosepsis Pan sensitive e.coli in UC- continue ceftriaxone Will need to get ultrasound renal to look for hydro, renal calculi- may do CT scan if needed  Mitral valve replacement -30 yrs ago- risk of infection/endocarditis very unlikely  H/o MI with AICD  H/o cervical cancer- treated ? Cathe Mons- much better  Discussed the management with patient ______________________________________________

## 2021-05-28 NOTE — Assessment & Plan Note (Signed)
Urine culture and blood culture growing E. coli.  Continue IV Rocephin.  Consult ID

## 2021-05-28 NOTE — Assessment & Plan Note (Signed)
Currently on heparin drip.  Will transition to warfarin.  INR therapeutic.  I have placed pharmacy consult to resume warfarin

## 2021-05-28 NOTE — Progress Notes (Signed)
  Progress Note    Vanessa Carlson   GZF:[id number]  DOB: [birth year]  DOA: 05/27/2021     1 Date of Service: 05/28/2021   60 year old female with a known history of ischemic cardiomyopathy status post AICD, right upper lobe lung cancer s/p radiation, essential hypertension, coronary artery disease is admitted for E. coli sepsis  Subjective:  No change in Symptoms.  Denies any fever  Hospital Problems * Sepsis due to Escherichia coli (E. coli) (HCC) Urine culture and blood culture growing E. coli.  Continue IV Rocephin.  Consult ID  Paroxysmal atrial fibrillation (HCC) Currently on heparin drip.  Will transition to warfarin.  INR therapeutic.  I have placed pharmacy consult to resume warfarin  HTN (hypertension) Continue close monitoring.  Currently not requiring any blood pressure medicine  Chronic HFrEF (heart failure with reduced ejection fraction) ([place]) Well compensated at this time.  Continue aspirin and statin.  Unable to give metoprolol due to soft blood pressure.  This can be resumed along with Lasix as blood pressure can tolerate     Objective Vital signs were reviewed and unremarkable except tachycardia.  Vitals:   05/28/21 0800 05/28/21 0830 05/28/21 1134 05/28/21 1519  BP: 118/68 109/66 107/77 126/71  Pulse: (!) 102 (!) 102 (!) 104 (!) 106  Resp: 20 20 17 18   Temp:  98.3 F (36.8 C) 98.5 F (36.9 C) 98.3 F (36.8 C)  TempSrc:  Oral Oral Oral  SpO2: 99% 99% 98% 100%  Weight:       102.1 kg  Exam Physical Exam  60 year old female lying in the bed comfortably without any acute distress Eyes pupil equal round reactive to light and accommodation, no scleral icterus Lungs clear to auscultation, no wheezing rales rhonchi crepitation Cardiovascular S1-S2 normal, no murmur rales or gallop Abdomen soft, benign Neuro alert and oriented, nonfocal Skin no rash or lesion  Labs / Other Information My review of labs, imaging, notes and other tests is significant for  Leukocytosis, elevated procalcitonin     Time spent: 35 minutes [facility] 05/28/2021, 5:09 PM

## 2021-05-28 NOTE — Assessment & Plan Note (Signed)
Continue close monitoring.  Currently not requiring any blood pressure medicine

## 2021-05-28 NOTE — ED Notes (Signed)
Caryl Pina RN aware of assigned bed

## 2021-05-28 NOTE — Progress Notes (Signed)
ANTICOAGULATION CONSULT NOTE - FOLLOW UP Pharmacy Consult for Heparin  Indication: chest pain/ACS  No Known Allergies  Patient Measurements: Weight: 102.1 kg (225 lb) Heparin Dosing Weight: 65.5 kg   Vital Signs: Temp: 98.3 F (36.8 C) (09/29 0830) Temp Source: Oral (09/29 0830) BP: 109/66 (09/29 0830) Pulse Rate: 102 (09/29 0830)  Labs: Recent Labs    05/27/21 0004 05/27/21 0225 05/27/21 0525 05/27/21 1131 05/27/21 1859 05/28/21 0317  HGB 12.6  --  10.9*  --   --  11.2*  HCT 38.3  --  33.1*  --   --  34.1*  PLT 131*  --  129*  --   --  118*  APTT 41*  --   --   --   --   --   LABPROT 26.0*  --  24.3*  --   --   --   INR 2.4*  --  2.2*  --   --   --   HEPARINUNFRC  --   --   --  0.43 0.30 0.28*  CREATININE 1.30*  --  1.39*  --   --  1.11*  TROPONINIHS 21* 127*  --   --   --   --     Estimated Creatinine Clearance: 59.6 mL/min (A) (by C-G formula based on SCr of 1.11 mg/dL (H)).   Medical History: Past Medical History:  Diagnosis Date   AICD (automatic cardioverter/defibrillator) present    Anterior myocardial infarction [facility])    a. 10/2014 - occluded LAD, complicated by cardiogenic shock-->Med Rx as interventional team was unable to open LAD.   Cancer of upper lobe of right lung ([place]) 09/2017   radiation therapy right side   Cervical cancer ([place])    in remission for ~20 yrs, s/p hysterectomy   Chronic combined systolic and diastolic CHF (congestive heart failure) ([place])    a. 10/2015 Echo: EF 20-25%, sev diast dysfxn, mildly reduced RV fxn. Nl MV prosthesis; b. 12/2017 Echo: EF 20-25%, diff HK. Sev ant/antsept HK, apical AK. Mild MS (mean graad 37mmHg). Mod TR. PASP 50mmHg.   Coronary artery disease    a. 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   Essential hypertension    Ischemic cardiomyopathy    a. 03/2015 s/p MDT single lead AICD;  b. 10/2015 Echo: EF 20-25%;  c. 12/2017 Echo:  EF 20-25%, diff HK.   Mass of upper lobe of right lung    a. noted on CXR & CT 08/2017 w/ abnl PET CT.   Mitral valve disease    a. 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 12/2017 Echo: Mild MS. Mean gradient 66mmHg.   Personal history of radiation therapy    f/u lung ca    Assessment: 60yo Female with h/o mixed dia/sysCHF, iCMP (s/p AICD), RUL lung CAN (s/p RAD Tx over 20 years ago), 1 HTN and CAD, presenting with new fever and chills since yesterday with associated urinary frequency and urgency as well as left back pain with flank pain. Troponins were elevated and pharmacy consulted to dose heparin in this during r/o for ACS/NSTEMI. Pt was on warfarin 7.5 mg PO daily at home for mechanical heart valve/A Fib.    Baseline Labs: aPTT - 41s INR - 2.4>2.2 Hgb - 12.6>10.9 Plts - 131>129  Date Time  HL  Rate/Comment 9/28 1151 0.43  Therapeutic;  (INR = 2.2)  9/28 1859 0.30  Therapeutic; 900 > 1000 un/hr 9/29     0317    0.28                Subtherapeutic  9/29     1112    0.31                Therapeutic  Goal of Therapy:  Heparin level 0.3-0.7 units/ml Monitor platelets by anticoagulation protocol: Yes   Plan:  Continue heparin gtt at 1100 units/hr. Will recheck HL 6 hrs to confirm therapeutic dose  Jaryan Chicoine Rodriguez-Guzman PharmD, BCPS 05/28/2021 10:36 AM

## 2021-05-28 NOTE — Progress Notes (Signed)
[place] for warfarin /heparin bridge  Indication: chest pain/ACS  No Known Allergies  Patient Measurements: Weight: 102.1 kg (225 lb) Heparin Dosing Weight: 65.5 kg   Vital Signs: Temp: 98.3 F (36.8 C) (09/29 1519) Temp Source: Oral (09/29 1519) BP: 126/71 (09/29 1519) Pulse Rate: 106 (09/29 1519)  Labs: Recent Labs    05/27/21 0004 05/27/21 0225 05/27/21 0525 05/27/21 1131 05/27/21 1859 05/28/21 0317 05/28/21 1044  HGB 12.6  --  10.9*  --   --  11.2*  --   HCT 38.3  --  33.1*  --   --  34.1*  --   PLT 131*  --  129*  --   --  118*  --   APTT 41*  --   --   --   --   --   --   LABPROT 26.0*  --  24.3*  --   --   --   --   INR 2.4*  --  2.2*  --   --   --   --   HEPARINUNFRC  --   --   --    < > 0.30 0.28* 0.31  CREATININE 1.30*  --  1.39*  --   --  1.11*  --   TROPONINIHS 21* 127*  --   --   --   --   --    < > = values in this interval not displayed.    Estimated Creatinine Clearance: 59.6 mL/min (A) (by C-G formula based on SCr of 1.11 mg/dL (H)).   Medical History: Past Medical History:  Diagnosis Date   AICD (automatic cardioverter/defibrillator) present    Anterior myocardial infarction [facility])    a. 10/2014 - occluded LAD, complicated by cardiogenic shock-->Med Rx as interventional team was unable to open LAD.   Cancer of upper lobe of right lung ([place]) 09/2017   radiation therapy right side   Cervical cancer ([place])    in remission for ~20 yrs, s/p hysterectomy   Chronic combined systolic and diastolic CHF (congestive heart failure) ([place])    a. 10/2015 Echo: EF 20-25%, sev diast dysfxn, mildly reduced RV fxn. Nl MV prosthesis; b. 12/2017 Echo: EF 20-25%, diff HK. Sev ant/antsept HK, apical AK. Mild MS (mean graad 65mmHg). Mod TR. PASP 2mmHg.   Coronary artery disease    a. 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   Essential  hypertension    Ischemic cardiomyopathy    a. 03/2015 s/p MDT single lead AICD;  b. 10/2015 Echo: EF 20-25%;  c. 12/2017 Echo: EF 20-25%, diff HK.   Mass of upper lobe of right lung    a. noted on CXR & CT 08/2017 w/ abnl PET CT.   Mitral valve disease    a. 2006 s/p MVR w/ 67mm SJM bileaflet mechanical valve-->chronic coumadin/ASA; b. 12/2017 Echo: Mild MS. Mean gradient 10mmHg.   Personal history of radiation therapy    f/u lung ca    Assessment: 60yo Female with h/o mixed dia/sysCHF, iCMP (s/p AICD), RUL lung CAN (s/p RAD Tx over 20 years ago), 1 HTN and CAD, presenting with new fever and chills since yesterday with associated urinary frequency and urgency as well as left back pain with flank pain. Troponins were elevated and pharmacy consulted to dose heparin in this during r/o for ACS/NSTEMI. She was on warfarin 7.5 mg PO daily except 5 mg on Saturdays  at home for mechanical heart valve/A Fib.  H&H, platelets stable.  DDIs: ceftriaxone  Goal of Therapy:  Heparin level 0.3-0.7 units/ml INR 2.5 - 3.5  Monitor platelets by anticoagulation protocol: Yes   Plan:   warfarin INR is subtherapeutic: warfarin 10 mg po tonight (this is 40% greater than average home dose INR daily to guide dose  heparin heparin 4000 units IV X 1 and increase drip rate to 1200 units/hr. recheck heparin level 6 hrs after rate change Daily CBC while on IV heparin  Dallie Piles Clinical pharmacist 05/28/2021 5:26 PM

## 2021-05-28 NOTE — Progress Notes (Signed)
NP Sharion Settler informed of patient's red mews score due to her hight heart rate (116) and her resp (24). Patient not having distress. Tolerated getting to bedside commode x 1 assist. No c/o pain.  Was given metoprolol 2.5 IV earlier due to heart rate. Lowest heart rate was 107. Made NP aware. No new orders, will continue to monitor.

## 2021-05-29 ENCOUNTER — Inpatient Hospital Stay: Payer: Medicare HMO

## 2021-05-29 DIAGNOSIS — R7881 Bacteremia: Secondary | ICD-10-CM | POA: Diagnosis not present

## 2021-05-29 DIAGNOSIS — A419 Sepsis, unspecified organism: Secondary | ICD-10-CM

## 2021-05-29 DIAGNOSIS — R6520 Severe sepsis without septic shock: Secondary | ICD-10-CM | POA: Diagnosis not present

## 2021-05-29 DIAGNOSIS — N10 Acute tubulo-interstitial nephritis: Secondary | ICD-10-CM

## 2021-05-29 DIAGNOSIS — A4151 Sepsis due to Escherichia coli [E. coli]: Secondary | ICD-10-CM

## 2021-05-29 LAB — CBC
HCT: 31 % — ABNORMAL LOW (ref 36.0–46.0)
Hemoglobin: 10.4 g/dL — ABNORMAL LOW (ref 12.0–15.0)
MCH: 29.9 pg (ref 26.0–34.0)
MCHC: 33.5 g/dL (ref 30.0–36.0)
MCV: 89.1 fL (ref 80.0–100.0)
Platelets: 92 10*3/uL — ABNORMAL LOW (ref 150–400)
RBC: 3.48 MIL/uL — ABNORMAL LOW (ref 3.87–5.11)
RDW: 15.9 % — ABNORMAL HIGH (ref 11.5–15.5)
WBC: 12.2 10*3/uL — ABNORMAL HIGH (ref 4.0–10.5)
nRBC: 0 % (ref 0.0–0.2)

## 2021-05-29 LAB — BASIC METABOLIC PANEL
Anion gap: 7 (ref 5–15)
BUN: 11 mg/dL (ref 8–23)
CO2: 23 mmol/L (ref 22–32)
Calcium: 8.5 mg/dL — ABNORMAL LOW (ref 8.9–10.3)
Chloride: 107 mmol/L (ref 98–111)
Creatinine, Ser: 0.98 mg/dL (ref 0.44–1.00)
GFR, Estimated: >60 mL/min (ref >60)
Glucose, Bld: 129 mg/dL — ABNORMAL HIGH (ref 70–99)
Potassium: 3.6 mmol/L (ref 3.5–5.1)
Sodium: 137 mmol/L (ref 135–145)

## 2021-05-29 LAB — CULTURE, BLOOD (ROUTINE X 2)
Special Requests: ADEQUATE
Special Requests: ADEQUATE

## 2021-05-29 LAB — PROTIME-INR
INR: 1.4 — ABNORMAL HIGH (ref 0.8–1.2)
Prothrombin Time: 17.3 seconds — ABNORMAL HIGH (ref 11.4–15.2)

## 2021-05-29 LAB — HEPARIN LEVEL (UNFRACTIONATED)
Heparin Unfractionated: 0.16 IU/mL — ABNORMAL LOW (ref 0.30–0.70)
Heparin Unfractionated: 0.53 IU/mL (ref 0.30–0.70)

## 2021-05-29 MED ORDER — WARFARIN SODIUM 10 MG PO TABS
10.0000 mg | ORAL_TABLET | Freq: Once | ORAL | Status: AC
  Administered 2021-05-29: 10 mg via ORAL
  Filled 2021-05-29: qty 1

## 2021-05-29 MED ORDER — SPIRONOLACTONE 25 MG PO TABS
25.0000 mg | ORAL_TABLET | Freq: Every day | ORAL | Status: DC
  Administered 2021-05-29 – 2021-05-30 (×2): 25 mg via ORAL
  Filled 2021-05-29 (×2): qty 1

## 2021-05-29 MED ORDER — FUROSEMIDE 20 MG PO TABS: 20.0000 mg | ORAL_TABLET | Freq: Every day | ORAL | Status: DC

## 2021-05-29 MED ORDER — POTASSIUM CHLORIDE 20 MEQ PO PACK
40.0000 meq | PACK | Freq: Once | ORAL | Status: AC
  Administered 2021-05-29: 40 meq via ORAL
  Filled 2021-05-29: qty 2

## 2021-05-29 MED ORDER — FUROSEMIDE 40 MG PO TABS
40.0000 mg | ORAL_TABLET | Freq: Two times a day (BID) | ORAL | Status: DC
  Administered 2021-05-29 – 2021-05-30 (×2): 40 mg via ORAL
  Filled 2021-05-29 (×2): qty 1

## 2021-05-29 MED ORDER — METOPROLOL TARTRATE 25 MG PO TABS
25.0000 mg | ORAL_TABLET | Freq: Two times a day (BID) | ORAL | Status: DC
  Administered 2021-05-29 – 2021-05-30 (×3): 25 mg via ORAL
  Filled 2021-05-29 (×3): qty 1

## 2021-05-29 MED ORDER — HEPARIN BOLUS VIA INFUSION
2200.0000 [IU] | Freq: Once | INTRAVENOUS | Status: AC
  Administered 2021-05-29: 2200 [IU] via INTRAVENOUS
  Filled 2021-05-29: qty 2200

## 2021-05-29 MED ORDER — SPIRONOLACTONE-HCTZ 25-25 MG PO TABS: 1.0000 | ORAL_TABLET | Freq: Every day | ORAL | Status: DC

## 2021-05-29 MED ORDER — HYDROCHLOROTHIAZIDE 25 MG PO TABS
25.0000 mg | ORAL_TABLET | Freq: Every day | ORAL | Status: DC
  Administered 2021-05-29 – 2021-05-30 (×2): 25 mg via ORAL
  Filled 2021-05-29 (×2): qty 1

## 2021-05-29 NOTE — Care Management Important Message (Signed)
Important Message  Patient Details  Name: Vanessa Carlson MRN: [id number] Date of Birth: 21-Mar-1961   Medicare Important Message Given:  Yes     Dannette Barbara 05/29/2021, 4:03 PM

## 2021-05-29 NOTE — Progress Notes (Signed)
Date of Admission:  05/27/2021     ID: Vanessa Carlson is a 60 y.o. female Principal Problem:   Sepsis due to Escherichia coli (E. coli) ([place]) Active Problems:   Chronic HFrEF (heart failure with reduced ejection fraction) (HCC)   HTN (hypertension)   Paroxysmal atrial fibrillation (HCC)   Severe sepsis (HCC)   Acute pyelonephritis   E. coli septicemia (HCC)    Subjective: Pt doing much better She has no fever Appetite good   Medications:   aspirin  81 mg Oral Daily   atorvastatin  80 mg Oral Daily   ezetimibe  10 mg Oral Daily   furosemide  40 mg Oral BID   gabapentin  300 mg Oral BID   spironolactone  25 mg Oral Daily   And   hydrochlorothiazide  25 mg Oral Daily   metoprolol tartrate  25 mg Oral BID   Warfarin - Pharmacist Dosing Inpatient   Does not apply q1600    Objective: Vital signs in last 24 hours: Temp:  [98 F (36.7 C)-98.2 F (36.8 C)] 98.2 F (36.8 C) (09/30 1601) Pulse Rate:  [84-106] 84 (09/30 1601) Resp:  [17] 17 (09/30 1601) BP: (110-133)/(69-79) 114/78 (09/30 1601) SpO2:  [98 %-100 %] 99 % (09/30 1601)  PHYSICAL EXAM:  General: Alert, cooperative, no distress, appears stated age.  Head: Normocephalic, without obvious abnormality, atraumatic. Eyes: Conjunctivae clear, anicteric sclerae. Pupils are equal ENT Nares normal. No drainage or sinus tenderness. Lips, mucosa, and tongue normal. No Thrush Neck: Supple, symmetrical, no adenopathy, thyroid: non tender no carotid bruit and no JVD. Back: No CVA tenderness. Lungs: Clear to auscultation bilaterally. No Wheezing or Rhonchi. No rales. Heart: Regular rate and rhythm, no murmur, rub or gallop. Abdomen: Soft, non-tender,not distended. Bowel sounds normal. No masses Extremities: atraumatic, no cyanosis. No edema. No clubbing Skin: No rashes or lesions. Or bruising Lymph: Cervical, supraclavicular normal. Neurologic: Grossly non-focal  Lab Results Recent Labs    05/28/21 0317  05/29/21 1000  WBC 17.8* 12.2*  HGB 11.2* 10.4*  HCT 34.1* 31.0*  NA 142 137  K 3.5 3.6  CL 108 107  CO2 25 23  BUN 15 11  CREATININE 1.11* 0.98   Liver Panel Recent Labs    05/27/21 0004  PROT 7.3  ALBUMIN 3.8  AST 33  ALT 20  ALKPHOS 93  BILITOT 1.8*   Sedimentation Rate No results for input(s): ESRSEDRATE in the last 72 hours. C-Reactive Protein No results for input(s): CRP in the last 72 hours.  Microbiology:  Studies/Results: US RENAL  Result Date: 05/29/2021 CLINICAL DATA:  Bacteremia EXAM: RENAL / URINARY TRACT ULTRASOUND COMPLETE COMPARISON:  None. FINDINGS: Right Kidney: Renal measurements: 10.9 x 3.8 x 5.2 cm = volume: 111.8 mL. Echogenicity within normal limits. No mass or hydronephrosis visualized. Left Kidney: Renal measurements: 11.1 x 5.3 x 5.4 cm = volume: 169.3 mL. Echogenicity within normal limits. No mass or hydronephrosis visualized. Bladder: Poorly distended and not well evaluated. Patient voided prior to exam. Other: None. IMPRESSION: No hydronephrosis. Electronically Signed   By: Macy Mis M.D.   On: 05/29/2021 11:35     Assessment/Plan:  Acute sepsis secondary to e.coli UTI and bacteremia Pan sensitive e.coli Currently on ceftriaxone and improving well On discharge can switch to levaquin for 5 more days  Mitral valve replacement- on coumadin- INR not therapeutic, hence it is being monitored  Past h/o MI with AICD  H/o cervical cancer - treated  Anemia  AKI  resolved   Discussed the management with the patient and hospitalist ID will sign off- call if needed

## 2021-05-29 NOTE — Progress Notes (Addendum)
[place] for warfarin /heparin bridge  Indication: chest pain/ACS  No Known Allergies  Patient Measurements: Height: 5\' 2"  (157.5 cm) Weight: 102 kg (224 lb 13.9 oz) IBW/kg (Calculated) : 50.1 Heparin Dosing Weight: 74.4 kg   Vital Signs: Temp: 98 F (36.7 C) (09/30 0400) Temp Source: Oral (09/30 0400) BP: 125/69 (09/30 0400) Pulse Rate: 101 (09/30 0400)  Labs: Recent Labs    05/27/21 0004 05/27/21 0225 05/27/21 0525 05/27/21 1131 05/28/21 0317 05/28/21 1044 05/28/21 1713 05/28/21 1730  HGB 12.6  --  10.9*  --  11.2*  --   --   --   HCT 38.3  --  33.1*  --  34.1*  --   --   --   PLT 131*  --  129*  --  118*  --   --   --   APTT 41*  --   --   --   --   --   --   --   LABPROT 26.0*  --  24.3*  --   --   --   --  18.1*  INR 2.4*  --  2.2*  --   --   --   --  1.5*  HEPARINUNFRC  --   --   --    < > 0.28* 0.31 0.27*  --   CREATININE 1.30*  --  1.39*  --  1.11*  --   --   --   TROPONINIHS 21* 127*  --   --   --   --   --   --    < > = values in this interval not displayed.    Estimated Creatinine Clearance: 59.6 mL/min (A) (by C-G formula based on SCr of 1.11 mg/dL (H)).   Medical History: Past Medical History:  Diagnosis Date   AICD (automatic cardioverter/defibrillator) present    Anterior myocardial infarction [facility])    a. 10/2014 - occluded LAD, complicated by cardiogenic shock-->Med Rx as interventional team was unable to open LAD.   Cancer of upper lobe of right lung ([place]) 09/2017   radiation therapy right side   Cervical cancer ([place])    in remission for ~20 yrs, s/p hysterectomy   Chronic combined systolic and diastolic CHF (congestive heart failure) ([place])    a. 10/2015 Echo: EF 20-25%, sev diast dysfxn, mildly reduced RV fxn. Nl MV prosthesis; b. 12/2017 Echo: EF 20-25%, diff HK. Sev ant/antsept HK, apical AK. Mild MS (mean graad 62mmHg). Mod TR. PASP 3mmHg.   Coronary artery disease    a. 10/2014 Ant STEMI/Cath: LAD 100p  ->Med managed as lesion could not be crossed-->complicated by CGS and post-MI pericarditis ([facility]).   Essential hypertension    Ischemic cardiomyopathy    a. 03/2015 s/p MDT single lead AICD;  b. 10/2015 Echo: EF 20-25%;  c. 12/2017 Echo: EF 20-25%, diff HK.   Mass of upper lobe of right lung    a. noted on CXR & CT 08/2017 w/ abnl PET CT.   Mitral valve disease    a. 2006 s/p MVR w/ 6mm SJM bileaflet mechanical valve-->chronic coumadin/ASA; b. 12/2017 Echo: Mild MS. Mean gradient 58mmHg.   Personal history of radiation therapy    f/u lung ca    Assessment: 60yo Female with h/o mixed dia/sysCHF, iCMP (s/p AICD), RUL lung CAN (s/p RAD Tx over 20 years ago), 1 HTN and CAD, presenting with new fever and chills since yesterday with associated urinary frequency and urgency as  well as left back pain with flank pain. Troponins were elevated and pharmacy consulted to dose heparin in this during r/o for ACS/NSTEMI. She was on warfarin 7.5 mg PO daily except 5 mg on Saturdays at home for mechanical heart valve/A Fib.  H&H, platelets stable.  DDIs: ceftriaxone (increases INR)  Date/Time HL Interpretation  09/28 1131 0.43 Therapeutic x1 09/28 1859 0.30 Therapeutic x2 09/29 0317 0.28 Subthera 09/29 1044 0.31 Thera x1 09/29 1713 0.27 Subthera 09/30 1000 0.16 Subthera, lab was collected late. Confirmed with nurse no issues with line/infusion. Bolus 2200 units and increase rate 1200>1450 units/hr   Date/Time INR Dose 9/28 0525 2.2  9/29 1730 1.5 10mg  9/30 1000 1.4 10mg   Goal of Therapy:  Heparin level 0.3-0.7 units/ml INR 2.5 - 3.5  Monitor platelets by anticoagulation protocol: Yes   Plan:   warfarin INR is subtherapeutic, INR will likely increase based on dose given 9/29 w/ AM labs but still expect INR to be subtherapeutic  Will give 10mg  warfarin tonight (40% increase in usual home dose) INR daily to guide dose  heparin Heparin subtherapeutic, will bolus with 2200 units and increase rate to  1450units/hr Recheck heparin level 6 hrs after rate change Daily CBC while on IV heparin  Narda Rutherford, PharmD Pharmacy Resident  05/29/2021 12:25 PM

## 2021-05-29 NOTE — Progress Notes (Signed)
[place] for warfarin /heparin bridge  Indication: chest pain/ACS  No Known Allergies  Patient Measurements: Height: 5\' 2"  (157.5 cm) Weight: 102 kg (224 lb 13.9 oz) IBW/kg (Calculated) : 50.1 Heparin Dosing Weight: 74.4 kg   Vital Signs: Temp: 98.2 F (36.8 C) (09/30 1601) Temp Source: Oral (09/30 1601) BP: 114/78 (09/30 1601) Pulse Rate: 84 (09/30 1601)  Labs: Recent Labs    05/27/21 0004 05/27/21 0225 05/27/21 0525 05/27/21 1131 05/28/21 0317 05/28/21 1044 05/28/21 1713 05/28/21 1730 05/29/21 1000 05/29/21 1843  HGB 12.6  --  10.9*  --  11.2*  --   --   --  10.4*  --   HCT 38.3  --  33.1*  --  34.1*  --   --   --  31.0*  --   PLT 131*  --  129*  --  118*  --   --   --  92*  --   APTT 41*  --   --   --   --   --   --   --   --   --   LABPROT 26.0*  --  24.3*  --   --   --   --  18.1* 17.3*  --   INR 2.4*  --  2.2*  --   --   --   --  1.5* 1.4*  --   HEPARINUNFRC  --   --   --    < > 0.28*   < > 0.27*  --  0.16* 0.53  CREATININE 1.30*  --  1.39*  --  1.11*  --   --   --  0.98  --   TROPONINIHS 21* 127*  --   --   --   --   --   --   --   --    < > = values in this interval not displayed.    Estimated Creatinine Clearance: 67.5 mL/min (by C-G formula based on SCr of 0.98 mg/dL).   Medical History: Past Medical History:  Diagnosis Date   AICD (automatic cardioverter/defibrillator) present    Anterior myocardial infarction [facility])    a. 10/2014 - occluded LAD, complicated by cardiogenic shock-->Med Rx as interventional team was unable to open LAD.   Cancer of upper lobe of right lung ([place]) 09/2017   radiation therapy right side   Cervical cancer ([place])    in remission for ~20 yrs, s/p hysterectomy   Chronic combined systolic and diastolic CHF (congestive heart failure) ([place])    a. 10/2015 Echo: EF 20-25%, sev diast dysfxn, mildly reduced RV fxn. Nl MV prosthesis; b. 12/2017 Echo: EF 20-25%, diff HK. Sev ant/antsept HK, apical AK.  Mild MS (mean graad 33mmHg). Mod TR. PASP 85mmHg.   Coronary artery disease    a. 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   Essential hypertension    Ischemic cardiomyopathy    a. 03/2015 s/p MDT single lead AICD;  b. 10/2015 Echo: EF 20-25%;  c. 12/2017 Echo: EF 20-25%, diff HK.   Mass of upper lobe of right lung    a. noted on CXR & CT 08/2017 w/ abnl PET CT.   Mitral valve disease    a. 2006 s/p MVR w/ 72mm SJM bileaflet mechanical valve-->chronic coumadin/ASA; b. 12/2017 Echo: Mild MS. Mean gradient 85mmHg.   Personal history of radiation therapy    f/u lung  ca    Assessment: 60yo Female with h/o mixed dia/sysCHF, iCMP (s/p AICD), RUL lung CAN (s/p RAD Tx over 20 years ago), 1 HTN and CAD, presenting with new fever and chills since yesterday with associated urinary frequency and urgency as well as left back pain with flank pain. Troponins were elevated and pharmacy consulted to dose heparin in this during r/o for ACS/NSTEMI. She was on warfarin 7.5 mg PO daily except 5 mg on Saturdays at home for mechanical heart valve/A Fib.  H&H, platelets stable.  DDIs: ceftriaxone (increases INR)  Date/Time HL Interpretation  09/28 1131 0.43 Therapeutic x1 09/28 1859 0.30 Therapeutic x2 09/29 0317 0.28 Subthera 09/29 1044 0.31 Thera x1 09/29 1713 0.27 Subthera 09/30 1000 0.16 Subthera, lab was collected late. Confirmed with nurse no issues with line/infusion. Bolus 2200 units and increase rate 1200>1450 units/hr 09/30 1843 0.53 Therapeutic   Date/Time INR Dose 9/28 0525 2.2  9/29 1730 1.5 10mg  9/30 1000 1.4 10mg   Goal of Therapy:  Heparin level 0.3-0.7 units/ml INR 2.5 - 3.5  Monitor platelets by anticoagulation protocol: Yes   Plan:   warfarin INR is subtherapeutic, INR will likely increase based on dose given 9/29 w/ AM labs but still expect INR to be subtherapeutic  Will give 10mg  warfarin tonight  (40% increase in usual home dose) INR daily to guide dose  heparin Heparin therapeutic, continue heparin infusion at 1450 units/hr Check confirmatory heparin level in 6 hrs Daily CBC while on IV heparin  Darnelle Bos, PharmD 05/29/2021 7:28 PM

## 2021-05-29 NOTE — Progress Notes (Addendum)
PROGRESS NOTE    Vanessa Carlson  QBH:[id number] DOB: August 14, 1961 DOA: 05/27/2021 PCP: Virginia Crews, MD   Chief complaint.  Weakness. Brief Narrative:  60 year old female with a known history of ischemic cardiomyopathy status post AICD, right upper lobe lung cancer s/p radiation, essential hypertension, coronary artery disease is admitted for E. coli sepsis.  Patient urine culture and blood culture both grew E. coli.  Has been seen by ID.    Assessment & Plan:   Principal Problem:   Sepsis due to Escherichia coli (E. coli) (HCC) Active Problems:   Chronic HFrEF (heart failure with reduced ejection fraction) (HCC)   HTN (hypertension)   Paroxysmal atrial fibrillation (HCC)  Severe sepsis secondary to E. coli septicemia. E. coli septicemia secondary to E. coli pyelonephritis E. coli pyelonephritis. Hypotension secondary to sepsis. Kidney injury secondary to sepsis Acute metabolic encephalopathy due to sepsis. Improved Patient met severe sepsis criteria at time of admission with temperature 103.2, heart rate 120, respirate 32, leukopenia of 3.5, subsequently leukocytosis.  Patient also had a significant hypotension at the time of admission, lactic acidosis of 3.6, elevated procalcitonin 83.7. Patient has been seen by ID, based on blood culture results, patient currently on ceftriaxone. Patient is also pending renal ultrasound to rule out hydronephrosis or renal abscess. Planning to discharge her tomorrow with oral antibiotics per recommendation from ID. Patient renal function is better. Discussed with ID, patient will need 6 days to Levaquin if started tomorrow.  Chronic systolic congestive heart failure. Paroxysmal atrial fibrillation. Essential hypertension. Mechanical mitral valve. Patient INR is subtherapeutic, currently on heparin, continue warfarin per pharmacy dosing. Patient has some baseline short of breath, no overall volume overload, restart home dose diuretics  with Lasix and Aldactone. Restart metoprolol as patient has some tachycardia.   Hypokalemia. Potassium 3.5 today, will give additional 40 mEq of oral potassium.  Recheck level tomorrow.    DVT prophylaxis: Heparin drip Code Status: full Family Communication:  Disposition Plan:    Status is: Inpatient  Remains inpatient appropriate because:IV treatments appropriate due to intensity of illness or inability to take PO and Inpatient level of care appropriate due to severity of illness  Dispo: The patient is from: Home              Anticipated d/c is to: Home              Patient currently is not medically stable to d/c.   Difficult to place patient No        I/O last 3 completed shifts: In: 1144.2 [I.V.:955.5; IV Piggyback:188.6] Out: 550 [Urine:550] Total I/O In: 360 [P.O.:360] Out: -      Consultants:  ID  Procedures: None  Antimicrobials: Ceftriaxone. Subjective: Patient feels well today, he has some short of breath with exertion, which is baseline.  No cough. No abdominal pain nausea vomiting. No dysuria hematuria  No fever or chills.  Objective: Vitals:   05/28/21 2016 05/29/21 0000 05/29/21 0400 05/29/21 0751  BP: 122/76 133/72 125/69 130/79  Pulse: (!) 103 (!) 106 (!) 101 99  Resp: 17     Temp: 99.2 F (37.3 C) 98 F (36.7 C) 98 F (36.7 C) 98.1 F (36.7 C)  TempSrc: Oral Oral Oral Oral  SpO2: 100% 98% 98% 99%  Weight: 102 kg     Height: _0  (1.575 m)       Intake/Output Summary (Last 24 hours) at 05/29/2021 1006 Last data filed at 05/29/2021 0900 Gross per 24 hour  Intake 1283.13 ml  Output --  Net 1283.13 ml   Filed Weights   05/26/21 2347 05/27/21 0359 05/28/21 2016  Weight: 59 kg 102.1 kg 102 kg    Examination:  General exam: Appears calm and comfortable  Respiratory system: Clear to auscultation. Respiratory effort normal. Cardiovascular system: Irregular.  No JVD, murmurs, rubs, gallops or clicks. No pedal  edema. Gastrointestinal system: Abdomen is nondistended, soft and nontender. No organomegaly or masses felt. Normal bowel sounds heard. Central nervous system: Alert and oriented. No focal neurological deficits. Extremities: Symmetric 5 x 5 power. Skin: No rashes, lesions or ulcers Psychiatry: Judgement and insight appear normal. Mood & affect appropriate.     Data Reviewed: I have personally reviewed following labs and imaging studies  CBC: Recent Labs  Lab 05/27/21 0004 05/27/21 0525 05/28/21 0317  WBC 3.5* 12.5* 17.8*  NEUTROABS 2.6  --   --   HGB 12.6 10.9* 11.2*  HCT 38.3 33.1* 34.1*  MCV 92.3 88.3 91.9  PLT 131* 129* 390*   Basic Metabolic Panel: Recent Labs  Lab 05/27/21 0004 05/27/21 0525 05/28/21 0317  NA 138 138 142  K 3.6 3.0* 3.5  CL 102 102 108  CO2 _0 GLUCOSE 146* 152* 119*  BUN _1 CREATININE 1.30* 1.39* 1.11*  CALCIUM 8.7* 8.4* 8.5*   GFR: Estimated Creatinine Clearance: 59.6 mL/min (A) (by C-G formula based on SCr of 1.11 mg/dL (H)). Liver Function Tests: Recent Labs  Lab 05/27/21 0004  AST 33  ALT 20  ALKPHOS 93  BILITOT 1.8*  PROT 7.3  ALBUMIN 3.8   No results for input(s): LIPASE, AMYLASE in the last 168 hours. No results for input(s): AMMONIA in the last 168 hours. Coagulation Profile: Recent Labs  Lab 05/27/21 0004 05/27/21 0525 05/28/21 1730  INR 2.4* 2.2* 1.5*   Cardiac Enzymes: No results for input(s): CKTOTAL, CKMB, CKMBINDEX, TROPONINI in the last 168 hours. BNP (last 3 results) No results for input(s): PROBNP in the last 8760 hours. HbA1C: No results for input(s): HGBA1C in the last 72 hours. CBG: No results for input(s): GLUCAP in the last 168 hours. Lipid Profile: No results for input(s): CHOL, HDL, LDLCALC, TRIG, CHOLHDL, LDLDIRECT in the last 72 hours. Thyroid Function Tests: No results for input(s): TSH, T4TOTAL, FREET4, T3FREE, THYROIDAB in the last 72 hours. Anemia Panel: No results for  input(s): VITAMINB12, FOLATE, FERRITIN, TIBC, IRON, RETICCTPCT in the last 72 hours. Sepsis Labs: Recent Labs  Lab 05/27/21 0004 05/27/21 0225 05/27/21 0525 05/27/21 1131 05/28/21 0317 05/28/21 1044  PROCALCITON 11.49  --  82.17 83.72 38.80  --   LATICACIDVEN 3.6* 2.9*  --   --   --  1.6    Recent Results (from the past 240 hour(s))  Resp Panel by RT-PCR (Flu A&B, Covid) Nasopharyngeal Swab     Status: None   Collection Time: 05/27/21 12:04 AM   Specimen: Nasopharyngeal Swab; Nasopharyngeal(NP) swabs in vial transport medium  Result Value Ref Range Status   SARS Coronavirus 2 by RT PCR NEGATIVE NEGATIVE Final    Comment: (NOTE) SARS-CoV-2 target nucleic acids are NOT DETECTED.  The SARS-CoV-2 RNA is generally detectable in upper respiratory specimens during the acute phase of infection. The lowest concentration of SARS-CoV-2 viral copies this assay can detect is 138 copies/mL. A negative result does not preclude SARS-Cov-2 infection and should not be used as the sole basis for treatment or other patient management decisions. A negative result may occur with  improper specimen collection/handling, submission of specimen other than nasopharyngeal swab, presence of viral mutation(s) within the areas targeted by this assay, and inadequate number of viral copies(<138 copies/mL). A negative result must be combined with clinical observations, patient history, and epidemiological information. The expected result is Negative.  Fact Sheet for Patients:  EntrepreneurPulse.com.au  Fact Sheet for Healthcare Providers:  IncredibleEmployment.be  This test is no t yet approved or cleared by the [place] FDA and  has been authorized for detection and/or diagnosis of SARS-CoV-2 by FDA under an Emergency Use Authorization (EUA). This EUA will remain  in effect (meaning this test can be used) for the duration of the COVID-19 declaration under Section  564(b)(1) of the Act, 21 U.S.C.section 360bbb-3(b)(1), unless the authorization is terminated  or revoked sooner.       Influenza A by PCR NEGATIVE NEGATIVE Final   Influenza B by PCR NEGATIVE NEGATIVE Final    Comment: (NOTE) The Xpert Xpress SARS-CoV-2/FLU/RSV plus assay is intended as an aid in the diagnosis of influenza from Nasopharyngeal swab specimens and should not be used as a sole basis for treatment. Nasal washings and aspirates are unacceptable for Xpert Xpress SARS-CoV-2/FLU/RSV testing.  Fact Sheet for Patients: EntrepreneurPulse.com.au  Fact Sheet for Healthcare Providers: IncredibleEmployment.be  This test is not yet approved or cleared by the [place] FDA and has been authorized for detection and/or diagnosis of SARS-CoV-2 by FDA under an Emergency Use Authorization (EUA). This EUA will remain in effect (meaning this test can be used) for the duration of the COVID-19 declaration under Section 564(b)(1) of the Act, 21 U.S.C. section 360bbb-3(b)(1), unless the authorization is terminated or revoked.  Performed at [facility], [address] [place], [place] [address]   Blood Culture (routine x 2)     Status: Abnormal   Collection Time: 05/27/21 12:05 AM   Specimen: BLOOD  Result Value Ref Range Status   Specimen Description   Final    BLOOD RIGHT ASSIST CONTROL Performed at [facility], [address] [place], [place] [address]    Special Requests   Final    BOTTLES DRAWN AEROBIC AND ANAEROBIC Blood Culture adequate volume Performed at [facility], [address], [place], [place] [address]    Culture  Setup Time   Final    GRAM NEGATIVE RODS IN BOTH AEROBIC AND ANAEROBIC BOTTLES CRITICAL VALUE NOTED.  VALUE IS CONSISTENT WITH PREVIOUSLY REPORTED AND CALLED VALUE. Performed at [facility], [address] [place], [place] [address]    Culture (A)  Final     ESCHERICHIA COLI SUSCEPTIBILITIES PERFORMED ON PREVIOUS CULTURE WITHIN THE LAST 5 DAYS. Performed at [facility], [place] [address] [place], [place] [address]    Report Status 05/29/2021 FINAL  Final  Blood Culture (routine x 2)     Status: Abnormal   Collection Time: 05/27/21 12:05 AM   Specimen: BLOOD  Result Value Ref Range Status   Specimen Description   Final    BLOOD LEFT FOREARM Performed at [facility], [address] [place], [place] [address]    Special Requests   Final    Blood Culture adequate volume Performed at [facility], [address] [place], [place] [address]    Culture  Setup Time   Final    GRAM NEGATIVE RODS IN BOTH AEROBIC AND ANAEROBIC BOTTLES Organism ID to follow CRITICAL RESULT CALLED TO, READ BACK BY AND VERIFIED WITH: MORGAN HICKS PHARMD 05/27/21 1311  SLM Performed at [facility], [address] [place], [place] [address]    Culture ESCHERICHIA COLI (A)  Final   Report Status 05/29/2021 FINAL  Final   Organism ID, Bacteria ESCHERICHIA COLI  Final      Susceptibility   Escherichia coli - MIC*    AMPICILLIN <=2 SENSITIVE Sensitive     CEFAZOLIN <=4 SENSITIVE Sensitive     CEFEPIME <=0.12 SENSITIVE Sensitive     CEFTAZIDIME <=1 SENSITIVE Sensitive     CEFTRIAXONE <=0.25 SENSITIVE Sensitive     CIPROFLOXACIN <=0.25 SENSITIVE Sensitive     GENTAMICIN <=1 SENSITIVE Sensitive     IMIPENEM <=0.25 SENSITIVE Sensitive     TRIMETH/SULFA <=20 SENSITIVE Sensitive     AMPICILLIN/SULBACTAM <=2 SENSITIVE Sensitive     PIP/TAZO <=4 SENSITIVE Sensitive     * ESCHERICHIA COLI  Urine Culture     Status: Abnormal   Collection Time: 05/27/21 12:05 AM   Specimen: Urine, Random  Result Value Ref Range Status   Specimen Description   Final    URINE, RANDOM Performed at [facility], [address], [place], [place] [address]    Special Requests   Final    NONE Performed at [facility], [address] [place], [place] [address]    Culture 70,000 COLONIES/mL ESCHERICHIA COLI (A)  Final   Report Status 05/28/2021 FINAL  Final   Organism ID, Bacteria ESCHERICHIA COLI (A)  Final      Susceptibility   Escherichia coli - MIC*    AMPICILLIN <=2 SENSITIVE Sensitive     CEFAZOLIN <=4 SENSITIVE Sensitive     CEFEPIME <=0.12 SENSITIVE Sensitive     CEFTRIAXONE <=0.25 SENSITIVE Sensitive     CIPROFLOXACIN <=0.25 SENSITIVE Sensitive     GENTAMICIN <=1 SENSITIVE Sensitive     IMIPENEM <=0.25 SENSITIVE Sensitive     NITROFURANTOIN <=16 SENSITIVE Sensitive     TRIMETH/SULFA <=20 SENSITIVE Sensitive     AMPICILLIN/SULBACTAM <=2 SENSITIVE Sensitive     PIP/TAZO <=4 SENSITIVE Sensitive     * 70,000 COLONIES/mL ESCHERICHIA COLI  Blood Culture ID Panel (Reflexed)     Status: Abnormal   Collection Time: 05/27/21 12:05 AM  Result Value Ref Range Status   Enterococcus faecalis NOT DETECTED NOT DETECTED Final   Enterococcus Faecium NOT DETECTED NOT DETECTED Final   Listeria monocytogenes NOT DETECTED NOT DETECTED Final   Staphylococcus species NOT DETECTED NOT DETECTED Final   Staphylococcus aureus (BCID) NOT DETECTED NOT DETECTED Final   Staphylococcus epidermidis NOT DETECTED NOT DETECTED Final   Staphylococcus lugdunensis NOT DETECTED NOT DETECTED Final   Streptococcus species NOT DETECTED NOT DETECTED Final   Streptococcus agalactiae NOT DETECTED NOT DETECTED Final   Streptococcus pneumoniae NOT DETECTED NOT DETECTED Final   Streptococcus pyogenes NOT DETECTED NOT DETECTED Final   A.calcoaceticus-baumannii NOT DETECTED NOT DETECTED Final   Bacteroides fragilis NOT DETECTED NOT DETECTED Final   Enterobacterales DETECTED (A) NOT DETECTED Final    Comment: Enterobacterales represent a large order of gram negative bacteria, not a single organism. CRITICAL RESULT CALLED TO, READ BACK BY AND VERIFIED WITH: MORGAN HICKS PHARMD 05/27/21 1311 SLM    Enterobacter cloacae complex NOT DETECTED NOT  DETECTED Final   Escherichia coli DETECTED (A) NOT DETECTED Final    Comment: CRITICAL RESULT CALLED TO, READ BACK BY AND VERIFIED WITH: MORGAN HICKS PHARMD 05/27/21 1311 SLM    Klebsiella aerogenes NOT DETECTED NOT DETECTED Final   Klebsiella oxytoca NOT DETECTED NOT DETECTED  Final   Klebsiella pneumoniae NOT DETECTED NOT DETECTED Final   Proteus species NOT DETECTED NOT DETECTED Final   Salmonella species NOT DETECTED NOT DETECTED Final   Serratia marcescens NOT DETECTED NOT DETECTED Final   Haemophilus influenzae NOT DETECTED NOT DETECTED Final   Neisseria meningitidis NOT DETECTED NOT DETECTED Final   Pseudomonas aeruginosa NOT DETECTED NOT DETECTED Final   Stenotrophomonas maltophilia NOT DETECTED NOT DETECTED Final   Candida albicans NOT DETECTED NOT DETECTED Final   Candida auris NOT DETECTED NOT DETECTED Final   Candida glabrata NOT DETECTED NOT DETECTED Final   Candida krusei NOT DETECTED NOT DETECTED Final   Candida parapsilosis NOT DETECTED NOT DETECTED Final   Candida tropicalis NOT DETECTED NOT DETECTED Final   Cryptococcus neoformans/gattii NOT DETECTED NOT DETECTED Final   CTX-M ESBL NOT DETECTED NOT DETECTED Final   Carbapenem resistance IMP NOT DETECTED NOT DETECTED Final   Carbapenem resistance KPC NOT DETECTED NOT DETECTED Final   Carbapenem resistance NDM NOT DETECTED NOT DETECTED Final   Carbapenem resist OXA 48 LIKE NOT DETECTED NOT DETECTED Final   Carbapenem resistance VIM NOT DETECTED NOT DETECTED Final    Comment: Performed at [facility], [address], [place], [place] [address]         Radiology Studies: No results found.      Scheduled Meds:  aspirin  81 mg Oral Daily   atorvastatin  80 mg Oral Daily   ezetimibe  10 mg Oral Daily   furosemide  40 mg Oral BID   gabapentin  300 mg Oral BID   influenza vac split quadrivalent PF  0.5 mL Intramuscular Tomorrow-1000   potassium chloride  40 mEq Oral Once    spironolactone-hydrochlorothiazide  1 tablet Oral Daily   Warfarin - Pharmacist Dosing Inpatient   Does not apply q1600   Continuous Infusions:  cefTRIAXone (ROCEPHIN)  IV Stopped (05/28/21 2155)   heparin 1,200 Units/hr (05/29/21 0512)     LOS: 2 days    Time spent: 32 minutes    Sharen Hones, MD [facility]   To contact the attending provider between 7A-7P or the covering provider during after hours 7P-7A, please log into the web site [email or web address] and access using universal [place] password for that web site. If you do not have the password, please call the hospital operator.  05/29/2021, 10:06 AM

## 2021-05-30 DIAGNOSIS — N10 Acute tubulo-interstitial nephritis: Secondary | ICD-10-CM

## 2021-05-30 LAB — CBC
HCT: 33 % — ABNORMAL LOW (ref 36.0–46.0)
Hemoglobin: 10.8 g/dL — ABNORMAL LOW (ref 12.0–15.0)
MCH: 29 pg (ref 26.0–34.0)
MCHC: 32.7 g/dL (ref 30.0–36.0)
MCV: 88.7 fL (ref 80.0–100.0)
Platelets: 110 10*3/uL — ABNORMAL LOW (ref 150–400)
RBC: 3.72 MIL/uL — ABNORMAL LOW (ref 3.87–5.11)
RDW: 15.5 % (ref 11.5–15.5)
WBC: 7.7 10*3/uL (ref 4.0–10.5)
nRBC: 0.3 % — ABNORMAL HIGH (ref 0.0–0.2)

## 2021-05-30 LAB — PROTIME-INR
INR: 1.9 — ABNORMAL HIGH (ref 0.8–1.2)
Prothrombin Time: 21.6 seconds — ABNORMAL HIGH (ref 11.4–15.2)

## 2021-05-30 LAB — HEPARIN LEVEL (UNFRACTIONATED): Heparin Unfractionated: 0.49 IU/mL (ref 0.30–0.70)

## 2021-05-30 MED ORDER — WARFARIN SODIUM 7.5 MG PO TABS
7.5000 mg | ORAL_TABLET | Freq: Once | ORAL | Status: DC
  Filled 2021-05-30: qty 1

## 2021-05-30 MED ORDER — WARFARIN SODIUM 7.5 MG PO TABS
7.5000 mg | ORAL_TABLET | Freq: Once | ORAL | Status: AC
  Administered 2021-05-30: 7.5 mg via ORAL
  Filled 2021-05-30: qty 1

## 2021-05-30 MED ORDER — ENOXAPARIN SODIUM 100 MG/ML IJ SOSY
1.0000 mg/kg | PREFILLED_SYRINGE | Freq: Once | INTRAMUSCULAR | Status: AC
  Administered 2021-05-30: 102.5 mg via SUBCUTANEOUS
  Filled 2021-05-30: qty 1.02

## 2021-05-30 MED ORDER — LEVOFLOXACIN 500 MG PO TABS: 500.0000 mg | ORAL_TABLET | Freq: Every day | ORAL | 0 refills | Status: DC

## 2021-05-30 NOTE — Progress Notes (Signed)
Order to discharge pt home.  Discharge instructions/AVS given to patient and reviewed - education provided as needed.  Medication prescriptions given to pt. Pt advised to call PCP and/or come back to the hospital if there are any problems. Pt verbalized understanding.

## 2021-05-30 NOTE — Discharge Summary (Signed)
Physician Discharge Summary  Patient ID: MIGUEL MEDAL MRN: [id number] DOB/AGE: 61-Nov-1962 61 y.o.  Admit date: 05/27/2021 Discharge date: 05/30/2021  Admission Diagnoses:  Discharge Diagnoses:  Principal Problem:   Sepsis due to Escherichia coli (E. coli) ([place]) Active Problems:   Chronic HFrEF (heart failure with reduced ejection fraction) (HCC)   HTN (hypertension)   Paroxysmal atrial fibrillation (HCC)   Severe sepsis (HCC)   Acute pyelonephritis   E. coli septicemia ([place])   Discharged Condition: good  Hospital Course:   60 year old female with a known history of ischemic cardiomyopathy status post AICD, right upper lobe lung cancer s/p radiation, essential hypertension, coronary artery disease is admitted for E. coli sepsis.  Patient urine culture and blood culture both grew E. coli.  Has been seen by ID.   Severe sepsis secondary to E. coli septicemia. E. coli septicemia secondary to E. coli pyelonephritis E. coli pyelonephritis. Hypotension secondary to sepsis. Kidney injury secondary to sepsis Acute metabolic encephalopathy due to sepsis. Improved Patient met severe sepsis criteria at time of admission with temperature 103.2, heart rate 120, respirate 32, leukopenia of 3.5, subsequently leukocytosis.  Patient also had a significant hypotension at the time of admission, lactic acidosis of 3.6, elevated procalcitonin 83.7. Patient has been seen by ID, based on blood culture results, patient is treated ceftriaxone. Renal ultrasound ruled out hydronephrosis or renal abscess. Discussed with ID, patient will need 5 days to Levaquin. Discussed with the pharmacy, patient will definitely receive our dose of traction before discharge.  Levaquin will be started tomorrow.    Chronic systolic congestive heart failure. Paroxysmal atrial fibrillation. Essential hypertension. Mechanical mitral valve. Resume all treatment for systolic congestive heart failure. INR today is 1.9.   Discussed with pharmacy, will give 1 dose of Lovenox today, anticipating therapeutic INR tomorrow.  Resume warfarin 7.5 mg tomorrow, patient is advised to check INR with her primary care physician on Monday to decide next this dose. Due to interaction of Levaquin and warfarin, the dose might need to be monitored closely.     Hypokalemia. Potassium has normalized.  Thrombocytopenia. Stable/improving.  Consults: cardiology and ID  Significant Diagnostic Studies:   Treatments: Rocephin, heparin drip  Discharge Exam: Blood pressure 134/84, pulse 92, temperature 98.6 F (37 C), temperature source Oral, resp. rate 19, height '5\' 2"'  (1.575 m), weight 102 kg, SpO2 97 %. General appearance: alert and cooperative Resp: clear to auscultation bilaterally Cardio: regular rate and rhythm, S1, S2 normal, no murmur, click, rub or gallop GI: soft, non-tender; bowel sounds normal; no masses,  no organomegaly Extremities: extremities normal, atraumatic, no cyanosis or edema  Disposition: Discharge disposition: 01-Home or Self Care       Discharge Instructions     Diet - low sodium heart healthy   Complete by: As directed    Increase activity slowly   Complete by: As directed       Allergies as of 05/30/2021   No Known Allergies      Medication List     TAKE these medications    acetaminophen 325 MG tablet Commonly known as: TYLENOL Take 650 mg by mouth every 6 (six) hours as needed.   aspirin 81 MG chewable tablet Chew 81 mg by mouth daily.   atorvastatin 80 MG tablet Commonly known as: LIPITOR TAKE 1 TABLET BY MOUTH ONCE DAILY. OFFICE VISIT AND LABS NEEDED FOR FURTHER REFILLS.   cyclobenzaprine 5 MG tablet Commonly known as: FLEXERIL Take 1 tablet (5 mg total) by mouth 3 (  three) times daily as needed for muscle spasms.   Entresto 24-26 MG Generic drug: sacubitril-valsartan Take 1 tablet by mouth twice daily   ezetimibe 10 MG tablet Commonly known as: ZETIA Take 1  tablet (10 mg total) by mouth daily.   fluticasone 50 MCG/ACT nasal spray Commonly known as: FLONASE Place 2 sprays into both nostrils as needed for allergies or rhinitis.   furosemide 40 MG tablet Commonly known as: LASIX Take 2 tablets by mouth twice daily   gabapentin 300 MG capsule Commonly known as: NEURONTIN Take 1 capsule (300 mg total) by mouth 2 (two) times daily.   levofloxacin 500 MG tablet Commonly known as: Levaquin Take 1 tablet (500 mg total) by mouth daily for 5 days. Start taking on: May 31, 2021   metoprolol succinate 25 MG 24 hr tablet Commonly known as: TOPROL-XL Take 1 tablet (25 mg total) by mouth in the morning and at bedtime. Take with or immediately following a meal.   nitroGLYCERIN 0.3 MG SL tablet Commonly known as: NITROSTAT Place 1 tablet (0.3 mg total) under the tongue every 5 (five) minutes as needed for chest pain.   spironolactone 25 MG tablet Commonly known as: ALDACTONE TAKE 1 TABLET BY MOUTH ONCE DAILY . APPOINTMENT REQUIRED FOR FUTURE REFILLS   traMADol 50 MG tablet Commonly known as: ULTRAM TAKE 1 TABLET BY MOUTH EVERY 6 HOURS AS NEEDED   traZODone 50 MG tablet Commonly known as: DESYREL Take 0.5-1 tablets (25-50 mg total) by mouth at bedtime as needed for sleep.   warfarin 10 MG tablet Commonly known as: COUMADIN Take as directed. If you are unsure how to take this medication, talk to your nurse or doctor. Original instructions: Take 5 mg by mouth daily. Takes on Saturdays What changed: Another medication with the same name was changed. Make sure you understand how and when to take each.   warfarin 7.5 MG tablet Commonly known as: COUMADIN Take as directed. If you are unsure how to take this medication, talk to your nurse or doctor. Original instructions: Take 1 tablet (7.5 mg total) by mouth daily. What changed: additional instructions        Follow-up Information     Virginia Crews, MD Follow up in 1 week(s).    Specialty: Family Medicine Why: come to office to check INR on Monday. Contact information: [address] [place] [address] [phone]         [name], MD .   Specialty: Cardiology Contact information: [place] [address] [phone]                35 minutes Signed: Sharen Hones 05/30/2021, 10:05 AM

## 2021-05-30 NOTE — Progress Notes (Signed)
Vanessa Carlson for warfarin /heparin bridge  Indication: chest pain/ACS and mechanical heart valve/A Fib  No Known Allergies  Patient Measurements: Height: 5\' 2"  (157.5 cm) Weight: 102 kg (224 lb 13.9 oz) IBW/kg (Calculated) : 50.1 Heparin Dosing Weight: 74.4 kg   Vital Signs: Temp: 98.6 F (37 C) (10/01 0816) Temp Source: Oral (10/01 0816) BP: 134/84 (10/01 0816) Pulse Rate: 92 (10/01 0530)  Labs: Recent Labs    05/28/21 0317 05/28/21 1044 05/28/21 1730 05/29/21 1000 05/29/21 1843 05/30/21 0100 05/30/21 0628  HGB 11.2*  --   --  10.4*  --   --  10.8*  HCT 34.1*  --   --  31.0*  --   --  33.0*  PLT 118*  --   --  92*  --   --  110*  LABPROT  --   --  18.1* 17.3*  --   --  21.6*  INR  --   --  1.5* 1.4*  --   --  1.9*  HEPARINUNFRC 0.28*   < >  --  0.16* 0.53 0.49  --   CREATININE 1.11*  --   --  0.98  --   --   --    < > = values in this interval not displayed.    Estimated Creatinine Clearance: 67.5 mL/min (by C-G formula based on SCr of 0.98 mg/dL).   Medical History: Past Medical History:  Diagnosis Date   AICD (automatic cardioverter/defibrillator) present    Anterior myocardial infarction [facility])    a. 10/2014 - occluded LAD, complicated by cardiogenic shock-->Med Rx as interventional team was unable to open LAD.   Cancer of upper lobe of right lung ([place]) 09/2017   radiation therapy right side   Cervical cancer ([place])    in remission for ~20 yrs, s/p hysterectomy   Chronic combined systolic and diastolic CHF (congestive heart failure) ([place])    a. 10/2015 Echo: EF 20-25%, sev diast dysfxn, mildly reduced RV fxn. Nl MV prosthesis; b. 12/2017 Echo: EF 20-25%, diff HK. Sev ant/antsept HK, apical AK. Mild MS (mean graad 55mmHg). Mod TR. PASP 63mmHg.   Coronary artery disease    a. 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   Essential hypertension     Ischemic cardiomyopathy    a. 03/2015 s/p MDT single lead AICD;  b. 10/2015 Echo: EF 20-25%;  c. 12/2017 Echo: EF 20-25%, diff HK.   Mass of upper lobe of right lung    a. noted on CXR & CT 08/2017 w/ abnl PET CT.   Mitral valve disease    a. 2006 s/p MVR w/ 71mm SJM bileaflet mechanical valve-->chronic coumadin/ASA; b. 12/2017 Echo: Mild MS. Mean gradient 70mmHg.   Personal history of radiation therapy    f/u lung ca    Assessment: 60yo Female with h/o mixed dia/sysCHF, iCMP (s/p AICD), RUL lung CAN (s/p RAD Tx over 20 years ago), 1 HTN and CAD, presenting with new fever and chills since yesterday with associated urinary frequency and urgency as well as left back pain with flank pain. Troponins were elevated and pharmacy consulted to dose heparin in this during r/o for ACS/NSTEMI. She was on warfarin 7.5 mg PO daily except 5 mg on Saturdays at home for mechanical heart valve/A Fib.  H&H, platelets stable.  DDIs: ceftriaxone (increases INR), asa (inc risk of bleeding,   Date/Time HL Interpretation  09/28 1131 0.43  Therapeutic x1 09/28 1859 0.30 Therapeutic x2 09/29 0317 0.28 Subthera 09/29 1044 0.31 Thera x1 09/29 1713 0.27 Subthera 09/30 1000 0.16 Subthera, Bolus 2200 units and increase rate 1200>1450 units/hr 09/30 1843 0.53  10/01 0100 0.49    Date INR Dose 9/28  2.2  9/29  1.5 10mg  9/30  1.4 10mg  9/30 1.9 7.5 mg   Goal of Therapy:  Heparin level 0.3-0.7 units/ml INR 2.5 - 3.5  Monitor platelets by anticoagulation protocol: Yes   Plan:   Warfarin INR is subtherapeutic. Will give warfarin 7.5 mg x 1 tonight. INR bumped from 1.4 to 1.9 from 10 mg dose, will expect INR to trend up tomorrow. Ordered INR daily and CBC at least every 3 days.    Heparin Heparin level is therapeutic x 2. Will continue heparin infusion at 1450 units/hr. Recheck heparin level with AM labs. CBC daily while on heparin. Stop heparin bridge once INR > 2.5.   Oswald Hillock, PharmD 05/30/2021 9:00  AM

## 2021-05-30 NOTE — Progress Notes (Signed)
[place] for warfarin /heparin bridge  Indication: chest pain/ACS  No Known Allergies  Patient Measurements: Height: 5\' 2"  (157.5 cm) Weight: 102 kg (224 lb 13.9 oz) IBW/kg (Calculated) : 50.1 Heparin Dosing Weight: 74.4 kg   Vital Signs: Temp: 98.8 F (37.1 C) (09/30 2150) Temp Source: Oral (09/30 1601) BP: 129/71 (09/30 2150) Pulse Rate: 100 (09/30 2150)  Labs: Recent Labs     0000 05/27/21 0225 05/27/21 0525 05/27/21 1131 05/28/21 0317 05/28/21 1044 05/28/21 1730 05/29/21 1000 05/29/21 1843 05/30/21 0100  HGB   < >  --  10.9*  --  11.2*  --   --  10.4*  --   --   HCT  --   --  33.1*  --  34.1*  --   --  31.0*  --   --   PLT  --   --  129*  --  118*  --   --  92*  --   --   LABPROT  --   --  24.3*  --   --   --  18.1* 17.3*  --   --   INR  --   --  2.2*  --   --   --  1.5* 1.4*  --   --   HEPARINUNFRC  --   --   --    < > 0.28*   < >  --  0.16* 0.53 0.49  CREATININE  --   --  1.39*  --  1.11*  --   --  0.98  --   --   TROPONINIHS  --  127*  --   --   --   --   --   --   --   --    < > = values in this interval not displayed.    Estimated Creatinine Clearance: 67.5 mL/min (by C-G formula based on SCr of 0.98 mg/dL).   Medical History: Past Medical History:  Diagnosis Date   AICD (automatic cardioverter/defibrillator) present    Anterior myocardial infarction [facility])    a. 10/2014 - occluded LAD, complicated by cardiogenic shock-->Med Rx as interventional team was unable to open LAD.   Cancer of upper lobe of right lung ([place]) 09/2017   radiation therapy right side   Cervical cancer ([place])    in remission for ~20 yrs, s/p hysterectomy   Chronic combined systolic and diastolic CHF (congestive heart failure) ([place])    a. 10/2015 Echo: EF 20-25%, sev diast dysfxn, mildly reduced RV fxn. Nl MV prosthesis; b. 12/2017 Echo: EF 20-25%, diff HK. Sev ant/antsept HK, apical AK. Mild MS (mean graad 49mmHg). Mod TR. PASP 2mmHg.   Coronary  artery disease    a. 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   Essential hypertension    Ischemic cardiomyopathy    a. 03/2015 s/p MDT single lead AICD;  b. 10/2015 Echo: EF 20-25%;  c. 12/2017 Echo: EF 20-25%, diff HK.   Mass of upper lobe of right lung    a. noted on CXR & CT 08/2017 w/ abnl PET CT.   Mitral valve disease    a. 2006 s/p MVR w/ 35mm SJM bileaflet mechanical valve-->chronic coumadin/ASA; b. 12/2017 Echo: Mild MS. Mean gradient 96mmHg.   Personal history of radiation therapy    f/u lung ca    Assessment: 60yo Female with h/o mixed dia/sysCHF, iCMP (s/p AICD), RUL  lung CAN (s/p RAD Tx over 20 years ago), 1 HTN and CAD, presenting with new fever and chills since yesterday with associated urinary frequency and urgency as well as left back pain with flank pain. Troponins were elevated and pharmacy consulted to dose heparin in this during r/o for ACS/NSTEMI. She was on warfarin 7.5 mg PO daily except 5 mg on Saturdays at home for mechanical heart valve/A Fib.  H&H, platelets stable.  DDIs: ceftriaxone (increases INR)  Date/Time HL Interpretation  09/28 1131 0.43 Therapeutic x1 09/28 1859 0.30 Therapeutic x2 09/29 0317 0.28 Subthera 09/29 1044 0.31 Thera x1 09/29 1713 0.27 Subthera 09/30 1000 0.16 Subthera, lab was collected late. Confirmed with nurse no issues with line/infusion. Bolus 2200 units and increase rate 1200>1450 units/hr 09/30 1843 0.53 Therapeutic 10/01 0100     0.49      Therapeutic X 2    Date/Time INR Dose 9/28 0525 2.2  9/29 1730 1.5 10mg  9/30 1000 1.4 10mg   Goal of Therapy:  Heparin level 0.3-0.7 units/ml INR 2.5 - 3.5  Monitor platelets by anticoagulation protocol: Yes   Plan:   10/1:  HL @ 0100 = 0.49, therapeutic X 2  Will continue pt on current rate and recheck HL on 10/2 @ 0500.   Marston Mccadden D, PharmD 05/30/2021 1:39 AM

## 2021-05-30 NOTE — Plan of Care (Signed)

## 2021-06-01 ENCOUNTER — Ambulatory Visit: Payer: Medicare HMO | Admitting: Family Medicine

## 2021-06-01 ENCOUNTER — Ambulatory Visit: Payer: Medicare HMO

## 2021-06-01 ENCOUNTER — Other Ambulatory Visit: Payer: Self-pay

## 2021-06-01 ENCOUNTER — Telehealth: Payer: Self-pay

## 2021-06-01 DIAGNOSIS — Z952 Presence of prosthetic heart valve: Secondary | ICD-10-CM

## 2021-06-01 DIAGNOSIS — I480 Paroxysmal atrial fibrillation: Secondary | ICD-10-CM

## 2021-06-01 LAB — POCT INR: INR: 3.4 — AB (ref 2.0–3.0)

## 2021-06-01 NOTE — Telephone Encounter (Signed)
Patient was wanted to come in today for just a PT finger stick. Just a nurse visit.

## 2021-06-01 NOTE — Telephone Encounter (Signed)
Please advise 

## 2021-06-01 NOTE — Telephone Encounter (Signed)
Copied from [place] [id number]. Topic: Appointment Scheduling - Scheduling Inquiry for Clinic >> Jun 01, 2021  9:35 AM Lennox Solders wrote: Reason for CRM: Pt has an appt for 06-03-2021 for PT/INR and would like to have her PT/INR check today. Pt said levofloxacin abx can interact with her coumadin medication

## 2021-06-01 NOTE — Patient Instructions (Signed)
Description   No Changes. 7.5 mg daily except 5 mg on Sat.

## 2021-06-01 NOTE — Telephone Encounter (Signed)
Patient was advised.  

## 2021-06-01 NOTE — Progress Notes (Signed)
Visited ED on 05/27/21 due to E.Coli. Was given antibiotic (Levaquin) and began Saturday. Will take antibiotic for 5 days. Advised to return in 2 weeks to recheck INR due to currently taking an antibiotic

## 2021-06-01 NOTE — Telephone Encounter (Signed)
Please advise if we can add patient on your schedule for a nurse visit for PT/ INR check. Patient was recently in the hospital for severe sepsis and started on antibiotics. Her last PT/INR was 2 days ago and the result was 1.9.

## 2021-06-03 ENCOUNTER — Ambulatory Visit: Payer: Medicare HMO

## 2021-06-05 ENCOUNTER — Ambulatory Visit: Payer: Medicare HMO | Admitting: Internal Medicine

## 2021-06-05 ENCOUNTER — Other Ambulatory Visit: Payer: Self-pay

## 2021-06-05 ENCOUNTER — Encounter: Payer: Self-pay | Admitting: Internal Medicine

## 2021-06-05 VITALS — BP 100/60 | HR 82 | Ht 62.5 in | Wt 224.0 lb

## 2021-06-05 DIAGNOSIS — Z952 Presence of prosthetic heart valve: Secondary | ICD-10-CM | POA: Diagnosis not present

## 2021-06-05 DIAGNOSIS — I5022 Chronic systolic (congestive) heart failure: Secondary | ICD-10-CM

## 2021-06-05 DIAGNOSIS — I2510 Atherosclerotic heart disease of native coronary artery without angina pectoris: Secondary | ICD-10-CM

## 2021-06-05 DIAGNOSIS — E785 Hyperlipidemia, unspecified: Secondary | ICD-10-CM | POA: Diagnosis not present

## 2021-06-05 NOTE — Patient Instructions (Signed)

## 2021-06-05 NOTE — Progress Notes (Signed)
Follow-up Outpatient Visit Date: 06/05/2021  Primary Care Provider: Virginia Crews, MD [address] [place] [address]  Chief Complaint: Follow-up CAD, HF, and MVR  HPI:  Ms. Pedregon is a 60 y.o. 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  I last saw her in June, at which time she felt about the same as at prior visits, noting occasional "twinges" of chest pain.  Chronic ankle swelling was stable.  She presented to the [facility] emergency department on 05/27/2021 with fevers and chills and was found to have E. coli bacteremia secondary to pyelonephritis complicated by sepsis.  She was treated with IV antibiotics under the direction of the infectious disease consultation service and discharged on 05/30/2021 with a 5-day course of levofloxacin.  INR was checked on 06/01/2021 and found to be at the upper [place] of target at 3.4.  Today, Ms. Deike reports that she is back to her baseline with resolution of fevers/chills and vague back pain that she presented with.  She has not had any chest pain, shortness of breath, palpitations, lightheadedness, and bleeding.  Mild chronic leg edema is stable.  She is scheduled for INR follow-up with her PCP in another week or two.  She is tolerating her medications well.  --------------------------------------------------------------------------------------------------  Cardiovascular History & Procedures: Cardiovascular Problems: Coronary artery disease status post anterior STEMI (2016) Ischemic cardiomyopathy with  chronic systolic heart failure Rheumatic heart disease status post mechanical mitral valve replacement (2006)   Risk Factors: Known coronary artery disease and hypertension   Cath/PCI:  LHC (2016, [place]): Reportedly proximal occlusion of the LAD, which could not be crossed with a wire.   CV Surgery: Mitral valve replacement (2006, [facility]): 27 mm [employer] bileaflet valve   EP Procedures and Devices: ICD (04/01/15, [facility]): Medtronic single-chamber ICD   Non-Invasive Evaluation(s): TTE (01/05/18): Mildly dilated LV.  LVEF 20 to 25% with diffuse hypokinesis and severe hypokinesis of the anterior and anteroseptal myocardium.  Apical akinesis.  Mechanical mitral valve with mean gradient of 4 mmHg.  Mild left atrial enlargement.  Normal RV function.  Moderate TR.  PASP 43 mmHg. TTE (11/20/15): Normal LV size and wall thickness. LVEF 20-25% with severe diastolic dysfunction. Mildly reduced RV contraction. Normal MV prosthesis function.  Recent CV Pertinent Labs: Lab Results  Component Value Date   CHOL 99 04/14/2021   CHOL 126 12/14/2019   HDL 34 (L) 04/14/2021   HDL 48 12/14/2019   LDLCALC 52 04/14/2021   LDLCALC 61 12/14/2019   LDLCALC 66 07/08/2017   TRIG 67 04/14/2021   CHOLHDL 2.9 04/14/2021   INR 3.4 (A) 06/01/2021   INR 1.9 (H) 05/30/2021   K 3.6 05/29/2021   BUN 11 05/29/2021   BUN 14 12/19/2019   CREATININE 0.98 05/29/2021   CREATININE 1.14 (H) 07/08/2017    Past medical and surgical history were reviewed and updated in EPIC.  Current Meds  Medication Sig   aspirin 81 MG chewable tablet Chew 81 mg by mouth daily.   atorvastatin (LIPITOR) 80 MG tablet TAKE 1 TABLET BY MOUTH ONCE DAILY. OFFICE VISIT AND LABS NEEDED FOR FURTHER REFILLS.   cyclobenzaprine (FLEXERIL)  5 MG tablet Take 1 tablet (5 mg total) by mouth 3 (three) times daily as needed for muscle spasms.   diclofenac Sodium (VOLTAREN) 1 % GEL Apply topically as needed.   ENTRESTO 24-26 MG Take 1 tablet by mouth twice  daily   ezetimibe (ZETIA) 10 MG tablet Take 1 tablet (10 mg total) by mouth daily.   fluticasone (FLONASE) 50 MCG/ACT nasal spray Place 2 sprays into both nostrils as needed for allergies or rhinitis.   furosemide (LASIX) 40 MG tablet Take 2 tablets by mouth twice daily   gabapentin (NEURONTIN) 300 MG capsule Take 1 capsule (300 mg total) by mouth 2 (two) times daily.   metoprolol succinate (TOPROL-XL) 25 MG 24 hr tablet Take 1 tablet (25 mg total) by mouth in the morning and at bedtime. Take with or immediately following a meal.   nitroGLYCERIN (NITROSTAT) 0.3 MG SL tablet Place 1 tablet (0.3 mg total) under the tongue every 5 (five) minutes as needed for chest pain.   spironolactone (ALDACTONE) 25 MG tablet TAKE 1 TABLET BY MOUTH ONCE DAILY . APPOINTMENT REQUIRED FOR FUTURE REFILLS   traMADol (ULTRAM) 50 MG tablet TAKE 1 TABLET BY MOUTH EVERY 6 HOURS AS NEEDED   traZODone (DESYREL) 50 MG tablet Take 0.5-1 tablets (25-50 mg total) by mouth at bedtime as needed for sleep.   warfarin (COUMADIN) 10 MG tablet Take 5 mg by mouth daily. Takes on Saturdays   warfarin (COUMADIN) 7.5 MG tablet 7.5 mg by mouth everyday except Saturdays    Allergies: Patient has no known allergies.  Social History   Tobacco Use   Smoking status: Former    Packs/day: 1.00    Years: 4.00    Pack years: 4.00    Types: Cigarettes    Quit date: 1998    Years since quitting: 24.7   Smokeless tobacco: Never  Vaping Use   Vaping Use: Never used  Substance Use Topics   Alcohol use: No   Drug use: No    Family History  Problem Relation Age of Onset   Heart attack Mother    Hypertension Mother    Diabetes Mother    Emphysema Father        smoker   Diabetes Maternal Grandmother    Kidney disease Maternal Grandmother    Breast cancer Maternal Aunt    Breast cancer Maternal Aunt    Colon cancer Maternal Uncle     Review of Systems: A 12-system review of systems was performed and was negative except as noted  in the HPI.  --------------------------------------------------------------------------------------------------  Physical Exam: BP 100/60 (BP Location: Left Arm, Patient Position: Sitting, Cuff Size: Large)   Pulse 82   Ht 5' 2.5" (1.588 m)   Wt 224 lb (101.6 kg)   LMP  (LMP Unknown)   SpO2 98%   BMI 40.32 kg/m   General:  NAD. Neck: No JVD or HJR, though body habitus limits evaluation. Lungs: Clear to auscultation bilaterally without wheezes or crackles. Heart: Regular rate and rhythm with mechanical S1.  No murmurs, rubs, or gallops. Abdomen: Soft, nontender, nondistended. Extremities: Trace pretibial edema.  EKG:  Normal sinus rhythm without abnormality.  Lab Results  Component Value Date   WBC 7.7 05/30/2021   HGB 10.8 (L) 05/30/2021   HCT 33.0 (L) 05/30/2021   MCV 88.7 05/30/2021   PLT 110 (L) 05/30/2021    Lab Results  Component Value Date   NA 137 05/29/2021   K 3.6 05/29/2021   CL 107  05/29/2021   CO2 23 05/29/2021   BUN 11 05/29/2021   CREATININE 0.98 05/29/2021   GLUCOSE 129 (H) 05/29/2021   ALT 20 05/27/2021    Lab Results  Component Value Date   CHOL 99 04/14/2021   HDL 34 (L) 04/14/2021   LDLCALC 52 04/14/2021   TRIG 67 04/14/2021   CHOLHDL 2.9 04/14/2021    --------------------------------------------------------------------------------------------------  ASSESSMENT AND PLAN: CAD: No angina reported.  Continue current medications for secondary prevention.  Chronic HFrEF: Ms. Keeran reports stable NYHA class I-II sx and mild chronic leg edema.  BP is low-normal today (asymptomatic).  Continue current regimen of metoprolol succinate, Entresto, and spironolactone.  Given recent urosepsis, I am reluctant to challenge her with an SGLT-2 inhibitor at this time.  History of mechanic MVR: No symptoms or exam findings to suggest valve problems.  Continue ASA and warfarin with close monitoring of her INR through her PCP in the setting of recent  bacteremia and antibiotic use.  Hyperlipidemia: LDL at goal with addition of ezetimibe after our last visit.  Continue current regimen of atorvastatin 80 mg daily and ezetimibe 10 mg daily.  Morbid obesity: BMI remains > 40.  Weight loss encouraged.  Follow-up: Return to clinic in 6 months.  [name], MD 06/05/2021 8:22 AM

## 2021-06-15 ENCOUNTER — Other Ambulatory Visit: Payer: Self-pay

## 2021-06-15 ENCOUNTER — Ambulatory Visit (INDEPENDENT_AMBULATORY_CARE_PROVIDER_SITE_OTHER): Payer: Medicare HMO

## 2021-06-15 ENCOUNTER — Ambulatory Visit
Admission: RE | Admit: 2021-06-15 | Discharge: 2021-06-15 | Disposition: A | Discharge status: Home / Self Care | Payer: Medicare HMO | Source: Ambulatory Visit | Attending: Radiation Oncology | Admitting: Radiation Oncology

## 2021-06-15 DIAGNOSIS — I5022 Chronic systolic (congestive) heart failure: Secondary | ICD-10-CM | POA: Diagnosis not present

## 2021-06-15 DIAGNOSIS — C3411 Malignant neoplasm of upper lobe, right bronchus or lung: Secondary | ICD-10-CM

## 2021-06-15 DIAGNOSIS — I700 Atherosclerosis of aorta: Secondary | ICD-10-CM | POA: Diagnosis not present

## 2021-06-15 DIAGNOSIS — Z95810 Presence of automatic (implantable) cardiac defibrillator: Secondary | ICD-10-CM | POA: Diagnosis not present

## 2021-06-15 DIAGNOSIS — C3490 Malignant neoplasm of unspecified part of unspecified bronchus or lung: Secondary | ICD-10-CM | POA: Diagnosis not present

## 2021-06-15 DIAGNOSIS — J439 Emphysema, unspecified: Secondary | ICD-10-CM | POA: Diagnosis not present

## 2021-06-15 MED ORDER — IOHEXOL 300 MG/ML  SOLN
75.0000 mL | Freq: Once | INTRAMUSCULAR | Status: AC | PRN
  Administered 2021-06-15: 75 mL via INTRAVENOUS

## 2021-06-16 ENCOUNTER — Ambulatory Visit: Payer: Medicare HMO | Admitting: Family Medicine

## 2021-06-17 ENCOUNTER — Other Ambulatory Visit: Payer: Self-pay

## 2021-06-17 ENCOUNTER — Ambulatory Visit (INDEPENDENT_AMBULATORY_CARE_PROVIDER_SITE_OTHER): Payer: Medicare HMO

## 2021-06-17 DIAGNOSIS — Z952 Presence of prosthetic heart valve: Secondary | ICD-10-CM | POA: Diagnosis not present

## 2021-06-17 LAB — POCT INR
INR: 3.3 — AB (ref 2.0–3.0)
INR: 3.3 — AB (ref 2.0–3.0)

## 2021-06-17 MED ORDER — WARFARIN SODIUM 7.5 MG PO TABS: 7.5000 mg | ORAL_TABLET | Freq: Every day | ORAL | 0 refills | Status: DC

## 2021-06-17 NOTE — Patient Instructions (Signed)
Description   No Changes. 7.5 mg daily except 5 mg on Sat.

## 2021-06-19 ENCOUNTER — Ambulatory Visit: Payer: Medicare HMO | Admitting: Radiation Oncology

## 2021-06-19 NOTE — Progress Notes (Signed)
EPIC Encounter for ICM Monitoring  Patient Name: Vanessa Carlson is a 60 y.o. female Date: 06/19/2021 Primary Care Physican: Virginia Crews, MD Primary Cardiologist: Carlis Abbott, NP HF Electrophysiologist: Caryl Comes 06/05/2021 Office Weight: 224 lbs        Transmission reviewed.    Optivol thoracic impedance suggesting normal fluid levels but was suggesting possible fluid accumulatio nfrom 9/25-9/30 which correlates with hospitalization.    Prescribed:  Furosemide 40 mg 2 tablets (80 mg total) twice a day.   Spironolactone 25 mg take 1 tablet daily   Labs: 05/29/2021 Creatinine 0.98, BUN 11, Potassium 3.6, Sodium 137, GFR >60 05/28/2021 Creatinine 1.11, BUN 15, Potassium 3.5, Sodium 142, GFR 57 05/27/2021 Creatinine 1.39, BUN 18, Potassium 3.0, Sodium 138, GFR 43 02/20/2021 Creatinine 0.98, BUN 13, Potassium 4.4, Sodium 138, GFR >60 09/30/2020 Creatinine 0.97, BUN 13, Potassium 4.5, Sodium 140, GFR >60 A complete set of results can be found in Results Review.   Recommendations: No changes.   Follow-up plan: ICM clinic phone appointment on 07/27/2021.   91 day device clinic remote transmission 07/07/2021.       EP/Cardiology Office Visits:  Recall 01/07/2021 with Dr. Caryl Comes.  Recall 06/04/2021 with Dr End or APP   Copy of ICM check sent to Dr. Caryl Comes.     3 month ICM trend: 06/15/2021.    1 Year ICM trend:       [name], RN 06/19/2021 9:56 AM

## 2021-06-22 ENCOUNTER — Ambulatory Visit: Payer: Medicare HMO | Admitting: Radiation Oncology

## 2021-06-24 ENCOUNTER — Encounter (HOSPITAL_COMMUNITY): Payer: Self-pay

## 2021-06-24 ENCOUNTER — Ambulatory Visit (HOSPITAL_COMMUNITY): Admission: RE | Admit: 2021-06-24 | Payer: Medicare HMO | Source: Ambulatory Visit

## 2021-06-26 ENCOUNTER — Inpatient Hospital Stay: Payer: Medicare HMO | Attending: Oncology | Admitting: Oncology

## 2021-06-26 ENCOUNTER — Ambulatory Visit
Admission: RE | Admit: 2021-06-26 | Discharge: 2021-06-26 | Disposition: A | Discharge status: Home / Self Care | Payer: Medicare HMO | Source: Ambulatory Visit | Attending: Radiation Oncology | Admitting: Radiation Oncology

## 2021-06-26 ENCOUNTER — Encounter: Payer: Self-pay | Admitting: Oncology

## 2021-06-26 ENCOUNTER — Other Ambulatory Visit: Payer: Self-pay

## 2021-06-26 VITALS — BP 125/65 | HR 87 | Temp 96.0°F | Resp 17 | Ht 63.0 in | Wt 223.1 lb

## 2021-06-26 VITALS — BP 125/65 | HR 87 | Temp 96.0°F | Resp 18 | Wt 223.0 lb

## 2021-06-26 DIAGNOSIS — C3411 Malignant neoplasm of upper lobe, right bronchus or lung: Secondary | ICD-10-CM | POA: Insufficient documentation

## 2021-06-26 DIAGNOSIS — Z85118 Personal history of other malignant neoplasm of bronchus and lung: Secondary | ICD-10-CM | POA: Diagnosis not present

## 2021-06-26 DIAGNOSIS — Z923 Personal history of irradiation: Secondary | ICD-10-CM | POA: Insufficient documentation

## 2021-06-26 DIAGNOSIS — Z08 Encounter for follow-up examination after completed treatment for malignant neoplasm: Secondary | ICD-10-CM | POA: Diagnosis not present

## 2021-06-26 NOTE — Progress Notes (Signed)
Patient here for follow up today. Patient also saw Dr. Baruch Gouty in follow up today. Patient denies having any pain. No complaints at this time.

## 2021-06-26 NOTE — Progress Notes (Signed)
Survivorship Care Plan visit completed.  Treatment summary reviewed and given to patient.  ASCO answers booklet reviewed and given to patient.  CARE program and Cancer Transitions discussed with patient along with other resources cancer center offers to patients and caregivers.  Patient verbalized understanding.    

## 2021-06-26 NOTE — Progress Notes (Signed)
Radiation Oncology Follow up Note  Name: Vanessa Carlson   Date:   06/26/2021 MRN:  [id number] DOB: 1961-08-29    This 60 y.o. female presents to the clinic today for 107-month follow-up status post SBRT x2 to her right upper lobe adenocarcinoma.  Patient previous treated for non-small cell stage I cancer of the right upper lobe.[name]  REFERRING PROVIDER: Virginia Crews, MD  HPI: Patient is a 60 year old female now out 4 months from her second SBRT to her right upper lobe adenocarcinoma.  Seen today in routine follow-up she is doing well specifically denies cough hemoptysis or chest tightness.  Recent CT scan shows redemonstrated dense consolidation fibrosis in the paramedian suprahilar right upper lobe.  Area of FDG avid focus anteriorly is diminished and fullness consistent with treatment response.  No evidence of lymphadenopathy or metastatic disease is noted.  COMPLICATIONS OF TREATMENT: none  FOLLOW UP COMPLIANCE: keeps appointments   PHYSICAL EXAM:  BP 125/65   Pulse 87   Temp (!) 96 F (35.6 C)   Resp 17   Ht 5\' 3"  (1.6 m)   Wt 223 lb 1.6 oz (101.2 kg)   LMP  (LMP Unknown)   BMI 39.52 kg/m  Well-developed well-nourished patient in NAD. HEENT reveals PERLA, EOMI, discs not visualized.  Oral cavity is clear. No oral mucosal lesions are identified. Neck is clear without evidence of cervical or supraclavicular adenopathy. Lungs are clear to A&P. Cardiac examination is essentially unremarkable with regular rate and rhythm without murmur rub or thrill. Abdomen is benign with no organomegaly or masses noted. Motor sensory and DTR levels are equal and symmetric in the upper and lower extremities. Cranial nerves II through XII are grossly intact. Proprioception is intact. No peripheral adenopathy or edema is identified. No motor or sensory levels are noted. Crude visual fields are within normal range.  RADIOLOGY RESULTS: CT scans reviewed compatible with above-stated findings  PLAN:  Present time patient is doing well clinically she is very low side effect profile.  She continues to have dense scarring which we would expect on her CT scan.  At this time we will see her back in 6 months for follow-up with a repeat CT scan.  Should there be any questions of any progression would offer PET/CT for clarification.  Patient comprehends my recommendations well.  I would like to take this opportunity to thank you for allowing me to participate in the care of your patient.Noreene Filbert, MD

## 2021-06-26 NOTE — Progress Notes (Signed)
Hematology/Oncology Consult note [facility]  [phone] [phone]  Patient Care Team: Virginia Crews, MD as PCP - General (Family Medicine) End, Harrell Gave, MD as PCP - Cardiology (Cardiology) Nestor Lewandowsky, MD (Inactive) as Referring Physician (Cardiothoracic Surgery) Sindy Guadeloupe, MD as Consulting Physician (Oncology) Sindy Guadeloupe, MD as Consulting Physician (Oncology) Noreene Filbert, MD as Consulting Physician (Radiation Oncology)   Name of the patient: Vanessa Carlson  [id number]  Jan 19, 1961   Date of visit: 06/26/21  Diagnosis- stage I lung cancer s/p SBRT  Chief complaint/ Reason for visit-routine follow-up of lung cancer  Heme/Onc history: Patient is a 60 year old female with a past medical history significant for CAD, ischemic cardiomyopathy status post AICD placement, rheumatic heart disease status post mechanical mitral valve replacement on Coumadin.She was found to have a right upper lobe lung mass 2.5 cm that was hypermetabolic on PET CT scan in January 2019.  No evidence of nodal involvement.  She was not deemed to be a candidate for surgery and underwent SBRT to this lesion.  This was biopsy-proven adenocarcinoma with acinar and solid morphologies.  She then had a CT scan in January 2020 which showed enlarging satellite right upper lobe lung nodules.  This was followed by a PET CT scan which showed right upper lobe lung nodules 2 of them were hypermetabolic with an SUV of 8.7 was also a thyroid nodule approximately 4 mm in size which was not definitely hypermetabolic.  Patient was seen by Dr. Donella Stade following this and sh received SBRT for 5 fractions encompassing all these 3 nodules.  Patient has not had any systemic chemotherapy for her lung cancer so far.  There is a gradual increase in the size of her right upper lobe lung nodules in May 2022 and patient received SBRT to this area again by Dr. Donella Stade  Interval  history-patient reports having tolerated radiation treatment well.  Denies any significant shortness of breath cough or sputum production  ECOG PS- 1 Pain scale- 0   Review of systems- Review of Systems  Constitutional:  Negative for chills, fever, malaise/fatigue and weight loss.  HENT:  Negative for congestion, ear discharge and nosebleeds.   Eyes:  Negative for blurred vision.  Respiratory:  Negative for cough, hemoptysis, sputum production, shortness of breath and wheezing.   Cardiovascular:  Negative for chest pain, palpitations, orthopnea and claudication.  Gastrointestinal:  Negative for abdominal pain, blood in stool, constipation, diarrhea, heartburn, melena, nausea and vomiting.  Genitourinary:  Negative for dysuria, flank pain, frequency, hematuria and urgency.  Musculoskeletal:  Negative for back pain, joint pain and myalgias.  Skin:  Negative for rash.  Neurological:  Negative for dizziness, tingling, focal weakness, seizures, weakness and headaches.  Endo/Heme/Allergies:  Does not bruise/bleed easily.  Psychiatric/Behavioral:  Negative for depression and suicidal ideas. The patient does not have insomnia.      No Known Allergies   Past Medical History:  Diagnosis Date   AICD (automatic cardioverter/defibrillator) present    Anterior myocardial infarction ([place])    a. 10/2014 - occluded LAD, complicated by cardiogenic shock-->Med Rx as interventional team was unable to open LAD.   Cancer of upper lobe of right lung ([place]) 09/2017   radiation therapy right side   Cervical cancer ([place])    in remission for ~20 yrs, s/p hysterectomy   Chronic combined systolic and diastolic CHF (congestive heart failure) ([place])    a. 10/2015 Echo: EF 20-25%, sev diast dysfxn, mildly reduced RV fxn.  Nl MV prosthesis; b. 12/2017 Echo: EF 20-25%, diff HK. Sev ant/antsept HK, apical AK. Mild MS (mean graad 60mmHg). Mod TR. PASP 45mmHg.   Coronary artery disease    a. 10/2014 Ant STEMI/Cath: LAD 100p  ->Med managed as lesion could not be crossed-->complicated by CGS and post-MI pericarditis ([facility]).   Essential hypertension    Ischemic cardiomyopathy    a. 03/2015 s/p MDT single lead AICD;  b. 10/2015 Echo: EF 20-25%;  c. 12/2017 Echo: EF 20-25%, diff HK.   Mass of upper lobe of right lung    a. noted on CXR & CT 08/2017 w/ abnl PET CT.   Mitral valve disease    a. 2006 s/p MVR w/ 52mm SJM bileaflet mechanical valve-->chronic coumadin/ASA; b. 12/2017 Echo: Mild MS. Mean gradient 36mmHg.   Personal history of radiation therapy    f/u lung ca     Past Surgical History:  Procedure Laterality Date   APPENDECTOMY  1998   BREAST EXCISIONAL BIOPSY Left 80s   benign   CARDIAC CATHETERIZATION     CARDIAC DEFIBRILLATOR PLACEMENT     COLONOSCOPY WITH PROPOFOL N/A 01/11/2020   Procedure: COLONOSCOPY WITH PROPOFOL;  Surgeon: Jonathon Bellows, MD;  Location: [facility] ENDOSCOPY;  Service: Gastroenterology;  Laterality: N/A;   RADICAL HYSTERECTOMY  1998   SHOULDER SURGERY Bilateral    rotator cuff tears   VALVE REPLACEMENT     Mitral valve; 27 mm St. Jude bileaflet valve    Social History   Socioeconomic History   Marital status: Married    Spouse name: Tosin   Number of children: 2   Years of education: 14   Highest education level: Some college, no degree  Occupational History   Occupation: disable  Tobacco Use   Smoking status: Former    Packs/day: 1.00    Years: 4.00    Pack years: 4.00    Types: Cigarettes    Quit date: 1998    Years since quitting: 24.8   Smokeless tobacco: Never  Vaping Use   Vaping Use: Never used  Substance and Sexual Activity   Alcohol use: No   Drug use: No   Sexual activity: Yes    Partners: Male    Birth control/protection: Surgical  Other Topics Concern   Not on file  Social History Narrative   Not on file   Social Determinants of Health   Financial Resource Strain: Low Risk    Difficulty of Paying Living Expenses: Not hard at all  Food Insecurity: No  Food Insecurity   Worried About Charity fundraiser in the Last Year: Never true   Ran Out of Food in the Last Year: Never true  Transportation Needs: No Transportation Needs   Lack of Transportation (Medical): No   Lack of Transportation (Non-Medical): No  Physical Activity: Inactive   Days of Exercise per Week: 0 days   Minutes of Exercise per Session: 0 min  Stress: No Stress Concern Present   Feeling of Stress : Not at all  Social Connections: Socially Integrated   Frequency of Communication with Friends and Family: More than three times a week   Frequency of Social Gatherings with Friends and Family: More than three times a week   Attends Religious Services: More than 4 times per year   Active Member of [employer] or Organizations: Yes   Attends Music therapist: More than 4 times per year   Marital Status: Married  Human resources officer Violence: Not At Risk  Fear of Current or Ex-Partner: No   Emotionally Abused: No   Physically Abused: No   Sexually Abused: No    Family History  Problem Relation Age of Onset   Heart attack Mother    Hypertension Mother    Diabetes Mother    Emphysema Father        smoker   Diabetes Maternal Grandmother    Kidney disease Maternal Grandmother    Breast cancer Maternal Aunt    Breast cancer Maternal Aunt    Colon cancer Maternal Uncle      Current Outpatient Medications:    aspirin 81 MG chewable tablet, Chew 81 mg by mouth daily., Disp: , Rfl:    atorvastatin (LIPITOR) 80 MG tablet, TAKE 1 TABLET BY MOUTH ONCE DAILY. OFFICE VISIT AND LABS NEEDED FOR FURTHER REFILLS., Disp: 90 tablet, Rfl: 3   cyclobenzaprine (FLEXERIL) 5 MG tablet, Take 1 tablet (5 mg total) by mouth 3 (three) times daily as needed for muscle spasms., Disp: 30 tablet, Rfl: 1   ENTRESTO 24-26 MG, Take 1 tablet by mouth twice daily, Disp: 180 tablet, Rfl: 0   ezetimibe (ZETIA) 10 MG tablet, Take 1 tablet (10 mg total) by mouth daily., Disp: 90 tablet, Rfl: 3    fluticasone (FLONASE) 50 MCG/ACT nasal spray, Place 2 sprays into both nostrils as needed for allergies or rhinitis., Disp: , Rfl:    furosemide (LASIX) 40 MG tablet, Take 2 tablets by mouth twice daily, Disp: 360 tablet, Rfl: 0   gabapentin (NEURONTIN) 300 MG capsule, Take 1 capsule (300 mg total) by mouth 2 (two) times daily., Disp: 180 capsule, Rfl: 3   metoprolol succinate (TOPROL-XL) 25 MG 24 hr tablet, Take 1 tablet (25 mg total) by mouth in the morning and at bedtime. Take with or immediately following a meal., Disp: 180 tablet, Rfl: 1   spironolactone (ALDACTONE) 25 MG tablet, TAKE 1 TABLET BY MOUTH ONCE DAILY . APPOINTMENT REQUIRED FOR FUTURE REFILLS, Disp: 30 tablet, Rfl: 0   traMADol (ULTRAM) 50 MG tablet, TAKE 1 TABLET BY MOUTH EVERY 6 HOURS AS NEEDED, Disp: 30 tablet, Rfl: 1   traZODone (DESYREL) 50 MG tablet, Take 0.5-1 tablets (25-50 mg total) by mouth at bedtime as needed for sleep., Disp: 30 tablet, Rfl: 3   warfarin (COUMADIN) 10 MG tablet, Take 5 mg by mouth daily. Takes on Saturdays, Disp: , Rfl:    warfarin (COUMADIN) 7.5 MG tablet, Take 1 tablet (7.5 mg total) by mouth daily at 4 PM. Take 1 tablet (7.5 mg total)  by mouth daily except Saturdays, Disp: 90 tablet, Rfl: 0   diclofenac Sodium (VOLTAREN) 1 % GEL, Apply topically as needed. (Patient not taking: Reported on 06/26/2021), Disp: , Rfl:    nitroGLYCERIN (NITROSTAT) 0.3 MG SL tablet, Place 1 tablet (0.3 mg total) under the tongue every 5 (five) minutes as needed for chest pain. (Patient not taking: Reported on 06/26/2021), Disp: 30 tablet, Rfl: 3  Physical exam:  Vitals:   06/26/21 0935 06/26/21 0940  BP: 125/65   Pulse: 87   Resp: 18   Temp: (!) 96 F (35.6 C)   TempSrc: Tympanic   Weight:  223 lb (101.2 kg)   Physical Exam Constitutional:      General: She is not in acute distress. Cardiovascular:     Rate and Rhythm: Normal rate and regular rhythm.     Heart sounds: Normal heart sounds.  Pulmonary:      Effort: Pulmonary effort is normal.  Breath sounds: Normal breath sounds.  Abdominal:     General: Bowel sounds are normal.     Palpations: Abdomen is soft.  Skin:    General: Skin is warm and dry.  Neurological:     Mental Status: She is alert and oriented to person, place, and time.  With history of stage I lung cancer s/p SBRT  CMP Latest Ref Rng & Units 05/29/2021  Glucose 70 - 99 mg/dL 129(H)  BUN 8 - 23 mg/dL 11  Creatinine 0.44 - 1.00 mg/dL 0.98  Sodium 135 - 145 mmol/L 137  Potassium 3.5 - 5.1 mmol/L 3.6  Chloride 98 - 111 mmol/L 107  CO2 22 - 32 mmol/L 23  Calcium 8.9 - 10.3 mg/dL 8.5(L)  Total Protein 6.5 - 8.1 g/dL -  Total Bilirubin 0.3 - 1.2 mg/dL -  Alkaline Phos 38 - 126 U/L -  AST 15 - 41 U/L -  ALT 0 - 44 U/L -   CBC Latest Ref Rng & Units 05/30/2021  WBC 4.0 - 10.5 K/uL 7.7  Hemoglobin 12.0 - 15.0 g/dL 10.8(L)  Hematocrit 36.0 - 46.0 % 33.0(L)  Platelets 150 - 400 K/uL 110(L)    No images are attached to the encounter.  CT Chest W Contrast  Result Date: 06/16/2021 CLINICAL DATA:  Right upper lobe lung cancer restaging, status post salvage SBRT EXAM: CT CHEST WITH CONTRAST TECHNIQUE: Multidetector CT imaging of the chest was performed during intravenous contrast administration. CONTRAST:  59mL OMNIPAQUE IOHEXOL 300 MG/ML  SOLN COMPARISON:  PET-CT, 01/07/2021, CT chest, 11/25/2020 FINDINGS: Cardiovascular: Aortic atherosclerosis. Cardiomegaly. Three-vessel coronary artery calcifications. No pericardial effusion. Mediastinum/Nodes: No enlarged mediastinal, hilar, or axillary lymph nodes. Thyroid gland, trachea, and esophagus demonstrate no significant findings. Lungs/Pleura: Mild centrilobular emphysema. Redemonstrated dense consolidation and fibrosis of the paramedian suprahilar right upper lobe (series 3, image 42). Region of consolidation anteriorly, previously containing an FDG avid focus, is diminished in fullness, measuring approximately 5.2 x 1.5 cm,  previously 7.5 x 2.9 cm when measured similarly (series 2, image 45). Minimal scarring at the bilateral lung bases. No pleural effusion or pneumothorax. Upper Abdomen: There is a focal cortical hypodensity of the included anterior midportion of the left kidney (series 2, image 142). Musculoskeletal: No chest wall mass or suspicious bone lesions identified. IMPRESSION: 1. Redemonstrated dense consolidation and fibrosis of the paramedian suprahilar right upper lobe. Region of consolidation anteriorly, previously containing an FDG avid focus, is diminished in fullness, measuring approximately 5.2 x 1.5 cm, previously 7.5 x 2.9 cm when measured similarly. Findings are consistent with treatment response to radiation. PET-CT may be helpful to assess for residual FDG avid disease. 2. No evidence of lymphadenopathy or metastatic disease in the chest. 3. There is a focal cortical hypodensity of the included anterior midportion of the left kidney, possibly related to pyelonephritis given recent hospitalization for UTI and sepsis. 4. Coronary artery disease. Aortic Atherosclerosis (ICD10-I70.0). Electronically Signed   By: Delanna Ahmadi M.D.   On: 06/16/2021 11:24   US RENAL  Result Date: 05/29/2021 CLINICAL DATA:  Bacteremia EXAM: RENAL / URINARY TRACT ULTRASOUND COMPLETE COMPARISON:  None. FINDINGS: Right Kidney: Renal measurements: 10.9 x 3.8 x 5.2 cm = volume: 111.8 mL. Echogenicity within normal limits. No mass or hydronephrosis visualized. Left Kidney: Renal measurements: 11.1 x 5.3 x 5.4 cm = volume: 169.3 mL. Echogenicity within normal limits. No mass or hydronephrosis visualized. Bladder: Poorly distended and not well evaluated. Patient voided prior to exam. Other: None. IMPRESSION: No hydronephrosis.  Electronically Signed   By: Macy Mis M.D.   On: 05/29/2021 11:35     Assessment and plan- Patient is a 60 y.o. female Here to discuss CT scan results and further management   Patient was seen by Dr.  Donella Stade as well today.  She had a CT chest on 06/15/2021 which showed reduction in the size of the right upper lobe consolidation and fibrosis now measuring 5.2 x 1.5 cm compared to 7.5 x 2.9 cm.  Findings consistent with treatment response and no evidence of locoregional adenopathy.  I do plan to get a repeat CT in about 3 months Changes are stable I will plan to get CTs every 6 months.   Visit Diagnosis 1. Malignant neoplasm of upper lobe of right lung [facility])      Dr. Randa Evens, MD, MPH [facility] [phone] 06/26/2021 12:39 PM

## 2021-06-27 ENCOUNTER — Other Ambulatory Visit: Payer: Self-pay | Admitting: Family Medicine

## 2021-06-27 NOTE — Telephone Encounter (Signed)
Requested Prescriptions  Pending Prescriptions Disp Refills  . spironolactone (ALDACTONE) 25 MG tablet [Pharmacy Med Name: Spironolactone 25 MG Oral Tablet] 6 tablet 0    Sig: TAKE 1 TABLET BY MOUTH ONCE DAILY. APPOINTMENT NEEDED FOR FURTHER REFILLS.     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, Clearnce Sorrel, [place]      Future Appointments            In 5 days Gwyneth Sprout, [place], [facility]           Passed - Cr in normal range and within 360 days    Creat  Date Value Ref Range Status  07/08/2017 1.14 (H) 0.50 - 1.05 mg/dL Final    Comment:    For patients >62 years of age, the reference limit for Creatinine is approximately 13% higher for people identified as African-American. .    Creatinine, Ser  Date Value Ref Range Status  05/29/2021 0.98 0.44 - 1.00 mg/dL Final         Passed - K in normal range and within 360 days    Potassium  Date Value Ref Range Status  05/29/2021 3.6 3.5 - 5.1 mmol/L Final         Passed - Na in normal range and within 360 days    Sodium  Date Value Ref Range Status  05/29/2021 137 135 - 145 mmol/L Final  12/19/2019 145 (H) 134 - 144 mmol/L Final         Passed - Last BP in normal range    BP Readings from Last 1 Encounters:  06/26/21 125/65

## 2021-06-29 ENCOUNTER — Telehealth: Payer: Self-pay

## 2021-06-29 NOTE — Telephone Encounter (Signed)
Copied from [place] [id number]. Topic: Appointment Scheduling - Scheduling Inquiry for Clinic >> Jun 29, 2021  4:19 PM Loma [place] wrote: Pt has an an appt with Dickie La for a CPE on 11/3 Pt wants to resch pls fu at [phone]

## 2021-06-30 NOTE — Telephone Encounter (Signed)
Called and spoke with patient and moved date of PE to 08/04/21. KW

## 2021-07-01 ENCOUNTER — Ambulatory Visit (HOSPITAL_COMMUNITY): Admission: RE | Admit: 2021-07-01 | Payer: Medicare HMO | Source: Ambulatory Visit

## 2021-07-02 ENCOUNTER — Encounter: Payer: Medicare HMO | Admitting: Family Medicine

## 2021-07-07 ENCOUNTER — Ambulatory Visit (INDEPENDENT_AMBULATORY_CARE_PROVIDER_SITE_OTHER): Payer: Medicare HMO

## 2021-07-07 DIAGNOSIS — I255 Ischemic cardiomyopathy: Secondary | ICD-10-CM

## 2021-07-07 LAB — CUP PACEART REMOTE DEVICE CHECK
Battery Remaining Longevity: 69 mo
Battery Voltage: 2.99 V
Brady Statistic RV Percent Paced: 0 %
Date Time Interrogation Session: 20221108033524
HighPow Impedance: 71 Ohm
Implantable Lead Implant Date: 20160802
Implantable Lead Location: 753860
Implantable Pulse Generator Implant Date: 20160802
Lead Channel Impedance Value: 437 Ohm
Lead Channel Impedance Value: 551 Ohm
Lead Channel Pacing Threshold Amplitude: 0.5 V
Lead Channel Pacing Threshold Pulse Width: 0.4 ms
Lead Channel Sensing Intrinsic Amplitude: 11.625 mV
Lead Channel Sensing Intrinsic Amplitude: 11.625 mV
Lead Channel Setting Pacing Amplitude: 2 V
Lead Channel Setting Pacing Pulse Width: 0.4 ms
Lead Channel Setting Sensing Sensitivity: 0.3 mV

## 2021-07-11 ENCOUNTER — Other Ambulatory Visit: Payer: Self-pay | Admitting: Family Medicine

## 2021-07-11 DIAGNOSIS — M545 Low back pain, unspecified: Secondary | ICD-10-CM

## 2021-07-11 NOTE — Telephone Encounter (Signed)
Requested medication (s) are due for refill today: yes  Requested medication (s) are on the active medication list: yes  Last refill:  05/07/21 #30/1RF  Future visit scheduled: yes  Notes to clinic:  Unable to refill per protocol, cannot delegate.    Requested Prescriptions  Pending Prescriptions Disp Refills   traMADol (ULTRAM) 50 MG tablet [Pharmacy Med Name: traMADol HCl 50 MG Oral Tablet] 30 tablet 0    Sig: TAKE 1 TABLET BY MOUTH EVERY 6 HOURS AS NEEDED     Not Delegated - Analgesics:  Opioid Agonists Failed - 07/11/2021 11:14 AM      Failed - This refill cannot be delegated      Failed - Urine Drug Screen completed in last 360 days      Failed - Valid encounter within last 6 months    Recent Outpatient Visits           6 months ago Status post mitral valve replacement   [facility] [place], Dionne Bucy, MD   1 year ago Allergic conjunctivitis of right eye   [facility] [place], Dionne Bucy, MD   1 year ago Encounter for annual wellness visit (AWV) in Medicare patient   [place], Dionne Bucy, MD   1 year ago Muscle spasm   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

## 2021-07-12 ENCOUNTER — Other Ambulatory Visit: Payer: Self-pay | Admitting: Family Medicine

## 2021-07-13 MED ORDER — SPIRONOLACTONE 25 MG PO TABS: 25.0000 mg | ORAL_TABLET | Freq: Every day | ORAL | 0 refills | Status: DC

## 2021-07-13 NOTE — Telephone Encounter (Signed)
Last refill: 06/27/2021 LOV visit with provider:05/02/20 Future Visit:08/04/21 Vanessa Carlson

## 2021-07-14 NOTE — Progress Notes (Signed)
Remote ICD transmission.   

## 2021-07-15 ENCOUNTER — Ambulatory Visit: Payer: Medicare HMO | Admitting: Family Medicine

## 2021-07-22 ENCOUNTER — Ambulatory Visit (INDEPENDENT_AMBULATORY_CARE_PROVIDER_SITE_OTHER): Payer: Medicare HMO

## 2021-07-22 ENCOUNTER — Other Ambulatory Visit: Payer: Self-pay

## 2021-07-22 DIAGNOSIS — Z952 Presence of prosthetic heart valve: Secondary | ICD-10-CM | POA: Diagnosis not present

## 2021-07-22 LAB — POCT INR: INR: 3.4 — AB (ref 2.0–3.0)

## 2021-07-22 NOTE — Patient Instructions (Signed)
Description   7.5 mg daily except 5 mg on Sat.

## 2021-07-27 ENCOUNTER — Ambulatory Visit (INDEPENDENT_AMBULATORY_CARE_PROVIDER_SITE_OTHER): Payer: Medicare HMO

## 2021-07-27 DIAGNOSIS — I5022 Chronic systolic (congestive) heart failure: Secondary | ICD-10-CM | POA: Diagnosis not present

## 2021-07-27 DIAGNOSIS — Z95810 Presence of automatic (implantable) cardiac defibrillator: Secondary | ICD-10-CM | POA: Diagnosis not present

## 2021-07-28 NOTE — Progress Notes (Signed)
EPIC Encounter for ICM Monitoring  Patient Name: Vanessa Carlson is a 60 y.o. female Date: 07/28/2021 Primary Care Physican: Virginia Crews, MD Primary Cardiologist: Carlis Abbott, NP HF Electrophysiologist: Caryl Comes 06/05/2021 Office Weight: 224 lbs        Transmission reviewed.    Optivol thoracic impedance suggesting normal fluid levels.    Prescribed:  Furosemide 40 mg 2 tablets (80 mg total) twice a day.   Spironolactone 25 mg take 1 tablet daily   Labs: 05/29/2021 Creatinine 0.98, BUN 11, Potassium 3.6, Sodium 137, GFR >60 05/28/2021 Creatinine 1.11, BUN 15, Potassium 3.5, Sodium 142, GFR 57 05/27/2021 Creatinine 1.39, BUN 18, Potassium 3.0, Sodium 138, GFR 43 02/20/2021 Creatinine 0.98, BUN 13, Potassium 4.4, Sodium 138, GFR >60 09/30/2020 Creatinine 0.97, BUN 13, Potassium 4.5, Sodium 140, GFR >60 A complete set of results can be found in Results Review.   Recommendations: No changes.   Follow-up plan: ICM clinic phone appointment on 09/07/2021.   91 day device clinic remote transmission 10/06/2021.       EP/Cardiology Office Visits:  Recall 01/07/2021 with Dr. Caryl Comes.  Recall 06/04/2021 with Dr End or APP   Copy of ICM check sent to Dr. Caryl Comes.      3 month ICM trend: 07/27/2021.    12-14 Month ICM trend:       [name], RN 07/28/2021 12:18 PM

## 2021-08-01 ENCOUNTER — Other Ambulatory Visit: Payer: Self-pay | Admitting: Internal Medicine

## 2021-08-04 ENCOUNTER — Other Ambulatory Visit (HOSPITAL_COMMUNITY)
Admission: RE | Admit: 2021-08-04 | Discharge: 2021-08-04 | Disposition: A | Discharge status: Home / Self Care | Payer: Medicare HMO | Source: Ambulatory Visit | Attending: Family Medicine | Admitting: Family Medicine

## 2021-08-04 ENCOUNTER — Other Ambulatory Visit: Payer: Self-pay

## 2021-08-04 ENCOUNTER — Ambulatory Visit (INDEPENDENT_AMBULATORY_CARE_PROVIDER_SITE_OTHER): Payer: Medicare HMO | Admitting: Family Medicine

## 2021-08-04 ENCOUNTER — Encounter: Payer: Self-pay | Admitting: Family Medicine

## 2021-08-04 VITALS — BP 124/68 | HR 78 | Resp 16 | Ht 62.5 in | Wt 225.3 lb

## 2021-08-04 DIAGNOSIS — I5022 Chronic systolic (congestive) heart failure: Secondary | ICD-10-CM

## 2021-08-04 DIAGNOSIS — Z0000 Encounter for general adult medical examination without abnormal findings: Secondary | ICD-10-CM | POA: Diagnosis not present

## 2021-08-04 DIAGNOSIS — C3491 Malignant neoplasm of unspecified part of right bronchus or lung: Secondary | ICD-10-CM

## 2021-08-04 DIAGNOSIS — Z7901 Long term (current) use of anticoagulants: Secondary | ICD-10-CM

## 2021-08-04 DIAGNOSIS — E049 Nontoxic goiter, unspecified: Secondary | ICD-10-CM | POA: Insufficient documentation

## 2021-08-04 DIAGNOSIS — I480 Paroxysmal atrial fibrillation: Secondary | ICD-10-CM | POA: Diagnosis not present

## 2021-08-04 DIAGNOSIS — R739 Hyperglycemia, unspecified: Secondary | ICD-10-CM | POA: Insufficient documentation

## 2021-08-04 DIAGNOSIS — Z124 Encounter for screening for malignant neoplasm of cervix: Secondary | ICD-10-CM | POA: Insufficient documentation

## 2021-08-04 DIAGNOSIS — Z1231 Encounter for screening mammogram for malignant neoplasm of breast: Secondary | ICD-10-CM | POA: Diagnosis not present

## 2021-08-04 DIAGNOSIS — I10 Essential (primary) hypertension: Secondary | ICD-10-CM

## 2021-08-04 DIAGNOSIS — M1711 Unilateral primary osteoarthritis, right knee: Secondary | ICD-10-CM

## 2021-08-04 DIAGNOSIS — Z952 Presence of prosthetic heart valve: Secondary | ICD-10-CM

## 2021-08-04 DIAGNOSIS — N3281 Overactive bladder: Secondary | ICD-10-CM

## 2021-08-04 DIAGNOSIS — E785 Hyperlipidemia, unspecified: Secondary | ICD-10-CM

## 2021-08-04 DIAGNOSIS — N898 Other specified noninflammatory disorders of vagina: Secondary | ICD-10-CM | POA: Insufficient documentation

## 2021-08-04 DIAGNOSIS — G6282 Radiation-induced polyneuropathy: Secondary | ICD-10-CM | POA: Diagnosis not present

## 2021-08-04 NOTE — Assessment & Plan Note (Signed)
Continued radiation schedule for R lung cancer Patient doing well with treatment plan excluding neuropathy

## 2021-08-04 NOTE — Assessment & Plan Note (Signed)
Patient request for screening given known CA hx

## 2021-08-04 NOTE — Assessment & Plan Note (Signed)
Recommend A1c screening given previous elevation in serum glucose

## 2021-08-04 NOTE — Assessment & Plan Note (Signed)
Yeast like discharge- screening sent

## 2021-08-04 NOTE — Assessment & Plan Note (Signed)
Normal breast exam; no concerns, mammogram screening

## 2021-08-04 NOTE — Assessment & Plan Note (Signed)
Upcoming MRI; wearing brace today during exam

## 2021-08-04 NOTE — Assessment & Plan Note (Signed)
Improved with use of gabapentin Continue to monitor

## 2021-08-04 NOTE — Assessment & Plan Note (Signed)
Continue AC; no evidence of gross bruising

## 2021-08-04 NOTE — Assessment & Plan Note (Signed)
No obvious cause; refer for urology w/u

## 2021-08-04 NOTE — Assessment & Plan Note (Signed)
Chronic, stable Denies concerns Has f/u with cards in the Spring No LE edema noted

## 2021-08-04 NOTE — Progress Notes (Signed)
Annual Wellness Visit     Patient: Vanessa Carlson, Female    DOB: 20-Mar-1961, 60 y.o.   MRN: [id number] Visit Date: 08/04/2021  Today's Provider: Gwyneth Sprout, FNP   Chief Complaint  Patient presents with   Medicare Wellness   Subjective      HPI  Vanessa Carlson is a 60 y.o. female who presents today for her Annual Wellness Visit. She reports consuming a general diet. Home exercise routine includes walking. She generally feels fairly well. She reports sleeping fairly well. She does have additional problems to discuss today, patient would like to address OA ( overactive bladder).   Last Reported Mammogram-09/12/19 Pap-03/08/2018 Colonoscopy 01/11/20 Medications: Outpatient Medications Prior to Visit  Medication Sig   aspirin 81 MG chewable tablet Chew 81 mg by mouth daily.   atorvastatin (LIPITOR) 80 MG tablet TAKE 1 TABLET BY MOUTH ONCE DAILY. OFFICE VISIT AND LABS NEEDED FOR FURTHER REFILLS.   cyclobenzaprine (FLEXERIL) 5 MG tablet Take 1 tablet (5 mg total) by mouth 3 (three) times daily as needed for muscle spasms.   ENTRESTO 24-26 MG Take 1 tablet by mouth twice daily   ezetimibe (ZETIA) 10 MG tablet Take 1 tablet (10 mg total) by mouth daily.   fluticasone (FLONASE) 50 MCG/ACT nasal spray Place 2 sprays into both nostrils as needed for allergies or rhinitis.   furosemide (LASIX) 40 MG tablet Take 2 tablets by mouth twice daily   gabapentin (NEURONTIN) 300 MG capsule Take 1 capsule (300 mg total) by mouth 2 (two) times daily.   metoprolol succinate (TOPROL-XL) 25 MG 24 hr tablet Take 1 tablet (25 mg total) by mouth in the morning and at bedtime. Take with or immediately following a meal.   nitroGLYCERIN (NITROSTAT) 0.3 MG SL tablet Place 1 tablet (0.3 mg total) under the tongue every 5 (five) minutes as needed for chest pain.   spironolactone (ALDACTONE) 25 MG tablet Take 1 tablet (25 mg total) by mouth daily.   traMADol (ULTRAM) 50 MG tablet TAKE 1 TABLET BY MOUTH  EVERY 6 HOURS AS NEEDED   traZODone (DESYREL) 50 MG tablet Take 0.5-1 tablets (25-50 mg total) by mouth at bedtime as needed for sleep.   warfarin (COUMADIN) 10 MG tablet Take 5 mg by mouth daily. Takes on Saturdays   warfarin (COUMADIN) 7.5 MG tablet Take 1 tablet (7.5 mg total) by mouth daily at 4 PM. Take 1 tablet (7.5 mg total)  by mouth daily except Saturdays   diclofenac Sodium (VOLTAREN) 1 % GEL Apply topically as needed. (Patient not taking: Reported on 08/04/2021)   No facility-administered medications prior to visit.    No Known Allergies  Patient Care Team: Virginia Crews, MD as PCP - General (Family Medicine) End, Harrell Gave, MD as PCP - Cardiology (Cardiology) Nestor Lewandowsky, MD (Inactive) as Referring Physician (Cardiothoracic Surgery) Sindy Guadeloupe, MD as Consulting Physician (Oncology) Sindy Guadeloupe, MD as Consulting Physician (Oncology) Noreene Filbert, MD as Consulting Physician (Radiation Oncology)  Review of Systems      Objective    Vitals: BP 124/68   Pulse 78   Resp 16   Ht 5' 2.5" (1.588 m)   Wt 225 lb 4.8 oz (102.2 kg)   LMP  (LMP Unknown)   SpO2 96%   BMI 40.55 kg/m    Physical Exam Vitals and nursing note reviewed.  Constitutional:      General: She is awake. She is not in acute distress.  Appearance: Normal appearance. She is well-developed and well-groomed. She is obese. She is not ill-appearing, toxic-appearing or diaphoretic.  HENT:     Head: Normocephalic and atraumatic.     Jaw: There is normal jaw occlusion. No trismus, tenderness, swelling or pain on movement.     Right Ear: Hearing, tympanic membrane, ear canal and external ear normal. There is no impacted cerumen.     Left Ear: Hearing, tympanic membrane, ear canal and external ear normal. There is no impacted cerumen.     Nose: Nose normal. No congestion or rhinorrhea.     Right Turbinates: Not enlarged, swollen or pale.     Left Turbinates: Not enlarged, swollen or pale.      Right Sinus: No maxillary sinus tenderness or frontal sinus tenderness.     Left Sinus: No maxillary sinus tenderness or frontal sinus tenderness.     Mouth/Throat:     Lips: Pink.     Mouth: Mucous membranes are moist. No injury.     Tongue: No lesions.     Pharynx: Oropharynx is clear. Uvula midline. No pharyngeal swelling, oropharyngeal exudate, posterior oropharyngeal erythema or uvula swelling.     Tonsils: No tonsillar exudate or tonsillar abscesses.  Eyes:     General: Lids are normal. Lids are everted, no foreign bodies appreciated. Vision grossly intact. Gaze aligned appropriately. No allergic shiner or visual field deficit.       Right eye: No discharge.        Left eye: No discharge.     Extraocular Movements: Extraocular movements intact.     Conjunctiva/sclera: Conjunctivae normal.     Right eye: Right conjunctiva is not injected. No exudate.    Left eye: Left conjunctiva is not injected. No exudate.    Pupils: Pupils are equal, round, and reactive to light.  Neck:     Thyroid: Thyromegaly present. No thyroid mass.     Vascular: No carotid bruit.     Trachea: Trachea normal.     Comments: Check tyroid labs given enlargement  Cardiovascular:     Rate and Rhythm: Normal rate and regular rhythm.     Pulses: Normal pulses.          Carotid pulses are 2+ on the right side and 2+ on the left side.      Radial pulses are 2+ on the right side and 2+ on the left side.       Dorsalis pedis pulses are 2+ on the right side and 2+ on the left side.       Posterior tibial pulses are 2+ on the right side and 2+ on the left side.     Heart sounds: Normal heart sounds, S1 normal and S2 normal. No murmur heard.   No friction rub. No gallop.  Pulmonary:     Effort: Pulmonary effort is normal. No respiratory distress.     Breath sounds: Normal breath sounds and air entry. No stridor. No wheezing, rhonchi or rales.  Chest:     Chest wall: No tenderness.     Comments: Breasts: breasts  appear normal, no suspicious masses, no skin or nipple changes or axillary nodes, right breast normal without mass, skin or nipple changes or axillary nodes, left breast normal without mass, skin or nipple changes or axillary nodes, risk and benefit of breast self-exam was discussed PPM noted in L chest wall; small 3 mm bx site noted on R breast  Abdominal:     General: Abdomen is flat.  Bowel sounds are normal. There is no distension.     Palpations: Abdomen is soft. There is no mass.     Tenderness: There is no abdominal tenderness. There is no right CVA tenderness, left CVA tenderness, guarding or rebound.     Hernia: No hernia is present. There is no hernia in the left inguinal area or right inguinal area.  Genitourinary:    General: Normal vulva.     Exam position: Lithotomy position.     Pubic Area: No rash or pubic lice.      Tanner stage (genital): 5.     Labia:        Right: No rash, tenderness, lesion or injury.        Left: No rash, tenderness, lesion or injury.      Urethra: No prolapse, urethral pain, urethral swelling or urethral lesion.     Vagina: Vaginal discharge present.     Adnexa: Right adnexa normal and left adnexa normal.     Rectum: Normal.     Comments: White, thick, clumping discharge NuSwab+ sample sent per pt request No obvious evidence of reason for OAB- advise referral to urology Musculoskeletal:        General: No swelling, tenderness, deformity or signs of injury. Normal range of motion.     Cervical back: Full passive range of motion without pain, normal range of motion and neck supple. No edema, rigidity or tenderness. No muscular tenderness.     Right lower leg: No edema.     Left lower leg: No edema.  Lymphadenopathy:     Cervical: No cervical adenopathy.     Right cervical: No superficial, deep or posterior cervical adenopathy.    Left cervical: No superficial, deep or posterior cervical adenopathy.     Lower Body: No right inguinal adenopathy. No  left inguinal adenopathy.  Skin:    General: Skin is warm and dry.     Capillary Refill: Capillary refill takes less than 2 seconds.     Coloration: Skin is not jaundiced or pale.     Findings: No bruising, erythema, lesion or rash.  Neurological:     General: No focal deficit present.     Mental Status: She is alert and oriented to person, place, and time. Mental status is at baseline.     GCS: GCS eye subscore is 4. GCS verbal subscore is 5. GCS motor subscore is 6.     Sensory: Sensation is intact. No sensory deficit.     Motor: Motor function is intact. No weakness.     Coordination: Coordination is intact. Coordination normal.     Gait: Gait is intact. Gait normal.  Psychiatric:        Attention and Perception: Attention and perception normal.        Mood and Affect: Mood and affect normal.        Speech: Speech normal.        Behavior: Behavior normal. Behavior is cooperative.        Thought Content: Thought content normal.        Cognition and Memory: Cognition and memory normal.        Judgment: Judgment normal.    Most recent functional status assessment: In your present state of health, do you have any difficulty performing the following activities: 05/28/2021  Hearing? N  Vision? N  Difficulty concentrating or making decisions? N  Walking or climbing stairs? N  Dressing or bathing? N  Doing errands, shopping? N  Some recent data might be hidden   Most recent fall risk assessment: Fall Risk  10/21/2020  Falls in the past year? 0  Number falls in past yr: 0  Injury with Fall? 0  Comment -  Risk for fall due to : -  Follow up -    Most recent depression screenings: PHQ 2/9 Scores 10/21/2020 12/14/2019  PHQ - 2 Score 0 1  PHQ- 9 Score - 1   Most recent cognitive screening: 6CIT Screen 12/14/2019  What Year? 0 points  What month? 0 points  What time? 0 points  Count back from 20 0 points  Months in reverse 0 points  Repeat phrase 2 points  Total Score 2    Most recent Audit-C alcohol use screening Alcohol Use Disorder Test (AUDIT) 10/21/2020  1. How often do you have a drink containing alcohol? 0  2. How many drinks containing alcohol do you have on a typical day when you are drinking? 0  3. How often do you have six or more drinks on one occasion? 0  AUDIT-C Score 0  Alcohol Brief Interventions/Follow-up AUDIT Score <7 follow-up not indicated   A score of 3 or more in women, and 4 or more in men indicates increased risk for alcohol abuse, EXCEPT if all of the points are from question 1   No results found for any visits on 08/04/21.  Assessment & Plan     Annual wellness visit done today including the all of the following: Reviewed patient's Family Medical History Reviewed and updated list of patient's medical providers Assessment of cognitive impairment was done Assessed patient's functional ability Established a written schedule for health screening [place] Completed and Reviewed  Exercise Activities and Dietary recommendations  Goals      Exercise 3x per week (30 min per time)     Recommend to exercise for 3 days a week for at least 30 minutes at a time.         Immunization History  Administered Date(s) Administered   Influenza Split 05/17/2016   Influenza,inj,Quad PF,6+ Mos 06/20/2015, 07/08/2017, 09/08/2018, 05/29/2021   PFIZER(Purple Top)SARS-COV-2 Vaccination 06/05/2020, 06/28/2020   Pneumococcal Polysaccharide-23 12/03/2008    Health Maintenance  Topic Date Due   Zoster Vaccines- Shingrix (1 of 2) Never done   Pneumococcal Vaccine 23-55 Years old (2 - PCV) 12/03/2009   COVID-19 Vaccine (3 - Pfizer risk series) 07/26/2020   PAP SMEAR-Modifier  03/08/2021   TETANUS/TDAP  09/09/2023 (Originally 12/30/1978)   MAMMOGRAM  09/11/2021   COLONOSCOPY (Pts 45-14yrs Insurance coverage will need to be confirmed)  01/10/2030   INFLUENZA VACCINE  Completed   Hepatitis C Screening  Completed   HIV  Screening  Completed   HPV VACCINES  Aged Out     Discussed health benefits of physical activity, and encouraged her to engage in regular exercise appropriate for her age and condition.    Problem List Items Addressed This Visit       Cardiovascular and Mediastinum   HTN (hypertension) (Chronic)    Chronic, stable Continue to monitor need for diuresis in light of HFrEF      Chronic HFrEF (heart failure with reduced ejection fraction) (HCC)    Chronic, stable Denies concerns Has f/u with cards in the Spring No LE edema noted      Paroxysmal atrial fibrillation ([place])    SR on exam; PPM with AICD function in place Patient unaware of battery change need or lead exchange date  Has had not problems with device since placement Continue AC regimen at this time        Respiratory   Lung cancer ([place])    Continued radiation schedule for R lung cancer Patient doing well with treatment plan excluding neuropathy        Endocrine   Enlarged thyroid    Check TSH and free T 4 given enlargement found on exam      Relevant Orders   TSH + free T4     Nervous and Auditory   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[facility])    Improved with use of gabapentin Continue to monitor         Musculoskeletal and Integument   OA (osteoarthritis) of knee    Upcoming MRI; wearing brace today during exam        Genitourinary   Overactive bladder    No obvious cause; refer for urology w/u      Relevant Orders   Ambulatory referral to Urology     Other   H/O mitral valve replacement with mechanical valve    Continue AC; no evidence of gross bruising       Chronic anticoagulation    Continue monitor and adjust as needed      Morbid obesity ([place])    BMI 40.55 -htn, heart failure -hld Discussed importance of healthy weight management Discussed diet and exercise       Hyperlipidemia LDL goal <70    Recommend increase in diet of healthier fat choices- low fat meats, oils that are not  solid at room temperature, nuts, seeds, fish- cod, halibut, salmon, and avocado. Exercise can also increase this number.  Supplemental omega 3's can be taken as well but are not as helpful as dietary/exercise changes.       Vaginal discharge    Yeast like discharge- screening sent      Relevant Orders   NuSwab Vaginitis Plus (VG+)   Screening mammogram for breast cancer    Normal breast exam; no concerns, mammogram screening      Relevant Orders   MM 3D SCREEN BREAST BILATERAL   Screening for cervical cancer    Patient request for screening given known CA hx      Relevant Orders   Cytology - PAP   Elevated serum glucose    Recommend A1c screening given previous elevation in serum glucose      Relevant Orders   Hemoglobin A1c   Medicare annual wellness visit, subsequent - Primary    Due for dental and eye appts Things to do to keep yourself healthy  - Exercise at least 30-45 minutes a day, 3-4 days a week.  - Eat a low-fat diet with lots of fruits and vegetables, up to 7-9 servings per day.  - Seatbelts can save your life. Wear them always.  - Smoke detectors on every level of your home, check batteries every year.  - Eye Doctor - have an eye exam every 1-2 years  - Safe sex - if you may be exposed to STDs, use a condom.  - Alcohol -  If you drink, do it moderately, less than 2 drinks per day.  - [place]. Choose someone to speak for you if you are not able.  - Depression is common in our stressful world.If you're feeling down or losing interest in things you normally enjoy, please come in for a visit.  - Violence - If anyone is threatening or hurting you, please  call immediately.          Return in about 1 year (around 08/04/2022).     Vonna Kotyk, FNP, have reviewed all documentation for this visit. The documentation on 08/04/21 for the exam, diagnosis, procedures, and orders are all accurate and complete.    Gwyneth Sprout, [place] [phone] (phone) [phone] (fax)  [place]

## 2021-08-04 NOTE — Assessment & Plan Note (Signed)
BMI 40.55 -htn, heart failure -hld Discussed importance of healthy weight management Discussed diet and exercise

## 2021-08-04 NOTE — Assessment & Plan Note (Signed)
Continue monitor and adjust as needed

## 2021-08-04 NOTE — Assessment & Plan Note (Signed)
Recommend increase in diet of healthier fat choices- low fat meats, oils that are not solid at room temperature, nuts, seeds, fish- cod, halibut, salmon, and avocado. Exercise can also increase this number.  Supplemental omega 3's can be taken as well but are not as helpful as dietary/exercise changes.

## 2021-08-04 NOTE — Assessment & Plan Note (Signed)
Chronic, stable Continue to monitor need for diuresis in light of HFrEF

## 2021-08-04 NOTE — Assessment & Plan Note (Signed)
Due for dental and eye appts Things to do to keep yourself healthy  - Exercise at least 30-45 minutes a day, 3-4 days a week.  - Eat a low-fat diet with lots of fruits and vegetables, up to 7-9 servings per day.  - Seatbelts can save your life. Wear them always.  - Smoke detectors on every level of your home, check batteries every year.  - Eye Doctor - have an eye exam every 1-2 years  - Safe sex - if you may be exposed to STDs, use a condom.  - Alcohol -  If you drink, do it moderately, less than 2 drinks per day.  - [place]. Choose someone to speak for you if you are not able.  - Depression is common in our stressful world.If you're feeling down or losing interest in things you normally enjoy, please come in for a visit.  - Violence - If anyone is threatening or hurting you, please call immediately.

## 2021-08-04 NOTE — Assessment & Plan Note (Signed)
SR on exam; PPM with AICD function in place Patient unaware of battery change need or lead exchange date Has had not problems with device since placement Continue AC regimen at this time

## 2021-08-04 NOTE — Assessment & Plan Note (Signed)
Check TSH and free T 4 given enlargement found on exam

## 2021-08-05 ENCOUNTER — Encounter: Payer: Medicare HMO | Admitting: Family Medicine

## 2021-08-06 ENCOUNTER — Other Ambulatory Visit: Payer: Self-pay | Admitting: Family Medicine

## 2021-08-06 DIAGNOSIS — B9689 Other specified bacterial agents as the cause of diseases classified elsewhere: Secondary | ICD-10-CM

## 2021-08-06 DIAGNOSIS — N760 Acute vaginitis: Secondary | ICD-10-CM

## 2021-08-06 LAB — NUSWAB VAGINITIS PLUS (VG+)
Atopobium vaginae: HIGH Score — AB
Candida albicans, NAA: NEGATIVE
Candida glabrata, NAA: NEGATIVE
Chlamydia trachomatis, NAA: NEGATIVE
Neisseria gonorrhoeae, NAA: NEGATIVE
Trich vag by NAA: NEGATIVE

## 2021-08-06 LAB — SPECIMEN STATUS REPORT

## 2021-08-06 MED ORDER — METRONIDAZOLE 500 MG PO TABS: 500.0000 mg | ORAL_TABLET | Freq: Two times a day (BID) | ORAL | 0 refills | Status: AC

## 2021-08-10 LAB — CYTOLOGY - PAP
Adequacy: ABSENT
Diagnosis: NEGATIVE

## 2021-08-11 ENCOUNTER — Other Ambulatory Visit: Payer: Self-pay | Admitting: Family Medicine

## 2021-08-11 DIAGNOSIS — M545 Low back pain, unspecified: Secondary | ICD-10-CM

## 2021-08-11 NOTE — Telephone Encounter (Signed)
Requested medication (s) are due for refill today: yes  Requested medication (s) are on the active medication list: yes    Last refill: 07/14/21 #30  0 refills  Future visit scheduled no  Notes to clinic:Not delegated  Requested Prescriptions  Pending Prescriptions Disp Refills   traMADol (ULTRAM) 50 MG tablet [Pharmacy Med Name: traMADol HCl 50 MG Oral Tablet] 30 tablet 0    Sig: TAKE 1 TABLET BY MOUTH EVERY 6 HOURS AS NEEDED     Not Delegated - Analgesics:  Opioid Agonists Failed - 08/11/2021  4:59 PM      Failed - This refill cannot be delegated      Failed - Urine Drug Screen completed in last 360 days      Passed - Valid encounter within last 6 months    Recent Outpatient Visits           1 week ago Medicare annual wellness visit, subsequent   [facility] Tally Joe T, FNP   7 months ago Status post mitral valve replacement   [facility] [place], Dionne Bucy, MD   1 year ago Allergic conjunctivitis of right eye   [facility] [place], Dionne Bucy, MD   1 year ago Encounter for annual wellness visit (AWV) in Medicare patient   [facility] [place], Dionne Bucy, MD   1 year ago Muscle spasm   [facility] [place], Clearnce Sorrel, [place]       Future Appointments             In 1 month MacDiarmid, Nicki Reaper, MD [facility]            Signed Prescriptions Disp Refills   spironolactone (ALDACTONE) 25 MG tablet 90 tablet 0    Sig: TAKE 1 TABLET BY MOUTH ONCE DAILY. APPOINTMENT NEEDED FOR FURTHER REFILLS.     Cardiovascular: Diuretics - Aldosterone Antagonist Passed - 08/11/2021  4:59 PM      Passed - Cr in normal range and within 360 days    Creat  Date Value Ref Range Status  07/08/2017 1.14 (H) 0.50 - 1.05 mg/dL Final    Comment:    For patients >72 years of age, the reference limit for Creatinine is approximately 13% higher for people identified as  African-American. .    Creatinine, Ser  Date Value Ref Range Status  05/29/2021 0.98 0.44 - 1.00 mg/dL Final          Passed - K in normal range and within 360 days    Potassium  Date Value Ref Range Status  05/29/2021 3.6 3.5 - 5.1 mmol/L Final          Passed - Na in normal range and within 360 days    Sodium  Date Value Ref Range Status  05/29/2021 137 135 - 145 mmol/L Final  12/19/2019 145 (H) 134 - 144 mmol/L Final          Passed - Last BP in normal range    BP Readings from Last 1 Encounters:  08/04/21 124/68          Passed - Valid encounter within last 6 months    Recent Outpatient Visits           1 week ago Medicare annual wellness visit, subsequent   [facility] Tally Joe T, FNP   7 months ago Status post mitral valve replacement   [facility] [place], Dionne Bucy, MD   1 year ago  Allergic conjunctivitis of right eye   [facility] [place], Dionne Bucy, MD   1 year ago Encounter for annual wellness visit (AWV) in Medicare patient   [place], MD   1 year ago Muscle spasm   [place], Clearnce Sorrel, [place]       Future Appointments             In 87 month [place], Nicki Reaper, [facility]

## 2021-08-18 ENCOUNTER — Other Ambulatory Visit: Payer: Self-pay

## 2021-08-18 ENCOUNTER — Ambulatory Visit (HOSPITAL_COMMUNITY)
Admission: RE | Admit: 2021-08-18 | Discharge: 2021-08-18 | Disposition: A | Discharge status: Home / Self Care | Payer: Medicare HMO | Source: Ambulatory Visit | Attending: Physician Assistant | Admitting: Physician Assistant

## 2021-08-18 DIAGNOSIS — M25561 Pain in right knee: Secondary | ICD-10-CM

## 2021-08-18 NOTE — Progress Notes (Signed)
Per order, Changed device settings for MRI to  OVO  MRI mode/Tachy-therapies to off  Will program device back to pre-MRI settings after completion of exam, and send transmission

## 2021-08-19 ENCOUNTER — Ambulatory Visit (INDEPENDENT_AMBULATORY_CARE_PROVIDER_SITE_OTHER): Payer: Medicare HMO

## 2021-08-19 DIAGNOSIS — I480 Paroxysmal atrial fibrillation: Secondary | ICD-10-CM

## 2021-08-19 DIAGNOSIS — Z952 Presence of prosthetic heart valve: Secondary | ICD-10-CM

## 2021-08-19 LAB — POCT INR
INR: 4.9 — AB (ref 2.0–3.0)
PT: 58.4

## 2021-08-19 NOTE — Patient Instructions (Signed)
Description   HOLD TODAY, Thursday 7.5 mg, THEN Friday AND Saturday 5 mg. THEN GO BACK TO REGULAR SCHEDULE. F/U 2 WK

## 2021-08-22 ENCOUNTER — Other Ambulatory Visit: Payer: Self-pay | Admitting: Internal Medicine

## 2021-08-25 ENCOUNTER — Other Ambulatory Visit: Payer: Self-pay | Admitting: Internal Medicine

## 2021-08-28 ENCOUNTER — Encounter: Payer: Self-pay | Admitting: Family Medicine

## 2021-08-28 DIAGNOSIS — M1711 Unilateral primary osteoarthritis, right knee: Secondary | ICD-10-CM | POA: Diagnosis not present

## 2021-09-02 ENCOUNTER — Ambulatory Visit: Payer: Medicare HMO

## 2021-09-05 ENCOUNTER — Other Ambulatory Visit: Payer: Self-pay | Admitting: Internal Medicine

## 2021-09-07 ENCOUNTER — Ambulatory Visit (INDEPENDENT_AMBULATORY_CARE_PROVIDER_SITE_OTHER): Payer: Medicare HMO

## 2021-09-07 DIAGNOSIS — Z95810 Presence of automatic (implantable) cardiac defibrillator: Secondary | ICD-10-CM

## 2021-09-07 DIAGNOSIS — I5022 Chronic systolic (congestive) heart failure: Secondary | ICD-10-CM

## 2021-09-09 ENCOUNTER — Other Ambulatory Visit: Payer: Self-pay

## 2021-09-09 ENCOUNTER — Ambulatory Visit (INDEPENDENT_AMBULATORY_CARE_PROVIDER_SITE_OTHER): Payer: Medicare HMO

## 2021-09-09 ENCOUNTER — Other Ambulatory Visit: Payer: Self-pay | Admitting: Internal Medicine

## 2021-09-09 DIAGNOSIS — Z952 Presence of prosthetic heart valve: Secondary | ICD-10-CM

## 2021-09-09 DIAGNOSIS — I480 Paroxysmal atrial fibrillation: Secondary | ICD-10-CM | POA: Diagnosis not present

## 2021-09-09 LAB — POCT INR
INR: 2.3 (ref 2.0–3.0)
PT: 27.8

## 2021-09-09 NOTE — Patient Instructions (Signed)
Description   7.5 mg daily except Saturday 5 mg. F/u in 3 weeks.

## 2021-09-09 NOTE — Progress Notes (Signed)
EPIC Encounter for ICM Monitoring  Patient Name: Vanessa Carlson is a 61 y.o. female Date: 09/09/2021 Primary Care Physican: Virginia Crews, MD Primary Cardiologist: Carlis Abbott, NP HF Electrophysiologist: Caryl Comes 08/04/2021 Office Weight: 225 lbs        Transmission reviewed.    Optivol thoracic impedance suggesting normal fluid levels.    Prescribed:  Furosemide 40 mg 2 tablets (80 mg total) twice a day.   Spironolactone 25 mg take 1 tablet daily   Labs: 05/29/2021 Creatinine 0.98, BUN 11, Potassium 3.6, Sodium 137, GFR >60 05/28/2021 Creatinine 1.11, BUN 15, Potassium 3.5, Sodium 142, GFR 57 05/27/2021 Creatinine 1.39, BUN 18, Potassium 3.0, Sodium 138, GFR 43 02/20/2021 Creatinine 0.98, BUN 13, Potassium 4.4, Sodium 138, GFR >60 09/30/2020 Creatinine 0.97, BUN 13, Potassium 4.5, Sodium 140, GFR >60 A complete set of results can be found in Results Review.   Recommendations: No changes.   Follow-up plan: ICM clinic phone appointment on 10/12/2021.   91 day device clinic remote transmission 10/06/2021.       EP/Cardiology Office Visits:  Recall 01/07/2021 with Dr. Caryl Comes.  Recall 12/06/2021 with Dr End.   Copy of ICM check sent to Dr. Caryl Comes.      3 month ICM trend: 09/07/2021.    12-14 Month ICM trend:     [name], RN 09/09/2021 12:36 PM

## 2021-09-12 ENCOUNTER — Encounter: Payer: Self-pay | Admitting: Internal Medicine

## 2021-09-12 ENCOUNTER — Other Ambulatory Visit: Payer: Self-pay | Admitting: Family Medicine

## 2021-09-12 ENCOUNTER — Encounter: Payer: Self-pay | Admitting: Family Medicine

## 2021-09-12 ENCOUNTER — Other Ambulatory Visit: Payer: Self-pay | Admitting: Internal Medicine

## 2021-09-12 DIAGNOSIS — M545 Low back pain, unspecified: Secondary | ICD-10-CM

## 2021-09-13 NOTE — Telephone Encounter (Signed)
Requested medication (s) are due for refill today: yes  Requested medication (s) are on the active medication list: yes  Last refill:  08/12/21 #30  Future visit scheduled: no  Notes to clinic:  med not delegated to NT to RF   Requested Prescriptions  Pending Prescriptions Disp Refills   traMADol (ULTRAM) 50 MG tablet [Pharmacy Med Name: traMADol HCl 50 MG Oral Tablet] 30 tablet 0    Sig: TAKE 1 TABLET BY MOUTH EVERY 6 HOURS AS NEEDED     Not Delegated - Analgesics:  Opioid Agonists Failed - 09/12/2021  5:53 PM      Failed - This refill cannot be delegated      Failed - Urine Drug Screen completed in last 360 days      Passed - Valid encounter within last 6 months    Recent Outpatient Visits           1 month ago Medicare annual wellness visit, subsequent   [facility] Tally Joe T, FNP   8 months ago Status post mitral valve replacement   [facility] [place], Dionne Bucy, MD   1 year ago Allergic conjunctivitis of right eye   [facility] [place], Dionne Bucy, MD   1 year ago Encounter for annual wellness visit (AWV) in Medicare patient   [facility] [place], Dionne Bucy, MD   1 year ago Muscle spasm   [place], Clearnce Sorrel, [place]       Future Appointments             In 1 week Bjorn Loser, [place]

## 2021-09-14 ENCOUNTER — Encounter: Payer: Self-pay | Admitting: Family Medicine

## 2021-09-15 DIAGNOSIS — M1711 Unilateral primary osteoarthritis, right knee: Secondary | ICD-10-CM | POA: Diagnosis not present

## 2021-09-15 DIAGNOSIS — M179 Osteoarthritis of knee, unspecified: Secondary | ICD-10-CM | POA: Insufficient documentation

## 2021-09-21 ENCOUNTER — Ambulatory Visit: Payer: Medicare HMO | Admitting: Urology

## 2021-09-21 ENCOUNTER — Encounter: Payer: Self-pay | Admitting: Urology

## 2021-09-21 ENCOUNTER — Other Ambulatory Visit: Payer: Self-pay

## 2021-09-21 VITALS — BP 118/75 | HR 76 | Ht 62.5 in | Wt 225.0 lb

## 2021-09-21 DIAGNOSIS — N3946 Mixed incontinence: Secondary | ICD-10-CM | POA: Diagnosis not present

## 2021-09-21 DIAGNOSIS — N3281 Overactive bladder: Secondary | ICD-10-CM | POA: Diagnosis not present

## 2021-09-21 LAB — URINALYSIS, COMPLETE
Bilirubin, UA: NEGATIVE
Glucose, UA: NEGATIVE
Ketones, UA: NEGATIVE
Leukocytes,UA: NEGATIVE
Nitrite, UA: NEGATIVE
Protein,UA: NEGATIVE
RBC, UA: NEGATIVE
Specific Gravity, UA: 1.015 (ref 1.005–1.030)
Urobilinogen, Ur: 2 mg/dL — ABNORMAL HIGH (ref 0.2–1.0)
pH, UA: 7 (ref 5.0–7.5)

## 2021-09-21 LAB — MICROSCOPIC EXAMINATION: Bacteria, UA: NONE SEEN

## 2021-09-21 LAB — BLADDER SCAN AMB NON-IMAGING: Scan Result: 0

## 2021-09-21 MED ORDER — MIRABEGRON ER 50 MG PO TB24: 50.0000 mg | ORAL_TABLET | Freq: Every day | ORAL | 11 refills | Status: DC

## 2021-09-21 NOTE — Patient Instructions (Signed)

## 2021-09-21 NOTE — Progress Notes (Signed)
09/21/2021 2:24 PM   Vanessa Carlson June 01, 1961 [id number]  Referring provider: Gwyneth Sprout, [address] [place],  [place] [address]  Chief Complaint  Patient presents with   Over Active Bladder    HPI: I was consulted to assess the patient's urinary incontinence.  She leaks with coughing sneezing bending lifting.  She is urge incontinence.  Both are significant.  She wears 1 or 2 pads a day that are moderately wet.  No bedwetting.  She voids every 1 or 2 hours gets up twice at night with a good flow  She has had a hysterectomy.  No history of kidney stones bladder surgery bladder infections.  No neurologic issues.  No treatment.  Bowel function normal   PMH: Past Medical History:  Diagnosis Date   AICD (automatic cardioverter/defibrillator) present    Anterior myocardial infarction ([place])    a. 10/2014 - occluded LAD, complicated by cardiogenic shock-->Med Rx as interventional team was unable to open LAD.   Cancer of upper lobe of right lung ([place]) 09/2017   radiation therapy right side   Cervical cancer ([place])    in remission for ~20 yrs, s/p hysterectomy   Chronic combined systolic and diastolic CHF (congestive heart failure) ([place])    a. 10/2015 Echo: EF 20-25%, sev diast dysfxn, mildly reduced RV fxn. Nl MV prosthesis; b. 12/2017 Echo: EF 20-25%, diff HK. Sev ant/antsept HK, apical AK. Mild MS (mean graad 2mmHg). Mod TR. PASP 65mmHg.   Coronary artery disease    a. 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   Essential hypertension    Ischemic cardiomyopathy    a. 03/2015 s/p MDT single lead AICD;  b. 10/2015 Echo: EF 20-25%;  c. 12/2017 Echo: EF 20-25%, diff HK.   Mass of upper lobe of right lung    a. noted on CXR & CT 08/2017 w/ abnl PET CT.   Mitral valve disease    a. 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 12/2017 Echo: Mild MS. Mean  gradient 48mmHg.   Personal history of radiation therapy    f/u lung ca    Surgical History: Past Surgical History:  Procedure Laterality Date   APPENDECTOMY  1998   BREAST EXCISIONAL BIOPSY Left 80s   benign   CARDIAC CATHETERIZATION     CARDIAC DEFIBRILLATOR PLACEMENT     COLONOSCOPY WITH PROPOFOL N/A 01/11/2020   Procedure: COLONOSCOPY WITH PROPOFOL;  Surgeon: Jonathon Bellows, MD;  Location: [facility] ENDOSCOPY;  Service: Gastroenterology;  Laterality: N/A;   RADICAL HYSTERECTOMY  1998   SHOULDER SURGERY Bilateral    rotator cuff tears   VALVE REPLACEMENT     Mitral valve; 27 mm St. Jude bileaflet valve    Home Medications:  Allergies as of 09/21/2021   No Known Allergies      Medication List        Accurate as of September 21, 2021  2:24 PM. If you have any questions, ask your nurse or doctor.          STOP taking these medications    diclofenac Sodium 1 % Gel Commonly known as: VOLTAREN Stopped by: Reece Packer, MD       TAKE these medications    aspirin 81 MG chewable tablet Chew 81 mg by mouth daily.   atorvastatin 80 MG tablet Commonly known as: LIPITOR TAKE 1 TABLET BY MOUTH ONCE DAILY. OFFICE VISIT  AND LABS NEEDED FOR FURTHER REFILLS.   cyclobenzaprine 5 MG tablet Commonly known as: FLEXERIL Take 1 tablet (5 mg total) by mouth 3 (three) times daily as needed for muscle spasms.   Entresto 24-26 MG Generic drug: sacubitril-valsartan Take 1 tablet by mouth twice daily   ezetimibe 10 MG tablet Commonly known as: ZETIA Take 1 tablet (10 mg total) by mouth daily.   fluticasone 50 MCG/ACT nasal spray Commonly known as: FLONASE Place 2 sprays into both nostrils as needed for allergies or rhinitis.   furosemide 40 MG tablet Commonly known as: LASIX Take 2 tablets by mouth twice daily   gabapentin 300 MG capsule Commonly known as: NEURONTIN Take 1 capsule (300 mg total) by mouth 2 (two) times daily.   metoprolol succinate 25 MG 24 hr  tablet Commonly known as: TOPROL-XL Take 1 tablet (25 mg total) by mouth in the morning and at bedtime. What changed: Another medication with the same name was removed. Continue taking this medication, and follow the directions you see here. Changed by: Reece Packer, MD   nitroGLYCERIN 0.3 MG SL tablet Commonly known as: NITROSTAT Place 1 tablet (0.3 mg total) under the tongue every 5 (five) minutes as needed for chest pain.   spironolactone 25 MG tablet Commonly known as: ALDACTONE TAKE 1 TABLET BY MOUTH ONCE DAILY. APPOINTMENT NEEDED FOR FURTHER REFILLS.   traMADol 50 MG tablet Commonly known as: ULTRAM TAKE 1 TABLET BY MOUTH EVERY 6 HOURS AS NEEDED   traZODone 50 MG tablet Commonly known as: DESYREL Take 0.5-1 tablets (25-50 mg total) by mouth at bedtime as needed for sleep.   warfarin 10 MG tablet Commonly known as: COUMADIN Take as directed by the anticoagulation clinic. If you are unsure how to take this medication, talk to your nurse or doctor. Original instructions: Take 5 mg by mouth daily. Takes on Saturdays   warfarin 7.5 MG tablet Commonly known as: COUMADIN Take as directed by the anticoagulation clinic. If you are unsure how to take this medication, talk to your nurse or doctor. Original instructions: Take 1 tablet (7.5 mg total) by mouth daily at 4 PM. Take 1 tablet (7.5 mg total)  by mouth daily except Saturdays        Allergies: No Known Allergies  Family History: Family History  Problem Relation Age of Onset   Heart attack Mother    Hypertension Mother    Diabetes Mother    Emphysema Father        smoker   Diabetes Maternal Grandmother    Kidney disease Maternal Grandmother    Breast cancer Maternal Aunt    Breast cancer Maternal Aunt    Colon cancer Maternal Uncle     Social History:  reports that she quit smoking about 25 years ago. Her smoking use included cigarettes. She has a 4.00 pack-year smoking history. She has never used smokeless  tobacco. She reports that she does not drink alcohol and does not use drugs.  ROS:                                        Physical Exam: LMP  (LMP Unknown)   Constitutional:  Alert and oriented, No acute distress. HEENT: [place] AT, moist mucus membranes.  Trachea midline, no masses. Cardiovascular: No clubbing, cyanosis, or edema. Respiratory: Normal respiratory effort, no increased work of breathing. GI: Abdomen is soft, nontender, nondistended,  no abdominal masses GU: Mild narrowing of introitus.  Mild grade 2 hypermobility the bladder neck.  Mild positive cough test.  No prolapse Skin: No rashes, bruises or suspicious lesions. Lymph: No cervical or inguinal adenopathy. Neurologic: Grossly intact, no focal deficits, moving all 4 extremities. Psychiatric: Normal mood and affect.  Laboratory Data: Lab Results  Component Value Date   WBC 7.7 05/30/2021   HGB 10.8 (L) 05/30/2021   HCT 33.0 (L) 05/30/2021   MCV 88.7 05/30/2021   PLT 110 (L) 05/30/2021    Lab Results  Component Value Date   CREATININE 0.98 05/29/2021    No results found for: PSA  No results found for: TESTOSTERONE  Lab Results  Component Value Date   HGBA1C 5.9 (H) 12/14/2019    Urinalysis    Component Value Date/Time   COLORURINE YELLOW (A) 05/27/2021 0005   APPEARANCEUR CLOUDY (A) 05/27/2021 0005   LABSPEC 1.014 05/27/2021 0005   PHURINE 5.0 05/27/2021 0005   GLUCOSEU NEGATIVE 05/27/2021 0005   HGBUR MODERATE (A) 05/27/2021 0005   BILIRUBINUR NEGATIVE 05/27/2021 0005   KETONESUR NEGATIVE 05/27/2021 0005   PROTEINUR 30 (A) 05/27/2021 0005   NITRITE POSITIVE (A) 05/27/2021 0005   LEUKOCYTESUR MODERATE (A) 05/27/2021 0005    Pertinent Imaging: Urine reviewed.  Chart reviewed.  Urine sent for culture.  Assessment & Plan: Patient has mild mixed incontinence.  She has frequency and nocturia.  Reassess in 6 weeks on Myrbetriq 50 mg samples and prescription for cystoscopy.  May  need urodynamics in the future.  She understood the concept of the test but I did not go over detail  1. Overactive bladder  - Urinalysis, Complete - CULTURE, URINE COMPREHENSIVE   No follow-ups on file.  Reece Packer, MD  [facility] [address], [place], [place] [address] [phone]

## 2021-09-24 LAB — CULTURE, URINE COMPREHENSIVE

## 2021-09-28 ENCOUNTER — Telehealth: Payer: Self-pay | Admitting: *Deleted

## 2021-09-28 DIAGNOSIS — N3946 Mixed incontinence: Secondary | ICD-10-CM

## 2021-09-28 MED ORDER — CIPROFLOXACIN HCL 250 MG PO TABS: 250.0000 mg | ORAL_TABLET | Freq: Two times a day (BID) | ORAL | 0 refills | Status: DC

## 2021-09-28 NOTE — Telephone Encounter (Addendum)
Left VM with details, sent in RX   ----- Message from Bjorn Loser, MD sent at 09/25/2021 11:13 AM EST ----- Ciprofloxacin 250 mg twice a day for 7 days; make sure I have not already treated this   ----- Message ----- From: Shanon Ace, CMA Sent: 09/24/2021   8:09 AM EST To: Bjorn Loser, MD   ----- Message ----- From: Interface, [facility] Lab Results In Sent: 09/21/2021   4:36 PM EST To: Rowe [facility]

## 2021-09-30 ENCOUNTER — Other Ambulatory Visit: Payer: Self-pay

## 2021-09-30 ENCOUNTER — Ambulatory Visit (INDEPENDENT_AMBULATORY_CARE_PROVIDER_SITE_OTHER): Payer: Medicare HMO

## 2021-09-30 DIAGNOSIS — Z952 Presence of prosthetic heart valve: Secondary | ICD-10-CM

## 2021-09-30 DIAGNOSIS — I480 Paroxysmal atrial fibrillation: Secondary | ICD-10-CM

## 2021-09-30 LAB — POCT INR
INR: 3 (ref 2.0–3.0)
PT: 36.4

## 2021-09-30 NOTE — Patient Instructions (Signed)
Description   7.5 mg daily except Saturday 5 mg. F/u in 4 weeks.

## 2021-10-02 ENCOUNTER — Ambulatory Visit
Admission: RE | Admit: 2021-10-02 | Discharge: 2021-10-02 | Disposition: A | Discharge status: Home / Self Care | Payer: Medicare HMO | Source: Ambulatory Visit | Attending: Oncology | Admitting: Oncology

## 2021-10-02 ENCOUNTER — Other Ambulatory Visit: Payer: Self-pay

## 2021-10-02 ENCOUNTER — Inpatient Hospital Stay: Payer: Medicare HMO | Attending: Oncology

## 2021-10-02 DIAGNOSIS — Z87891 Personal history of nicotine dependence: Secondary | ICD-10-CM | POA: Diagnosis not present

## 2021-10-02 DIAGNOSIS — Z79899 Other long term (current) drug therapy: Secondary | ICD-10-CM | POA: Diagnosis not present

## 2021-10-02 DIAGNOSIS — I5042 Chronic combined systolic (congestive) and diastolic (congestive) heart failure: Secondary | ICD-10-CM | POA: Diagnosis not present

## 2021-10-02 DIAGNOSIS — Z7901 Long term (current) use of anticoagulants: Secondary | ICD-10-CM | POA: Insufficient documentation

## 2021-10-02 DIAGNOSIS — Z952 Presence of prosthetic heart valve: Secondary | ICD-10-CM | POA: Diagnosis not present

## 2021-10-02 DIAGNOSIS — C3411 Malignant neoplasm of upper lobe, right bronchus or lung: Secondary | ICD-10-CM | POA: Diagnosis not present

## 2021-10-02 DIAGNOSIS — I110 Hypertensive heart disease with heart failure: Secondary | ICD-10-CM | POA: Insufficient documentation

## 2021-10-02 DIAGNOSIS — Z8541 Personal history of malignant neoplasm of cervix uteri: Secondary | ICD-10-CM | POA: Insufficient documentation

## 2021-10-02 DIAGNOSIS — R590 Localized enlarged lymph nodes: Secondary | ICD-10-CM | POA: Diagnosis not present

## 2021-10-02 DIAGNOSIS — Z95810 Presence of automatic (implantable) cardiac defibrillator: Secondary | ICD-10-CM | POA: Insufficient documentation

## 2021-10-02 DIAGNOSIS — I255 Ischemic cardiomyopathy: Secondary | ICD-10-CM | POA: Diagnosis not present

## 2021-10-02 DIAGNOSIS — I252 Old myocardial infarction: Secondary | ICD-10-CM | POA: Diagnosis not present

## 2021-10-02 DIAGNOSIS — Z923 Personal history of irradiation: Secondary | ICD-10-CM | POA: Diagnosis not present

## 2021-10-02 DIAGNOSIS — I059 Rheumatic mitral valve disease, unspecified: Secondary | ICD-10-CM | POA: Insufficient documentation

## 2021-10-02 DIAGNOSIS — I2510 Atherosclerotic heart disease of native coronary artery without angina pectoris: Secondary | ICD-10-CM | POA: Diagnosis not present

## 2021-10-02 DIAGNOSIS — Z85118 Personal history of other malignant neoplasm of bronchus and lung: Secondary | ICD-10-CM | POA: Insufficient documentation

## 2021-10-02 DIAGNOSIS — C3490 Malignant neoplasm of unspecified part of unspecified bronchus or lung: Secondary | ICD-10-CM | POA: Diagnosis not present

## 2021-10-02 DIAGNOSIS — Z7982 Long term (current) use of aspirin: Secondary | ICD-10-CM | POA: Diagnosis not present

## 2021-10-02 DIAGNOSIS — I700 Atherosclerosis of aorta: Secondary | ICD-10-CM | POA: Diagnosis not present

## 2021-10-02 LAB — CBC WITH DIFFERENTIAL/PLATELET
Abs Immature Granulocytes: 0.02 10*3/uL (ref 0.00–0.07)
Basophils Absolute: 0 10*3/uL (ref 0.0–0.1)
Basophils Relative: 1 %
Eosinophils Absolute: 0.1 10*3/uL (ref 0.0–0.5)
Eosinophils Relative: 2 %
HCT: 38.9 % (ref 36.0–46.0)
Hemoglobin: 12.7 g/dL (ref 12.0–15.0)
Immature Granulocytes: 0 %
Lymphocytes Relative: 28 %
Lymphs Abs: 1.5 10*3/uL (ref 0.7–4.0)
MCH: 29 pg (ref 26.0–34.0)
MCHC: 32.6 g/dL (ref 30.0–36.0)
MCV: 88.8 fL (ref 80.0–100.0)
Monocytes Absolute: 0.5 10*3/uL (ref 0.1–1.0)
Monocytes Relative: 9 %
Neutro Abs: 3.3 10*3/uL (ref 1.7–7.7)
Neutrophils Relative %: 60 %
Platelets: 186 10*3/uL (ref 150–400)
RBC: 4.38 MIL/uL (ref 3.87–5.11)
RDW: 15 % (ref 11.5–15.5)
WBC: 5.5 10*3/uL (ref 4.0–10.5)
nRBC: 0 % (ref 0.0–0.2)

## 2021-10-02 LAB — COMPREHENSIVE METABOLIC PANEL
ALT: 18 U/L (ref 0–44)
AST: 24 U/L (ref 15–41)
Albumin: 4.3 g/dL (ref 3.5–5.0)
Alkaline Phosphatase: 70 U/L (ref 38–126)
Anion gap: 7 (ref 5–15)
BUN: 14 mg/dL (ref 8–23)
CO2: 28 mmol/L (ref 22–32)
Calcium: 9.2 mg/dL (ref 8.9–10.3)
Chloride: 102 mmol/L (ref 98–111)
Creatinine, Ser: 1.12 mg/dL — ABNORMAL HIGH (ref 0.44–1.00)
GFR, Estimated: 56 mL/min — ABNORMAL LOW (ref >60)
Glucose, Bld: 176 mg/dL — ABNORMAL HIGH (ref 70–99)
Potassium: 4.2 mmol/L (ref 3.5–5.1)
Sodium: 137 mmol/L (ref 135–145)
Total Bilirubin: 0.7 mg/dL (ref 0.3–1.2)
Total Protein: 8.4 g/dL — ABNORMAL HIGH (ref 6.5–8.1)

## 2021-10-05 ENCOUNTER — Encounter: Payer: Self-pay | Admitting: Oncology

## 2021-10-05 ENCOUNTER — Inpatient Hospital Stay (HOSPITAL_BASED_OUTPATIENT_CLINIC_OR_DEPARTMENT_OTHER): Payer: Medicare HMO | Admitting: Oncology

## 2021-10-05 ENCOUNTER — Other Ambulatory Visit: Payer: Self-pay

## 2021-10-05 VITALS — BP 132/51 | HR 81 | Temp 98.7°F | Resp 20 | Wt 228.4 lb

## 2021-10-05 DIAGNOSIS — Z08 Encounter for follow-up examination after completed treatment for malignant neoplasm: Secondary | ICD-10-CM | POA: Diagnosis not present

## 2021-10-05 DIAGNOSIS — C3411 Malignant neoplasm of upper lobe, right bronchus or lung: Secondary | ICD-10-CM | POA: Diagnosis not present

## 2021-10-05 DIAGNOSIS — Z85118 Personal history of other malignant neoplasm of bronchus and lung: Secondary | ICD-10-CM | POA: Diagnosis not present

## 2021-10-05 DIAGNOSIS — I2510 Atherosclerotic heart disease of native coronary artery without angina pectoris: Secondary | ICD-10-CM | POA: Diagnosis not present

## 2021-10-05 DIAGNOSIS — I255 Ischemic cardiomyopathy: Secondary | ICD-10-CM | POA: Diagnosis not present

## 2021-10-05 DIAGNOSIS — Z923 Personal history of irradiation: Secondary | ICD-10-CM | POA: Diagnosis not present

## 2021-10-05 DIAGNOSIS — I059 Rheumatic mitral valve disease, unspecified: Secondary | ICD-10-CM | POA: Diagnosis not present

## 2021-10-05 DIAGNOSIS — I110 Hypertensive heart disease with heart failure: Secondary | ICD-10-CM | POA: Diagnosis not present

## 2021-10-05 DIAGNOSIS — I5042 Chronic combined systolic (congestive) and diastolic (congestive) heart failure: Secondary | ICD-10-CM | POA: Diagnosis not present

## 2021-10-05 DIAGNOSIS — R590 Localized enlarged lymph nodes: Secondary | ICD-10-CM | POA: Diagnosis not present

## 2021-10-05 DIAGNOSIS — I252 Old myocardial infarction: Secondary | ICD-10-CM | POA: Diagnosis not present

## 2021-10-05 NOTE — Progress Notes (Signed)
Patient has no concerns at the moment. 

## 2021-10-05 NOTE — Progress Notes (Signed)
Hematology/Oncology Consult note [facility]  [phone] [phone]  Patient Care Team: Virginia Crews, MD as PCP - General (Family Medicine) End, Harrell Gave, MD as PCP - Cardiology (Cardiology) Nestor Lewandowsky, MD (Inactive) as Referring Physician (Cardiothoracic Surgery) Sindy Guadeloupe, MD as Consulting Physician (Oncology) Sindy Guadeloupe, MD as Consulting Physician (Oncology) Noreene Filbert, MD as Consulting Physician (Radiation Oncology)   Name of the patient: Vanessa Carlson  [id number]  [birth year]   Date of visit: 10/05/21  Diagnosis- stage I lung cancer s/p SBRT  Chief complaint/ Reason for visit-discuss CT scan results and further management  Heme/Onc history: Patient is a 61 year old female with a past medical history significant for CAD, ischemic cardiomyopathy status post AICD placement, rheumatic heart disease status post mechanical mitral valve replacement on Coumadin.She was found to have a right upper lobe lung mass 2.5 cm that was hypermetabolic on PET CT scan in January 2019.  No evidence of nodal involvement.  She was not deemed to be a candidate for surgery and underwent SBRT to this lesion.  This was biopsy-proven adenocarcinoma with acinar and solid morphologies.  She then had a CT scan in January 2020 which showed enlarging satellite right upper lobe lung nodules.  This was followed by a PET CT scan which showed right upper lobe lung nodules 2 of them were hypermetabolic with an SUV of 8.7 was also a thyroid nodule approximately 4 mm in size which was not definitely hypermetabolic.  Patient was seen by Dr. Donella Stade following this and sh received SBRT for 5 fractions encompassing all these 3 nodules.  Patient has not had any systemic chemotherapy for her lung cancer so far.  There is a gradual increase in the size of her right upper lobe lung nodules in May 2022 and patient received SBRT to this area again by Dr. Donella Stade     Interval history-patient is doing well so far and denies any specific complaints at this time.  She has occasional right chest wall pain as well as numbness in the area of prior radiation.  Denies any shortness of breath.  Denies any recent health concerns.  ECOG PS- 1 Pain scale- 0 With history of stage I lung cancer s/p SBRT here to discuss CT scan results and further management    Review of systems- Review of Systems  Constitutional:  Negative for chills, fever, malaise/fatigue and weight loss.  HENT:  Negative for congestion, ear discharge and nosebleeds.   Eyes:  Negative for blurred vision.  Respiratory:  Negative for cough, hemoptysis, sputum production, shortness of breath and wheezing.   Cardiovascular:  Negative for chest pain, palpitations, orthopnea and claudication.  Gastrointestinal:  Negative for abdominal pain, blood in stool, constipation, diarrhea, heartburn, melena, nausea and vomiting.  Genitourinary:  Negative for dysuria, flank pain, frequency, hematuria and urgency.  Musculoskeletal:  Negative for back pain, joint pain and myalgias.  Skin:  Negative for rash.  Neurological:  Negative for dizziness, tingling, focal weakness, seizures, weakness and headaches.  Endo/Heme/Allergies:  Does not bruise/bleed easily.  Psychiatric/Behavioral:  Negative for depression and suicidal ideas. The patient does not have insomnia.      No Known Allergies   Past Medical History:  Diagnosis Date   AICD (automatic cardioverter/defibrillator) present    Anterior myocardial infarction ([place])    a. 10/2014 - occluded LAD, complicated by cardiogenic shock-->Med Rx as interventional team was unable to open LAD.   Cancer of upper lobe of right lung ([place])  09/2017   radiation therapy right side   Cervical cancer ([facility])    in remission for ~20 yrs, s/p hysterectomy   Chronic combined systolic and diastolic CHF (congestive heart failure) ([place])    a. 10/2015 Echo: EF 20-25%, sev diast  dysfxn, mildly reduced RV fxn. Nl MV prosthesis; b. 12/2017 Echo: EF 20-25%, diff HK. Sev ant/antsept HK, apical AK. Mild MS (mean graad 30mmHg). Mod TR. PASP 15mmHg.   Coronary artery disease    a. 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   Essential hypertension    Ischemic cardiomyopathy    a. 03/2015 s/p MDT single lead AICD;  b. 10/2015 Echo: EF 20-25%;  c. 12/2017 Echo: EF 20-25%, diff HK.   Mass of upper lobe of right lung    a. noted on CXR & CT 08/2017 w/ abnl PET CT.   Mitral valve disease    a. 2006 s/p MVR w/ 14mm SJM bileaflet mechanical valve-->chronic coumadin/ASA; b. 12/2017 Echo: Mild MS. Mean gradient 61mmHg.   Personal history of radiation therapy    f/u lung ca     Past Surgical History:  Procedure Laterality Date   APPENDECTOMY  1998   BREAST EXCISIONAL BIOPSY Left 80s   benign   CARDIAC CATHETERIZATION     CARDIAC DEFIBRILLATOR PLACEMENT     COLONOSCOPY WITH PROPOFOL N/A 01/11/2020   Procedure: COLONOSCOPY WITH PROPOFOL;  Surgeon: Jonathon Bellows, MD;  Location: [facility] ENDOSCOPY;  Service: Gastroenterology;  Laterality: N/A;   RADICAL HYSTERECTOMY  1998   SHOULDER SURGERY Bilateral    rotator cuff tears   VALVE REPLACEMENT     Mitral valve; 27 mm St. Jude bileaflet valve    Social History   Socioeconomic History   Marital status: Married    Spouse name: Tosin   Number of children: 2   Years of education: 14   Highest education level: Some college, no degree  Occupational History   Occupation: disable  Tobacco Use   Smoking status: Former    Packs/day: 1.00    Years: 4.00    Pack years: 4.00    Types: Cigarettes    Quit date: 1998    Years since quitting: 25.1   Smokeless tobacco: Never  Vaping Use   Vaping Use: Never used  Substance and Sexual Activity   Alcohol use: No   Drug use: No   Sexual activity: Yes    Partners: Male    Birth control/protection: Surgical   Other Topics Concern   Not on file  Social History Narrative   Not on file   Social Determinants of Health   Financial Resource Strain: Low Risk    Difficulty of Paying Living Expenses: Not hard at all  Food Insecurity: No Food Insecurity   Worried About Charity fundraiser in the Last Year: Never true   Ran Out of Food in the Last Year: Never true  Transportation Needs: No Transportation Needs   Lack of Transportation (Medical): No   Lack of Transportation (Non-Medical): No  Physical Activity: Inactive   Days of Exercise per Week: 0 days   Minutes of Exercise per Session: 0 min  Stress: No Stress Concern Present   Feeling of Stress : Not at all  Social Connections: Socially Integrated   Frequency of Communication with Friends and Family: More than three times a week   Frequency of Social Gatherings with Friends and Family: More than three times  a week   Attends Religious Services: More than 4 times per year   Active Member of Clubs or Organizations: Yes   Attends Music therapist: More than 4 times per year   Marital Status: Married  Human resources officer Violence: Not At Risk   Fear of Current or Ex-Partner: No   Emotionally Abused: No   Physically Abused: No   Sexually Abused: No    Family History  Problem Relation Age of Onset   Heart attack Mother    Hypertension Mother    Diabetes Mother    Emphysema Father        smoker   Diabetes Maternal Grandmother    Kidney disease Maternal Grandmother    Breast cancer Maternal Aunt    Breast cancer Maternal Aunt    Colon cancer Maternal Uncle      Current Outpatient Medications:    aspirin 81 MG chewable tablet, Chew 81 mg by mouth daily., Disp: , Rfl:    atorvastatin (LIPITOR) 80 MG tablet, TAKE 1 TABLET BY MOUTH ONCE DAILY. OFFICE VISIT AND LABS NEEDED FOR FURTHER REFILLS., Disp: 90 tablet, Rfl: 3   ciprofloxacin (CIPRO) 250 MG tablet, Take 1 tablet (250 mg total) by mouth 2 (two) times daily., Disp: 14  tablet, Rfl: 0   cyclobenzaprine (FLEXERIL) 5 MG tablet, Take 1 tablet (5 mg total) by mouth 3 (three) times daily as needed for muscle spasms., Disp: 30 tablet, Rfl: 1   ENTRESTO 24-26 MG, Take 1 tablet by mouth twice daily, Disp: 180 tablet, Rfl: 0   ezetimibe (ZETIA) 10 MG tablet, Take 1 tablet (10 mg total) by mouth daily., Disp: 90 tablet, Rfl: 3   fluticasone (FLONASE) 50 MCG/ACT nasal spray, Place 2 sprays into both nostrils as needed for allergies or rhinitis., Disp: , Rfl:    furosemide (LASIX) 40 MG tablet, Take 2 tablets by mouth twice daily, Disp: 360 tablet, Rfl: 0   gabapentin (NEURONTIN) 300 MG capsule, Take 1 capsule (300 mg total) by mouth 2 (two) times daily., Disp: 180 capsule, Rfl: 3   metoprolol succinate (TOPROL-XL) 25 MG 24 hr tablet, Take 1 tablet (25 mg total) by mouth in the morning and at bedtime., Disp: 180 tablet, Rfl: 0   mirabegron ER (MYRBETRIQ) 50 MG TB24 tablet, Take 1 tablet (50 mg total) by mouth daily., Disp: 30 tablet, Rfl: 11   nitroGLYCERIN (NITROSTAT) 0.3 MG SL tablet, Place 1 tablet (0.3 mg total) under the tongue every 5 (five) minutes as needed for chest pain., Disp: 30 tablet, Rfl: 3   spironolactone (ALDACTONE) 25 MG tablet, TAKE 1 TABLET BY MOUTH ONCE DAILY. APPOINTMENT NEEDED FOR FURTHER REFILLS., Disp: 90 tablet, Rfl: 0   traMADol (ULTRAM) 50 MG tablet, TAKE 1 TABLET BY MOUTH EVERY 6 HOURS AS NEEDED, Disp: 30 tablet, Rfl: 0   traZODone (DESYREL) 50 MG tablet, Take 0.5-1 tablets (25-50 mg total) by mouth at bedtime as needed for sleep., Disp: 30 tablet, Rfl: 3   warfarin (COUMADIN) 10 MG tablet, Take 5 mg by mouth daily. Takes on Saturdays, Disp: , Rfl:    warfarin (COUMADIN) 7.5 MG tablet, Take 1 tablet (7.5 mg total) by mouth daily at 4 PM. Take 1 tablet (7.5 mg total)  by mouth daily except Saturdays, Disp: 90 tablet, Rfl: 0  Physical exam:  Vitals:   10/05/21 0916  BP: (!) 132/51  Pulse: 81  Resp: 20  Temp: 98.7 F (37.1 C)  SpO2: 100%   Weight: 228 lb 6.4 oz (  103.6 kg)   Physical Exam   CMP Latest Ref Rng & Units 10/02/2021  Glucose 70 - 99 mg/dL 176(H)  BUN 8 - 23 mg/dL 14  Creatinine 0.44 - 1.00 mg/dL 1.12(H)  Sodium 135 - 145 mmol/L 137  Potassium 3.5 - 5.1 mmol/L 4.2  Chloride 98 - 111 mmol/L 102  CO2 22 - 32 mmol/L 28  Calcium 8.9 - 10.3 mg/dL 9.2  Total Protein 6.5 - 8.1 g/dL 8.4(H)  Total Bilirubin 0.3 - 1.2 mg/dL 0.7  Alkaline Phos 38 - 126 U/L 70  AST 15 - 41 U/L 24  ALT 0 - 44 U/L 18   CBC Latest Ref Rng & Units 10/02/2021  WBC 4.0 - 10.5 K/uL 5.5  Hemoglobin 12.0 - 15.0 g/dL 12.7  Hematocrit 36.0 - 46.0 % 38.9  Platelets 150 - 400 K/uL 186    No images are attached to the encounter.  CT Chest Wo Contrast  Result Date: 10/05/2021 CLINICAL DATA:  stage I lung cancer s/p SBRT, restaging examination EXAM: CT CHEST WITHOUT CONTRAST TECHNIQUE: Multidetector CT imaging of the chest was performed following the standard protocol without IV contrast. RADIATION DOSE REDUCTION: This exam was performed according to the departmental dose-optimization program which includes automated exposure control, adjustment of the mA and/or kV according to patient size and/or use of iterative reconstruction technique. COMPARISON:  06/15/2021, 11/25/2020 FINDINGS: Cardiovascular: Extensive multi-vessel coronary artery calcification. Mild global cardiomegaly with left ventricular dilation. Mitral valve replacement has been performed. Left subclavian pacemaker is in place with lead within the right ventricle. Calcification within the apical septal wall of the left ventricle likely relates to prior myocardial infarction. No pericardial effusion. Central pulmonary arteries are enlarged in keeping with changes of pulmonary arterial hypertension. Moderate atherosclerotic calcification within the thoracic aorta. No aortic aneurysm. Mediastinum/Nodes: Visualized thyroid is unremarkable. There is slight interval enlargement and increasing density  of a borderline pathologically enlarged right paratracheal lymph node seen at axial image # 35/2 measuring 10 x 12 mm (previously measuring 8 x 10 mm). No additional pathologic adenopathy. The esophagus is unremarkable. Lungs/Pleura: There is dense consolidation and fibrosis again identified involving the anterior and apical segments of the right upper lobe with associated volume loss in keeping with post radiation change. The area of consolidative change involving the anterior segment of the right upper lobe encompasses the hypermetabolic lesion noted on prior PET CT examination of 01/07/2021. When measured in similar fashion, the area of consolidation measures roughly 2.1 x 5.5 cm with slight interval thickening noted. No new focal pulmonary nodules or infiltrates are identified. No pneumothorax or pleural effusion. Upper Abdomen: No acute abnormality. Musculoskeletal: Nonunited rib fractures of the right second and third ribs anterolaterally are again identified. No suspicious lytic or blastic bone lesions are seen. IMPRESSION: Post radiation changes involving the right upper lobe encompassing the area of previously noted hypermetabolism within the anterior segment of the right upper lobe. Mild thickening of the area of consolidation may simply relate to retractile fibrotic change. There is, however, interval development of a borderline pathologically enlarged lymph node within the right paratracheal lymph node group demonstrating increasing density. While this may simply represent reactive change related to radiation pneumonitis, continued observation is warranted to exclude a developing nodal metastasis. Alternatively, PET CT examination may be helpful for further evaluation. Extensive coronary artery calcification. Mild global cardiomegaly. Left ventricular dilation. Evidence of prior apical septal left ventricular myocardial infarction. Aortic Atherosclerosis (ICD10-I70.0). Electronically Signed   By: Linwood Dibbles.D.  On: 10/05/2021 00:57     Assessment and plan- Patient is a 61 y.o. female with history of stage I lung cancer s/p SBRT here to discuss CT scan results and further management  I have reviewed CT chest images independently and discussed findings with the patient which overall shows postradiation changes in the right upper lobe and no evidence of new pulmonary abnormalities.  However patient noted to have slight interval enlargement of the right paratracheal lymph node which was previously 8 mm now 10 x 12 mm.  Unclear if this represents worsening malignancy versus changes related to radiation pneumonitis.  I am inclined to monitor this conservatively with a repeat CT chest in 3 months.  If there is progressive enlargement of her adenopathy I will consider getting a PET scan followed by bronchoscopy at that time.  Patient verbalized understanding of the plan   Visit Diagnosis 1. Malignant neoplasm of upper lobe of right lung ([place])   2. Encounter for follow-up surveillance of lung cancer      Dr. Randa Evens, MD, MPH [facility] [phone] 10/05/2021 12:30 PM

## 2021-10-06 ENCOUNTER — Ambulatory Visit (INDEPENDENT_AMBULATORY_CARE_PROVIDER_SITE_OTHER): Payer: Medicare HMO

## 2021-10-06 DIAGNOSIS — I255 Ischemic cardiomyopathy: Secondary | ICD-10-CM | POA: Diagnosis not present

## 2021-10-06 LAB — CUP PACEART REMOTE DEVICE CHECK
Battery Remaining Longevity: 67 mo
Battery Voltage: 2.99 V
Brady Statistic RV Percent Paced: 0.01 %
Date Time Interrogation Session: 20230207033424
HighPow Impedance: 85 Ohm
Implantable Lead Implant Date: 20160802
Implantable Lead Location: 753860
Implantable Pulse Generator Implant Date: 20160802
Lead Channel Impedance Value: 456 Ohm
Lead Channel Impedance Value: 551 Ohm
Lead Channel Pacing Threshold Amplitude: 0.5 V
Lead Channel Pacing Threshold Pulse Width: 0.4 ms
Lead Channel Sensing Intrinsic Amplitude: 14.875 mV
Lead Channel Sensing Intrinsic Amplitude: 14.875 mV
Lead Channel Setting Pacing Amplitude: 2 V
Lead Channel Setting Pacing Pulse Width: 0.4 ms
Lead Channel Setting Sensing Sensitivity: 0.3 mV

## 2021-10-08 NOTE — Progress Notes (Signed)
Remote ICD transmission.   

## 2021-10-10 ENCOUNTER — Other Ambulatory Visit: Payer: Self-pay | Admitting: Family Medicine

## 2021-10-10 DIAGNOSIS — Z952 Presence of prosthetic heart valve: Secondary | ICD-10-CM

## 2021-10-12 ENCOUNTER — Ambulatory Visit (INDEPENDENT_AMBULATORY_CARE_PROVIDER_SITE_OTHER): Payer: Medicare HMO

## 2021-10-12 DIAGNOSIS — I5022 Chronic systolic (congestive) heart failure: Secondary | ICD-10-CM | POA: Diagnosis not present

## 2021-10-12 DIAGNOSIS — Z95810 Presence of automatic (implantable) cardiac defibrillator: Secondary | ICD-10-CM

## 2021-10-14 ENCOUNTER — Other Ambulatory Visit: Payer: Self-pay | Admitting: Family Medicine

## 2021-10-14 DIAGNOSIS — M545 Low back pain, unspecified: Secondary | ICD-10-CM

## 2021-10-14 NOTE — Progress Notes (Signed)
EPIC Encounter for ICM Monitoring  Patient Name: PEYTEN PUNCHES is a 61 y.o. female Date: 10/14/2021 Primary Care Physican: Virginia Crews, MD Primary Cardiologist: Carlis Abbott, NP HF Electrophysiologist: Caryl Comes 10/05/2021 Office Weight: 228 lbs        Transmission reviewed.    Optivol thoracic impedance suggesting normal fluid levels.    Prescribed:  Furosemide 40 mg 2 tablets (80 mg total) twice a day.   Spironolactone 25 mg take 1 tablet daily   Labs: 10/02/2021 Creatinine 1.12, BUN 14, Potassium 4.2, Sodium 137, GFR 56 05/29/2021 Creatinine 0.98, BUN 11, Potassium 3.6, Sodium 137, GFR >60 05/28/2021 Creatinine 1.11, BUN 15, Potassium 3.5, Sodium 142, GFR 57 A complete set of results can be found in Results Review.   Recommendations: No changes.   Follow-up plan: ICM clinic phone appointment on 11/16/2021.   91 day device clinic remote transmission 01/05/2022.       EP/Cardiology Office Visits:  Recall 01/07/2021 with Dr. Caryl Comes.  Recall 12/06/2021 with Dr End.   Copy of ICM check sent to Dr. Caryl Comes.      3 month ICM trend: 10/12/2021.    12-14 Month ICM trend:     [name], RN 10/14/2021 2:07 PM

## 2021-10-16 ENCOUNTER — Other Ambulatory Visit: Payer: Self-pay

## 2021-10-16 ENCOUNTER — Encounter: Payer: Self-pay | Admitting: Family Medicine

## 2021-10-16 MED ORDER — ONDANSETRON HCL 4 MG PO TABS: 4.0000 mg | ORAL_TABLET | Freq: Three times a day (TID) | ORAL | 0 refills | Status: DC | PRN

## 2021-10-16 NOTE — Telephone Encounter (Signed)
Ok to send in Zofran 4mg  TID prn #20 r0

## 2021-10-23 ENCOUNTER — Telehealth: Payer: Self-pay

## 2021-10-23 NOTE — Telephone Encounter (Signed)
Copied from [place] [id number]. Topic: Appointment Scheduling - Scheduling Inquiry for Clinic >> Oct 23, 2021 11:48 AM Valere Dross wrote: Reason for CRM: Pt called in statin she wants to reschedule her 02/28 AWV appt, please advise.

## 2021-10-24 ENCOUNTER — Encounter: Payer: Self-pay | Admitting: Family Medicine

## 2021-10-24 DIAGNOSIS — G6282 Radiation-induced polyneuropathy: Secondary | ICD-10-CM

## 2021-10-26 MED ORDER — GABAPENTIN 300 MG PO CAPS: 300.0000 mg | ORAL_CAPSULE | Freq: Two times a day (BID) | ORAL | 3 refills | Status: DC

## 2021-10-27 ENCOUNTER — Telehealth: Payer: Self-pay | Admitting: Internal Medicine

## 2021-10-27 DIAGNOSIS — M1711 Unilateral primary osteoarthritis, right knee: Secondary | ICD-10-CM | POA: Diagnosis not present

## 2021-10-27 NOTE — Telephone Encounter (Signed)
° °  Pre-operative Risk Assessment    Patient Name: Vanessa Carlson  DOB: 19-Aug-1961 MRN: [id number]      Request for Surgical Clearance    Procedure:   RT Traditional knee  Date of Surgery:  Clearance 12/10/21                                 Surgeon:  Dr Kurtis Bushman Surgeon's Group or Practice Name:  [facility] Phone number:  [phone] Fax number:  [phone]   Type of Clearance Requested:   - Pharmacy:  Hold Aspirin and Warfarin (Coumadin) instructions    Type of Anesthesia:  Local Perlie Mayo    Additional requests/questions:    Manfred Arch   10/27/2021, 4:23 PM

## 2021-10-28 ENCOUNTER — Other Ambulatory Visit: Payer: Self-pay

## 2021-10-28 ENCOUNTER — Ambulatory Visit (INDEPENDENT_AMBULATORY_CARE_PROVIDER_SITE_OTHER): Payer: Medicare HMO

## 2021-10-28 DIAGNOSIS — Z952 Presence of prosthetic heart valve: Secondary | ICD-10-CM | POA: Diagnosis not present

## 2021-10-28 DIAGNOSIS — I480 Paroxysmal atrial fibrillation: Secondary | ICD-10-CM

## 2021-10-28 LAB — POCT INR
INR: 5.3 — AB (ref 2.0–3.0)
PT: 64.2

## 2021-10-28 NOTE — Patient Instructions (Signed)
Description   ?Hold for 3 days, 5mg  M W F then 7.5mg  daily. F/U in 2 wks. ?  ? ? ?

## 2021-10-29 ENCOUNTER — Ambulatory Visit (INDEPENDENT_AMBULATORY_CARE_PROVIDER_SITE_OTHER): Payer: Medicare HMO

## 2021-10-29 ENCOUNTER — Other Ambulatory Visit: Payer: Self-pay

## 2021-10-29 VITALS — BP 102/60 | HR 79 | Temp 98.1°F | Ht 62.0 in | Wt 228.2 lb

## 2021-10-29 DIAGNOSIS — Z0000 Encounter for general adult medical examination without abnormal findings: Secondary | ICD-10-CM

## 2021-10-29 DIAGNOSIS — Z1231 Encounter for screening mammogram for malignant neoplasm of breast: Secondary | ICD-10-CM

## 2021-10-29 DIAGNOSIS — I429 Cardiomyopathy, unspecified: Secondary | ICD-10-CM | POA: Insufficient documentation

## 2021-10-29 DIAGNOSIS — I00 Rheumatic fever without heart involvement: Secondary | ICD-10-CM | POA: Insufficient documentation

## 2021-10-29 DIAGNOSIS — I059 Rheumatic mitral valve disease, unspecified: Secondary | ICD-10-CM | POA: Insufficient documentation

## 2021-10-29 NOTE — Telephone Encounter (Signed)
? ?  Primary Cardiologist: [name], MD ? ?Chart reviewed as part of pre-operative protocol coverage. Given past medical history and time since last visit, based on ACC/AHA guidelines, Vanessa Carlson would be at acceptable risk for the planned procedure without further cardiovascular testing.  ? ?Patients warfarin is not  managed by cardiology service.  Recommendations for holding warfarin will need to come from the prescribing provider.  Her aspirin may be held for 5-7 days prior to her surgery.  Please resume as soon as hemostasis is achieved. ? ?Patient was advised that if she develops new symptoms prior to surgery to contact our office to arrange a follow-up appointment.  She verbalized understanding. ? ?I will route this recommendation to the requesting party via Epic fax function and remove from pre-op pool. ? ?Please call with questions. ? ?Jossie Ng. Kiani Wurtzel NP-C ? ?  ?10/29/2021, 12:26 PM ?[place] ?[address] ?Office [phone] Fax [phone] ? ? ? ? ?

## 2021-10-29 NOTE — Progress Notes (Signed)
Subjective:   Vanessa Carlson is a 61 y.o. female who presents for Medicare Annual (Subsequent) preventive examination.  Review of Systems           Objective:    Today's Vitals   10/29/21 1541  BP: 102/60  Pulse: 79  Temp: 98.1 F (36.7 C)  TempSrc: Oral  SpO2: 97%  Weight: 228 lb 3.2 oz (103.5 kg)  Height: 5\' 2"  (1.575 m)   Body mass index is 41.74 kg/m.  Advanced Directives 10/05/2021 06/26/2021 05/28/2021 03/23/2021 01/01/2021 12/23/2020 10/21/2020  Does Patient Have a Medical Advance Directive? No No No No No No No  Would patient like information on creating a medical advance directive? Yes (MAU/Ambulatory/Procedural Areas - Information given) No - Patient declined No - Patient declined No - Patient declined - - No - Patient declined    Current Medications (verified) Outpatient Encounter Medications as of 10/29/2021  Medication Sig   aspirin 81 MG chewable tablet Chew 81 mg by mouth daily.   atorvastatin (LIPITOR) 80 MG tablet TAKE 1 TABLET BY MOUTH ONCE DAILY. OFFICE VISIT AND LABS NEEDED FOR FURTHER REFILLS.   ciprofloxacin (CIPRO) 250 MG tablet Take 1 tablet (250 mg total) by mouth 2 (two) times daily.   cyclobenzaprine (FLEXERIL) 5 MG tablet Take 1 tablet (5 mg total) by mouth 3 (three) times daily as needed for muscle spasms.   ENTRESTO 24-26 MG Take 1 tablet by mouth twice daily   ezetimibe (ZETIA) 10 MG tablet Take 1 tablet (10 mg total) by mouth daily.   fluticasone (FLONASE) 50 MCG/ACT nasal spray Place 2 sprays into both nostrils as needed for allergies or rhinitis.   furosemide (LASIX) 40 MG tablet Take 2 tablets by mouth twice daily   gabapentin (NEURONTIN) 300 MG capsule Take 1 capsule (300 mg total) by mouth 2 (two) times daily.   metoprolol succinate (TOPROL-XL) 25 MG 24 hr tablet Take 1 tablet (25 mg total) by mouth in the morning and at bedtime.   mirabegron ER (MYRBETRIQ) 50 MG TB24 tablet Take 1 tablet (50 mg total) by mouth daily.   nitroGLYCERIN  (NITROSTAT) 0.3 MG SL tablet Place 1 tablet (0.3 mg total) under the tongue every 5 (five) minutes as needed for chest pain.   ondansetron (ZOFRAN) 4 MG tablet Take 1 tablet (4 mg total) by mouth every 8 (eight) hours as needed for nausea or vomiting.   spironolactone (ALDACTONE) 25 MG tablet TAKE 1 TABLET BY MOUTH ONCE DAILY. APPOINTMENT NEEDED FOR FURTHER REFILLS.   traMADol (ULTRAM) 50 MG tablet TAKE 1 TABLET BY MOUTH EVERY 6 HOURS AS NEEDED   traZODone (DESYREL) 50 MG tablet Take 0.5-1 tablets (25-50 mg total) by mouth at bedtime as needed for sleep.   warfarin (COUMADIN) 10 MG tablet Take 5 mg by mouth daily. Takes on Saturdays   warfarin (COUMADIN) 7.5 MG tablet TAKE 1 TABLET BY MOUTH ONCE DAILY AT 4 PM. TAKE 1 TABLET BY MOUTH DAILY EXCEPT SATURDAYS   No facility-administered encounter medications on file as of 10/29/2021.    Allergies (verified) Patient has no known allergies.   History: Past Medical History:  Diagnosis Date   AICD (automatic cardioverter/defibrillator) present    Anterior myocardial infarction ([place])    a. 10/2014 - occluded LAD, complicated by cardiogenic shock-->Med Rx as interventional team was unable to open LAD.   Cancer of upper lobe of right lung ([place]) 09/2017   radiation therapy right side   Cervical cancer ([place])    in  remission for ~20 yrs, s/p hysterectomy   Chronic combined systolic and diastolic CHF (congestive heart failure) ([place])    a. 10/2015 Echo: EF 20-25%, sev diast dysfxn, mildly reduced RV fxn. Nl MV prosthesis; b. 12/2017 Echo: EF 20-25%, diff HK. Sev ant/antsept HK, apical AK. Mild MS (mean graad 75mmHg). Mod TR. PASP 12mmHg.   Coronary artery disease    a. 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   Essential hypertension    Ischemic cardiomyopathy    a. 03/2015 s/p MDT single lead AICD;  b. 10/2015 Echo: EF 20-25%;  c. 12/2017 Echo: EF 20-25%, diff HK.   Mass  of upper lobe of right lung    a. noted on CXR & CT 08/2017 w/ abnl PET CT.   Mitral valve disease    a. 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 12/2017 Echo: Mild MS. Mean gradient 37mmHg.   Personal history of radiation therapy    f/u lung ca   Past Surgical History:  Procedure Laterality Date   APPENDECTOMY  1998   BREAST EXCISIONAL BIOPSY Left 80s   benign   CARDIAC CATHETERIZATION     CARDIAC DEFIBRILLATOR PLACEMENT     COLONOSCOPY WITH PROPOFOL N/A 01/11/2020   Procedure: COLONOSCOPY WITH PROPOFOL;  Surgeon: Jonathon Bellows, MD;  Location: [facility] ENDOSCOPY;  Service: Gastroenterology;  Laterality: N/A;   RADICAL HYSTERECTOMY  1998   SHOULDER SURGERY Bilateral    rotator cuff tears   VALVE REPLACEMENT     Mitral valve; 27 mm St. Jude bileaflet valve   Family History  Problem Relation Age of Onset   Heart attack Mother    Hypertension Mother    Diabetes Mother    Emphysema Father        smoker   Diabetes Maternal Grandmother    Kidney disease Maternal Grandmother    Breast cancer Maternal Aunt    Breast cancer Maternal Aunt    Colon cancer Maternal Uncle    Social History   Socioeconomic History   Marital status: Married    Spouse name: Tosin   Number of children: 2   Years of education: 14   Highest education level: Some college, no degree  Occupational History   Occupation: disable  Tobacco Use   Smoking status: Former    Packs/day: 1.00    Years: 4.00    Pack years: 4.00    Types: Cigarettes    Quit date: 1998    Years since quitting: 25.1   Smokeless tobacco: Never  Vaping Use   Vaping Use: Never used  Substance and Sexual Activity   Alcohol use: No   Drug use: No   Sexual activity: Yes    Partners: Male    Birth control/protection: Surgical  Other Topics Concern   Not on file  Social History Narrative   Not on file   Social Determinants of Health   Financial Resource Strain: Not on file  Food Insecurity: Not  on file  Transportation Needs: Not on file  Physical Activity: Not on file  Stress: Not on file  Social Connections: Not on file    Tobacco Counseling Counseling given: Not Answered   Clinical Intake:  Pre-visit preparation completed: Yes  Pain : No/denies pain     Nutritional Risks: None Diabetes: No  How often do you need to have someone help you when you read instructions, pamphlets, or other written materials  from your doctor or pharmacy?: 1 - Never  Diabetic?no  Interpreter Needed?: No  Information entered by :: Kirke Shaggy, LPN   Activities of Daily Living In your present state of health, do you have any difficulty performing the following activities: 05/28/2021 12/23/2020  Hearing? N N  Vision? N N  Difficulty concentrating or making decisions? N N  Walking or climbing stairs? N N  Dressing or bathing? N N  Doing errands, shopping? N -  Some recent data might be hidden    Patient Care Team: Brita Romp Dionne Bucy, MD as PCP - General (Family Medicine) End, Harrell Gave, MD as PCP - Cardiology (Cardiology) Nestor Lewandowsky, MD (Inactive) as Referring Physician (Cardiothoracic Surgery) Sindy Guadeloupe, MD as Consulting Physician (Oncology) Sindy Guadeloupe, MD as Consulting Physician (Oncology) Noreene Filbert, MD as Consulting Physician (Radiation Oncology)  Indicate any recent Medical Services you may have received from other than [facility] providers in the past year (date may be approximate).     Assessment:   This is a routine wellness examination for Aseret.  Hearing/Vision screen No results found.  Dietary issues and exercise activities discussed:     Goals Addressed   None    Depression Screen PHQ 2/9 Scores 10/21/2020 12/14/2019 10/15/2019 05/31/2018 03/29/2018 03/08/2018 02/28/2018  PHQ - 2 Score 0 1 0 0 0 0 0  PHQ- 9 Score - 1 - - - - -    Fall Risk Fall Risk  10/21/2020 12/14/2019 10/15/2019 05/31/2018 03/29/2018  Falls in the past year? 0 1 1 No No   Number falls in past yr: 0 0 0 - -  Injury with Fall? 0 1 1 - -  Comment - - sprained right knee - -  Risk for fall due to : - Other (Comment) - - -  Follow up - Falls evaluation completed Falls prevention discussed - -    FALL RISK PREVENTION PERTAINING TO THE HOME:  Any stairs in or around the home? Yes  If so, are there any without handrails? No  Home free of loose throw rugs in walkways, pet beds, electrical cords, etc? Yes  Adequate lighting in your home to reduce risk of falls? Yes   ASSISTIVE DEVICES UTILIZED TO PREVENT FALLS:  Life alert? No  Use of a cane, walker or w/c? Yes  Grab bars in the bathroom? Yes  Shower chair or bench in shower? No  Elevated toilet seat or a handicapped toilet? No   TIMED UP AND GO:  Was the test performed? Yes .  Length of time to ambulate 10 feet: 4 sec.   Gait steady and fast without use of assistive device  Cognitive Function:     6CIT Screen 12/14/2019 10/15/2019 03/08/2018  What Year? 0 points 0 points 0 points  What month? 0 points 0 points 0 points  What time? 0 points 0 points 0 points  Count back from 20 0 points 0 points 0 points  Months in reverse 0 points 2 points 0 points  Repeat phrase 2 points 0 points 2 points  Total Score 2 2 2     Immunizations Immunization History  Administered Date(s) Administered   Influenza Split 05/17/2016   Influenza,inj,Quad PF,6+ Mos 06/20/2015, 07/08/2017, 09/08/2018, 05/29/2021   PFIZER(Purple Top)SARS-COV-2 Vaccination 06/05/2020, 06/28/2020   Pneumococcal Polysaccharide-23 12/03/2008    TDAP status: Due, Education has been provided regarding the importance of this vaccine. Advised may receive this vaccine at local pharmacy or Health Dept. Aware to provide a copy of the  vaccination record if obtained from local pharmacy or Health Dept. Verbalized acceptance and understanding.  Flu Vaccine status: Up to date  Pneumococcal vaccine status: Up to date  Covid-19 vaccine status:  Completed vaccines  Qualifies for Shingles Vaccine? Yes   Zostavax completed No   Shingrix Completed?: Yes  Screening Tests Health Maintenance  Topic Date Due   Zoster Vaccines- Shingrix (1 of 2) Never done   COVID-19 Vaccine (3 - Pfizer risk series) 07/26/2020   MAMMOGRAM  09/11/2021   TETANUS/TDAP  09/09/2023 (Originally 12/30/1979)   PAP SMEAR-Modifier  08/04/2024   COLONOSCOPY (Pts 45-43yrs Insurance coverage will need to be confirmed)  01/10/2030   INFLUENZA VACCINE  Completed   Hepatitis C Screening  Completed   HIV Screening  Completed   HPV VACCINES  Aged Out    Health Maintenance  Health Maintenance Due  Topic Date Due   Zoster Vaccines- Shingrix (1 of 2) Never done   COVID-19 Vaccine (3 - Pfizer risk series) 07/26/2020   MAMMOGRAM  09/11/2021    Colorectal cancer screening: Type of screening: Colonoscopy. Completed 01/11/20. Repeat every 10 years  Mammogram status: Completed 09/12/19. Repeat every year- referral sent    Lung Cancer Screening: (Low Dose CT Chest recommended if Age 51-80 years, 30 pack-year currently smoking OR have quit w/in 15years.) does not qualify.    Additional Screening:  Hepatitis C Screening: does qualify; Completed 07/08/17  Vision Screening: Recommended annual ophthalmology exams for early detection of glaucoma and other disorders of the eye. Is the patient up to date with their annual eye exam?  No  Who is the provider or what is the name of the office in which the patient attends annual eye exams? none If pt is not established with a provider, would they like to be referred to a provider to establish care? No .   Dental Screening: Recommended annual dental exams for proper oral hygiene  Community Resource Referral / Chronic Care Management: CRR required this visit?  No   CCM required this visit?  No      Plan:     I have personally reviewed and noted the following in the patients chart:   Medical and social history Use  of alcohol, tobacco or illicit drugs  Current medications and supplements including opioid prescriptions.  Functional ability and status Nutritional status Physical activity Advanced directives List of other physicians Hospitalizations, surgeries, and ER visits in previous 12 months Vitals Screenings to include cognitive, depression, and falls Referrals and appointments  In addition, I have reviewed and discussed with patient certain preventive protocols, quality metrics, and best practice recommendations. A written personalized care plan for preventive services as well as general preventive health recommendations were provided to patient.     Dionisio David, LPN   09/02/4313   Nurse Notes: none

## 2021-10-29 NOTE — Telephone Encounter (Signed)
Patient with diagnosis of PAF on warfarin for anticoagulation.   ? ?Procedure: RT Traditional knee ?Date of procedure: 12/10/21  ? ?We do manage patient's warfarin/INR.  Directions for hold and possible bridge soul be sent to Dr Nancy Nordmann office ? ? ?

## 2021-10-29 NOTE — Patient Instructions (Signed)
[name] , Thank you for taking time to come for your Medicare Wellness Visit. I appreciate your ongoing commitment to your health goals. Please review the following plan we discussed and let me know if I can assist you in the future.   Screening recommendations/referrals: Colonoscopy: 01/11/20 Mammogram: 09/12/19, referral sent Bone Density: n/d Recommended yearly ophthalmology/optometry visit for glaucoma screening and checkup Recommended yearly dental visit for hygiene and checkup  Vaccinations: Influenza vaccine: 05/29/21 Pneumococcal vaccine: 12/03/08 Tdap vaccine: n/d Shingles vaccine: n/d  Covid-19: 06/05/20, 06/28/20  Advanced directives: no  Conditions/risks identified: none  Next appointment: Follow up in one year for your annual wellness visit. 11/02/22 @ 3pm in person  Preventive Care 40-64 Years, Female Preventive care refers to lifestyle choices and visits with your health care provider that can promote health and wellness. What does preventive care include? A yearly physical exam. This is also called an annual well check. Dental exams once or twice a year. Routine eye exams. Ask your health care provider how often you should have your eyes checked. Personal lifestyle choices, including: Daily care of your teeth and gums. Regular physical activity. Eating a healthy diet. Avoiding tobacco and drug use. Limiting alcohol use. Practicing safe sex. Taking low-dose aspirin daily starting at age 49. Taking vitamin and mineral supplements as recommended by your health care provider. What happens during an annual well check? The services and screenings done by your health care provider during your annual well check will depend on your age, overall health, lifestyle risk factors, and family history of disease. Counseling  Your health care provider may ask you questions about your: Alcohol use. Tobacco use. Drug use. Emotional well-being. Home and relationship  well-being. Sexual activity. Eating habits. Work and work Statistician. Method of birth control. Menstrual cycle. Pregnancy history. Screening  You may have the following tests or measurements: Height, weight, and BMI. Blood pressure. Lipid and cholesterol levels. These may be checked every 5 years, or more frequently if you are over 75 years old. Skin check. Lung cancer screening. You may have this screening every year starting at age 57 if you have a 30-pack-year history of smoking and currently smoke or have quit within the past 15 years. Fecal occult blood test (FOBT) of the stool. You may have this test every year starting at age 46. Flexible sigmoidoscopy or colonoscopy. You may have a sigmoidoscopy every 5 years or a colonoscopy every 10 years starting at age 68. Hepatitis C blood test. Hepatitis B blood test. Sexually transmitted disease (STD) testing. Diabetes screening. This is done by checking your blood sugar (glucose) after you have not eaten for a while (fasting). You may have this done every 1-3 years. Mammogram. This may be done every 1-2 years. Talk to your health care provider about when you should start having regular mammograms. This may depend on whether you have a family history of breast cancer. BRCA-related cancer screening. This may be done if you have a family history of breast, ovarian, tubal, or peritoneal cancers. Pelvic exam and Pap test. This may be done every 3 years starting at age 76. Starting at age 91, this may be done every 5 years if you have a Pap test in combination with an HPV test. Bone density scan. This is done to screen for osteoporosis. You may have this scan if you are at high risk for osteoporosis. Discuss your test results, treatment options, and if necessary, the need for more tests with your health care provider. Vaccines  Your health care provider may recommend certain vaccines, such as: Influenza vaccine. This is recommended every  year. Tetanus, diphtheria, and acellular pertussis (Tdap, Td) vaccine. You may need a Td booster every 10 years. Zoster vaccine. You may need this after age 25. Pneumococcal 13-valent conjugate (PCV13) vaccine. You may need this if you have certain conditions and were not previously vaccinated. Pneumococcal polysaccharide (PPSV23) vaccine. You may need one or two doses if you smoke cigarettes or if you have certain conditions. Talk to your health care provider about which screenings and vaccines you need and how often you need them. This information is not intended to replace advice given to you by your health care provider. Make sure you discuss any questions you have with your health care provider. Document Released: 09/12/2015 Document Revised: 05/05/2016 Document Reviewed: 06/17/2015 Elsevier Interactive Patient Education  2017 [employer] Prevention in the Home Falls can cause injuries. They can happen to people of all ages. There are many things you can do to make your home safe and to help prevent falls. What can I do on the outside of my home? Regularly fix the edges of walkways and driveways and fix any cracks. Remove anything that might make you trip as you walk through a door, such as a raised step or threshold. Trim any bushes or trees on the path to your home. Use bright outdoor lighting. Clear any walking paths of anything that might make someone trip, such as rocks or tools. Regularly check to see if handrails are loose or broken. Make sure that both sides of any steps have handrails. Any raised decks and porches should have guardrails on the edges. Have any leaves, snow, or ice cleared regularly. Use sand or salt on walking paths during winter. Clean up any spills in your garage right away. This includes oil or grease spills. What can I do in the bathroom? Use night lights. Install grab bars by the toilet and in the tub and shower. Do not use towel bars as grab  bars. Use non-skid mats or decals in the tub or shower. If you need to sit down in the shower, use a plastic, non-slip stool. Keep the floor dry. Clean up any water that spills on the floor as soon as it happens. Remove soap buildup in the tub or shower regularly. Attach bath mats securely with double-sided non-slip rug tape. Do not have throw rugs and other things on the floor that can make you trip. What can I do in the bedroom? Use night lights. Make sure that you have a light by your bed that is easy to reach. Do not use any sheets or blankets that are too big for your bed. They should not hang down onto the floor. Have a firm chair that has side arms. You can use this for support while you get dressed. Do not have throw rugs and other things on the floor that can make you trip. What can I do in the kitchen? Clean up any spills right away. Avoid walking on wet floors. Keep items that you use a lot in easy-to-reach places. If you need to reach something above you, use a strong step stool that has a grab bar. Keep electrical cords out of the way. Do not use floor polish or wax that makes floors slippery. If you must use wax, use non-skid floor wax. Do not have throw rugs and other things on the floor that can make you trip. What  can I do with my stairs? Do not leave any items on the stairs. Make sure that there are handrails on both sides of the stairs and use them. Fix handrails that are broken or loose. Make sure that handrails are as long as the stairways. Check any carpeting to make sure that it is firmly attached to the stairs. Fix any carpet that is loose or worn. Avoid having throw rugs at the top or bottom of the stairs. If you do have throw rugs, attach them to the floor with carpet tape. Make sure that you have a light switch at the top of the stairs and the bottom of the stairs. If you do not have them, ask someone to add them for you. What else can I do to help prevent  falls? Wear shoes that: Do not have high heels. Have rubber bottoms. Are comfortable and fit you well. Are closed at the toe. Do not wear sandals. If you use a stepladder: Make sure that it is fully opened. Do not climb a closed stepladder. Make sure that both sides of the stepladder are locked into place. Ask someone to hold it for you, if possible. Clearly mark and make sure that you can see: Any grab bars or handrails. First and last steps. Where the edge of each step is. Use tools that help you move around (mobility aids) if they are needed. These include: Canes. Walkers. Scooters. Crutches. Turn on the lights when you go into a dark area. Replace any light bulbs as soon as they burn out. Set up your furniture so you have a clear path. Avoid moving your furniture around. If any of your floors are uneven, fix them. If there are any pets around you, be aware of where they are. Review your medicines with your doctor. Some medicines can make you feel dizzy. This can increase your chance of falling. Ask your doctor what other things that you can do to help prevent falls. This information is not intended to replace advice given to you by your health care provider. Make sure you discuss any questions you have with your health care provider. Document Released: 06/12/2009 Document Revised: 01/22/2016 Document Reviewed: 09/20/2014 Elsevier Interactive Patient Education  2017 [employer].

## 2021-11-07 ENCOUNTER — Other Ambulatory Visit: Payer: Self-pay | Admitting: Family Medicine

## 2021-11-09 ENCOUNTER — Encounter: Payer: Self-pay | Admitting: Urology

## 2021-11-09 ENCOUNTER — Other Ambulatory Visit: Payer: Self-pay

## 2021-11-09 ENCOUNTER — Ambulatory Visit (INDEPENDENT_AMBULATORY_CARE_PROVIDER_SITE_OTHER): Payer: Medicare HMO | Admitting: Urology

## 2021-11-09 VITALS — BP 121/79 | HR 80 | Ht 62.0 in | Wt 228.0 lb

## 2021-11-09 DIAGNOSIS — N3281 Overactive bladder: Secondary | ICD-10-CM

## 2021-11-09 DIAGNOSIS — N3946 Mixed incontinence: Secondary | ICD-10-CM | POA: Diagnosis not present

## 2021-11-09 MED ORDER — GEMTESA 75 MG PO TABS: 75.0000 mg | ORAL_TABLET | Freq: Every day | ORAL | 11 refills | Status: DC

## 2021-11-09 NOTE — Telephone Encounter (Signed)
Requested medication (s) are due for refill today: yes ? ?Requested medication (s) are on the active medication list: yes ? ?Last refill:  spironolactone 08/11/21 #90/0, tramadol 10/15/21 #30/0 ? ?Future visit scheduled: no ? ?Notes to clinic:  pt is due for an appt and labs. Pt called, LVMTCB ? ? ?  ?Requested Prescriptions  ?Pending Prescriptions Disp Refills  ? traMADol (ULTRAM) 50 MG tablet [Pharmacy Med Name: traMADol HCl 50 MG Oral Tablet] 30 tablet 0  ?  Sig: TAKE 1 TABLET BY MOUTH EVERY 6 HOURS AS NEEDED  ?  ? Not Delegated - Analgesics:  Opioid Agonists Failed - 11/07/2021  1:01 PM  ?  ?  Failed - This refill cannot be delegated  ?  ?  Failed - Urine Drug Screen completed in last 360 days  ?  ?  Passed - Valid encounter within last 3 months  ?  Recent Outpatient Visits   ? ?      ? 3 months ago Medicare annual wellness visit, subsequent  ? [facility] Gwyneth Sprout, FNP  ? 10 months ago Status post mitral valve replacement  ? [facility] [place], Dionne Bucy, MD  ? 1 year ago Allergic conjunctivitis of right eye  ? [facility] [place], Dionne Bucy, MD  ? 1 year ago Encounter for annual wellness visit (AWV) in Medicare patient  ? [facility] [place], Dionne Bucy, MD  ? 1 year ago Muscle spasm  ? [place], [place]  ? ?  ?  ? ?  ?  ?  ? spironolactone (ALDACTONE) 25 MG tablet [Pharmacy Med Name: Spironolactone 25 MG Oral Tablet] 90 tablet 0  ?  Sig: TAKE 1 TABLET BY MOUTH ONCE DAILY . APPOINTMENT REQUIRED FOR FUTURE REFILLS.  ?  ? Cardiovascular: Diuretics - Aldosterone Antagonist Failed - 11/07/2021  1:01 PM  ?  ?  Failed - Cr in normal range and within 180 days  ?  Creat  ?Date Value Ref Range Status  ?07/08/2017 1.14 (H) 0.50 - 1.05 mg/dL Final  ?  Comment:  ?  For patients >94 years of age, the reference limit ?for Creatinine is approximately 13% higher for people ?identified as African-American. ?. ?   ? ?Creatinine, Ser  ?Date Value Ref Range Status  ?10/02/2021 1.12 (H) 0.44 - 1.00 mg/dL Final  ?  ?  ?  ?  Passed - K in normal range and within 180 days  ?  Potassium  ?Date Value Ref Range Status  ?10/02/2021 4.2 3.5 - 5.1 mmol/L Final  ?  ?  ?  ?  Passed - Na in normal range and within 180 days  ?  Sodium  ?Date Value Ref Range Status  ?10/02/2021 137 135 - 145 mmol/L Final  ?12/19/2019 145 (H) 134 - 144 mmol/L Final  ?  ?  ?  ?  Passed - eGFR is 30 or above and within 180 days  ?  GFR, Est African American  ?Date Value Ref Range Status  ?07/08/2017 62 > OR = 60 mL/min/1.74m Final  ? ?GFR calc Af Amer  ?Date Value Ref Range Status  ?03/19/2020 >60 >60 mL/min Final  ? ?GFR, Est Non African American  ?Date Value Ref Range Status  ?07/08/2017 53 (L) > OR = 60 mL/min/1.746mFinal  ? ?GFR, Estimated  ?Date Value Ref Range Status  ?10/02/2021 56 (L) >60 mL/min Final  ?  Comment:  ?  (NOTE) ?Calculated using  the CKD-EPI Creatinine Equation (2021) ?  ?  ?  ?  ?  Passed - Last BP in normal range  ?  BP Readings from Last 1 Encounters:  ?11/09/21 121/79  ?  ?  ?  ?  Passed - Valid encounter within last 6 months  ?  Recent Outpatient Visits   ? ?      ? 3 months ago Medicare annual wellness visit, subsequent  ? [facility] Gwyneth Sprout, FNP  ? 10 months ago Status post mitral valve replacement  ? [facility] [place], Dionne Bucy, MD  ? 1 year ago Allergic conjunctivitis of right eye  ? [facility] [place], Dionne Bucy, MD  ? 1 year ago Encounter for annual wellness visit (AWV) in Medicare patient  ? [facility] [place], Dionne Bucy, MD  ? 1 year ago Muscle spasm  ? [facility] [place], [name] M, [place]  ? ?  ?  ? ?  ?  ?  ? ?

## 2021-11-09 NOTE — Addendum Note (Signed)
Addended by: Verlene Mayer A on: 11/09/2021 10:27 AM ? ? Modules accepted: Orders ? ?

## 2021-11-09 NOTE — Addendum Note (Signed)
Addended by: Verlene Mayer A on: 11/09/2021 10:28 AM ? ? Modules accepted: Orders ? ?

## 2021-11-09 NOTE — Telephone Encounter (Signed)
Patient called, left VM to return the call to the office to scheduled an appt for medication refill request.   

## 2021-11-09 NOTE — Progress Notes (Signed)
11/09/2021 9:55 AM   Vanessa Carlson 03-Jan-1961 [id number]  Referring provider: Virginia Crews, MD [address] [place],  [place] [address]  Chief Complaint  Patient presents with   Cysto    HPI: I was consulted to assess the patient's urinary incontinence.  She leaks with coughing sneezing bending lifting.  She is urge incontinence.  Both are significant.  She wears 1 or 2 pads a day that are moderately wet.  No bedwetting.  She voids every 1 or 2 hours gets up twice at night with a good flow  She has had a hysterectomy.    Patient has mild mixed incontinence.  She has frequency and nocturia.  Reassess in 6 weeks on Myrbetriq 50 mg samples and prescription for cystoscopy.  May need urodynamics in the future.  She understood the concept of the test but I did not go over detail  Today Frequency stable.  Last culture positive. Mild improvement on Myrbetriq.  Clinically not infected today On pelvic examination patient had mild grade 2 hypermobility the bladder neck negative cough test no prolapse Cystoscopy: Patient underwent possible cystoscopy.  Bladder mucosa and trigone were normal.  No stitch or foreign body or carcinoma.  Urine was a little bit cloudy and I sent it for culture and will call if positive    PMH: Past Medical History:  Diagnosis Date   AICD (automatic cardioverter/defibrillator) present    Anterior myocardial infarction ([place])    a. 10/2014 - occluded LAD, complicated by cardiogenic shock-->Med Rx as interventional team was unable to open LAD.   Cancer of upper lobe of right lung ([place]) 09/2017   radiation therapy right side   Cervical cancer ([place])    in remission for ~20 yrs, s/p hysterectomy   Chronic combined systolic and diastolic CHF (congestive heart failure) ([place])    a. 10/2015 Echo: EF 20-25%, sev diast dysfxn, mildly reduced RV fxn. Nl MV prosthesis; b. 12/2017 Echo: EF 20-25%, diff HK. Sev ant/antsept HK, apical AK. Mild MS (mean  graad 44mmHg). Mod TR. PASP 68mmHg.   Coronary artery disease    a. 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   Essential hypertension    Ischemic cardiomyopathy    a. 03/2015 s/p MDT single lead AICD;  b. 10/2015 Echo: EF 20-25%;  c. 12/2017 Echo: EF 20-25%, diff HK.   Mass of upper lobe of right lung    a. noted on CXR & CT 08/2017 w/ abnl PET CT.   Mitral valve disease    a. 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 12/2017 Echo: Mild MS. Mean gradient 59mmHg.   Personal history of radiation therapy    f/u lung ca    Surgical History: Past Surgical History:  Procedure Laterality Date   APPENDECTOMY  1998   BREAST EXCISIONAL BIOPSY Left 80s   benign   CARDIAC CATHETERIZATION     CARDIAC DEFIBRILLATOR PLACEMENT     COLONOSCOPY WITH PROPOFOL N/A 01/11/2020   Procedure: COLONOSCOPY WITH PROPOFOL;  Surgeon: Jonathon Bellows, MD;  Location: [facility] END[facility];  Service: Gastroenterology;  Laterality: N/A;   RADICAL HYSTERECTOMY  1998   SHOULDER SURGERY Bilateral    rotator cuff tears   VALVE REPLACEMENT     Mitral valve; 27 mm St. Jude bileaflet valve    Home Medications:  Allergies as of 11/09/2021   No Known Allergies  Medication List        Accurate as of November 09, 2021  9:55 AM. If you have any questions, ask your nurse or doctor.          aspirin 81 MG chewable tablet Chew 81 mg by mouth daily.   atorvastatin 80 MG tablet Commonly known as: LIPITOR TAKE 1 TABLET BY MOUTH ONCE DAILY. OFFICE VISIT AND LABS NEEDED FOR FURTHER REFILLS.   ciprofloxacin 250 MG tablet Commonly known as: Cipro Take 1 tablet (250 mg total) by mouth 2 (two) times daily.   cyclobenzaprine 5 MG tablet Commonly known as: FLEXERIL Take 1 tablet (5 mg total) by mouth 3 (three) times daily as needed for muscle spasms.   Entresto 24-26 MG Generic drug:  sacubitril-valsartan Take 1 tablet by mouth twice daily   ezetimibe 10 MG tablet Commonly known as: ZETIA Take 1 tablet (10 mg total) by mouth daily.   fluticasone 50 MCG/ACT nasal spray Commonly known as: FLONASE Place 2 sprays into both nostrils as needed for allergies or rhinitis.   furosemide 40 MG tablet Commonly known as: LASIX Take 2 tablets by mouth twice daily   gabapentin 300 MG capsule Commonly known as: NEURONTIN Take 1 capsule (300 mg total) by mouth 2 (two) times daily.   metoprolol succinate 25 MG 24 hr tablet Commonly known as: TOPROL-XL Take 1 tablet (25 mg total) by mouth in the morning and at bedtime.   mirabegron ER 50 MG Tb24 tablet Commonly known as: MYRBETRIQ Take 1 tablet (50 mg total) by mouth daily.   nitroGLYCERIN 0.3 MG SL tablet Commonly known as: NITROSTAT Place 1 tablet (0.3 mg total) under the tongue every 5 (five) minutes as needed for chest pain.   ondansetron 4 MG tablet Commonly known as: ZOFRAN Take 1 tablet (4 mg total) by mouth every 8 (eight) hours as needed for nausea or vomiting.   spironolactone 25 MG tablet Commonly known as: ALDACTONE TAKE 1 TABLET BY MOUTH ONCE DAILY. APPOINTMENT NEEDED FOR FURTHER REFILLS.   traMADol 50 MG tablet Commonly known as: ULTRAM TAKE 1 TABLET BY MOUTH EVERY 6 HOURS AS NEEDED   traZODone 50 MG tablet Commonly known as: DESYREL Take 0.5-1 tablets (25-50 mg total) by mouth at bedtime as needed for sleep.   warfarin 10 MG tablet Commonly known as: COUMADIN Take as directed by the anticoagulation clinic. If you are unsure how to take this medication, talk to your nurse or doctor. Original instructions: Take 5 mg by mouth daily. Takes on Saturdays   warfarin 7.5 MG tablet Commonly known as: COUMADIN Take as directed by the anticoagulation clinic. If you are unsure how to take this medication, talk to your nurse or doctor. Original instructions: TAKE 1 TABLET BY MOUTH ONCE DAILY AT 4 PM. TAKE 1  TABLET BY MOUTH DAILY EXCEPT SATURDAYS        Allergies: No Known Allergies  Family History: Family History  Problem Relation Age of Onset   Heart attack Mother    Hypertension Mother    Diabetes Mother    Emphysema Father        smoker   Diabetes Maternal Grandmother    Kidney disease Maternal Grandmother    Breast cancer Maternal Aunt    Breast cancer Maternal Aunt    Colon cancer Maternal Uncle     Social History:  reports that she quit smoking about 25 years ago. Her smoking use included cigarettes. She has a 4.00 pack-year smoking history. She has never  used smokeless tobacco. She reports that she does not drink alcohol and does not use drugs.  ROS:                                        Physical Exam: LMP  (LMP Unknown)   Constitutional:  Alert and oriented, No acute distress. HEENT: [place] AT, moist mucus membranes.  Trachea midline, no masses.  Laboratory Data: Lab Results  Component Value Date   WBC 5.5 10/02/2021   HGB 12.7 10/02/2021   HCT 38.9 10/02/2021   MCV 88.8 10/02/2021   PLT 186 10/02/2021    Lab Results  Component Value Date   CREATININE 1.12 (H) 10/02/2021    No results found for: PSA  No results found for: TESTOSTERONE  Lab Results  Component Value Date   HGBA1C 5.9 (H) 12/14/2019    Urinalysis    Component Value Date/Time   COLORURINE YELLOW (A) 05/27/2021 0005   APPEARANCEUR Clear 09/21/2021 1417   LABSPEC 1.014 05/27/2021 0005   PHURINE 5.0 05/27/2021 0005   GLUCOSEU Negative 09/21/2021 1417   HGBUR MODERATE (A) 05/27/2021 0005   BILIRUBINUR Negative 09/21/2021 1417   KETONESUR NEGATIVE 05/27/2021 0005   PROTEINUR Negative 09/21/2021 1417   PROTEINUR 30 (A) 05/27/2021 0005   NITRITE Negative 09/21/2021 1417   NITRITE POSITIVE (A) 05/27/2021 0005   LEUKOCYTESUR Negative 09/21/2021 1417   LEUKOCYTESUR MODERATE (A) 05/27/2021 0005    Pertinent Imaging:   Assessment & Plan: Patient has mild mixed  incontinence.  Reassess in 5 weeks on Gemtesa samples.  If patient still leaking order urodynamics and I will explain the test in more detail but was mention today.  Call if culture positive.  If culture positive consider suppression therapy at least for 6 months  1. Mixed incontinence   2. Overactive bladder  - Urinalysis, Complete   No follow-ups on file.  Reece Packer, MD  [facility] [address] [place], [place] [address] [phone]

## 2021-11-11 ENCOUNTER — Ambulatory Visit (INDEPENDENT_AMBULATORY_CARE_PROVIDER_SITE_OTHER): Payer: Medicare HMO

## 2021-11-11 ENCOUNTER — Other Ambulatory Visit: Payer: Self-pay

## 2021-11-11 DIAGNOSIS — I480 Paroxysmal atrial fibrillation: Secondary | ICD-10-CM | POA: Diagnosis not present

## 2021-11-11 DIAGNOSIS — Z952 Presence of prosthetic heart valve: Secondary | ICD-10-CM | POA: Diagnosis not present

## 2021-11-11 LAB — POCT INR
INR: 2.8 (ref 2.0–3.0)
PT: 34

## 2021-11-11 NOTE — Patient Instructions (Signed)
Description   ?Continue 5mg  M W F then 7.5mg  daily. F/U in 4 wks. ?  ?  ?

## 2021-11-12 ENCOUNTER — Other Ambulatory Visit: Payer: Self-pay | Admitting: Family Medicine

## 2021-11-12 LAB — CULTURE, URINE COMPREHENSIVE

## 2021-11-12 NOTE — Telephone Encounter (Signed)
Requested medication (s) are due for refill today: no ? ?Requested medication (s) are on the active medication list: yes ? ?Last refill:  11/09/21 #30/0 ? ?Future visit scheduled: no ? ?Notes to clinic:  Unable to refill per protocol, cannot delegate. ?  ?Requested Prescriptions  ?Pending Prescriptions Disp Refills  ? traMADol (ULTRAM) 50 MG tablet [Pharmacy Med Name: traMADol HCl 50 MG Oral Tablet] 30 tablet 0  ?  Sig: TAKE 1 TABLET BY MOUTH EVERY 6 HOURS AS NEEDED.  ?  ? Not Delegated - Analgesics:  Opioid Agonists Failed - 11/12/2021 11:44 AM  ?  ?  Failed - This refill cannot be delegated  ?  ?  Failed - Urine Drug Screen completed in last 360 days  ?  ?  Passed - Valid encounter within last 3 months  ?  Recent Outpatient Visits   ? ?      ? 3 months ago Medicare annual wellness visit, subsequent  ? [facility] Gwyneth Sprout, FNP  ? 10 months ago Status post mitral valve replacement  ? [facility] [place], Dionne Bucy, MD  ? 1 year ago Allergic conjunctivitis of right eye  ? [facility] [place], Dionne Bucy, MD  ? 1 year ago Encounter for annual wellness visit (AWV) in Medicare patient  ? [facility] [place], Dionne Bucy, MD  ? 1 year ago Muscle spasm  ? [facility] [place], [name] M, [place]  ? ?  ?  ? ?  ?  ?  ? ?

## 2021-11-16 ENCOUNTER — Ambulatory Visit (INDEPENDENT_AMBULATORY_CARE_PROVIDER_SITE_OTHER): Payer: Medicare HMO

## 2021-11-16 DIAGNOSIS — I5022 Chronic systolic (congestive) heart failure: Secondary | ICD-10-CM

## 2021-11-16 DIAGNOSIS — Z95810 Presence of automatic (implantable) cardiac defibrillator: Secondary | ICD-10-CM

## 2021-11-17 ENCOUNTER — Telehealth: Payer: Self-pay | Admitting: Internal Medicine

## 2021-11-17 NOTE — Telephone Encounter (Signed)
Duplicate request. I have faxed Vanessa Carlson's note. ?

## 2021-11-17 NOTE — Telephone Encounter (Signed)
   Pre-operative Risk Assessment    Patient Name: Vanessa Carlson  DOB: Jun 23, 1961 MRN: [id number]{ HEARTCARE STAFF-IMPORTANT INSTRUCTIONS 1 Red and Blue Text will auto delete once note is signed or closed. 2 Press F2 to navigate through template.   3 On drop down lists, L click to select >> R click to activate next field 4 Reason for Visit format is IMPORTANT!!  See Directions on No. 2 below. 5 Please review chart to determine if there is already a clearance note open for this procedure!!  DO NOT duplicate if a note already exists!!         Request for Surgical Clearance 1. What type of surgery is being performed? Enter name of procedure below and number of teeth if dental extraction.    Procedure:   Right Total Knee Arthroplasty  2. When is this surgery scheduled? Press F2 to enter date below and place date in Reason for Visit (see directions below). Date of Surgery:  Clearance 12/10/21                              For convenience, highlight and copy (CTL+C) the Clearance MM/DD/YY phrase above. Click here to go to Reason for Visit.  Paste (CTL+V) the date.  Merchandiser, retail.  Then click button underneath called Add Clearance MM/DD/YY as free text.        3. What is the name of the Surgeon, the Surgeon's Group or Practice, phone and fax number?  Press F2 and list below  Surgeon:  Dr. Mike Gip Group or Practice Name:  [facility] Phone number:  [phone] Fax number:  [phone] 4. What type of clearance is requested?  Medical or Cardiac Clearance only?  Pharmacy Clearance Only (Request is to hold medication only)?  Or Both?  Press F2 and select the clearance requested.  If both are needed, select both from the drop down list.      Type of Clearance Requested:   Medical and Pharmacy-Warfarin 5. What type of anesthesia will be used?  Press F2 and select the anesthesia to be used for the procedure.   Type of Anesthesia:  Not Indicated  6. Are there any other  requests or questions from the surgeon?   Not indicated  Additional requests/questions:    Signed, Eli Phillips   11/17/2021, 7:48 AM

## 2021-11-18 NOTE — Progress Notes (Signed)
EPIC Encounter for ICM Monitoring ? ?Patient Name: Vanessa Carlson is a 61 y.o. female ?Date: 11/18/2021 ?Primary Care Physican: Virginia Crews, MD ?Primary Cardiologist: Carlis Abbott, NP HF ?Electrophysiologist: Caryl Comes ?10/05/2021 Office Weight: 228 lbs ?  ?      Spoke with patient and heart failure questions reviewed.  Pt asymptomatic for fluid accumulation.  She has been cooking more lately and thinks maybe eating too much salt. ?  ?Optivol thoracic impedance suggesting possible fluid accumulation starting 3/15.  ?  ?Prescribed:  ?Furosemide 40 mg 2 tablets (80 mg total) by mouth daily.   ?Spironolactone 25 mg take 1 tablet daily ?  ?Labs: ?10/02/2021 Creatinine 1.12, BUN 14, Potassium 4.2, Sodium 137, GFR 56 ?05/29/2021 Creatinine 0.98, BUN 11, Potassium 3.6, Sodium 137, GFR >60 ?05/28/2021 Creatinine 1.11, BUN 15, Potassium 3.5, Sodium 142, GFR 57 ?A complete set of results can be found in Results Review. ?  ?Recommendations:  Recommendation to limit salt intake to 2000 mg daily and fluid intake to 64 oz daily.  Encouraged to call if experiencing any fluid symptoms.  ?  ?Follow-up plan: ICM clinic phone appointment on 11/24/2021 (manual) to recheck fluid levels.   91 day device clinic remote transmission 01/05/2022.     ?  ?EP/Cardiology Office Visits:  Recall 01/07/2021 with Dr. Caryl Comes.  Recall 12/06/2021 with Dr End. ?  ?Copy of ICM check sent to Dr. Caryl Comes.      ? ?3 month ICM trend: 11/16/2021. ? ? ? ?12-14 Month ICM trend:  ? ? ? ?[name], RN ?11/18/2021 ?3:32 PM ? ?

## 2021-11-21 ENCOUNTER — Other Ambulatory Visit: Payer: Self-pay | Admitting: Internal Medicine

## 2021-11-23 NOTE — Telephone Encounter (Signed)
Please schedule 6 month F/U appointment with Dr. Saunders Revel. Thank you! ?

## 2021-11-23 NOTE — Telephone Encounter (Signed)
Attempted to schedule.  

## 2021-11-24 ENCOUNTER — Ambulatory Visit (INDEPENDENT_AMBULATORY_CARE_PROVIDER_SITE_OTHER): Payer: Medicare HMO

## 2021-11-24 ENCOUNTER — Telehealth: Payer: Self-pay

## 2021-11-24 DIAGNOSIS — I5022 Chronic systolic (congestive) heart failure: Secondary | ICD-10-CM

## 2021-11-24 DIAGNOSIS — Z95810 Presence of automatic (implantable) cardiac defibrillator: Secondary | ICD-10-CM

## 2021-11-24 NOTE — Progress Notes (Signed)
EPIC Encounter for ICM Monitoring ? ?Patient Name: Vanessa Carlson is a 61 y.o. female ?Date: 11/24/2021 ?Primary Care Physican: Virginia Crews, MD ?Primary Cardiologist: Carlis Abbott, NP HF ?Electrophysiologist: Caryl Comes ?10/05/2021 Office Weight: 228 lbs ?  ?     Attempted call to patient and unable to reach.  Left message to return call. Transmission reviewed.  ?  ?Optivol thoracic impedance suggesting fluid levels returned to normal.  ?  ?Prescribed:  ?Furosemide 40 mg 2 tablets (80 mg total) by mouth daily.   ?Spironolactone 25 mg take 1 tablet daily ?  ?Labs: ?10/02/2021 Creatinine 1.12, BUN 14, Potassium 4.2, Sodium 137, GFR 56 ?05/29/2021 Creatinine 0.98, BUN 11, Potassium 3.6, Sodium 137, GFR >60 ?05/28/2021 Creatinine 1.11, BUN 15, Potassium 3.5, Sodium 142, GFR 57 ?A complete set of results can be found in Results Review. ?  ?Recommendations:  Unable to reach.   ?  ?Follow-up plan: ICM clinic phone appointment on 12/21/2021.   91 day device clinic remote transmission 01/05/2022.     ?  ?EP/Cardiology Office Visits:  Recall 01/07/2021 with Dr. Caryl Comes.  Recall 12/06/2021 with Dr End. ?  ?Copy of ICM check sent to Dr. Caryl Comes.      ? ?3 month ICM trend: 11/24/2021. ? ? ? ? ?[name], RN ?11/24/2021 ?12:30 PM ? ?

## 2021-11-24 NOTE — Telephone Encounter (Signed)
Remote ICM transmission received.  Attempted call to patient regarding ICM remote transmission and left message per DPR. Advised to return call for any fluid symptoms or questions.  ?

## 2021-11-26 ENCOUNTER — Telehealth: Payer: Self-pay | Admitting: Family Medicine

## 2021-11-26 NOTE — Telephone Encounter (Signed)
Pt is Currently taking an anti coagulant / a clearance was sent for her to stop taking this a certain amount of days before her surgery / pts surgery is 4.13.23/ at [facility] for a right total knee replacement / please advise if received and fax back or call  ?

## 2021-11-27 NOTE — Telephone Encounter (Signed)
Patient schedule for preop. [place] [facility] advised to fax [place] a new form.  ?

## 2021-11-30 ENCOUNTER — Ambulatory Visit: Payer: Medicare HMO | Admitting: Family Medicine

## 2021-11-30 NOTE — Progress Notes (Deleted)
?  ? ? ?  Established patient visit ? ? ?Patient: Vanessa Carlson   DOB: July 26, 1961   61 y.o. Female  MRN: [id number] ?Visit Date: 11/30/2021 ? ?Today's healthcare provider: Lavon Paganini, MD  ? ?No chief complaint on file. ? ?Subjective  ?  ?HPI  ?*** ? ?Medications: ?Outpatient Medications Prior to Visit  ?Medication Sig  ? aspirin 81 MG chewable tablet Chew 81 mg by mouth daily.  ? atorvastatin (LIPITOR) 80 MG tablet TAKE 1 TABLET BY MOUTH ONCE DAILY. OFFICE VISIT AND LABS NEEDED FOR FURTHER REFILLS.  ? cyclobenzaprine (FLEXERIL) 5 MG tablet Take 1 tablet (5 mg total) by mouth 3 (three) times daily as needed for muscle spasms.  ? ezetimibe (ZETIA) 10 MG tablet Take 1 tablet (10 mg total) by mouth daily.  ? fluticasone (FLONASE) 50 MCG/ACT nasal spray Place 2 sprays into both nostrils as needed for allergies or rhinitis.  ? furosemide (LASIX) 40 MG tablet Take 2 tablets by mouth twice daily  ? gabapentin (NEURONTIN) 300 MG capsule Take 1 capsule (300 mg total) by mouth 2 (two) times daily.  ? metoprolol succinate (TOPROL-XL) 25 MG 24 hr tablet Take 1 tablet (25 mg total) by mouth in the morning and at bedtime.  ? nitroGLYCERIN (NITROSTAT) 0.3 MG SL tablet Place 1 tablet (0.3 mg total) under the tongue every 5 (five) minutes as needed for chest pain.  ? ondansetron (ZOFRAN) 4 MG tablet Take 1 tablet (4 mg total) by mouth every 8 (eight) hours as needed for nausea or vomiting.  ? sacubitril-valsartan (ENTRESTO) 24-26 MG Take 1 tablet by mouth 2 (two) times daily. PLEASE SCHEDULE OFFICE VISIT FOR FURTHER REFILLS. THANK YOU!  ? spironolactone (ALDACTONE) 25 MG tablet TAKE 1 TABLET BY MOUTH ONCE DAILY . APPOINTMENT REQUIRED FOR FUTURE REFILLS.  ? traMADol (ULTRAM) 50 MG tablet TAKE 1 TABLET BY MOUTH EVERY 6 HOURS AS NEEDED.  ? traZODone (DESYREL) 50 MG tablet Take 0.5-1 tablets (25-50 mg total) by mouth at bedtime as needed for sleep.  ? Vibegron (GEMTESA) 75 MG TABS Take 75 mg by mouth daily.  ? warfarin (COUMADIN)  10 MG tablet Take 5 mg by mouth daily. Takes on Saturdays  ? warfarin (COUMADIN) 7.5 MG tablet TAKE 1 TABLET BY MOUTH ONCE DAILY AT 4 PM. TAKE 1 TABLET BY MOUTH DAILY EXCEPT SATURDAYS  ? ?No facility-administered medications prior to visit.  ? ? ?Review of Systems ? ?{Labs  Heme  Chem  Endocrine  Serology  Results Review (optional):23779} ?  Objective  ?  ?LMP  (LMP Unknown)  ?{Show previous vital signs (optional):23777} ? ?Physical Exam  ?*** ? ?No results found for any visits on 11/30/21. ? Assessment & Plan  ?  ? ?*** ? ?No follow-ups on file.  ?   ? ?{provider attestation***:1} ? ? ?Lavon Paganini, MD  ?[facility] ?[phone] (phone) ?[phone] (fax) ? ?[facility] ?

## 2021-12-02 NOTE — Telephone Encounter (Signed)
Margreta Journey called for a status update, please advise  ?

## 2021-12-03 ENCOUNTER — Telehealth: Payer: Self-pay

## 2021-12-03 DIAGNOSIS — I2510 Atherosclerotic heart disease of native coronary artery without angina pectoris: Secondary | ICD-10-CM | POA: Diagnosis not present

## 2021-12-03 NOTE — Telephone Encounter (Signed)
Left vm for Vanessa Carlson advising that patient N/S 11/30/21 and that she rescheduled for 12/04/21. [phone]. ?

## 2021-12-03 NOTE — Telephone Encounter (Signed)
Copied from [place] #[id number]. Topic: General - Other ?>> Dec 03, 2021 11:33 AM Valere Dross wrote: ?Reason for CRM: Pt called in wanting to speak with PCP nurse, she mentioned medication warfarin (COUMADIN) and stated she has a surgery tomorrow and needs to speak with her about it. Please advise. ?

## 2021-12-03 NOTE — Telephone Encounter (Signed)
Form says surgery was on 4/13. She has an appt tomorrow with me. We will be discussing her coumadin and possible lovenox bridge for surgery planning at that time.

## 2021-12-04 ENCOUNTER — Encounter: Payer: Self-pay | Admitting: Family Medicine

## 2021-12-04 ENCOUNTER — Ambulatory Visit (INDEPENDENT_AMBULATORY_CARE_PROVIDER_SITE_OTHER): Payer: Medicare HMO | Admitting: Family Medicine

## 2021-12-04 VITALS — BP 107/71 | HR 78 | Temp 98.0°F | Resp 16 | Wt 227.0 lb

## 2021-12-04 DIAGNOSIS — I480 Paroxysmal atrial fibrillation: Secondary | ICD-10-CM

## 2021-12-04 DIAGNOSIS — Z952 Presence of prosthetic heart valve: Secondary | ICD-10-CM

## 2021-12-04 DIAGNOSIS — Z7901 Long term (current) use of anticoagulants: Secondary | ICD-10-CM

## 2021-12-04 MED ORDER — ENOXAPARIN SODIUM 100 MG/ML IJ SOSY: 100.0000 mg | PREFILLED_SYRINGE | Freq: Two times a day (BID) | INTRAMUSCULAR | 0 refills | Status: DC

## 2021-12-04 NOTE — Telephone Encounter (Signed)
Form faxed today

## 2021-12-04 NOTE — Progress Notes (Signed)
?  ? ?I,Sulibeya S Dimas,acting as a scribe for Lavon Paganini, MD.,have documented all relevant documentation on the behalf of Lavon Paganini, MD,as directed by  Lavon Paganini, MD while in the presence of Lavon Paganini, MD. ? ? ?Established patient visit ? ? ?Patient: Vanessa Carlson   DOB: 04-04-1961   61 y.o. Female  MRN: [id number] ?Visit Date: 12/04/2021 ? ?Today's healthcare provider: Lavon Paganini, MD  ? ?Chief Complaint  ?Patient presents with  ? Follow-up  ? ?Subjective  ?  ?HPI  ?Follow up for paroxysmal atrial fibrillation and Mechanical Mitral Valve replacement ? ?The patient was last seen for this 3 weeks ago. ?Changes made at last visit include 7.5 mg (7.5 mg x 1) every Sun, Tue, Thu; 5 mg (10 mg x 0.5) all other days. Last INR 2.8   ?INR goal 2.5-3.5 ? ?She reports excellent compliance with treatment. ?She feels that condition is Unchanged. ?She is not having side effects.  ? ?Patient scheduled for right knee surgery on 12/10/21 with Dr. Kurtis Bushman. ? ?----------------------------------------------------------------------------------------- ?Medications: ?Outpatient Medications Prior to Visit  ?Medication Sig  ? aspirin 81 MG chewable tablet Chew 81 mg by mouth daily.  ? atorvastatin (LIPITOR) 80 MG tablet TAKE 1 TABLET BY MOUTH ONCE DAILY. OFFICE VISIT AND LABS NEEDED FOR FURTHER REFILLS.  ? cyclobenzaprine (FLEXERIL) 5 MG tablet Take 1 tablet (5 mg total) by mouth 3 (three) times daily as needed for muscle spasms.  ? ezetimibe (ZETIA) 10 MG tablet Take 1 tablet (10 mg total) by mouth daily.  ? fluticasone (FLONASE) 50 MCG/ACT nasal spray Place 2 sprays into both nostrils as needed for allergies or rhinitis.  ? furosemide (LASIX) 40 MG tablet Take 2 tablets by mouth twice daily  ? gabapentin (NEURONTIN) 300 MG capsule Take 1 capsule (300 mg total) by mouth 2 (two) times daily.  ? metoprolol succinate (TOPROL-XL) 25 MG 24 hr tablet Take 1 tablet (25 mg total) by mouth in the  morning and at bedtime.  ? nitroGLYCERIN (NITROSTAT) 0.3 MG SL tablet Place 1 tablet (0.3 mg total) under the tongue every 5 (five) minutes as needed for chest pain.  ? ondansetron (ZOFRAN) 4 MG tablet Take 1 tablet (4 mg total) by mouth every 8 (eight) hours as needed for nausea or vomiting.  ? sacubitril-valsartan (ENTRESTO) 24-26 MG Take 1 tablet by mouth 2 (two) times daily. PLEASE SCHEDULE OFFICE VISIT FOR FURTHER REFILLS. THANK YOU!  ? spironolactone (ALDACTONE) 25 MG tablet TAKE 1 TABLET BY MOUTH ONCE DAILY . APPOINTMENT REQUIRED FOR FUTURE REFILLS.  ? traMADol (ULTRAM) 50 MG tablet TAKE 1 TABLET BY MOUTH EVERY 6 HOURS AS NEEDED.  ? traZODone (DESYREL) 50 MG tablet Take 0.5-1 tablets (25-50 mg total) by mouth at bedtime as needed for sleep.  ? Vibegron (GEMTESA) 75 MG TABS Take 75 mg by mouth daily.  ? warfarin (COUMADIN) 10 MG tablet Take 5 mg by mouth daily. Takes on Saturdays  ? warfarin (COUMADIN) 7.5 MG tablet TAKE 1 TABLET BY MOUTH ONCE DAILY AT 4 PM. TAKE 1 TABLET BY MOUTH DAILY EXCEPT SATURDAYS  ? ?No facility-administered medications prior to visit.  ? ? ?Review of Systems  ?Constitutional:  Negative for appetite change and fatigue.  ?Respiratory:  Positive for shortness of breath. Negative for chest tightness and wheezing.   ?Cardiovascular:  Positive for palpitations. Negative for chest pain and leg swelling.  ? ? ?  Objective  ?  ?BP 107/71 (BP Location: Left Arm, Patient Position: Sitting, Cuff Size:  Large)   Pulse 78   Temp 98 ?F (36.7 ?C) (Temporal)   Resp 16   Wt 227 lb (103 kg)   LMP  (LMP Unknown)   SpO2 97%   BMI 41.52 kg/m?  ? ? ?Physical Exam ?Vitals reviewed.  ?Constitutional:   ?   General: She is not in acute distress. ?   Appearance: She is well-developed.  ?HENT:  ?   Head: Normocephalic and atraumatic.  ?Eyes:  ?   General: No scleral icterus. ?   Conjunctiva/sclera: Conjunctivae normal.  ?Cardiovascular:  ?   Rate and Rhythm: Normal rate and regular rhythm.  ?Pulmonary:  ?    Effort: Pulmonary effort is normal. No respiratory distress.  ?Skin: ?   General: Skin is warm and dry.  ?   Findings: No rash.  ?Neurological:  ?   Mental Status: She is alert and oriented to person, place, and time.  ?Psychiatric:     ?   Behavior: Behavior normal.  ?  ? ? ?No results found for any visits on 12/04/21. ? Assessment & Plan  ?  ? ?Problem List Items Addressed This Visit   ? ?  ? Cardiovascular and Mediastinum  ? Paroxysmal atrial fibrillation (HCC)  ? Relevant Medications  ? enoxaparin (LOVENOX) 100 MG/ML injection  ?  ? Other  ? H/O mitral valve replacement with mechanical valve - Primary  ? Chronic anticoagulation  ?  ? ?Patient on chronic warfarin due to mitral valve replacement with mechanical valve.  She also has PAF.  She has gotten surgical clearance from cardiology for her TKA already, but needed management of her anticoags from [place].  Her INR is slightly elevated today. Will stop coumadin today (6 days pre-op) and start lovenox bridge with 1mg /kg BID. Stop lovenox 24 hours pre-op and resume Warfain and Lovenox 24 hours post-op. Recheck INR 4/19 (6 days post-op) and stop lovenox bridge if therapeutic (2.5-3.5). ? ?Return in about 2 months (around 02/03/2022) for chronic disease f/u.  ?   ?Total time spent on today's visit was greater than 30 minutes, including both face-to-face time and nonface-to-face time personally spent on review of chart (labs and imaging), discussing medications and anticoag goals, treatment options, answering patient's questions, and coordinating care.  ? ?I, Lavon Paganini, MD, have reviewed all documentation for this visit. The documentation on 12/04/21 for the exam, diagnosis, procedures, and orders are all accurate and complete. ? ? ?Virginia Crews, MD, MPH ?[place] ?[facility]   ?

## 2021-12-05 DIAGNOSIS — M1711 Unilateral primary osteoarthritis, right knee: Secondary | ICD-10-CM | POA: Diagnosis not present

## 2021-12-06 ENCOUNTER — Other Ambulatory Visit: Payer: Self-pay | Admitting: Internal Medicine

## 2021-12-07 NOTE — Telephone Encounter (Signed)
Please contact pt for future appointment. ?Pt due for 6 month f/u. ?Pt needing refills. ?

## 2021-12-08 ENCOUNTER — Encounter: Payer: Self-pay | Admitting: Internal Medicine

## 2021-12-09 ENCOUNTER — Ambulatory Visit: Payer: Medicare HMO

## 2021-12-10 DIAGNOSIS — I480 Paroxysmal atrial fibrillation: Secondary | ICD-10-CM | POA: Diagnosis not present

## 2021-12-10 DIAGNOSIS — Z95810 Presence of automatic (implantable) cardiac defibrillator: Secondary | ICD-10-CM | POA: Diagnosis not present

## 2021-12-10 DIAGNOSIS — I2510 Atherosclerotic heart disease of native coronary artery without angina pectoris: Secondary | ICD-10-CM | POA: Diagnosis not present

## 2021-12-10 DIAGNOSIS — Z7982 Long term (current) use of aspirin: Secondary | ICD-10-CM | POA: Diagnosis not present

## 2021-12-10 DIAGNOSIS — I10 Essential (primary) hypertension: Secondary | ICD-10-CM | POA: Diagnosis not present

## 2021-12-10 DIAGNOSIS — G8918 Other acute postprocedural pain: Secondary | ICD-10-CM | POA: Diagnosis not present

## 2021-12-10 DIAGNOSIS — M25561 Pain in right knee: Secondary | ICD-10-CM | POA: Diagnosis not present

## 2021-12-10 DIAGNOSIS — E785 Hyperlipidemia, unspecified: Secondary | ICD-10-CM | POA: Diagnosis not present

## 2021-12-10 DIAGNOSIS — M1711 Unilateral primary osteoarthritis, right knee: Secondary | ICD-10-CM | POA: Diagnosis not present

## 2021-12-10 DIAGNOSIS — Z7901 Long term (current) use of anticoagulants: Secondary | ICD-10-CM | POA: Diagnosis not present

## 2021-12-10 HISTORY — PX: REPLACEMENT TOTAL KNEE: SUR1224

## 2021-12-11 DIAGNOSIS — Z7982 Long term (current) use of aspirin: Secondary | ICD-10-CM | POA: Diagnosis not present

## 2021-12-11 DIAGNOSIS — M1711 Unilateral primary osteoarthritis, right knee: Secondary | ICD-10-CM | POA: Diagnosis not present

## 2021-12-11 DIAGNOSIS — I10 Essential (primary) hypertension: Secondary | ICD-10-CM | POA: Diagnosis not present

## 2021-12-11 DIAGNOSIS — Z95810 Presence of automatic (implantable) cardiac defibrillator: Secondary | ICD-10-CM | POA: Diagnosis not present

## 2021-12-11 DIAGNOSIS — Z7901 Long term (current) use of anticoagulants: Secondary | ICD-10-CM | POA: Diagnosis not present

## 2021-12-11 DIAGNOSIS — E785 Hyperlipidemia, unspecified: Secondary | ICD-10-CM | POA: Diagnosis not present

## 2021-12-11 DIAGNOSIS — Z96659 Presence of unspecified artificial knee joint: Secondary | ICD-10-CM | POA: Insufficient documentation

## 2021-12-11 DIAGNOSIS — I2510 Atherosclerotic heart disease of native coronary artery without angina pectoris: Secondary | ICD-10-CM | POA: Diagnosis not present

## 2021-12-12 ENCOUNTER — Other Ambulatory Visit: Payer: Self-pay | Admitting: Internal Medicine

## 2021-12-14 ENCOUNTER — Other Ambulatory Visit: Payer: Self-pay | Admitting: Internal Medicine

## 2021-12-14 ENCOUNTER — Other Ambulatory Visit: Payer: Self-pay | Admitting: Family Medicine

## 2021-12-14 NOTE — Telephone Encounter (Signed)
Requested medications are due for refill today.  Provider to determine ? ?Requested medications are on the active medications list.  yes ? ?Last refill. 11/12/2021 #30 0 refills ? ?Future visit scheduled.   yes ? ?Notes to clinic.  Medication refill is not delegated. ? ? ? ?Requested Prescriptions  ?Pending Prescriptions Disp Refills  ? traMADol (ULTRAM) 50 MG tablet [Pharmacy Med Name: traMADol HCl 50 MG Oral Tablet] 30 tablet 0  ?  Sig: TAKE 1 TABLET BY MOUTH EVERY 6 HOURS AS NEEDED.  ?  ? Not Delegated - Analgesics:  Opioid Agonists Failed - 12/14/2021 11:07 AM  ?  ?  Failed - This refill cannot be delegated  ?  ?  Failed - Urine Drug Screen completed in last 360 days  ?  ?  Passed - Valid encounter within last 3 months  ?  Recent Outpatient Visits   ? ?      ? 1 week ago H/O mitral valve replacement with mechanical valve  ? [place], MD  ? 4 months ago Medicare annual wellness visit, subsequent  ? [facility] Gwyneth Sprout, FNP  ? 11 months ago Status post mitral valve replacement  ? [facility], Dionne Bucy, MD  ? 1 year ago Allergic conjunctivitis of right eye  ? [facility], Dionne Bucy, MD  ? 2 years ago Encounter for annual wellness visit (AWV) in Medicare patient  ? [facility] Bac[place]al[place], Dionne Bucy, MD  ? ?  ?  ?Future Appointments   ? ?        ? In 3 weeks Dunn, Areta Haber, PA-C [facility]  ? In 1 month Bacigalupo, Dionne Bucy, MD [facility], [facility]  ? ?  ? ? ?  ?  ?  ?  ?

## 2021-12-15 ENCOUNTER — Encounter: Payer: Self-pay | Admitting: Internal Medicine

## 2021-12-15 ENCOUNTER — Other Ambulatory Visit: Payer: Self-pay | Admitting: Family Medicine

## 2021-12-15 ENCOUNTER — Encounter: Payer: Self-pay | Admitting: Family Medicine

## 2021-12-15 ENCOUNTER — Other Ambulatory Visit: Payer: Self-pay | Admitting: Internal Medicine

## 2021-12-16 ENCOUNTER — Ambulatory Visit (INDEPENDENT_AMBULATORY_CARE_PROVIDER_SITE_OTHER): Payer: Medicare HMO

## 2021-12-16 DIAGNOSIS — I480 Paroxysmal atrial fibrillation: Secondary | ICD-10-CM | POA: Diagnosis not present

## 2021-12-16 DIAGNOSIS — Z952 Presence of prosthetic heart valve: Secondary | ICD-10-CM | POA: Diagnosis not present

## 2021-12-16 LAB — POCT INR
INR: 1.5 — AB (ref 2.0–3.0)
PT: 17.8

## 2021-12-16 NOTE — Patient Instructions (Signed)
Description   ?Continue 5mg  M W F then 7.5mg  daily. F/U in 2 wks. ?  ?  ?

## 2021-12-17 DIAGNOSIS — Z96651 Presence of right artificial knee joint: Secondary | ICD-10-CM | POA: Diagnosis not present

## 2021-12-21 ENCOUNTER — Ambulatory Visit (INDEPENDENT_AMBULATORY_CARE_PROVIDER_SITE_OTHER): Payer: Medicare HMO | Admitting: Urology

## 2021-12-21 ENCOUNTER — Encounter: Payer: Self-pay | Admitting: Urology

## 2021-12-21 ENCOUNTER — Ambulatory Visit (INDEPENDENT_AMBULATORY_CARE_PROVIDER_SITE_OTHER): Payer: Medicare HMO

## 2021-12-21 VITALS — BP 113/68 | HR 98 | Ht 62.0 in | Wt 227.0 lb

## 2021-12-21 DIAGNOSIS — N3946 Mixed incontinence: Secondary | ICD-10-CM

## 2021-12-21 DIAGNOSIS — I5022 Chronic systolic (congestive) heart failure: Secondary | ICD-10-CM

## 2021-12-21 DIAGNOSIS — Z95810 Presence of automatic (implantable) cardiac defibrillator: Secondary | ICD-10-CM

## 2021-12-21 NOTE — Progress Notes (Signed)
? ?12/21/2021 ?10:14 AM  ? ?Vanessa Carlson ?02/09/1961 ?[id number] ? ?Referring provider: Virginia Crews, MD ?[address] ?[address] ?[place],  [place] [address] ? ?Chief Complaint  ?Patient presents with  ? Follow-up  ? ? ?HPI: ?I was consulted to assess the patient's urinary incontinence.  She leaks with coughing sneezing bending lifting.  She is urge incontinence.  Both are significant.  She wears 1 or 2 pads a day that are moderately wet.  No bedwetting. ? ?She voids every 1 or 2 hours gets up twice at night with a good flow ? ?She has had a hysterectomy.   ?  ?Patient has mild mixed incontinence.  She has frequency and nocturia.  Reassess in 6 weeks on Myrbetriq 50 mg samples and prescription for cystoscopy.  May need urodynamics in the future.  She understood the concept of the test but I did not go over detail ?  ?Last culture positive. ?Mild improvement on Myrbetriq.  Clinically not infected today ?On pelvic examination patient had mild grade 2 hypermobility the bladder neck negative cough test no prolapse ?Cystoscopy: Urine was a little bit cloudy and I sent it for culture and will call if positive   ? ?Patient has mild mixed incontinence.  Reassess in 5 weeks on Gemtesa samples.  If patient still leaking order urodynamics and I will explain the test in more detail but was mention today.  Call if culture positive.  If culture positive consider suppression therapy at least for 6 months ? ?Today ?Frequency stable ?Last culture negative. ?Urge incontinence dramatically better.  Less frequency and urgency.  Clinically not infected ?  ? ? ?PMH: ?Past Medical History:  ?Diagnosis Date  ? AICD (automatic cardioverter/defibrillator) present   ? Anterior myocardial infarction [facility])   ? a. 10/2014 - occluded LAD, complicated by cardiogenic shock-->Med Rx as interventional team was unable to open LAD.  ? Cancer of upper lobe of right lung ([place]) 09/2017  ? radiation therapy right side  ? Cervical cancer ([place])   ?  in remission for ~20 yrs, s/p hysterectomy  ? Chronic combined systolic and diastolic CHF (congestive heart failure) ([place])   ? a. 10/2015 Echo: EF 20-25%, sev diast dysfxn, mildly reduced RV fxn. Nl MV prosthesis; b. 12/2017 Echo: EF 20-25%, diff HK. Sev ant/antsept HK, apical AK. Mild MS (mean graad 65mmHg). Mod TR. PASP 65mmHg.  ? Coronary artery disease   ? a. 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  ? Essential hypertension   ? Ischemic cardiomyopathy   ? a. 03/2015 s/p MDT single lead AICD;  b. 10/2015 Echo: EF 20-25%;  c. 12/2017 Echo: EF 20-25%, diff HK.  ? Mass of upper lobe of right lung   ? a. noted on CXR & CT 08/2017 w/ abnl PET CT.  ? Mitral valve disease   ? a. 2006 s/p MVR w/ 60mm SJM bileaflet mechanical valve-->chronic coumadin/ASA; b. 12/2017 Echo: Mild MS. Mean gradient 96mmHg.  ? Personal history of radiation therapy   ? f/u lung ca  ? ? ?Surgical History: ?Past Surgical History:  ?Procedure Laterality Date  ? APPENDECTOMY  1998  ? BREAST EXCISIONAL BIOPSY Left 80s  ? benign  ? CARDIAC CATHETERIZATION    ? CARDIAC DEFIBRILLATOR PLACEMENT    ? COLONOSCOPY WITH PROPOFOL N/A 01/11/2020  ? Procedure: COLONOSCOPY WITH PROPOFOL;  Surgeon: Jonathon Bellows, MD;  [facility] ENDOSCOPY;  Service: Gastroenterology;  Laterality: N/A;  ?  RADICAL HYSTERECTOMY  1998  ? SHOULDER SURGERY Bilateral   ? rotator cuff tears  ? VALVE REPLACEMENT    ? Mitral valve; 27 mm St. Jude bileaflet valve  ? ? ?Home Medications:  ?Allergies as of 12/21/2021   ?No Known Allergies ?  ? ?  ?Medication List  ?  ? ?  ? Accurate as of December 21, 2021 10:14 AM. If you have any questions, ask your nurse or doctor.  ?  ?  ? ?  ? ?aspirin 81 MG chewable tablet ?Chew 81 mg by mouth daily. ?  ?atorvastatin 80 MG tablet ?Commonly known as: LIPITOR ?TAKE 1 TABLET BY MOUTH ONCE DAILY. OFFICE VISIT AND LABS NEEDED FOR FURTHER REFILLS. ?  ?cyclobenzaprine 5 MG  tablet ?Commonly known as: FLEXERIL ?Take 1 tablet (5 mg total) by mouth 3 (three) times daily as needed for muscle spasms. ?  ?enoxaparin 100 MG/ML injection ?Commonly known as: LOVENOX ?Inject 1 mL (100 mg total) into the skin every 12 (twelve) hours. ?  ?Entresto 24-26 MG ?Generic drug: sacubitril-valsartan ?Take 1 tablet by mouth 2 (two) times daily. PLEASE SCHEDULE OFFICE VISIT FOR FURTHER REFILLS. THANK YOU! ?  ?ezetimibe 10 MG tablet ?Commonly known as: ZETIA ?Take 1 tablet (10 mg total) by mouth daily. ?  ?fluticasone 50 MCG/ACT nasal spray ?Commonly known as: FLONASE ?Place 2 sprays into both nostrils as needed for allergies or rhinitis. ?  ?furosemide 40 MG tablet ?Commonly known as: LASIX ?Take 2 tablets (80 mg total) by mouth 2 (two) times daily. PLEASE SCHEDULE OFFICE VISIT FOR FURTHER REFILLS. THANK YOU! ?  ?gabapentin 300 MG capsule ?Commonly known as: NEURONTIN ?Take 1 capsule (300 mg total) by mouth 2 (two) times daily. ?  ?Gemtesa 75 MG Tabs ?Generic drug: Vibegron ?Take 75 mg by mouth daily. ?  ?metoprolol succinate 25 MG 24 hr tablet ?Commonly known as: TOPROL-XL ?TAKE 1 TABLET BY MOUTH IN THE MORNING AND 1 TABLET AT BEDTIME ?  ?nitroGLYCERIN 0.3 MG SL tablet ?Commonly known as: NITROSTAT ?Place 1 tablet (0.3 mg total) under the tongue every 5 (five) minutes as needed for chest pain. ?  ?ondansetron 4 MG tablet ?Commonly known as: ZOFRAN ?Take 1 tablet (4 mg total) by mouth every 8 (eight) hours as needed for nausea or vomiting. ?  ?spironolactone 25 MG tablet ?Commonly known as: ALDACTONE ?TAKE 1 TABLET BY MOUTH ONCE DAILY . APPOINTMENT REQUIRED FOR FUTURE REFILLS. ?  ?traMADol 50 MG tablet ?Commonly known as: ULTRAM ?TAKE 1 TABLET BY MOUTH EVERY 6 HOURS AS NEEDED ?  ?traZODone 50 MG tablet ?Commonly known as: DESYREL ?Take 0.5-1 tablets (25-50 mg total) by mouth at bedtime as needed for sleep. ?  ?warfarin 10 MG tablet ?Commonly known as: COUMADIN ?Take as directed by the anticoagulation  clinic. If you are unsure how to take this medication, talk to your nurse or doctor. ?Original instructions: Take 5 mg by mouth daily. Takes on Saturdays ?  ?warfarin 7.5 MG tablet ?Commonly known as: COUMADIN ?Take as directed by the anticoagulation clinic. If you are unsure how to take this medication, talk to your nurse or doctor. ?Original instructions: TAKE 1 TABLET BY MOUTH ONCE DAILY AT 4 PM. TAKE 1 TABLET BY MOUTH DAILY EXCEPT SATURDAYS ?  ? ?  ? ? ?Allergies: No Known Allergies ? ?Family History: ?Family History  ?Problem Relation Age of Onset  ? Heart attack Mother   ? Hypertension Mother   ? Diabetes Mother   ? Emphysema Father   ?  smoker  ? Diabetes Maternal Grandmother   ? Kidney disease Maternal Grandmother   ? Breast cancer Maternal Aunt   ? Breast cancer Maternal Aunt   ? Colon cancer Maternal Uncle   ? ? ?Social History:  reports that she quit smoking about 25 years ago. Her smoking use included cigarettes. She has a 4.00 pack-year smoking history. She has never used smokeless tobacco. She reports that she does not drink alcohol and does not use drugs. ? ?ROS: ?  ? ?  ? ?  ? ?  ? ?  ? ?  ? ?  ? ?  ? ?  ? ?  ? ?  ? ?  ? ?  ? ?Physical Exam: ?BP 113/68   Pulse 98   Ht 5\' 2"  (1.575 m)   Wt 103 kg   LMP  (LMP Unknown)   BMI 41.52 kg/m?   ?Constitutional:  Alert and oriented, No acute distress. ? ? ?Laboratory Data: ?Lab Results  ?Component Value Date  ? WBC 5.5 10/02/2021  ? HGB 12.7 10/02/2021  ? HCT 38.9 10/02/2021  ? MCV 88.8 10/02/2021  ? PLT 186 10/02/2021  ? ? ?Lab Results  ?Component Value Date  ? CREATININE 1.12 (H) 10/02/2021  ? ? ?No results found for: PSA ? ?No results found for: TESTOSTERONE ? ?Lab Results  ?Component Value Date  ? HGBA1C 5.9 (H) 12/14/2019  ? ? ?Urinalysis ?   ?Component Value Date/Time  ? COLORURINE YELLOW (A) 05/27/2021 0005  ? APPEARANCEUR Clear 09/21/2021 1417  ? LABSPEC 1.014 05/27/2021 0005  ? PHURINE 5.0 05/27/2021 0005  ? GLUCOSEU Negative 09/21/2021 1417   ? HGBUR MODERATE (A) 05/27/2021 0005  ? BILIRUBINUR Negative 09/21/2021 1417  ? [place] NEGATIVE 05/27/2021 0005  ? PROTEINUR Negative 09/21/2021 1417  ? PROTEINUR 30 (A) 05/27/2021 0005  ? NITRITE Negative 09/21/2021

## 2021-12-22 DIAGNOSIS — Z96651 Presence of right artificial knee joint: Secondary | ICD-10-CM | POA: Diagnosis not present

## 2021-12-23 NOTE — Progress Notes (Signed)
EPIC Encounter for ICM Monitoring ? ?Patient Name: Vanessa Carlson is a 61 y.o. female ?Date: 12/23/2021 ?Primary Care Physican: Virginia Crews, MD ?Primary Cardiologist: Carlis Abbott, NP HF ?Electrophysiologist: Caryl Comes ?10/05/2021 Office Weight: 228 lbs ?  ?      Transmission reviewed.  ?  ?Optivol thoracic impedance suggesting normal fluid levels.  ?  ?Prescribed:  ?Furosemide 40 mg 2 tablets (80 mg total) by mouth daily.   ?Spironolactone 25 mg take 1 tablet daily ?  ?Labs: ?10/02/2021 Creatinine 1.12, BUN 14, Potassium 4.2, Sodium 137, GFR 56 ?05/29/2021 Creatinine 0.98, BUN 11, Potassium 3.6, Sodium 137, GFR >60 ?05/28/2021 Creatinine 1.11, BUN 15, Potassium 3.5, Sodium 142, GFR 57 ?A complete set of results can be found in Results Review. ?  ?Recommendations:  No changes.   ?  ?Follow-up plan: ICM clinic phone appointment on 02/01/2022.   91 day device clinic remote transmission 01/05/2022.     ?  ?EP/Cardiology Office Visits:  Recall 01/07/2021 with Dr. Caryl Comes.  Recall 12/06/2021 with Dr End. ?  ?Copy of ICM check sent to Dr. Caryl Comes.      ? ?3 month ICM trend: 12/21/2021. ? ? ? ?12-14 Month ICM trend:  ? ? ? ?[name], RN ?12/23/2021 ?3:03 PM ? ?

## 2021-12-24 DIAGNOSIS — Z96651 Presence of right artificial knee joint: Secondary | ICD-10-CM | POA: Diagnosis not present

## 2021-12-29 DIAGNOSIS — Z96651 Presence of right artificial knee joint: Secondary | ICD-10-CM | POA: Diagnosis not present

## 2021-12-30 ENCOUNTER — Ambulatory Visit (INDEPENDENT_AMBULATORY_CARE_PROVIDER_SITE_OTHER): Payer: Medicare HMO

## 2021-12-30 ENCOUNTER — Encounter: Payer: Self-pay | Admitting: Family Medicine

## 2021-12-30 DIAGNOSIS — Z952 Presence of prosthetic heart valve: Secondary | ICD-10-CM | POA: Diagnosis not present

## 2021-12-30 DIAGNOSIS — I480 Paroxysmal atrial fibrillation: Secondary | ICD-10-CM

## 2021-12-30 LAB — POCT INR
INR: 1.4 — AB (ref 2.0–3.0)
PT: 16.6

## 2021-12-30 NOTE — Patient Instructions (Signed)
Description   ?5mg  daily except 7.5mg  Sun, Tue, ad Thursday. F/U in 2 wks. ?  ? Per Dr. Ky Barban 12/30/21 ?

## 2022-01-01 ENCOUNTER — Ambulatory Visit: Payer: Medicare HMO

## 2022-01-01 DIAGNOSIS — Z96651 Presence of right artificial knee joint: Secondary | ICD-10-CM | POA: Diagnosis not present

## 2022-01-03 NOTE — Progress Notes (Signed)
? ?Cardiology Office Note   ? ?Date:  01/04/2022  ? ?ID:  Vanessa Carlson, DOB 07-Sep-1960, MRN [id number] ? ?PCP:  Virginia Crews, MD  ?Cardiologist:  [name], MD  ?Electrophysiologist:  None  ? ?Chief Complaint: Follow-up ? ?History of Present Illness:  ? ?Vanessa Carlson is a 61 y.o. 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 ? ?Most recent ischemic evaluation via [place] in 2016 reportedly showed proximal occlusion of the LAD which could not be crossed with a wire.  Most recent echo from 12/2017 showed an EF of 20 to 25% with diffuse hypokinesis and severe hypokinesis of the anterior and anteroseptal myocardium as well as apical akinesis, mechanical mitral valve with a mean gradient of 4 mmHg, mild left atrial enlargement, normal RV systolic function, moderate tricuspid regurgitation, and an estimated PASP of 43 mmHg.  When compared to echo from 2017, her EF was stable.  She was admitted in 04/2021 with E. coli bacteremia secondary to pyelonephritis complicated by sepsis.  She was last seen in the office in 05/2021 and was back to her baseline without decompensation.  Her mild chronic leg edema was stable.  Most recent OptiVol from 12/21/2021 suggestive of normal fluid levels. ? ?She comes in doing well from a cardiac perspective and is without symptoms of angina or decompensation.  No palpitations, dizziness, presyncope, or syncope.  No falls, hematochezia, or melena.  No lower significant extremity swelling, or orthopnea.  She has a follow-up INR scheduled with her PCP later this month.  She is tolerating medications  without issues.  She is aware she needs SBE prophylaxis for all dental work.  She does not have any cardiac issues or concerns at this time. ? ? ?Labs independently reviewed: ?12/30/2021 - INR 1.4 (followed by PCP) ?09/2021 - Hgb 12.7, PLT 186, potassium 4.2, BUN 14, serum creatinine 1.12, albumin 4.3, AST/ALT normal ?03/2021 - TC 99, TG 67, HDL 34, LDL 52  ? ?Past Medical History:  ?Diagnosis Date  ? AICD (automatic cardioverter/defibrillator) present   ? Anterior myocardial infarction [facility])   ? a. 10/2014 - occluded LAD, complicated by cardiogenic shock-->Med Rx as interventional team was unable to open LAD.  ? Cancer of upper lobe of right lung ([place]) 09/2017  ? radiation therapy right side  ? Cervical cancer ([place])   ? in remission for ~20 yrs, s/p hysterectomy  ? Chronic combined systolic and diastolic CHF (congestive heart failure) ([place])   ? a. 10/2015 Echo: EF 20-25%, sev diast dysfxn, mildly reduced RV fxn. Nl MV prosthesis; b. 12/2017 Echo: EF 20-25%, diff HK. Sev ant/antsept HK, apical AK. Mild MS (mean graad 72mmHg). Mod TR. PASP 53mmHg.  ? Coronary artery disease   ? a. 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  ? Essential hypertension   ? Ischemic cardiomyopathy   ? a. 03/2015 s/p MDT single lead AICD;  b. 10/2015 Echo: EF 20-25%;  c. 12/2017 Echo: EF 20-25%, diff HK.  ? Mass of upper lobe of right lung   ? a. noted on CXR & CT 08/2017  w/ abnl PET CT.  ? Mitral valve disease   ? a. 2006 s/p MVR w/ 40mm SJM bileaflet mechanical valve-->chronic coumadin/ASA; b. 12/2017 Echo: Mild MS. Mean gradient 90mmHg.  ? Personal history of radiation therapy   ? f/u lung ca  ? ? ?Past Surgical History:  ?Procedure Laterality Date  ? APPENDECTOMY  1998  ? BREAST EXCISIONAL BIOPSY Left 80s  ? benign  ? CARDIAC CATHETERIZATION    ? CARDIAC DEFIBRILLATOR PLACEMENT    ? COLONOSCOPY WITH PROPOFOL N/A 01/11/2020  ? Procedure: COLONOSCOPY WITH PROPOFOL;   Surgeon: Jonathon Bellows, MD;  [facility] ENDOSCOPY;  Service: Gastroenterology;  Laterality: N/A;  ? RADICAL HYSTERECTOMY  1998  ? REPLACEMENT TOTAL KNEE Right   ? SHOULDER SURGERY Bilateral   ? rotator cuff tears  ? VALVE REPLACEMENT    ? Mitral valve; 27 mm St. Jude bileaflet valve  ? ? ?Current Medications: ?Current Meds  ?Medication Sig  ? aspirin 81 MG chewable tablet Chew 81 mg by mouth daily.  ? atorvastatin (LIPITOR) 80 MG tablet TAKE 1 TABLET BY MOUTH ONCE DAILY. OFFICE VISIT AND LABS NEEDED FOR FURTHER REFILLS.  ? cyclobenzaprine (FLEXERIL) 5 MG tablet Take 1 tablet (5 mg total) by mouth 3 (three) times daily as needed for muscle spasms.  ? enoxaparin (LOVENOX) 100 MG/ML injection Inject 1 mL (100 mg total) into the skin every 12 (twelve) hours.  ? ezetimibe (ZETIA) 10 MG tablet Take 1 tablet (10 mg total) by mouth daily.  ? fluticasone (FLONASE) 50 MCG/ACT nasal spray Place 2 sprays into both nostrils as needed for allergies or rhinitis.  ? furosemide (LASIX) 40 MG tablet Take 2 tablets (80 mg total) by mouth 2 (two) times daily. PLEASE SCHEDULE OFFICE VISIT FOR FURTHER REFILLS. THANK YOU!  ? gabapentin (NEURONTIN) 300 MG capsule Take 1 capsule (300 mg total) by mouth 2 (two) times daily.  ? metoprolol succinate (TOPROL-XL) 25 MG 24 hr tablet TAKE 1 TABLET BY MOUTH IN THE MORNING AND 1 TABLET AT BEDTIME  ? nitroGLYCERIN (NITROSTAT) 0.3 MG SL tablet Place 1 tablet (0.3 mg total) under the tongue every 5 (five) minutes as needed for chest pain.  ? ondansetron (ZOFRAN) 4 MG tablet Take 1 tablet (4 mg total) by mouth every 8 (eight) hours as needed for nausea or vomiting.  ? oxyCODONE (OXY IR/ROXICODONE) 5 MG immediate release tablet Take 5 mg by mouth every 4 (four) hours as needed.  ? sacubitril-valsartan (ENTRESTO) 24-26 MG Take 1 tablet by mouth 2 (two) times daily. PLEASE SCHEDULE OFFICE VISIT FOR FURTHER REFILLS. THANK YOU!  ? spironolactone (ALDACTONE) 25 MG tablet TAKE 1 TABLET BY MOUTH ONCE DAILY .  APPOINTMENT REQUIRED FOR FUTURE REFILLS.  ? traMADol (ULTRAM) 50 MG tablet TAKE 1 TABLET BY MOUTH EVERY 6 HOURS AS NEEDED  ? traZODone (DESYREL) 50 MG tablet Take 0.5-1 tablets (25-50 mg total) by mouth at bedtime as needed for sleep.  ? Vibegron (GEMTESA) 75 MG TABS Take 75 mg by mouth daily.  ? warfarin (COUMADIN) 10 MG tablet Take 5 mg by mouth daily. Takes on Saturdays  ? warfarin (COUMADIN) 7.5 MG tablet TAKE 1 TABLET BY MOUTH ONCE DAILY AT 4 PM. TAKE 1 TABLET BY MOUTH DAILY EXCEPT SATURDAYS  ? ? ?Allergies:   Patient has no known allergies.  ? ?Social History  ? ?Socioeconomic History  ? Marital status: Married  ?  Spouse name: Tosin  ? Number of children: 2  ? Years of education: 53  ? Highest  education level: Some college, no degree  ?Occupational History  ? Occupation: disable  ?Tobacco Use  ? Smoking status: Former  ?  Packs/day: 1.00  ?  Years: 4.00  ?  Pack years: 4.00  ?  Types: Cigarettes  ?  Quit date: 1998  ?  Years since quitting: 25.3  ? Smokeless tobacco: Never  ?Vaping Use  ? Vaping Use: Never used  ?Substance and Sexual Activity  ? Alcohol use: No  ? Drug use: No  ? Sexual activity: Yes  ?  Partners: Male  ?  Birth control/protection: Surgical  ?Other Topics Concern  ? Not on file  ?Social History Narrative  ? Not on file  ? ?Social Determinants of Health  ? ?Financial Resource Strain: Low Risk   ? Difficulty of Paying Living Expenses: Not hard at all  ?Food Insecurity: No Food Insecurity  ? Worried About Charity fundraiser in the Last Year: Never true  ? Ran Out of Food in the Last Year: Never true  ?Transportation Needs: No Transportation Needs  ? Lack of Transportation (Medical): No  ? Lack of Transportation (Non-Medical): No  ?Physical Activity: Insufficiently Active  ? Days of Exercise per Week: 2 days  ? Minutes of Exercise per Session: 20 min  ?Stress: No Stress Concern Present  ? Feeling of Stress : Not at all  ?Social Connections: Moderately Integrated  ? Frequency of Communication  with Friends and Family: Three times a week  ? Frequency of Social Gatherings with Friends and Family: Once a week  ? Attends Religious Services: More than 4 times per year  ? Active Member of Clubs or

## 2022-01-04 ENCOUNTER — Encounter: Payer: Self-pay | Admitting: Physician Assistant

## 2022-01-04 ENCOUNTER — Ambulatory Visit (INDEPENDENT_AMBULATORY_CARE_PROVIDER_SITE_OTHER): Payer: Medicare HMO | Admitting: Physician Assistant

## 2022-01-04 ENCOUNTER — Inpatient Hospital Stay: Payer: Medicare HMO | Admitting: Oncology

## 2022-01-04 ENCOUNTER — Ambulatory Visit: Payer: Medicare HMO | Admitting: Physician Assistant

## 2022-01-04 VITALS — BP 111/55 | HR 80 | Resp 16 | Wt 223.1 lb

## 2022-01-04 VITALS — BP 120/70 | HR 77 | Ht 62.5 in | Wt 223.0 lb

## 2022-01-04 DIAGNOSIS — I2510 Atherosclerotic heart disease of native coronary artery without angina pectoris: Secondary | ICD-10-CM | POA: Diagnosis not present

## 2022-01-04 DIAGNOSIS — I255 Ischemic cardiomyopathy: Secondary | ICD-10-CM

## 2022-01-04 DIAGNOSIS — I480 Paroxysmal atrial fibrillation: Secondary | ICD-10-CM

## 2022-01-04 DIAGNOSIS — Z95810 Presence of automatic (implantable) cardiac defibrillator: Secondary | ICD-10-CM | POA: Diagnosis not present

## 2022-01-04 DIAGNOSIS — Z113 Encounter for screening for infections with a predominantly sexual mode of transmission: Secondary | ICD-10-CM | POA: Diagnosis not present

## 2022-01-04 DIAGNOSIS — Z952 Presence of prosthetic heart valve: Secondary | ICD-10-CM | POA: Diagnosis not present

## 2022-01-04 DIAGNOSIS — I5022 Chronic systolic (congestive) heart failure: Secondary | ICD-10-CM

## 2022-01-04 DIAGNOSIS — I10 Essential (primary) hypertension: Secondary | ICD-10-CM | POA: Diagnosis not present

## 2022-01-04 DIAGNOSIS — Z202 Contact with and (suspected) exposure to infections with a predominantly sexual mode of transmission: Secondary | ICD-10-CM

## 2022-01-04 DIAGNOSIS — E785 Hyperlipidemia, unspecified: Secondary | ICD-10-CM | POA: Diagnosis not present

## 2022-01-04 NOTE — Patient Instructions (Signed)
Medication Instructions:  ?- Your physician recommends that you continue on your current medications as directed. Please refer to the Current Medication list given to you today. ? ?*If you need a refill on your cardiac medications before your next appointment, please call your pharmacy* ? ? ?Lab Work: ?- none ordered ? ?If you have labs (blood work) drawn today and your tests are completely normal, you will receive your results only by: ?MyChart Message (if you have MyChart) OR ?A paper copy in the mail ?If you have any lab test that is abnormal or we need to change your treatment, we will call you to review the results. ? ? ?Testing/Procedures: ? ?1) Echocardiogram: ?- Your physician has requested that you have an echocardiogram. Echocardiography is a painless test that uses sound waves to create images of your heart. It provides your doctor with information about the size and shape of your heart and how well your heart?s chambers and valves are working. This procedure takes approximately one hour. There are no restrictions for this procedure. There is a possibility that an IV may need to be started during your test to inject an image enhancing agent. This is done to obtain more optimal pictures of your heart. Therefore we ask that you do at least drink some water prior to coming in to hydrate your veins.  ? ? ? ?Follow-Up: ?At [facility], you and your health needs are our priority.  As part of our continuing mission to provide you with exceptional heart care, we have created designated Provider Care Teams.  These Care Teams include your primary Cardiologist (physician) and Advanced Practice Providers (APPs -  Physician Assistants and Nurse Practitioners) who all work together to provide you with the care you need, when you need it. ? ?We recommend signing up for the patient portal called "MyChart".  Sign up information is provided on this After Visit Summary.  MyChart is used to connect with patients for  Virtual Visits (Telemedicine).  Patients are able to view lab/test results, encounter notes, upcoming appointments, etc.  Non-urgent messages can be sent to your provider as well.   ?To learn more about what you can do with MyChart, go to [email or web address].   ? ?Your next appointment:   ?6 month(s) ? ?The format for your next appointment:   ?In Person ? ?Provider:   ?You may see [name], MD or one of the following Advanced Practice Providers on your designated Care Team:   ?Christell Faith, PA-C ? ? ? ?Other Instructions ?Echocardiogram ?An echocardiogram is a test that uses sound waves (ultrasound) to produce images of the heart. ?Images from an echocardiogram can provide important information about: ?Heart size and shape. ?The size and thickness and movement of your heart's walls. ?Heart muscle function and strength. ?Heart valve function or if you have stenosis. Stenosis is when the heart valves are too narrow. ?If blood is flowing backward through the heart valves (regurgitation). ?A tumor or infectious growth around the heart valves. ?Areas of heart muscle that are not working well because of poor blood flow or injury from a heart attack. ?Aneurysm detection. An aneurysm is a weak or damaged part of an artery wall. The wall bulges out from the normal force of blood pumping through the body. ?Tell a health care provider about: ?Any allergies you have. ?All medicines you are taking, including vitamins, herbs, eye drops, creams, and over-the-counter medicines. ?Any blood disorders you have. ?Any surgeries you have had. ?Any medical conditions you have. ?  Whether you are pregnant or may be pregnant. ?What are the risks? ?Generally, this is a safe test. However, problems may occur, including an allergic reaction to dye (contrast) that may be used during the test. ?What happens before the test? ?No specific preparation is needed. You may eat and drink normally. ?What happens during the test? ? ?You will take  off your clothes from the waist up and put on a hospital gown. ?Electrodes or electrocardiogram (ECG)patches may be placed on your chest. The electrodes or patches are then connected to a device that monitors your heart rate and rhythm. ?You will lie down on a table for an ultrasound exam. A gel will be applied to your chest to help sound waves pass through your skin. ?A handheld device, called a transducer, will be pressed against your chest and moved over your heart. The transducer produces sound waves that travel to your heart and bounce back (or "echo" back) to the transducer. These sound waves will be captured in real-time and changed into images of your heart that can be viewed on a video monitor. The images will be recorded on a computer and reviewed by your health care provider. ?You may be asked to change positions or hold your breath for a short time. This makes it easier to get different views or better views of your heart. ?In some cases, you may receive contrast through an IV in one of your veins. This can improve the quality of the pictures from your heart. ?The procedure may vary among health care providers and hospitals. ?What can I expect after the test? ?You may return to your normal, everyday life, including diet, activities, and medicines, unless your health care provider tells you not to do that. ?Follow these instructions at home: ?It is up to you to get the results of your test. Ask your health care provider, or the department that is doing the test, when your results will be ready. ?Keep all follow-up visits. This is important. ?Summary ?An echocardiogram is a test that uses sound waves (ultrasound) to produce images of the heart. ?Images from an echocardiogram can provide important information about the size and shape of your heart, heart muscle function, heart valve function, and other possible heart problems. ?You do not need to do anything to prepare before this test. You may eat and  drink normally. ?After the echocardiogram is completed, you may return to your normal, everyday life, unless your health care provider tells you not to do that. ?This information is not intended to replace advice given to you by your health care provider. Make sure you discuss any questions you have with your health care provider. ?Document Revised: 04/29/2021 Document Reviewed: 04/08/2020 ?Elsevier Patient Education ? [place]. ? ? ?Important Information About Sugar ? ? ? ? ? ? ?

## 2022-01-04 NOTE — Progress Notes (Signed)
?  ? ? ?Established patient visit ? ? ?Patient: Vanessa Carlson   DOB: April 11, 1961   61 y.o. Female  MRN: [id number] ?Visit Date: 01/04/2022 ? ?Today's healthcare provider: Mikey Kirschner, PA-C  ? ?I,Kathleen J Wolford,acting as a Education administrator for [employer], PA-C.,have documented all relevant documentation on the behalf of Mikey Kirschner, PA-C,as directed by  Mikey Kirschner, PA-C while in the presence of Mikey Kirschner, PA-C.  ?Chief Complaint  ?Patient presents with  ? Consult  ? ?Subjective  ?  ?Exposure to STD  ?Primary symptoms comment: partner with reported lesion. This is a new problem. The current episode started in the past 7 days. Pertinent negatives include no abdominal pain, anorexia, diaphoresis, fever, genital odor, rectal pain, sore throat or urinary frequency.  Last intercourse with three days ago, unprotected. Pt denies any symptoms other than occasional vaginal odor that comes and goes. Requesting full STI coverage testing. ? ?Medications: ?Outpatient Medications Prior to Visit  ?Medication Sig  ? aspirin 81 MG chewable tablet Chew 81 mg by mouth daily.  ? atorvastatin (LIPITOR) 80 MG tablet TAKE 1 TABLET BY MOUTH ONCE DAILY. OFFICE VISIT AND LABS NEEDED FOR FURTHER REFILLS.  ? cyclobenzaprine (FLEXERIL) 5 MG tablet Take 1 tablet (5 mg total) by mouth 3 (three) times daily as needed for muscle spasms.  ? enoxaparin (LOVENOX) 100 MG/ML injection Inject 1 mL (100 mg total) into the skin every 12 (twelve) hours.  ? ezetimibe (ZETIA) 10 MG tablet Take 1 tablet (10 mg total) by mouth daily.  ? fluticasone (FLONASE) 50 MCG/ACT nasal spray Place 2 sprays into both nostrils as needed for allergies or rhinitis.  ? furosemide (LASIX) 40 MG tablet Take 2 tablets (80 mg total) by mouth 2 (two) times daily. PLEASE SCHEDULE OFFICE VISIT FOR FURTHER REFILLS. THANK YOU!  ? gabapentin (NEURONTIN) 300 MG capsule Take 1 capsule (300 mg total) by mouth 2 (two) times daily.  ? metoprolol succinate (TOPROL-XL) 25 MG 24 hr  tablet TAKE 1 TABLET BY MOUTH IN THE MORNING AND 1 TABLET AT BEDTIME  ? nitroGLYCERIN (NITROSTAT) 0.3 MG SL tablet Place 1 tablet (0.3 mg total) under the tongue every 5 (five) minutes as needed for chest pain.  ? ondansetron (ZOFRAN) 4 MG tablet Take 1 tablet (4 mg total) by mouth every 8 (eight) hours as needed for nausea or vomiting.  ? oxyCODONE (OXY IR/ROXICODONE) 5 MG immediate release tablet Take 5 mg by mouth every 4 (four) hours as needed.  ? sacubitril-valsartan (ENTRESTO) 24-26 MG Take 1 tablet by mouth 2 (two) times daily. PLEASE SCHEDULE OFFICE VISIT FOR FURTHER REFILLS. THANK YOU!  ? spironolactone (ALDACTONE) 25 MG tablet TAKE 1 TABLET BY MOUTH ONCE DAILY . APPOINTMENT REQUIRED FOR FUTURE REFILLS.  ? traMADol (ULTRAM) 50 MG tablet TAKE 1 TABLET BY MOUTH EVERY 6 HOURS AS NEEDED  ? traZODone (DESYREL) 50 MG tablet Take 0.5-1 tablets (25-50 mg total) by mouth at bedtime as needed for sleep.  ? Vibegron (GEMTESA) 75 MG TABS Take 75 mg by mouth daily.  ? warfarin (COUMADIN) 10 MG tablet Take 5 mg by mouth daily. Takes on Saturdays  ? warfarin (COUMADIN) 7.5 MG tablet TAKE 1 TABLET BY MOUTH ONCE DAILY AT 4 PM. TAKE 1 TABLET BY MOUTH DAILY EXCEPT SATURDAYS  ? ?No facility-administered medications prior to visit.  ? ? ?Review of Systems  ?Constitutional:  Negative for diaphoresis and fever.  ?HENT:  Negative for sore throat.   ?Gastrointestinal:  Negative for abdominal pain, anorexia and  rectal pain.  ?Genitourinary:  Negative for frequency.  ? ? ?  Objective  ?  ?BP (!) 111/55   Pulse 80   Resp 16   Wt 223 lb 1.6 oz (101.2 kg)   LMP  (LMP Unknown)   SpO2 99%   BMI 40.16 kg/m?  ? ? ?Physical Exam ?Constitutional:   ?   General: She is awake.  ?   Appearance: She is well-developed.  ?HENT:  ?   Head: Normocephalic.  ?Eyes:  ?   Conjunctiva/sclera: Conjunctivae normal.  ?Cardiovascular:  ?   Rate and Rhythm: Normal rate.  ?Pulmonary:  ?   Effort: Pulmonary effort is normal.  ?Genitourinary: ?   General:  Normal vulva.  ?   Labia:     ?   Right: No rash or lesion.     ?   Left: No rash or lesion.   ?   Comments: Absence of cervix.  ?Some white thick vaginal discharge ?Skin: ?   General: Skin is warm.  ?Neurological:  ?   Mental Status: She is alert and oriented to person, place, and time.  ?Psychiatric:     ?   Attention and Perception: Attention normal.     ?   Mood and Affect: Mood normal.     ?   Speech: Speech normal.     ?   Behavior: Behavior is cooperative.  ?  ? ?No results found for any visits on 01/04/22. ? Assessment & Plan  ?  ? ?Possible STI exposure ?Normal appearing vulva and vagina ?Swab taken for c/g/trich/candidia/bv ?Bw ordered to r/o other STIs, HSV 1/2, RPR, HIV ? ?Return if symptoms worsen or fail to improve.  ?   ? ?I, Mikey Kirschner, PA-C have reviewed all documentation for this visit. The documentation on  01/04/2022 for the exam, diagnosis, procedures, and orders are all accurate and complete. ? ?Mikey Kirschner, PA-C ?[facility] ?[id number] ?[place], [place], [address] ?Office: [phone] ?Fax: [phone]  ? ? [facility] ?

## 2022-01-05 ENCOUNTER — Ambulatory Visit (INDEPENDENT_AMBULATORY_CARE_PROVIDER_SITE_OTHER): Payer: Medicare HMO

## 2022-01-05 DIAGNOSIS — I5022 Chronic systolic (congestive) heart failure: Secondary | ICD-10-CM | POA: Diagnosis not present

## 2022-01-05 DIAGNOSIS — I255 Ischemic cardiomyopathy: Secondary | ICD-10-CM

## 2022-01-05 LAB — CUP PACEART REMOTE DEVICE CHECK
Battery Remaining Longevity: 61 mo
Battery Voltage: 2.99 V
Brady Statistic RV Percent Paced: 0 %
Date Time Interrogation Session: 20230509012303
HighPow Impedance: 92 Ohm
Implantable Lead Implant Date: 20160802
Implantable Lead Location: 753860
Implantable Pulse Generator Implant Date: 20160802
Lead Channel Impedance Value: 513 Ohm
Lead Channel Impedance Value: 646 Ohm
Lead Channel Pacing Threshold Amplitude: 0.375 V
Lead Channel Pacing Threshold Pulse Width: 0.4 ms
Lead Channel Sensing Intrinsic Amplitude: 13.875 mV
Lead Channel Sensing Intrinsic Amplitude: 13.875 mV
Lead Channel Setting Pacing Amplitude: 2 V
Lead Channel Setting Pacing Pulse Width: 0.4 ms
Lead Channel Setting Sensing Sensitivity: 0.3 mV

## 2022-01-05 LAB — RPR: RPR Ser Ql: NONREACTIVE

## 2022-01-05 LAB — HSV(HERPES SIMPLEX VRS) I + II AB-IGG
HSV 1 Glycoprotein G Ab, IgG: 30.4 index — ABNORMAL HIGH (ref 0.00–0.90)
HSV 2 IgG, Type Spec: 10.3 index — ABNORMAL HIGH (ref 0.00–0.90)

## 2022-01-05 LAB — HIV ANTIBODY (ROUTINE TESTING W REFLEX): HIV Screen 4th Generation wRfx: NONREACTIVE

## 2022-01-06 DIAGNOSIS — Z96651 Presence of right artificial knee joint: Secondary | ICD-10-CM | POA: Diagnosis not present

## 2022-01-06 LAB — NUSWAB VAGINITIS PLUS (VG+)
Candida albicans, NAA: NEGATIVE
Candida glabrata, NAA: NEGATIVE
Chlamydia trachomatis, NAA: NEGATIVE
Neisseria gonorrhoeae, NAA: NEGATIVE
Trich vag by NAA: NEGATIVE

## 2022-01-06 LAB — SPECIMEN STATUS REPORT

## 2022-01-08 DIAGNOSIS — Z96651 Presence of right artificial knee joint: Secondary | ICD-10-CM | POA: Diagnosis not present

## 2022-01-12 ENCOUNTER — Ambulatory Visit
Admission: RE | Admit: 2022-01-12 | Discharge: 2022-01-12 | Disposition: A | Discharge status: Home / Self Care | Payer: Medicare HMO | Source: Ambulatory Visit | Attending: Oncology | Admitting: Oncology

## 2022-01-12 DIAGNOSIS — I700 Atherosclerosis of aorta: Secondary | ICD-10-CM | POA: Diagnosis not present

## 2022-01-12 DIAGNOSIS — C3411 Malignant neoplasm of upper lobe, right bronchus or lung: Secondary | ICD-10-CM | POA: Insufficient documentation

## 2022-01-12 DIAGNOSIS — J90 Pleural effusion, not elsewhere classified: Secondary | ICD-10-CM | POA: Diagnosis not present

## 2022-01-12 DIAGNOSIS — J432 Centrilobular emphysema: Secondary | ICD-10-CM | POA: Diagnosis not present

## 2022-01-12 DIAGNOSIS — C3490 Malignant neoplasm of unspecified part of unspecified bronchus or lung: Secondary | ICD-10-CM | POA: Diagnosis not present

## 2022-01-13 ENCOUNTER — Other Ambulatory Visit: Payer: Self-pay | Admitting: Family Medicine

## 2022-01-13 ENCOUNTER — Ambulatory Visit: Payer: Medicare HMO

## 2022-01-13 DIAGNOSIS — Z96651 Presence of right artificial knee joint: Secondary | ICD-10-CM | POA: Diagnosis not present

## 2022-01-13 NOTE — Telephone Encounter (Signed)
Requested medication (s) are due for refill today: yes ? ?Requested medication (s) are on the active medication list: yes ? ?Last refill:  12/15/21 #30/0 ? ?Future visit scheduled: yes ? ?Notes to clinic:  Unable to refill per protocol, cannot delegate. ? ?  ?Requested Prescriptions  ?Pending Prescriptions Disp Refills  ? traMADol (ULTRAM) 50 MG tablet [Pharmacy Med Name: traMADol HCl 50 MG Oral Tablet] 30 tablet 0  ?  Sig: TAKE 1 TABLET BY MOUTH EVERY 6 HOURS AS NEEDED  ?  ? Not Delegated - Analgesics:  Opioid Agonists Failed - 01/13/2022  2:18 PM  ?  ?  Failed - This refill cannot be delegated  ?  ?  Failed - Urine Drug Screen completed in last 360 days  ?  ?  Passed - Valid encounter within last 3 months  ?  Recent Outpatient Visits   ? ?      ? 1 week ago Sexually transmitted disease exposure  ? [facility] Thedore Mins, [place], PA-C  ? 1 month ago H/O mitral valve replacement with mechanical valve  ? [facility] Bacigalupo, Dionne Bucy, MD  ? 5 months ago Medicare annual wellness visit, subsequent  ? [place], FNP  ? 1 year ago Status post mitral valve replacement  ? [facility] Finkle[place], Dionne Bucy, MD  ? 1 year ago Allergic conjunctivitis of right eye  ? [facility] Bacigalupo, Dionne Bucy, MD  ? ?  ?  ?Future Appointments   ? ?        ? In 3 weeks Bacigalupo, Dionne Bucy, MD [facility]  ? In 5 months End, Harrell Gave, MD [facility]  ? ?  ? ? ?  ?  ?  ? ?

## 2022-01-14 ENCOUNTER — Encounter: Payer: Self-pay | Admitting: Family Medicine

## 2022-01-15 DIAGNOSIS — Z96651 Presence of right artificial knee joint: Secondary | ICD-10-CM | POA: Diagnosis not present

## 2022-01-18 ENCOUNTER — Inpatient Hospital Stay: Payer: Medicare HMO | Attending: Oncology | Admitting: Oncology

## 2022-01-18 ENCOUNTER — Encounter: Payer: Self-pay | Admitting: Oncology

## 2022-01-18 VITALS — BP 116/59 | HR 75 | Temp 98.1°F | Resp 16 | Ht 62.5 in | Wt 222.8 lb

## 2022-01-18 DIAGNOSIS — Z923 Personal history of irradiation: Secondary | ICD-10-CM | POA: Diagnosis not present

## 2022-01-18 DIAGNOSIS — C3411 Malignant neoplasm of upper lobe, right bronchus or lung: Secondary | ICD-10-CM

## 2022-01-18 DIAGNOSIS — Z85118 Personal history of other malignant neoplasm of bronchus and lung: Secondary | ICD-10-CM

## 2022-01-18 DIAGNOSIS — C3490 Malignant neoplasm of unspecified part of unspecified bronchus or lung: Secondary | ICD-10-CM | POA: Insufficient documentation

## 2022-01-18 DIAGNOSIS — Z08 Encounter for follow-up examination after completed treatment for malignant neoplasm: Secondary | ICD-10-CM

## 2022-01-18 NOTE — Progress Notes (Signed)
Remote ICD transmission.   

## 2022-01-18 NOTE — Progress Notes (Unsigned)
Pt has still pains from lung surgery- it comes quickly and leaves quickly, she says the spot at lung still has sore feeling .

## 2022-01-20 ENCOUNTER — Ambulatory Visit (INDEPENDENT_AMBULATORY_CARE_PROVIDER_SITE_OTHER): Payer: Medicare HMO

## 2022-01-20 ENCOUNTER — Other Ambulatory Visit: Payer: Self-pay | Admitting: Internal Medicine

## 2022-01-20 DIAGNOSIS — Z952 Presence of prosthetic heart valve: Secondary | ICD-10-CM | POA: Diagnosis not present

## 2022-01-20 DIAGNOSIS — I480 Paroxysmal atrial fibrillation: Secondary | ICD-10-CM | POA: Diagnosis not present

## 2022-01-20 DIAGNOSIS — Z96651 Presence of right artificial knee joint: Secondary | ICD-10-CM | POA: Diagnosis not present

## 2022-01-20 LAB — POCT INR
INR: 1.7 — AB (ref 2.0–3.0)
PT: 20.3

## 2022-01-20 NOTE — Patient Instructions (Signed)
Description   5mg  M&F except 7.5mg  daily F/U in 2 wks.

## 2022-01-21 NOTE — Progress Notes (Signed)
Hematology/Oncology Consult note [facility]  Telephone:([phone] [phone]  Patient Care Team: Virginia Crews, MD as PCP - General (Family Medicine) End, Harrell Gave, MD as PCP - Cardiology (Cardiology) Sindy Guadeloupe, MD as Consulting Physician (Oncology) Sindy Guadeloupe, MD as Consulting Physician (Oncology) Noreene Filbert, MD as Consulting Physician (Radiation Oncology) Lovell Sheehan, MD as Consulting Physician (Orthopedic Surgery)   Name of the patient: Raina Sole  [id number]  September 29, 1960   Date of visit: 01/21/22  Diagnosis- stage I lung cancer s/p SBRT  Chief complaint/ Reason for visit-routine follow-up of lung cancer  Heme/Onc history: Patient is a 61 year old female with a past medical history significant for CAD, ischemic cardiomyopathy status post AICD placement, rheumatic heart disease status post mechanical mitral valve replacement on Coumadin.She was found to have a right upper lobe lung mass 2.5 cm that was hypermetabolic on PET CT scan in January 2019.  No evidence of nodal involvement.  She was not deemed to be a candidate for surgery and underwent SBRT to this lesion.  This was biopsy-proven adenocarcinoma with acinar and solid morphologies.  She then had a CT scan in January 2020 which showed enlarging satellite right upper lobe lung nodules.  This was followed by a PET CT scan which showed right upper lobe lung nodules 2 of them were hypermetabolic with an SUV of 8.7 was also a thyroid nodule approximately 4 mm in size which was not definitely hypermetabolic.  Patient was seen by Dr. Donella Stade following this and sh received SBRT for 5 fractions encompassing all these 3 nodules.  Patient has not had any systemic chemotherapy for her lung cancer so far.  There is a gradual increase in the size of her right upper lobe lung nodules in May 2022 and patient received SBRT to this area again by Dr. Donella Stade    Interval history-she  is doing well overall.  Breathing is stable and she has not had any recent hospitalizations.  She will be undergoing total knee replacement soon.  ECOG PS- 1 Pain scale- 3   Review of systems- Review of Systems  Constitutional:  Negative for chills, fever, malaise/fatigue and weight loss.  HENT:  Negative for congestion, ear discharge and nosebleeds.   Eyes:  Negative for blurred vision.  Respiratory:  Negative for cough, hemoptysis, sputum production, shortness of breath and wheezing.   Cardiovascular:  Negative for chest pain, palpitations, orthopnea and claudication.  Gastrointestinal:  Negative for abdominal pain, blood in stool, constipation, diarrhea, heartburn, melena, nausea and vomiting.  Genitourinary:  Negative for dysuria, flank pain, frequency, hematuria and urgency.  Musculoskeletal:  Positive for joint pain. Negative for back pain and myalgias.  Skin:  Negative for rash.  Neurological:  Negative for dizziness, tingling, focal weakness, seizures, weakness and headaches.  Endo/Heme/Allergies:  Does not bruise/bleed easily.  Psychiatric/Behavioral:  Negative for depression and suicidal ideas. The patient does not have insomnia.       No Known Allergies   Past Medical History:  Diagnosis Date   AICD (automatic cardioverter/defibrillator) present    Anterior myocardial infarction ([place])    a. 10/2014 - occluded LAD, complicated by cardiogenic shock-->Med Rx as interventional team was unable to open LAD.   Arthritis    arthirits of knee on right   Cancer of upper lobe of right lung ([place]) 09/2017   radiation therapy right side   Cervical cancer (HCC)    in remission for ~20 yrs, s/p hysterectomy   Chronic  combined systolic and diastolic CHF (congestive heart failure) ([place])    a. 10/2015 Echo: EF 20-25%, sev diast dysfxn, mildly reduced RV fxn. Nl MV prosthesis; b. 12/2017 Echo: EF 20-25%, diff HK. Sev ant/antsept HK, apical AK. Mild MS (mean graad 54mmHg). Mod TR. PASP 51mmHg.    Coronary artery disease    a. 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   Essential hypertension    Ischemic cardiomyopathy    a. 03/2015 s/p MDT single lead AICD;  b. 10/2015 Echo: EF 20-25%;  c. 12/2017 Echo: EF 20-25%, diff HK.   Mass of upper lobe of right lung    a. noted on CXR & CT 08/2017 w/ abnl PET CT.   Mitral valve disease    a. 2006 s/p MVR w/ 25mm SJM bileaflet mechanical valve-->chronic coumadin/ASA; b. 12/2017 Echo: Mild MS. Mean gradient 62mmHg.   Personal history of radiation therapy    f/u lung ca     Past Surgical History:  Procedure Laterality Date   APPENDECTOMY  1998   BREAST EXCISIONAL BIOPSY Left 80s   benign   CARDIAC CATHETERIZATION     CARDIAC DEFIBRILLATOR PLACEMENT     COLONOSCOPY WITH PROPOFOL N/A 01/11/2020   Procedure: COLONOSCOPY WITH PROPOFOL;  Surgeon: Jonathon Bellows, MD;  Location: [facility] ENDOSCOPY;  Service: Gastroenterology;  Laterality: N/A;   RADICAL HYSTERECTOMY  1998   REPLACEMENT TOTAL KNEE Right    SHOULDER SURGERY Bilateral    rotator cuff tears   VALVE REPLACEMENT     Mitral valve; 27 mm [employer]. Jude bileaflet valve    Social History   Socioeconomic History   Marital status: Married    Spouse name: Tosin   Number of children: 2   Years of education: 14   Highest education level: Some college, no degree  Occupational History   Occupation: disable  Tobacco Use   Smoking status: Former    Packs/day: 1.00    Years: 4.00    Pack years: 4.00    Types: Cigarettes    Quit date: 1998    Years since quitting: 25.4   Smokeless tobacco: Never  Vaping Use   Vaping Use: Never used  Substance and Sexual Activity   Alcohol use: No   Drug use: No   Sexual activity: Yes    Partners: Male    Birth control/protection: Surgical  Other Topics Concern   Not on file  Social History Narrative   Not on file   Social Determinants of Health   Financial  Resource Strain: Low Risk    Difficulty of Paying Living Expenses: Not hard at all  Food Insecurity: No Food Insecurity   Worried About Charity fundraiser in the Last Year: Never true   Ran Out of Food in the Last Year: Never true  Transportation Needs: No Transportation Needs   Lack of Transportation (Medical): No   Lack of Transportation (Non-Medical): No  Physical Activity: Insufficiently Active   Days of Exercise per Week: 2 days   Minutes of Exercise per Session: 20 min  Stress: No Stress Concern Present   Feeling of Stress : Not at all  Social Connections: Moderately Integrated   Frequency of Communication with Friends and Family: Three times a week   Frequency of Social Gatherings with Friends and Family: Once a week   Attends Religious Services: More than 4 times per year   Active Member of Clubs or Organizations: No  Attends Archivist Meetings: Never   Marital Status: Married  Human resources officer Violence: Not At Risk   Fear of Current or Ex-Partner: No   Emotionally Abused: No   Physically Abused: No   Sexually Abused: No    Family History  Problem Relation Age of Onset   Heart attack Mother    Hypertension Mother    Diabetes Mother    Emphysema Father        smoker   Diabetes Maternal Grandmother    Kidney disease Maternal Grandmother    Breast cancer Maternal Aunt    Breast cancer Maternal Aunt    Colon cancer Maternal Uncle      Current Outpatient Medications:    aspirin 81 MG chewable tablet, Chew 81 mg by mouth daily., Disp: , Rfl:    atorvastatin (LIPITOR) 80 MG tablet, TAKE 1 TABLET BY MOUTH ONCE DAILY. OFFICE VISIT AND LABS NEEDED FOR FURTHER REFILLS., Disp: 90 tablet, Rfl: 3   cyclobenzaprine (FLEXERIL) 5 MG tablet, Take 1 tablet (5 mg total) by mouth 3 (three) times daily as needed for muscle spasms., Disp: 30 tablet, Rfl: 1   ezetimibe (ZETIA) 10 MG tablet, Take 1 tablet (10 mg total) by mouth daily., Disp: 90 tablet, Rfl: 3   fluticasone  (FLONASE) 50 MCG/ACT nasal spray, Place 2 sprays into both nostrils as needed for allergies or rhinitis., Disp: , Rfl:    gabapentin (NEURONTIN) 300 MG capsule, Take 1 capsule (300 mg total) by mouth 2 (two) times daily., Disp: 180 capsule, Rfl: 3   metoprolol succinate (TOPROL-XL) 25 MG 24 hr tablet, TAKE 1 TABLET BY MOUTH IN THE MORNING AND 1 TABLET AT BEDTIME, Disp: 180 tablet, Rfl: 0   nitroGLYCERIN (NITROSTAT) 0.3 MG SL tablet, Place 1 tablet (0.3 mg total) under the tongue every 5 (five) minutes as needed for chest pain., Disp: 30 tablet, Rfl: 3   oxyCODONE (OXY IR/ROXICODONE) 5 MG immediate release tablet, Take 5 mg by mouth every 4 (four) hours as needed., Disp: , Rfl:    sacubitril-valsartan (ENTRESTO) 24-26 MG, Take 1 tablet by mouth 2 (two) times daily. PLEASE SCHEDULE OFFICE VISIT FOR FURTHER REFILLS. THANK YOU!, Disp: 180 tablet, Rfl: 0   spironolactone (ALDACTONE) 25 MG tablet, TAKE 1 TABLET BY MOUTH ONCE DAILY . APPOINTMENT REQUIRED FOR FUTURE REFILLS., Disp: 90 tablet, Rfl: 0   traMADol (ULTRAM) 50 MG tablet, TAKE 1 TABLET BY MOUTH EVERY 6 HOURS AS NEEDED, Disp: 30 tablet, Rfl: 0   Vibegron (GEMTESA) 75 MG TABS, Take 75 mg by mouth daily., Disp: 30 tablet, Rfl: 11   warfarin (COUMADIN) 10 MG tablet, Take 5 mg by mouth daily. Takes on Saturdays, Disp: , Rfl:    warfarin (COUMADIN) 7.5 MG tablet, TAKE 1 TABLET BY MOUTH ONCE DAILY AT 4 PM. TAKE 1 TABLET BY MOUTH DAILY EXCEPT SATURDAYS, Disp: 90 tablet, Rfl: 2   furosemide (LASIX) 40 MG tablet, Take 2 tablets (80 mg total) by mouth 2 (two) times daily., Disp: 120 tablet, Rfl: 5   ondansetron (ZOFRAN) 4 MG tablet, Take 1 tablet (4 mg total) by mouth every 8 (eight) hours as needed for nausea or vomiting. (Patient not taking: Reported on 01/18/2022), Disp: 20 tablet, Rfl: 0   traZODone (DESYREL) 50 MG tablet, Take 0.5-1 tablets (25-50 mg total) by mouth at bedtime as needed for sleep. (Patient not taking: Reported on 01/18/2022), Disp: 30  tablet, Rfl: 3  Physical exam:  Vitals:   01/18/22 1038  BP: (!) 116/59  Pulse: 75  Resp: 16  Temp: 98.1 F (36.7 C)  TempSrc: Oral  Weight: 222 lb 12.8 oz (101.1 kg)  Height: 5' 2.5" (1.588 m)   Physical Exam Cardiovascular:     Rate and Rhythm: Normal rate and regular rhythm.     Heart sounds: Normal heart sounds.  Pulmonary:     Effort: Pulmonary effort is normal.     Breath sounds: Normal breath sounds.  Skin:    General: Skin is warm and dry.  Neurological:     Mental Status: She is alert and oriented to person, place, and time.        Latest Ref Rng & Units 10/02/2021    9:27 AM  CMP  Glucose 70 - 99 mg/dL 176    BUN 8 - 23 mg/dL 14    Creatinine 0.44 - 1.00 mg/dL 1.12    Sodium 135 - 145 mmol/L 137    Potassium 3.5 - 5.1 mmol/L 4.2    Chloride 98 - 111 mmol/L 102    CO2 22 - 32 mmol/L 28    Calcium 8.9 - 10.3 mg/dL 9.2    Total Protein 6.5 - 8.1 g/dL 8.4    Total Bilirubin 0.3 - 1.2 mg/dL 0.7    Alkaline Phos 38 - 126 U/L 70    AST 15 - 41 U/L 24    ALT 0 - 44 U/L 18        Latest Ref Rng & Units 10/02/2021    9:27 AM  CBC  WBC 4.0 - 10.5 K/uL 5.5    Hemoglobin 12.0 - 15.0 g/dL 12.7    Hematocrit 36.0 - 46.0 % 38.9    Platelets 150 - 400 K/uL 186      No images are attached to the encounter.  CT Chest Wo Contrast  Result Date: 01/13/2022 CLINICAL DATA:  Stage I lung cancer status post SB RT. Restaging. * Tracking Code: BO * EXAM: CT CHEST WITHOUT CONTRAST TECHNIQUE: Multidetector CT imaging of the chest was performed following the standard protocol without IV contrast. RADIATION DOSE REDUCTION: This exam was performed according to the departmental dose-optimization program which includes automated exposure control, adjustment of the mA and/or kV according to patient size and/or use of iterative reconstruction technique. COMPARISON:  Chest CT 10/02/2021 and 06/15/2021.  PET-CT 01/07/2021. FINDINGS: Cardiovascular: Atherosclerosis of the aorta, great  vessels and coronary arteries. Left subclavian AICD lead appears unchanged, terminating at the right ventricular apex. There is looping of the lead inferiorly in the right atrium. Previous mitral valve replacement and calcification at the left ventricular apex again noted. Overall heart size is stable. No significant pericardial fluid. Mediastinum/Nodes: 11 mm short axis right paratracheal node on image 37/2 is similar to the most recent study, previously measuring 10 mm. No new or enlarging mediastinal, hilar or axillary lymph nodes are identified. Hilar assessment is limited by the lack of intravenous contrast. The thyroid gland, trachea and esophagus demonstrate no significant findings. Lungs/Pleura: Trace dependent right pleural effusion. No pneumothorax. Mild underlying centrilobular and paraseptal emphysema with mild central airway thickening. The fibrosis and volume loss medially in the right upper lobe have not significantly changed from the most recent study, measuring up to 5.1 x 2.5 cm on image 48/2. Adjacent pulmonary parenchymal opacities are unchanged. No new or enlarging pulmonary nodules are identified. Upper abdomen: The visualized upper abdomen appears stable without significant findings. Musculoskeletal/Chest wall: Stable appearance of the right 2nd and 3rd ribs with nonunited fractures and adjacent lysis.  No progressive osteolysis, new bone lesion or chest wall mass. IMPRESSION: 1. Stable treatment changes medially in the right upper lobe with associated volume loss and fibrosis. No progression to suggest local recurrence. 2. A mildly enlarged right paratracheal lymph node has not significantly changed from the prior study of 3 months ago and may be reactive. 3. Stable deformities of the right 2nd and 3rd ribs laterally. 4. No new findings or definite signs of metastatic disease. Recommend continued CT follow-up with consideration of follow-up PET-CT as clinically warranted to assess for  recurrent hypermetabolic activity. 5. Stable incidental findings including coronary and Aortic Atherosclerosis (ICD10-I70.0). Electronically Signed   By: Richardean Sale M.D.   On: 01/13/2022 11:19   CUP PACEART REMOTE DEVICE CHECK  Result Date: 01/05/2022 Scheduled remote reviewed. Normal device function.  Next remote 91 days- JJB/CVRS    Assessment and plan- Patient is a 61 y.o. female with history of stage I lung cancer s/p SBRT here for routine follow-up  I have reviewed CT chest images independently and discussed findings with the patient which showsStable changes in the right upper lobe postradiation and radiation associated fibrosis.  She was noted to have mild right paratracheal lymph node enlargement which has not changed between February to May 2023.  I will repeat a CT chest in 4 months to follow-up on these findings and overall of the lymph node appears stable we will go back to doing CT scans every 6 months.  Patient verbalized understanding   Visit Diagnosis 1. Encounter for follow-up surveillance of lung cancer      Dr. Randa Evens, MD, MPH [facility] [phone] 01/21/2022 6:58 PM

## 2022-01-22 DIAGNOSIS — Z96651 Presence of right artificial knee joint: Secondary | ICD-10-CM | POA: Diagnosis not present

## 2022-01-26 DIAGNOSIS — Z96651 Presence of right artificial knee joint: Secondary | ICD-10-CM | POA: Diagnosis not present

## 2022-01-28 ENCOUNTER — Ambulatory Visit (INDEPENDENT_AMBULATORY_CARE_PROVIDER_SITE_OTHER): Payer: Medicare HMO

## 2022-01-28 DIAGNOSIS — Z952 Presence of prosthetic heart valve: Secondary | ICD-10-CM | POA: Diagnosis not present

## 2022-01-28 LAB — ECHOCARDIOGRAM COMPLETE
AR max vel: 0.92 cm2
AV Area VTI: 0.99 cm2
AV Area mean vel: 0.9 cm2
AV Mean grad: 4 mmHg
AV Peak grad: 6.9 mmHg
Ao pk vel: 1.31 m/s
Calc EF: 37.5 %
S' Lateral: 4.9 cm
Single Plane A2C EF: 35.9 %
Single Plane A4C EF: 35.5 %

## 2022-01-29 ENCOUNTER — Other Ambulatory Visit: Payer: Self-pay | Admitting: Family Medicine

## 2022-02-01 DIAGNOSIS — Z96651 Presence of right artificial knee joint: Secondary | ICD-10-CM | POA: Diagnosis not present

## 2022-02-02 NOTE — Progress Notes (Signed)
I,Sulibeya S Dimas,acting as a Education administrator for Lavon Paganini, MD.,have documented all relevant documentation on the behalf of Lavon Paganini, MD,as directed by  Lavon Paganini, MD while in the presence of Lavon Paganini, MD.   Established patient visit   Patient: Vanessa Carlson   DOB: 01/15/1961   61 y.o. Female  MRN: [id number] Visit Date: 02/04/2022  Today's healthcare provider: Lavon Paganini, MD   Chief Complaint  Patient presents with   Follow-up   Subjective    HPI  Follow up for status post mitral valve replacement  The patient was last seen for this 2 weeks ago. Changes made at last visit include 5mg  Monday and Friday except 7.5mg  daily.  She reports excellent compliance with treatment. She feels that condition is Unchanged. She is not having side effects.   Lab Results  Component Value Date   INR 3.9 (A) 02/04/2022   INR 1.7 (A) 01/20/2022   INR 1.4 (A) 12/30/2021    -----------------------------------------------------------------------------------------   Medications: Outpatient Medications Prior to Visit  Medication Sig   aspirin 81 MG chewable tablet Chew 81 mg by mouth daily.   atorvastatin (LIPITOR) 80 MG tablet TAKE 1 TABLET BY MOUTH ONCE DAILY. OFFICE VISIT AND LABS NEEDED FOR FURTHER REFILLS.   cyclobenzaprine (FLEXERIL) 5 MG tablet Take 1 tablet (5 mg total) by mouth 3 (three) times daily as needed for muscle spasms.   ezetimibe (ZETIA) 10 MG tablet Take 1 tablet (10 mg total) by mouth daily.   fluticasone (FLONASE) 50 MCG/ACT nasal spray Place 2 sprays into both nostrils as needed for allergies or rhinitis.   furosemide (LASIX) 40 MG tablet Take 2 tablets (80 mg total) by mouth 2 (two) times daily.   gabapentin (NEURONTIN) 300 MG capsule Take 1 capsule (300 mg total) by mouth 2 (two) times daily.   metoprolol succinate (TOPROL-XL) 25 MG 24 hr tablet TAKE 1 TABLET BY MOUTH IN THE MORNING AND 1 TABLET AT BEDTIME   nitroGLYCERIN  (NITROSTAT) 0.3 MG SL tablet Place 1 tablet (0.3 mg total) under the tongue every 5 (five) minutes as needed for chest pain.   oxyCODONE (OXY IR/ROXICODONE) 5 MG immediate release tablet Take 5 mg by mouth every 4 (four) hours as needed.   sacubitril-valsartan (ENTRESTO) 24-26 MG Take 1 tablet by mouth 2 (two) times daily. PLEASE SCHEDULE OFFICE VISIT FOR FURTHER REFILLS. THANK YOU!   spironolactone (ALDACTONE) 25 MG tablet TAKE 1 TABLET BY MOUTH ONCE DAILY. APPOINTMENT NEEDED FOR FURTHER REFILLS.   traMADol (ULTRAM) 50 MG tablet TAKE 1 TABLET BY MOUTH EVERY 6 HOURS AS NEEDED   traZODone (DESYREL) 50 MG tablet Take 0.5-1 tablets (25-50 mg total) by mouth at bedtime as needed for sleep.   Vibegron (GEMTESA) 75 MG TABS Take 75 mg by mouth daily.   warfarin (COUMADIN) 10 MG tablet Take 5 mg by mouth daily. Takes on Saturdays   warfarin (COUMADIN) 7.5 MG tablet TAKE 1 TABLET BY MOUTH ONCE DAILY AT 4 PM. TAKE 1 TABLET BY MOUTH DAILY EXCEPT SATURDAYS   [DISCONTINUED] ondansetron (ZOFRAN) 4 MG tablet Take 1 tablet (4 mg total) by mouth every 8 (eight) hours as needed for nausea or vomiting.   No facility-administered medications prior to visit.    Review of Systems  Constitutional:  Negative for appetite change.  Respiratory:  Negative for shortness of breath.   Cardiovascular:  Negative for chest pain and palpitations.  Hematological:  Does not bruise/bleed easily.    Last CBC Lab Results  Component Value Date   WBC 5.5 10/02/2021   HGB 12.7 10/02/2021   HCT 38.9 10/02/2021   MCV 88.8 10/02/2021   MCH 29.0 10/02/2021   RDW 15.0 10/02/2021   PLT 186 62/70/3500   Last metabolic panel Lab Results  Component Value Date   GLUCOSE 176 (H) 10/02/2021   NA 137 10/02/2021   K 4.2 10/02/2021   CL 102 10/02/2021   CO2 28 10/02/2021   BUN 14 10/02/2021   CREATININE 1.12 (H) 10/02/2021   GFRNONAA 56 (L) 10/02/2021   CALCIUM 9.2 10/02/2021   PROT 8.4 (H) 10/02/2021   ALBUMIN 4.3 10/02/2021    LABGLOB 2.8 08/18/2018   AGRATIO 1.5 08/18/2018   BILITOT 0.7 10/02/2021   ALKPHOS 70 10/02/2021   AST 24 10/02/2021   ALT 18 10/02/2021   ANIONGAP 7 10/02/2021   Last lipids Lab Results  Component Value Date   CHOL 99 04/14/2021   HDL 34 (L) 04/14/2021   LDLCALC 52 04/14/2021   TRIG 67 04/14/2021   CHOLHDL 2.9 04/14/2021   Last hemoglobin A1c Lab Results  Component Value Date   HGBA1C 5.9 (H) 12/14/2019       Objective    BP 96/66 (BP Location: Left Arm, Patient Position: Sitting, Cuff Size: Large)   Pulse 64   Temp 98 F (36.7 C) (Oral)   Resp 16   Wt 228 lb (103.4 kg)   LMP  (LMP Unknown)   BMI 41.04 kg/m  BP Readings from Last 3 Encounters:  02/04/22 96/66  01/18/22 (!) 116/59  01/04/22 (!) 111/55   Wt Readings from Last 3 Encounters:  02/04/22 228 lb (103.4 kg)  01/18/22 222 lb 12.8 oz (101.1 kg)  01/04/22 223 lb 1.6 oz (101.2 kg)      Physical Exam Vitals reviewed.  Constitutional:      General: She is not in acute distress.    Appearance: Normal appearance. She is well-developed. She is not diaphoretic.  HENT:     Head: Normocephalic and atraumatic.  Eyes:     General: No scleral icterus.    Conjunctiva/sclera: Conjunctivae normal.  Neck:     Thyroid: No thyromegaly.  Cardiovascular:     Rate and Rhythm: Normal rate and regular rhythm.     Pulses: Normal pulses.     Heart sounds: Murmur (mechanical click) heard.  Pulmonary:     Effort: Pulmonary effort is normal. No respiratory distress.     Breath sounds: Normal breath sounds. No wheezing, rhonchi or rales.  Musculoskeletal:     Cervical back: Neck supple.     Right lower leg: No edema.     Left lower leg: No edema.  Lymphadenopathy:     Cervical: No cervical adenopathy.  Skin:    General: Skin is warm and dry.     Findings: No rash.  Neurological:     Mental Status: She is alert and oriented to person, place, and time. Mental status is at baseline.  Psychiatric:        Mood and  Affect: Mood normal.        Behavior: Behavior normal.       Results for orders placed or performed in visit on 02/04/22  POCT INR  Result Value Ref Range   INR 3.9 (A) 2.0 - 3.0   PT 46.5     Assessment & Plan     Problem List Items Addressed This Visit       Cardiovascular and Mediastinum   HTN (  hypertension) (Chronic)    Well controlled Continue current medications Reviewed recent metabolic panel F/u in 6 months       Coronary artery disease of native artery of native heart with stable angina pectoris ([place])    Medically managed No angina currently Followed by cardiology No changes to medications today      Paroxysmal atrial fibrillation (HCC)    In NSR on exam today Followed by cardiology Continue metoprolol Continue anticoagulation as below        Nervous and Auditory   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[facility])    Chronic and well-controlled Continue gabapentin at current dose        Other   H/O mitral valve replacement with mechanical valve    Chronic and stable Followed by cardiology Continue Coumadin INR goal 2.5-3.5 3.9 today so we will decrease her dose to 5 mg 3 times weekly and 7 and half on all other days She will have this rechecked in 2 weeks      Morbid obesity ([place])    Discussed importance of healthy weight management Discussed diet and exercise       Hyperlipidemia LDL goal <70    Previously well controlled Continue statin and Zetia Repeat FLP and CMP at next visit Also followed by cardiology      Other Visit Diagnoses     Status post mitral valve replacement    -  Primary        Return in about 6 months (around 08/06/2022) for CPE.      I, Lavon Paganini, MD, have reviewed all documentation for this visit. The documentation on 02/04/22 for the exam, diagnosis, procedures, and orders are all accurate and complete.   Ramces Shomaker, Dionne Bucy, MD, MPH [facility]

## 2022-02-03 ENCOUNTER — Telehealth: Payer: Self-pay | Admitting: *Deleted

## 2022-02-03 ENCOUNTER — Ambulatory Visit: Payer: Medicare HMO

## 2022-02-03 ENCOUNTER — Encounter: Payer: Self-pay | Admitting: Internal Medicine

## 2022-02-03 DIAGNOSIS — Z96651 Presence of right artificial knee joint: Secondary | ICD-10-CM | POA: Diagnosis not present

## 2022-02-03 NOTE — Telephone Encounter (Signed)
-----   Message from Theora Gianotti, NP sent at 01/29/2022 10:59 AM EDT ----- Heart squeezing function is reduced @ 25-30%, similar to what was seen in 12/2017.  Mechanical mitral valve is working appropriately.

## 2022-02-03 NOTE — Telephone Encounter (Signed)
Left voicemail message to call back for review of results.  

## 2022-02-04 ENCOUNTER — Encounter: Payer: Self-pay | Admitting: Family Medicine

## 2022-02-04 ENCOUNTER — Ambulatory Visit (INDEPENDENT_AMBULATORY_CARE_PROVIDER_SITE_OTHER): Payer: Medicare HMO | Admitting: Family Medicine

## 2022-02-04 VITALS — BP 96/66 | HR 64 | Temp 98.0°F | Resp 16 | Wt 228.0 lb

## 2022-02-04 DIAGNOSIS — I10 Essential (primary) hypertension: Secondary | ICD-10-CM

## 2022-02-04 DIAGNOSIS — I480 Paroxysmal atrial fibrillation: Secondary | ICD-10-CM | POA: Diagnosis not present

## 2022-02-04 DIAGNOSIS — I25118 Atherosclerotic heart disease of native coronary artery with other forms of angina pectoris: Secondary | ICD-10-CM

## 2022-02-04 DIAGNOSIS — Z952 Presence of prosthetic heart valve: Secondary | ICD-10-CM

## 2022-02-04 DIAGNOSIS — E785 Hyperlipidemia, unspecified: Secondary | ICD-10-CM

## 2022-02-04 DIAGNOSIS — G6282 Radiation-induced polyneuropathy: Secondary | ICD-10-CM

## 2022-02-04 LAB — POCT INR
INR: 3.9 — AB (ref 2.0–3.0)
PT: 46.5

## 2022-02-04 NOTE — Assessment & Plan Note (Signed)
Well controlled Continue current medications Reviewed recent metabolic panel F/u in 6 months  

## 2022-02-04 NOTE — Assessment & Plan Note (Signed)
In NSR on exam today Followed by cardiology Continue metoprolol Continue anticoagulation as below

## 2022-02-04 NOTE — Assessment & Plan Note (Signed)
Chronic and stable Followed by cardiology Continue Coumadin INR goal 2.5-3.5 3.9 today so we will decrease her dose to 5 mg 3 times weekly and 7 and half on all other days She will have this rechecked in 2 weeks

## 2022-02-04 NOTE — Assessment & Plan Note (Signed)
Medically managed No angina currently Followed by cardiology No changes to medications today

## 2022-02-04 NOTE — Progress Notes (Signed)
No ICM remote transmission received for 02/01/2022 and next ICM transmission scheduled for 02/22/2022.

## 2022-02-04 NOTE — Assessment & Plan Note (Signed)
Previously well controlled Continue statin and Zetia Repeat FLP and CMP at next visit Also followed by cardiology

## 2022-02-04 NOTE — Patient Instructions (Signed)
Change coumadin to 7.5mg  daily except 5mg  on Tues, Thur, Sat Recheck in 2 weeks

## 2022-02-04 NOTE — Assessment & Plan Note (Signed)
Discussed importance of healthy weight management Discussed diet and exercise  

## 2022-02-04 NOTE — Assessment & Plan Note (Signed)
Chronic and well-controlled Continue gabapentin at current dose

## 2022-02-10 DIAGNOSIS — Z96651 Presence of right artificial knee joint: Secondary | ICD-10-CM | POA: Diagnosis not present

## 2022-02-10 NOTE — Telephone Encounter (Signed)
02/03/2022  3:28 PM EDT     Reviewed results and recommendations with patient and she verbalized understanding with no further questions at this time

## 2022-02-12 ENCOUNTER — Other Ambulatory Visit: Payer: Self-pay | Admitting: Internal Medicine

## 2022-02-14 ENCOUNTER — Other Ambulatory Visit: Payer: Self-pay | Admitting: Family Medicine

## 2022-02-16 DIAGNOSIS — Z96651 Presence of right artificial knee joint: Secondary | ICD-10-CM | POA: Diagnosis not present

## 2022-02-17 ENCOUNTER — Ambulatory Visit: Payer: Medicare HMO

## 2022-02-18 DIAGNOSIS — Z96651 Presence of right artificial knee joint: Secondary | ICD-10-CM | POA: Diagnosis not present

## 2022-02-22 ENCOUNTER — Ambulatory Visit (INDEPENDENT_AMBULATORY_CARE_PROVIDER_SITE_OTHER): Payer: Medicare HMO

## 2022-02-22 DIAGNOSIS — Z95810 Presence of automatic (implantable) cardiac defibrillator: Secondary | ICD-10-CM | POA: Diagnosis not present

## 2022-02-22 DIAGNOSIS — I5022 Chronic systolic (congestive) heart failure: Secondary | ICD-10-CM

## 2022-02-23 DIAGNOSIS — Z96651 Presence of right artificial knee joint: Secondary | ICD-10-CM | POA: Diagnosis not present

## 2022-02-24 ENCOUNTER — Telehealth: Payer: Self-pay

## 2022-02-24 NOTE — Progress Notes (Signed)
EPIC Encounter for ICM Monitoring  Patient Name: Vanessa Carlson is a 61 y.o. female Date: 02/24/2022 Primary Care Physican: Virginia Crews, MD Primary Cardiologist: Carlis Abbott, NP HF Electrophysiologist: Caryl Comes 02/04/2022 Office Weight: 228 lbs 02/24/2022 Weight: 226 lbs         Spoke with patient and heart failure questions reviewed.  Pt asymptomatic for fluid accumulation.  Reports feeling well at this time and voices no complaints.    Optivol thoracic impedance suggesting normal fluid levels.    Prescribed:  Furosemide 40 mg 2 tablets (80 mg total) by mouth two times daily Spironolactone 25 mg take 1 tablet daily   Labs: 10/02/2021 Creatinine 1.12, BUN 14, Potassium 4.2, Sodium 137, GFR 56 A complete set of results can be found in Results Review.   Recommendations:  No changes and encouraged to call if experiencing any fluid symptoms.    Follow-up plan: ICM clinic phone appointment on 03/29/2022.   91 day device clinic remote transmission 04/06/2022.       EP/Cardiology Office Visits:  03/23/2022 with Dr. Caryl Comes (needs updated DPR).  07/07/2022 with Dr End.   Copy of ICM check sent to Dr. Caryl Comes.   3 month ICM trend: 02/22/2022.    12-14 Month ICM trend:     [name], RN 02/24/2022 10:26 AM

## 2022-02-24 NOTE — Telephone Encounter (Signed)
Remote ICM transmission received.  Attempted call to patient regarding ICM remote transmission and left message to return call   

## 2022-02-25 DIAGNOSIS — Z96651 Presence of right artificial knee joint: Secondary | ICD-10-CM | POA: Diagnosis not present

## 2022-03-01 DIAGNOSIS — Z96651 Presence of right artificial knee joint: Secondary | ICD-10-CM | POA: Diagnosis not present

## 2022-03-06 ENCOUNTER — Other Ambulatory Visit: Payer: Self-pay | Admitting: Internal Medicine

## 2022-03-10 ENCOUNTER — Ambulatory Visit (INDEPENDENT_AMBULATORY_CARE_PROVIDER_SITE_OTHER): Payer: Medicare HMO

## 2022-03-10 ENCOUNTER — Encounter: Payer: Self-pay | Admitting: Family Medicine

## 2022-03-10 DIAGNOSIS — I480 Paroxysmal atrial fibrillation: Secondary | ICD-10-CM | POA: Diagnosis not present

## 2022-03-10 DIAGNOSIS — Z952 Presence of prosthetic heart valve: Secondary | ICD-10-CM | POA: Diagnosis not present

## 2022-03-10 LAB — POCT INR
INR: 4.1 — AB (ref 2.0–3.0)
PT: 49.5

## 2022-03-11 DIAGNOSIS — Z96651 Presence of right artificial knee joint: Secondary | ICD-10-CM | POA: Diagnosis not present

## 2022-03-16 ENCOUNTER — Other Ambulatory Visit: Payer: Self-pay | Admitting: Family Medicine

## 2022-03-16 ENCOUNTER — Other Ambulatory Visit: Payer: Self-pay | Admitting: Internal Medicine

## 2022-03-23 ENCOUNTER — Encounter: Payer: Self-pay | Admitting: Internal Medicine

## 2022-03-23 ENCOUNTER — Ambulatory Visit (INDEPENDENT_AMBULATORY_CARE_PROVIDER_SITE_OTHER): Payer: Medicare HMO | Admitting: Internal Medicine

## 2022-03-23 VITALS — BP 120/68 | HR 68 | Ht 62.0 in | Wt 225.0 lb

## 2022-03-23 DIAGNOSIS — I2510 Atherosclerotic heart disease of native coronary artery without angina pectoris: Secondary | ICD-10-CM | POA: Diagnosis not present

## 2022-03-23 DIAGNOSIS — Z952 Presence of prosthetic heart valve: Secondary | ICD-10-CM | POA: Diagnosis not present

## 2022-03-23 DIAGNOSIS — I255 Ischemic cardiomyopathy: Secondary | ICD-10-CM

## 2022-03-23 DIAGNOSIS — Z95810 Presence of automatic (implantable) cardiac defibrillator: Secondary | ICD-10-CM

## 2022-03-23 DIAGNOSIS — I480 Paroxysmal atrial fibrillation: Secondary | ICD-10-CM | POA: Diagnosis not present

## 2022-03-23 DIAGNOSIS — I5022 Chronic systolic (congestive) heart failure: Secondary | ICD-10-CM | POA: Diagnosis not present

## 2022-03-23 NOTE — Patient Instructions (Signed)
Medication Instructions:  - Your physician recommends that you continue on your current medications as directed. Please refer to the Current Medication list given to you today.  *If you need a refill on your cardiac medications before your next appointment, please call your pharmacy*   Lab Work: - none ordered  If you have labs (blood work) drawn today and your tests are completely normal, you will receive your results only by: MyChart Message (if you have MyChart) OR A paper copy in the mail If you have any lab test that is abnormal or we need to change your treatment, we will call you to review the results.   Testing/Procedures: - none ordered   Follow-Up: At [facility], you and your health needs are our priority.  As part of our continuing mission to provide you with exceptional heart care, we have created designated Provider Care Teams.  These Care Teams include your primary Cardiologist (physician) and Advanced Practice Providers (APPs -  Physician Assistants and Nurse Practitioners) who all work together to provide you with the care you need, when you need it.  We recommend signing up for the patient portal called "MyChart".  Sign up information is provided on this After Visit Summary.  MyChart is used to connect with patients for Virtual Visits (Telemedicine).  Patients are able to view lab/test results, encounter notes, upcoming appointments, etc.  Non-urgent messages can be sent to your provider as well.   To learn more about what you can do with MyChart, go to [email or web address].    Your next appointment:   1 year(s)  The format for your next appointment:   In Person  Provider:   Steven Klein, MD    Other Instructions N/a  Important Information About Sugar       

## 2022-03-23 NOTE — Progress Notes (Signed)
Patient Care Team: Virginia Crews, MD as PCP - General (Family Medicine) End, Harrell Gave, MD as PCP - Cardiology (Cardiology) Sindy Guadeloupe, MD as Consulting Physician (Oncology) Sindy Guadeloupe, MD as Consulting Physician (Oncology) Noreene Filbert, MD as Consulting Physician (Radiation Oncology) Lovell Sheehan, MD as Consulting Physician (Orthopedic Surgery)   HPI  Vanessa Carlson is a 61 y.o. female Seen in follow-up for ICD implanted for primary prevention in the setting of a prior anterior wall MI complicated by shock and pericarditis (UVA).  She has a history of remote mitral valve replacement-mechanical -2006 and is anticoagulated with Coumadin with adjunctive aspirin and aware of SBE prophylaxis  2019 was diagnosed with lung cancer and underwent radiation therapy--with further evolution more expansive radiation 2022 again in the right upper lung and was seen most recently by oncology 5/23 with stable scans  The patient denies chest pain,  nocturnal dyspnea, orthopnea or peripheral edema.  There have been no palpitations, lightheadedness or syncope. Mild shortness of breath,  DATE TEST  EF    3/16 echo 35 %    3/17 echo  25 %     6/23 Echo   25-30%      Date Cr K Hgb  4/21 0.97 4.8 12.8 (1/21)  2/23  1.12 4.2 12.7    d   Past Medical History:  Diagnosis Date   AICD (automatic cardioverter/defibrillator) present    Anterior myocardial infarction ([facility])    a. 10/2014 - occluded LAD, complicated by cardiogenic shock-->Med Rx as interventional team was unable to open LAD.   Arthritis    arthirits of knee on right   Cancer of upper lobe of right lung ([place]) 09/2017   radiation therapy right side   Cervical cancer ([facility])    in remission for ~20 yrs, s/p hysterectomy   Chronic combined systolic and diastolic CHF (congestive heart failure) ([place])    a. 10/2015 Echo: EF 20-25%, sev diast dysfxn, mildly reduced RV fxn. Nl MV prosthesis; b. 12/2017 Echo: EF 20-25%,  diff HK. Sev ant/antsept HK, apical AK. Mild MS (mean graad 69mmHg). Mod TR. PASP 17mmHg.   Coronary artery disease    a. 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   Essential hypertension    Ischemic cardiomyopathy    a. 03/2015 s/p MDT single lead AICD;  b. 10/2015 Echo: EF 20-25%;  c. 12/2017 Echo: EF 20-25%, diff HK.   Mass of upper lobe of right lung    a. noted on CXR & CT 08/2017 w/ abnl PET CT.   Mitral valve disease    a. 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 12/2017 Echo: Mild MS. Mean gradient 70mmHg.   Personal history of radiation therapy    f/u lung ca    Past Surgical History:  Procedure Laterality Date   APPENDECTOMY  1998   BREAST EXCISIONAL BIOPSY Left 80s   benign   CARDIAC CATHETERIZATION     CARDIAC DEFIBRILLATOR PLACEMENT     COLONOSCOPY WITH PROPOFOL N/A 01/11/2020   Procedure: COLONOSCOPY WITH PROPOFOL;  Surgeon: Jonathon Bellows, MD;  Location: [facility] ENDOSCOPY;  Service: Gastroenterology;  Laterality: N/A;   RADICAL HYSTERECTOMY  1998   REPLACEMENT TOTAL KNEE Right 12/10/2021   SHOULDER SURGERY Bilateral    rotator cuff tears   VALVE REPLACEMENT     Mitral valve; 27 mm [employer] bileaflet valve  Current Meds  Medication Sig   aspirin 81 MG chewable tablet Chew 81 mg by mouth daily.   atorvastatin (LIPITOR) 80 MG tablet TAKE 1 TABLET BY MOUTH ONCE DAILY. OFFICE VISIT AND LABS NEEDED FOR FURTHER REFILLS.   cyclobenzaprine (FLEXERIL) 5 MG tablet Take 1 tablet (5 mg total) by mouth 3 (three) times daily as needed for muscle spasms.   ENTRESTO 24-26 MG TAKE 1 TABLET BY MOUTH TWICE DAILY *SCHEDULE OFFICE VISIT FOR FURTHER REFILLS*   ezetimibe (ZETIA) 10 MG tablet Take 1 tablet (10 mg total) by mouth daily. PATIENT MUST KEEP APPOINTMENT FOR FUTURE REFILLS.   fluticasone (FLONASE) 50 MCG/ACT nasal spray Place 2 sprays into both nostrils as needed  for allergies or rhinitis.   furosemide (LASIX) 40 MG tablet Take 2 tablets (80 mg total) by mouth 2 (two) times daily.   gabapentin (NEURONTIN) 300 MG capsule Take 1 capsule (300 mg total) by mouth 2 (two) times daily.   metoprolol succinate (TOPROL-XL) 25 MG 24 hr tablet TAKE 1 TABLET BY MOUTH TWICE DAILY IN THE MORNING AND AT BEDTIME   nitroGLYCERIN (NITROSTAT) 0.3 MG SL tablet Place 1 tablet (0.3 mg total) under the tongue every 5 (five) minutes as needed for chest pain.   spironolactone (ALDACTONE) 25 MG tablet TAKE 1 TABLET BY MOUTH ONCE DAILY. APPOINTMENT NEEDED FOR FURTHER REFILLS.   traMADol (ULTRAM) 50 MG tablet TAKE 1 TABLET BY MOUTH EVERY 6 HOURS AS NEEDED   traZODone (DESYREL) 50 MG tablet Take 0.5-1 tablets (25-50 mg total) by mouth at bedtime as needed for sleep.   Vibegron (GEMTESA) 75 MG TABS Take 75 mg by mouth daily.   warfarin (COUMADIN) 10 MG tablet Take 5 mg by mouth daily. Takes on Saturdays   warfarin (COUMADIN) 7.5 MG tablet TAKE 1 TABLET BY MOUTH ONCE DAILY AT 4 PM. TAKE 1 TABLET BY MOUTH DAILY EXCEPT SATURDAYS    No Known Allergies    Review of Systems negative except from HPI and PMH BP 120/68 (BP Location: Left Arm, Patient Position: Sitting, Cuff Size: Large)   Pulse 68   Ht 5\' 2"  (1.575 m)   Wt 225 lb (102.1 kg)   LMP  (LMP Unknown)   SpO2 97%   BMI 41.15 kg/m   Well developed and well nourished in no acute distress HENT normal Neck supple with JVP-flat Clear Device pocket well healed; without hematoma or erythema.  There is no tethering  Regular rate and rhythm, mechanical S1 and a 2/6 systolic   murmur Abd-soft with active BS No Clubbing cyanosis  edema Skin-warm and dry A & Oriented  Grossly normal sensory and motor function  ECG sinus at 68 Interval 16/08/41 Nonspecific T wave changes  CrCl cannot be calculated (Patient's most recent lab result is older than the maximum 21 days allowed.).   Assessment and  Plan Ischemic cardiomyopathy  prior MI  Mitral valve replacement-mechanical  Implantable defibrillator-[employer]onic   Congestive heart failure-chronic-systolic  Lung cancer-remission  VT nonsustained new   Euvolemic.  Continue her on her Lasix 80 twice daily.  With her cardiomyopathy, with pain carvedilol 25 twice daily spironolactone 25 Entresto 24/26  Cancer is currently stable without radiation therapy in the last year; repeat scans are due   Device function is normal. Programming changes   See Paceart for details

## 2022-03-24 ENCOUNTER — Ambulatory Visit (INDEPENDENT_AMBULATORY_CARE_PROVIDER_SITE_OTHER): Payer: Medicare HMO

## 2022-03-24 ENCOUNTER — Encounter: Payer: Self-pay | Admitting: Family Medicine

## 2022-03-24 ENCOUNTER — Encounter: Payer: Self-pay | Admitting: Internal Medicine

## 2022-03-24 DIAGNOSIS — I480 Paroxysmal atrial fibrillation: Secondary | ICD-10-CM

## 2022-03-24 DIAGNOSIS — Z952 Presence of prosthetic heart valve: Secondary | ICD-10-CM | POA: Diagnosis not present

## 2022-03-24 LAB — POCT INR
INR: 1.2 — AB (ref 2.0–3.0)
PT: 14.5

## 2022-03-24 NOTE — Patient Instructions (Signed)
Description   Take 2 (two) of 5 mg today only. Then take 5 mg M-W-F except 7.5mg  daily. F/U in 2 wks.

## 2022-03-29 ENCOUNTER — Ambulatory Visit (INDEPENDENT_AMBULATORY_CARE_PROVIDER_SITE_OTHER): Payer: Medicare HMO

## 2022-03-29 DIAGNOSIS — Z95810 Presence of automatic (implantable) cardiac defibrillator: Secondary | ICD-10-CM

## 2022-03-29 DIAGNOSIS — I5022 Chronic systolic (congestive) heart failure: Secondary | ICD-10-CM | POA: Diagnosis not present

## 2022-03-31 NOTE — Progress Notes (Signed)
EPIC Encounter for ICM Monitoring  Patient Name: Vanessa Carlson is a 61 y.o. female Date: 03/31/2022 Primary Care Physican: Virginia Crews, MD Primary Cardiologist: Carlis Abbott, NP HF Electrophysiologist: Caryl Comes 02/04/2022 Office Weight: 228 lbs 02/24/2022 Weight: 226 lbs         Transmission reviewed.   Optivol thoracic impedance suggesting normal fluid levels.    Prescribed:  Furosemide 40 mg 2 tablets (80 mg total) by mouth two times daily Spironolactone 25 mg take 1 tablet daily   Labs: 10/02/2021 Creatinine 1.12, BUN 14, Potassium 4.2, Sodium 137, GFR 56 A complete set of results can be found in Results Review.   Recommendations:  No changes.    Follow-up plan: ICM clinic phone appointment on 05/04/2022.   91 day device clinic remote transmission 04/06/2022.       EP/Cardiology Office Visits:  Recall 03/23/2023 with Dr. Caryl Comes.  07/07/2022 with Dr End.   Copy of ICM check sent to Dr. Caryl Comes.   3 month ICM trend: 03/29/2022.    12-14 Month ICM trend:     [name], RN 03/31/2022 2:49 PM

## 2022-04-06 ENCOUNTER — Ambulatory Visit (INDEPENDENT_AMBULATORY_CARE_PROVIDER_SITE_OTHER): Payer: Medicare HMO

## 2022-04-06 DIAGNOSIS — I255 Ischemic cardiomyopathy: Secondary | ICD-10-CM | POA: Diagnosis not present

## 2022-04-06 LAB — CUP PACEART REMOTE DEVICE CHECK
Battery Remaining Longevity: 56 mo
Battery Voltage: 2.98 V
Brady Statistic RV Percent Paced: 0 %
Date Time Interrogation Session: 20230808022823
HighPow Impedance: 86 Ohm
Implantable Lead Implant Date: 20160802
Implantable Lead Location: 753860
Implantable Pulse Generator Implant Date: 20160802
Lead Channel Impedance Value: 551 Ohm
Lead Channel Impedance Value: 646 Ohm
Lead Channel Pacing Threshold Amplitude: 0.375 V
Lead Channel Pacing Threshold Pulse Width: 0.4 ms
Lead Channel Sensing Intrinsic Amplitude: 14.375 mV
Lead Channel Sensing Intrinsic Amplitude: 14.375 mV
Lead Channel Setting Pacing Amplitude: 2 V
Lead Channel Setting Pacing Pulse Width: 0.4 ms
Lead Channel Setting Sensing Sensitivity: 0.3 mV

## 2022-04-07 ENCOUNTER — Ambulatory Visit (INDEPENDENT_AMBULATORY_CARE_PROVIDER_SITE_OTHER): Payer: Medicare HMO

## 2022-04-07 DIAGNOSIS — I480 Paroxysmal atrial fibrillation: Secondary | ICD-10-CM | POA: Diagnosis not present

## 2022-04-07 DIAGNOSIS — Z952 Presence of prosthetic heart valve: Secondary | ICD-10-CM

## 2022-04-07 LAB — POCT INR
INR: 4.8 — AB (ref 2.0–3.0)
PT: 57.9

## 2022-04-07 NOTE — Patient Instructions (Signed)
Description   Hold for 2 days then take 5 mg daily except 7.5 mg on Mon and Fri F/U in 2 weeks

## 2022-04-15 ENCOUNTER — Other Ambulatory Visit: Payer: Self-pay | Admitting: Family Medicine

## 2022-04-21 ENCOUNTER — Ambulatory Visit (INDEPENDENT_AMBULATORY_CARE_PROVIDER_SITE_OTHER): Payer: Medicare HMO

## 2022-04-21 DIAGNOSIS — I480 Paroxysmal atrial fibrillation: Secondary | ICD-10-CM | POA: Diagnosis not present

## 2022-04-21 DIAGNOSIS — Z952 Presence of prosthetic heart valve: Secondary | ICD-10-CM

## 2022-04-21 LAB — POCT INR
INR: 2.7 (ref 2.0–3.0)
PT: 32.7

## 2022-04-21 NOTE — Patient Instructions (Signed)
Description   5 mg daily except 7.5 mg on Mon and Fri F/U in 3 weeks

## 2022-04-22 NOTE — Progress Notes (Signed)
I,Sha'taria Tyson,acting as a Education administrator for [employer], PA-C.,have documented all relevant documentation on the behalf of Mikey Kirschner, PA-C,as directed by  Mikey Kirschner, PA-C while in the presence of Mikey Kirschner, PA-C.   Established patient visit   Patient: Vanessa Carlson   DOB: [birth year]   61 y.o. Female  MRN: [id number] Visit Date: 04/23/2022  Today's healthcare provider: Mikey Kirschner, PA-C   Cc. Spot on right hip, dizziness, tinnitus  Subjective    HPI   Pt reports a spot on her skin that has been present for the last few weeks, states it looks like a burn. Denies pain, discharge. Appeared red at first and now almost scabbed over. Denies injury.   She also reports increased dizziness/tinnitus offhand, states she has dealt with this for a while.  Denies falls, syncope, vision changes.  Medications: Outpatient Medications Prior to Visit  Medication Sig   aspirin 81 MG chewable tablet Chew 81 mg by mouth daily.   atorvastatin (LIPITOR) 80 MG tablet TAKE 1 TABLET BY MOUTH ONCE DAILY. OFFICE VISIT AND LABS NEEDED FOR FURTHER REFILLS.   cyclobenzaprine (FLEXERIL) 5 MG tablet Take 1 tablet (5 mg total) by mouth 3 (three) times daily as needed for muscle spasms.   ENTRESTO 24-26 MG TAKE 1 TABLET BY MOUTH TWICE DAILY *SCHEDULE OFFICE VISIT FOR FURTHER REFILLS*   ezetimibe (ZETIA) 10 MG tablet Take 1 tablet (10 mg total) by mouth daily. PATIENT MUST KEEP APPOINTMENT FOR FUTURE REFILLS.   fluticasone (FLONASE) 50 MCG/ACT nasal spray Place 2 sprays into both nostrils as needed for allergies or rhinitis.   furosemide (LASIX) 40 MG tablet Take 2 tablets (80 mg total) by mouth 2 (two) times daily.   gabapentin (NEURONTIN) 300 MG capsule Take 1 capsule (300 mg total) by mouth 2 (two) times daily.   metoprolol succinate (TOPROL-XL) 25 MG 24 hr tablet TAKE 1 TABLET BY MOUTH TWICE DAILY IN THE MORNING AND AT BEDTIME   nitroGLYCERIN (NITROSTAT) 0.3 MG SL tablet Place 1 tablet (0.3  mg total) under the tongue every 5 (five) minutes as needed for chest pain.   spironolactone (ALDACTONE) 25 MG tablet TAKE 1 TABLET BY MOUTH ONCE DAILY. APPOINTMENT NEEDED FOR FURTHER REFILLS.   traMADol (ULTRAM) 50 MG tablet TAKE 1 TABLET BY MOUTH EVERY 6 HOURS AS NEEDED   Vibegron (GEMTESA) 75 MG TABS Take 75 mg by mouth daily.   warfarin (COUMADIN) 10 MG tablet Take 5 mg by mouth daily. Takes on Saturdays   warfarin (COUMADIN) 7.5 MG tablet TAKE 1 TABLET BY MOUTH ONCE DAILY AT 4 PM. TAKE 1 TABLET BY MOUTH DAILY EXCEPT SATURDAYS   traZODone (DESYREL) 50 MG tablet Take 0.5-1 tablets (25-50 mg total) by mouth at bedtime as needed for sleep.   No facility-administered medications prior to visit.    Review of Systems  Constitutional:  Negative for fatigue and fever.  Respiratory:  Negative for cough and shortness of breath.   Cardiovascular:  Negative for chest pain and leg swelling.  Gastrointestinal:  Negative for abdominal pain.  Skin:  Positive for color change.  Neurological:  Positive for dizziness. Negative for headaches.       Objective    Blood pressure (!) 141/67, pulse 80, height 5' 2.5" (1.588 m), weight 227 lb (103 kg), SpO2 100 %.   Physical Exam Vitals reviewed.  Constitutional:      Appearance: She is not ill-appearing.  HENT:     Head: Normocephalic.  Eyes:  Conjunctiva/sclera: Conjunctivae normal.  Cardiovascular:     Rate and Rhythm: Normal rate.  Pulmonary:     Effort: Pulmonary effort is normal. No respiratory distress.  Skin:    Comments: R lateral hip with a 1 cm round hyperpigmented skin lesion  Neurological:     General: No focal deficit present.     Mental Status: She is alert and oriented to person, place, and time.  Psychiatric:        Mood and Affect: Mood normal.        Behavior: Behavior normal.     No results found for any visits on 04/23/22.  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      I, Mikey Kirschner, PA-C have reviewed all documentation for this visit. The documentation on  04/23/2022 for the exam, diagnosis, procedures, and orders are all accurate and complete.Mikey Kirschner, PA-C [facility] [address] [place], [place], [address] Office: [phone] Fax: [place]

## 2022-04-23 ENCOUNTER — Encounter: Payer: Self-pay | Admitting: Physician Assistant

## 2022-04-23 ENCOUNTER — Ambulatory Visit (INDEPENDENT_AMBULATORY_CARE_PROVIDER_SITE_OTHER): Payer: Medicare HMO | Admitting: Physician Assistant

## 2022-04-23 VITALS — BP 141/67 | HR 80 | Ht 62.5 in | Wt 227.0 lb

## 2022-04-23 DIAGNOSIS — H9311 Tinnitus, right ear: Secondary | ICD-10-CM | POA: Diagnosis not present

## 2022-04-23 DIAGNOSIS — L989 Disorder of the skin and subcutaneous tissue, unspecified: Secondary | ICD-10-CM | POA: Insufficient documentation

## 2022-04-23 MED ORDER — TRIAMCINOLONE ACETONIDE 0.1 % EX CREA: 1.0000 | TOPICAL_CREAM | Freq: Two times a day (BID) | CUTANEOUS | 0 refills | Status: DC

## 2022-04-23 NOTE — Assessment & Plan Note (Signed)
Taught pt epley maneuver in office advised she attempt at home If no improvement would ref pt to ent /audiology

## 2022-04-23 NOTE — Assessment & Plan Note (Signed)
Atopic derm vs Seborrhoeic keratosis rx triamcinolone if any change, bleeding, return to office

## 2022-04-26 ENCOUNTER — Ambulatory Visit (INDEPENDENT_AMBULATORY_CARE_PROVIDER_SITE_OTHER): Payer: Medicare HMO | Admitting: Urology

## 2022-04-26 ENCOUNTER — Other Ambulatory Visit: Payer: Self-pay | Admitting: Family Medicine

## 2022-04-26 ENCOUNTER — Encounter: Payer: Self-pay | Admitting: Urology

## 2022-04-26 VITALS — BP 123/78 | HR 87 | Ht 62.5 in | Wt 227.0 lb

## 2022-04-26 DIAGNOSIS — N3946 Mixed incontinence: Secondary | ICD-10-CM

## 2022-04-26 NOTE — Addendum Note (Signed)
Addended by: Despina Hidden on: 04/26/2022 10:46 AM   Modules accepted: Orders

## 2022-04-26 NOTE — Progress Notes (Signed)
04/26/2022 10:27 AM   Shirlean Mylar Jacelyn Grip 08-Dec-1960 [id number]  Referring provider: Virginia Crews, [place],  [place] [address]  Chief Complaint  Patient presents with   Urinary Incontinence   Follow-up    HPI: I was consulted to assess the patient's urinary incontinence.  She leaks with coughing sneezing bending lifting.  She is urge incontinence.  Both are significant.  She wears 1 or 2 pads a day that are moderately wet.  No bedwetting.  She voids every 1 or 2 hours gets up twice at night with a good flow  She has had a hysterectomy.     Patient has mild mixed incontinence.  She has frequency and nocturia.  Reassess in 6 weeks on Myrbetriq 50 mg samples and prescription for cystoscopy.  May need urodynamics in the future.  She understood the concept of the test but I did not go over detail   Last culture positive. Mild improvement on Myrbetriq.  Clinically not infected today On pelvic examination patient had mild grade 2 hypermobility the bladder neck negative cough test no prolapse Cystoscopy: Urine was a little bit cloudy and I sent it for culture and will call if positive     Patient has mild mixed incontinence.  Reassess in 5 weeks on Gemtesa samples.  If patient still leaking order urodynamics and I will explain the test in more detail but was mention today.  Call if culture positive.  If culture positive consider suppression therapy at least for 6 months   Today Frequency stable Last culture negative. Urge incontinence dramatically better.  Less frequency and urgency.  Clinically not infected    Today Patient says medicine is no longer working.  She still leaks with coughing sneezing.  Still has urge incontinence.  Both are significant.  Clinically not infected wearing 2 pads a day moderately wet   PMH: Past Medical History:  Diagnosis Date   AICD (automatic cardioverter/defibrillator) present    Anterior myocardial infarction  ([place])    a. 10/2014 - occluded LAD, complicated by cardiogenic shock-->Med Rx as interventional team was unable to open LAD.   Arthritis    arthirits of knee on right   Cancer of upper lobe of right lung ([facility]) 09/2017   radiation therapy right side   Cervical cancer (H[facility])    in remission for ~20 yrs, s/p hysterectomy   Chronic combined systolic and diastolic CHF (congestive heart failure) ([facility])    a. 10/2015 Echo: EF 20-25%, sev diast dysfxn, mildly reduced RV fxn. Nl MV prosthesis; b. 12/2017 Echo: EF 20-25%, diff HK. Sev ant/antsept HK, apical AK. Mild MS (mean graad 80mmHg). Mod TR. PASP 25mmHg.   Coronary artery disease    a. 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   Essential hypertension    Ischemic cardiomyopathy    a. 03/2015 s/p MDT single lead AICD;  b. 10/2015 Echo: EF 20-25%;  c. 12/2017 Echo: EF 20-25%, diff HK.   Mass of upper lobe of right lung    a. noted on CXR & CT 08/2017 w/ abnl PET CT.   Mitral valve disease    a. 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 12/2017 Echo: Mild MS. Mean gradient 67mmHg.   Personal history of radiation therapy    f/u lung ca    Surgical History: Past Surgical History:  Procedure Laterality Date   APPENDECTOMY  1998   BREAST EXCISIONAL BIOPSY Left 80s   benign   CARDIAC CATHETERIZATION     CARDIAC DEFIBRILLATOR PLACEMENT     COLONOSCOPY WITH PROPOFOL N/A 01/11/2020   Procedure: COLONOSCOPY WITH PROPOFOL;  Surgeon: Jonathon Bellows, MD;  Location: [facility] ENDOSCOPY;  Service: Gastroenterology;  Laterality: N/A;   RADICAL HYSTERECTOMY  1998   REPLACEMENT TOTAL KNEE Right 12/10/2021   SHOULDER SURGERY Bilateral    rotator cuff tears   VALVE REPLACEMENT     Mitral valve; 27 mm St. Jude bileaflet valve    Home Medications:  Allergies as of 04/26/2022   No Known Allergies      Medication List        Accurate as of  April 26, 2022 10:27 AM. If you have any questions, ask your nurse or doctor.          STOP taking these medications    traZODone 50 MG tablet Commonly known as: DESYREL Stopped by: Reece Packer, MD       TAKE these medications    aspirin 81 MG chewable tablet Chew 81 mg by mouth daily.   atorvastatin 80 MG tablet Commonly known as: LIPITOR TAKE 1 TABLET BY MOUTH ONCE DAILY. OFFICE VISIT AND LABS NEEDED FOR FURTHER REFILLS.   cyclobenzaprine 5 MG tablet Commonly known as: FLEXERIL Take 1 tablet (5 mg total) by mouth 3 (three) times daily as needed for muscle spasms.   Entresto 24-26 MG Generic drug: sacubitril-valsartan TAKE 1 TABLET BY MOUTH TWICE DAILY *SCHEDULE OFFICE VISIT FOR FURTHER REFILLS*   ezetimibe 10 MG tablet Commonly known as: ZETIA Take 1 tablet (10 mg total) by mouth daily. PATIENT MUST KEEP APPOINTMENT FOR FUTURE REFILLS.   fluticasone 50 MCG/ACT nasal spray Commonly known as: FLONASE Place 2 sprays into both nostrils as needed for allergies or rhinitis.   furosemide 40 MG tablet Commonly known as: LASIX Take 2 tablets (80 mg total) by mouth 2 (two) times daily.   gabapentin 300 MG capsule Commonly known as: NEURONTIN Take 1 capsule (300 mg total) by mouth 2 (two) times daily.   Gemtesa 75 MG Tabs Generic drug: Vibegron Take 75 mg by mouth daily.   metoprolol succinate 25 MG 24 hr tablet Commonly known as: TOPROL-XL TAKE 1 TABLET BY MOUTH TWICE DAILY IN THE MORNING AND AT BEDTIME   nitroGLYCERIN 0.3 MG SL tablet Commonly known as: NITROSTAT Place 1 tablet (0.3 mg total) under the tongue every 5 (five) minutes as needed for chest pain.   spironolactone 25 MG tablet Commonly known as: ALDACTONE TAKE 1 TABLET BY MOUTH ONCE DAILY. APPOINTMENT NEEDED FOR FURTHER REFILLS.   traMADol 50 MG tablet Commonly known as: ULTRAM TAKE 1 TABLET BY MOUTH EVERY 6 HOURS AS NEEDED   triamcinolone cream 0.1 % Commonly known as: KENALOG Apply 1  Application topically 2 (two) times daily. On area of concern right hip   warfarin 10 MG tablet Commonly known as: COUMADIN Take as directed by the anticoagulation clinic. If you are unsure how to take this medication, talk to your nurse or doctor. Original instructions: Take 5 mg by mouth daily. Takes on Saturdays   warfarin 7.5 MG tablet Commonly known as: COUMADIN Take as directed by the anticoagulation clinic. If you are unsure how to take this medication, talk to your nurse or doctor. Original instructions: TAKE 1 TABLET BY MOUTH ONCE DAILY AT 4 PM. TAKE 1 TABLET BY MOUTH DAILY EXCEPT SATURDAYS        Allergies: No  Known Allergies  Family History: Family History  Problem Relation Age of Onset   Heart attack Mother    Hypertension Mother    Diabetes Mother    Emphysema Father        smoker   Diabetes Maternal Grandmother    Kidney disease Maternal Grandmother    Breast cancer Maternal Aunt    Breast cancer Maternal Aunt    Colon cancer Maternal Uncle     Social History:  reports that she quit smoking about 25 years ago. Her smoking use included cigarettes. She has a 4.00 pack-year smoking history. She has been exposed to tobacco smoke. She has never used smokeless tobacco. She reports that she does not drink alcohol and does not use drugs.  ROS:                                        Physical Exam: BP 123/78   Pulse 87   Ht 5' 2.5" (1.588 m)   Wt 103 kg   LMP  (LMP Unknown)   BMI 40.86 kg/m   Constitutional:  Alert and oriented, No acute distress. HEENT: [place] AT, moist mucus membranes.  Trachea midline, no masses.   Laboratory Data: Lab Results  Component Value Date   WBC 5.5 10/02/2021   HGB 12.7 10/02/2021   HCT 38.9 10/02/2021   MCV 88.8 10/02/2021   PLT 186 10/02/2021    Lab Results  Component Value Date   CREATININE 1.12 (H) 10/02/2021    No results found for: "PSA"  No results found for: "TESTOSTERONE"  Lab Results   Component Value Date   HGBA1C 5.9 (H) 12/14/2019    Urinalysis    Component Value Date/Time   COLORURINE YELLOW (A) 05/27/2021 0005   APPEARANCEUR Clear 09/21/2021 1417   LABSPEC 1.014 05/27/2021 0005   PHURINE 5.0 05/27/2021 0005   GLUCOSEU Negative 09/21/2021 1417   HGBUR MODERATE (A) 05/27/2021 0005   BILIRUBINUR Negative 09/21/2021 1417   KETONESUR NEGATIVE 05/27/2021 0005   PROTEINUR Negative 09/21/2021 1417   PROTEINUR 30 (A) 05/27/2021 0005   NITRITE Negative 09/21/2021 1417   NITRITE POSITIVE (A) 05/27/2021 0005   LEUKOCYTESUR Negative 09/21/2021 1417   LEUKOCYTESUR MODERATE (A) 05/27/2021 0005    Pertinent Imaging:   Assessment & Plan: I ordered urodynamics and we will proceed accordingly.  He will be resting to see if her culture is positive and if so if she responded favorably to an antibiotic  1. Mixed incontinence  - Urinalysis, Complete   No follow-ups on file.  Reece Packer, MD  [facility] [address], [place], [place] [address] [phone]

## 2022-05-04 ENCOUNTER — Ambulatory Visit (INDEPENDENT_AMBULATORY_CARE_PROVIDER_SITE_OTHER): Payer: Medicare HMO

## 2022-05-04 DIAGNOSIS — I5022 Chronic systolic (congestive) heart failure: Secondary | ICD-10-CM | POA: Diagnosis not present

## 2022-05-04 DIAGNOSIS — Z95810 Presence of automatic (implantable) cardiac defibrillator: Secondary | ICD-10-CM | POA: Diagnosis not present

## 2022-05-07 NOTE — Progress Notes (Signed)
EPIC Encounter for ICM Monitoring  Patient Name: Vanessa Carlson is a 61 y.o. female Date: 05/07/2022 Primary Care Physican: Virginia Crews, MD Primary Cardiologist: Carlis Abbott, NP HF Electrophysiologist: Caryl Comes 02/04/2022 Office Weight: 228 lbs 02/24/2022 Weight: 226 lbs         Transmission reviewed.   Optivol thoracic impedance suggesting normal fluid levels.    Prescribed:  Furosemide 40 mg 2 tablets (80 mg total) by mouth two times daily Spironolactone 25 mg take 1 tablet daily   Labs: 10/02/2021 Creatinine 1.12, BUN 14, Potassium 4.2, Sodium 137, GFR 56 A complete set of results can be found in Results Review.   Recommendations:  No changes.    Follow-up plan: ICM clinic phone appointment on 06/14/2022.   91 day device clinic remote transmission 11/78/2023.       EP/Cardiology Office Visits:  Recall 03/23/2023 with Dr. Caryl Comes.  07/07/2022 with Dr End.   Copy of ICM check sent to Dr. Caryl Comes.   3 month ICM trend: 05/04/2022.    12-14 Month ICM trend:    [name], RN 05/07/2022 7:42 AM

## 2022-05-11 DIAGNOSIS — N3941 Urge incontinence: Secondary | ICD-10-CM | POA: Diagnosis not present

## 2022-05-11 NOTE — Progress Notes (Signed)
Remote ICD transmission.   

## 2022-05-12 ENCOUNTER — Ambulatory Visit (INDEPENDENT_AMBULATORY_CARE_PROVIDER_SITE_OTHER): Payer: Medicare HMO

## 2022-05-12 DIAGNOSIS — I480 Paroxysmal atrial fibrillation: Secondary | ICD-10-CM | POA: Diagnosis not present

## 2022-05-12 DIAGNOSIS — Z952 Presence of prosthetic heart valve: Secondary | ICD-10-CM | POA: Diagnosis not present

## 2022-05-12 LAB — POCT INR
INR: 3.8 — AB (ref 2.0–3.0)
PT: 45.6

## 2022-05-12 NOTE — Patient Instructions (Signed)
Description   Hold today and then resume; 5 mg daily except 7.5 mg on Mon and Fri F/U in 1 weeks

## 2022-05-14 ENCOUNTER — Other Ambulatory Visit: Payer: Self-pay | Admitting: Family Medicine

## 2022-05-14 ENCOUNTER — Other Ambulatory Visit: Payer: Self-pay | Admitting: Physician Assistant

## 2022-05-14 ENCOUNTER — Encounter: Payer: Self-pay | Admitting: Family Medicine

## 2022-05-14 ENCOUNTER — Encounter: Payer: Self-pay | Admitting: Urology

## 2022-05-14 DIAGNOSIS — Z952 Presence of prosthetic heart valve: Secondary | ICD-10-CM

## 2022-05-14 MED ORDER — WARFARIN SODIUM 5 MG PO TABS: ORAL_TABLET | ORAL | 1 refills | Status: DC

## 2022-05-14 MED ORDER — WARFARIN SODIUM 7.5 MG PO TABS: ORAL_TABLET | ORAL | 1 refills | Status: DC

## 2022-05-16 ENCOUNTER — Encounter: Payer: Self-pay | Admitting: Internal Medicine

## 2022-05-18 ENCOUNTER — Other Ambulatory Visit: Payer: Self-pay | Admitting: Physician Assistant

## 2022-05-18 ENCOUNTER — Encounter: Payer: Self-pay | Admitting: Family Medicine

## 2022-05-18 MED ORDER — TRAMADOL HCL 50 MG PO TABS: 50.0000 mg | ORAL_TABLET | Freq: Four times a day (QID) | ORAL | 0 refills | Status: DC | PRN

## 2022-05-19 ENCOUNTER — Ambulatory Visit (INDEPENDENT_AMBULATORY_CARE_PROVIDER_SITE_OTHER): Payer: Medicare HMO | Admitting: Physician Assistant

## 2022-05-19 DIAGNOSIS — Z952 Presence of prosthetic heart valve: Secondary | ICD-10-CM | POA: Diagnosis not present

## 2022-05-19 NOTE — Patient Instructions (Signed)
Description   5 mg daily except 7.5 mg on Mon F/U in 2 weeks

## 2022-05-20 ENCOUNTER — Encounter: Payer: Self-pay | Admitting: Internal Medicine

## 2022-05-20 ENCOUNTER — Other Ambulatory Visit
Admission: RE | Admit: 2022-05-20 | Discharge: 2022-05-20 | Disposition: A | Discharge status: Home / Self Care | Payer: Medicare HMO | Source: Ambulatory Visit | Attending: Internal Medicine | Admitting: Internal Medicine

## 2022-05-20 ENCOUNTER — Ambulatory Visit: Payer: Medicare HMO | Attending: Internal Medicine | Admitting: Internal Medicine

## 2022-05-20 ENCOUNTER — Telehealth: Payer: Self-pay | Admitting: Internal Medicine

## 2022-05-20 VITALS — BP 100/64 | HR 81 | Ht 62.5 in | Wt 228.0 lb

## 2022-05-20 DIAGNOSIS — Z952 Presence of prosthetic heart valve: Secondary | ICD-10-CM | POA: Diagnosis not present

## 2022-05-20 DIAGNOSIS — I4729 Other ventricular tachycardia: Secondary | ICD-10-CM | POA: Diagnosis not present

## 2022-05-20 DIAGNOSIS — R002 Palpitations: Secondary | ICD-10-CM | POA: Diagnosis not present

## 2022-05-20 DIAGNOSIS — I5023 Acute on chronic systolic (congestive) heart failure: Secondary | ICD-10-CM

## 2022-05-20 DIAGNOSIS — I2510 Atherosclerotic heart disease of native coronary artery without angina pectoris: Secondary | ICD-10-CM

## 2022-05-20 DIAGNOSIS — Z79899 Other long term (current) drug therapy: Secondary | ICD-10-CM

## 2022-05-20 DIAGNOSIS — I480 Paroxysmal atrial fibrillation: Secondary | ICD-10-CM

## 2022-05-20 LAB — CBC WITH DIFFERENTIAL/PLATELET
Abs Immature Granulocytes: 0.01 10*3/uL (ref 0.00–0.07)
Basophils Absolute: 0 10*3/uL (ref 0.0–0.1)
Basophils Relative: 1 %
Eosinophils Absolute: 0.2 10*3/uL (ref 0.0–0.5)
Eosinophils Relative: 4 %
HCT: 40 % (ref 36.0–46.0)
Hemoglobin: 12.6 g/dL (ref 12.0–15.0)
Immature Granulocytes: 0 %
Lymphocytes Relative: 26 %
Lymphs Abs: 1.4 10*3/uL (ref 0.7–4.0)
MCH: 28 pg (ref 26.0–34.0)
MCHC: 31.5 g/dL (ref 30.0–36.0)
MCV: 88.9 fL (ref 80.0–100.0)
Monocytes Absolute: 0.6 10*3/uL (ref 0.1–1.0)
Monocytes Relative: 11 %
Neutro Abs: 3.2 10*3/uL (ref 1.7–7.7)
Neutrophils Relative %: 58 %
Platelets: 186 10*3/uL (ref 150–400)
RBC: 4.5 MIL/uL (ref 3.87–5.11)
RDW: 15.2 % (ref 11.5–15.5)
WBC: 5.4 10*3/uL (ref 4.0–10.5)
nRBC: 0 % (ref 0.0–0.2)

## 2022-05-20 LAB — COMPREHENSIVE METABOLIC PANEL
ALT: 17 U/L (ref 0–44)
AST: 22 U/L (ref 15–41)
Albumin: 4.1 g/dL (ref 3.5–5.0)
Alkaline Phosphatase: 77 U/L (ref 38–126)
Anion gap: 9 (ref 5–15)
BUN: 13 mg/dL (ref 8–23)
CO2: 28 mmol/L (ref 22–32)
Calcium: 9.2 mg/dL (ref 8.9–10.3)
Chloride: 103 mmol/L (ref 98–111)
Creatinine, Ser: 1.15 mg/dL — ABNORMAL HIGH (ref 0.44–1.00)
GFR, Estimated: 54 mL/min — ABNORMAL LOW (ref >60)
Glucose, Bld: 167 mg/dL — ABNORMAL HIGH (ref 70–99)
Potassium: 3.9 mmol/L (ref 3.5–5.1)
Sodium: 140 mmol/L (ref 135–145)
Total Bilirubin: 1.1 mg/dL (ref 0.3–1.2)
Total Protein: 7.9 g/dL (ref 6.5–8.1)

## 2022-05-20 LAB — MAGNESIUM: Magnesium: 2.2 mg/dL (ref 1.7–2.4)

## 2022-05-20 LAB — TSH: TSH: 4.536 u[IU]/mL — ABNORMAL HIGH (ref 0.350–4.500)

## 2022-05-20 LAB — BRAIN NATRIURETIC PEPTIDE: B Natriuretic Peptide: 132.3 pg/mL — ABNORMAL HIGH (ref 0.0–100.0)

## 2022-05-20 MED ORDER — METOLAZONE 2.5 MG PO TABS: ORAL_TABLET | ORAL | 0 refills | Status: DC

## 2022-05-20 MED ORDER — POTASSIUM CHLORIDE CRYS ER 20 MEQ PO TBCR: 40.0000 meq | EXTENDED_RELEASE_TABLET | Freq: Every day | ORAL | 6 refills | Status: DC

## 2022-05-20 NOTE — Telephone Encounter (Signed)
[name], MD  05/20/2022  1:40 PM EDT     Please let Ms. Wescoat know that her labs suggest that she may have a degree of acute on chronic heart failure.  Her renal function and electrolytes are stable.  I recommend that we start metolazone 2.5 mg once a week, which she can start tomorrow morning.  Hopefully this will augment her diuresis.  She should continue her current dose of furosemide.  I also suggest that we start potassium chloride 40 mEq daily.  She will need a stat BMP immediately before her follow-up visit with [place] in 2-3 weeks.  The patient's TSH is slightly elevated.  She should discuss this with Dr. Brita Romp at their next visit.  I will forward it to her for her records as well.

## 2022-05-20 NOTE — Patient Instructions (Signed)
Medication Instructions:  Your Physician recommend you continue on your current medication as directed.    *If you need a refill on your cardiac medications before your next appointment, please call your pharmacy*   Lab Work: Your physician recommends lab work today (CBC, CMP, mg, TSH, BNP) at the [facility]  If you have labs (blood work) drawn today and your tests are completely normal, you will receive your results only by: [place] (if you have MyChart) OR A paper copy in the mail If you have any lab test that is abnormal or we need to change your treatment, we will call you to review the results.   Testing/Procedures: None ordered today   Follow-Up: At [facility], you and your health needs are our priority.  As part of our continuing mission to provide you with exceptional heart care, we have created designated Provider Care Teams.  These Care Teams include your primary Cardiologist (physician) and Advanced Practice Providers (APPs -  Physician Assistants and Nurse Practitioners) who all work together to provide you with the care you need, when you need it.  We recommend signing up for the patient portal called "MyChart".  Sign up information is provided on this After Visit Summary.  MyChart is used to connect with patients for Virtual Visits (Telemedicine).  Patients are able to view lab/test results, encounter notes, upcoming appointments, etc.  Non-urgent messages can be sent to your provider as well.   To learn more about what you can do with MyChart, go to [email or web address].    Your next appointment:   2-3 week(s)  The format for your next appointment:   In Person  Provider:   [name], MD    If you don't hear from the device clinic by tomorrow, please call [phone]

## 2022-05-20 NOTE — Progress Notes (Signed)
Follow-up Outpatient Visit Date: 05/20/2022  Primary Care Provider: Virginia Crews, MD [address] [place] [address]  Chief Complaint: Palpitations and shortness of breath  HPI:  Ms. Doddridge is a 61 y.o. 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  She reached out to [place] earlier this week complaining of increased shortness of breath and frequent palpitations.  She saw Dr. Caryl Comes in [place] clinic in late July, at which time she noted mild shortness of breath that was stable.  Nonsustained ventricular tachycardia was noted on device interrogations.  She was felt to be euvolemic and was maintained on her current regimen of furosemide, carvedilol, spironolactone, and Entresto.  Echocardiogram in June showed stable LVEF of 25-30% with appropriate mechanical mitral valve parameters.  Today, Ms. Cothron been in new reports that she has been feeling more frequent palpitations especially over the last week.  Episodes typically last up to a minute or 2 and are happening 2-3 times a day.  There are no associated symptoms, though she reports that she once took sublingual nitroglycerin during an episode and noticed some improvement.  She has experienced more significant exertional dyspnea over the last several weeks as well as worsening orthopnea.  She now sleeps on 3 pillows and also has occasional PND.  Her weight has been relatively stable though she has been trying to lose some weight.  She does not have any edema but feels like her abdomen is tighter.  She notes occasional sharp chest pain when  she takes a deep breath but otherwise has not had any chest pain.  She denies lightheadedness/syncope.  INRs have been somewhat labile.  She has not had any obvious bleeding.  She reports being compliant with her medications and a low-sodium diet.  --------------------------------------------------------------------------------------------------  Cardiovascular History & Procedures: Cardiovascular Problems: Coronary artery disease status post anterior STEMI (2016) Ischemic cardiomyopathy with chronic systolic heart failure Rheumatic heart disease status post mechanical mitral valve replacement (2006)   Risk Factors: Known coronary artery disease and hypertension   Cath/PCI:  LHC (2016, [place]): Reportedly proximal occlusion of the LAD, which could not be crossed with a wire.   CV Surgery: Mitral valve replacement (2006, [facility]): 27 mm St. Jude bileaflet valve   EP Procedures and Devices: ICD (04/01/15, [facility]): Medtronic single-chamber ICD   Non-Invasive Evaluation(s): TTE (01/28/2022): Mildly dilated LV with normal wall thickness.  LVEF 25-30% with global hypokinesis.  GLS -9%.  Normal RV size and function.  Moderate left atrial enlargement.  Mechanical mitral valve replacement was seen without regurgitation.  Transmitral gradients were not elevated. TTE (01/05/18): Mildly dilated LV.  LVEF 20 to 25% with diffuse hypokinesis and severe hypokinesis of the anterior and anteroseptal myocardium.  Apical akinesis.  Mechanical mitral valve with mean gradient of 4 mmHg.  Mild left atrial enlargement.  Normal RV function.  Moderate TR.  PASP 43 mmHg. TTE (11/20/15): Normal LV size and wall thickness. LVEF 20-25% with severe diastolic dysfunction. Mildly reduced RV contraction. Normal MV prosthesis function.  Recent CV Pertinent Labs: Lab Results  Component Value Date   CHOL 99 04/14/2021   CHOL 126 12/14/2019   HDL 34 (L) 04/14/2021   HDL  48 12/14/2019   LDLCALC 52 04/14/2021   LDLCALC 61 12/14/2019    LDLCALC 66 07/08/2017   TRIG 67 04/14/2021   CHOLHDL 2.9 04/14/2021   INR 3.8 (A) 05/12/2022   INR 1.9 (H) 05/30/2021   K 4.2 10/02/2021   BUN 14 10/02/2021   BUN 14 12/19/2019   CREATININE 1.12 (H) 10/02/2021   CREATININE 1.14 (H) 07/08/2017    Past medical and surgical history were reviewed and updated in EPIC.  Current Meds  Medication Sig   aspirin 81 MG chewable tablet Chew 81 mg by mouth daily.   atorvastatin (LIPITOR) 80 MG tablet TAKE 1 TABLET BY MOUTH ONCE DAILY. OFFICE VISIT AND LABS NEEDED FOR FURTHER REFILLS   cyclobenzaprine (FLEXERIL) 5 MG tablet Take 1 tablet (5 mg total) by mouth 3 (three) times daily as needed for muscle spasms.   ENTRESTO 24-26 MG TAKE 1 TABLET BY MOUTH TWICE DAILY *SCHEDULE OFFICE VISIT FOR FURTHER REFILLS*   ezetimibe (ZETIA) 10 MG tablet Take 1 tablet (10 mg total) by mouth daily. PATIENT MUST KEEP APPOINTMENT FOR FUTURE REFILLS.   fluticasone (FLONASE) 50 MCG/ACT nasal spray Place 2 sprays into both nostrils as needed for allergies or rhinitis.   furosemide (LASIX) 40 MG tablet Take 2 tablets (80 mg total) by mouth 2 (two) times daily.   gabapentin (NEURONTIN) 300 MG capsule Take 1 capsule (300 mg total) by mouth 2 (two) times daily.   metoprolol succinate (TOPROL-XL) 25 MG 24 hr tablet TAKE 1 TABLET BY MOUTH TWICE DAILY IN THE MORNING AND AT BEDTIME   nitroGLYCERIN (NITROSTAT) 0.3 MG SL tablet Place 1 tablet (0.3 mg total) under the tongue every 5 (five) minutes as needed for chest pain.   spironolactone (ALDACTONE) 25 MG tablet Take 1 tablet (25 mg total) by mouth daily.   traMADol (ULTRAM) 50 MG tablet Take 1 tablet (50 mg total) by mouth every 6 (six) hours as needed.   warfarin (COUMADIN) 5 MG tablet 5 mg daily except 7.5 mg on Mon and Fri   warfarin (COUMADIN) 7.5 MG tablet 5 mg daily except 7.5 mg on Mon and Fri    Allergies: Patient has no known allergies.  Social History   Tobacco Use   Smoking status: Former    Packs/day: 1.00     Years: 4.00    Total pack years: 4.00    Types: Cigarettes    Quit date: 1998    Years since quitting: 25.7    Passive exposure: Past   Smokeless tobacco: Never  Vaping Use   Vaping Use: Never used  Substance Use Topics   Alcohol use: No   Drug use: No    Family History  Problem Relation Age of Onset   Heart attack Mother    Hypertension Mother    Diabetes Mother    Emphysema Father        smoker   Diabetes Maternal Grandmother    Kidney disease Maternal Grandmother    Breast cancer Maternal Aunt    Breast cancer Maternal Aunt    Colon cancer Maternal Uncle     Review of Systems: A 12-system review of systems was performed and was negative except as noted in the HPI.  --------------------------------------------------------------------------------------------------  Physical Exam: BP 100/64 (BP Location: Left Arm, Patient Position: Sitting, Cuff Size: Large)   Pulse 81   Ht 5' 2.5" (1.588 m)   Wt 228 lb (103.4 kg)   LMP  (LMP Unknown)   SpO2 96%   BMI 41.04  kg/m   General:  NAD. Neck: No JVD or HJR, though body habitus limits evaluation. Lungs: Clear to auscultation bilaterally without wheezes or crackles. Heart: Regular rate and rhythm with mechanical S1.  No murmurs. Abdomen: Obese but soft. Extremities: Trace pretibial edema bilaterally.  EKG: Normal sinus rhythm with nonspecific T wave changes.  No significant change since 03/23/2022.  Lab Results  Component Value Date   WBC 5.5 10/02/2021   HGB 12.7 10/02/2021   HCT 38.9 10/02/2021   MCV 88.8 10/02/2021   PLT 186 10/02/2021    Lab Results  Component Value Date   NA 137 10/02/2021   K 4.2 10/02/2021   CL 102 10/02/2021   CO2 28 10/02/2021   BUN 14 10/02/2021   CREATININE 1.12 (H) 10/02/2021   GLUCOSE 176 (H) 10/02/2021   ALT 18 10/02/2021    Lab Results  Component Value Date   CHOL 99 04/14/2021   HDL 34 (L) 04/14/2021   LDLCALC 52 04/14/2021   TRIG 67 04/14/2021   CHOLHDL 2.9  04/14/2021    --------------------------------------------------------------------------------------------------  ASSESSMENT AND PLAN: Acute on chronic HFrEF: Ms. Depass reports progressive exertional dyspnea over the last few weeks as well as worsening orthopnea.  Volume exam is challenging on account of her body habitus, though her weight is only up about 3 pounds from her baseline.  Echocardiogram in June showed stable findings with severely reduced LVEF.  Nonetheless, she may require more aggressive diuresis.  I will check a CBC, CMP, magnesium level, and BNP today.  If her electrolytes and renal function are appropriate, we will add metolazone 2.5 mg once a week to augment her diuresis.  If symptoms do not improve, we will need to consider moving forward with right and left heart catheterization.  Continue current regimen of carvedilol, Entresto, and spironolactone.  Borderline low blood pressure today precludes escalation of this regimen or addition of an SGLT2 inhibitor.  Coronary artery disease: No frank angina reported though progressive shortness of breath is concerning.  As above, we will check labs today and attempt to diuresis Ms. Hoffert more aggressively.  If symptoms do not improve, we will need to proceed with catheterization.  Continue secondary prevention with aspirin and statin therapy.  Paroxysmal atrial fibrillation, nonsustained ventricular tachycardia, and palpitations: EKG today shows sinus rhythm.  Most recent device interrogation from last month did not show any significant arrhythmias.  I have reached out to our device clinic to help Ms. Angert perform a device interrogation from home today.  We will also check labs, as above.  Based on interrogation results, I will discuss further testing and treatment options with Dr. Caryl Comes.  Continue current doses of carvedilol and warfarin.  Status post mechanical MVR: No evidence of valve dysfunction on echo in June.  Continue  aspirin and warfarin.  Morbid obesity: BMI remains greater than 40.  I encouraged Ms. Rufus to continue working on weight loss through diet and exercise, though aforementioned symptoms currently limit her functional capacity.  Follow-up: Return to clinic in 2 to 3 weeks.  [name], MD 05/20/2022 8:21 AM

## 2022-05-20 NOTE — Telephone Encounter (Signed)
I spoke with the patient. I have advised her of her lab results and recommendations as per Dr. Saunders Revel:  1) START metolazone 2.5 mg once a week- 30 minutes prior to furosemide (take the 1st dose tomorrow).  2) Continue furosemide at the current dose  3) START potassium 40 meq once daily  4) STAT BMP on 06/11/22 just prior to her follow up with Dr. Saunders Revel  5) TSH slightly elevated>> to PCP to follow up at her next visit  The patient voices understanding of the above results/ recommendations an is agreeable. She was very appreciative of the call.

## 2022-05-21 ENCOUNTER — Ambulatory Visit
Admission: RE | Admit: 2022-05-21 | Discharge: 2022-05-21 | Disposition: A | Discharge status: Home / Self Care | Payer: Medicare HMO | Source: Ambulatory Visit | Attending: Oncology | Admitting: Oncology

## 2022-05-21 ENCOUNTER — Encounter: Payer: Self-pay | Admitting: Internal Medicine

## 2022-05-21 ENCOUNTER — Telehealth: Payer: Self-pay

## 2022-05-21 DIAGNOSIS — Z85118 Personal history of other malignant neoplasm of bronchus and lung: Secondary | ICD-10-CM | POA: Diagnosis not present

## 2022-05-21 DIAGNOSIS — R002 Palpitations: Secondary | ICD-10-CM

## 2022-05-21 DIAGNOSIS — Z08 Encounter for follow-up examination after completed treatment for malignant neoplasm: Secondary | ICD-10-CM | POA: Diagnosis not present

## 2022-05-21 DIAGNOSIS — I480 Paroxysmal atrial fibrillation: Secondary | ICD-10-CM

## 2022-05-21 DIAGNOSIS — R911 Solitary pulmonary nodule: Secondary | ICD-10-CM | POA: Diagnosis not present

## 2022-05-21 DIAGNOSIS — C3490 Malignant neoplasm of unspecified part of unspecified bronchus or lung: Secondary | ICD-10-CM | POA: Diagnosis not present

## 2022-05-21 DIAGNOSIS — J90 Pleural effusion, not elsewhere classified: Secondary | ICD-10-CM | POA: Diagnosis not present

## 2022-05-21 MED ORDER — IOHEXOL 300 MG/ML  SOLN
75.0000 mL | Freq: Once | INTRAMUSCULAR | Status: AC | PRN
  Administered 2022-05-21: 75 mL via INTRAVENOUS

## 2022-05-21 NOTE — Telephone Encounter (Signed)
-----   Message from Damian Leavell, RN sent at 05/20/2022  4:42 PM EDT ----- Regarding: FW: Device interrogation  ----- Message ----- From: [name], MD Sent: 05/20/2022  10:09 AM EDT To: Deboraha Sprang, MD; Damian Leavell, RN; # Subject: RE: Device interrogation                       Hi Sonia Baller,  Thank you for the update.  I would still like to have her device checked today, if possible.  The most serious concern would be if she is having runs of NSVT.  If there is nothing concerning on her interrogation or labs today, we will move forward with placing a ZIO to assess for a-fib or other supraventricular arrhythmias.  Gerald Stabs ----- Message ----- From: Damian Leavell, RN Sent: 05/20/2022   9:17 AM EDT To: [name], MD; Deboraha Sprang, MD; # Subject: RE: Device interrogation                       Hey Dr. Saunders Revel, This patient has a single chamber ICD. It won't pick up any atrial fibrillation, flutter or things in that realm. Her last check from 05/04/22 showed some PVC's at <0.1 per hour.  She may benefit from a ZIO monitor to get a better picture.  Let me know if that makes sense or if you need me to do anything else.  Not sure an additional remote check will add much to her picture.  Sonia Baller RN   ----- Message ----- From: [name], MD Sent: 05/20/2022   8:53 AM EDT To: Deboraha Sprang, MD; Burnis Medin Device Subject: Device interrogation                           Good morning,  I just saw this patient in clinic, and she mentioned that she has been having fairly frequent palpitations.  She has a history of ischemic cardiomyopathy and NSVT.  Would you be able to reach out to her to help her do a transmission from home today?  If you could let Dr. Caryl Comes and me know when the interrogation is complete, I would greatly appreciated.  Please let me know if any questions come up.  Thanks.  Chris End

## 2022-05-21 NOTE — Telephone Encounter (Signed)
Remote transmission received and reviewed. Normal device function. No episodes noted. Please see below for details.

## 2022-05-21 NOTE — Telephone Encounter (Signed)
Called tot give the patient Dr. Darnelle Bos recommendation. Lmtcb.

## 2022-05-21 NOTE — Telephone Encounter (Signed)
Thank you for forwarding this interrogation.  As no episodes of VT/VF were observed on yesterday's interrogation, I recommend placement of a ZIO XT monitor for further evaluation of potential atrial arrhythmias leading to the patient's palpitations.  She should continue with escalated diuresis, as discussed yesterday.  Vanessa Bush, MD [facility]

## 2022-05-24 ENCOUNTER — Ambulatory Visit: Payer: Medicare HMO | Attending: Internal Medicine

## 2022-05-24 ENCOUNTER — Other Ambulatory Visit: Payer: Self-pay | Admitting: *Deleted

## 2022-05-24 ENCOUNTER — Telehealth: Payer: Self-pay | Admitting: *Deleted

## 2022-05-24 DIAGNOSIS — I480 Paroxysmal atrial fibrillation: Secondary | ICD-10-CM

## 2022-05-24 DIAGNOSIS — R002 Palpitations: Secondary | ICD-10-CM

## 2022-05-24 DIAGNOSIS — C3490 Malignant neoplasm of unspecified part of unspecified bronchus or lung: Secondary | ICD-10-CM

## 2022-05-24 NOTE — Progress Notes (Signed)
Per Dr. Janese Banks, pt needs PET scan and US thoracentesis ordered. Pt will follow up at appt tomorrow and appts for PET and thora will be given to patient at that time.

## 2022-05-24 NOTE — Telephone Encounter (Signed)
Called report  IMPRESSION: 1. Significant interval increase in size of a suprahilar mass of the paramedian right upper lobe, consistent with recurrent primary lung malignancy. Postobstructive atelectasis or consolidation of the apical right upper lobe. 2. Multiple new and enlarging small nodules, highly suspicious for small metastases. 3. Slight interval enlargement of prominent pretracheal nodes, concerning for worsened nodal metastatic. 4. New, moderate right pleural effusion, presumably malignant, although without direct evidence of pleural thickening or nodularity. 5. Coronary artery disease.   These results will be called to the ordering clinician or representative by the Radiologist Assistant, and communication documented in the PACS or [employer].   Aortic Atherosclerosis (ICD10-I70.0).     Electronically Signed   By: Delanna Ahmadi M.D.   On: 05/23/2022 20:19

## 2022-05-24 NOTE — Telephone Encounter (Signed)
See 05/21/22 mychart encounter for documentation. Closing this encounter.

## 2022-05-25 ENCOUNTER — Encounter: Payer: Self-pay | Admitting: Oncology

## 2022-05-25 ENCOUNTER — Telehealth: Payer: Self-pay | Admitting: Oncology

## 2022-05-25 ENCOUNTER — Other Ambulatory Visit: Payer: Self-pay | Admitting: *Deleted

## 2022-05-25 ENCOUNTER — Inpatient Hospital Stay: Payer: Medicare HMO | Attending: Oncology | Admitting: Oncology

## 2022-05-25 VITALS — BP 123/68 | HR 83 | Temp 96.7°F | Resp 16 | Wt 222.6 lb

## 2022-05-25 DIAGNOSIS — C3491 Malignant neoplasm of unspecified part of right bronchus or lung: Secondary | ICD-10-CM | POA: Diagnosis not present

## 2022-05-25 DIAGNOSIS — C3431 Malignant neoplasm of lower lobe, right bronchus or lung: Secondary | ICD-10-CM | POA: Diagnosis not present

## 2022-05-25 DIAGNOSIS — Z923 Personal history of irradiation: Secondary | ICD-10-CM | POA: Insufficient documentation

## 2022-05-25 MED ORDER — LORAZEPAM 0.5 MG PO TABS: 0.5000 mg | ORAL_TABLET | ORAL | 0 refills | Status: DC

## 2022-05-25 NOTE — Telephone Encounter (Signed)
Left VM with patient to review upcoming scans. Patient has a PET scan at [facility] and a MRI at [facility] in [place]. Reviewed instructions, day/time and location and requested she call back with any questions she may have.

## 2022-05-25 NOTE — Progress Notes (Signed)
Hematology/Oncology Consult note [facility]  [phone] [phone]  Patient Care Team: Virginia Crews, MD as PCP - General (Family Medicine) End, Harrell Gave, MD as PCP - Cardiology (Cardiology) Sindy Guadeloupe, MD as Consulting Physician (Oncology) Sindy Guadeloupe, MD as Consulting Physician (Oncology) Noreene Filbert, MD as Consulting Physician (Radiation Oncology) Lovell Sheehan, MD as Consulting Physician (Orthopedic Surgery)   Name of the patient: Vanessa Carlson  [id number]  Jan 02, 1961   Date of visit: 05/25/22  Diagnosis- stage I lung cancer s/p SBRT  Chief complaint/ Reason for visit-discuss CT scan results and further management  Heme/Onc history: Patient is a 61 year old female with a past medical history significant for CAD, ischemic cardiomyopathy status post AICD placement, rheumatic heart disease status post mechanical mitral valve replacement on Coumadin.She was found to have a right upper lobe lung mass 2.5 cm that was hypermetabolic on PET CT scan in January 2019.  No evidence of nodal involvement.  She was not deemed to be a candidate for surgery and underwent SBRT to this lesion.  This was biopsy-proven adenocarcinoma with acinar and solid morphologies.  She then had a CT scan in January 2020 which showed enlarging satellite right upper lobe lung nodules.  This was followed by a PET CT scan which showed right upper lobe lung nodules 2 of them were hypermetabolic with an SUV of 8.7 was also a thyroid nodule approximately 4 mm in size which was not definitely hypermetabolic.  Patient was seen by Dr. Donella Stade following this and sh received SBRT for 5 fractions encompassing all these 3 nodules.  Patient has not had any systemic chemotherapy for her lung cancer so far.  There is a gradual increase in the size of her right upper lobe lung nodules in May 2022 and patient received SBRT to this area again by Dr. Donella Stade       Interval history-patient reports ongoing fatigue and exertional shortness of breath.  Her diuretics were adjusted by cardiology.  ECOG PS- 1 Pain scale- 0   Review of systems- Review of Systems  Constitutional:  Positive for malaise/fatigue. Negative for chills, fever and weight loss.  HENT:  Negative for congestion, ear discharge and nosebleeds.   Eyes:  Negative for blurred vision.  Respiratory:  Positive for shortness of breath. Negative for cough, hemoptysis, sputum production and wheezing.   Cardiovascular:  Negative for chest pain, palpitations, orthopnea and claudication.  Gastrointestinal:  Negative for abdominal pain, blood in stool, constipation, diarrhea, heartburn, melena, nausea and vomiting.  Genitourinary:  Negative for dysuria, flank pain, frequency, hematuria and urgency.  Musculoskeletal:  Negative for back pain, joint pain and myalgias.  Skin:  Negative for rash.  Neurological:  Negative for dizziness, tingling, focal weakness, seizures, weakness and headaches.  Endo/Heme/Allergies:  Does not bruise/bleed easily.  Psychiatric/Behavioral:  Negative for depression and suicidal ideas. The patient does not have insomnia.       No Known Allergies   Past Medical History:  Diagnosis Date   AICD (automatic cardioverter/defibrillator) present    Anterior myocardial infarction ([facility])    a. 10/2014 - occluded LAD, complicated by cardiogenic shock-->Med Rx as interventional team was unable to open LAD.   Arthritis    arthirits of knee on right   Cancer of upper lobe of right lung ([place]) 09/2017   radiation therapy right side   Cervical cancer ([facility])    in remission for ~20 yrs, s/p hysterectomy   Chronic combined systolic and diastolic  CHF (congestive heart failure) ([place])    a. 10/2015 Echo: EF 20-25%, sev diast dysfxn, mildly reduced RV fxn. Nl MV prosthesis; b. 12/2017 Echo: EF 20-25%, diff HK. Sev ant/antsept HK, apical AK. Mild MS (mean graad 59mmHg). Mod TR. PASP 32mmHg.    Coronary artery disease    a. 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   Essential hypertension    Ischemic cardiomyopathy    a. 03/2015 s/p MDT single lead AICD;  b. 10/2015 Echo: EF 20-25%;  c. 12/2017 Echo: EF 20-25%, diff HK.   Mass of upper lobe of right lung    a. noted on CXR & CT 08/2017 w/ abnl PET CT.   Mitral valve disease    a. 2006 s/p MVR w/ 19mm SJM bileaflet mechanical valve-->chronic coumadin/ASA; b. 12/2017 Echo: Mild MS. Mean gradient 20mmHg.   Personal history of radiation therapy    f/u lung ca     Past Surgical History:  Procedure Laterality Date   APPENDECTOMY  1998   BREAST EXCISIONAL BIOPSY Left 80s   benign   CARDIAC CATHETERIZATION     CARDIAC DEFIBRILLATOR PLACEMENT     COLONOSCOPY WITH PROPOFOL N/A 01/11/2020   Procedure: COLONOSCOPY WITH PROPOFOL;  Surgeon: Jonathon Bellows, MD;  Location: [facility] ENDOSCOPY;  Service: Gastroenterology;  Laterality: N/A;   RADICAL HYSTERECTOMY  1998   REPLACEMENT TOTAL KNEE Right 12/10/2021   SHOULDER SURGERY Bilateral    rotator cuff tears   VALVE REPLACEMENT     Mitral valve; 27 mm St. Jude bileaflet valve    Social History   Socioeconomic History   Marital status: Married    Spouse name: Tosin   Number of children: 2   Years of education: 14   Highest education level: Some college, no degree  Occupational History   Occupation: disable  Tobacco Use   Smoking status: Former    Packs/day: 1.00    Years: 4.00    Total pack years: 4.00    Types: Cigarettes    Quit date: 1998    Years since quitting: 25.7    Passive exposure: Past   Smokeless tobacco: Never  Vaping Use   Vaping Use: Never used  Substance and Sexual Activity   Alcohol use: No   Drug use: No   Sexual activity: Yes    Partners: Male    Birth control/protection: Surgical  Other Topics Concern   Not on file  Social History Narrative   Not on file   Social  Determinants of Health   Financial Resource Strain: Low Risk  (10/29/2021)   Overall Financial Resource Strain (CARDIA)    Difficulty of Paying Living Expenses: Not hard at all  Food Insecurity: No Food Insecurity (10/29/2021)   Hunger Vital Sign    Worried About Running Out of Food in the Last Year: Never true    Ran Out of Food in the Last Year: Never true  Transportation Needs: No Transportation Needs (10/29/2021)   PRAPARE - Hydrologist (Medical): No    Lack of Transportation (Non-Medical): No  Physical Activity: Insufficiently Active (10/29/2021)   Exercise Vital Sign    Days of Exercise per Week: 2 days    Minutes of Exercise per Session: 20 min  Stress: No Stress Concern Present (10/29/2021)   [place]    Feeling of Stress : Not at all  Social  Connections: Moderately Integrated (10/29/2021)   Social Connection and Isolation Panel [NHANES]    Frequency of Communication with Friends and Family: Three times a week    Frequency of Social Gatherings with Friends and Family: Once a week    Attends Religious Services: More than 4 times per year    Active Member of [employer] or Organizations: No    Attends Archivist Meetings: Never    Marital Status: Married  Human resources officer Violence: Not At Risk (10/29/2021)   Humiliation, Afraid, Rape, and Kick questionnaire    Fear of Current or Ex-Partner: No    Emotionally Abused: No    Physically Abused: No    Sexually Abused: No    Family History  Problem Relation Age of Onset   Heart attack Mother    Hypertension Mother    Diabetes Mother    Emphysema Father        smoker   Diabetes Maternal Grandmother    Kidney disease Maternal Grandmother    Breast cancer Maternal Aunt    Breast cancer Maternal Aunt    Colon cancer Maternal Uncle      Current Outpatient Medications:    aspirin 81 MG chewable tablet, Chew 81 mg by mouth daily., Disp:  , Rfl:    atorvastatin (LIPITOR) 80 MG tablet, TAKE 1 TABLET BY MOUTH ONCE DAILY. OFFICE VISIT AND LABS NEEDED FOR FURTHER REFILLS, Disp: 30 tablet, Rfl: 0   ENTRESTO 24-26 MG, TAKE 1 TABLET BY MOUTH TWICE DAILY *SCHEDULE OFFICE VISIT FOR FURTHER REFILLS*, Disp: 180 tablet, Rfl: 1   ezetimibe (ZETIA) 10 MG tablet, Take 1 tablet (10 mg total) by mouth daily. PATIENT MUST KEEP APPOINTMENT FOR FUTURE REFILLS., Disp: 90 tablet, Rfl: 0   fluticasone (FLONASE) 50 MCG/ACT nasal spray, Place 2 sprays into both nostrils as needed for allergies or rhinitis., Disp: , Rfl:    furosemide (LASIX) 40 MG tablet, Take 2 tablets (80 mg total) by mouth 2 (two) times daily., Disp: 120 tablet, Rfl: 5   gabapentin (NEURONTIN) 300 MG capsule, Take 1 capsule (300 mg total) by mouth 2 (two) times daily., Disp: 180 capsule, Rfl: 3   metolazone (ZAROXOLYN) 2.5 MG tablet, Take 1 tablet (2.5 mg) by mouth once a week. Take 30 minutes prior to your furosemide dose., Disp: 12 tablet, Rfl: 0   metoprolol succinate (TOPROL-XL) 25 MG 24 hr tablet, TAKE 1 TABLET BY MOUTH TWICE DAILY IN THE MORNING AND AT BEDTIME, Disp: 180 tablet, Rfl: 0   nitroGLYCERIN (NITROSTAT) 0.3 MG SL tablet, Place 1 tablet (0.3 mg total) under the tongue every 5 (five) minutes as needed for chest pain., Disp: 30 tablet, Rfl: 3   potassium chloride SA (KLOR-CON M) 20 MEQ tablet, Take 2 tablets (40 mEq total) by mouth daily., Disp: 60 tablet, Rfl: 6   spironolactone (ALDACTONE) 25 MG tablet, Take 1 tablet (25 mg total) by mouth daily., Disp: 90 tablet, Rfl: 3   traMADol (ULTRAM) 50 MG tablet, Take 1 tablet (50 mg total) by mouth every 6 (six) hours as needed., Disp: 30 tablet, Rfl: 0   warfarin (COUMADIN) 5 MG tablet, 5 mg daily except 7.5 mg on Mon and Fri, Disp: 90 tablet, Rfl: 1   warfarin (COUMADIN) 7.5 MG tablet, 5 mg daily except 7.5 mg on Mon and Fri, Disp: 60 tablet, Rfl: 1   cyclobenzaprine (FLEXERIL) 5 MG tablet, Take 1 tablet (5 mg total) by mouth 3  (three) times daily as needed for muscle spasms. (Patient not  taking: Reported on 05/25/2022), Disp: 30 tablet, Rfl: 1   LORazepam (ATIVAN) 0.5 MG tablet, Take 1 tablet (0.5 mg total) by mouth as directed. Pt will take 0.5 tablet 1 hour before the pet and then 0.5 mg 1 hour before mri of brain, Disp: 2 tablet, Rfl: 0  Physical exam:  Vitals:   05/25/22 0917  BP: 123/68  Pulse: 83  Resp: 16  Temp: (!) 96.7 F (35.9 C)  SpO2: 99%  Weight: 222 lb 9.6 oz (101 kg)   Physical Exam Constitutional:      General: She is not in acute distress. Cardiovascular:     Rate and Rhythm: Normal rate and regular rhythm.     Heart sounds: Normal heart sounds.  Pulmonary:     Effort: Pulmonary effort is normal.     Comments: Breath sounds mildly decreased over right lung base Abdominal:     General: Bowel sounds are normal.     Palpations: Abdomen is soft.  Skin:    General: Skin is warm and dry.  Neurological:     Mental Status: She is alert and oriented to person, place, and time.         Latest Ref Rng & Units 05/20/2022    9:14 AM  CMP  Glucose 70 - 99 mg/dL 167   BUN 8 - 23 mg/dL 13   Creatinine 0.44 - 1.00 mg/dL 1.15   Sodium 135 - 145 mmol/L 140   Potassium 3.5 - 5.1 mmol/L 3.9   Chloride 98 - 111 mmol/L 103   CO2 22 - 32 mmol/L 28   Calcium 8.9 - 10.3 mg/dL 9.2   Total Protein 6.5 - 8.1 g/dL 7.9   Total Bilirubin 0.3 - 1.2 mg/dL 1.1   Alkaline Phos 38 - 126 U/L 77   AST 15 - 41 U/L 22   ALT 0 - 44 U/L 17       Latest Ref Rng & Units 05/20/2022    9:14 AM  CBC  WBC 4.0 - 10.5 K/uL 5.4   Hemoglobin 12.0 - 15.0 g/dL 12.6   Hematocrit 36.0 - 46.0 % 40.0   Platelets 150 - 400 K/uL 186     No images are attached to the encounter.  CT Chest W Contrast  Result Date: 05/23/2022 CLINICAL DATA:  Lung cancer restaging, status post SBRT * Tracking Code: BO * EXAM: CT CHEST WITH CONTRAST TECHNIQUE: Multidetector CT imaging of the chest was performed during intravenous contrast  administration. RADIATION DOSE REDUCTION: This exam was performed according to the departmental dose-optimization program which includes automated exposure control, adjustment of the mA and/or kV according to patient size and/or use of iterative reconstruction technique. CONTRAST:  27mL OMNIPAQUE IOHEXOL 300 MG/ML  SOLN COMPARISON:  01/12/2022 FINDINGS: Cardiovascular: Left chest multi lead pacer defibrillator. Aortic atherosclerosis. Mild cardiomegaly. Left and right coronary artery calcifications. Mitral valve prosthesis. No pericardial effusion. Mediastinum/Nodes: Slight interval enlargement of prominent pretracheal nodes, index node measuring 1.2 x 1.2 cm, previously 0.9 x 0.8 cm (series 2, image 47). Thyroid gland, trachea, and esophagus demonstrate no significant findings. Lungs/Pleura: Significant interval increase in size of a suprahilar mass of the paramedian right upper lobe, measuring 6.4 x 4.6 cm, previously 5.5 x 3.6 cm when measured similarly, and with a large interface to the medial pleura and mediastinum (series 2, image 39). Postobstructive atelectasis or consolidation of the apical right upper lobe. Interval enlargement of a left upper lobe nodule measuring 0.6 x 0.5 cm, previously no greater  than 0.4 cm (series 3, image 36). Additional new small nodules in the left apex measuring up to 0.5 cm (series 3, image 25) as well as in the right lower lobe (series 3, image 48). New, moderate right pleural effusion and associated atelectasis or consolidation. Upper Abdomen: No acute abnormality. Musculoskeletal: No acute osseous findings. Unchanged sclerotic fractures of the anterior right second and third ribs (series 2, image 39). IMPRESSION: 1. Significant interval increase in size of a suprahilar mass of the paramedian right upper lobe, consistent with recurrent primary lung malignancy. Postobstructive atelectasis or consolidation of the apical right upper lobe. 2. Multiple new and enlarging small  nodules, highly suspicious for small metastases. 3. Slight interval enlargement of prominent pretracheal nodes, concerning for worsened nodal metastatic. 4. New, moderate right pleural effusion, presumably malignant, although without direct evidence of pleural thickening or nodularity. 5. Coronary artery disease. These results will be called to the ordering clinician or representative by the Radiologist Assistant, and communication documented in the PACS or [employer]. Aortic Atherosclerosis (ICD10-I70.0). Electronically Signed   By: Delanna Ahmadi M.D.   On: 05/23/2022 20:19     Assessment and plan- Patient is a 61 y.o. female with history of stage I right upper lobe lung cancer s/p SBRT here To discuss CT results and further management  I have reviewed CT chest images independently and discussed findings with the patient.  Her prior CT scan was done in May 2023 which did not show any evidence of recurrent or progressive disease.  On the May scan she was noted to have radiation changes in the right upper lobe spanning an area of 5.1 x 2.5 cm.  There was a mildly enlarged right paratracheal node 11 mm noted but had not significantly changed as compared to her prior scan in February 2023.  I will therefore obtain repeat CT chest at this time which shows significant enlargement in the right upper lobe mass now measuring 6.4 x 4.6 cm with interface to medial pleura and mediastinum.  A new left upper lobe lung nodule measuring 0.6 mm as compared to 0.4 cm prior.  New moderate right pleural effusion.  Small nodules in the left apex and right lower lobe.  All these findings were concerning for metastatic disease.  My plan is to obtain a PET CT scan at this time.  She is also getting right-sided thoracentesis tomorrow and we will be sending of specimen for cytology.  If cytology is negative for malignancy she will need bronchoscopy with lymph node sampling as well as right upper lobe mass sampling.  I will see  her back after pathology results are back.  I will also obtain MRI brain with and without contrast to complete her staging work-up   Visit Diagnosis 1. Non-small cell cancer of right lung [facility])      Dr. Randa Evens, MD, MPH [facility] at [facility] [phone] 05/25/2022 12:45 PM

## 2022-05-26 ENCOUNTER — Ambulatory Visit
Admission: RE | Admit: 2022-05-26 | Discharge: 2022-05-26 | Disposition: A | Discharge status: Home / Self Care | Payer: Medicare HMO | Source: Ambulatory Visit | Attending: Physician Assistant | Admitting: Physician Assistant

## 2022-05-26 ENCOUNTER — Other Ambulatory Visit: Payer: Self-pay | Admitting: Physician Assistant

## 2022-05-26 ENCOUNTER — Encounter: Payer: Self-pay | Admitting: Oncology

## 2022-05-26 ENCOUNTER — Ambulatory Visit
Admission: RE | Admit: 2022-05-26 | Discharge: 2022-05-26 | Disposition: A | Discharge status: Home / Self Care | Payer: Medicare HMO | Source: Ambulatory Visit | Attending: Oncology | Admitting: Oncology

## 2022-05-26 DIAGNOSIS — C3490 Malignant neoplasm of unspecified part of unspecified bronchus or lung: Secondary | ICD-10-CM

## 2022-05-26 DIAGNOSIS — C3411 Malignant neoplasm of upper lobe, right bronchus or lung: Secondary | ICD-10-CM | POA: Insufficient documentation

## 2022-05-26 DIAGNOSIS — Z95810 Presence of automatic (implantable) cardiac defibrillator: Secondary | ICD-10-CM | POA: Diagnosis not present

## 2022-05-26 DIAGNOSIS — I480 Paroxysmal atrial fibrillation: Secondary | ICD-10-CM

## 2022-05-26 DIAGNOSIS — R002 Palpitations: Secondary | ICD-10-CM | POA: Diagnosis not present

## 2022-05-26 DIAGNOSIS — Z9889 Other specified postprocedural states: Secondary | ICD-10-CM

## 2022-05-26 DIAGNOSIS — J181 Lobar pneumonia, unspecified organism: Secondary | ICD-10-CM | POA: Diagnosis not present

## 2022-05-26 DIAGNOSIS — J910 Malignant pleural effusion: Secondary | ICD-10-CM | POA: Insufficient documentation

## 2022-05-26 DIAGNOSIS — J90 Pleural effusion, not elsewhere classified: Secondary | ICD-10-CM | POA: Diagnosis not present

## 2022-05-26 LAB — LACTATE DEHYDROGENASE, PLEURAL OR PERITONEAL FLUID: LD, Fluid: 435 U/L — ABNORMAL HIGH (ref 3–23)

## 2022-05-26 LAB — PROTEIN, PLEURAL OR PERITONEAL FLUID: Total protein, fluid: 5.7 g/dL

## 2022-05-26 NOTE — Procedures (Signed)
PROCEDURE SUMMARY:  Successful US guided right thoracentesis. Yielded 550 mL of amber fluid. Patient tolerated procedure well. No immediate complications. EBL = trace  Specimen was sent for labs.  Post procedure chest X-ray pending.   Jerod Mcquain S Ioane Bhola PA-C 05/26/2022 3:02 PM

## 2022-05-27 ENCOUNTER — Other Ambulatory Visit: Payer: Self-pay | Admitting: Oncology

## 2022-05-28 ENCOUNTER — Ambulatory Visit: Payer: Medicare HMO | Admitting: Oncology

## 2022-05-30 LAB — BODY FLUID CULTURE W GRAM STAIN: Culture: NO GROWTH

## 2022-05-31 ENCOUNTER — Other Ambulatory Visit: Payer: Self-pay | Admitting: Pathology

## 2022-05-31 LAB — CYTOLOGY - NON PAP

## 2022-06-02 ENCOUNTER — Ambulatory Visit (INDEPENDENT_AMBULATORY_CARE_PROVIDER_SITE_OTHER): Payer: Medicare HMO

## 2022-06-02 DIAGNOSIS — I480 Paroxysmal atrial fibrillation: Secondary | ICD-10-CM | POA: Diagnosis not present

## 2022-06-02 DIAGNOSIS — Z952 Presence of prosthetic heart valve: Secondary | ICD-10-CM | POA: Diagnosis not present

## 2022-06-02 LAB — POCT INR
INR: 2.3 (ref 2.0–3.0)
PT: 27.9

## 2022-06-02 NOTE — Patient Instructions (Signed)
Description   5 mg daily except 7.5 mg on Mon and Fri F/U in 3 weeks

## 2022-06-03 ENCOUNTER — Other Ambulatory Visit: Payer: Medicare HMO

## 2022-06-03 NOTE — Progress Notes (Signed)
Tumor Board Documentation  Vanessa Carlson was presented by Dr Janese Banks at our Tumor Board on 06/03/2022, which included representatives from medical oncology, radiology, pathology, surgical, pharmacy, pulmonology, radiation oncology, navigation, research, internal medicine, palliative care.  Vanessa Carlson currently presents as a current patient, for new positive pathology, for [place] with history of the following treatments: surgical intervention(s).  Additionally, we reviewed previous medical and familial history, history of present illness, and recent lab results along with all available histopathologic and imaging studies. The tumor board considered available treatment options and made the following recommendations: Chemotherapy, Immunotherapy, Additional screening (PET Scan)    The following procedures/referrals were also placed: No orders of the defined types were placed in this encounter.   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  Tumor board is a meeting of clinicians from various specialty areas who evaluate and discuss patients for whom a multidisciplinary approach is being considered. Final determinations in the plan of care are those of the provider(s). The responsibility for follow up of recommendations given during tumor board is that of the provider.   Today's extended care, comprehensive team conference, [name] was not present for the discussion and was not examined.   Multidisciplinary Tumor Board is a multidisciplinary case peer review process.  Decisions discussed in the Multidisciplinary Tumor Board reflect the opinions of the specialists present at the conference without having examined the patient.  Ultimately, treatment and diagnostic decisions rest with the primary provider(s) and the patient.

## 2022-06-05 ENCOUNTER — Other Ambulatory Visit: Payer: Self-pay | Admitting: Family Medicine

## 2022-06-07 ENCOUNTER — Telehealth: Payer: Self-pay | Admitting: Oncology

## 2022-06-07 ENCOUNTER — Ambulatory Visit: Payer: Medicare HMO

## 2022-06-07 NOTE — Telephone Encounter (Signed)
No she sees me as planned

## 2022-06-07 NOTE — Telephone Encounter (Signed)
Patient would like to know if she needs to come to her appointment tomorrow since the PET was cancelled. Please advise.

## 2022-06-08 ENCOUNTER — Inpatient Hospital Stay: Payer: Medicare HMO | Attending: Oncology | Admitting: Oncology

## 2022-06-08 ENCOUNTER — Encounter: Payer: Self-pay | Admitting: *Deleted

## 2022-06-08 ENCOUNTER — Encounter: Payer: Self-pay | Admitting: Oncology

## 2022-06-08 ENCOUNTER — Other Ambulatory Visit: Payer: Self-pay | Admitting: Internal Medicine

## 2022-06-08 VITALS — BP 107/60 | HR 79 | Temp 98.3°F | Resp 16 | Ht 62.5 in | Wt 226.0 lb

## 2022-06-08 DIAGNOSIS — I2510 Atherosclerotic heart disease of native coronary artery without angina pectoris: Secondary | ICD-10-CM | POA: Insufficient documentation

## 2022-06-08 DIAGNOSIS — Z923 Personal history of irradiation: Secondary | ICD-10-CM | POA: Diagnosis not present

## 2022-06-08 DIAGNOSIS — C3491 Malignant neoplasm of unspecified part of right bronchus or lung: Secondary | ICD-10-CM | POA: Insufficient documentation

## 2022-06-08 DIAGNOSIS — I700 Atherosclerosis of aorta: Secondary | ICD-10-CM | POA: Insufficient documentation

## 2022-06-08 DIAGNOSIS — Z7189 Other specified counseling: Secondary | ICD-10-CM

## 2022-06-08 DIAGNOSIS — Z8541 Personal history of malignant neoplasm of cervix uteri: Secondary | ICD-10-CM | POA: Insufficient documentation

## 2022-06-08 DIAGNOSIS — I255 Ischemic cardiomyopathy: Secondary | ICD-10-CM | POA: Diagnosis not present

## 2022-06-08 DIAGNOSIS — I5042 Chronic combined systolic (congestive) and diastolic (congestive) heart failure: Secondary | ICD-10-CM | POA: Insufficient documentation

## 2022-06-08 DIAGNOSIS — Z800 Family history of malignant neoplasm of digestive organs: Secondary | ICD-10-CM | POA: Insufficient documentation

## 2022-06-08 DIAGNOSIS — Z7982 Long term (current) use of aspirin: Secondary | ICD-10-CM | POA: Insufficient documentation

## 2022-06-08 DIAGNOSIS — I252 Old myocardial infarction: Secondary | ICD-10-CM | POA: Diagnosis not present

## 2022-06-08 DIAGNOSIS — Z79899 Other long term (current) drug therapy: Secondary | ICD-10-CM | POA: Insufficient documentation

## 2022-06-08 DIAGNOSIS — J910 Malignant pleural effusion: Secondary | ICD-10-CM | POA: Insufficient documentation

## 2022-06-08 DIAGNOSIS — Z87891 Personal history of nicotine dependence: Secondary | ICD-10-CM | POA: Insufficient documentation

## 2022-06-08 DIAGNOSIS — C3431 Malignant neoplasm of lower lobe, right bronchus or lung: Secondary | ICD-10-CM | POA: Diagnosis not present

## 2022-06-08 DIAGNOSIS — I110 Hypertensive heart disease with heart failure: Secondary | ICD-10-CM | POA: Diagnosis not present

## 2022-06-08 DIAGNOSIS — Z803 Family history of malignant neoplasm of breast: Secondary | ICD-10-CM | POA: Diagnosis not present

## 2022-06-08 NOTE — Progress Notes (Signed)
Per Dr. Janese Banks, Vanessa Carlson needs to be ordered on pt's recent pleural fluid cytology. Order faxed to pathology to send out.

## 2022-06-08 NOTE — Progress Notes (Signed)
Here to get ct scan results

## 2022-06-08 NOTE — Progress Notes (Signed)
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  No evidence of nodal involvement.  She was not deemed to be a candidate for surgery and underwent SBRT to this lesion.  This was biopsy-proven adenocarcinoma with acinar and solid morphologies.  She then had a CT scan in January 2020 which showed enlarging satellite right upper lobe lung nodules.  This was followed by a PET CT scan which showed right upper lobe lung nodules 2 of them were hypermetabolic with an SUV of 8.7 was also a thyroid nodule approximately 4 mm in size which was not definitely hypermetabolic.  Patient was seen by Dr. Donella Stade following this and sh received SBRT for 5 fractions encompassing all these 3 nodules.  Patient has not had any systemic chemotherapy for her lung cancer so far.  There is a gradual increase in the size of her right upper lobe lung nodules in May  2022 and patient received SBRT to this area again by Dr. Donella Stade   Scans up until May 2023 showed stable postradiation changes in the right upper lobe.  Mildly enlarged right paratracheal but stable as compared to 3 months prior.  No new findings of metastatic disease.  However CT chest in September 2023 showed considerable increase in the area ofRight upper lobe lung mass from pre-existing 5.5 cm which included area of radiation fibrosis to 6.4 cm.  Enlargement of the left upper lobe lung nodule from 0.4 to 0.6 cm.  New moderate right pleural effusion interval enlargement of the pretracheal lymph nodes to 1.2 cm  Patient underwent right-sided thoracentesis which was consistent with adenocarcinoma of lung origin  Interval history-patient here with her son today and overall feels well.  Appetite and weight have remained stable.  Reports some mild fatigue  ECOG PS- 1 Pain scale- 0   Review of systems- Review of Systems  Constitutional:  Positive for malaise/fatigue. Negative for chills, fever and weight loss.  HENT:  Negative for congestion, ear discharge and nosebleeds.   Eyes:  Negative for blurred vision.  Respiratory:  Negative for cough, hemoptysis, sputum production, shortness of breath and wheezing.   Cardiovascular:  Negative for chest pain, palpitations, orthopnea and claudication.  Gastrointestinal:  Negative for abdominal pain, blood in stool, constipation, diarrhea, heartburn, melena, nausea and vomiting.  Genitourinary:  Negative for dysuria, flank pain, frequency, hematuria and urgency.  Musculoskeletal:  Negative for back pain, joint pain and myalgias.  Skin:  Negative for rash.  Neurological:  Negative for dizziness, tingling, focal weakness, seizures, weakness and headaches.  Endo/Heme/Allergies:  Does  not bruise/bleed easily.  Psychiatric/Behavioral:  Negative for depression and suicidal ideas. The patient does not have insomnia.       No Known Allergies   Past Medical  History:  Diagnosis Date   AICD (automatic cardioverter/defibrillator) present    Anterior myocardial infarction ([place])    a. 10/2014 - occluded LAD, complicated by cardiogenic shock-->Med Rx as interventional team was unable to open LAD.   Arthritis    arthirits of knee on right   Cancer of upper lobe of right lung ([place]) 09/2017   radiation therapy right side   Cervical cancer (H[facility])    in remission for ~20 yrs, s/p hysterectomy   Chronic combined systolic and diastolic CHF (congestive heart failure) ([place])    a. 10/2015 Echo: EF 20-25%, sev diast dysfxn, mildly reduced RV fxn. Nl MV prosthesis; b. 12/2017 Echo: EF 20-25%, diff HK. Sev ant/antsept HK, apical AK. Mild MS (mean graad 21mHg). Mod TR. PASP 440mg.   Coronary artery disease    a. 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   Essential hypertension    Ischemic cardiomyopathy    a. 03/2015 s/p MDT single lead AICD;  b. 10/2015 Echo: EF 20-25%;  c. 12/2017 Echo: EF 20-25%, diff HK.   Mass of upper lobe of right lung    a. noted on CXR & CT 08/2017 w/ abnl PET CT.   Mitral valve disease    a. 2006 s/p MVR w/ 279mJM bileaflet mechanical valve-->chronic coumadin/ASA; b. 12/2017 Echo: Mild MS. Mean gradient 4mm42m   Personal history of radiation therapy    f/u lung ca     Past Surgical History:  Procedure Laterality Date   APPENDECTOMY  1998   BREAST EXCISIONAL BIOPSY Left 80s   benign   CARDIAC CATHETERIZATION     CARDIAC DEFIBRILLATOR PLACEMENT     COLONOSCOPY WITH PROPOFOL N/A 01/11/2020   Procedure: COLONOSCOPY WITH PROPOFOL;  Surgeon: AnnaJonathon Bellows;  [facility];  Service: Gastroenterology;  Laterality: N/A;   RADICAL HYSTERECTOMY  1998   REPLACEMENT TOTAL KNEE Right 12/10/2021   SHOULDER SURGERY Bilateral    rotator cuff tears   VALVE REPLACEMENT     Mitral valve; 27 mm [employer] Jude bileaflet valve    Social History    Socioeconomic History   Marital status: Married    Spouse name: Tosin   Number of children: 2   Years of education: 14   Highest education level: Some college, no degree  Occupational History   Occupation: disable  Tobacco Use   Smoking status: Former    Packs/day: 1.00    Years: 4.00    Total pack years: 4.00    Types: Cigarettes    Quit date: 1998    Years since quitting: 25.7    Passive exposure: Past   Smokeless tobacco: Never  Vaping Use   Vaping Use: Never used  Substance and Sexual Activity   Alcohol use: No   Drug use: No   Sexual activity: Yes    Partners: Male    Birth control/protection: Surgical  Other Topics Concern   Not on file  Social History Narrative   Not on file   Social Determinants of Health   Financial Resource Strain: Low Risk  (10/29/2021)   Overall Financial Resource Strain (CARDIA)    Difficulty of Paying Living Expenses: Not hard at all  Food Insecurity: No Food Insecurity (10/29/2021)   Hunger  Vital Sign    Worried About Charity fundraiser in the Last Year: Never true    Ran Out of Food in the Last Year: Never true  Transportation Needs: No Transportation Needs (10/29/2021)   PRAPARE - Hydrologist (Medical): No    Lack of Transportation (Non-Medical): No  Physical Activity: Insufficiently Active (10/29/2021)   Exercise Vital Sign    Days of Exercise per Week: 2 days    Minutes of Exercise per Session: 20 min  Stress: No Stress Concern Present (10/29/2021)   [place]    Feeling of Stress : Not at all  Social Connections: Moderately Integrated (10/29/2021)   Social Connection and Isolation Panel [NHANES]    Frequency of Communication with Friends and Family: Three times a week    Frequency of Social Gatherings with Friends and Family: Once a week    Attends Religious Services: More than 4 times per year    Active Member of [employer] or Organizations:  No    Attends Archivist Meetings: Never    Marital Status: Married  Human resources officer Violence: Not At Risk (10/29/2021)   Humiliation, Afraid, Rape, and Kick questionnaire    Fear of Current or Ex-Partner: No    Emotionally Abused: No    Physically Abused: No    Sexually Abused: No    Family History  Problem Relation Age of Onset   Heart attack Mother    Hypertension Mother    Diabetes Mother    Emphysema Father        smoker   Diabetes Maternal Grandmother    Kidney disease Maternal Grandmother    Breast cancer Maternal Aunt    Breast cancer Maternal Aunt    Colon cancer Maternal Uncle      Current Outpatient Medications:    aspirin 81 MG chewable tablet, Chew 81 mg by mouth daily., Disp: , Rfl:    atorvastatin (LIPITOR) 80 MG tablet, TAKE 1 TABLET BY MOUTH ONCE DAILY. OFFICE VISIT AND LABS NEEDED FOR FURTHER REFILLS, Disp: 30 tablet, Rfl: 0   cyclobenzaprine (FLEXERIL) 5 MG tablet, Take 1 tablet (5 mg total) by mouth 3 (three) times daily as needed for muscle spasms., Disp: 30 tablet, Rfl: 1   ENTRESTO 24-26 MG, TAKE 1 TABLET BY MOUTH TWICE DAILY *SCHEDULE OFFICE VISIT FOR FURTHER REFILLS*, Disp: 180 tablet, Rfl: 1   ezetimibe (ZETIA) 10 MG tablet, TAKE 1 TABLET BY MOUTH ONCE DAILY . APPOINTMENT REQUIRED FOR FUTURE REFILLS, Disp: 30 tablet, Rfl: 0   fluticasone (FLONASE) 50 MCG/ACT nasal spray, Place 2 sprays into both nostrils as needed for allergies or rhinitis., Disp: , Rfl:    furosemide (LASIX) 40 MG tablet, Take 2 tablets (80 mg total) by mouth 2 (two) times daily., Disp: 120 tablet, Rfl: 5   gabapentin (NEURONTIN) 300 MG capsule, Take 1 capsule (300 mg total) by mouth 2 (two) times daily., Disp: 180 capsule, Rfl: 3   metolazone (ZAROXOLYN) 2.5 MG tablet, Take 1 tablet (2.5 mg) by mouth once a week. Take 30 minutes prior to your furosemide dose., Disp: 12 tablet, Rfl: 0   metoprolol succinate (TOPROL-XL) 25 MG 24 hr tablet, TAKE 1 TABLET BY MOUTH TWICE DAILY IN  THE MORNING AND AT BEDTIME, Disp: 180 tablet, Rfl: 0   potassium chloride SA (KLOR-CON M) 20 MEQ tablet, Take 2 tablets (40 mEq total) by mouth daily., Disp: 60 tablet, Rfl: 6   spironolactone (  ALDACTONE) 25 MG tablet, Take 1 tablet (25 mg total) by mouth daily., Disp: 90 tablet, Rfl: 3   traMADol (ULTRAM) 50 MG tablet, Take 1 tablet (50 mg total) by mouth every 6 (six) hours as needed., Disp: 30 tablet, Rfl: 0   warfarin (COUMADIN) 5 MG tablet, 5 mg daily except 7.5 mg on Mon and Fri, Disp: 90 tablet, Rfl: 1   warfarin (COUMADIN) 7.5 MG tablet, 5 mg daily except 7.5 mg on Mon and Fri, Disp: 60 tablet, Rfl: 1   LORazepam (ATIVAN) 0.5 MG tablet, Take 1 tablet (0.5 mg total) by mouth as directed. Pt will take 0.5 tablet 1 hour before the pet and then 0.5 mg 1 hour before mri of brain (Patient not taking: Reported on 06/08/2022), Disp: 2 tablet, Rfl: 0   nitroGLYCERIN (NITROSTAT) 0.3 MG SL tablet, Place 1 tablet (0.3 mg total) under the tongue every 5 (five) minutes as needed for chest pain. (Patient not taking: Reported on 06/08/2022), Disp: 30 tablet, Rfl: 3  Physical exam:  Vitals:   06/08/22 1106  BP: 107/60  Pulse: 79  Resp: 16  Temp: 98.3 F (36.8 C)  TempSrc: Oral  Weight: 226 lb (102.5 kg)  Height: 5' 2.5" (1.588 m)   Physical Exam Cardiovascular:     Rate and Rhythm: Normal rate and regular rhythm.     Heart sounds: Normal heart sounds.  Pulmonary:     Effort: Pulmonary effort is normal.  Skin:    General: Skin is warm and dry.  Neurological:     Mental Status: She is alert and oriented to person, place, and time.         Latest Ref Rng & Units 05/20/2022    9:14 AM  CMP  Glucose 70 - 99 mg/dL 167   BUN 8 - 23 mg/dL 13   Creatinine 0.44 - 1.00 mg/dL 1.15   Sodium 135 - 145 mmol/L 140   Potassium 3.5 - 5.1 mmol/L 3.9   Chloride 98 - 111 mmol/L 103   CO2 22 - 32 mmol/L 28   Calcium 8.9 - 10.3 mg/dL 9.2   Total Protein 6.5 - 8.1 g/dL 7.9   Total Bilirubin 0.3 - 1.2  mg/dL 1.1   Alkaline Phos 38 - 126 U/L 77   AST 15 - 41 U/L 22   ALT 0 - 44 U/L 17       Latest Ref Rng & Units 05/20/2022    9:14 AM  CBC  WBC 4.0 - 10.5 K/uL 5.4   Hemoglobin 12.0 - 15.0 g/dL 12.6   Hematocrit 36.0 - 46.0 % 40.0   Platelets 150 - 400 K/uL 186     No images are attached to the encounter.  US THORACENTESIS ASP PLEURAL SPACE W/IMG GUIDE  Result Date: 05/26/2022 INDICATION: Lung cancer with right pleural effusion. Request for diagnostic and therapeutic thoracentesis. EXAM: ULTRASOUND GUIDED RIGHT THORACENTESIS MEDICATIONS: 1% lidocaine 10 mL COMPLICATIONS: None immediate. PROCEDURE: An ultrasound guided thoracentesis was thoroughly discussed with the patient and questions answered. The benefits, risks, alternatives and complications were also discussed. The patient understands and wishes to proceed with the procedure. Written consent was obtained. Ultrasound was performed to localize and mark an adequate pocket of fluid in the right chest. The area was then prepped and draped in the normal sterile fashion. 1% Lidocaine was used for local anesthesia. Under ultrasound guidance a 6 Fr Safe-T-Centesis catheter was introduced. Thoracentesis was performed. The catheter was removed and a dressing applied. FINDINGS: A  total of approximately 550 mL of clear amber fluid was removed. Samples were sent to the laboratory as requested by the clinical team. IMPRESSION: Successful ultrasound guided right thoracentesis yielding 550 mL of pleural fluid. No pneumothorax on post-procedure chest x-ray. Procedure performed by: Gareth Eagle, PA-C Electronically Signed   By: Corrie Mckusick D.O.   On: 05/26/2022 15:50   DG Chest Port 1 View  Result Date: 05/26/2022 CLINICAL DATA:  S/p right sided thora; 550 ml taken out EXAM: PORTABLE CHEST - 1 VIEW COMPARISON:  CT 05/21/22 FINDINGS: No pneumothorax. No significant residual pleural effusion. Persistent right suprahilar/upper lobe consolidation. Left lung  clear. Left subclavian AICD stable. Heart size and mediastinal contours are within normal limits. Changes of MVR. Visualized bones unremarkable. IMPRESSION: No pneumothorax post right thoracentesis. Persistent right suprahilar consolidation. Electronically Signed   By: Lucrezia Europe M.D.   On: 05/26/2022 15:35   CT Chest W Contrast  Result Date: 05/23/2022 CLINICAL DATA:  Lung cancer restaging, status post SBRT * Tracking Code: BO * EXAM: CT CHEST WITH CONTRAST TECHNIQUE: Multidetector CT imaging of the chest was performed during intravenous contrast administration. RADIATION DOSE REDUCTION: This exam was performed according to the departmental dose-optimization program which includes automated exposure control, adjustment of the mA and/or kV according to patient size and/or use of iterative reconstruction technique. CONTRAST:  54m OMNIPAQUE IOHEXOL 300 MG/ML  SOLN COMPARISON:  01/12/2022 FINDINGS: Cardiovascular: Left chest multi lead pacer defibrillator. Aortic atherosclerosis. Mild cardiomegaly. Left and right coronary artery calcifications. Mitral valve prosthesis. No pericardial effusion. Mediastinum/Nodes: Slight interval enlargement of prominent pretracheal nodes, index node measuring 1.2 x 1.2 cm, previously 0.9 x 0.8 cm (series 2, image 47). Thyroid gland, trachea, and esophagus demonstrate no significant findings. Lungs/Pleura: Significant interval increase in size of a suprahilar mass of the paramedian right upper lobe, measuring 6.4 x 4.6 cm, previously 5.5 x 3.6 cm when measured similarly, and with a large interface to the medial pleura and mediastinum (series 2, image 39). Postobstructive atelectasis or consolidation of the apical right upper lobe. Interval enlargement of a left upper lobe nodule measuring 0.6 x 0.5 cm, previously no greater than 0.4 cm (series 3, image 36). Additional new small nodules in the left apex measuring up to 0.5 cm (series 3, image 25) as well as in the right lower lobe  (series 3, image 48). New, moderate right pleural effusion and associated atelectasis or consolidation. Upper Abdomen: No acute abnormality. Musculoskeletal: No acute osseous findings. Unchanged sclerotic fractures of the anterior right second and third ribs (series 2, image 39). IMPRESSION: 1. Significant interval increase in size of a suprahilar mass of the paramedian right upper lobe, consistent with recurrent primary lung malignancy. Postobstructive atelectasis or consolidation of the apical right upper lobe. 2. Multiple new and enlarging small nodules, highly suspicious for small metastases. 3. Slight interval enlargement of prominent pretracheal nodes, concerning for worsened nodal metastatic. 4. New, moderate right pleural effusion, presumably malignant, although without direct evidence of pleural thickening or nodularity. 5. Coronary artery disease. These results will be called to the ordering clinician or representative by the Radiologist Assistant, and communication documented in the PACS or [employer] Aortic Atherosclerosis (ICD10-I70.0). Electronically Signed   By: ADelanna AhmadiM.D.   On: 05/23/2022 20:19     Assessment and plan- Patient is a 61y.o. female with history of stage I lung cancer in the past now with stage IV adenocarcinoma with malignant right pleural effusion here to discuss pathology results and further  management  Scans apalutamide May 2023 showed stable radiation fibrosis measuring up to 5.5 cm in concern for any progressive disease.  There is a mildly enlarged pretracheal lymph node at 0.9 cm which has remained stable over the last 3 months.  However scans in September 2023 shows increase in the size of the right upper lobe area of radiation fibrosis extending up to 6.4 cm.  Increase in the size of pretracheal adenopathy and new moderate right pleural effusion.  Thoracentesis pathology positive for adenocarcinoma of the lung.  This would therefore constitute stage IVa  disease.  At this time I am awaiting results of NGS testing to see if she has any actionable mutations.  She quit smoking about 25 years ago.  If she has actionable mutations she can be potentially treated with oral therapy.  If she has no actionable mutations I will want to see what her PD-L1 expression is.  Patient is more than 50% she would be candidate for single agent immunotherapy.  However if she has a low PD-L1 expression consideration will be given for combination chemoimmunotherapy.  She will also be getting a PET scan next week and a brain MRI at the end of this month.  I will see her back NGS testing are back.  Patient is not overtly symptomatic at this time despite stage IV disease and I will therefore hold off on starting any systemic treatment on NGS testing results.  Treatment will be given with a palliative intent. All this has been explained in great detail to the patient and her son   Visit Diagnosis 1. Primary lung adenocarcinoma, right ([place])   2. Goals of care, counseling/discussion      Dr. Randa Evens, MD, MPH [facility] at [facility] [phone] 06/08/2022 2:25 PM

## 2022-06-10 ENCOUNTER — Ambulatory Visit
Admission: RE | Admit: 2022-06-10 | Discharge: 2022-06-10 | Disposition: A | Discharge status: Home / Self Care | Payer: Medicare HMO | Source: Ambulatory Visit | Attending: Oncology | Admitting: Oncology

## 2022-06-10 ENCOUNTER — Encounter: Payer: Self-pay | Admitting: Internal Medicine

## 2022-06-10 DIAGNOSIS — J90 Pleural effusion, not elsewhere classified: Secondary | ICD-10-CM | POA: Insufficient documentation

## 2022-06-10 DIAGNOSIS — C3490 Malignant neoplasm of unspecified part of unspecified bronchus or lung: Secondary | ICD-10-CM | POA: Insufficient documentation

## 2022-06-10 LAB — GLUCOSE, CAPILLARY: Glucose-Capillary: 155 mg/dL — ABNORMAL HIGH (ref 70–99)

## 2022-06-10 MED ORDER — FLUDEOXYGLUCOSE F - 18 (FDG) INJECTION
11.7000 | Freq: Once | INTRAVENOUS | Status: AC | PRN
  Administered 2022-06-10: 12.69 via INTRAVENOUS

## 2022-06-11 ENCOUNTER — Ambulatory Visit: Payer: Medicare HMO | Admitting: Internal Medicine

## 2022-06-14 ENCOUNTER — Ambulatory Visit (INDEPENDENT_AMBULATORY_CARE_PROVIDER_SITE_OTHER): Payer: Medicare HMO

## 2022-06-14 DIAGNOSIS — I5022 Chronic systolic (congestive) heart failure: Secondary | ICD-10-CM

## 2022-06-14 DIAGNOSIS — Z95810 Presence of automatic (implantable) cardiac defibrillator: Secondary | ICD-10-CM | POA: Diagnosis not present

## 2022-06-16 NOTE — Progress Notes (Signed)
EPIC Encounter for ICM Monitoring  Patient Name: Vanessa Carlson is a 61 y.o. female Date: 06/16/2022 Primary Care Physican: Virginia Crews, MD Primary Cardiologist: Carlis Abbott, NP HF Electrophysiologist: Caryl Comes 02/04/2022 Office Weight: 228 lbs 02/24/2022 Weight: 226 lbs         Transmission reviewed.   Optivol thoracic impedance suggesting normal fluid levels.    Prescribed:  Furosemide 40 mg 2 tablets (80 mg total) by mouth two times daily Spironolactone 25 mg take 1 tablet daily   Labs: 05/20/2022 Creatinine 1.15, BUN 13, Potassium 3.9, Sodium 140, GFR 54 10/02/2021 Creatinine 1.12, BUN 14, Potassium 4.2, Sodium 137, GFR 56 A complete set of results can be found in Results Review.   Recommendations:  No changes.    Follow-up plan: ICM clinic phone appointment on 07/19/2022.   91 day device clinic remote transmission 07/06/2022.       EP/Cardiology Office Visits:  Recall 03/23/2023 with Dr. Caryl Comes.  07/07/2022 with Dr End.   Copy of ICM check sent to Dr. Caryl Comes.    3 month ICM trend: 06/14/2022.    12-14 Month ICM trend:     [name], RN 06/16/2022 2:00 PM

## 2022-06-17 DIAGNOSIS — R002 Palpitations: Secondary | ICD-10-CM | POA: Diagnosis not present

## 2022-06-17 DIAGNOSIS — I480 Paroxysmal atrial fibrillation: Secondary | ICD-10-CM | POA: Diagnosis not present

## 2022-06-18 ENCOUNTER — Other Ambulatory Visit: Payer: Self-pay

## 2022-06-18 ENCOUNTER — Encounter: Payer: Self-pay | Admitting: Family Medicine

## 2022-06-19 ENCOUNTER — Other Ambulatory Visit: Payer: Self-pay | Admitting: Internal Medicine

## 2022-06-21 ENCOUNTER — Encounter: Payer: Self-pay | Admitting: Urology

## 2022-06-21 ENCOUNTER — Ambulatory Visit (INDEPENDENT_AMBULATORY_CARE_PROVIDER_SITE_OTHER): Payer: Medicare HMO | Admitting: Urology

## 2022-06-21 VITALS — BP 123/80 | HR 87 | Ht 62.0 in | Wt 223.0 lb

## 2022-06-21 DIAGNOSIS — N3946 Mixed incontinence: Secondary | ICD-10-CM | POA: Diagnosis not present

## 2022-06-21 LAB — URINALYSIS, COMPLETE
Bilirubin, UA: NEGATIVE
Glucose, UA: NEGATIVE
Ketones, UA: NEGATIVE
Leukocytes,UA: NEGATIVE
Nitrite, UA: NEGATIVE
RBC, UA: NEGATIVE
Specific Gravity, UA: 1.025 (ref 1.005–1.030)
Urobilinogen, Ur: 2 mg/dL — ABNORMAL HIGH (ref 0.2–1.0)
pH, UA: 5.5 (ref 5.0–7.5)

## 2022-06-21 LAB — MICROSCOPIC EXAMINATION

## 2022-06-21 MED ORDER — TRAMADOL HCL 50 MG PO TABS: 50.0000 mg | ORAL_TABLET | Freq: Four times a day (QID) | ORAL | 0 refills | Status: DC | PRN

## 2022-06-21 MED ORDER — OXYBUTYNIN CHLORIDE ER 10 MG PO TB24: 10.0000 mg | ORAL_TABLET | Freq: Every day | ORAL | 11 refills | Status: DC

## 2022-06-21 NOTE — Addendum Note (Signed)
Addended by: Despina Hidden on: 06/21/2022 12:04 PM   Modules accepted: Orders

## 2022-06-21 NOTE — Progress Notes (Signed)
06/21/2022 11:00 AM   Vanessa Carlson [birth year] [id number]  Referring provider: Virginia Crews, [address] [place],  [place] [address]  Chief Complaint  Patient presents with   Results    HPI: I was consulted to assess the patient's urinary incontinence.  She leaks with coughing sneezing bending lifting.  She is urge incontinence.  Both are significant.  She wears 1 or 2 pads a day that are moderately wet.  No bedwetting.  She voids every 1 or 2 hours gets up twice at night with a good flow  She has had a hysterectomy.     Patient has mild mixed incontinence.  She has frequency and nocturia.  Reassess in 6 weeks on Myrbetriq 50 mg samples and prescription for cystoscopy.    Last culture positive. Mild improvement on Myrbetriq.  Clinically not infected today On pelvic examination patient had mild grade 2 hypermobility the bladder neck negative cough test no prolapse Cystoscopy: Urine was a little bit cloudy and I sent it for culture and will call if positive     Patient has mild mixed incontinence.  Reassess in 5 weeks on Gemtesa samples.  If patient still leaking order urodynamics and I will explain the test in more detail but was mention today.  Call if culture positive.  If culture positive consider suppression therapy at least for 6 months   Last culture negative. Urge incontinence dramatically better.  Less frequency and urgency.  Clinically not infected     Patient says medicine is no longer working.  She still leaks with coughing sneezing.  Still has urge incontinence.  Both are significant.  Clinically not infected wearing 2 pads a day moderately wet    It will be resting to see if her culture is positive and if so if she responded favorably to an antibiotic  Today He can see stable.  Incontinence stable.  Culture from August not in medical record On urodynamics patient did not void and was catheterized for a few milliliters.  Maximum bladder  capacity was 400 mL.  Bladder was unstable reaching pressures of 31 cm of water.  She would leak without any warning with these unstable contractions and several were provoked with coughing and Valsalva.  She had no stress incontinence reaching Valsalva pressures of 140 cm of water.  During voiding she voided 161 mL with a maximal flow of 7 mils per second.  Maximum voiding pressure 8 cm water.  Residual 333 mL.  EMG activity present during voiding.  Bladder neck descended approximately 2 cm.  She had spinal hardware noted.  She has several provoked contractions as noted.  She did not feel when she would leak.  The details of the urodynamics are signed and dictated       PMH: Past Medical History:  Diagnosis Date   AICD (automatic cardioverter/defibrillator) present    Anterior myocardial infarction [facility])    a. 10/2014 - occluded LAD, complicated by cardiogenic shock-->Med Rx as interventional team was unable to open LAD.   Arthritis    arthirits of knee on right   Cancer of upper lobe of right lung ([place]) 09/2017   radiation therapy right side   Cervical cancer (HCC)    in remission for ~20 yrs, s/p hysterectomy   Chronic combined systolic and diastolic CHF (congestive heart failure) ([place])    a. 10/2015 Echo: EF 20-25%, sev diast dysfxn, mildly reduced RV fxn. Nl MV prosthesis; b. 12/2017 Echo: EF 20-25%, diff  HK. Sev ant/antsept HK, apical AK. Mild MS (mean graad 73mmHg). Mod TR. PASP 24mmHg.   Coronary artery disease    a. 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   Essential hypertension    Ischemic cardiomyopathy    a. 03/2015 s/p MDT single lead AICD;  b. 10/2015 Echo: EF 20-25%;  c. 12/2017 Echo: EF 20-25%, diff HK.   Mass of upper lobe of right lung    a. noted on CXR & CT 08/2017 w/ abnl PET CT.   Mitral valve disease    a. 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  12/2017 Echo: Mild MS. Mean gradient 36mmHg.   Personal history of radiation therapy    f/u lung ca    Surgical History: Past Surgical History:  Procedure Laterality Date   APPENDECTOMY  1998   BREAST EXCISIONAL BIOPSY Left 80s   benign   CARDIAC CATHETERIZATION     CARDIAC DEFIBRILLATOR PLACEMENT     COLONOSCOPY WITH PROPOFOL N/A 01/11/2020   Procedure: COLONOSCOPY WITH PROPOFOL;  Surgeon: Jonathon Bellows, MD;  Location[facility] ENDOSCOPY;  Service: Gastroenterology;  Laterality: N/A;   RADICAL HYSTERECTOMY  1998   REPLACEMENT TOTAL KNEE Right 12/10/2021   SHOULDER SURGERY Bilateral    rotator cuff tears   VALVE REPLACEMENT     Mitral valve; 27 mm [employer] Jude bileaflet valve    Home Medications:  Allergies as of 06/21/2022   No Known Allergies      Medication List        Accurate as of June 21, 2022 11:00 AM. If you have any questions, ask your nurse or doctor.          aspirin 81 MG chewable tablet Chew 81 mg by mouth daily.   atorvastatin 80 MG tablet Commonly known as: LIPITOR TAKE 1 TABLET BY MOUTH ONCE DAILY. OFFICE VISIT AND LABS NEEDED FOR FURTHER REFILLS   cyclobenzaprine 5 MG tablet Commonly known as: FLEXERIL Take 1 tablet (5 mg total) by mouth 3 (three) times daily as needed for muscle spasms.   Entresto 24-26 MG Generic drug: sacubitril-valsartan TAKE 1 TABLET BY MOUTH TWICE DAILY *SCHEDULE OFFICE VISIT FOR FURTHER REFILLS*   ezetimibe 10 MG tablet Commonly known as: ZETIA TAKE 1 TABLET BY MOUTH ONCE DAILY . APPOINTMENT REQUIRED FOR FUTURE REFILLS   fluticasone 50 MCG/ACT nasal spray Commonly known as: FLONASE Place 2 sprays into both nostrils as needed for allergies or rhinitis.   furosemide 40 MG tablet Commonly known as: LASIX Take 2 tablets (80 mg total) by mouth 2 (two) times daily.   gabapentin 300 MG capsule Commonly known as: NEURONTIN Take 1 capsule (300 mg total) by mouth 2 (two) times daily.   LORazepam 0.5 MG tablet Commonly known  as: ATIVAN Take 1 tablet (0.5 mg total) by mouth as directed. Pt will take 0.5 tablet 1 hour before the pet and then 0.5 mg 1 hour before mri of brain   metolazone 2.5 MG tablet Commonly known as: ZAROXOLYN Take 1 tablet (2.5 mg) by mouth once a week. Take 30 minutes prior to your furosemide dose.   metoprolol succinate 25 MG 24 hr tablet Commonly known as: TOPROL-XL TAKE 1 TABLET BY MOUTH TWICE DAILY IN THE MORNING AND AT BEDTIME   nitroGLYCERIN 0.3 MG SL tablet Commonly known as: NITROSTAT Place 1 tablet (0.3 mg total) under the tongue every 5 (five) minutes as needed for  chest pain.   potassium chloride SA 20 MEQ tablet Commonly known as: KLOR-CON M Take 2 tablets (40 mEq total) by mouth daily.   spironolactone 25 MG tablet Commonly known as: ALDACTONE Take 1 tablet (25 mg total) by mouth daily.   traMADol 50 MG tablet Commonly known as: ULTRAM Take 1 tablet (50 mg total) by mouth every 6 (six) hours as needed.   warfarin 7.5 MG tablet Commonly known as: COUMADIN Take as directed by the anticoagulation clinic. If you are unsure how to take this medication, talk to your nurse or doctor. Original instructions: 5 mg daily except 7.5 mg on Mon and Fri   warfarin 5 MG tablet Commonly known as: COUMADIN Take as directed by the anticoagulation clinic. If you are unsure how to take this medication, talk to your nurse or doctor. Original instructions: 5 mg daily except 7.5 mg on Mon and Fri        Allergies: No Known Allergies  Family History: Family History  Problem Relation Age of Onset   Heart attack Mother    Hypertension Mother    Diabetes Mother    Emphysema Father        smoker   Diabetes Maternal Grandmother    Kidney disease Maternal Grandmother    Breast cancer Maternal Aunt    Breast cancer Maternal Aunt    Colon cancer Maternal Uncle     Social History:  reports that she quit smoking about 25 years ago. Her smoking use included cigarettes. She has a  4.00 pack-year smoking history. She has been exposed to tobacco smoke. She has never used smokeless tobacco. She reports that she does not drink alcohol and does not use drugs.  ROS:                                        Physical Exam: BP 123/80   Pulse 87   Ht 5\' 2"  (1.575 m)   Wt 101.2 kg   LMP  (LMP Unknown)   BMI 40.79 kg/m   Constitutional:  Alert and oriented, No acute distress. HEENT: [place] AT, moist mucus membranes.  Trachea midline, no masses.   Laboratory Data: Lab Results  Component Value Date   WBC 5.4 05/20/2022   HGB 12.6 05/20/2022   HCT 40.0 05/20/2022   MCV 88.9 05/20/2022   PLT 186 05/20/2022    Lab Results  Component Value Date   CREATININE 1.15 (H) 05/20/2022    No results found for: "PSA"  No results found for: "TESTOSTERONE"  Lab Results  Component Value Date   HGBA1C 5.9 (H) 12/14/2019    Urinalysis    Component Value Date/Time   COLORURINE YELLOW (A) 05/27/2021 0005   APPEARANCEUR Clear 09/21/2021 1417   LABSPEC 1.014 05/27/2021 0005   PHURINE 5.0 05/27/2021 0005   GLUCOSEU Negative 09/21/2021 1417   HGBUR MODERATE (A) 05/27/2021 0005   BILIRUBINUR Negative 09/21/2021 1417   KETONESUR NEGATIVE 05/27/2021 0005   PROTEINUR Negative 09/21/2021 1417   PROTEINUR 30 (A) 05/27/2021 0005   NITRITE Negative 09/21/2021 1417   NITRITE POSITIVE (A) 05/27/2021 0005   LEUKOCYTESUR Negative 09/21/2021 1417   LEUKOCYTESUR MODERATE (A) 05/27/2021 0005    Pertinent Imaging:   Assessment & Plan: Patient has mixed incontinence with primarily an overactive bladder.  I recommended oxybutynin ER 10 mg 3x11 and return to clinic to discuss refractory treatments next visit if  she does not reach her treatment goal.  There are no diagnoses linked to this encounter.  No follow-ups on file.  Reece Packer, MD  [facility] [address] [place], [place] [address] [phone]

## 2022-06-22 ENCOUNTER — Encounter: Payer: Self-pay | Admitting: Oncology

## 2022-06-23 ENCOUNTER — Ambulatory Visit (INDEPENDENT_AMBULATORY_CARE_PROVIDER_SITE_OTHER): Payer: Medicare HMO | Admitting: *Deleted

## 2022-06-23 DIAGNOSIS — I480 Paroxysmal atrial fibrillation: Secondary | ICD-10-CM

## 2022-06-23 DIAGNOSIS — Z952 Presence of prosthetic heart valve: Secondary | ICD-10-CM

## 2022-06-23 LAB — POCT INR
POC INR: 4
PT: 48.1

## 2022-06-23 NOTE — Patient Instructions (Signed)
Change dose to 5 mg daily and follow up 07/07/2022.

## 2022-06-24 ENCOUNTER — Telehealth: Payer: Self-pay | Admitting: Oncology

## 2022-06-24 LAB — CULTURE, URINE COMPREHENSIVE

## 2022-06-24 NOTE — Telephone Encounter (Signed)
Per Chat- I would like to see her 1.45 pm tomorrow. no labs-- appointment scheduled and patient notified- BC

## 2022-06-25 ENCOUNTER — Inpatient Hospital Stay: Payer: Medicare HMO | Admitting: Oncology

## 2022-06-25 ENCOUNTER — Telehealth: Payer: Self-pay | Admitting: *Deleted

## 2022-06-25 ENCOUNTER — Other Ambulatory Visit: Payer: Self-pay | Admitting: *Deleted

## 2022-06-25 ENCOUNTER — Encounter: Payer: Self-pay | Admitting: Oncology

## 2022-06-25 ENCOUNTER — Ambulatory Visit: Payer: Medicare HMO | Admitting: Internal Medicine

## 2022-06-25 VITALS — BP 102/50 | HR 73 | Temp 98.5°F | Resp 16 | Ht 62.0 in | Wt 224.5 lb

## 2022-06-25 DIAGNOSIS — C3431 Malignant neoplasm of lower lobe, right bronchus or lung: Secondary | ICD-10-CM | POA: Diagnosis not present

## 2022-06-25 DIAGNOSIS — I5042 Chronic combined systolic (congestive) and diastolic (congestive) heart failure: Secondary | ICD-10-CM | POA: Diagnosis not present

## 2022-06-25 DIAGNOSIS — I700 Atherosclerosis of aorta: Secondary | ICD-10-CM | POA: Diagnosis not present

## 2022-06-25 DIAGNOSIS — Z7189 Other specified counseling: Secondary | ICD-10-CM

## 2022-06-25 DIAGNOSIS — Z8541 Personal history of malignant neoplasm of cervix uteri: Secondary | ICD-10-CM | POA: Diagnosis not present

## 2022-06-25 DIAGNOSIS — J910 Malignant pleural effusion: Secondary | ICD-10-CM | POA: Diagnosis not present

## 2022-06-25 DIAGNOSIS — I2510 Atherosclerotic heart disease of native coronary artery without angina pectoris: Secondary | ICD-10-CM | POA: Diagnosis not present

## 2022-06-25 DIAGNOSIS — I110 Hypertensive heart disease with heart failure: Secondary | ICD-10-CM | POA: Diagnosis not present

## 2022-06-25 DIAGNOSIS — I255 Ischemic cardiomyopathy: Secondary | ICD-10-CM | POA: Diagnosis not present

## 2022-06-25 DIAGNOSIS — C3491 Malignant neoplasm of unspecified part of right bronchus or lung: Secondary | ICD-10-CM

## 2022-06-25 DIAGNOSIS — I252 Old myocardial infarction: Secondary | ICD-10-CM | POA: Diagnosis not present

## 2022-06-25 MED ORDER — PROCHLORPERAZINE MALEATE 10 MG PO TABS: 10.0000 mg | ORAL_TABLET | Freq: Four times a day (QID) | ORAL | 1 refills | Status: DC | PRN

## 2022-06-25 MED ORDER — LIDOCAINE-PRILOCAINE 2.5-2.5 % EX CREA: TOPICAL_CREAM | CUTANEOUS | 3 refills | Status: DC

## 2022-06-25 MED ORDER — LORAZEPAM 0.5 MG PO TABS: 0.5000 mg | ORAL_TABLET | Freq: Once | ORAL | 0 refills | Status: AC

## 2022-06-25 MED ORDER — ONDANSETRON HCL 8 MG PO TABS: 8.0000 mg | ORAL_TABLET | Freq: Three times a day (TID) | ORAL | 1 refills | Status: DC | PRN

## 2022-06-25 NOTE — Telephone Encounter (Signed)
I called patient and she was not at home so I left her a voicemail stating that we can get the Port-A-Cath insertion on Tuesday 10/31 with an arrival 9 AM over at the cardiac and vascular lab.  I told the patient that she can walk on the front of the building and she would see where it says cardiac and vascular.  That is a new opening and then as soon as she goes in the door they have somebody that will check her in give her a bracelet and then they go back to where they will do the Port-A-Cath.  They will give conscious sedation so patient will have to have a driver, nothing to eat or drink 8 hours prior.  Patient will be there somewhere between 2 and 4 hours for this port insertion.  Also as far as the chemo teach that is on October 30 at 9:00 that lasted from 9-11 30 which is here in the cancer center.  [name] the scheduler said that you already had an MRI of the brain on October 30 and so if that is going to be too much on you to give [place] a call and we can reschedule that patient education part.  Your first infusion will be on November 3 which is Friday of next week you will have labs at 8 AM and an infusion at 830 you can see all of this information on MyChart except for the port . if you have questions please call me [phone].  The patient called back while I was typing this comment.  Patient cannot do Monday next week for  chemo teach.  We will move it to Thursday of the same week and patient knows to come to the cancer center at 9 AM.  Patient also wants some more of the sedative for her to go for her MRI on Monday and Dr. Janese Banks says that is fine and the order has been put in

## 2022-06-25 NOTE — Progress Notes (Signed)
START ON PATHWAY REGIMEN - Non-Small Cell Lung ? ? ?  A cycle is every 21 days: ?    Pembrolizumab  ? ?**Always confirm dose/schedule in your pharmacy ordering system** ? ?Patient Characteristics: ?Stage IV Metastatic, Nonsquamous, Molecular Analysis Completed, Molecular Alteration Present and Targeted Therapy Exhausted OR EGFR Exon 20+ or KRAS G12C+ or HER2+ Present and No Prior Chemo/Immunotherapy OR No Alteration Present, Initial  ?Chemotherapy/Immunotherapy, PS = 0, 1, No Alteration Present, No Alteration Present, Candidate for Immunotherapy, PD-L1 Expression Positive  ? 50% (TPS) and Immunotherapy Candidate ?Therapeutic Status: Stage IV Metastatic ?Histology: Nonsquamous Cell ?Broad Molecular Profiling Status: Molecular Analysis Completed ?Molecular Analysis Results: No Alteration Present ?ECOG Performance Status: 1 ?Chemotherapy/Immunotherapy Line of Therapy: Initial Chemotherapy/Immunotherapy ?EGFR Exons 18-21 Mutation Testing Status: Completed and Negative ?ALK Fusion/Rearrangement Testing Status: Completed and Negative ?BRAF V600 Mutation Testing Status: Completed and Negative ?KRAS G12C Mutation Testing Status: Completed and Negative ?MET Exon 14 Mutation Testing Status: Completed and Negative ?RET Fusion/Rearrangement Testing Status: Completed and Negative ?HER2 Mutation Testing Status: Completed and Negative ?NTRK Fusion/Rearrangement Testing Status: Completed and Negative ?ROS1 Fusion/Rearrangement Testing Status: Completed and Negative ?Immunotherapy Candidate Status: Candidate for Immunotherapy ?PD-L1 Expression Status: PD-L1 Positive ? 50% (TPS) ?Intent of Therapy: ?Non-Curative / Palliative Intent, Discussed with Patient ?

## 2022-06-26 ENCOUNTER — Other Ambulatory Visit: Payer: Self-pay

## 2022-06-27 ENCOUNTER — Encounter: Payer: Self-pay | Admitting: Oncology

## 2022-06-27 NOTE — Progress Notes (Signed)
Hematology/Oncology Consult note [facility]  [phone] [phone]  Patient Care Team: Virginia Crews, MD as PCP - General (Family Medicine) End, Harrell Gave, MD as PCP - Cardiology (Cardiology) Sindy Guadeloupe, MD as Consulting Physician (Oncology) Sindy Guadeloupe, MD as Consulting Physician (Oncology) Noreene Filbert, MD as Consulting Physician (Radiation Oncology) Lovell Sheehan, MD as Consulting Physician (Orthopedic Surgery)   Name of the patient: Vanessa Carlson  [id number]  June 26, 1961   Date of visit: 06/27/22  Diagnosis- history 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  No evidence of nodal involvement.  She was not deemed to be a candidate for surgery and underwent SBRT to this lesion.  This was biopsy-proven adenocarcinoma with acinar and solid morphologies.  She then had a CT scan in January 2020 which showed enlarging satellite right upper lobe lung nodules.  This was followed by a PET CT scan which showed right upper lobe lung nodules 2 of them were hypermetabolic with an SUV of 8.7 was also a thyroid nodule approximately 4 mm in size which was not definitely hypermetabolic.  Patient was seen by Dr. Donella Stade following this and sh received SBRT for 5 fractions encompassing all these 3 nodules.  Patient has not had any systemic chemotherapy for her lung cancer so far.  There is a gradual increase in the size of her right upper lobe lung nodules in May  2022 and patient received SBRT to this area again by Dr. Donella Stade   Scans up until May 2023 showed stable postradiation changes in the right upper lobe.  Mildly enlarged right paratracheal but stable as compared to 3 months prior.  No new findings of metastatic disease.  However CT chest in September 2023 showed considerable increase in the area ofRight upper lobe lung mass from pre-existing 5.5 cm which included area of radiation fibrosis to 6.4 cm.  Enlargement of the left upper lobe lung nodule from 0.4 to 0.6 cm.  New moderate right pleural effusion interval enlargement of the pretracheal lymph nodes to 1.2 cm   Patient underwent right-sided thoracentesis which was consistent with adenocarcinoma of lung origin  Interval history-patient is here with her son today.  Overall she is doing better after her pleural effusion was tapped. She has some baseline fatigue and exertional shortness of breath which is not presently worse.  ECOG PS- 1 Pain scale- 0   Review of systems- Review of Systems  Constitutional:  Positive for malaise/fatigue. Negative for chills, fever and weight loss.  HENT:  Negative for congestion, ear discharge and nosebleeds.   Eyes:  Negative for blurred vision.  Respiratory:  Positive for shortness of breath. Negative for cough, hemoptysis, sputum production and wheezing.   Cardiovascular:  Negative for chest pain, palpitations, orthopnea and claudication.  Gastrointestinal:  Negative for abdominal pain, blood in stool, constipation, diarrhea, heartburn, melena, nausea and vomiting.  Genitourinary:  Negative for dysuria, flank pain, frequency, hematuria and urgency.  Musculoskeletal:  Negative for back pain, joint pain and myalgias.  Skin:  Negative for rash.  Neurological:  Negative for dizziness, tingling, focal weakness, seizures, weakness and headaches.  Endo/Heme/Allergies:  Does not bruise/bleed easily.  Psychiatric/Behavioral:  Negative for depression and suicidal  ideas. The patient does not have insomnia.       No Known Allergies   Past Medical History:  Diagnosis Date   AICD (automatic cardioverter/defibrillator) present    Anterior myocardial infarction ([place])    a. 10/2014 - occluded LAD, complicated by cardiogenic shock-->Med Rx as interventional team was unable to open LAD.   Arthritis    arthirits of knee on right   Cancer of upper lobe of right lung ([place]) 09/2017   radiation therapy right side   Cervical cancer (HCC)    in remission for ~20 yrs, s/p hysterectomy   Chronic combined systolic and diastolic CHF (congestive heart failure) ([place])    a. 10/2015 Echo: EF 20-25%, sev diast dysfxn, mildly reduced RV fxn. Nl MV prosthesis; b. 12/2017 Echo: EF 20-25%, diff HK. Sev ant/antsept HK, apical AK. Mild MS (mean graad 12mHg). Mod TR. PASP 464mg.   Coronary artery disease    a. 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   Essential hypertension    Ischemic cardiomyopathy    a. 03/2015 s/p MDT single lead AICD;  b. 10/2015 Echo: EF 20-25%;  c. 12/2017 Echo: EF 20-25%, diff HK.   Mass of upper lobe of right lung    a. noted on CXR & CT 08/2017 w/ abnl PET CT.   Mitral valve disease    a. 2006 s/p MVR w/ 2770mJM bileaflet mechanical valve-->chronic coumadin/ASA; b. 12/2017 Echo: Mild MS. Mean gradient 4mm74m   Personal history of radiation therapy    f/u lung ca     Past Surgical History:  Procedure Laterality Date   APPENDECTOMY  1998   BREAST EXCISIONAL BIOPSY Left 80s   benign   CARDIAC CATHETERIZATION     CARDIAC DEFIBRILLATOR PLACEMENT     COLONOSCOPY WITH PROPOFOL N/A 01/11/2020   Procedure: COLONOSCOPY WITH PROPOFOL;  Surgeon: AnnaJonathon Bellows;  [facility];  Service: Gastroenterology;  Laterality: N/A;   RADICAL HYSTERECTOMY  1998   REPLACEMENT TOTAL KNEE Right 12/10/2021   SHOULDER SURGERY Bilateral    rotator cuff tears   VALVE  REPLACEMENT     Mitral valve; 27 mm [employer] Jude bileaflet valve    Social History   Socioeconomic History   Marital status: Married    Spouse name: Tosin   Number of children: 2   Years of education: 14   Highest education level: Some college, no degree  Occupational History   Occupation: disable  Tobacco Use   Smoking status: Former    Packs/day: 1.00    Years: 4.00    Total pack years: 4.00    Types: Cigarettes    Quit date: 1998    Years since quitting: 25.8    Passive exposure: Past   Smokeless tobacco: Never  Vaping Use   Vaping Use: Never used  Substance and Sexual Activity   Alcohol use: No   Drug use: No   Sexual activity: Yes    Partners: Male    Birth control/protection: Surgical  Other Topics Concern   Not on file  Social History Narrative   Not on file   Social Determinants of Health   Financial Resource Strain: Low Risk  (10/29/2021)   Overall Financial Resource Strain (CARDIA)    Difficulty of Paying  Living Expenses: Not hard at all  Food Insecurity: No Food Insecurity (10/29/2021)   Hunger Vital Sign    Worried About Running Out of Food in the Last Year: Never true    Ran Out of Food in the Last Year: Never true  Transportation Needs: No Transportation Needs (10/29/2021)   PRAPARE - Hydrologist (Medical): No    Lack of Transportation (Non-Medical): No  Physical Activity: Insufficiently Active (10/29/2021)   Exercise Vital Sign    Days of Exercise per Week: 2 days    Minutes of Exercise per Session: 20 min  Stress: No Stress Concern Present (10/29/2021)   [place]    Feeling of Stress : Not at all  Social Connections: Moderately Integrated (10/29/2021)   Social Connection and Isolation Panel [NHANES]    Frequency of Communication with Friends and Family: Three times a week    Frequency of Social Gatherings with Friends and Family: Once a week    Attends  Religious Services: More than 4 times per year    Active Member of [employer] or Organizations: No    Attends Archivist Meetings: Never    Marital Status: Married  Human resources officer Violence: Not At Risk (10/29/2021)   Humiliation, Afraid, Rape, and Kick questionnaire    Fear of Current or Ex-Partner: No    Emotionally Abused: No    Physically Abused: No    Sexually Abused: No    Family History  Problem Relation Age of Onset   Heart attack Mother    Hypertension Mother    Diabetes Mother    Emphysema Father        smoker   Diabetes Maternal Grandmother    Kidney disease Maternal Grandmother    Breast cancer Maternal Aunt    Breast cancer Maternal Aunt    Colon cancer Maternal Uncle      Current Outpatient Medications:    aspirin 81 MG chewable tablet, Chew 81 mg by mouth daily., Disp: , Rfl:    atorvastatin (LIPITOR) 80 MG tablet, TAKE 1 TABLET BY MOUTH ONCE DAILY. OFFICE VISIT AND LABS NEEDED FOR FURTHER REFILLS, Disp: 30 tablet, Rfl: 0   cyclobenzaprine (FLEXERIL) 5 MG tablet, Take 1 tablet (5 mg total) by mouth 3 (three) times daily as needed for muscle spasms., Disp: 30 tablet, Rfl: 1   ENTRESTO 24-26 MG, TAKE 1 TABLET BY MOUTH TWICE DAILY *SCHEDULE OFFICE VISIT FOR FURTHER REFILLS*, Disp: 180 tablet, Rfl: 1   ezetimibe (ZETIA) 10 MG tablet, TAKE 1 TABLET BY MOUTH ONCE DAILY . APPOINTMENT REQUIRED FOR FUTURE REFILLS, Disp: 30 tablet, Rfl: 0   fluticasone (FLONASE) 50 MCG/ACT nasal spray, Place 2 sprays into both nostrils as needed for allergies or rhinitis., Disp: , Rfl:    furosemide (LASIX) 40 MG tablet, Take 2 tablets (80 mg total) by mouth 2 (two) times daily., Disp: 120 tablet, Rfl: 5   gabapentin (NEURONTIN) 300 MG capsule, Take 1 capsule (300 mg total) by mouth 2 (two) times daily., Disp: 180 capsule, Rfl: 3   metolazone (ZAROXOLYN) 2.5 MG tablet, Take 1 tablet (2.5 mg) by mouth once a week. Take 30 minutes prior to your furosemide dose., Disp: 12 tablet, Rfl: 0    metoprolol succinate (TOPROL-XL) 25 MG 24 hr tablet, TAKE 1 TABLET BY MOUTH TWICE DAILY IN THE MORNING AND AT BEDTIME, Disp: 180 tablet, Rfl: 0   oxybutynin (DITROPAN-XL) 10 MG 24 hr tablet, Take 1  tablet (10 mg total) by mouth daily., Disp: 30 tablet, Rfl: 11   potassium chloride SA (KLOR-CON M) 20 MEQ tablet, Take 2 tablets (40 mEq total) by mouth daily., Disp: 60 tablet, Rfl: 6   spironolactone (ALDACTONE) 25 MG tablet, Take 1 tablet (25 mg total) by mouth daily., Disp: 90 tablet, Rfl: 3   traMADol (ULTRAM) 50 MG tablet, Take 1 tablet (50 mg total) by mouth every 6 (six) hours as needed., Disp: 30 tablet, Rfl: 0   lidocaine-prilocaine (EMLA) cream, Apply to affected area once, Disp: 30 g, Rfl: 3   ondansetron (ZOFRAN) 8 MG tablet, Take 1 tablet (8 mg total) by mouth every 8 (eight) hours as needed for nausea or vomiting., Disp: 30 tablet, Rfl: 1   prochlorperazine (COMPAZINE) 10 MG tablet, Take 1 tablet (10 mg total) by mouth every 6 (six) hours as needed for nausea or vomiting., Disp: 30 tablet, Rfl: 1   warfarin (COUMADIN) 5 MG tablet, 5 mg daily except 7.5 mg on Mon and Fri (Patient taking differently: Take 5 mg by mouth daily. 5 mg daily except 7.5 mg on Mon and Fri), Disp: 90 tablet, Rfl: 1  Physical exam:  Vitals:   06/25/22 1349  BP: (!) 102/50  Pulse: 73  Resp: 16  Temp: 98.5 F (36.9 C)  TempSrc: Oral  Weight: 224 lb 8 oz (101.8 kg)  Height: _0  (1.575 m)   Physical Exam Constitutional:      General: She is not in acute distress. Cardiovascular:     Rate and Rhythm: Normal rate and regular rhythm.     Heart sounds: Normal heart sounds.  Pulmonary:     Effort: Pulmonary effort is normal.     Breath sounds: Normal breath sounds.  Abdominal:     General: Bowel sounds are normal.     Palpations: Abdomen is soft.  Skin:    General: Skin is warm and dry.  Neurological:     Mental Status: She is alert and oriented to person, place, and time.         Latest Ref Rng &  Units 05/20/2022    9:14 AM  CMP  Glucose 70 - 99 mg/dL 167   BUN 8 - 23 mg/dL 13   Creatinine 0.44 - 1.00 mg/dL 1.15   Sodium 135 - 145 mmol/L 140   Potassium 3.5 - 5.1 mmol/L 3.9   Chloride 98 - 111 mmol/L 103   CO2 22 - 32 mmol/L 28   Calcium 8.9 - 10.3 mg/dL 9.2   Total Protein 6.5 - 8.1 g/dL 7.9   Total Bilirubin 0.3 - 1.2 mg/dL 1.1   Alkaline Phos 38 - 126 U/L 77   AST 15 - 41 U/L 22   ALT 0 - 44 U/L 17       Latest Ref Rng & Units 05/20/2022    9:14 AM  CBC  WBC 4.0 - 10.5 K/uL 5.4   Hemoglobin 12.0 - 15.0 g/dL 12.6   Hematocrit 36.0 - 46.0 % 40.0   Platelets 150 - 400 K/uL 186       LONG TERM MONITOR (3-14 DAYS)  Result Date: 06/22/2022   The patient was monitored for 13 days, 20 hours.   The predominant rhythm was sinus with an average rate of 82 bpm (range 65-129 bpm).   There were rare PACs and PVCs.   A single supraventricular run occurred, lasting 12 beats with a maximum rate of 128 bpm.   No sustained arrhythmia or  prolonged pause was identified.   Single patient triggered event corresponds to normal sinus rhythm. Predominantly sinus rhythm with rare PACs and PVCs as well as a single brief episode of PSVT.  No sustained arrhythmia identified.   NM PET Image Restag (PS) Skull Base To Thigh  Result Date: 06/12/2022 CLINICAL DATA:  Subsequent treatment strategy for non-small cell lung cancer. History of SBRT. EXAM: NUCLEAR MEDICINE PET SKULL BASE TO THIGH TECHNIQUE: 12.69 mCi F-18 FDG was injected intravenously. Full-ring PET imaging was performed from the skull base to thigh after the radiotracer. CT data was obtained and used for attenuation correction and anatomic localization. Fasting blood glucose: 155 mg/dl COMPARISON:  Prior PET-CT 01/07/2021 and most recent chest CT 05/21/2022 FINDINGS: Mediastinal blood pool activity: SUV max 2.37 Liver activity: SUV max NA NECK: No hypermetabolic lymph nodes in the neck. Incidental CT findings: None. CHEST: Large area soft  tissue density in the anterior aspect of the right upper lobe demonstrating marked hypermetabolism with SUV max of 14.86. Findings consistent with recurrent tumor. No definite hypermetabolic mediastinal or hilar nodes. No supraclavicular or axillary adenopathy. Persistent right pleural effusion worrisome for malignant effusion but no hypermetabolic pleural lesions are identified. Small scattered pulmonary nodules worrisome for metastatic disease and better seen on the recent chest CT. No hypermetabolic breast masses. Incidental CT findings: Stable pacer wires, aortic and coronary artery calcifications and surgical changes from mitral valve replacement surgery. ABDOMEN/PELVIS: No abnormal hypermetabolic activity within the liver, pancreas, adrenal glands, or spleen. No hypermetabolic lymph nodes in the abdomen or pelvis. Incidental CT findings: Stable aortic and iliac artery calcifications. Stable extensive surgical changes with hysterectomy and probable lymph node dissection. Small amount of free pelvic fluid noted. SKELETON: No findings suspicious for osseous metastatic disease. Incidental CT findings: Remote surgical changes involving the right shoulder. Age advanced degenerative changes involving both hips. IMPRESSION: 1. PET-CT findings consistent with recurrent right upper lobe neoplasm. 2. No mediastinal/hilar adenopathy or abdominal/pelvic metastatic disease. 3. Small bilateral pulmonary nodules suspicious for pulmonary metastatic disease. 4. Moderate-sized right pleural effusion new on the most recent chest CT and worrisome for malignant pleural effusion. 5. No findings for osseous metastatic disease. Electronically Signed   By: Marijo Sanes M.D.   On: 06/12/2022 19:10     Assessment and plan- Patient is a 61 y.o. 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  She is here to discuss PET CT scan results and further  management  I have reviewed PET CT scan images independently and discussed findings with the patient.  PET CT scan shows area of hypermetabolism involving her right upper lobe.  No mediastinal or hilar adenopathy.  Moderate size right pleural effusion.  Brain MRI is still pending.  NGS testing did not show any actionable mutations.  PD-L1 score was high at 60%.  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  Given that her PD-L1 expression is greater than 50% I would like to proceed with single agent Keytruda until progression or toxicity.  Discussed risks and benefits of immunotherapy including all but not limited to autoimmune side effects such as thyroiditis and other endocrinopathies, hepatitis colitis pneumonitis and need to monitor kidney and liver function.  Treatment will be given with a palliative intent.  Patient understands and agrees to proceed as planned.  Use of pembrolizumab for patients with PD-L1 expression ?50 percent is supported  by the phase III KEYNOTE-024 trial, in which 305 treatment-nave patients with advanced NSCLC having at least 50 percent tumor cell PD-L1 staining were randomly assigned to pembrolizumab monotherapy (200 mg intravenously every three weeks) versus standard platinum-doublet chemotherapy [38]. At a median follow-up of 11.2 months, PFS, the primary endpoint, was prolonged with pembrolizumab compared with platinum-doublet chemotherapy (median PFS 10.3 versus 6 months; HR 0.50, 95% CI 0.37-0.68), and ORRs were improved (45 versus 28 percent). The median duration of response was not reached in the pembrolizumab group and was 6.3 months in the chemotherapy group    Cancer Staging  Primary lung adenocarcinoma, right [facility]) Staging form: Lung, AJCC 8th Edition - Clinical stage from 06/08/2022: Stage IVA (cTX, cN2, pM1a) - Signed by Sindy Guadeloupe, MD on 06/08/2022      Visit Diagnosis 1. Primary lung adenocarcinoma, right  ([place])   2. Goals of care, counseling/discussion      Dr. Randa Evens, MD, MPH [facility] [phone] 06/27/2022 8:47 PM

## 2022-06-28 ENCOUNTER — Other Ambulatory Visit: Payer: Medicare HMO

## 2022-06-28 ENCOUNTER — Encounter: Payer: Self-pay | Admitting: Anesthesiology

## 2022-06-28 ENCOUNTER — Ambulatory Visit (HOSPITAL_COMMUNITY)
Admission: RE | Admit: 2022-06-28 | Discharge: 2022-06-28 | Disposition: A | Discharge status: Home / Self Care | Payer: Medicare HMO | Source: Ambulatory Visit | Attending: Oncology | Admitting: Oncology

## 2022-06-28 ENCOUNTER — Other Ambulatory Visit: Payer: Self-pay | Admitting: Internal Medicine

## 2022-06-28 DIAGNOSIS — I4729 Other ventricular tachycardia: Secondary | ICD-10-CM

## 2022-06-28 DIAGNOSIS — G9389 Other specified disorders of brain: Secondary | ICD-10-CM | POA: Diagnosis not present

## 2022-06-28 DIAGNOSIS — C3491 Malignant neoplasm of unspecified part of right bronchus or lung: Secondary | ICD-10-CM | POA: Diagnosis not present

## 2022-06-28 DIAGNOSIS — C3490 Malignant neoplasm of unspecified part of unspecified bronchus or lung: Secondary | ICD-10-CM

## 2022-06-28 MED ORDER — GADOBUTROL 1 MMOL/ML IV SOLN
10.0000 mL | Freq: Once | INTRAVENOUS | Status: AC | PRN
  Administered 2022-06-28: 10 mL via INTRAVENOUS

## 2022-06-28 NOTE — Progress Notes (Signed)
Patient for IR Port Insertion on Tues 06/29/22, I called and spoke with the patient on the phone and gave pre-procedure instructions. Pt was made aware to be here at [facility] at the new entrance, NPO after MN prior to procedure as well as driver post procedure/recovery/discharge. Pt stated understanding. Called 06/28/22

## 2022-06-28 NOTE — Progress Notes (Signed)
Informed of MRI for today.   Device system confirmed to be MRI conditional, with implant date > 6 weeks ago, and no evidence of abandoned or epicardial leads in review of most recent CXR Interrogation from today reviewed, pt is currently VS with minimal V pacing at baseline.   Change device settings for MRI to OVO.   Tachy-therapies to off if applicable.  Program device back to pre-MRI settings after completion of exam.  Shirley Friar, PA-C  06/28/2022 12:54 PM

## 2022-06-29 ENCOUNTER — Other Ambulatory Visit: Payer: Self-pay

## 2022-06-29 ENCOUNTER — Encounter: Payer: Self-pay | Admitting: Radiology

## 2022-06-29 ENCOUNTER — Ambulatory Visit
Admission: RE | Admit: 2022-06-29 | Discharge: 2022-06-29 | Disposition: A | Discharge status: Home / Self Care | Payer: Medicare HMO | Source: Ambulatory Visit | Attending: Oncology | Admitting: Oncology

## 2022-06-29 DIAGNOSIS — Z7901 Long term (current) use of anticoagulants: Secondary | ICD-10-CM | POA: Insufficient documentation

## 2022-06-29 DIAGNOSIS — Z95810 Presence of automatic (implantable) cardiac defibrillator: Secondary | ICD-10-CM | POA: Diagnosis not present

## 2022-06-29 DIAGNOSIS — Z87891 Personal history of nicotine dependence: Secondary | ICD-10-CM | POA: Insufficient documentation

## 2022-06-29 DIAGNOSIS — Z7982 Long term (current) use of aspirin: Secondary | ICD-10-CM | POA: Insufficient documentation

## 2022-06-29 DIAGNOSIS — I110 Hypertensive heart disease with heart failure: Secondary | ICD-10-CM | POA: Diagnosis not present

## 2022-06-29 DIAGNOSIS — I509 Heart failure, unspecified: Secondary | ICD-10-CM | POA: Insufficient documentation

## 2022-06-29 DIAGNOSIS — I2510 Atherosclerotic heart disease of native coronary artery without angina pectoris: Secondary | ICD-10-CM | POA: Diagnosis not present

## 2022-06-29 DIAGNOSIS — I252 Old myocardial infarction: Secondary | ICD-10-CM | POA: Insufficient documentation

## 2022-06-29 DIAGNOSIS — C3491 Malignant neoplasm of unspecified part of right bronchus or lung: Secondary | ICD-10-CM

## 2022-06-29 DIAGNOSIS — C3490 Malignant neoplasm of unspecified part of unspecified bronchus or lung: Secondary | ICD-10-CM

## 2022-06-29 DIAGNOSIS — Z452 Encounter for adjustment and management of vascular access device: Secondary | ICD-10-CM | POA: Diagnosis not present

## 2022-06-29 HISTORY — PX: IR IMAGING GUIDED PORT INSERTION: IMG5740

## 2022-06-29 MED ORDER — MIDAZOLAM HCL 2 MG/2ML IJ SOLN
INTRAMUSCULAR | Status: AC | PRN
  Administered 2022-06-29: 1 mg via INTRAVENOUS

## 2022-06-29 MED ORDER — FENTANYL CITRATE (PF) 100 MCG/2ML IJ SOLN
INTRAMUSCULAR | Status: AC
  Filled 2022-06-29: qty 2

## 2022-06-29 MED ORDER — FENTANYL CITRATE (PF) 100 MCG/2ML IJ SOLN
INTRAMUSCULAR | Status: AC | PRN
  Administered 2022-06-29: 50 ug via INTRAVENOUS

## 2022-06-29 MED ORDER — HEPARIN SOD (PORK) LOCK FLUSH 100 UNIT/ML IV SOLN
INTRAVENOUS | Status: AC
  Administered 2022-06-29: 500 [IU]
  Filled 2022-06-29: qty 5

## 2022-06-29 MED ORDER — SODIUM CHLORIDE 0.9 % IV SOLN: INTRAVENOUS | Status: DC

## 2022-06-29 MED ORDER — LIDOCAINE HCL 1 % IJ SOLN
INTRAMUSCULAR | Status: AC
  Administered 2022-06-29: 18 mL
  Filled 2022-06-29: qty 20

## 2022-06-29 MED ORDER — MIDAZOLAM HCL 2 MG/2ML IJ SOLN
INTRAMUSCULAR | Status: AC
  Filled 2022-06-29: qty 4

## 2022-06-29 NOTE — Procedures (Signed)
Interventional Radiology Procedure Note  Procedure: Placement of a right IJ approach single lumen PowerPort.  Tip is positioned at the superior cavoatrial junction and catheter is ready for immediate use.  Complications: None Recommendations:  - Ok to shower tomorrow - Do not submerge for 7 days - Routine line care   Signed,  Kairie Vangieson S. Talajah Slimp, DO   

## 2022-06-29 NOTE — H&P (Signed)
Chief Complaint: Chemotherapy access. Request is for portacath placement  Referring Physician(s): Rao,Archana C  Supervising Physician: Corrie Mckusick  Patient Status: [facility] - Out-pt  History of Present Illness: Vanessa Carlson is a 61 y.o. female 61 y.o. female outpatient. History of  HTN, CAD, CHF, cervical cancer, MI s/p ICD placement, NSCL s/p SBRT. PET scan from 10.14.23 shows recurrent RUL nodule. RIJ is accessible.  Currently without any significant complaints. Patient alert and laying in bed,calm. Denies any fevers, headache, chest pain, SOB, cough, abdominal pain, nausea, vomiting or bleeding. Return precautions and treatment recommendations and follow-up discussed with the patient  who is agreeable with the plan.  .  Past Medical History:  Diagnosis Date   AICD (automatic cardioverter/defibrillator) present    Anterior myocardial infarction [facility])    a. 10/2014 - occluded LAD, complicated by cardiogenic shock-->Med Rx as interventional team was unable to open LAD.   Arthritis    arthirits of knee on right   Cancer of upper lobe of right lung ([place]) 09/2017   radiation therapy right side   Cervical cancer (HCC)    in remission for ~20 yrs, s/p hysterectomy   Chronic combined systolic and diastolic CHF (congestive heart failure) ([place])    a. 10/2015 Echo: EF 20-25%, sev diast dysfxn, mildly reduced RV fxn. Nl MV prosthesis; b. 12/2017 Echo: EF 20-25%, diff HK. Sev ant/antsept HK, apical AK. Mild MS (mean graad 79mmHg). Mod TR. PASP 50mmHg.   Coronary artery disease    a. 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   Essential hypertension    Ischemic cardiomyopathy    a. 03/2015 s/p MDT single lead AICD;  b. 10/2015 Echo: EF 20-25%;  c. 12/2017 Echo: EF 20-25%, diff HK.   Mass of upper lobe of right lung    a. noted on CXR & CT 08/2017 w/ abnl PET CT.   Mitral valve disease    a. 2006 s/p  MVR w/ 30mm SJM bileaflet mechanical valve-->chronic coumadin/ASA; b. 12/2017 Echo: Mild MS. Mean gradient 62mmHg.   Personal history of radiation therapy    f/u lung ca    Past Surgical History:  Procedure Laterality Date   APPENDECTOMY  1998   BREAST EXCISIONAL BIOPSY Left 80s   benign   CARDIAC CATHETERIZATION     CARDIAC DEFIBRILLATOR PLACEMENT     COLONOSCOPY WITH PROPOFOL N/A 01/11/2020   Procedure: COLONOSCOPY WITH PROPOFOL;  Surgeon: Jonathon Bellows, MD;  Location: [facility] ENDOSCOPY;  Service: Gastroenterology;  Laterality: N/A;   RADICAL HYSTERECTOMY  1998   REPLACEMENT TOTAL KNEE Right 12/10/2021   SHOULDER SURGERY Bilateral    rotator cuff tears   VALVE REPLACEMENT     Mitral valve; 27 mm St. Jude bileaflet valve    Allergies: Patient has no known allergies.  Medications: Prior to Admission medications   Medication Sig Start Date End Date Taking? Authorizing Provider  aspirin 81 MG chewable tablet Chew 81 mg by mouth daily.    [provider]  atorvastatin (LIPITOR) 80 MG tablet TAKE 1 TABLET BY MOUTH ONCE DAILY. OFFICE VISIT AND LABS NEEDED FOR FURTHER REFILLS 06/06/22   Birdie Sons, MD  cyclobenzaprine (FLEXERIL) 5 MG tablet Take 1 tablet (5 mg total) by mouth 3 (three) times daily as needed for muscle spasms. 12/07/19   Burnette, Andra Heslin M, PA-C  ENTRESTO 24-26 MG TAKE 1 TABLET BY MOUTH TWICE DAILY *SCHEDULE OFFICE VISIT FOR FURTHER REFILLS*  02/12/22   End, Harrell Gave, MD  ezetimibe (ZETIA) 10 MG tablet TAKE 1 TABLET BY MOUTH ONCE DAILY . APPOINTMENT REQUIRED FOR FUTURE REFILLS 06/08/22   End, Harrell Gave, MD  fluticasone [facility]) 50 MCG/ACT nasal spray Place 2 sprays into both nostrils as needed for allergies or rhinitis.    [provider]  furosemide (LASIX) 40 MG tablet Take 2 tablets (80 mg total) by mouth 2 (two) times daily. 01/21/22   End, Harrell Gave, MD  gabapentin (NEURONTIN) 300 MG capsule Take 1 capsule (300 mg total) by mouth 2 (two) times  daily. 10/26/21   Virginia Crews, MD  lidocaine-prilocaine (EMLA) cream Apply to affected area once 06/25/22   Sindy Guadeloupe, MD  metolazone (ZAROXOLYN) 2.5 MG tablet Take 1 tablet (2.5 mg) by mouth once a week. Take 30 minutes prior to your furosemide dose. 05/20/22   End, Harrell Gave, MD  metoprolol succinate (TOPROL-XL) 25 MG 24 hr tablet TAKE 1 TABLET BY MOUTH TWICE DAILY IN THE MORNING AND AT BEDTIME 06/21/22   End, Harrell Gave, MD  ondansetron (ZOFRAN) 8 MG tablet Take 1 tablet (8 mg total) by mouth every 8 (eight) hours as needed for nausea or vomiting. 06/25/22   Sindy Guadeloupe, MD  oxybutynin (DITROPAN-XL) 10 MG 24 hr tablet Take 1 tablet (10 mg total) by mouth daily. 06/21/22   MacDiarmid, Nicki Reaper, MD  potassium chloride SA (KLOR-CON M) 20 MEQ tablet Take 2 tablets (40 mEq total) by mouth daily. 05/20/22   End, Harrell Gave, MD  prochlorperazine (COMPAZINE) 10 MG tablet Take 1 tablet (10 mg total) by mouth every 6 (six) hours as needed for nausea or vomiting. 06/25/22   Sindy Guadeloupe, MD  spironolactone (ALDACTONE) 25 MG tablet Take 1 tablet (25 mg total) by mouth daily. 04/27/22   Myles Gip, DO  traMADol (ULTRAM) 50 MG tablet Take 1 tablet (50 mg total) by mouth every 6 (six) hours as needed. 06/21/22   Virginia Crews, MD  warfarin (COUMADIN) 5 MG tablet 5 mg daily except 7.5 mg on Mon and Fri Patient taking differently: Take 5 mg by mouth daily. 5 mg daily except 7.5 mg on Mon and Fri 05/14/22   Mikey Kirschner, PA-C     Family History  Problem Relation Age of Onset   Heart attack Mother    Hypertension Mother    Diabetes Mother    Emphysema Father        smoker   Diabetes Maternal Grandmother    Kidney disease Maternal Grandmother    Breast cancer Maternal Aunt    Breast cancer Maternal Aunt    Colon cancer Maternal Uncle     Social History   Socioeconomic History   Marital status: Married    Spouse name: Tosin   Number of children: 2   Years of education:  14   Highest education level: Some college, no degree  Occupational History   Occupation: disable  Tobacco Use   Smoking status: Former    Packs/day: 1.00    Years: 4.00    Total pack years: 4.00    Types: Cigarettes    Quit date: 1998    Years since quitting: 25.8    Passive exposure: Past   Smokeless tobacco: Never  Vaping Use   Vaping Use: Never used  Substance and Sexual Activity   Alcohol use: No   Drug use: No   Sexual activity: Yes    Partners: Male    Birth control/protection: Surgical  Other Topics Concern  Not on file  Social History Narrative   Not on file   Social Determinants of Health   Financial Resource Strain: Low Risk  (10/29/2021)   Overall Financial Resource Strain (CARDIA)    Difficulty of Paying Living Expenses: Not hard at all  Food Insecurity: No Food Insecurity (10/29/2021)   Hunger Vital Sign    Worried About Running Out of Food in the Last Year: Never true    Ran Out of Food in the Last Year: Never true  Transportation Needs: No Transportation Needs (10/29/2021)   PRAPARE - Hydrologist (Medical): No    Lack of Transportation (Non-Medical): No  Physical Activity: Insufficiently Active (10/29/2021)   Exercise Vital Sign    Days of Exercise per Week: 2 days    Minutes of Exercise per Session: 20 min  Stress: No Stress Concern Present (10/29/2021)   [place]    Feeling of Stress : Not at all  Social Connections: Moderately Integrated (10/29/2021)   Social Connection and Isolation Panel [NHANES]    Frequency of Communication with Friends and Family: Three times a week    Frequency of Social Gatherings with Friends and Family: Once a week    Attends Religious Services: More than 4 times per year    Active Member of [employer] or Organizations: No    Attends Archivist Meetings: Never    Marital Status: Married    Review of Systems: A 12 point ROS  discussed and pertinent positives are indicated in the HPI above.  All other systems are negative.  Review of Systems  Constitutional:  Negative for fatigue and fever.  HENT:  Negative for congestion.   Respiratory:  Negative for cough and shortness of breath.   Gastrointestinal:  Negative for abdominal pain, diarrhea, nausea and vomiting.    Vital Signs: Pulse 93   Temp 98 F (36.7 C) (Oral)   Resp (!) 23   Ht 5\' 2"  (1.575 m)   Wt 223 lb (101.2 kg)   LMP  (LMP Unknown)   SpO2 96%   BMI 40.79 kg/m     Physical Exam Vitals and nursing note reviewed.  Constitutional:      Appearance: She is well-developed.  HENT:     Head: Normocephalic and atraumatic.  Eyes:     Conjunctiva/sclera: Conjunctivae normal.  Cardiovascular:     Rate and Rhythm: Normal rate and regular rhythm.     Heart sounds: Normal heart sounds.  Pulmonary:     Effort: Pulmonary effort is normal.     Breath sounds: Normal breath sounds.  Musculoskeletal:        General: Normal range of motion.     Cervical back: Normal range of motion.  Skin:    General: Skin is warm.  Neurological:     Mental Status: She is alert and oriented to person, place, and time.  Psychiatric:        Mood and Affect: Mood normal.        Behavior: Behavior normal.        Thought Content: Thought content normal.        Judgment: Judgment normal.   Imaging: LONG TERM MONITOR (3-14 DAYS)  Result Date: 06/22/2022   The patient was monitored for 13 days, 20 hours.   The predominant rhythm was sinus with an average rate of 82 bpm (range 65-129 bpm).   There were rare PACs and PVCs.  A single supraventricular run occurred, lasting 12 beats with a maximum rate of 128 bpm.   No sustained arrhythmia or prolonged pause was identified.   Single patient triggered event corresponds to normal sinus rhythm. Predominantly sinus rhythm with rare PACs and PVCs as well as a single brief episode of PSVT.  No sustained arrhythmia identified.    NM PET Image Restag (PS) Skull Base To Thigh  Result Date: 06/12/2022 CLINICAL DATA:  Subsequent treatment strategy for non-small cell lung cancer. History of SBRT. EXAM: NUCLEAR MEDICINE PET SKULL BASE TO THIGH TECHNIQUE: 12.69 mCi F-18 FDG was injected intravenously. Full-ring PET imaging was performed from the skull base to thigh after the radiotracer. CT data was obtained and used for attenuation correction and anatomic localization. Fasting blood glucose: 155 mg/dl COMPARISON:  Prior PET-CT 01/07/2021 and most recent chest CT 05/21/2022 FINDINGS: Mediastinal blood pool activity: SUV max 2.37 Liver activity: SUV max NA NECK: No hypermetabolic lymph nodes in the neck. Incidental CT findings: None. CHEST: Large area soft tissue density in the anterior aspect of the right upper lobe demonstrating marked hypermetabolism with SUV max of 14.86. Findings consistent with recurrent tumor. No definite hypermetabolic mediastinal or hilar nodes. No supraclavicular or axillary adenopathy. Persistent right pleural effusion worrisome for malignant effusion but no hypermetabolic pleural lesions are identified. Small scattered pulmonary nodules worrisome for metastatic disease and better seen on the recent chest CT. No hypermetabolic breast masses. Incidental CT findings: Stable pacer wires, aortic and coronary artery calcifications and surgical changes from mitral valve replacement surgery. ABDOMEN/PELVIS: No abnormal hypermetabolic activity within the liver, pancreas, adrenal glands, or spleen. No hypermetabolic lymph nodes in the abdomen or pelvis. Incidental CT findings: Stable aortic and iliac artery calcifications. Stable extensive surgical changes with hysterectomy and probable lymph node dissection. Small amount of free pelvic fluid noted. SKELETON: No findings suspicious for osseous metastatic disease. Incidental CT findings: Remote surgical changes involving the right shoulder. Age advanced degenerative changes  involving both hips. IMPRESSION: 1. PET-CT findings consistent with recurrent right upper lobe neoplasm. 2. No mediastinal/hilar adenopathy or abdominal/pelvic metastatic disease. 3. Small bilateral pulmonary nodules suspicious for pulmonary metastatic disease. 4. Moderate-sized right pleural effusion new on the most recent chest CT and worrisome for malignant pleural effusion. 5. No findings for osseous metastatic disease. Electronically Signed   By: Marijo Sanes M.D.   On: 06/12/2022 19:10    Labs:  CBC: Recent Labs    10/02/21 0927 05/20/22 0914  WBC 5.5 5.4  HGB 12.7 12.6  HCT 38.9 40.0  PLT 186 186    COAGS: Recent Labs    04/21/22 1017 05/12/22 1113 06/02/22 1015 06/23/22 1028  INR 2.7 3.8* 2.3 4.0    BMP: Recent Labs    10/02/21 0927 05/20/22 0914  NA 137 140  K 4.2 3.9  CL 102 103  CO2 28 28  GLUCOSE 176* 167*  BUN 14 13  CALCIUM 9.2 9.2  CREATININE 1.12* 1.15*  GFRNONAA 56* 54*    LIVER FUNCTION TESTS: Recent Labs    10/02/21 0927 05/20/22 0914  BILITOT 0.7 1.1  AST 24 22  ALT 18 17  ALKPHOS 70 77  PROT 8.4* 7.9  ALBUMIN 4.3 4.1    Assessment and Plan:  61 y.o. female outpatient. History of  HTN, CAD, CHF, cervical cancer, MI s/p ICD placement, NSCL s/p SBRT. PET scan from 10.14.23 shows recurrent RUL nodule. RIJ is accessible.   No recent labs. Patient is on coumadin and ASA. NKDA. Patient  has been NPO since midnight.   Risks and benefits of image guided port-a-catheter placement was discussed with the patient including, but not limited to bleeding, infection, pneumothorax, or fibrin sheath development and need for additional procedures.  All of the patient's questions were answered, patient is agreeable to proceed. Consent signed and in chart.   Thank you for this interesting consult.  I greatly enjoyed meeting Vanessa Carlson and look forward to participating in their care.  A copy of this report was sent to the requesting provider on  this date.  Electronically Signed: Jacqualine Mau, NP 06/29/2022, 9:50 AM   I spent a total of  30 Minutes   in face to face in clinical consultation, greater than 50% of which was counseling/coordinating care for portacath placement

## 2022-07-01 ENCOUNTER — Inpatient Hospital Stay: Payer: Medicare HMO | Attending: Oncology

## 2022-07-01 DIAGNOSIS — Z95810 Presence of automatic (implantable) cardiac defibrillator: Secondary | ICD-10-CM | POA: Insufficient documentation

## 2022-07-01 DIAGNOSIS — I255 Ischemic cardiomyopathy: Secondary | ICD-10-CM | POA: Insufficient documentation

## 2022-07-01 DIAGNOSIS — Z87891 Personal history of nicotine dependence: Secondary | ICD-10-CM | POA: Insufficient documentation

## 2022-07-01 DIAGNOSIS — Z79899 Other long term (current) drug therapy: Secondary | ICD-10-CM | POA: Insufficient documentation

## 2022-07-01 DIAGNOSIS — I5042 Chronic combined systolic (congestive) and diastolic (congestive) heart failure: Secondary | ICD-10-CM | POA: Insufficient documentation

## 2022-07-01 DIAGNOSIS — Z923 Personal history of irradiation: Secondary | ICD-10-CM | POA: Insufficient documentation

## 2022-07-01 DIAGNOSIS — I110 Hypertensive heart disease with heart failure: Secondary | ICD-10-CM | POA: Insufficient documentation

## 2022-07-01 DIAGNOSIS — I2510 Atherosclerotic heart disease of native coronary artery without angina pectoris: Secondary | ICD-10-CM | POA: Insufficient documentation

## 2022-07-01 DIAGNOSIS — M1990 Unspecified osteoarthritis, unspecified site: Secondary | ICD-10-CM | POA: Insufficient documentation

## 2022-07-01 DIAGNOSIS — R9089 Other abnormal findings on diagnostic imaging of central nervous system: Secondary | ICD-10-CM | POA: Insufficient documentation

## 2022-07-01 DIAGNOSIS — I252 Old myocardial infarction: Secondary | ICD-10-CM | POA: Insufficient documentation

## 2022-07-01 DIAGNOSIS — C3491 Malignant neoplasm of unspecified part of right bronchus or lung: Secondary | ICD-10-CM | POA: Insufficient documentation

## 2022-07-01 DIAGNOSIS — Z7901 Long term (current) use of anticoagulants: Secondary | ICD-10-CM | POA: Insufficient documentation

## 2022-07-01 DIAGNOSIS — Z5112 Encounter for antineoplastic immunotherapy: Secondary | ICD-10-CM | POA: Insufficient documentation

## 2022-07-01 DIAGNOSIS — Z7982 Long term (current) use of aspirin: Secondary | ICD-10-CM | POA: Insufficient documentation

## 2022-07-01 DIAGNOSIS — Z8541 Personal history of malignant neoplasm of cervix uteri: Secondary | ICD-10-CM | POA: Insufficient documentation

## 2022-07-01 DIAGNOSIS — Z952 Presence of prosthetic heart valve: Secondary | ICD-10-CM | POA: Insufficient documentation

## 2022-07-02 ENCOUNTER — Inpatient Hospital Stay: Payer: Medicare HMO

## 2022-07-02 VITALS — BP 131/70 | HR 83 | Temp 96.7°F

## 2022-07-02 DIAGNOSIS — Z7901 Long term (current) use of anticoagulants: Secondary | ICD-10-CM | POA: Diagnosis not present

## 2022-07-02 DIAGNOSIS — Z5112 Encounter for antineoplastic immunotherapy: Secondary | ICD-10-CM | POA: Diagnosis not present

## 2022-07-02 DIAGNOSIS — C3491 Malignant neoplasm of unspecified part of right bronchus or lung: Secondary | ICD-10-CM | POA: Diagnosis not present

## 2022-07-02 DIAGNOSIS — Z87891 Personal history of nicotine dependence: Secondary | ICD-10-CM | POA: Diagnosis not present

## 2022-07-02 DIAGNOSIS — Z79899 Other long term (current) drug therapy: Secondary | ICD-10-CM | POA: Diagnosis not present

## 2022-07-02 DIAGNOSIS — Z952 Presence of prosthetic heart valve: Secondary | ICD-10-CM | POA: Diagnosis not present

## 2022-07-02 DIAGNOSIS — I110 Hypertensive heart disease with heart failure: Secondary | ICD-10-CM | POA: Diagnosis not present

## 2022-07-02 DIAGNOSIS — I5042 Chronic combined systolic (congestive) and diastolic (congestive) heart failure: Secondary | ICD-10-CM | POA: Diagnosis not present

## 2022-07-02 DIAGNOSIS — I255 Ischemic cardiomyopathy: Secondary | ICD-10-CM | POA: Diagnosis not present

## 2022-07-02 DIAGNOSIS — Z95810 Presence of automatic (implantable) cardiac defibrillator: Secondary | ICD-10-CM | POA: Diagnosis not present

## 2022-07-02 DIAGNOSIS — R9089 Other abnormal findings on diagnostic imaging of central nervous system: Secondary | ICD-10-CM | POA: Diagnosis not present

## 2022-07-02 DIAGNOSIS — M1990 Unspecified osteoarthritis, unspecified site: Secondary | ICD-10-CM | POA: Diagnosis not present

## 2022-07-02 DIAGNOSIS — Z8541 Personal history of malignant neoplasm of cervix uteri: Secondary | ICD-10-CM | POA: Diagnosis not present

## 2022-07-02 DIAGNOSIS — I252 Old myocardial infarction: Secondary | ICD-10-CM | POA: Diagnosis not present

## 2022-07-02 DIAGNOSIS — Z7982 Long term (current) use of aspirin: Secondary | ICD-10-CM | POA: Diagnosis not present

## 2022-07-02 DIAGNOSIS — C3431 Malignant neoplasm of lower lobe, right bronchus or lung: Secondary | ICD-10-CM | POA: Diagnosis present

## 2022-07-02 DIAGNOSIS — Z923 Personal history of irradiation: Secondary | ICD-10-CM | POA: Diagnosis not present

## 2022-07-02 DIAGNOSIS — I2510 Atherosclerotic heart disease of native coronary artery without angina pectoris: Secondary | ICD-10-CM | POA: Diagnosis not present

## 2022-07-02 LAB — CBC WITH DIFFERENTIAL/PLATELET
Abs Immature Granulocytes: 0.03 10*3/uL (ref 0.00–0.07)
Basophils Absolute: 0 10*3/uL (ref 0.0–0.1)
Basophils Relative: 0 %
Eosinophils Absolute: 0.2 10*3/uL (ref 0.0–0.5)
Eosinophils Relative: 3 %
HCT: 39.6 % (ref 36.0–46.0)
Hemoglobin: 13.1 g/dL (ref 12.0–15.0)
Immature Granulocytes: 0 %
Lymphocytes Relative: 23 %
Lymphs Abs: 1.6 10*3/uL (ref 0.7–4.0)
MCH: 29.2 pg (ref 26.0–34.0)
MCHC: 33.1 g/dL (ref 30.0–36.0)
MCV: 88.4 fL (ref 80.0–100.0)
Monocytes Absolute: 0.7 10*3/uL (ref 0.1–1.0)
Monocytes Relative: 11 %
Neutro Abs: 4.4 10*3/uL (ref 1.7–7.7)
Neutrophils Relative %: 63 %
Platelets: 201 10*3/uL (ref 150–400)
RBC: 4.48 MIL/uL (ref 3.87–5.11)
RDW: 15.3 % (ref 11.5–15.5)
WBC: 7 10*3/uL (ref 4.0–10.5)
nRBC: 0 % (ref 0.0–0.2)

## 2022-07-02 LAB — COMPREHENSIVE METABOLIC PANEL
ALT: 15 U/L (ref 0–44)
AST: 26 U/L (ref 15–41)
Albumin: 4.2 g/dL (ref 3.5–5.0)
Alkaline Phosphatase: 105 U/L (ref 38–126)
Anion gap: 8 (ref 5–15)
BUN: 16 mg/dL (ref 8–23)
CO2: 26 mmol/L (ref 22–32)
Calcium: 9 mg/dL (ref 8.9–10.3)
Chloride: 103 mmol/L (ref 98–111)
Creatinine, Ser: 1.06 mg/dL — ABNORMAL HIGH (ref 0.44–1.00)
GFR, Estimated: 59 mL/min — ABNORMAL LOW (ref >60)
Glucose, Bld: 172 mg/dL — ABNORMAL HIGH (ref 70–99)
Potassium: 3.5 mmol/L (ref 3.5–5.1)
Sodium: 137 mmol/L (ref 135–145)
Total Bilirubin: 1.8 mg/dL — ABNORMAL HIGH (ref 0.3–1.2)
Total Protein: 8 g/dL (ref 6.5–8.1)

## 2022-07-02 LAB — TSH: TSH: 2.867 u[IU]/mL (ref 0.350–4.500)

## 2022-07-02 MED ORDER — SODIUM CHLORIDE 0.9 % IV SOLN
Freq: Once | INTRAVENOUS | Status: AC
  Filled 2022-07-02: qty 250

## 2022-07-02 MED ORDER — SODIUM CHLORIDE 0.9 % IV SOLN
200.0000 mg | Freq: Once | INTRAVENOUS | Status: AC
  Administered 2022-07-02: 200 mg via INTRAVENOUS
  Filled 2022-07-02: qty 8

## 2022-07-02 MED ORDER — HEPARIN SOD (PORK) LOCK FLUSH 100 UNIT/ML IV SOLN
500.0000 [IU] | Freq: Once | INTRAVENOUS | Status: AC | PRN
  Filled 2022-07-02: qty 5

## 2022-07-02 MED ORDER — HEPARIN SOD (PORK) LOCK FLUSH 100 UNIT/ML IV SOLN
INTRAVENOUS | Status: AC
  Administered 2022-07-02: 500 [IU]
  Filled 2022-07-02: qty 5

## 2022-07-03 LAB — T4: T4, Total: 8.6 ug/dL (ref 4.5–12.0)

## 2022-07-05 ENCOUNTER — Telehealth: Payer: Self-pay

## 2022-07-05 ENCOUNTER — Other Ambulatory Visit: Payer: Self-pay | Admitting: Family Medicine

## 2022-07-05 NOTE — Progress Notes (Unsigned)
Follow-up Outpatient Visit Date: 07/07/2022  Primary Care Provider: Virginia Crews, MD [address] [place] [address]  Chief Complaint: Shortness of breath  HPI:  Vanessa Carlson is a 61 y.o. 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  I last saw her in late September, at which time she noted more frequent palpitations and dyspnea on exertion.  We agreed to add metolazone 2.5 mg once a week.  Event monitor was placed at it was felt by EP that her single chamber device would not be able to adequately assess for paroxysmal atrial arrhythmias.  Event monitor subsequently showed predominantly sinus rhythm with rare PAC's and PVC's as well as a single brief episode of PSVT.  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  Today, Vanessa Carlson reports that she has been experiencing more shortness of breath over the last few weeks.  This initially improved a little bit with use of metolazone, though she has now run out of this.  Her breathing also improved quite a bit with thoracentesis in the late September.  She is not had any chest pain, palpitations, or lightheadedness.  She was just started on Keytruda.  She remains compliant with her cardiac medications.  She has not had any bleeding.  Home blood pressure is usually around 120/70.  --------------------------------------------------------------------------------------------------  Cardiovascular History &  Procedures: Cardiovascular Problems: Coronary artery disease status post anterior STEMI (2016) Ischemic cardiomyopathy with chronic systolic heart failure Rheumatic heart disease status post mechanical mitral valve replacement (2006)   Risk Factors: Known coronary artery disease and hypertension   Cath/PCI:  LHC (2016, [place]): Reportedly proximal occlusion of the LAD, which could not be crossed with a wire.   CV Surgery: Mitral valve replacement (2006, [facility]): 27 mm St. Jude bileaflet valve   EP Procedures and Devices: Event monitor (05/24/2022): Predominantly sinus rhythm with rare PACs and PVCs as well as a single brief episode of PSVT.  No sustained arrhythmia identified. ICD (04/01/15, [facility]): Medtronic single-chamber ICD   Non-Invasive Evaluation(s): TTE (01/28/2022): Mildly dilated LV with normal wall thickness.  LVEF 25-30% with global hypokinesis.  GLS -9%.  Normal RV size and function.  Moderate left atrial enlargement.  Mechanical mitral valve replacement was seen without regurgitation.  Transmitral gradients were not elevated. TTE (01/05/18): Mildly dilated LV.  LVEF 20 to 25% with diffuse hypokinesis and severe hypokinesis of the anterior and anteroseptal myocardium.  Apical akinesis.  Mechanical mitral valve with mean gradient of 4 mmHg.  Mild left atrial enlargement.  Normal RV function.  Moderate TR.  PASP 43 mmHg. TTE (11/20/15): Normal LV size and wall thickness. LVEF 20-25% with severe diastolic dysfunction. Mildly reduced RV contraction. Normal MV prosthesis function.  Recent CV Pertinent Labs: Lab Results  Component Value Date   CHOL 99 04/14/2021   CHOL 126 12/14/2019   HDL 34 (L) 04/14/2021   HDL 48 12/14/2019   LDLCALC 52 04/14/2021   LDLCALC 61 12/14/2019   LDLCALC 66 07/08/2017   TRIG 67 04/14/2021   CHOLHDL 2.9 04/14/2021   INR 2.8 07/07/2022  INR 4.0 06/23/2022   BNP 132.3 (H) 05/20/2022   K 3.5 07/02/2022   MG 2.2 05/20/2022   BUN 16 07/02/2022   BUN 14  12/19/2019   CREATININE 1.06 (H) 07/02/2022   CREATININE 1.14 (H) 07/08/2017    Past medical and surgical history were reviewed and updated in EPIC.  Current Meds  Medication Sig   aspirin 81 MG chewable tablet Chew 81 mg by mouth daily.   atorvastatin (LIPITOR) 80 MG tablet TAKE 1 TABLET BY MOUTH ONCE DAILY. OFFICE VISIT AND LABS NEEDED FOR FURTHER REFILLS   cyclobenzaprine (FLEXERIL) 5 MG tablet Take 1 tablet (5 mg total) by mouth 3 (three) times daily as needed for muscle spasms.   ENTRESTO 24-26 MG TAKE 1 TABLET BY MOUTH TWICE DAILY *SCHEDULE OFFICE VISIT FOR FURTHER REFILLS*   ezetimibe (ZETIA) 10 MG tablet TAKE 1 TABLET BY MOUTH ONCE DAILY . APPOINTMENT REQUIRED FOR FUTURE REFILLS   fluticasone (FLONASE) 50 MCG/ACT nasal spray Place 2 sprays into both nostrils as needed for allergies or rhinitis.   furosemide (LASIX) 40 MG tablet Take 2 tablets (80 mg total) by mouth 2 (two) times daily.   gabapentin (NEURONTIN) 300 MG capsule Take 1 capsule (300 mg total) by mouth 2 (two) times daily.   lidocaine-prilocaine (EMLA) cream Apply to affected area once   metolazone (ZAROXOLYN) 2.5 MG tablet Take 1 tablet (2.5 mg) by mouth once a week. Take 30 minutes prior to your furosemide dose.   metoprolol succinate (TOPROL-XL) 25 MG 24 hr tablet TAKE 1 TABLET BY MOUTH TWICE DAILY IN THE MORNING AND AT BEDTIME   ondansetron (ZOFRAN) 8 MG tablet Take 1 tablet (8 mg total) by mouth every 8 (eight) hours as needed for nausea or vomiting.   oxybutynin (DITROPAN-XL) 10 MG 24 hr tablet Take 1 tablet (10 mg total) by mouth daily.   potassium chloride SA (KLOR-CON M) 20 MEQ tablet Take 2 tablets (40 mEq total) by mouth daily.   prochlorperazine (COMPAZINE) 10 MG tablet Take 1 tablet (10 mg total) by mouth every 6 (six) hours as needed for nausea or vomiting.   spironolactone (ALDACTONE) 25 MG tablet Take 1 tablet (25 mg total) by mouth daily.   traMADol (ULTRAM) 50 MG tablet Take 1 tablet (50 mg total) by  mouth every 6 (six) hours as needed.   warfarin (COUMADIN) 5 MG tablet 5 mg daily except 7.5 mg on Mon and Fri (Patient taking differently: Take 5 mg by mouth daily.)    Allergies: Patient has no known allergies.  Social History   Tobacco Use   Smoking status: Former    Packs/day: 1.00    Years: 4.00    Total pack years: 4.00    Types: Cigarettes    Quit date: 1998    Years since quitting: 25.8    Passive exposure: Past   Smokeless tobacco: Never  Vaping Use   Vaping Use: Never used  Substance Use Topics   Alcohol use: No   Drug use: No    Family History  Problem Relation Age of Onset   Heart attack Mother    Hypertension Mother    Diabetes Mother    Emphysema Father        smoker   Diabetes Maternal Grandmother    Kidney disease Maternal Grandmother    Breast cancer Maternal Aunt    Breast cancer Maternal Aunt    Colon cancer Maternal Uncle     Review of Systems: A 12-system review of systems  was performed and was negative except as noted in the HPI.  --------------------------------------------------------------------------------------------------  Physical Exam: BP 100/60 (BP Location: Left Arm, Patient Position: Sitting, Cuff Size: Large)   Pulse 88   Ht 5\' 2"  (1.575 m)   Wt 220 lb (99.8 kg)   LMP  (LMP Unknown)   SpO2 97%   BMI 40.24 kg/m   General:  NAD. Neck: No JVD or HJR. Lungs: Absent breath sounds in the lower one third of the right lung.  Left lung is clear. Heart: Regular rate and rhythm with mechanical S1.  No murmurs, rubs, or gallops. Abdomen: Soft, nontender, nondistended. Extremities: No lower extremity edema.  EKG: Normal sinus rhythm with nonspecific T wave abnormality.  No change from 05/20/2022.  Lab Results  Component Value Date   WBC 7.0 07/02/2022   HGB 13.1 07/02/2022   HCT 39.6 07/02/2022   MCV 88.4 07/02/2022   PLT 201 07/02/2022    Lab Results  Component Value Date   NA 137 07/02/2022   K 3.5 07/02/2022   CL 103  07/02/2022   CO2 26 07/02/2022   BUN 16 07/02/2022   CREATININE 1.06 (H) 07/02/2022   GLUCOSE 172 (H) 07/02/2022   ALT 15 07/02/2022    Lab Results  Component Value Date   CHOL 99 04/14/2021   HDL 34 (L) 04/14/2021   LDLCALC 52 04/14/2021   TRIG 67 04/14/2021   CHOLHDL 2.9 04/14/2021    --------------------------------------------------------------------------------------------------  ASSESSMENT AND PLAN: Chronic HFrEF: Vanessa Carlson new appears euvolemic on exam other than concern for enlarging right pleural effusion by exam and history.  I suspect this is driven primarily by her lung cancer and not decompensated heart failure.  We discussed restarting metolazone but have agreed to defer this in favor of obtaining a chest radiograph today and repeating thoracentesis if there is evidence of significant pleural effusion.  Continue current GDMT and diuretic therapy, as soft blood pressure precludes dose escalation at this time.  Continue ICD management per device clinic.  Coronary artery disease: No angina reported.  Continue secondary prevention with aspirin, atorvastatin, and ezetimibe.  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  Recent brain MRI notable for possible remote cardioembolic strokes.  Paroxysmal atrial fibrillation: Recent event monitor did not show evidence of atrial fibrillation.  Continue indefinite anticoagulation with warfarin, as outlined above.  Metastatic adenocarcinoma of the lung and shortness of breath: Continue close monitoring and Keytruda therapy per Dr. Janese Banks.  As above, we will obtain a chest radiograph to reassess right pleural effusion.  I will touch base with Dr. Janese Banks after reviewing the images regarding utility of thoracentesis.  Follow-up: Return to clinic in 3 months.  [name], MD 07/07/2022 11:03 AM

## 2022-07-05 NOTE — Telephone Encounter (Signed)
Telephone call to patient for follow up after receiving first infusion.   Patient states infusion went great.  States eating good and drinking plenty of fluids.   Denies any nausea or vomiting.  Encouraged patient to call for any concerns or questions. 

## 2022-07-06 ENCOUNTER — Other Ambulatory Visit: Payer: Self-pay | Admitting: Radiation Therapy

## 2022-07-06 ENCOUNTER — Ambulatory Visit (INDEPENDENT_AMBULATORY_CARE_PROVIDER_SITE_OTHER): Payer: Medicare HMO

## 2022-07-06 DIAGNOSIS — I255 Ischemic cardiomyopathy: Secondary | ICD-10-CM | POA: Diagnosis not present

## 2022-07-07 ENCOUNTER — Ambulatory Visit: Payer: Medicare HMO | Attending: Internal Medicine | Admitting: Internal Medicine

## 2022-07-07 ENCOUNTER — Encounter: Payer: Self-pay | Admitting: Internal Medicine

## 2022-07-07 ENCOUNTER — Ambulatory Visit (INDEPENDENT_AMBULATORY_CARE_PROVIDER_SITE_OTHER): Payer: Medicare HMO | Admitting: Family Medicine

## 2022-07-07 ENCOUNTER — Other Ambulatory Visit: Payer: Self-pay | Admitting: *Deleted

## 2022-07-07 ENCOUNTER — Ambulatory Visit
Admission: RE | Admit: 2022-07-07 | Discharge: 2022-07-07 | Disposition: A | Discharge status: Home / Self Care | Payer: Medicare HMO | Source: Ambulatory Visit | Attending: Internal Medicine | Admitting: Internal Medicine

## 2022-07-07 VITALS — BP 100/60 | HR 88 | Ht 62.0 in | Wt 220.0 lb

## 2022-07-07 DIAGNOSIS — Z952 Presence of prosthetic heart valve: Secondary | ICD-10-CM

## 2022-07-07 DIAGNOSIS — I480 Paroxysmal atrial fibrillation: Secondary | ICD-10-CM

## 2022-07-07 DIAGNOSIS — J90 Pleural effusion, not elsewhere classified: Secondary | ICD-10-CM

## 2022-07-07 DIAGNOSIS — R0602 Shortness of breath: Secondary | ICD-10-CM | POA: Insufficient documentation

## 2022-07-07 DIAGNOSIS — C3490 Malignant neoplasm of unspecified part of unspecified bronchus or lung: Secondary | ICD-10-CM | POA: Diagnosis not present

## 2022-07-07 DIAGNOSIS — I5022 Chronic systolic (congestive) heart failure: Secondary | ICD-10-CM

## 2022-07-07 DIAGNOSIS — I2510 Atherosclerotic heart disease of native coronary artery without angina pectoris: Secondary | ICD-10-CM

## 2022-07-07 LAB — CUP PACEART REMOTE DEVICE CHECK
Battery Remaining Longevity: 51 mo
Battery Voltage: 2.98 V
Brady Statistic RV Percent Paced: 0 %
Date Time Interrogation Session: 20231107135734
HighPow Impedance: 96 Ohm
Implantable Lead Connection Status: 753985
Implantable Lead Implant Date: 20160802
Implantable Lead Location: 753860
Implantable Pulse Generator Implant Date: 20160802
Lead Channel Impedance Value: 551 Ohm
Lead Channel Impedance Value: 646 Ohm
Lead Channel Pacing Threshold Amplitude: 0.375 V
Lead Channel Pacing Threshold Pulse Width: 0.4 ms
Lead Channel Sensing Intrinsic Amplitude: 14.875 mV
Lead Channel Sensing Intrinsic Amplitude: 14.875 mV
Lead Channel Setting Pacing Amplitude: 2 V
Lead Channel Setting Pacing Pulse Width: 0.4 ms
Lead Channel Setting Sensing Sensitivity: 0.3 mV
Zone Setting Status: 755011
Zone Setting Status: 755011

## 2022-07-07 LAB — POCT INR
INR: 2.8 (ref 2.0–3.0)
PT: 18

## 2022-07-07 NOTE — Progress Notes (Signed)
Per Dr. Janese Banks, pt needs to be scheduled for thoracentesis of the right pleural effusion. Orders placed and pt will be called with appt once scheduled.

## 2022-07-07 NOTE — Patient Instructions (Signed)
Description   Change dose to 5 mg daily and follow up 08/04/2022.

## 2022-07-07 NOTE — Patient Instructions (Signed)
Medication Instructions:  Your Physician recommend you continue on your current medication as directed.    *If you need a refill on your cardiac medications before your next appointment, please call your pharmacy*   Lab Work: None ordered today   Testing/Procedures: A chest x-ray takes a picture of the organs and structures inside the chest, including the heart, lungs, and blood vessels. This test can show several things, including, whether the heart is enlarges; whether fluid is building up in the lungs; and whether pacemaker / defibrillator leads are still in place.  [facility]   Follow-Up: At [facility], you and your health needs are our priority.  As part of our continuing mission to provide you with exceptional heart care, we have created designated Provider Care Teams.  These Care Teams include your primary Cardiologist (physician) and Advanced Practice Providers (APPs -  Physician Assistants and Nurse Practitioners) who all work together to provide you with the care you need, when you need it.  We recommend signing up for the patient portal called "MyChart".  Sign up information is provided on this After Visit Summary.  MyChart is used to connect with patients for Virtual Visits (Telemedicine).  Patients are able to view lab/test results, encounter notes, upcoming appointments, etc.  Non-urgent messages can be sent to your provider as well.   To learn more about what you can do with MyChart, go to [email or web address].    Your next appointment:   3 month(s)  The format for your next appointment:   In Person  Provider:   You may see [name], MD or one of the following Advanced Practice Providers on your designated Care Team:   Murray Hodgkins, NP Christell Faith, PA-C Cadence Kathlen Mody, PA-C Gerrie Nordmann, NP

## 2022-07-07 NOTE — Progress Notes (Signed)
Patient is here for PT/INR.

## 2022-07-08 ENCOUNTER — Ambulatory Visit
Admission: RE | Admit: 2022-07-08 | Discharge: 2022-07-08 | Disposition: A | Discharge status: Home / Self Care | Payer: Medicare HMO | Source: Ambulatory Visit | Attending: Student | Admitting: Student

## 2022-07-08 ENCOUNTER — Ambulatory Visit
Admission: RE | Admit: 2022-07-08 | Discharge: 2022-07-08 | Disposition: A | Discharge status: Home / Self Care | Payer: Medicare HMO | Source: Ambulatory Visit | Attending: Oncology | Admitting: Oncology

## 2022-07-08 ENCOUNTER — Other Ambulatory Visit: Payer: Self-pay | Admitting: Student

## 2022-07-08 DIAGNOSIS — J90 Pleural effusion, not elsewhere classified: Secondary | ICD-10-CM | POA: Diagnosis not present

## 2022-07-08 MED ORDER — LIDOCAINE HCL (PF) 1 % IJ SOLN
7.0000 mL | Freq: Once | INTRAMUSCULAR | Status: AC
  Administered 2022-07-08: 7 mL via INTRADERMAL

## 2022-07-08 NOTE — Procedures (Signed)
PROCEDURE SUMMARY:  Successful image-guided right thoracentesis. Yielded 1.0L of amber fluid. Pt tolerated procedure well. No immediate complications. EBL = trace   Specimen was not sent for labs. CXR ordered.  Please see imaging section of Epic for full dictation.  Lura Em PA-C 07/08/2022 9:40 AM

## 2022-07-09 ENCOUNTER — Ambulatory Visit: Payer: Medicare HMO | Admitting: Physician Assistant

## 2022-07-11 ENCOUNTER — Other Ambulatory Visit: Payer: Self-pay | Admitting: Internal Medicine

## 2022-07-12 ENCOUNTER — Other Ambulatory Visit: Payer: Self-pay

## 2022-07-12 ENCOUNTER — Encounter: Payer: Self-pay | Admitting: Oncology

## 2022-07-12 ENCOUNTER — Inpatient Hospital Stay (HOSPITAL_BASED_OUTPATIENT_CLINIC_OR_DEPARTMENT_OTHER): Payer: Medicare HMO | Admitting: Internal Medicine

## 2022-07-12 ENCOUNTER — Inpatient Hospital Stay: Payer: Medicare HMO

## 2022-07-12 VITALS — BP 119/64 | HR 86 | Temp 99.0°F | Resp 16 | Wt 220.4 lb

## 2022-07-12 DIAGNOSIS — C3431 Malignant neoplasm of lower lobe, right bronchus or lung: Secondary | ICD-10-CM | POA: Diagnosis not present

## 2022-07-12 DIAGNOSIS — C3491 Malignant neoplasm of unspecified part of right bronchus or lung: Secondary | ICD-10-CM | POA: Diagnosis not present

## 2022-07-12 DIAGNOSIS — R9089 Other abnormal findings on diagnostic imaging of central nervous system: Secondary | ICD-10-CM | POA: Diagnosis not present

## 2022-07-12 DIAGNOSIS — I2510 Atherosclerotic heart disease of native coronary artery without angina pectoris: Secondary | ICD-10-CM | POA: Diagnosis not present

## 2022-07-12 DIAGNOSIS — M1990 Unspecified osteoarthritis, unspecified site: Secondary | ICD-10-CM | POA: Diagnosis not present

## 2022-07-12 DIAGNOSIS — I252 Old myocardial infarction: Secondary | ICD-10-CM | POA: Diagnosis not present

## 2022-07-12 DIAGNOSIS — I5042 Chronic combined systolic (congestive) and diastolic (congestive) heart failure: Secondary | ICD-10-CM | POA: Diagnosis not present

## 2022-07-12 DIAGNOSIS — I255 Ischemic cardiomyopathy: Secondary | ICD-10-CM | POA: Diagnosis not present

## 2022-07-12 DIAGNOSIS — I110 Hypertensive heart disease with heart failure: Secondary | ICD-10-CM | POA: Diagnosis not present

## 2022-07-12 DIAGNOSIS — Z5112 Encounter for antineoplastic immunotherapy: Secondary | ICD-10-CM | POA: Diagnosis not present

## 2022-07-12 MED ORDER — OXYCODONE HCL 5 MG PO TABS: 5.0000 mg | ORAL_TABLET | ORAL | 0 refills | Status: DC | PRN

## 2022-07-12 NOTE — Telephone Encounter (Signed)
Ask her stop tramadol and see if she would be willing to take oxycodone 5 mg Q6 prn 30 tab

## 2022-07-12 NOTE — Progress Notes (Signed)
[facility] [address], [place] [address] [phone]   New Patient Evaluation  Date of Service: 07/12/22 Patient Name: Vanessa Carlson Patient MRN: [id number] Patient DOB: [birth year] Provider: Ventura Sellers, MD  Identifying Statement:  Vanessa Carlson is a 61 y.o. female with Abnormal brain MRI who presents for initial consultation and evaluation regarding cancer associated neurologic deficits.    Referring Provider: Virginia Crews, [place] [address] [place],  [place] [address]  Primary Cancer:  Oncologic History: Oncology History  Primary lung adenocarcinoma, right ([place])  06/08/2022 Initial Diagnosis   Primary lung adenocarcinoma, right ([place])   06/08/2022 Cancer Staging   Staging form: Lung, AJCC 8th Edition - Clinical stage from 06/08/2022: Stage IVA (cTX, cN2, pM1a) - Signed by Sindy Guadeloupe, MD on 06/08/2022   07/02/2022 -  Chemotherapy   Patient is on Treatment Plan : LUNG NSCLC Pembrolizumab (200) q21d       History of Present Illness: The patient's records from the referring physician were obtained and reviewed and the patient interviewed to confirm this HPI.  Vanessa Carlson presents today to review abnormal brain MRI study.  She denies any neurologic symptoms; MRI was performed as lung cancer staging/workup only.    Medications: Current Outpatient Medications on File Prior to Visit  Medication Sig Dispense Refill   aspirin 81 MG chewable tablet Chew 81 mg by mouth daily.     atorvastatin (LIPITOR) 80 MG tablet TAKE 1 TABLET BY MOUTH ONCE DAILY. OFFICE VISIT AND LABS NEEDED FOR FURTHER REFILLS 30 tablet 0   cyclobenzaprine (FLEXERIL) 5 MG tablet Take 1 tablet (5 mg total) by mouth 3 (three) times daily as needed for muscle spasms. 30 tablet 1   ENTRESTO 24-26 MG TAKE 1 TABLET BY MOUTH TWICE DAILY *SCHEDULE OFFICE VISIT FOR FURTHER REFILLS* 180 tablet 1   ezetimibe (ZETIA) 10 MG tablet  Take 1 tablet (10 mg total) by mouth daily. 30 tablet 2   fluticasone (FLONASE) 50 MCG/ACT nasal spray Place 2 sprays into both nostrils as needed for allergies or rhinitis.     furosemide (LASIX) 40 MG tablet Take 2 tablets (80 mg total) by mouth 2 (two) times daily. 120 tablet 5   gabapentin (NEURONTIN) 300 MG capsule Take 1 capsule (300 mg total) by mouth 2 (two) times daily. 180 capsule 3   lidocaine-prilocaine (EMLA) cream Apply to affected area once 30 g 3   metolazone (ZAROXOLYN) 2.5 MG tablet Take 1 tablet (2.5 mg) by mouth once a week. Take 30 minutes prior to your furosemide dose. (Patient not taking: Reported on 07/07/2022) 12 tablet 0   metoprolol succinate (TOPROL-XL) 25 MG 24 hr tablet TAKE 1 TABLET BY MOUTH TWICE DAILY IN THE MORNING AND AT BEDTIME 180 tablet 0   ondansetron (ZOFRAN) 8 MG tablet Take 1 tablet (8 mg total) by mouth every 8 (eight) hours as needed for nausea or vomiting. 30 tablet 1   oxybutynin (DITROPAN-XL) 10 MG 24 hr tablet Take 1 tablet (10 mg total) by mouth daily. 30 tablet 11   potassium chloride SA (KLOR-CON M) 20 MEQ tablet Take 2 tablets (40 mEq total) by mouth daily. 60 tablet 6   prochlorperazine (COMPAZINE) 10 MG tablet Take 1 tablet (10 mg total) by mouth every 6 (six) hours as needed for nausea or vomiting. 30 tablet 1   spironolactone (ALDACTONE) 25 MG tablet Take 1 tablet (25 mg total) by mouth daily.  90 tablet 3   traMADol (ULTRAM) 50 MG tablet Take 1 tablet (50 mg total) by mouth every 6 (six) hours as needed. 30 tablet 0   warfarin (COUMADIN) 5 MG tablet 5 mg daily except 7.5 mg on Mon and Fri (Patient taking differently: Take 5 mg by mouth daily.) 90 tablet 1   No current facility-administered medications on file prior to visit.    Allergies: No Known Allergies Past Medical History:  Past Medical History:  Diagnosis Date   AICD (automatic cardioverter/defibrillator) present    Anterior myocardial infarction ([facility])    a. 10/2014 - occluded LAD,  complicated by cardiogenic shock-->Med Rx as interventional team was unable to open LAD.   Arthritis    arthirits of knee on right   Cancer of upper lobe of right lung ([facility]) 09/2017   radiation therapy right side   Cervical cancer ([facility])    in remission for ~20 yrs, s/p hysterectomy   Chronic combined systolic and diastolic CHF (congestive heart failure) ([place])    a. 10/2015 Echo: EF 20-25%, sev diast dysfxn, mildly reduced RV fxn. Nl MV prosthesis; b. 12/2017 Echo: EF 20-25%, diff HK. Sev ant/antsept HK, apical AK. Mild MS (mean graad 41mmHg). Mod TR. PASP 62mmHg.   Coronary artery disease    a. 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   Essential hypertension    Ischemic cardiomyopathy    a. 03/2015 s/p MDT single lead AICD;  b. 10/2015 Echo: EF 20-25%;  c. 12/2017 Echo: EF 20-25%, diff HK.   Mass of upper lobe of right lung    a. noted on CXR & CT 08/2017 w/ abnl PET CT.   Mitral valve disease    a. 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 12/2017 Echo: Mild MS. Mean gradient 36mmHg.   Personal history of radiation therapy    f/u lung ca   Past Surgical History:  Past Surgical History:  Procedure Laterality Date   APPENDECTOMY  1998   BREAST EXCISIONAL BIOPSY Left 80s   benign   CARDIAC CATHETERIZATION     CARDIAC DEFIBRILLATOR PLACEMENT     COLONOSCOPY WITH PROPOFOL N/A 01/11/2020   Procedure: COLONOSCOPY WITH PROPOFOL;  Surgeon: Jonathon Bellows, MD;  [facility] END[facility];  Service: Gastroenterology;  Laterality: N/A;   IR IMAGING GUIDED PORT INSERTION  06/29/2022   PORTA CATH INSERTION     RADICAL HYSTERECTOMY  1998   REPLACEMENT TOTAL KNEE Right 12/10/2021   SHOULDER SURGERY Bilateral    rotator cuff tears   VALVE REPLACEMENT     Mitral valve; 27 mm [employer]. Jude bileaflet valve   Social History:  Social History   Socioeconomic History   Marital status:  Married    Spouse name: Tosin   Number of children: 2   Years of education: 14   Highest education level: Some college, no degree  Occupational History   Occupation: disable  Tobacco Use   Smoking status: Former    Packs/day: 1.00    Years: 4.00    Total pack years: 4.00    Types: Cigarettes    Quit date: 1998    Years since quitting: 25.8    Passive exposure: Past   Smokeless tobacco: Never  Vaping Use   Vaping Use: Never used  Substance and Sexual Activity   Alcohol use: No   Drug use: No   Sexual activity: Yes    Partners:  Male    Birth control/protection: Surgical  Other Topics Concern   Not on file  Social History Narrative   Not on file   Social Determinants of Health   Financial Resource Strain: Low Risk  (10/29/2021)   Overall Financial Resource Strain (CARDIA)    Difficulty of Paying Living Expenses: Not hard at all  Food Insecurity: No Food Insecurity (10/29/2021)   Hunger Vital Sign    Worried About Running Out of Food in the Last Year: Never true    Ran Out of Food in the Last Year: Never true  Transportation Needs: No Transportation Needs (10/29/2021)   PRAPARE - Hydrologist (Medical): No    Lack of Transportation (Non-Medical): No  Physical Activity: Insufficiently Active (10/29/2021)   Exercise Vital Sign    Days of Exercise per Week: 2 days    Minutes of Exercise per Session: 20 min  Stress: No Stress Concern Present (10/29/2021)   [place]    Feeling of Stress : Not at all  Social Connections: Moderately Integrated (10/29/2021)   Social Connection and Isolation Panel [NHANES]    Frequency of Communication with Friends and Family: Three times a week    Frequency of Social Gatherings with Friends and Family: Once a week    Attends Religious Services: More than 4 times per year    Active Member of [employer] or Organizations: No    Attends Archivist  Meetings: Never    Marital Status: Married  Human resources officer Violence: Not At Risk (10/29/2021)   Humiliation, Afraid, Rape, and Kick questionnaire    Fear of Current or Ex-Partner: No    Emotionally Abused: No    Physically Abused: No    Sexually Abused: No   Family History:  Family History  Problem Relation Age of Onset   Heart attack Mother    Hypertension Mother    Diabetes Mother    Emphysema Father        smoker   Diabetes Maternal Grandmother    Kidney disease Maternal Grandmother    Breast cancer Maternal Aunt    Breast cancer Maternal Aunt    Colon cancer Maternal Uncle     Review of Systems: Constitutional: Doesn't report fevers, chills or abnormal weight loss Eyes: Doesn't report blurriness of vision Ears, nose, mouth, throat, and face: Doesn't report sore throat Respiratory: Doesn't report cough, dyspnea or wheezes Cardiovascular: Doesn't report palpitation, chest discomfort  Gastrointestinal:  Doesn't report nausea, constipation, diarrhea GU: Doesn't report incontinence Skin: Doesn't report skin rashes Neurological: Per HPI Musculoskeletal: Doesn't report joint pain Behavioral/Psych: Doesn't report anxiety  Physical Exam: Vitals:   07/12/22 1413  BP: 119/64  Pulse: 86  Resp: 16  Temp: 99 F (37.2 C)  SpO2: 99%   KPS: 90. General: Alert, cooperative, pleasant, in no acute distress Head: Normal EENT: No conjunctival injection or scleral icterus.  Lungs: Resp effort normal Cardiac: Regular rate Abdomen: Non-distended abdomen Skin: No rashes cyanosis or petechiae. Extremities: No clubbing or edema  Neurologic Exam: Mental Status: Awake, alert, attentive to examiner. Oriented to self and environment. Language is fluent with intact comprehension.  Cranial Nerves: Visual acuity is grossly normal. Visual fields are full. Extra-ocular movements intact. No ptosis. Face is symmetric Motor: Tone and bulk are normal. Power is full in both arms and legs.  Reflexes are symmetric, no pathologic reflexes present.  Sensory: Intact to light touch Gait: Normal.   Labs:  I have reviewed the data as listed    Component Value Date/Time   NA 137 07/02/2022 0800   NA 145 (H) 12/19/2019 1118   K 3.5 07/02/2022 0800   CL 103 07/02/2022 0800   CO2 26 07/02/2022 0800   GLUCOSE 172 (H) 07/02/2022 0800   BUN 16 07/02/2022 0800   BUN 14 12/19/2019 1118   CREATININE 1.06 (H) 07/02/2022 0800   CREATININE 1.14 (H) 07/08/2017 1127   CALCIUM 9.0 07/02/2022 0800   PROT 8.0 07/02/2022 0800   PROT 7.0 08/18/2018 0957   ALBUMIN 4.2 07/02/2022 0800   ALBUMIN 4.2 08/18/2018 0957   AST 26 07/02/2022 0800   ALT 15 07/02/2022 0800   ALKPHOS 105 07/02/2022 0800   BILITOT 1.8 (H) 07/02/2022 0800   BILITOT 0.6 08/18/2018 0957   GFRNONAA 59 (L) 07/02/2022 0800   GFRNONAA 53 (L) 07/08/2017 1127   GFRAA >60 03/19/2020 0825   GFRAA 62 07/08/2017 1127   Lab Results  Component Value Date   WBC 7.0 07/02/2022   NEUTROABS 4.4 07/02/2022   HGB 13.1 07/02/2022   HCT 39.6 07/02/2022   MCV 88.4 07/02/2022   PLT 201 07/02/2022    Imaging:  US THORACENTESIS ASP PLEURAL SPACE W/IMG GUIDE  Result Date: 07/08/2022 INDICATION: Right pleural effusion EXAM: ULTRASOUND GUIDED RIGHT THORACENTESIS MEDICATIONS: 7 cc 1% lidocaine COMPLICATIONS: None immediate. PROCEDURE: An ultrasound guided thoracentesis was thoroughly discussed with the patient and questions answered. The benefits, risks, alternatives and complications were also discussed. The patient understands and wishes to proceed with the procedure. Written consent was obtained. Ultrasound was performed to localize and mark an adequate pocket of fluid in the right chest. The area was then prepped and draped in the normal sterile fashion. 1% Lidocaine was used for local anesthesia. Under ultrasound guidance a 6 Fr Safe-T-Centesis catheter was introduced. Thoracentesis was performed. The catheter was removed and a dressing  applied. FINDINGS: A total of approximately 1.0 L of amber fluid was removed. Ordering provider did not request laboratory samples IMPRESSION: Successful ultrasound guided right thoracentesis yielding 1.0 L of pleural fluid. Follow-up chest x-ray revealed no evidence of pneumothorax. Read by: Reatha Armour, PA-C Electronically Signed   By: Jerilynn Mages.  Shick M.D.   On: 07/08/2022 11:09   DG Chest 2 View  Result Date: 07/08/2022 CLINICAL DATA:  Shortness of breath, pleural effusion EXAM: CHEST - 2 VIEW COMPARISON:  05/26/2022 FINDINGS: There is marked increase in right pleural effusion. There is opacification of right mid and right lower lung fields. Evaluation of underlying lung for infiltrates is limited. Left lung is clear. There is homogeneous opacity, possibly neoplastic process in right upper lobe. There is prosthetic cardiac valve. Pacemaker/defibrillator battery is seen in the left infraclavicular region. Tip of right IJ chest port is seen in superior vena cava. IMPRESSION: There is significant interval increase in right pleural effusion. There is moderate to large right pleural effusion. Homogeneous opacity in right upper lung fields suggests possible neoplastic process. Left lung is clear. Electronically Signed   By: Elmer Picker M.D.   On: 07/08/2022 09:00   DG Chest Port 1 View  Result Date: 07/08/2022 CLINICAL DATA:  Status post right thoracentesis EXAM: PORTABLE CHEST 1 VIEW COMPARISON:  Previous studies including the examination of 07/07/2022 FINDINGS: There is significant interval decrease in moderate to large right pleural effusion. Small effusion is present in the right lower lung field. There is homogeneous density in right upper lung field, possibly neoplasm. Left lung is essentially clear.  Left lateral CP angle is clear. There is no pneumothorax. Tip of right IJ central venous catheter is seen in superior vena cava. Pacemaker/defibrillator battery is seen in left infraclavicular region.  Prosthetic cardiac valve is seen. IMPRESSION: There is significant interval decrease in right pleural effusion after thoracentesis. There is small residual right pleural effusion. There is no pneumothorax. There is homogeneous opacity in right upper lung fields suggesting possible neoplastic process. Electronically Signed   By: Elmer Picker M.D.   On: 07/08/2022 08:58   CUP PACEART REMOTE DEVICE CHECK  Result Date: 07/07/2022 Scheduled remote reviewed. Normal device function.  Next remote 91 days. LA  MR BRAIN W WO CONTRAST  Addendum Date: 06/30/2022   ADDENDUM REPORT: 06/30/2022 17:00 ADDENDUM: These results were discussed by telephone at the time of interpretation on 06/30/2022 at 5:00 pm to provider [place] , who verbally acknowledged these results. Electronically Signed   By: Pedro Earls M.D.   On: 06/30/2022 17:00   Result Date: 06/30/2022 CLINICAL DATA:  Non-small cell cancer of right lung. EXAM: MRI HEAD WITHOUT AND WITH CONTRAST TECHNIQUE: Multiplanar, multiecho pulse sequences of the brain and surrounding structures were obtained without and with intravenous contrast. CONTRAST:  71mL GADAVIST GADOBUTROL 1 MMOL/ML IV SOLN COMPARISON:  None Available. FINDINGS: Brain: No acute infarction, hemorrhage, hydrocephalus or extra-axial collection. Small remote cortical infarct in the right superior frontal gyrus. A 6 mm lesion is seen in the inferior aspect of the right temporal lobe with lobulated contours and increased signal on T1 and T2 and associated susceptibility artifact, suggestive of a small cavernoma. On the diffusion-weighted images there are innumerable of punctate foci of susceptibility artifact scattered throughout the bilateral cerebral and cerebellar hemispheres. No corresponding T1 or T2 signal abnormality seen. On axial postcontrast images, innumerable punctate foci of faint contrast enhancement is seen scattered throughout the brain parenchyma. These do not  correlate with the foci of susceptibility artifact. There is no edema or mass effect. Vascular: Normal flow voids. Skull and upper cervical spine: Diffuse decrease of the T1 signal in the visualized upper cervical spine may represent red marrow reconversion. However, marrow replacement pathologies cannot be entirely excluded. Sinuses/Orbits: Negative. Other: None. IMPRESSION: 1. Innumerable punctate foci of susceptibility artifact scattered throughout the bilateral cerebral and cerebellar hemispheres without corresponding contrast enhancement. Findings are concerning for possible multiple remote hemorrhagic emboli given history of mechanical valve versus familial cavernoma, considering lesion in the right temporal lobe is suggestive of a small cavernoma. 2. Innumerable punctate foci of faint contrast enhancement scattered throughout the brain parenchyma in a miliary pattern without associated edema or T2 signal abnormality. Findings are nonspecific and could be related to late subacute embolic infarcts, granulomatous, infectious and autoimmune disease. However, the possibility of metastatic lesions cannot be entirely excluded. A repeat MRI of the brain within 6-8 weeks is recommended. CSF sampling could also be considered if clinically appropriate. 3. Small remote cortical infarct in the right superior frontal gyrus. 4. Diffuse decrease of the T1 signal in the visualized upper cervical spine may represent red marrow reconversion. However, marrow replacement pathologies cannot be entirely excluded. Electronically Signed: By: Pedro Earls M.D. On: 06/30/2022 16:36   IR IMAGING GUIDED PORT INSERTION  Result Date: 06/29/2022 INDICATION: 61 year old female referred for port catheter EXAM: IMAGE GUIDED PORT CATHETER MEDICATIONS: None ANESTHESIA/SEDATION: Moderate (conscious) sedation was employed during this procedure. A total of Versed 1.0 mg and Fentanyl 25 mcg was administered intravenously.  Moderate Sedation Time: 32  minutes. The patient's level of consciousness and vital signs were monitored continuously by radiology nursing throughout the procedure under my direct supervision. FLUOROSCOPY TIME:  Fluoroscopy Time: 0 minutes 12 seconds (3.5 mGy). COMPLICATIONS: None PROCEDURE: Informed written consent was obtained from the patient after a thorough discussion of the procedural risks, benefits and alternatives. All questions were addressed. Maximal Sterile Barrier Technique was utilized including caps, mask, sterile gowns, sterile gloves, sterile drape, hand hygiene and skin antiseptic. A timeout was performed prior to the initiation of the procedure. Patient has left-sided pacing device/AICD. The procedure, risks, benefits, and alternatives were explained to the patient. Questions regarding the procedure were encouraged and answered. The patient understands and consents to the procedure. Ultrasound survey was performed with images stored and sent to PACs. Right IJ vein documented to be patent. The right neck and chest was prepped with chlorhexidine, and draped in the usual sterile fashion using maximum barrier technique (cap and mask, sterile gown, sterile gloves, large sterile sheet, hand hygiene and cutaneous antiseptic). Local anesthesia was attained by infiltration with 1% lidocaine without epinephrine. Ultrasound demonstrated patency of the right internal jugular vein, and this was documented with an image. Under real-time ultrasound guidance, this vein was accessed with a 21 gauge micropuncture needle and image documentation was performed. A small dermatotomy was made at the access site with an 11 scalpel. A 0.018" wire was advanced into the SVC and used to estimate the length of the internal catheter. The access needle exchanged for a 30F micropuncture vascular sheath. The 0.018" wire was then removed and a 0.035" wire advanced into the IVC. An appropriate location for the subcutaneous reservoir  was selected below the clavicle and an incision was made through the skin and underlying soft tissues. The subcutaneous tissues were then dissected using a combination of blunt and sharp surgical technique and a pocket was formed. A single lumen power injectable portacatheter was then tunneled through the subcutaneous tissues from the pocket to the dermatotomy and the port reservoir placed within the subcutaneous pocket. The venous access site was then serially dilated and a peel away vascular sheath placed over the wire. The wire was removed and the port catheter advanced into position under fluoroscopic guidance. The catheter tip is positioned in the cavoatrial junction. This was documented with a spot image. The portacatheter was then tested and found to flush and aspirate well. The port was flushed with saline followed by 100 units/mL heparinized saline. The pocket was then closed in two layers using first subdermal inverted interrupted absorbable sutures followed by a running subcuticular suture. The epidermis was then sealed with Dermabond. The dermatotomy at the venous access site was also seal with Dermabond. Steri-Strips were placed. Patient tolerated the procedure well and remained hemodynamically stable throughout. No complications encountered and no significant blood loss encountered IMPRESSION: Status post right IJ port catheter placement. Signed, Dulcy Fanny. Nadene Rubins, RPVI [facility] Electronically Signed   By: Corrie Mckusick D.O.   On: 06/29/2022 10:55   LONG TERM MONITOR (3-14 DAYS)  Result Date: 06/22/2022   The patient was monitored for 13 days, 20 hours.   The predominant rhythm was sinus with an average rate of 82 bpm (range 65-129 bpm).   There were rare PACs and PVCs.   A single supraventricular run occurred, lasting 12 beats with a maximum rate of 128 bpm.   No sustained arrhythmia or prolonged pause was identified.   Single  patient triggered event  corresponds to normal sinus rhythm. Predominantly sinus rhythm with rare PACs and PVCs as well as a single brief episode of PSVT.  No sustained arrhythmia identified.      Assessment/Plan Abnormal brain MRI  Vanessa Carlson presents today without clinical/focal neurologic syndrome.  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  Less likely is cerebral amyloid angiopathy, or remote hx of hemorrhagic emboli.  Faint contrast enhancement is noted.  We will therefore recommend repeating MRI with and w/o contrast in 2 months for further characterization.  Case was discussed in detail in brain/spine tumor board on 07/12/22.  Patient is agreeable with this plan.  We spent twenty additional minutes teaching regarding the natural history, biology, and historical experience in the treatment of neurologic complications of cancer.   We appreciate the opportunity to participate in the care of Vanessa Carlson.   We ask that Vanessa Carlson return to clinic in  2  months following next brain MRI, or sooner as needed.  All questions were answered. The patient knows to call the clinic with any problems, questions or concerns. No barriers to learning were detected.  The total time spent in the encounter was 45 minutes and more than 50% was on counseling and review of test results   Ventura Sellers, MD Medical Director of Neuro-Oncology [facility] at [facility] [place] 07/12/22 1:59 PM

## 2022-07-13 ENCOUNTER — Other Ambulatory Visit: Payer: Self-pay | Admitting: Radiation Therapy

## 2022-07-19 ENCOUNTER — Ambulatory Visit (INDEPENDENT_AMBULATORY_CARE_PROVIDER_SITE_OTHER): Payer: Medicare HMO

## 2022-07-19 DIAGNOSIS — Z95810 Presence of automatic (implantable) cardiac defibrillator: Secondary | ICD-10-CM | POA: Diagnosis not present

## 2022-07-19 DIAGNOSIS — I5022 Chronic systolic (congestive) heart failure: Secondary | ICD-10-CM

## 2022-07-21 ENCOUNTER — Telehealth: Payer: Self-pay

## 2022-07-21 NOTE — Telephone Encounter (Signed)
Remote ICM transmission received.  Attempted call to patient regarding ICM remote transmission and no answer.  

## 2022-07-21 NOTE — Progress Notes (Signed)
EPIC Encounter for ICM Monitoring  Patient Name: Vanessa [facility] is a 61 y.o. female Date: 07/21/2022 Primary Care Physican: Virginia Crews, MD Primary Cardiologist: Carlis Abbott, NP HF Electrophysiologist: Caryl Comes 02/04/2022 Office Weight: 228 lbs 02/24/2022 Weight: 226 lbs         Attempted call to patient and unable to reach.   Transmission reviewed.    Optivol thoracic impedance suggesting normal fluid levels.    Prescribed:  Furosemide 40 mg 2 tablets (80 mg total) by mouth two times daily Spironolactone 25 mg take 1 tablet daily   Labs: 07/02/2022 Creatinine 1.06, BUN 16, Potassium 3.5, Sodium 137, GFR 59 05/20/2022 Creatinine 1.15, BUN 13, Potassium 3.9, Sodium 140, GFR 54 10/02/2021 Creatinine 1.12, BUN 14, Potassium 4.2, Sodium 137, GFR 56 A complete set of results can be found in Results Review.   Recommendations:  Unable to reach.     Follow-up plan: ICM clinic phone appointment on 09/06/2022.   91 day device clinic remote transmission 10/05/2022.       EP/Cardiology Office Visits:  Recall 03/23/2023 with Dr. Caryl Comes.  10/04/2022 with Dr End.   Copy of ICM check sent to Dr. Caryl Comes.     3 month ICM trend: 07/19/2022.    12-14 Month ICM trend:     [name], RN 07/21/2022 11:44 AM

## 2022-07-24 ENCOUNTER — Encounter: Payer: Self-pay | Admitting: Family Medicine

## 2022-07-24 ENCOUNTER — Other Ambulatory Visit: Payer: Self-pay | Admitting: Family Medicine

## 2022-07-26 MED ORDER — ATORVASTATIN CALCIUM 80 MG PO TABS: ORAL_TABLET | ORAL | 0 refills | Status: DC

## 2022-07-27 ENCOUNTER — Other Ambulatory Visit: Payer: Self-pay | Admitting: Family Medicine

## 2022-07-27 ENCOUNTER — Encounter: Payer: Self-pay | Admitting: Family Medicine

## 2022-07-30 ENCOUNTER — Inpatient Hospital Stay (HOSPITAL_BASED_OUTPATIENT_CLINIC_OR_DEPARTMENT_OTHER): Payer: Medicare HMO | Admitting: Medical Oncology

## 2022-07-30 ENCOUNTER — Inpatient Hospital Stay: Payer: Medicare HMO

## 2022-07-30 ENCOUNTER — Inpatient Hospital Stay: Payer: Medicare HMO | Attending: Oncology

## 2022-07-30 ENCOUNTER — Encounter: Payer: Self-pay | Admitting: Medical Oncology

## 2022-07-30 ENCOUNTER — Ambulatory Visit: Payer: Medicare HMO | Admitting: Oncology

## 2022-07-30 ENCOUNTER — Ambulatory Visit: Payer: Medicare HMO | Admitting: Internal Medicine

## 2022-07-30 DIAGNOSIS — C3491 Malignant neoplasm of unspecified part of right bronchus or lung: Secondary | ICD-10-CM

## 2022-07-30 DIAGNOSIS — C3431 Malignant neoplasm of lower lobe, right bronchus or lung: Secondary | ICD-10-CM | POA: Insufficient documentation

## 2022-07-30 DIAGNOSIS — Z5112 Encounter for antineoplastic immunotherapy: Secondary | ICD-10-CM | POA: Diagnosis not present

## 2022-07-30 DIAGNOSIS — Z79899 Other long term (current) drug therapy: Secondary | ICD-10-CM | POA: Diagnosis not present

## 2022-07-30 DIAGNOSIS — Z87891 Personal history of nicotine dependence: Secondary | ICD-10-CM | POA: Insufficient documentation

## 2022-07-30 LAB — COMPREHENSIVE METABOLIC PANEL
ALT: 16 U/L (ref 0–44)
AST: 27 U/L (ref 15–41)
Albumin: 4.1 g/dL (ref 3.5–5.0)
Alkaline Phosphatase: 131 U/L — ABNORMAL HIGH (ref 38–126)
Anion gap: 8 (ref 5–15)
BUN: 11 mg/dL (ref 8–23)
CO2: 26 mmol/L (ref 22–32)
Calcium: 8.9 mg/dL (ref 8.9–10.3)
Chloride: 102 mmol/L (ref 98–111)
Creatinine, Ser: 0.94 mg/dL (ref 0.44–1.00)
GFR, Estimated: >60 mL/min (ref >60)
Glucose, Bld: 164 mg/dL — ABNORMAL HIGH (ref 70–99)
Potassium: 3.4 mmol/L — ABNORMAL LOW (ref 3.5–5.1)
Sodium: 136 mmol/L (ref 135–145)
Total Bilirubin: 1.8 mg/dL — ABNORMAL HIGH (ref 0.3–1.2)
Total Protein: 8 g/dL (ref 6.5–8.1)

## 2022-07-30 LAB — CBC WITH DIFFERENTIAL/PLATELET
Abs Immature Granulocytes: 0.02 10*3/uL (ref 0.00–0.07)
Basophils Absolute: 0 10*3/uL (ref 0.0–0.1)
Basophils Relative: 1 %
Eosinophils Absolute: 0.4 10*3/uL (ref 0.0–0.5)
Eosinophils Relative: 5 %
HCT: 41.8 % (ref 36.0–46.0)
Hemoglobin: 13.7 g/dL (ref 12.0–15.0)
Immature Granulocytes: 0 %
Lymphocytes Relative: 24 %
Lymphs Abs: 1.7 10*3/uL (ref 0.7–4.0)
MCH: 28.8 pg (ref 26.0–34.0)
MCHC: 32.8 g/dL (ref 30.0–36.0)
MCV: 88 fL (ref 80.0–100.0)
Monocytes Absolute: 0.8 10*3/uL (ref 0.1–1.0)
Monocytes Relative: 11 %
Neutro Abs: 4.3 10*3/uL (ref 1.7–7.7)
Neutrophils Relative %: 59 %
Platelets: 213 10*3/uL (ref 150–400)
RBC: 4.75 MIL/uL (ref 3.87–5.11)
RDW: 15.7 % — ABNORMAL HIGH (ref 11.5–15.5)
WBC: 7.2 10*3/uL (ref 4.0–10.5)
nRBC: 0 % (ref 0.0–0.2)

## 2022-07-30 MED ORDER — HEPARIN SOD (PORK) LOCK FLUSH 100 UNIT/ML IV SOLN
500.0000 [IU] | Freq: Once | INTRAVENOUS | Status: AC | PRN
  Administered 2022-07-30: 500 [IU]
  Filled 2022-07-30: qty 5

## 2022-07-30 MED ORDER — SODIUM CHLORIDE 0.9% FLUSH
10.0000 mL | INTRAVENOUS | Status: DC | PRN
  Administered 2022-07-30: 10 mL
  Filled 2022-07-30: qty 10

## 2022-07-30 MED ORDER — SODIUM CHLORIDE 0.9 % IV SOLN
Freq: Once | INTRAVENOUS | Status: AC
  Filled 2022-07-30: qty 250

## 2022-07-30 MED ORDER — SODIUM CHLORIDE 0.9 % IV SOLN
200.0000 mg | Freq: Once | INTRAVENOUS | Status: AC
  Administered 2022-07-30: 200 mg via INTRAVENOUS
  Filled 2022-07-30: qty 8

## 2022-07-30 NOTE — Progress Notes (Signed)
Remote ICD transmission.   

## 2022-07-30 NOTE — Progress Notes (Signed)
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  No evidence of nodal involvement.  She was not deemed to be a candidate for surgery and underwent SBRT to this lesion.  This was biopsy-proven adenocarcinoma with acinar and solid morphologies.  She then had a CT scan in January 2020 which showed enlarging satellite right upper lobe lung nodules.  This was followed by a PET CT scan which showed right upper lobe lung nodules 2 of them were hypermetabolic with an SUV of 8.7 was also a thyroid nodule approximately 4 mm in size which was not definitely hypermetabolic.  Patient was seen by Dr. Donella Stade following this and sh received SBRT for 5 fractions encompassing all these 3 nodules.  Patient has not had any systemic chemotherapy for her lung cancer so far.  There is a gradual increase in the size of her right upper lobe lung nodules in May  2022 and patient received SBRT to this area again by Dr. Donella Stade   Scans up until May 2023 showed stable postradiation changes in the right upper lobe.  Mildly enlarged right paratracheal but stable as compared to 3 months prior.  No new findings of metastatic disease.  However CT chest in September 2023 showed considerable increase in the area ofRight upper lobe lung mass from pre-existing 5.5 cm which included area of radiation fibrosis to 6.4 cm.  Enlargement of the left upper lobe lung nodule from 0.4 to 0.6 cm.  New moderate right pleural effusion interval enlargement of the pretracheal lymph nodes to 1.2 cm   Patient underwent right-sided thoracentesis which was consistent with adenocarcinoma of lung origin  Interval history-patient is here with her grand daughter.   They state that she is doing well. Continues to have her baseline SOB which is stable. She was able to walk from her car all the way in without stopping which she reports is good for her. She had her first cycle of Keytruda on 07/02/2022 for which she reports she tolerated well. Mild nausea at home- had antiemetics which resolved her symptoms. Maybe slight decrease in appetite but otherwise did well. Ready for her treatment today. No rashes, GI symptoms, fevers.   ECOG PS- 1 Pain scale- 0  Wt Readings from Last 3 Encounters:  07/30/22 217 lb (98.4 kg)  07/12/22 220 lb 7 oz (100 kg)  07/07/22 220 lb (99.8 kg)    Review of systems- Review of Systems  Constitutional:  Positive for malaise/fatigue. Negative for chills, fever and weight loss.  HENT:  Negative for congestion,  ear discharge and nosebleeds.   Eyes:  Negative for blurred vision.  Respiratory:  Positive for shortness of breath. Negative for cough, hemoptysis, sputum production and wheezing.   Cardiovascular:  Negative for chest pain, palpitations, orthopnea and claudication.  Gastrointestinal:  Negative for abdominal pain, blood in stool, constipation, diarrhea,  heartburn, melena, nausea and vomiting.  Genitourinary:  Negative for dysuria, flank pain, frequency, hematuria and urgency.  Musculoskeletal:  Negative for back pain, joint pain and myalgias.  Skin:  Negative for rash.  Neurological:  Negative for dizziness, tingling, focal weakness, seizures, weakness and headaches.  Endo/Heme/Allergies:  Does not bruise/bleed easily.  Psychiatric/Behavioral:  Negative for depression and suicidal ideas. The patient does not have insomnia.       No Known Allergies   Past Medical History:  Diagnosis Date   AICD (automatic cardioverter/defibrillator) present    Anterior myocardial infarction ([place])    a. 10/2014 - occluded LAD, complicated by cardiogenic shock-->Med Rx as interventional team was unable to open LAD.   Arthritis    arthirits of knee on right   Cancer of upper lobe of right lung ([place]) 09/2017   radiation therapy right side   Cervical cancer (HCC)    in remission for ~20 yrs, s/p hysterectomy   Chronic combined systolic and diastolic CHF (congestive heart failure) ([place])    a. 10/2015 Echo: EF 20-25%, sev diast dysfxn, mildly reduced RV fxn. Nl MV prosthesis; b. 12/2017 Echo: EF 20-25%, diff HK. Sev ant/antsept HK, apical AK. Mild MS (mean graad 79mmHg). Mod TR. PASP 32mmHg.   Coronary artery disease    a. 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   Essential hypertension    Ischemic cardiomyopathy    a. 03/2015 s/p MDT single lead AICD;  b. 10/2015 Echo: EF 20-25%;  c. 12/2017 Echo: EF 20-25%, diff HK.   Mass of upper lobe of right lung    a. noted on CXR & CT 08/2017 w/ abnl PET CT.   Mitral valve disease    a. 2006 s/p MVR w/ 60mm SJM bileaflet mechanical valve-->chronic coumadin/ASA; b. 12/2017 Echo: Mild MS. Mean gradient 27mmHg.   Personal history of radiation therapy    f/u lung ca     Past Surgical History:  Procedure Laterality Date    APPENDECTOMY  1998   BREAST EXCISIONAL BIOPSY Left 80s   benign   CARDIAC CATHETERIZATION     CARDIAC DEFIBRILLATOR PLACEMENT     COLONOSCOPY WITH PROPOFOL N/A 01/11/2020   Procedure: COLONOSCOPY WITH PROPOFOL;  Surgeon: Jonathon Bellows, MD;  Location: [facility] ENDOSCOPY;  Service: Gastroenterology;  Laterality: N/A;   IR IMAGING GUIDED PORT INSERTION  06/29/2022   PORTA CATH INSERTION     RADICAL HYSTERECTOMY  1998   REPLACEMENT TOTAL KNEE Right 12/10/2021   SHOULDER SURGERY Bilateral    rotator cuff tears   VALVE REPLACEMENT     Mitral valve; 27 mm [employer] Jude bileaflet valve    Social History   Socioeconomic History   Marital status: Married    Spouse name: Tosin   Number of children: 2   Years of education: 14   Highest education level: Some college, no degree  Occupational History   Occupation: disable  Tobacco Use   Smoking status: Former    Packs/day: 1.00    Years: 4.00    Total pack years: 4.00    Types: Cigarettes    Quit date: 1998  Years since quitting: 25.9    Passive exposure: Past   Smokeless tobacco: Never  Vaping Use   Vaping Use: Never used  Substance and Sexual Activity   Alcohol use: No   Drug use: No   Sexual activity: Yes    Partners: Male    Birth control/protection: Surgical  Other Topics Concern   Not on file  Social History Narrative   Not on file   Social Determinants of Health   Financial Resource Strain: Low Risk  (10/29/2021)   Overall Financial Resource Strain (CARDIA)    Difficulty of Paying Living Expenses: Not hard at all  Food Insecurity: No Food Insecurity (10/29/2021)   Hunger Vital Sign    Worried About Running Out of Food in the Last Year: Never true    [place] in the Last Year: Never true  Transportation Needs: No Transportation Needs (10/29/2021)   PRAPARE - Hydrologist (Medical): No    Lack of Transportation (Non-Medical): No  Physical Activity: Insufficiently Active (10/29/2021)   Exercise  Vital Sign    Days of Exercise per Week: 2 days    Minutes of Exercise per Session: 20 min  Stress: No Stress Concern Present (10/29/2021)   [place]    Feeling of Stress : Not at all  Social Connections: Moderately Integrated (10/29/2021)   Social Connection and Isolation Panel [NHANES]    Frequency of Communication with Friends and Family: Three times a week    Frequency of Social Gatherings with Friends and Family: Once a week    Attends Religious Services: More than 4 times per year    Active Member of [employer] or Organizations: No    Attends Archivist Meetings: Never    Marital Status: Married  Human resources officer Violence: Not At Risk (10/29/2021)   Humiliation, Afraid, Rape, and Kick questionnaire    Fear of Current or Ex-Partner: No    Emotionally Abused: No    Physically Abused: No    Sexually Abused: No    Family History  Problem Relation Age of Onset   Heart attack Mother    Hypertension Mother    Diabetes Mother    Emphysema Father        smoker   Diabetes Maternal Grandmother    Kidney disease Maternal Grandmother    Breast cancer Maternal Aunt    Breast cancer Maternal Aunt    Colon cancer Maternal Uncle      Current Outpatient Medications:    aspirin 81 MG chewable tablet, Chew 81 mg by mouth daily., Disp: , Rfl:    atorvastatin (LIPITOR) 80 MG tablet, TAKE 1 TABLET BY MOUTH ONCE DAILY., Disp: 30 tablet, Rfl: 0   cyclobenzaprine (FLEXERIL) 5 MG tablet, Take 1 tablet (5 mg total) by mouth 3 (three) times daily as needed for muscle spasms., Disp: 30 tablet, Rfl: 1   ENTRESTO 24-26 MG, TAKE 1 TABLET BY MOUTH TWICE DAILY *SCHEDULE OFFICE VISIT FOR FURTHER REFILLS*, Disp: 180 tablet, Rfl: 1   ezetimibe (ZETIA) 10 MG tablet, Take 1 tablet (10 mg total) by mouth daily., Disp: 30 tablet, Rfl: 2   fluticasone (FLONASE) 50 MCG/ACT nasal spray, Place 2 sprays into both nostrils as needed for allergies or  rhinitis., Disp: , Rfl:    furosemide (LASIX) 40 MG tablet, Take 2 tablets (80 mg total) by mouth 2 (two) times daily., Disp: 120 tablet, Rfl: 5   gabapentin (NEURONTIN)  300 MG capsule, Take 1 capsule (300 mg total) by mouth 2 (two) times daily., Disp: 180 capsule, Rfl: 3   lidocaine-prilocaine (EMLA) cream, Apply to affected area once, Disp: 30 g, Rfl: 3   metolazone (ZAROXOLYN) 2.5 MG tablet, Take 1 tablet (2.5 mg) by mouth once a week. Take 30 minutes prior to your furosemide dose., Disp: 12 tablet, Rfl: 0   metoprolol succinate (TOPROL-XL) 25 MG 24 hr tablet, TAKE 1 TABLET BY MOUTH TWICE DAILY IN THE MORNING AND AT BEDTIME, Disp: 180 tablet, Rfl: 0   ondansetron (ZOFRAN) 8 MG tablet, Take 1 tablet (8 mg total) by mouth every 8 (eight) hours as needed for nausea or vomiting., Disp: 30 tablet, Rfl: 1   oxybutynin (DITROPAN-XL) 10 MG 24 hr tablet, Take 1 tablet (10 mg total) by mouth daily., Disp: 30 tablet, Rfl: 11   oxyCODONE (OXY IR/ROXICODONE) 5 MG immediate release tablet, Take 1 tablet (5 mg total) by mouth every 4 (four) hours as needed for severe pain., Disp: 30 tablet, Rfl: 0   potassium chloride SA (KLOR-CON M) 20 MEQ tablet, Take 2 tablets (40 mEq total) by mouth daily., Disp: 60 tablet, Rfl: 6   prochlorperazine (COMPAZINE) 10 MG tablet, Take 1 tablet (10 mg total) by mouth every 6 (six) hours as needed for nausea or vomiting., Disp: 30 tablet, Rfl: 1   spironolactone (ALDACTONE) 25 MG tablet, Take 1 tablet (25 mg total) by mouth daily., Disp: 90 tablet, Rfl: 3   traMADol (ULTRAM) 50 MG tablet, TAKE 1 TABLET BY MOUTH EVERY 6 HOURS AS NEEDED, Disp: 30 tablet, Rfl: 0   warfarin (COUMADIN) 5 MG tablet, 5 mg daily except 7.5 mg on Mon and Fri (Patient taking differently: Take 5 mg by mouth daily.), Disp: 90 tablet, Rfl: 1  Physical exam:  Vitals:   07/30/22 1423  BP: (!) 134/98  Pulse: 90  Temp: 97.8 F (36.6 C)  TempSrc: Tympanic  SpO2: 95%  Weight: 217 lb (98.4 kg)   Physical  Exam Constitutional:      General: She is not in acute distress. Cardiovascular:     Rate and Rhythm: Normal rate and regular rhythm.     Heart sounds: Normal heart sounds.  Pulmonary:     Effort: Pulmonary effort is normal.     Breath sounds: Normal breath sounds.  Abdominal:     General: Bowel sounds are normal.     Palpations: Abdomen is soft.  Skin:    General: Skin is warm and dry.  Neurological:     Mental Status: She is alert and oriented to person, place, and time.         Latest Ref Rng & Units 07/02/2022    8:00 AM  CMP  Glucose 70 - 99 mg/dL 172   BUN 8 - 23 mg/dL 16   Creatinine 0.44 - 1.00 mg/dL 1.06   Sodium 135 - 145 mmol/L 137   Potassium 3.5 - 5.1 mmol/L 3.5   Chloride 98 - 111 mmol/L 103   CO2 22 - 32 mmol/L 26   Calcium 8.9 - 10.3 mg/dL 9.0   Total Protein 6.5 - 8.1 g/dL 8.0   Total Bilirubin 0.3 - 1.2 mg/dL 1.8   Alkaline Phos 38 - 126 U/L 105   AST 15 - 41 U/L 26   ALT 0 - 44 U/L 15       Latest Ref Rng & Units 07/02/2022    8:00 AM  CBC  WBC 4.0 - 10.5 K/uL  7.0   Hemoglobin 12.0 - 15.0 g/dL 13.1   Hematocrit 36.0 - 46.0 % 39.6   Platelets 150 - 400 K/uL 201       US THORACENTESIS ASP PLEURAL SPACE W/IMG GUIDE  Result Date: 07/08/2022 INDICATION: Right pleural effusion EXAM: ULTRASOUND GUIDED RIGHT THORACENTESIS MEDICATIONS: 7 cc 1% lidocaine COMPLICATIONS: None immediate. PROCEDURE: An ultrasound guided thoracentesis was thoroughly discussed with the patient and questions answered. The benefits, risks, alternatives and complications were also discussed. The patient understands and wishes to proceed with the procedure. Written consent was obtained. Ultrasound was performed to localize and mark an adequate pocket of fluid in the right chest. The area was then prepped and draped in the normal sterile fashion. 1% Lidocaine was used for local anesthesia. Under ultrasound guidance a 6 Fr Safe-T-Centesis catheter was introduced. Thoracentesis was  performed. The catheter was removed and a dressing applied. FINDINGS: A total of approximately 1.0 L of amber fluid was removed. Ordering provider did not request laboratory samples IMPRESSION: Successful ultrasound guided right thoracentesis yielding 1.0 L of pleural fluid. Follow-up chest x-ray revealed no evidence of pneumothorax. Read by: Reatha Armour, PA-C Electronically Signed   By: Jerilynn Mages.  Shick M.D.   On: 07/08/2022 11:09   DG Chest 2 View  Result Date: 07/08/2022 CLINICAL DATA:  Shortness of breath, pleural effusion EXAM: CHEST - 2 VIEW COMPARISON:  05/26/2022 FINDINGS: There is marked increase in right pleural effusion. There is opacification of right mid and right lower lung fields. Evaluation of underlying lung for infiltrates is limited. Left lung is clear. There is homogeneous opacity, possibly neoplastic process in right upper lobe. There is prosthetic cardiac valve. Pacemaker/defibrillator battery is seen in the left infraclavicular region. Tip of right IJ chest port is seen in superior vena cava. IMPRESSION: There is significant interval increase in right pleural effusion. There is moderate to large right pleural effusion. Homogeneous opacity in right upper lung fields suggests possible neoplastic process. Left lung is clear. Electronically Signed   By: Elmer Picker M.D.   On: 07/08/2022 09:00   DG Chest Port 1 View  Result Date: 07/08/2022 CLINICAL DATA:  Status post right thoracentesis EXAM: PORTABLE CHEST 1 VIEW COMPARISON:  Previous studies including the examination of 07/07/2022 FINDINGS: There is significant interval decrease in moderate to large right pleural effusion. Small effusion is present in the right lower lung field. There is homogeneous density in right upper lung field, possibly neoplasm. Left lung is essentially clear. Left lateral CP angle is clear. There is no pneumothorax. Tip of right IJ central venous catheter is seen in superior vena cava. Pacemaker/defibrillator  battery is seen in left infraclavicular region. Prosthetic cardiac valve is seen. IMPRESSION: There is significant interval decrease in right pleural effusion after thoracentesis. There is small residual right pleural effusion. There is no pneumothorax. There is homogeneous opacity in right upper lung fields suggesting possible neoplastic process. Electronically Signed   By: Elmer Picker M.D.   On: 07/08/2022 08:58   CUP PACEART REMOTE DEVICE CHECK  Result Date: 07/07/2022 Scheduled remote reviewed. Normal device function.  Next remote 91 days. LA    Assessment and plan- Patient is a 61 y.o. 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  She is here for Cycle 2 [place].    Adenocarcinoma of right lung: She is doing well on her Keytruda treatments thus far. CBC reviewed and acceptable for treatment. CMP pending.  Disposition:  Keytruda Cycle 3 today as long as CMP is within treatment parameters.  RTC 3 weeks MD, labs (CBC w/, CMP) + Keytruda.     Cancer Staging  Primary lung adenocarcinoma, right [facility]) Staging form: Lung, AJCC 8th Edition - Clinical stage from 06/08/2022: Stage IVA (cTX, cN2, pM1a) - Signed by Sindy Guadeloupe, MD on 06/08/2022    Visit Diagnosis 1. Primary lung adenocarcinoma, right ([place])      Dr. Randa Evens, MD, MPH [facility] [phone] 07/30/2022 2:36 PM

## 2022-07-30 NOTE — Patient Instructions (Signed)
[facility]  Discharge Instructions: Thank you for choosing [facility] to provide your oncology and hematology care.  If you have a lab appointment with the [facility], please go directly to the [facility] and check in at the registration area.  Wear comfortable clothing and clothing appropriate for easy access to any Portacath or PICC line.   We strive to give you quality time with your provider. You may need to reschedule your appointment if you arrive late (15 or more minutes).  Arriving late affects you and other patients whose appointments are after yours.  Also, if you miss three or more appointments without notifying the office, you may be dismissed from the clinic at the provider's discretion.      For prescription refill requests, have your pharmacy contact our office and allow 72 hours for refills to be completed.    Today you received the following chemotherapy and/or immunotherapy agents [facility]    To help prevent nausea and vomiting after your treatment, we encourage you to take your nausea medication as directed.  BELOW ARE SYMPTOMS THAT SHOULD BE REPORTED IMMEDIATELY: *FEVER GREATER THAN 100.4 F (38 C) OR HIGHER *CHILLS OR SWEATING *NAUSEA AND VOMITING THAT IS NOT CONTROLLED WITH YOUR NAUSEA MEDICATION *UNUSUAL SHORTNESS OF BREATH *UNUSUAL BRUISING OR BLEEDING *URINARY PROBLEMS (pain or burning when urinating, or frequent urination) *BOWEL PROBLEMS (unusual diarrhea, constipation, pain near the anus) TENDERNESS IN MOUTH AND THROAT WITH OR WITHOUT PRESENCE OF ULCERS (sore throat, sores in mouth, or a toothache) UNUSUAL RASH, SWELLING OR PAIN  UNUSUAL VAGINAL DISCHARGE OR ITCHING   Items with * indicate a potential emergency and should be followed up as soon as possible or go to the Emergency Department if any problems should occur.  Please show the CHEMOTHERAPY ALERT CARD or IMMUNOTHERAPY ALERT CARD at check-in to the  Emergency Department and triage nurse.  Should you have questions after your visit or need to cancel or reschedule your appointment, please contact [facility] [place] [facility] [place]  [phone] and follow the prompts.  Office hours are 8:00 a.m. to 4:30 p.m. Monday - Friday. Please note that voicemails left after 4:00 p.m. may not be returned until the following business day.  We are closed weekends and major holidays. You have access to a nurse at all times for urgent questions. Please call the main number to the clinic [phone] and follow the prompts.  For any non-urgent questions, you may also contact your provider using MyChart. We now offer e-Visits for anyone 55 and older to request care online for non-urgent symptoms. For details visit [email or web address].   Also download the MyChart app! Go to the app store, search "MyChart", open the app, select [place], and log in with your MyChart username and password.  Masks are optional in the cancer centers. If you would like for your care team to wear a mask while they are taking care of you, please let them know. For doctor visits, patients may have with them one support person who is at least 62 years old. At this time, visitors are not allowed in the infusion area.

## 2022-07-31 ENCOUNTER — Other Ambulatory Visit: Payer: Self-pay | Admitting: Internal Medicine

## 2022-08-02 ENCOUNTER — Ambulatory Visit: Payer: Medicare HMO | Admitting: Urology

## 2022-08-03 ENCOUNTER — Encounter: Payer: Self-pay | Admitting: Family Medicine

## 2022-08-04 ENCOUNTER — Ambulatory Visit: Payer: Medicare HMO

## 2022-08-05 ENCOUNTER — Ambulatory Visit: Payer: Medicare HMO | Admitting: Internal Medicine

## 2022-08-09 ENCOUNTER — Telehealth: Payer: Self-pay | Admitting: *Deleted

## 2022-08-09 ENCOUNTER — Ambulatory Visit (INDEPENDENT_AMBULATORY_CARE_PROVIDER_SITE_OTHER): Payer: Medicare HMO | Admitting: Family Medicine

## 2022-08-09 ENCOUNTER — Encounter: Payer: Self-pay | Admitting: Family Medicine

## 2022-08-09 VITALS — BP 115/72 | HR 90 | Temp 98.2°F | Resp 16 | Ht 62.0 in | Wt 215.0 lb

## 2022-08-09 DIAGNOSIS — I10 Essential (primary) hypertension: Secondary | ICD-10-CM | POA: Diagnosis not present

## 2022-08-09 DIAGNOSIS — Z0000 Encounter for general adult medical examination without abnormal findings: Secondary | ICD-10-CM

## 2022-08-09 DIAGNOSIS — Z952 Presence of prosthetic heart valve: Secondary | ICD-10-CM | POA: Diagnosis not present

## 2022-08-09 DIAGNOSIS — R7303 Prediabetes: Secondary | ICD-10-CM | POA: Insufficient documentation

## 2022-08-09 DIAGNOSIS — Z23 Encounter for immunization: Secondary | ICD-10-CM

## 2022-08-09 DIAGNOSIS — I480 Paroxysmal atrial fibrillation: Secondary | ICD-10-CM

## 2022-08-09 DIAGNOSIS — E785 Hyperlipidemia, unspecified: Secondary | ICD-10-CM | POA: Diagnosis not present

## 2022-08-09 LAB — POCT INR
INR: 3.2 — AB (ref 2.0–3.0)
POC INR: 38.7

## 2022-08-09 NOTE — Telephone Encounter (Signed)
Received FMLA form via fax and completed form, sent for physician signature. I called and spoke with Laqueta Linden and she wants form emailed, I told her I cannot email it and she asked that I call Mr Esmeralda Links and ask for a fax number to send completed form to . She would also like a copy of form and will pick up at patient next appointment 08/20/22

## 2022-08-09 NOTE — Assessment & Plan Note (Signed)
Previously well controlled Continue statin and zetia Repeat FLP

## 2022-08-09 NOTE — Telephone Encounter (Signed)
Physician signed FMLA, I am awaiting a return call From Mr [facility] with a fax number

## 2022-08-09 NOTE — Assessment & Plan Note (Signed)
Recommend low carb diet °Recheck A1c  °

## 2022-08-09 NOTE — Assessment & Plan Note (Signed)
Well controlled Continue current medications Recheck metabolic panel F/u in 6 months  

## 2022-08-09 NOTE — Progress Notes (Signed)
I,Sulibeya S Dimas,acting as a Education administrator for Lavon Paganini, MD.,have documented all relevant documentation on the behalf of Lavon Paganini, MD,as directed by  Lavon Paganini, MD while in the presence of Lavon Paganini, MD.    Complete physical exam   Patient: Vanessa Carlson   DOB: 1961-05-27   61 y.o. Female  MRN: [id number] Visit Date: 08/09/2022  Today's healthcare provider: Lavon Paganini, MD   Chief Complaint  Patient presents with   Annual Exam   Subjective    Vanessa Carlson is a 61 y.o. female who presents today for a complete physical exam.  She reports consuming a general diet. The patient does not participate in regular exercise at present. She generally feels well. She reports sleeping well. She does not have additional problems to discuss today.  HPI  10/29/21 AWV  Advised on RSV vaccine and COVID booster She believes that she had a TDAP about 5-6 years ago   Past Medical History:  Diagnosis Date   AICD (automatic cardioverter/defibrillator) present    Anterior myocardial infarction ([place])    a. 10/2014 - occluded LAD, complicated by cardiogenic shock-->Med Rx as interventional team was unable to open LAD.   Arthritis    arthirits of knee on right   Cancer of upper lobe of right lung ([place]) 09/2017   radiation therapy right side   Cervical cancer (HCC)    in remission for ~20 yrs, s/p hysterectomy   Chronic combined systolic and diastolic CHF (congestive heart failure) ([place])    a. 10/2015 Echo: EF 20-25%, sev diast dysfxn, mildly reduced RV fxn. Nl MV prosthesis; b. 12/2017 Echo: EF 20-25%, diff HK. Sev ant/antsept HK, apical AK. Mild MS (mean graad 27mmHg). Mod TR. PASP 54mmHg.   Coronary artery disease    a. 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   Essential hypertension    Ischemic cardiomyopathy    a. 03/2015 s/p MDT single lead AICD;  b. 10/2015 Echo: EF  20-25%;  c. 12/2017 Echo: EF 20-25%, diff HK.   Mass of upper lobe of right lung    a. noted on CXR & CT 08/2017 w/ abnl PET CT.   Mitral valve disease    a. 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 12/2017 Echo: Mild MS. Mean gradient 42mmHg.   Personal history of radiation therapy    f/u lung ca   Past Surgical History:  Procedure Laterality Date   APPENDECTOMY  1998   BREAST EXCISIONAL BIOPSY Left 80s   benign   CARDIAC CATHETERIZATION     CARDIAC DEFIBRILLATOR PLACEMENT     COLONOSCOPY WITH PROPOFOL N/A 01/11/2020   Procedure: COLONOSCOPY WITH PROPOFOL;  Surgeon: Jonathon Bellows, MD;  Location: [facility] ENDOSCOPY;  Service: Gastroenterology;  Laterality: N/A;   IR IMAGING GUIDED PORT INSERTION  06/29/2022   PORTA CATH INSERTION     RADICAL HYSTERECTOMY  1998   REPLACEMENT TOTAL KNEE Right 12/10/2021   SHOULDER SURGERY Bilateral    rotator cuff tears   VALVE REPLACEMENT     Mitral valve; 27 mm St. Jude bileaflet valve   Social History   Socioeconomic History   Marital status: Married    Spouse name: Tosin   Number of children: 2   Years of education: 14   Highest education level: Some college, no degree  Occupational History   Occupation: disable  Tobacco Use   Smoking status: Former  Packs/day: 1.00    Years: 4.00    Total pack years: 4.00    Types: Cigarettes    Quit date: 29    Years since quitting: 25.9    Passive exposure: Past   Smokeless tobacco: Never  Vaping Use   Vaping Use: Never used  Substance and Sexual Activity   Alcohol use: No   Drug use: No   Sexual activity: Yes    Partners: Male    Birth control/protection: Surgical  Other Topics Concern   Not on file  Social History Narrative   Not on file   Social Determinants of Health   Financial Resource Strain: Low Risk  (10/29/2021)   Overall Financial Resource Strain (CARDIA)    Difficulty of Paying Living Expenses: Not hard at all  Food Insecurity: No Food  Insecurity (10/29/2021)   Hunger Vital Sign    Worried About Running Out of Food in the Last Year: Never true    [place] in the Last Year: Never true  Transportation Needs: No Transportation Needs (10/29/2021)   PRAPARE - Hydrologist (Medical): No    Lack of Transportation (Non-Medical): No  Physical Activity: Insufficiently Active (10/29/2021)   Exercise Vital Sign    Days of Exercise per Week: 2 days    Minutes of Exercise per Session: 20 min  Stress: No Stress Concern Present (10/29/2021)   [place]    Feeling of Stress : Not at all  Social Connections: Moderately Integrated (10/29/2021)   Social Connection and Isolation Panel [NHANES]    Frequency of Communication with Friends and Family: Three times a week    Frequency of Social Gatherings with Friends and Family: Once a week    Attends Religious Services: More than 4 times per year    Active Member of [employer] or Organizations: No    Attends Archivist Meetings: Never    Marital Status: Married  Human resources officer Violence: Not At Risk (10/29/2021)   Humiliation, Afraid, Rape, and Kick questionnaire    Fear of Current or Ex-Partner: No    Emotionally Abused: No    Physically Abused: No    Sexually Abused: No   Family Status  Relation Name Status   Mother  Deceased   Father  Deceased   MGM  (Not Specified)   Mat Aunt  (Not Specified)   Mat Aunt  (Not Specified)   Mat Uncle  (Not Specified)   Family History  Problem Relation Age of Onset   Heart attack Mother    Hypertension Mother    Diabetes Mother    Emphysema Father        smoker   Diabetes Maternal Grandmother    Kidney disease Maternal Grandmother    Breast cancer Maternal Aunt    Breast cancer Maternal Aunt    Colon cancer Maternal Uncle    No Known Allergies  Patient Care Team: Narya Beavin, Dionne Bucy, MD as PCP - General (Family Medicine) End, Harrell Gave,  MD as PCP - Cardiology (Cardiology) Sindy Guadeloupe, MD as Consulting Physician (Oncology) Sindy Guadeloupe, MD as Consulting Physician (Oncology) Noreene Filbert, MD as Consulting Physician (Radiation Oncology) Lovell Sheehan, MD as Consulting Physician (Orthopedic Surgery)   Medications: Outpatient Medications Prior to Visit  Medication Sig   aspirin 81 MG chewable tablet Chew 81 mg by mouth daily.   atorvastatin (LIPITOR) 80 MG tablet TAKE 1 TABLET BY MOUTH ONCE  DAILY.   cyclobenzaprine (FLEXERIL) 5 MG tablet Take 1 tablet (5 mg total) by mouth 3 (three) times daily as needed for muscle spasms.   ENTRESTO 24-26 MG TAKE 1 TABLET BY MOUTH TWICE DAILY *SCHEDULE OFFICE VISIT FOR FURTHER REFILLS*   ezetimibe (ZETIA) 10 MG tablet Take 1 tablet (10 mg total) by mouth daily.   furosemide (LASIX) 40 MG tablet Take 2 tablets (80 mg total) by mouth 2 (two) times daily.   gabapentin (NEURONTIN) 300 MG capsule Take 1 capsule (300 mg total) by mouth 2 (two) times daily.   lidocaine-prilocaine (EMLA) cream Apply to affected area once   metolazone (ZAROXOLYN) 2.5 MG tablet Take 1 tablet (2.5 mg) by mouth once a week. Take 30 minutes prior to your furosemide dose.   metoprolol succinate (TOPROL-XL) 25 MG 24 hr tablet TAKE 1 TABLET BY MOUTH TWICE DAILY IN THE MORNING AND AT BEDTIME   ondansetron (ZOFRAN) 8 MG tablet Take 1 tablet (8 mg total) by mouth every 8 (eight) hours as needed for nausea or vomiting.   oxybutynin (DITROPAN-XL) 10 MG 24 hr tablet Take 1 tablet (10 mg total) by mouth daily.   oxyCODONE (OXY IR/ROXICODONE) 5 MG immediate release tablet Take 1 tablet (5 mg total) by mouth every 4 (four) hours as needed for severe pain.   potassium chloride SA (KLOR-CON M) 20 MEQ tablet Take 2 tablets (40 mEq total) by mouth daily.   prochlorperazine (COMPAZINE) 10 MG tablet Take 1 tablet (10 mg total) by mouth every 6 (six) hours as needed for nausea or vomiting.   spironolactone (ALDACTONE) 25 MG tablet  Take 1 tablet (25 mg total) by mouth daily.   traMADol (ULTRAM) 50 MG tablet TAKE 1 TABLET BY MOUTH EVERY 6 HOURS AS NEEDED   warfarin (COUMADIN) 5 MG tablet 5 mg daily except 7.5 mg on Mon and Fri (Patient taking differently: Take 5 mg by mouth daily.)   [DISCONTINUED] fluticasone (FLONASE) 50 MCG/ACT nasal spray Place 2 sprays into both nostrils as needed for allergies or rhinitis.   No facility-administered medications prior to visit.    Review of Systems  Constitutional:  Positive for activity change and appetite change.  HENT:  Positive for voice change.   Eyes:  Positive for redness.  Respiratory:  Positive for shortness of breath and wheezing.   Genitourinary:  Positive for enuresis.  Musculoskeletal:  Positive for back pain.    Last CBC Lab Results  Component Value Date   WBC 7.2 07/30/2022   HGB 13.7 07/30/2022   HCT 41.8 07/30/2022   MCV 88.0 07/30/2022   MCH 28.8 07/30/2022   RDW 15.7 (H) 07/30/2022   PLT 213 16/05/9603   Last metabolic panel Lab Results  Component Value Date   GLUCOSE 164 (H) 07/30/2022   NA 136 07/30/2022   K 3.4 (L) 07/30/2022   CL 102 07/30/2022   CO2 26 07/30/2022   BUN 11 07/30/2022   CREATININE 0.94 07/30/2022   GFRNONAA >60 07/30/2022   CALCIUM 8.9 07/30/2022   PROT 8.0 07/30/2022   ALBUMIN 4.1 07/30/2022   LABGLOB 2.8 08/18/2018   AGRATIO 1.5 08/18/2018   BILITOT 1.8 (H) 07/30/2022   ALKPHOS 131 (H) 07/30/2022   AST 27 07/30/2022   ALT 16 07/30/2022   ANIONGAP 8 07/30/2022   Last lipids Lab Results  Component Value Date   CHOL 99 04/14/2021   HDL 34 (L) 04/14/2021   LDLCALC 52 04/14/2021   TRIG 67 04/14/2021   CHOLHDL 2.9 04/14/2021  Last hemoglobin A1c Lab Results  Component Value Date   HGBA1C 5.9 (H) 12/14/2019   Last thyroid functions Lab Results  Component Value Date   TSH 2.867 07/02/2022   T4TOTAL 8.6 07/02/2022   Last vitamin D No results found for: "25OHVITD2", "25OHVITD3", "VD25OH" Last vitamin B12  and Folate No results found for: "VITAMINB12", "FOLATE"    Objective    BP 115/72 (BP Location: Left Arm, Patient Position: Sitting, Cuff Size: Large)   Pulse 90   Temp 98.2 F (36.8 C) (Oral)   Resp 16   Ht 5\' 2"  (1.575 m)   Wt 215 lb (97.5 kg)   LMP  (LMP Unknown)   SpO2 90%   BMI 39.32 kg/m  BP Readings from Last 3 Encounters:  08/09/22 115/72  07/30/22 (!) 134/98  07/12/22 119/64   Wt Readings from Last 3 Encounters:  08/09/22 215 lb (97.5 kg)  07/30/22 217 lb (98.4 kg)  07/12/22 220 lb 7 oz (100 kg)      Physical Exam Vitals reviewed.  Constitutional:      General: She is not in acute distress.    Appearance: Normal appearance. She is well-developed. She is not diaphoretic.  HENT:     Head: Normocephalic and atraumatic.     Right Ear: Tympanic membrane, ear canal and external ear normal.     Left Ear: Tympanic membrane, ear canal and external ear normal.  Eyes:     General: No scleral icterus.    Conjunctiva/sclera: Conjunctivae normal.     Pupils: Pupils are equal, round, and reactive to light.  Neck:     Thyroid: No thyromegaly.  Cardiovascular:     Rate and Rhythm: Normal rate and regular rhythm.     Heart sounds: Murmur heard.  Pulmonary:     Effort: Pulmonary effort is normal. No respiratory distress.     Breath sounds: Normal breath sounds. No wheezing or rales.  Abdominal:     General: There is no distension.     Palpations: Abdomen is soft.     Tenderness: There is no abdominal tenderness.  Musculoskeletal:        General: No deformity.     Cervical back: Neck supple.     Right lower leg: No edema.     Left lower leg: No edema.  Lymphadenopathy:     Cervical: No cervical adenopathy.  Skin:    General: Skin is warm and dry.     Findings: No rash.  Neurological:     Mental Status: She is alert and oriented to person, place, and time. Mental status is at baseline.     Gait: Gait normal.  Psychiatric:        Mood and Affect: Mood normal.         Behavior: Behavior normal.        Thought Content: Thought content normal.    Keloid R knee (TKA scar)   Last depression screening scores    08/09/2022    1:39 PM 04/23/2022    9:07 AM 02/04/2022    1:57 PM  PHQ 2/9 Scores  PHQ - 2 Score 1 0 0  PHQ- 9 Score 3 4 1    Last fall risk screening    08/09/2022    1:38 PM  Fall Risk   Falls in the past year? 0  Number falls in past yr: 0  Injury with Fall? 0  Risk for fall due to : No Fall Risks  Follow up Falls evaluation completed  Last Audit-C alcohol use screening    08/09/2022    1:39 PM  Alcohol Use Disorder Test (AUDIT)  1. How often do you have a drink containing alcohol? 0  2. How many drinks containing alcohol do you have on a typical day when you are drinking? 0  3. How often do you have six or more drinks on one occasion? 0  AUDIT-C Score 0   A score of 3 or more in women, and 4 or more in men indicates increased risk for alcohol abuse, EXCEPT if all of the points are from question 1   Results for orders placed or performed in visit on 08/09/22  POCT INR  Result Value Ref Range   INR 3.2 (A) 2.0 - 3.0   POC INR 38.7     Assessment & Plan    Routine Health Maintenance and Physical Exam  Exercise Activities and Dietary recommendations  Goals      DIET - EAT MORE FRUITS AND VEGETABLES     Exercise 3x per week (30 min per time)     Recommend to exercise for 3 days a week for at least 30 minutes at a time.         Immunization History  Administered Date(s) Administered   Influenza Split 05/17/2016   Influenza,inj,Quad PF,6+ Mos 06/20/2015, 07/08/2017, 09/08/2018, 05/29/2021, 08/09/2022   PFIZER(Purple Top)SARS-COV-2 Vaccination 06/05/2020, 06/28/2020   Pneumococcal Polysaccharide-23 12/03/2008    Health Maintenance  Topic Date Due   DTaP/Tdap/Td (1 - Tdap) Never done   Zoster Vaccines- Shingrix (1 of 2) Never done   COVID-19 Vaccine (3 - Pfizer risk series) 07/26/2020   MAMMOGRAM  09/11/2021    Medicare Annual Wellness (AWV)  10/30/2022   PAP SMEAR-Modifier  08/04/2024   COLONOSCOPY (Pts 45-43yrs Insurance coverage will need to be confirmed)  01/10/2030   INFLUENZA VACCINE  Completed   Hepatitis C Screening  Completed   HIV Screening  Completed   HPV VACCINES  Aged Out    Discussed health benefits of physical activity, and encouraged her to engage in regular exercise appropriate for her age and condition.  Problem List Items Addressed This Visit       Cardiovascular and Mediastinum   HTN (hypertension) (Chronic)    Well controlled Continue current medications Recheck metabolic panel F/u in 6 months       Paroxysmal atrial fibrillation (HCC)     Other   Hyperlipidemia LDL goal <70    Previously well controlled Continue statin and zetia Repeat FLP      Relevant Orders   Lipid panel   Prediabetes    Recommend low carb diet Recheck A1c       Relevant Orders   Hemoglobin A1c   Other Visit Diagnoses     Encounter for annual health examination    -  Primary   Relevant Orders   Hemoglobin A1c   Lipid panel   Status post mitral valve replacement       Need for influenza vaccination       Relevant Orders   Flu Vaccine QUAD 95mo+IM (Fluarix, Fluzone & Alfiuria Quad PF) (Completed)        Return in about 6 months (around 02/08/2023) for chronic disease f/u.     I, Lavon Paganini, MD, have reviewed all documentation for this visit. The documentation on 08/09/22 for the exam, diagnosis, procedures, and orders are all accurate and complete.   Cornell Gaber, Dionne Bucy, MD, MPH [facility]

## 2022-08-09 NOTE — Patient Instructions (Addendum)
Description   Continue 5 mg daily and follow up 4 weeks.       Call [facility] to schedule a mammogram [phone]

## 2022-08-10 ENCOUNTER — Encounter: Payer: Self-pay | Admitting: Oncology

## 2022-08-10 NOTE — Telephone Encounter (Signed)
FMLA was faxed via the fax machine on lower level

## 2022-08-11 ENCOUNTER — Ambulatory Visit: Payer: Medicare HMO

## 2022-08-16 ENCOUNTER — Inpatient Hospital Stay: Payer: Medicare HMO

## 2022-08-16 ENCOUNTER — Other Ambulatory Visit: Payer: Self-pay

## 2022-08-16 ENCOUNTER — Encounter: Payer: Self-pay | Admitting: Oncology

## 2022-08-16 DIAGNOSIS — C3431 Malignant neoplasm of lower lobe, right bronchus or lung: Secondary | ICD-10-CM | POA: Diagnosis not present

## 2022-08-16 DIAGNOSIS — Z79899 Other long term (current) drug therapy: Secondary | ICD-10-CM | POA: Diagnosis not present

## 2022-08-16 DIAGNOSIS — R3989 Other symptoms and signs involving the genitourinary system: Secondary | ICD-10-CM

## 2022-08-16 DIAGNOSIS — Z5112 Encounter for antineoplastic immunotherapy: Secondary | ICD-10-CM | POA: Diagnosis not present

## 2022-08-16 DIAGNOSIS — Z87891 Personal history of nicotine dependence: Secondary | ICD-10-CM | POA: Diagnosis not present

## 2022-08-16 LAB — URINALYSIS, COMPLETE (UACMP) WITH MICROSCOPIC
Bilirubin Urine: NEGATIVE
Glucose, UA: NEGATIVE mg/dL
Ketones, ur: NEGATIVE mg/dL
Nitrite: NEGATIVE
Protein, ur: 30 mg/dL — AB
Specific Gravity, Urine: 1.011 (ref 1.005–1.030)
WBC, UA: >50 WBC/hpf — ABNORMAL HIGH (ref 0–5)
pH: 6 (ref 5.0–8.0)

## 2022-08-16 MED ORDER — AMOXICILLIN-POT CLAVULANATE 875-125 MG PO TABS: 1.0000 | ORAL_TABLET | Freq: Two times a day (BID) | ORAL | 0 refills | Status: DC

## 2022-08-16 NOTE — Telephone Encounter (Signed)
Have her come and drop off urine sample for UA and urine cx today

## 2022-08-18 LAB — URINE CULTURE: Culture: 60000 — AB

## 2022-08-20 ENCOUNTER — Inpatient Hospital Stay (HOSPITAL_BASED_OUTPATIENT_CLINIC_OR_DEPARTMENT_OTHER): Payer: Medicare HMO | Admitting: Oncology

## 2022-08-20 ENCOUNTER — Other Ambulatory Visit: Payer: Self-pay

## 2022-08-20 ENCOUNTER — Encounter: Payer: Self-pay | Admitting: Oncology

## 2022-08-20 ENCOUNTER — Inpatient Hospital Stay: Payer: Medicare HMO

## 2022-08-20 VITALS — BP 106/70 | HR 89 | Temp 97.9°F | Resp 16 | Ht 62.0 in | Wt 208.0 lb

## 2022-08-20 DIAGNOSIS — R7303 Prediabetes: Secondary | ICD-10-CM

## 2022-08-20 DIAGNOSIS — C3491 Malignant neoplasm of unspecified part of right bronchus or lung: Secondary | ICD-10-CM | POA: Diagnosis not present

## 2022-08-20 DIAGNOSIS — E785 Hyperlipidemia, unspecified: Secondary | ICD-10-CM

## 2022-08-20 DIAGNOSIS — Z79899 Other long term (current) drug therapy: Secondary | ICD-10-CM | POA: Diagnosis not present

## 2022-08-20 DIAGNOSIS — Z87891 Personal history of nicotine dependence: Secondary | ICD-10-CM | POA: Diagnosis not present

## 2022-08-20 DIAGNOSIS — C3431 Malignant neoplasm of lower lobe, right bronchus or lung: Secondary | ICD-10-CM | POA: Diagnosis not present

## 2022-08-20 DIAGNOSIS — Z5112 Encounter for antineoplastic immunotherapy: Secondary | ICD-10-CM

## 2022-08-20 LAB — CBC WITH DIFFERENTIAL/PLATELET
Abs Immature Granulocytes: 0.05 10*3/uL (ref 0.00–0.07)
Basophils Absolute: 0.1 10*3/uL (ref 0.0–0.1)
Basophils Relative: 1 %
Eosinophils Absolute: 0.5 10*3/uL (ref 0.0–0.5)
Eosinophils Relative: 5 %
HCT: 42.4 % (ref 36.0–46.0)
Hemoglobin: 14 g/dL (ref 12.0–15.0)
Immature Granulocytes: 1 %
Lymphocytes Relative: 12 %
Lymphs Abs: 1.1 10*3/uL (ref 0.7–4.0)
MCH: 29 pg (ref 26.0–34.0)
MCHC: 33 g/dL (ref 30.0–36.0)
MCV: 87.8 fL (ref 80.0–100.0)
Monocytes Absolute: 0.9 10*3/uL (ref 0.1–1.0)
Monocytes Relative: 10 %
Neutro Abs: 6.8 10*3/uL (ref 1.7–7.7)
Neutrophils Relative %: 71 %
Platelets: 226 10*3/uL (ref 150–400)
RBC: 4.83 MIL/uL (ref 3.87–5.11)
RDW: 16.7 % — ABNORMAL HIGH (ref 11.5–15.5)
WBC: 9.5 10*3/uL (ref 4.0–10.5)
nRBC: 0 % (ref 0.0–0.2)

## 2022-08-20 LAB — COMPREHENSIVE METABOLIC PANEL
ALT: 29 U/L (ref 0–44)
AST: 36 U/L (ref 15–41)
Albumin: 4.2 g/dL (ref 3.5–5.0)
Alkaline Phosphatase: 150 U/L — ABNORMAL HIGH (ref 38–126)
Anion gap: 11 (ref 5–15)
BUN: 13 mg/dL (ref 8–23)
CO2: 27 mmol/L (ref 22–32)
Calcium: 8.7 mg/dL — ABNORMAL LOW (ref 8.9–10.3)
Chloride: 99 mmol/L (ref 98–111)
Creatinine, Ser: 1.22 mg/dL — ABNORMAL HIGH (ref 0.44–1.00)
GFR, Estimated: 50 mL/min — ABNORMAL LOW (ref >60)
Glucose, Bld: 137 mg/dL — ABNORMAL HIGH (ref 70–99)
Potassium: 3 mmol/L — ABNORMAL LOW (ref 3.5–5.1)
Sodium: 137 mmol/L (ref 135–145)
Total Bilirubin: 2.1 mg/dL — ABNORMAL HIGH (ref 0.3–1.2)
Total Protein: 8 g/dL (ref 6.5–8.1)

## 2022-08-20 LAB — BILIRUBIN, FRACTIONATED(TOT/DIR/INDIR)
Bilirubin, Direct: 0.4 mg/dL — ABNORMAL HIGH (ref 0.0–0.2)
Indirect Bilirubin: 1.8 mg/dL — ABNORMAL HIGH (ref 0.3–0.9)
Total Bilirubin: 2.2 mg/dL — ABNORMAL HIGH (ref 0.3–1.2)

## 2022-08-20 LAB — LIPID PANEL
Cholesterol: 112 mg/dL (ref 0–200)
HDL: 23 mg/dL — ABNORMAL LOW (ref >40)
LDL Cholesterol: 69 mg/dL (ref 0–99)
Total CHOL/HDL Ratio: 4.9 RATIO
Triglycerides: 100 mg/dL (ref <150)
VLDL: 20 mg/dL (ref 0–40)

## 2022-08-20 MED ORDER — HEPARIN SOD (PORK) LOCK FLUSH 100 UNIT/ML IV SOLN
INTRAVENOUS | Status: AC
  Filled 2022-08-20: qty 5

## 2022-08-20 MED ORDER — SODIUM CHLORIDE 0.9 % IV SOLN
Freq: Once | INTRAVENOUS | Status: AC
  Filled 2022-08-20: qty 250

## 2022-08-20 MED ORDER — HEPARIN SOD (PORK) LOCK FLUSH 100 UNIT/ML IV SOLN
500.0000 [IU] | Freq: Once | INTRAVENOUS | Status: AC | PRN
  Administered 2022-08-20: 500 [IU]
  Filled 2022-08-20: qty 5

## 2022-08-20 MED ORDER — SODIUM CHLORIDE 0.9 % IV SOLN
200.0000 mg | Freq: Once | INTRAVENOUS | Status: AC
  Administered 2022-08-20: 200 mg via INTRAVENOUS
  Filled 2022-08-20: qty 8

## 2022-08-20 NOTE — Progress Notes (Signed)
Hematology/Oncology Consult note [facility]  [phone] [phone]  Patient Care Team: Virginia Crews, MD as PCP - General (Family Medicine) End, Harrell Gave, MD as PCP - Cardiology (Cardiology) Sindy Guadeloupe, MD as Consulting Physician (Oncology) Sindy Guadeloupe, MD as Consulting Physician (Oncology) Noreene Filbert, MD as Consulting Physician (Radiation Oncology) Lovell Sheehan, MD as Consulting Physician (Orthopedic Surgery)   Name of the patient: Vanessa Carlson  [id number]  Apr 22, 1961   Date of visit: 08/20/22  Diagnosis- history of 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  No evidence of nodal involvement.  She was not deemed to be a candidate for surgery and underwent SBRT to this lesion.  This was biopsy-proven adenocarcinoma with acinar and solid morphologies.  She then had a CT scan in January 2020 which showed enlarging satellite right upper lobe lung nodules.  This was followed by a PET CT scan which showed right upper lobe lung nodules 2 of them were hypermetabolic with an SUV of 8.7 was also a thyroid nodule approximately 4 mm in size which was not definitely hypermetabolic.  Patient was seen by Dr. Donella Stade following this and sh received SBRT for 5 fractions encompassing all these 3 nodules.  Patient has not had any systemic chemotherapy for her lung cancer so far.  There is a gradual increase in the size of her right upper lobe lung  nodules in May 2022 and patient received SBRT to this area again by Dr. Donella Stade   Scans up until May 2023 showed stable postradiation changes in the right upper lobe.  Mildly enlarged right paratracheal but stable as compared to 3 months prior.  No new findings of metastatic disease.  However CT chest in September 2023 showed considerable increase in the area ofRight upper lobe lung mass from pre-existing 5.5 cm which included area of radiation fibrosis to 6.4 cm.  Enlargement of the left upper lobe lung nodule from 0.4 to 0.6 cm.  New moderate right pleural effusion interval enlargement of the pretracheal lymph nodes to 1.2 cm   Patient underwent right-sided thoracentesis which was consistent with adenocarcinoma of lung origin  Interval history-patient reports some exertional shortness of breath and ongoing fatigue.  Denies any new complaints at this time  ECOG PS- 1 Pain scale- 0   Review of systems- Review of Systems  Constitutional:  Positive for malaise/fatigue. Negative for chills, fever and weight loss.  HENT:  Negative for congestion, ear discharge and nosebleeds.   Eyes:  Negative for blurred vision.  Respiratory:  Positive for shortness of breath. Negative for cough, hemoptysis, sputum production and wheezing.   Cardiovascular:  Negative for chest pain, palpitations, orthopnea and claudication.  Gastrointestinal:  Negative for abdominal pain, blood in stool, constipation, diarrhea, heartburn, melena, nausea and vomiting.  Genitourinary:  Negative for dysuria, flank pain, frequency, hematuria and urgency.  Musculoskeletal:  Negative for back pain, joint pain and myalgias.  Skin:  Negative for rash.  Neurological:  Negative for dizziness, tingling, focal weakness, seizures, weakness  and headaches.  Endo/Heme/Allergies:  Does not bruise/bleed easily.  Psychiatric/Behavioral:  Negative for depression and suicidal ideas. The patient does not have insomnia.       No Known  Allergies   Past Medical History:  Diagnosis Date   AICD (automatic cardioverter/defibrillator) present    Anterior myocardial infarction ([place])    a. 10/2014 - occluded LAD, complicated by cardiogenic shock-->Med Rx as interventional team was unable to open LAD.   Arthritis    arthirits of knee on right   Cancer of upper lobe of right lung ([place]) 09/2017   radiation therapy right side   Cervical cancer (HCC)    in remission for ~20 yrs, s/p hysterectomy   Chronic combined systolic and diastolic CHF (congestive heart failure) ([place])    a. 10/2015 Echo: EF 20-25%, sev diast dysfxn, mildly reduced RV fxn. Nl MV prosthesis; b. 12/2017 Echo: EF 20-25%, diff HK. Sev ant/antsept HK, apical AK. Mild MS (mean graad 70mmHg). Mod TR. PASP 66mmHg.   Coronary artery disease    a. 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   Essential hypertension    Ischemic cardiomyopathy    a. 03/2015 s/p MDT single lead AICD;  b. 10/2015 Echo: EF 20-25%;  c. 12/2017 Echo: EF 20-25%, diff HK.   Mass of upper lobe of right lung    a. noted on CXR & CT 08/2017 w/ abnl PET CT.   Mitral valve disease    a. 2006 s/p MVR w/ 83mm SJM bileaflet mechanical valve-->chronic coumadin/ASA; b. 12/2017 Echo: Mild MS. Mean gradient 81mmHg.   Personal history of radiation therapy    f/u lung ca     Past Surgical History:  Procedure Laterality Date   APPENDECTOMY  1998   BREAST EXCISIONAL BIOPSY Left 80s   benign   CARDIAC CATHETERIZATION     CARDIAC DEFIBRILLATOR PLACEMENT     COLONOSCOPY WITH PROPOFOL N/A 01/11/2020   Procedure: COLONOSCOPY WITH PROPOFOL;  Surgeon: Jonathon Bellows, MD;  [facility];  Service: Gastroenterology;  Laterality: N/A;   IR IMAGING GUIDED PORT INSERTION  06/29/2022   PORTA CATH INSERTION     RADICAL HYSTERECTOMY  1998   REPLACEMENT TOTAL KNEE Right 12/10/2021   SHOULDER SURGERY Bilateral    rotator cuff  tears   VALVE REPLACEMENT     Mitral valve; 27 mm St. Jude bileaflet valve    Social History   Socioeconomic History   Marital status: Married    Spouse name: Tosin   Number of children: 2   Years of education: 14   Highest education level: Some college, no degree  Occupational History   Occupation: disable  Tobacco Use   Smoking status: Former    Packs/day: 1.00    Years: 4.00    Total pack years: 4.00    Types: Cigarettes    Quit date: 1998    Years since quitting: 25.9    Passive exposure: Past   Smokeless tobacco: Never  Vaping Use   Vaping Use: Never used  Substance and Sexual Activity   Alcohol use: No   Drug use: No   Sexual activity: Yes    Partners: Male    Birth control/protection: Surgical  Other Topics Concern   Not on file  Social History Narrative   Not on file   Social Determinants of Health   Financial Resource Strain: Low Risk  (10/29/2021)   Overall Financial Resource Strain (CARDIA)  Difficulty of Paying Living Expenses: Not hard at all  Food Insecurity: No Food Insecurity (10/29/2021)   Hunger Vital Sign    Worried About Running Out of Food in the Last Year: Never true    Ran Out of Food in the Last Year: Never true  Transportation Needs: No Transportation Needs (10/29/2021)   PRAPARE - Hydrologist (Medical): No    Lack of Transportation (Non-Medical): No  Physical Activity: Insufficiently Active (10/29/2021)   Exercise Vital Sign    Days of Exercise per Week: 2 days    Minutes of Exercise per Session: 20 min  Stress: No Stress Concern Present (10/29/2021)   [place]    Feeling of Stress : Not at all  Social Connections: Moderately Integrated (10/29/2021)   Social Connection and Isolation Panel [NHANES]    Frequency of Communication with Friends and Family: Three times a week    Frequency of Social Gatherings with Friends and Family: Once a week     Attends Religious Services: More than 4 times per year    Active Member of [employer] or Organizations: No    Attends Archivist Meetings: Never    Marital Status: Married  Human resources officer Violence: Not At Risk (10/29/2021)   Humiliation, Afraid, Rape, and Kick questionnaire    Fear of Current or Ex-Partner: No    Emotionally Abused: No    Physically Abused: No    Sexually Abused: No    Family History  Problem Relation Age of Onset   Heart attack Mother    Hypertension Mother    Diabetes Mother    Emphysema Father        smoker   Diabetes Maternal Grandmother    Kidney disease Maternal Grandmother    Breast cancer Maternal Aunt    Breast cancer Maternal Aunt    Colon cancer Maternal Uncle      Current Outpatient Medications:    amoxicillin-clavulanate (AUGMENTIN) 875-125 MG tablet, Take 1 tablet by mouth 2 (two) times daily., Disp: 10 tablet, Rfl: 0   aspirin 81 MG chewable tablet, Chew 81 mg by mouth daily., Disp: , Rfl:    atorvastatin (LIPITOR) 80 MG tablet, TAKE 1 TABLET BY MOUTH ONCE DAILY., Disp: 30 tablet, Rfl: 0   cyclobenzaprine (FLEXERIL) 5 MG tablet, Take 1 tablet (5 mg total) by mouth 3 (three) times daily as needed for muscle spasms., Disp: 30 tablet, Rfl: 1   ENTRESTO 24-26 MG, TAKE 1 TABLET BY MOUTH TWICE DAILY *SCHEDULE OFFICE VISIT FOR FURTHER REFILLS*, Disp: 180 tablet, Rfl: 0   ezetimibe (ZETIA) 10 MG tablet, Take 1 tablet (10 mg total) by mouth daily., Disp: 30 tablet, Rfl: 2   furosemide (LASIX) 40 MG tablet, Take 2 tablets (80 mg total) by mouth 2 (two) times daily., Disp: 120 tablet, Rfl: 5   gabapentin (NEURONTIN) 300 MG capsule, Take 1 capsule (300 mg total) by mouth 2 (two) times daily., Disp: 180 capsule, Rfl: 3   lidocaine-prilocaine (EMLA) cream, Apply to affected area once, Disp: 30 g, Rfl: 3   metolazone (ZAROXOLYN) 2.5 MG tablet, Take 1 tablet (2.5 mg) by mouth once a week. Take 30 minutes prior to your furosemide dose., Disp: 12 tablet, Rfl:  0   metoprolol succinate (TOPROL-XL) 25 MG 24 hr tablet, TAKE 1 TABLET BY MOUTH TWICE DAILY IN THE MORNING AND AT BEDTIME, Disp: 180 tablet, Rfl: 0   ondansetron (ZOFRAN) 8 MG tablet, Take  1 tablet (8 mg total) by mouth every 8 (eight) hours as needed for nausea or vomiting., Disp: 30 tablet, Rfl: 1   oxybutynin (DITROPAN-XL) 10 MG 24 hr tablet, Take 1 tablet (10 mg total) by mouth daily., Disp: 30 tablet, Rfl: 11   oxyCODONE (OXY IR/ROXICODONE) 5 MG immediate release tablet, Take 1 tablet (5 mg total) by mouth every 4 (four) hours as needed for severe pain., Disp: 30 tablet, Rfl: 0   potassium chloride SA (KLOR-CON M) 20 MEQ tablet, Take 2 tablets (40 mEq total) by mouth daily., Disp: 60 tablet, Rfl: 6   prochlorperazine (COMPAZINE) 10 MG tablet, Take 1 tablet (10 mg total) by mouth every 6 (six) hours as needed for nausea or vomiting., Disp: 30 tablet, Rfl: 1   spironolactone (ALDACTONE) 25 MG tablet, Take 1 tablet (25 mg total) by mouth daily., Disp: 90 tablet, Rfl: 3   traMADol (ULTRAM) 50 MG tablet, TAKE 1 TABLET BY MOUTH EVERY 6 HOURS AS NEEDED, Disp: 30 tablet, Rfl: 0   warfarin (COUMADIN) 5 MG tablet, 5 mg daily except 7.5 mg on Mon and Fri (Patient taking differently: Take 5 mg by mouth daily.), Disp: 90 tablet, Rfl: 1 No current facility-administered medications for this visit.  Facility-Administered Medications Ordered in Other Visits:    heparin lock flush 100 unit/mL, 500 Units, Intracatheter, Once PRN, Sindy Guadeloupe, MD   pembrolizumab [facility]) 200 mg in sodium chloride 0.9 % 50 mL chemo infusion, 200 mg, Intravenous, Once, Sindy Guadeloupe, MD, Last Rate: 116 mL/hr at 08/20/22 1538, 200 mg at 08/20/22 1538  Physical exam:  Vitals:   08/20/22 1406  BP: 106/70  Pulse: 89  Resp: 16  Temp: 97.9 F (36.6 C)  TempSrc: Tympanic  SpO2: 92%  Weight: 208 lb (94.3 kg)  Height: 5\' 2"  (1.575 m)   Physical Exam Cardiovascular:     Rate and Rhythm: Normal rate and regular rhythm.      Heart sounds: Normal heart sounds.  Pulmonary:     Effort: Pulmonary effort is normal.     Comments: Breath sounds decreased over right lung base Skin:    General: Skin is warm and dry.  Neurological:     Mental Status: She is alert and oriented to person, place, and time.         Latest Ref Rng & Units 08/20/2022    1:48 PM  CMP  Glucose 70 - 99 mg/dL 137   BUN 8 - 23 mg/dL 13   Creatinine 0.44 - 1.00 mg/dL 1.22   Sodium 135 - 145 mmol/L 137   Potassium 3.5 - 5.1 mmol/L 3.0   Chloride 98 - 111 mmol/L 99   CO2 22 - 32 mmol/L 27   Calcium 8.9 - 10.3 mg/dL 8.7   Total Protein 6.5 - 8.1 g/dL 8.0   Total Bilirubin 0.3 - 1.2 mg/dL 2.1   Alkaline Phos 38 - 126 U/L 150   AST 15 - 41 U/L 36   ALT 0 - 44 U/L 29       Latest Ref Rng & Units 08/20/2022    1:48 PM  CBC  WBC 4.0 - 10.5 K/uL 9.5   Hemoglobin 12.0 - 15.0 g/dL 14.0   Hematocrit 36.0 - 46.0 % 42.4   Platelets 150 - 400 K/uL 226     Assessment and plan- Patient is a 61 y.o. female with history of stage I lung cancer in the past now with stage IV adenocarcinoma with malignant right pleural  effusion.  She is here for on treatment assessment prior to cycle 3 of palliative Keytruda  Patient noted to have a total bilirubin of 2.1.  Her last 2 bilirubins have been 1.8.  Her bilirubin was 1.8 even prior to her receiving Keytruda of the day she received her first dose.  In the past her bilirubin has been 1.8 transiently and then drifted down to normal limits.  I therefore do not think that her elevated bilirubin is secondary to [facility].  I plan on proceeding with Keytruda today.  I will check fractionated bilirubin today.  If however her bilirubin continues to rise at my next visit I will refer her to GI.  I will plan to repeat CT chest abdomen and pelvis with contrast in about 4 to 5 weeks from now.   Visit Diagnosis 1. Prediabetes   2. Hyperlipidemia, unspecified hyperlipidemia type   3. Primary lung adenocarcinoma, right  ([place])   4. Encounter for antineoplastic immunotherapy      Dr. Randa Evens, MD, MPH [facility] [phone] 08/20/2022 4:06 PM

## 2022-08-22 ENCOUNTER — Other Ambulatory Visit
Admission: RE | Admit: 2022-08-22 | Discharge: 2022-08-22 | Disposition: A | Discharge status: Home / Self Care | Payer: Medicare HMO | Source: Ambulatory Visit | Attending: Family Medicine | Admitting: Family Medicine

## 2022-08-22 DIAGNOSIS — C3490 Malignant neoplasm of unspecified part of unspecified bronchus or lung: Secondary | ICD-10-CM | POA: Diagnosis not present

## 2022-08-22 DIAGNOSIS — R7303 Prediabetes: Secondary | ICD-10-CM | POA: Diagnosis not present

## 2022-08-24 LAB — HEMOGLOBIN A1C
Hgb A1c MFr Bld: 6.2 % — ABNORMAL HIGH (ref 4.8–5.6)
Mean Plasma Glucose: 131 mg/dL

## 2022-08-24 NOTE — Progress Notes (Signed)
A1c has increased; now 6.2% Continue to recommend balanced, lower carb meals. Smaller meal size, adding snacks. Choosing water as drink of choice and increasing purposeful exercise.

## 2022-08-26 ENCOUNTER — Ambulatory Visit
Admission: RE | Admit: 2022-08-26 | Discharge: 2022-08-26 | Disposition: A | Discharge status: Home / Self Care | Payer: Medicare HMO | Source: Ambulatory Visit | Attending: Interventional Radiology | Admitting: Interventional Radiology

## 2022-08-26 ENCOUNTER — Other Ambulatory Visit: Payer: Self-pay | Admitting: Hospice and Palliative Medicine

## 2022-08-26 ENCOUNTER — Telehealth: Payer: Self-pay | Admitting: *Deleted

## 2022-08-26 ENCOUNTER — Encounter: Payer: Self-pay | Admitting: Oncology

## 2022-08-26 ENCOUNTER — Ambulatory Visit
Admission: RE | Admit: 2022-08-26 | Discharge: 2022-08-26 | Disposition: A | Discharge status: Home / Self Care | Payer: Medicare HMO | Source: Ambulatory Visit | Attending: Hospice and Palliative Medicine | Admitting: Hospice and Palliative Medicine

## 2022-08-26 DIAGNOSIS — J90 Pleural effusion, not elsewhere classified: Secondary | ICD-10-CM

## 2022-08-26 DIAGNOSIS — R0602 Shortness of breath: Secondary | ICD-10-CM | POA: Insufficient documentation

## 2022-08-26 DIAGNOSIS — C3490 Malignant neoplasm of unspecified part of unspecified bronchus or lung: Secondary | ICD-10-CM | POA: Diagnosis not present

## 2022-08-26 MED ORDER — LIDOCAINE HCL (PF) 1 % IJ SOLN
10.0000 mL | Freq: Once | INTRAMUSCULAR | Status: AC
  Administered 2022-08-26: 10 mL via INTRADERMAL

## 2022-08-26 NOTE — Telephone Encounter (Signed)
Judeen Hammans, RN contacted patient to have her come at 3pm in medical mall for procedures

## 2022-08-26 NOTE — Telephone Encounter (Signed)
U/s para and cxr orders entered. Waiting for test to be rescheduled.

## 2022-08-26 NOTE — Telephone Encounter (Signed)
Called pt after Marcie Bal set up pt for the thoracentesis for the patient today with arrival 3pm at medical mall and the drainage will start 3:30. Pt agreeable to the above

## 2022-08-26 NOTE — Telephone Encounter (Signed)
Patient called reporting that she is having worsening short of breath and is asking if she can have a CXR or fluid drawn off her lung. Please advise

## 2022-08-27 ENCOUNTER — Telehealth: Payer: Self-pay | Admitting: Oncology

## 2022-08-27 NOTE — Telephone Encounter (Signed)
Daughter Laqueta Linden called to report that patient woke up this morning and has severe pain " all over her back". S/p thoracentesis. No significant SOB, chest pain.  She has called ambulance and was told there is 18 hour wait in ED. Patient does not want to go to ER for evaluation.  Recommend her to take Oxycodone 5-10mg  Q4-6 hour as needed, she can also try Flexeril.  If no improvement, I recommend patient to go to ER for further evaluation.

## 2022-08-28 ENCOUNTER — Other Ambulatory Visit: Payer: Self-pay | Admitting: Family Medicine

## 2022-08-30 NOTE — Telephone Encounter (Signed)
Can you please f/u?

## 2022-08-31 ENCOUNTER — Inpatient Hospital Stay: Payer: Medicare HMO | Attending: Oncology | Admitting: Hospice and Palliative Medicine

## 2022-08-31 DIAGNOSIS — I110 Hypertensive heart disease with heart failure: Secondary | ICD-10-CM | POA: Insufficient documentation

## 2022-08-31 DIAGNOSIS — Z7982 Long term (current) use of aspirin: Secondary | ICD-10-CM | POA: Insufficient documentation

## 2022-08-31 DIAGNOSIS — Z7901 Long term (current) use of anticoagulants: Secondary | ICD-10-CM | POA: Insufficient documentation

## 2022-08-31 DIAGNOSIS — Z923 Personal history of irradiation: Secondary | ICD-10-CM | POA: Insufficient documentation

## 2022-08-31 DIAGNOSIS — C3411 Malignant neoplasm of upper lobe, right bronchus or lung: Secondary | ICD-10-CM | POA: Diagnosis not present

## 2022-08-31 DIAGNOSIS — Z90710 Acquired absence of both cervix and uterus: Secondary | ICD-10-CM | POA: Insufficient documentation

## 2022-08-31 DIAGNOSIS — Z79899 Other long term (current) drug therapy: Secondary | ICD-10-CM | POA: Insufficient documentation

## 2022-08-31 DIAGNOSIS — I2510 Atherosclerotic heart disease of native coronary artery without angina pectoris: Secondary | ICD-10-CM | POA: Insufficient documentation

## 2022-08-31 DIAGNOSIS — J910 Malignant pleural effusion: Secondary | ICD-10-CM | POA: Insufficient documentation

## 2022-08-31 DIAGNOSIS — Z87891 Personal history of nicotine dependence: Secondary | ICD-10-CM | POA: Insufficient documentation

## 2022-08-31 DIAGNOSIS — I252 Old myocardial infarction: Secondary | ICD-10-CM | POA: Insufficient documentation

## 2022-08-31 DIAGNOSIS — I5042 Chronic combined systolic (congestive) and diastolic (congestive) heart failure: Secondary | ICD-10-CM | POA: Insufficient documentation

## 2022-08-31 DIAGNOSIS — C3431 Malignant neoplasm of lower lobe, right bronchus or lung: Secondary | ICD-10-CM | POA: Insufficient documentation

## 2022-08-31 NOTE — Telephone Encounter (Signed)
Requested Prescriptions  Pending Prescriptions Disp Refills   atorvastatin (LIPITOR) 80 MG tablet [Pharmacy Med Name: Atorvastatin Calcium 80 MG Oral Tablet] 90 tablet 3    Sig: TAKE 1 TABLET BY MOUTH ONCE DAILY. OFFICE VISIT AND LABS NEEDED FOR FURTHER REFILLS.     Cardiovascular:  Antilipid - Statins Failed - 08/28/2022  4:31 PM      Failed - Lipid Panel in normal range within the last 12 months    Cholesterol, Total  Date Value Ref Range Status  12/14/2019 126 100 - 199 mg/dL Final   Cholesterol  Date Value Ref Range Status  08/20/2022 112 0 - 200 mg/dL Final   LDL Cholesterol (Calc)  Date Value Ref Range Status  07/08/2017 66 mg/dL (calc) Final    Comment:    Reference range: <100 . Desirable range <100 mg/dL for primary prevention;   <70 mg/dL for patients with CHD or diabetic patients  with > or = 2 CHD risk factors. [name] LDL-C is now calculated using the Martin-Hopkins  calculation, which is a validated novel method providing  better accuracy than the Friedewald equation in the  estimation of LDL-C.  Cresenciano Genre et al. Annamaria Helling. 5366;440(34): 2061-2068  (http://education.[employer].com/faq/FAQ164)    LDL Chol Calc (NIH)  Date Value Ref Range Status  12/14/2019 61 0 - 99 mg/dL Final   LDL Cholesterol  Date Value Ref Range Status  08/20/2022 69 0 - 99 mg/dL Final    Comment:           Total Cholesterol/HDL:CHD Risk Coronary Heart Disease Risk Table                     Men   Women  1/2 Average Risk   3.4   3.3  Average Risk       5.0   4.4  2 X Average Risk   9.6   7.1  3 X Average Risk  23.4   11.0        Use the calculated Patient Ratio above and the CHD Risk Table to determine the patient's CHD Risk.        ATP III CLASSIFICATION (LDL):  <100     mg/dL   Optimal  100-129  mg/dL   Near or Above                    Optimal  130-159  mg/dL   Borderline  160-189  mg/dL   High  >190     mg/dL   Very High Performed at [facility], [place], [place] [address]    HDL  Date Value Ref Range Status  08/20/2022 23 (L) >40 mg/dL Final  12/14/2019 48 >39 mg/dL Final   Triglycerides  Date Value Ref Range Status  08/20/2022 100 <150 mg/dL Final         Passed - Patient is not pregnant      Passed - Valid encounter within last 12 months    Recent Outpatient Visits           3 weeks ago Encounter for annual health examination   [facility] [place], Dionne Bucy, MD   4 months ago Skin lesion   [facility] Mikey Kirschner, PA-C   6 months ago Status post mitral valve replacement   [facility] Virginia Crews, MD   7 months ago Sexually transmitted disease exposure   [facility] Mikey Kirschner, [place]  9 months ago H/O mitral valve replacement with mechanical valve   [facility] [place], Dionne Bucy, MD       Future Appointments             In 1 month End, Harrell Gave, MD [place]. [place]   In 5 months Bacigalupo, Dionne Bucy, MD [facility], [place]

## 2022-08-31 NOTE — Progress Notes (Signed)
Virtual Visit via Video Note  I connected with Vanessa Carlson on 08/31/22 at  3:00 PM EST by a video enabled telemedicine application and verified that I am speaking with the correct person using two identifiers.  Location: Patient: Home Provider: Clinic   I discussed the limitations of evaluation and management by telemedicine and the availability of in person appointments. The patient expressed understanding and agreed to proceed.  History of Present Illness: Vanessa Carlson is a 62 year old woman with multiple medical problems including CAD, ischemic cardiomyopathy status post AICD, rheumatic heart disease status post mechanical valve on Coumadin, and initially stage I adenocarcinoma of the lung, now with stage IV disease with malignant pleural effusion.    Observations/Objective: Patient is currently on palliative Keytruda.  She had repeat thoracentesis on 08/26/2022 with 1.4 L pleural fluid removed.  Visit today to discuss pain management.  Patient reports mid to low back pain chronically managed with tramadol.  Patient also has prescription for oxycodone and has been taking both with varied success.  Patient says that she often will take a tramadol in the morning and then oxycodone once or twice during the day and at bedtime.  She has occasional drowsiness from the oxycodone but says that it helps her sleep at night.  Patient denies any adverse effects from pain medications.  She is having regular bowel movements.  I explained that both oxycodone and tramadol are short acting pain medications and recommended discontinuing the tramadol and focusing on taking oxycodone more regularly as needed.  We also discussed possible future addition of a long-acting opioid if needed.  Assessment and Plan: Pain -discontinue tramadol.  Recommended more regular utilization of oxycodone as needed for pain.  Can consider starting her on a long-acting opioid if needed.  PDMP reviewed.  Follow Up  Instructions: Follow-up as previously scheduled with Dr. Janese Banks next week   I discussed the assessment and treatment plan with the patient. The patient was provided an opportunity to ask questions and all were answered. The patient agreed with the plan and demonstrated an understanding of the instructions.   The patient was advised to call back or seek an in-person evaluation if the symptoms worsen or if the condition fails to improve as anticipated.  I provided 15 minutes of non-face-to-face time during this encounter.   Irean Hong, NP

## 2022-08-31 NOTE — Telephone Encounter (Signed)
RN Left vm for patient's daughter and the patient to return my phone call to f/u on her symptoms

## 2022-09-03 ENCOUNTER — Encounter: Payer: Self-pay | Admitting: Oncology

## 2022-09-06 ENCOUNTER — Ambulatory Visit (INDEPENDENT_AMBULATORY_CARE_PROVIDER_SITE_OTHER): Payer: Medicare HMO

## 2022-09-06 DIAGNOSIS — Z95810 Presence of automatic (implantable) cardiac defibrillator: Secondary | ICD-10-CM

## 2022-09-06 DIAGNOSIS — I5022 Chronic systolic (congestive) heart failure: Secondary | ICD-10-CM | POA: Diagnosis not present

## 2022-09-08 ENCOUNTER — Telehealth: Payer: Self-pay

## 2022-09-08 NOTE — Telephone Encounter (Signed)
LMOVM for patient to send missed ICM Transmission.

## 2022-09-09 ENCOUNTER — Encounter: Payer: Self-pay | Admitting: Oncology

## 2022-09-09 ENCOUNTER — Other Ambulatory Visit: Payer: Self-pay | Admitting: Nurse Practitioner

## 2022-09-09 ENCOUNTER — Telehealth: Payer: Self-pay | Admitting: *Deleted

## 2022-09-09 DIAGNOSIS — R042 Hemoptysis: Secondary | ICD-10-CM

## 2022-09-09 DIAGNOSIS — Z7901 Long term (current) use of anticoagulants: Secondary | ICD-10-CM

## 2022-09-09 NOTE — Telephone Encounter (Signed)
Attempted to reach patient to discuss her concerns sent my chart. Left vm for patient to return my phone call  On mychart - Pt reported that she has been spitting up blood x 2 days. She also was requesting RF on oxycodone.

## 2022-09-10 ENCOUNTER — Telehealth: Payer: Self-pay | Admitting: *Deleted

## 2022-09-10 ENCOUNTER — Inpatient Hospital Stay (HOSPITAL_BASED_OUTPATIENT_CLINIC_OR_DEPARTMENT_OTHER): Payer: Medicare HMO | Admitting: Oncology

## 2022-09-10 ENCOUNTER — Inpatient Hospital Stay: Payer: Medicare HMO

## 2022-09-10 ENCOUNTER — Encounter: Payer: Self-pay | Admitting: Oncology

## 2022-09-10 ENCOUNTER — Other Ambulatory Visit: Payer: Self-pay

## 2022-09-10 ENCOUNTER — Emergency Department: Payer: Medicare HMO

## 2022-09-10 ENCOUNTER — Encounter: Payer: Self-pay | Admitting: Nurse Practitioner

## 2022-09-10 ENCOUNTER — Inpatient Hospital Stay
Admission: EM | Admit: 2022-09-10 | Discharge: 2022-09-16 | DRG: 813 | Disposition: A | Discharge status: Home / Self Care | Payer: Medicare HMO | Attending: Internal Medicine | Admitting: Internal Medicine

## 2022-09-10 VITALS — BP 102/64 | HR 102 | Temp 96.6°F | Resp 17 | Wt 199.0 lb

## 2022-09-10 DIAGNOSIS — Z7901 Long term (current) use of anticoagulants: Secondary | ICD-10-CM

## 2022-09-10 DIAGNOSIS — Z833 Family history of diabetes mellitus: Secondary | ICD-10-CM

## 2022-09-10 DIAGNOSIS — E876 Hypokalemia: Secondary | ICD-10-CM | POA: Diagnosis present

## 2022-09-10 DIAGNOSIS — N179 Acute kidney failure, unspecified: Secondary | ICD-10-CM | POA: Insufficient documentation

## 2022-09-10 DIAGNOSIS — T45515A Adverse effect of anticoagulants, initial encounter: Secondary | ICD-10-CM | POA: Diagnosis present

## 2022-09-10 DIAGNOSIS — I959 Hypotension, unspecified: Secondary | ICD-10-CM | POA: Diagnosis not present

## 2022-09-10 DIAGNOSIS — I420 Dilated cardiomyopathy: Secondary | ICD-10-CM | POA: Diagnosis not present

## 2022-09-10 DIAGNOSIS — C3411 Malignant neoplasm of upper lobe, right bronchus or lung: Secondary | ICD-10-CM | POA: Diagnosis present

## 2022-09-10 DIAGNOSIS — I25118 Atherosclerotic heart disease of native coronary artery with other forms of angina pectoris: Secondary | ICD-10-CM | POA: Diagnosis not present

## 2022-09-10 DIAGNOSIS — I5042 Chronic combined systolic (congestive) and diastolic (congestive) heart failure: Secondary | ICD-10-CM | POA: Diagnosis present

## 2022-09-10 DIAGNOSIS — R7989 Other specified abnormal findings of blood chemistry: Secondary | ICD-10-CM | POA: Diagnosis not present

## 2022-09-10 DIAGNOSIS — Z6837 Body mass index (BMI) 37.0-37.9, adult: Secondary | ICD-10-CM

## 2022-09-10 DIAGNOSIS — Z923 Personal history of irradiation: Secondary | ICD-10-CM | POA: Diagnosis not present

## 2022-09-10 DIAGNOSIS — Z952 Presence of prosthetic heart valve: Secondary | ICD-10-CM | POA: Diagnosis not present

## 2022-09-10 DIAGNOSIS — J90 Pleural effusion, not elsewhere classified: Secondary | ICD-10-CM | POA: Insufficient documentation

## 2022-09-10 DIAGNOSIS — C787 Secondary malignant neoplasm of liver and intrahepatic bile duct: Secondary | ICD-10-CM | POA: Diagnosis present

## 2022-09-10 DIAGNOSIS — N1831 Chronic kidney disease, stage 3a: Secondary | ICD-10-CM | POA: Diagnosis present

## 2022-09-10 DIAGNOSIS — E860 Dehydration: Secondary | ICD-10-CM | POA: Diagnosis present

## 2022-09-10 DIAGNOSIS — Z8673 Personal history of transient ischemic attack (TIA), and cerebral infarction without residual deficits: Secondary | ICD-10-CM

## 2022-09-10 DIAGNOSIS — M545 Low back pain, unspecified: Secondary | ICD-10-CM | POA: Diagnosis present

## 2022-09-10 DIAGNOSIS — I252 Old myocardial infarction: Secondary | ICD-10-CM

## 2022-09-10 DIAGNOSIS — D6832 Hemorrhagic disorder due to extrinsic circulating anticoagulants: Principal | ICD-10-CM | POA: Diagnosis present

## 2022-09-10 DIAGNOSIS — I2510 Atherosclerotic heart disease of native coronary artery without angina pectoris: Secondary | ICD-10-CM | POA: Diagnosis present

## 2022-09-10 DIAGNOSIS — Z8541 Personal history of malignant neoplasm of cervix uteri: Secondary | ICD-10-CM

## 2022-09-10 DIAGNOSIS — N170 Acute kidney failure with tubular necrosis: Secondary | ICD-10-CM | POA: Diagnosis present

## 2022-09-10 DIAGNOSIS — Z79899 Other long term (current) drug therapy: Secondary | ICD-10-CM

## 2022-09-10 DIAGNOSIS — E785 Hyperlipidemia, unspecified: Secondary | ICD-10-CM | POA: Diagnosis present

## 2022-09-10 DIAGNOSIS — Z87891 Personal history of nicotine dependence: Secondary | ICD-10-CM | POA: Diagnosis not present

## 2022-09-10 DIAGNOSIS — J9620 Acute and chronic respiratory failure, unspecified whether with hypoxia or hypercapnia: Secondary | ICD-10-CM | POA: Diagnosis not present

## 2022-09-10 DIAGNOSIS — C3491 Malignant neoplasm of unspecified part of right bronchus or lung: Secondary | ICD-10-CM

## 2022-09-10 DIAGNOSIS — Z7982 Long term (current) use of aspirin: Secondary | ICD-10-CM

## 2022-09-10 DIAGNOSIS — I480 Paroxysmal atrial fibrillation: Secondary | ICD-10-CM | POA: Diagnosis not present

## 2022-09-10 DIAGNOSIS — I110 Hypertensive heart disease with heart failure: Secondary | ICD-10-CM | POA: Diagnosis not present

## 2022-09-10 DIAGNOSIS — N189 Chronic kidney disease, unspecified: Secondary | ICD-10-CM | POA: Diagnosis not present

## 2022-09-10 DIAGNOSIS — R791 Abnormal coagulation profile: Secondary | ICD-10-CM

## 2022-09-10 DIAGNOSIS — J9811 Atelectasis: Secondary | ICD-10-CM | POA: Diagnosis not present

## 2022-09-10 DIAGNOSIS — I255 Ischemic cardiomyopathy: Secondary | ICD-10-CM | POA: Diagnosis not present

## 2022-09-10 DIAGNOSIS — J910 Malignant pleural effusion: Secondary | ICD-10-CM | POA: Diagnosis not present

## 2022-09-10 DIAGNOSIS — Z96651 Presence of right artificial knee joint: Secondary | ICD-10-CM | POA: Diagnosis present

## 2022-09-10 DIAGNOSIS — Z803 Family history of malignant neoplasm of breast: Secondary | ICD-10-CM

## 2022-09-10 DIAGNOSIS — S2241XA Multiple fractures of ribs, right side, initial encounter for closed fracture: Secondary | ICD-10-CM | POA: Diagnosis not present

## 2022-09-10 DIAGNOSIS — Z8249 Family history of ischemic heart disease and other diseases of the circulatory system: Secondary | ICD-10-CM

## 2022-09-10 DIAGNOSIS — D62 Acute posthemorrhagic anemia: Secondary | ICD-10-CM | POA: Diagnosis not present

## 2022-09-10 DIAGNOSIS — Z90710 Acquired absence of both cervix and uterus: Secondary | ICD-10-CM | POA: Diagnosis not present

## 2022-09-10 DIAGNOSIS — Z825 Family history of asthma and other chronic lower respiratory diseases: Secondary | ICD-10-CM

## 2022-09-10 DIAGNOSIS — Z95810 Presence of automatic (implantable) cardiac defibrillator: Secondary | ICD-10-CM

## 2022-09-10 DIAGNOSIS — Z79620 Long term (current) use of immunosuppressive biologic: Secondary | ICD-10-CM

## 2022-09-10 DIAGNOSIS — I5022 Chronic systolic (congestive) heart failure: Secondary | ICD-10-CM | POA: Diagnosis not present

## 2022-09-10 DIAGNOSIS — I130 Hypertensive heart and chronic kidney disease with heart failure and stage 1 through stage 4 chronic kidney disease, or unspecified chronic kidney disease: Secondary | ICD-10-CM | POA: Diagnosis present

## 2022-09-10 DIAGNOSIS — R042 Hemoptysis: Secondary | ICD-10-CM | POA: Diagnosis not present

## 2022-09-10 DIAGNOSIS — C3490 Malignant neoplasm of unspecified part of unspecified bronchus or lung: Secondary | ICD-10-CM | POA: Diagnosis not present

## 2022-09-10 DIAGNOSIS — G8929 Other chronic pain: Secondary | ICD-10-CM | POA: Diagnosis present

## 2022-09-10 DIAGNOSIS — C3431 Malignant neoplasm of lower lobe, right bronchus or lung: Secondary | ICD-10-CM | POA: Diagnosis not present

## 2022-09-10 DIAGNOSIS — I5021 Acute systolic (congestive) heart failure: Secondary | ICD-10-CM | POA: Diagnosis not present

## 2022-09-10 DIAGNOSIS — I10 Essential (primary) hypertension: Secondary | ICD-10-CM | POA: Diagnosis present

## 2022-09-10 DIAGNOSIS — I700 Atherosclerosis of aorta: Secondary | ICD-10-CM | POA: Diagnosis not present

## 2022-09-10 DIAGNOSIS — R590 Localized enlarged lymph nodes: Secondary | ICD-10-CM | POA: Diagnosis not present

## 2022-09-10 DIAGNOSIS — K828 Other specified diseases of gallbladder: Secondary | ICD-10-CM | POA: Diagnosis not present

## 2022-09-10 DIAGNOSIS — R17 Unspecified jaundice: Secondary | ICD-10-CM | POA: Insufficient documentation

## 2022-09-10 DIAGNOSIS — Z800 Family history of malignant neoplasm of digestive organs: Secondary | ICD-10-CM

## 2022-09-10 LAB — CBC
HCT: 44.1 % (ref 36.0–46.0)
Hemoglobin: 13.8 g/dL (ref 12.0–15.0)
MCH: 28.1 pg (ref 26.0–34.0)
MCHC: 31.3 g/dL (ref 30.0–36.0)
MCV: 89.8 fL (ref 80.0–100.0)
Platelets: 229 10*3/uL (ref 150–400)
RBC: 4.91 MIL/uL (ref 3.87–5.11)
RDW: 16.8 % — ABNORMAL HIGH (ref 11.5–15.5)
WBC: 10.2 10*3/uL (ref 4.0–10.5)
nRBC: 0 % (ref 0.0–0.2)

## 2022-09-10 LAB — COMPREHENSIVE METABOLIC PANEL
ALT: 17 U/L (ref 0–44)
ALT: 17 U/L (ref 0–44)
AST: 31 U/L (ref 15–41)
AST: 33 U/L (ref 15–41)
Albumin: 4 g/dL (ref 3.5–5.0)
Albumin: 4.2 g/dL (ref 3.5–5.0)
Alkaline Phosphatase: 193 U/L — ABNORMAL HIGH (ref 38–126)
Alkaline Phosphatase: 203 U/L — ABNORMAL HIGH (ref 38–126)
Anion gap: 13 (ref 5–15)
Anion gap: 12 (ref 5–15)
BUN: 20 mg/dL (ref 8–23)
BUN: 20 mg/dL (ref 8–23)
CO2: 27 mmol/L (ref 22–32)
CO2: 27 mmol/L (ref 22–32)
Calcium: 9.1 mg/dL (ref 8.9–10.3)
Calcium: 9.3 mg/dL (ref 8.9–10.3)
Chloride: 97 mmol/L — ABNORMAL LOW (ref 98–111)
Chloride: 98 mmol/L (ref 98–111)
Creatinine, Ser: 1.81 mg/dL — ABNORMAL HIGH (ref 0.44–1.00)
Creatinine, Ser: 1.97 mg/dL — ABNORMAL HIGH (ref 0.44–1.00)
GFR, Estimated: 31 mL/min — ABNORMAL LOW (ref >60)
GFR, Estimated: 28 mL/min — ABNORMAL LOW (ref >60)
Glucose, Bld: 132 mg/dL — ABNORMAL HIGH (ref 70–99)
Glucose, Bld: 119 mg/dL — ABNORMAL HIGH (ref 70–99)
Potassium: 3.4 mmol/L — ABNORMAL LOW (ref 3.5–5.1)
Potassium: 3.5 mmol/L (ref 3.5–5.1)
Sodium: 137 mmol/L (ref 135–145)
Sodium: 137 mmol/L (ref 135–145)
Total Bilirubin: 1.8 mg/dL — ABNORMAL HIGH (ref 0.3–1.2)
Total Bilirubin: 2.3 mg/dL — ABNORMAL HIGH (ref 0.3–1.2)
Total Protein: 8.1 g/dL (ref 6.5–8.1)
Total Protein: 8.5 g/dL — ABNORMAL HIGH (ref 6.5–8.1)

## 2022-09-10 LAB — PROTIME-INR
INR: 10.1 (ref 0.8–1.2)
INR: >10 (ref 0.8–1.2)
INR: 1.4 — ABNORMAL HIGH (ref 0.8–1.2)
Prothrombin Time: 79.8 seconds — ABNORMAL HIGH (ref 11.4–15.2)
Prothrombin Time: 84.1 seconds — ABNORMAL HIGH (ref 11.4–15.2)
Prothrombin Time: 17.1 seconds — ABNORMAL HIGH (ref 11.4–15.2)

## 2022-09-10 LAB — CBC WITH DIFFERENTIAL/PLATELET
Abs Immature Granulocytes: 0.19 10*3/uL — ABNORMAL HIGH (ref 0.00–0.07)
Basophils Absolute: 0.1 10*3/uL (ref 0.0–0.1)
Basophils Relative: 1 %
Eosinophils Absolute: 0.5 10*3/uL (ref 0.0–0.5)
Eosinophils Relative: 5 %
HCT: 43.1 % (ref 36.0–46.0)
Hemoglobin: 13.7 g/dL (ref 12.0–15.0)
Immature Granulocytes: 2 %
Lymphocytes Relative: 10 %
Lymphs Abs: 1.1 10*3/uL (ref 0.7–4.0)
MCH: 28.6 pg (ref 26.0–34.0)
MCHC: 31.8 g/dL (ref 30.0–36.0)
MCV: 90 fL (ref 80.0–100.0)
Monocytes Absolute: 1 10*3/uL (ref 0.1–1.0)
Monocytes Relative: 9 %
Neutro Abs: 7.8 10*3/uL — ABNORMAL HIGH (ref 1.7–7.7)
Neutrophils Relative %: 73 %
Platelets: 246 10*3/uL (ref 150–400)
RBC: 4.79 MIL/uL (ref 3.87–5.11)
RDW: 16.8 % — ABNORMAL HIGH (ref 11.5–15.5)
WBC: 10.6 10*3/uL — ABNORMAL HIGH (ref 4.0–10.5)
nRBC: 0 % (ref 0.0–0.2)

## 2022-09-10 LAB — HEMOGLOBIN AND HEMATOCRIT, BLOOD
HCT: 43.3 % (ref 36.0–46.0)
Hemoglobin: 13.4 g/dL (ref 12.0–15.0)

## 2022-09-10 LAB — MAGNESIUM: Magnesium: 2.5 mg/dL — ABNORMAL HIGH (ref 1.7–2.4)

## 2022-09-10 LAB — TYPE AND SCREEN
ABO/RH(D): O NEG
Antibody Screen: NEGATIVE

## 2022-09-10 LAB — APTT: aPTT: 177 seconds (ref 24–36)

## 2022-09-10 LAB — PHOSPHORUS: Phosphorus: 1.9 mg/dL — ABNORMAL LOW (ref 2.5–4.6)

## 2022-09-10 MED ORDER — ONDANSETRON HCL 4 MG/2ML IJ SOLN
4.0000 mg | Freq: Four times a day (QID) | INTRAMUSCULAR | Status: AC | PRN
  Administered 2022-09-12 – 2022-09-14 (×3): 4 mg via INTRAVENOUS
  Filled 2022-09-10 (×3): qty 2

## 2022-09-10 MED ORDER — HEPARIN SOD (PORK) LOCK FLUSH 100 UNIT/ML IV SOLN
500.0000 [IU] | Freq: Once | INTRAVENOUS | Status: AC | PRN
  Administered 2022-09-10: 500 [IU]
  Filled 2022-09-10: qty 5

## 2022-09-10 MED ORDER — SODIUM CHLORIDE 0.9 % IV BOLUS
500.0000 mL | Freq: Once | INTRAVENOUS | Status: AC
  Administered 2022-09-10: 500 mL via INTRAVENOUS

## 2022-09-10 MED ORDER — VITAMIN K1 10 MG/ML IJ SOLN
10.0000 mg | INTRAVENOUS | Status: AC
  Administered 2022-09-10: 10 mg via INTRAVENOUS
  Filled 2022-09-10: qty 1

## 2022-09-10 MED ORDER — ONDANSETRON HCL 4 MG PO TABS: 4.0000 mg | ORAL_TABLET | Freq: Four times a day (QID) | ORAL | Status: AC | PRN

## 2022-09-10 MED ORDER — PHYTONADIONE 5 MG PO TABS
2.5000 mg | ORAL_TABLET | Freq: Once | ORAL | Status: DC
  Filled 2022-09-10: qty 1

## 2022-09-10 MED ORDER — OXYCODONE HCL 5 MG PO TABS
5.0000 mg | ORAL_TABLET | ORAL | Status: DC | PRN
  Administered 2022-09-10 – 2022-09-14 (×5): 5 mg via ORAL
  Filled 2022-09-10 (×5): qty 1

## 2022-09-10 MED ORDER — ACETAMINOPHEN 325 MG PO TABS
650.0000 mg | ORAL_TABLET | Freq: Four times a day (QID) | ORAL | Status: AC | PRN
  Administered 2022-09-10: 650 mg via ORAL
  Filled 2022-09-10 (×3): qty 2

## 2022-09-10 MED ORDER — OXYCODONE HCL 5 MG PO TABS: 5.0000 mg | ORAL_TABLET | ORAL | 0 refills | Status: DC | PRN

## 2022-09-10 MED ORDER — PROTHROMBIN COMPLEX CONC HUMAN 500 UNITS IV KIT
2138.0000 [IU] | PACK | Status: AC
  Administered 2022-09-10: 2138 [IU] via INTRAVENOUS
  Filled 2022-09-10: qty 2138

## 2022-09-10 MED ORDER — MIDODRINE HCL 5 MG PO TABS
10.0000 mg | ORAL_TABLET | Freq: Three times a day (TID) | ORAL | Status: DC
  Administered 2022-09-10 – 2022-09-16 (×18): 10 mg via ORAL
  Filled 2022-09-10 (×18): qty 2

## 2022-09-10 MED ORDER — PHYTONADIONE 5 MG PO TABS
5.0000 mg | ORAL_TABLET | Freq: Once | ORAL | Status: DC
  Filled 2022-09-10: qty 1

## 2022-09-10 MED ORDER — ACETAMINOPHEN 650 MG RE SUPP: 650.0000 mg | Freq: Four times a day (QID) | RECTAL | Status: AC | PRN

## 2022-09-10 MED ORDER — SENNOSIDES-DOCUSATE SODIUM 8.6-50 MG PO TABS
1.0000 | ORAL_TABLET | Freq: Every evening | ORAL | Status: DC | PRN
  Administered 2022-09-11 – 2022-09-12 (×2): 1 via ORAL
  Filled 2022-09-10 (×3): qty 1

## 2022-09-10 NOTE — Assessment & Plan Note (Addendum)
-   Antihypertensive medication Entresto, metoprolol succinate, spironolactone were not resumed admission due to hypotension - Patient states that she did not take these medications on the day of admission

## 2022-09-10 NOTE — Assessment & Plan Note (Signed)
-  This complicates overall care and prognosis.  

## 2022-09-10 NOTE — Assessment & Plan Note (Signed)
-  Warfarin not resumed on admission

## 2022-09-10 NOTE — Progress Notes (Signed)
Received call of critical INR for patient. INR > 10. She had reported hemoptysis yesterday. I spoke to Dr Smith Robert who felt that patient had increased serum creatinine today as well. Was ill appearing in clinic during visit today. Dr Smith Robert recommends urgent ER evaluation, concern for bleeding as well as CT Chest abdomen pelvis without contrast.  I communicated this to patient's husband (patient was unavailable). I called ER triage as well. Will also update Dr Alena Bills who is covering MD for the weekend.

## 2022-09-10 NOTE — Telephone Encounter (Signed)
Lab called report P T 79.8, INR 10.1, PTT 171.1

## 2022-09-10 NOTE — Assessment & Plan Note (Addendum)
-   Status post sodium chloride 500 mL bolus per EDP - Ordered additional sodium chloride 500 mL bolus - PCCM has been consulted via secure chat today to Vanessa Carlson - Kcentra IV and vitamin K 10 mg and dextrose IV ordered stat; called pharmacy to confirm.  Restarted coumadin at lower dose.INR was therapeutic at the time of discharge.

## 2022-09-10 NOTE — Progress Notes (Signed)
Per MD no treatment today, port deacccessed. Patient discharged. No concerns voiced.

## 2022-09-10 NOTE — Progress Notes (Signed)
Patient here for oncology follow-up appointment, concerns of coughing up blood, no appetite, SOB and N/D

## 2022-09-10 NOTE — ED Notes (Signed)
Notified NP that pt BP and MAP remained low after pt received x2 500 mL NS boluses.  Pt is asymptomatic at this time.  NP assessed pt at bedside and ordered Midodrine.

## 2022-09-10 NOTE — Assessment & Plan Note (Signed)
-  Atorvastatin 80 mg daily resumed

## 2022-09-10 NOTE — ED Provider Notes (Addendum)
[facility] Provider Note    Event Date/Time   First MD Initiated Contact with Patient 09/10/22 1651     (approximate)   History   abnormal bloodwork   HPI  Vanessa Carlson is a 62 y.o. female   3 of lung cancer presents to the ER for evaluation of hemoptysis.  Found to have supratherapeutic today.  She is only chemotherapy.  States that she has started coughing up clots since yesterday total..  Denies any fevers or chills.      Physical Exam   Triage Vital Signs: ED Triage Vitals  Enc Vitals Group     BP 09/10/22 1627 118/72     Pulse Rate 09/10/22 1627 (!) 109     Resp 09/10/22 1627 17     Temp 09/10/22 1627 98.6 F (37 C)     Temp Source 09/10/22 1627 Oral     SpO2 09/10/22 1627 96 %     Weight --      Height --      Head Circumference --      Peak Flow --      Pain Score 09/10/22 1629 0     Pain Loc --      Pain Edu? --      Excl. in GC? --     Most recent vital signs: Vitals:   09/10/22 1627 09/10/22 1853  BP: 118/72 (!) 78/56  Pulse: (!) 109 (!) 107  Resp: 17 16  Temp: 98.6 F (37 C)   SpO2: 96% 92%     Constitutional: Alert  Eyes: Conjunctivae are normal.  Head: Atraumatic. Nose: No congestion/rhinnorhea. Mouth/Throat: Mucous membranes are moist.   Neck: Painless ROM.  Cardiovascular:   Good peripheral circulation. Respiratory: Normal respiratory effort.  No retractions.  Gastrointestinal: Soft and nontender.  Musculoskeletal:  no deformity Neurologic:  MAE spontaneously. No gross focal neurologic deficits are appreciated.  Skin:  Skin is warm, dry and intact. No rash noted. Psychiatric: Mood and affect are normal. Speech and behavior are normal.    ED Results / Procedures / Treatments   Labs (all labs ordered are listed, but only abnormal results are displayed) Labs Reviewed  CBC - Abnormal; Notable for the following components:      Result Value   RDW 16.8 (*)    All other components within normal  limits  COMPREHENSIVE METABOLIC PANEL - Abnormal; Notable for the following components:   Glucose, Bld 119 (*)    Creatinine, Ser 1.97 (*)    Total Protein 8.5 (*)    Alkaline Phosphatase 203 (*)    Total Bilirubin 2.3 (*)    GFR, Estimated 28 (*)    All other components within normal limits  PROTIME-INR - Abnormal; Notable for the following components:   Prothrombin Time 84.1 (*)    INR >10.0 (*)    All other components within normal limits  PHOSPHORUS  MAGNESIUM  BASIC METABOLIC PANEL  CBC  TYPE AND SCREEN     EKG     RADIOLOGY Please see ED Course for my review and interpretation.  I personally reviewed all radiographic images ordered to evaluate for the above acute complaints and reviewed radiology reports and findings.  These findings were personally discussed with the patient.  Please see medical record for radiology report.    PROCEDURES:  Critical Care performed: No  .Critical Care  Performed by: Willy Eddy, MD Authorized by: Willy Eddy, MD   Critical care provider statement:  Critical care time (minutes):  35   Critical care was necessary to treat or prevent imminent or life-threatening deterioration of the following conditions:  Circulatory failure   Critical care was time spent personally by me on the following activities:  Ordering and performing treatments and interventions, ordering and review of laboratory studies, ordering and review of radiographic studies, pulse oximetry, re-evaluation of patient's condition, review of old charts, obtaining history from patient or surrogate, examination of patient, evaluation of patient's response to treatment, discussions with primary provider, discussions with consultants and development of treatment plan with patient or surrogate    MEDICATIONS ORDERED IN ED: Medications  acetaminophen (TYLENOL) tablet 650 mg (has no administration in time range)    Or  acetaminophen (TYLENOL) suppository 650 mg  (has no administration in time range)  ondansetron (ZOFRAN) tablet 4 mg (has no administration in time range)    Or  ondansetron (ZOFRAN) injection 4 mg (has no administration in time range)  senna-docusate (Senokot-S) tablet 1 tablet (has no administration in time range)  prothrombin complex conc human (KCENTRA) IVPB 1,500 Units (has no administration in time range)  phytonadione (VITAMIN K) 10 mg in dextrose 5 % 50 mL IVPB (has no administration in time range)  sodium chloride 0.9 % bolus 500 mL (500 mLs Intravenous New Bag/Given 09/10/22 1856)     IMPRESSION / MDM / ASSESSMENT AND PLAN / ED COURSE  I reviewed the triage vital signs and the nursing notes.                              Differential diagnosis includes, but is not limited to, supratherapeutic INR, acute blood loss anemia, mass, pneumonia, bronchitis, CKD  Patient presenting to the ER for evaluation of symptoms as described above.  Based on symptoms, risk factors and considered above differential, this presenting complaint could reflect a potentially life-threatening illness therefore the patient will be placed on continuous pulse oximetry and telemetry for monitoring.  Laboratory evaluation will be sent to evaluate for the above complaints.  CT imaging ordered for ecology request.   Clinical Course as of 09/10/22 1907  Caleen Essex Sep 10, 2022  1841 Patient's INR is critically elevated.  CT imaging on my review and interpretation without evidence of Aurax or radiation.  Per radiology does show a progression of cancer.  Given her history of supratherapeutic INR vitamin K.  She is not having any signs of active bleeding and has a stable hemoglobin).  Will consult hospitalist for admission. [PR]  1906 After I already discussed the case in consultation with hospitalist for admission emergency department patient is complaining of right chest wall pain as well as dropped her blood pressure.  IV fluid bolus given.  Given her hypotension will  order Kcentra for full and emergent reversal. [PR]    Clinical Course User Index [PR] Willy Eddy, MD    Patient's presentation is most consistent with    FINAL CLINICAL IMPRESSION(S) / ED DIAGNOSES   Final diagnoses:  Supratherapeutic INR  Hemoptysis     Rx / DC Orders   ED Discharge Orders     None        Note:  This document was prepared using Dragon voice recognition software and may include unintentional dictation errors.       Willy Eddy, MD 09/10/22 Nicholos Johns

## 2022-09-10 NOTE — ED Notes (Signed)
Pt ambulated to bathroom to pee. Back in bed with sandwich tray. Daughter at bedside assisting

## 2022-09-10 NOTE — Assessment & Plan Note (Addendum)
-   Phytonadione 2.5 mg was discontinued and ordered 5 mg on admission, this was discontinued due to hypotension -  given Kcentra, vitamin K 10 mg on admission High inr reversed Hb of 11.1 at discharge did not require prbc transfusion.

## 2022-09-10 NOTE — H&P (Addendum)
History and Physical   LAWAN NANEZ AYT:[id number] DOB: 05-23-1961 DOA: 09/10/2022  PCP: Virginia Crews, MD  Outpatient Specialists: Dr. Janese Banks Patient coming from: home   I have personally briefly reviewed patient's old medical records in [place].  Chief Concern: Elevated INR  HPI: [name] is a 62 year old female with primary lung adenocarcinoma on the right side, hypertension, hyperlipidemia, paroxysmal atrial fibrillation, on chronic anticoagulation with warfarin, who presents emergency department from oncology office for chief concerns of elevated INR.  Initial vitals in the emergency department showed temperature of 98.6, respiration rate of 17, heart rate of 102, blood pressure 102/64, SpO2 99% on room air.  Serum sodium is 137, potassium 3.5, chloride 98, bicarb 27, BUN of 20, serum creatinine 1.97, GFR of 28, nonfasting blood glucose 119, WBC 10.2, hemoglobin 13.8, platelets of 229.  INR was greater than 10.  At the request of Dr. Janese Banks, CT chest abdomen and pelvis without contrast was ordered: Read as enlarging right upper lobe mass consistent with progressive lung cancer.  Large multiloculated right pleural effusion, increased in size.  Compressive atelectasis within the right middle and lower lobe.  New and enlarging left lung pulmonary nodules consistent with progressive metastatic disease.  Gallbladder sludge.  No evidence of cholelithiasis or cholecystitis.  Grossly stable borderline enlarged mediastinal adenopathy.  Indeterminate 8 mm hypodensity inferior right lobe liver, not well-seen on prior exam.  Metastatic disease cannot be excluded.  ED treatment: Vitamin K 2.5 P.o.  At bedside patient is able to tell me her name, her age, she knows she is in the hospital.  She reports she was sent here for abnormal labs.  She states that she felt otherwise her normal baseline self.  She denies chest pain, shortness of breath, dysuria, hematuria, syncope, loss  of conscious, diarrhea, blood in her stool.  Patient states she last had a bowel movement on 09/08/2022.  She endorses constipation.  ROS: Constitutional: no weight change, no fever ENT/Mouth: no sore throat, no rhinorrhea Eyes: no eye pain, no vision changes Cardiovascular: no chest pain, no dyspnea,  no edema, no palpitations Respiratory: no cough, no sputum, no wheezing Gastrointestinal: no nausea, no vomiting, no diarrhea, no constipation Genitourinary: no urinary incontinence, no dysuria, no hematuria Musculoskeletal: no arthralgias, no myalgias Skin: no skin lesions, no pruritus, Neuro: no weakness, no loss of consciousness, no syncope Psych: no anxiety, no depression, no decrease appetite Heme/Lymph: no bruising, no bleeding  ED Course: Discussed with emergency medicine provider, patient requiring hospitalization for chief concerns of supratherapeutic INR.  Assessment/Plan  Principal Problem:   Supratherapeutic INR Active Problems:   HTN (hypertension)   Coronary artery disease involving native coronary artery of native heart without angina pectoris   Paroxysmal atrial fibrillation (HCC)   Chronic anticoagulation   Morbid obesity (HCC)   Hyperlipidemia LDL goal <70   Primary lung adenocarcinoma, right (HCC)   Pleural effusion   Hypotension   AKI (acute kidney injury) ([place])   Elevated bilirubin   Assessment and Plan:  * Supratherapeutic INR - Phytonadione 2.5 mg was discontinued and ordered 5 mg on admission, this was discontinued due to hypotension - We will order Kcentra, vitamin K 10 mg IV - Recheck INR  - CBC in a.m.  AKI (acute kidney injury) ([place]) - With baseline CKD stage IIIa - Check BMP in the a.m.  Hypotension - Status post sodium chloride 500 mL bolus per EDP - Ordered additional sodium chloride 500 mL bolus - Check stat  hemoglobin/hematocrit - Discussed with cross coverage provider - PCCM has been consulted via secure chat today to [place] -  Kcentra IV and vitamin K 10 mg and dextrose IV ordered stat; called pharmacy to confirm.  Pharmacy states that it is being prepared and will walk down as soon as possible  Pleural effusion Multiloculated, increased in size from prior exam - AM team to consult pulmonology/IR as indicated - This was not done on admission due to elevated INR  Hyperlipidemia LDL goal <70 - Atorvastatin 80 mg daily resumed  Morbid obesity (HCC) - This complicates overall care and prognosis.   Paroxysmal atrial fibrillation (HCC) - Warfarin not resumed on admission  HTN (hypertension) - Antihypertensive medication Entresto, metoprolol succinate, spironolactone were not resumed admission due to hypotension - Patient states that she did not take these medications today   Chart reviewed.   DVT prophylaxis: ted hose Code Status: full code Diet: Heart healthy Family Communication: Daughter at bedside Disposition Plan: pending clinical course Consults called: PCCM Admission status: Stepdown, inpatient  Past Medical History:  Diagnosis Date   AICD (automatic cardioverter/defibrillator) present    Anterior myocardial infarction (HCC)    a. 10/2014 - occluded LAD, complicated by cardiogenic shock-->Med Rx as interventional team was unable to open LAD.   Arthritis    arthirits of knee on right   Cancer of upper lobe of right lung (HCC) 09/2017   radiation therapy right side   Cervical cancer (HCC)    in remission for ~20 yrs, s/p hysterectomy   Chronic combined systolic and diastolic CHF (congestive heart failure) (HCC)    a. 10/2015 Echo: EF 20-25%, sev diast dysfxn, mildly reduced RV fxn. Nl MV prosthesis; b. 12/2017 Echo: EF 20-25%, diff HK. Sev ant/antsept HK, apical AK. Mild MS (mean graad ). Mod TR. PASP .   Coronary artery disease    a. 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   Essential hypertension     Ischemic cardiomyopathy    a. 03/2015 s/p MDT single lead AICD;  b. 10/2015 Echo: EF 20-25%;  c. 12/2017 Echo: EF 20-25%, diff HK.   Mass of upper lobe of right lung    a. noted on CXR & CT 08/2017 w/ abnl PET CT.   Mitral valve disease    a. 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 12/2017 Echo: Mild MS. Mean gradient .   Personal history of radiation therapy    f/u lung ca   Past Surgical History:  Procedure Laterality Date   APPENDECTOMY  1998   BREAST EXCISIONAL BIOPSY Left 80s   benign   CARDIAC CATHETERIZATION     CARDIAC DEFIBRILLATOR PLACEMENT     COLONOSCOPY WITH PROPOFOL N/A 01/11/2020   Procedure: COLONOSCOPY WITH PROPOFOL;  Surgeon: Wyline Mood, MD;  Location: [facility] ENDOSCOPY;  Service: Gastroenterology;  Laterality: N/A;   IR IMAGING GUIDED PORT INSERTION  06/29/2022   PORTA CATH INSERTION     RADICAL HYSTERECTOMY  1998   REPLACEMENT TOTAL KNEE Right 12/10/2021   SHOULDER SURGERY Bilateral    rotator cuff tears   VALVE REPLACEMENT     Mitral valve; 27 mm [employer] bileaflet valve   Social History:  reports that she quit smoking about 26 years ago. Her smoking use included cigarettes. She has a 4.00 pack-year smoking history. She has been exposed to tobacco smoke. She has never used smokeless tobacco. She reports that  she does not drink alcohol and does not use drugs.  No Known Allergies Family History  Problem Relation Age of Onset   Heart attack Mother    Hypertension Mother    Diabetes Mother    Emphysema Father        smoker   Diabetes Maternal Grandmother    Kidney disease Maternal Grandmother    Breast cancer Maternal Aunt    Breast cancer Maternal Aunt    Colon cancer Maternal Uncle    Family history: Family history reviewed and not pertinent  Prior to Admission medications   Medication Sig Start Date End Date Taking? Authorizing Provider  amoxicillin-clavulanate (AUGMENTIN) 875-125 MG tablet Take 1 tablet by  mouth 2 (two) times daily. 08/16/22   Creig Hines, MD  aspirin 81 MG chewable tablet Chew 81 mg by mouth daily.    [provider]  atorvastatin (LIPITOR) 80 MG tablet TAKE 1 TABLET BY MOUTH ONCE DAILY. OFFICE VISIT AND LABS NEEDED FOR FURTHER REFILLS. 08/31/22   Erasmo Downer, MD  cyclobenzaprine (FLEXERIL) 5 MG tablet Take 1 tablet (5 mg total) by mouth 3 (three) times daily as needed for muscle spasms. 12/07/19   Burnette, Jennifer M, PA-C  ENTRESTO 24-26 MG TAKE 1 TABLET BY MOUTH TWICE DAILY *SCHEDULE OFFICE VISIT FOR FURTHER REFILLS* 08/02/22   End, Cristal Deer, MD  ezetimibe (ZETIA) 10 MG tablet Take 1 tablet (10 mg total) by mouth daily. 07/12/22   End, Cristal Deer, MD  furosemide (LASIX) 40 MG tablet Take 2 tablets (80 mg total) by mouth 2 (two) times daily. 01/21/22   End, Cristal Deer, MD  gabapentin (NEURONTIN) 300 MG capsule Take 1 capsule (300 mg total) by mouth 2 (two) times daily. 10/26/21   Erasmo Downer, MD  lidocaine-prilocaine (EMLA) cream Apply to affected area once 06/25/22   Creig Hines, MD  LORazepam (ATIVAN) 0.5 MG tablet Take by mouth daily. 06/25/22   [provider]  metolazone (ZAROXOLYN) 2.5 MG tablet Take 1 tablet (2.5 mg) by mouth once a week. Take 30 minutes prior to your furosemide dose. 05/20/22   End, Cristal Deer, MD  metoprolol succinate (TOPROL-XL) 25 MG 24 hr tablet TAKE 1 TABLET BY MOUTH TWICE DAILY IN THE MORNING AND AT BEDTIME 06/21/22   End, Cristal Deer, MD  ondansetron (ZOFRAN) 8 MG tablet Take 1 tablet (8 mg total) by mouth every 8 (eight) hours as needed for nausea or vomiting. 06/25/22   Creig Hines, MD  oxybutynin (DITROPAN-XL) 10 MG 24 hr tablet Take 1 tablet (10 mg total) by mouth daily. 06/21/22   MacDiarmid, Lorin Picket, MD  oxyCODONE (OXY IR/ROXICODONE) 5 MG immediate release tablet Take 1 tablet (5 mg total) by mouth every 4 (four) hours as needed for severe pain. 09/10/22   Creig Hines, MD  potassium chloride SA  (KLOR-CON M) 20 MEQ tablet Take 2 tablets (40 mEq total) by mouth daily. 05/20/22   End, Cristal Deer, MD  prochlorperazine (COMPAZINE) 10 MG tablet Take 1 tablet (10 mg total) by mouth every 6 (six) hours as needed for nausea or vomiting. 06/25/22   Creig Hines, MD  spironolactone (ALDACTONE) 25 MG tablet Take 1 tablet (25 mg total) by mouth daily. 04/27/22   Caro Laroche, DO  warfarin (COUMADIN) 5 MG tablet 5 mg daily except 7.5 mg on Mon and Fri Patient taking differently: Take 5 mg by mouth daily. 05/14/22   Alfredia Ferguson, PA-C   Physical Exam: Vitals:   09/10/22 1627 09/10/22 1853  BP: 118/72 (!) 78/56  Pulse: (!) 109 (!) 107  Resp: 17 16  Temp: 98.6 F (37 C)   TempSrc: Oral   SpO2: 96% 92%   Constitutional: appears age appropriate, NAD, calm, comfortable Eyes: PERRL, lids and conjunctivae normal ENMT: Mucous membranes are moist. Posterior pharynx clear of any exudate or lesions. Age-appropriate dentition. Hearing appropriate Neck: normal, supple, no masses, no thyromegaly Respiratory: clear to auscultation bilaterally, no wheezing, no crackles. Normal respiratory effort. No accessory muscle use.  Cardiovascular: Regular rate and rhythm, no murmurs / rubs / gallops. No extremity edema. 2+ pedal pulses. No carotid bruits.  Abdomen: no tenderness, no masses palpated, no hepatosplenomegaly. Bowel sounds positive.  Musculoskeletal: no clubbing / cyanosis. No joint deformity upper and lower extremities. Good ROM, no contractures, no atrophy. Normal muscle tone.  Skin: no rashes, lesions, ulcers. No induration Neurologic: Sensation intact. Strength 5/5 in all 4.  Psychiatric: Normal judgment and insight. Alert and oriented x 3. Normal mood.   EKG: ordered  Imaging on Admission: I personally reviewed and I agree with radiologist reading as below.  CT CHEST ABDOMEN PELVIS WO CONTRAST  Result Date: 09/10/2022 CLINICAL DATA:  Non-small cell lung cancer, elevated INR EXAM: CT  CHEST, ABDOMEN AND PELVIS WITHOUT CONTRAST TECHNIQUE: Multidetector CT imaging of the chest, abdomen and pelvis was performed following the standard protocol without IV contrast. Examination was performed without intravenous contrast at the request of the ordering physician. Evaluation of the soft tissues, vascular structures, and solid viscera is limited. RADIATION DOSE REDUCTION: This exam was performed according to the departmental dose-optimization program which includes automated exposure control, adjustment of the mA and/or kV according to patient size and/or use of iterative reconstruction technique. COMPARISON:  05/21/2022, 06/10/2022 FINDINGS: CT CHEST FINDINGS Cardiovascular: Stable cardiac pacer. Mitral valve prosthesis. No pericardial effusion. Normal caliber of the thoracic aorta. Stable atherosclerosis of the aorta and coronary vasculature. Mediastinum/Nodes: Thyroid, trachea, and esophagus are grossly unremarkable. 9 mm short axis pretracheal lymph node reference image 17/2, previously measuring approximately 12 mm. Evaluation of the mediastinum and hilar regions is limited due to the lack of intravenous contrast. Lungs/Pleura: Right upper lobe mass measures 6.9 x 5.0 cm reference image 15/2, previously measuring 6.4 x 4.6 cm, compatible with recurrent right upper lobe lung cancer. There is a large multiloculated right pleural effusion. Compressive atelectasis is seen within the right middle and right lower lobes, with minimal aeration superior segment right lower lobe. There is increase in the size and number of left lung pulmonary nodules seen on prior study, consistent with progressive metastatic disease. Index nodule left upper lobe image 34/4 measures 12 x 8 mm, previously 6 x 5 mm. No left-sided effusion. No pneumothorax. Musculoskeletal: Stable right anterolateral second and third rib fractures. No new bony abnormalities. Reconstructed images demonstrate no additional findings. CT ABDOMEN  PELVIS FINDINGS Hepatobiliary: Gallbladder sludge layering dependently within the gallbladder. No calcified gallstones or cholecystitis. Indeterminate 8 mm hypodensity inferior right lobe liver image 59/2, metastatic disease cannot be excluded. No biliary duct dilation. Pancreas: Unremarkable unenhanced appearance. Spleen: Unremarkable unenhanced appearance. Adrenals/Urinary Tract: No urinary tract calculi or obstructive uropathy. The adrenals and bladder are grossly unremarkable. Stomach/Bowel: No bowel obstruction or ileus. No bowel wall thickening or inflammatory change. Vascular/Lymphatic: Aortic atherosclerosis. No enlarged abdominal or pelvic lymph nodes. Reproductive: Prior hysterectomy.  No adnexal masses. Other: No free fluid or free intraperitoneal gas. No abdominal wall hernia. Musculoskeletal: No acute or destructive bony lesions. Reconstructed images demonstrate no additional findings. IMPRESSION:  1. Enlarging right upper lobe mass consistent with progressive lung cancer. 2. Large multiloculated right pleural effusion, increased in size since prior study. Significant compressive atelectasis within the right middle and right lower lobe, with only a small portion of normally aerated lung within the superior segment right lower lobe. 3. New and enlarging left lung pulmonary nodules consistent with progressive metastatic disease. 4. Indeterminate 8 mm hypodensity inferior right lobe liver, not well seen on previous exams. Metastatic disease cannot be excluded. 5. Grossly stable borderline enlarged mediastinal adenopathy. 6. Gallbladder sludge. No evidence of cholelithiasis or cholecystitis. 7.  Aortic Atherosclerosis (ICD10-I70.0). Electronically Signed   By: Sharlet Salina M.D.   On: 09/10/2022 17:50    Labs on Admission: I have personally reviewed following labs CBC: Recent Labs  Lab 09/10/22 1329 09/10/22 1720  WBC 10.6* 10.2  NEUTROABS 7.8*  --   HGB 13.7 13.8  HCT 43.1 44.1  MCV 90.0 89.8   PLT 246 229   Basic Metabolic Panel: Recent Labs  Lab 09/10/22 1329 09/10/22 1720 09/10/22 1857  NA 137 137  --   K 3.4* 3.5  --   CL 97* 98  --   CO2 27 27  --   GLUCOSE 132* 119*  --   BUN 20 20  --   CREATININE 1.81* 1.97*  --   CALCIUM 9.1 9.3  --   MG  --   --  2.5*  PHOS  --   --  1.9*   GFR: Estimated Creatinine Clearance: 30.9 mL/min (A) (by C-G formula based on SCr of 1.97 mg/dL (H)).  Liver Function Tests: Recent Labs  Lab 09/10/22 1329 09/10/22 1720  AST 31 33  ALT 17 17  ALKPHOS 193* 203*  BILITOT 1.8* 2.3*  PROT 8.1 8.5*  ALBUMIN 4.0 4.2   Coagulation Profile: Recent Labs  Lab 09/10/22 1329 09/10/22 1720  INR 10.1* >10.0*   Urine analysis:    Component Value Date/Time   COLORURINE YELLOW (A) 08/16/2022 1302   APPEARANCEUR CLOUDY (A) 08/16/2022 1302   APPEARANCEUR Clear 06/21/2022 1101   LABSPEC 1.011 08/16/2022 1302   PHURINE 6.0 08/16/2022 1302   GLUCOSEU NEGATIVE 08/16/2022 1302   HGBUR SMALL (A) 08/16/2022 1302   BILIRUBINUR NEGATIVE 08/16/2022 1302   BILIRUBINUR Negative 06/21/2022 1101   KETONESUR NEGATIVE 08/16/2022 1302   PROTEINUR 30 (A) 08/16/2022 1302   NITRITE NEGATIVE 08/16/2022 1302   LEUKOCYTESUR LARGE (A) 08/16/2022 1302   CRITICAL CARE Performed by: Dr. Sedalia Muta  Total critical care time: 30 minutes  Critical care time was exclusive of separately billable procedures and treating other patients.  Critical care was necessary to treat or prevent imminent or life-threatening deterioration.  Critical care was time spent personally by me on the following activities: development of treatment plan with patient and/or surrogate as well as nursing, discussions with consultants, evaluation of patient's response to treatment, examination of patient, obtaining history from patient or surrogate, ordering and performing treatments and interventions, ordering and review of laboratory studies, ordering and review of radiographic studies,  pulse oximetry and re-evaluation of patient's condition.  This document was prepared using Dragon Voice Recognition software and may include unintentional dictation errors.  Dr. Sedalia Muta [facility]  If 7PM-7AM, please contact overnight-coverage provider If 7AM-7PM, please contact day coverage provider [email or web address]  09/10/2022, 7:37 PM

## 2022-09-10 NOTE — ED Triage Notes (Addendum)
Pt states she was sent by PCP from cancer center because her INR was elevated today, and states "everything was off." Pt takes coumadin. Pt has noticed blood in saliva, denies other bruising or S/S of acute bleeding. Pt is AOX4, denies complaints at this time.

## 2022-09-10 NOTE — Assessment & Plan Note (Addendum)
Multiloculated, increased in size from prior exam - pulmonary was consulted and patient had a right therapeutic thoracentesis where 1.3 litres of pleural fluid was drained.

## 2022-09-10 NOTE — Hospital Course (Signed)
Vanessa Carlson is a 62 year old female with primary lung adenocarcinoma on the right side, hypertension, hyperlipidemia, paroxysmal atrial fibrillation, on chronic anticoagulation with warfarin, who presents emergency department from oncology office for chief concerns of elevated INR.  Initial vitals in the emergency department showed temperature of 98.6, respiration rate of 17, heart rate of 102, blood pressure 102/64, SpO2 99% on room air.  Serum sodium is 137, potassium 3.5, chloride 98, bicarb 27, BUN of 20, serum creatinine 1.97, GFR of 28, nonfasting blood glucose 119, WBC 10.2, hemoglobin 13.8, platelets of 229.  INR was greater than 10.  At the request of [name], CT chest abdomen and pelvis without contrast was ordered: Read as enlarging right upper lobe mass consistent with progressive lung cancer.  Large multiloculated right pleural effusion, increased in size.  Compressive atelectasis within the right middle and lower lobe.  New and enlarging left lung pulmonary nodules consistent with progressive metastatic disease.  Gallbladder sludge.  No evidence of cholelithiasis or cholecystitis.  Grossly stable borderline enlarged mediastinal adenopathy.  Indeterminate 8 mm hypodensity inferior right lobe liver, not well-seen on prior exam.  Metastatic disease cannot be excluded.  ED treatment: Vitamin K 2.5 P.o.

## 2022-09-10 NOTE — Consult Note (Incomplete)
NAME:  Vanessa Carlson, MRN:  [id number], DOB:  [birth year], LOS: 0 ADMISSION DATE:  09/10/2022, CONSULTATION DATE:  *** REFERRING MD:  ***, CHIEF COMPLAINT:  ***    HPI  [age] y.o with significant PMH of/as below who presented to the ED with chief complaints of    ED Course: Initial vital signs showed HR of beats/minute, BP mm Hg, the RR 30 breaths/minute, and the oxygen saturation % on and a temperature of 98.73F (36.9C).   Pertinent Labs/Diagnostics Findings: Chemistry:Na+/ K+:  Glucose: BUN/Cr.:Calcium:  AST/ALT: CBC: WBC: Hgb/Hct: Plts:  Other Lab findings:   PCT: negative <0.10 Lactic acid:  COVID PCR: Negative, Troponin:  BNP:   Arterial Blood Gas result:  pO2 ***; pCO2 ***; pH ***;  HCO3 ***, %O2 Sat ***.  Imaging:  CXR> CTH> CTA Chest> CT Abd/pelvis> Medications Administered:   Past Medical History  ***  Significant Hospital Events   ***  Consults:  ***  Procedures:  ***  Significant Diagnostic Tests:  : Chest Xray> : Abdominal xray> : Noncontrast CT head> : CTA abdomen and pelvis> : CTA Chest>  Micro Data:  : SARS-CoV-2 PCR> negative : Influenza PCR> negative : Blood culture x2> : Urine Culture> : MRSA PCR>>  : Strep pneumo urinary antigen> : Legionella urinary antigen> : Mycoplasma pneumonia>  Antimicrobials:  Vancomycin Cefepime Azithromycin Ceftriaxone Metronidazole  OBJECTIVE  Blood pressure (!) 93/53, pulse 93, temperature 97.6 F (36.4 C), temperature source Oral, resp. rate 18, SpO2 98 %.       No intake or output data in the 24 hours ending 09/10/22 2225 There were no vitals filed for this visit.   Physical Examination  GENERAL: year-old critically ill patient lying in the bed with no acute distress.  EYES: Pupils equal, round, reactive to light and accommodation. No scleral icterus. Extraocular muscles intact.  HEENT: Head atraumatic, normocephalic. Oropharynx and nasopharynx clear.  NECK:  Supple, no jugular venous distention.  No thyroid enlargement, no tenderness.  LUNGS: Normal breath sounds bilaterally, no wheezing, rales,rhonchi or crepitation. No use of accessory muscles of respiration.  CARDIOVASCULAR: S1, S2 normal. No murmurs, rubs, or gallops.  ABDOMEN: Soft, nontender, nondistended. Bowel sounds present. No organomegaly or mass.  EXTREMITIES: Upper and lower extremities are atraumatic in appearance without tenderness or deformity. No swelling or erythema. Full range of motion is noted to all joints. Muscle strength is 5/5 bilaterally. Tendon function is normal. Capillary refill is less than 3 seconds in all extremities. Pulses palpable.  NEUROLOGIC:The patient is awake, alert and oriented to person, place, and time with normal speech. Motor function is normal with muscle strength 5/5 bilaterally to upper and lower extremities. Sensation is intact bilaterally. Reflexes 2+ bilaterally. Cranial nerves are intact. Cerebellar function is intact. Memory is normal and thought process is intact. Gait not checked.  PSYCHIATRIC: Appropriate mood and affect. The patient is  SKIN: No obvious rash, lesion, or ulcer.   Labs/imaging that I {ACTIONS; HAVE/HAVE NOT:19434}personally reviewed  (right click and "Reselect all SmartList Selections" daily)     Labs   CBC: Recent Labs  Lab 09/10/22 1329 09/10/22 1720 09/10/22 1959  WBC 10.6* 10.2  --   NEUTROABS 7.8*  --   --   HGB 13.7 13.8 13.4  HCT 43.1 44.1 43.3  MCV 90.0 89.8  --   PLT 246 229  --     Basic Metabolic Panel: Recent Labs  Lab 09/10/22 1329 09/10/22 1720 09/10/22 1857  NA 137 137  --  K 3.4* 3.5  --   CL 97* 98  --   CO2 27 27  --   GLUCOSE 132* 119*  --   BUN 20 20  --   CREATININE 1.81* 1.97*  --   CALCIUM 9.1 9.3  --   MG  --   --  2.5*  PHOS  --   --  1.9*   GFR: Estimated Creatinine Clearance: 30.9 mL/min (A) (by C-G formula based on SCr of 1.97 mg/dL (H)). Recent Labs  Lab 09/10/22 1329 09/10/22 1720  WBC 10.6* 10.2     Liver Function Tests: Recent Labs  Lab 09/10/22 1329 09/10/22 1720  AST 31 33  ALT 17 17  ALKPHOS 193* 203*  BILITOT 1.8* 2.3*  PROT 8.1 8.5*  ALBUMIN 4.0 4.2   No results for input(s): "LIPASE", "AMYLASE" in the last 168 hours. No results for input(s): "AMMONIA" in the last 168 hours.  ABG No results found for: "PHART", "PCO2ART", "PO2ART", "HCO3", "TCO2", "ACIDBASEDEF", "O2SAT"   Coagulation Profile: Recent Labs  Lab 09/10/22 1329 09/10/22 1720 09/10/22 2152  INR 10.1* >10.0* 1.4*    Cardiac Enzymes: No results for input(s): "CKTOTAL", "CKMB", "CKMBINDEX", "TROPONINI" in the last 168 hours.  HbA1C: Hgb A1c MFr Bld  Date/Time Value Ref Range Status  08/20/2022 01:50 PM 6.2 (H) 4.8 - 5.6 % Final    Comment:    (NOTE)         Prediabetes: 5.7 - 6.4         Diabetes: >6.4         Glycemic control for adults with diabetes: <7.0   12/14/2019 09:09 AM 5.9 (H) 4.8 - 5.6 % Final    Comment:             Prediabetes: 5.7 - 6.4          Diabetes: >6.4          Glycemic control for adults with diabetes: <7.0     CBG: No results for input(s): "GLUCAP" in the last 168 hours.  Review of Systems:   ***  Past Medical History  She,  has a past medical history of AICD (automatic cardioverter/defibrillator) present, Anterior myocardial infarction [facility]), Arthritis, Cancer of upper lobe of right lung (HCC) (09/2017), Cervical cancer (HCC), Chronic combined systolic and diastolic CHF (congestive heart failure) (HCC), Coronary artery disease, Essential hypertension, Ischemic cardiomyopathy, Mass of upper lobe of right lung, Mitral valve disease, and Personal history of radiation therapy.   Surgical History    Past Surgical History:  Procedure Laterality Date   APPENDECTOMY  1998   BREAST EXCISIONAL BIOPSY Left 80s   benign   CARDIAC CATHETERIZATION     CARDIAC DEFIBRILLATOR PLACEMENT     COLONOSCOPY WITH PROPOFOL N/A 01/11/2020   Procedure: COLONOSCOPY WITH PROPOFOL;   Surgeon: Wyline Mood, MD;  Location[facility] ENDOSCOPY;  Service: Gastroenterology;  Laterality: N/A;   IR IMAGING GUIDED PORT INSERTION  06/29/2022   PORTA CATH INSERTION     RADICAL HYSTERECTOMY  1998   REPLACEMENT TOTAL KNEE Right 12/10/2021   SHOULDER SURGERY Bilateral    rotator cuff tears   VALVE REPLACEMENT     Mitral valve; 27 mm [employer] bileaflet valve     Social History   reports that she quit smoking about 26 years ago. Her smoking use included cigarettes. She has a 4.00 pack-year smoking history. She has been exposed to tobacco smoke. She has never used smokeless tobacco. She reports that she does not  drink alcohol and does not use drugs.   Family History   Her family history includes Breast cancer in her maternal aunt and maternal aunt; Colon cancer in her maternal uncle; Diabetes in her maternal grandmother and mother; Emphysema in her father; Heart attack in her mother; Hypertension in her mother; Kidney disease in her maternal grandmother.   Allergies No Known Allergies   Home Medications  Prior to Admission medications   Medication Sig Start Date End Date Taking? Authorizing Provider  aspirin 81 MG chewable tablet Chew 81 mg by mouth daily.   Yes [provider]  atorvastatin (LIPITOR) 80 MG tablet TAKE 1 TABLET BY MOUTH ONCE DAILY. OFFICE VISIT AND LABS NEEDED FOR FURTHER REFILLS. 08/31/22  Yes Bacigalupo, Marzella Schlein, MD  ENTRESTO 24-26 MG TAKE 1 TABLET BY MOUTH TWICE DAILY *SCHEDULE OFFICE VISIT FOR FURTHER REFILLS* 08/02/22  Yes End, Cristal Deer, MD  ezetimibe (ZETIA) 10 MG tablet Take 1 tablet (10 mg total) by mouth daily. 07/12/22  Yes End, Cristal Deer, MD  furosemide (LASIX) 40 MG tablet Take 2 tablets (80 mg total) by mouth 2 (two) times daily. 01/21/22  Yes End, Cristal Deer, MD  gabapentin (NEURONTIN) 300 MG capsule Take 1 capsule (300 mg total) by mouth 2 (two) times daily. 10/26/21  Yes Bacigalupo, Marzella Schlein, MD  lidocaine-prilocaine (EMLA) cream Apply to  affected area once 06/25/22  Yes Creig Hines, MD  metoprolol succinate (TOPROL-XL) 25 MG 24 hr tablet TAKE 1 TABLET BY MOUTH TWICE DAILY IN THE MORNING AND AT BEDTIME 06/21/22  Yes End, Cristal Deer, MD  ondansetron (ZOFRAN) 8 MG tablet Take 1 tablet (8 mg total) by mouth every 8 (eight) hours as needed for nausea or vomiting. 06/25/22  Yes Creig Hines, MD  oxyCODONE (OXY IR/ROXICODONE) 5 MG immediate release tablet Take 1 tablet (5 mg total) by mouth every 4 (four) hours as needed for severe pain. 09/10/22  Yes Creig Hines, MD  prochlorperazine (COMPAZINE) 10 MG tablet Take 1 tablet (10 mg total) by mouth every 6 (six) hours as needed for nausea or vomiting. 06/25/22  Yes Creig Hines, MD  spironolactone (ALDACTONE) 25 MG tablet Take 1 tablet (25 mg total) by mouth daily. 04/27/22  Yes Caro Laroche, DO  warfarin (COUMADIN) 5 MG tablet 5 mg daily except 7.5 mg on Mon and Fri Patient taking differently: Take 5 mg by mouth every evening. 05/14/22  Yes Drubel, Lillia Abed, PA-C  metolazone (ZAROXOLYN) 2.5 MG tablet Take 1 tablet (2.5 mg) by mouth once a week. Take 30 minutes prior to your furosemide dose. Patient not taking: Reported on 09/10/2022 05/20/22   End, Cristal Deer, MD  oxybutynin (DITROPAN-XL) 10 MG 24 hr tablet Take 1 tablet (10 mg total) by mouth daily. Patient not taking: Reported on 09/10/2022 06/21/22   Alfredo Martinez, MD  potassium chloride SA (KLOR-CON M) 20 MEQ tablet Take 2 tablets (40 mEq total) by mouth daily. Patient not taking: Reported on 09/10/2022 05/20/22   End, Cristal Deer, MD      Active Hospital Problem list   ***  Assessment & Plan:  ***  Best practice:  Diet:  {RUOO:00180} Pain/Anxiety/Delirium protocol (if indicated): {Pain/Anxiety/Delirium:26941} VAP protocol (if indicated): {VAP:29640} DVT prophylaxis: {DVT Prophylaxis:26933} GI prophylaxis: {GI:26934} Glucose control:  {Glucose Control:26935} Central venous access:  {Central Venous  Access:26936} Arterial line:  {Central Venous Access:26936} Foley:  {Central Venous Access:26936} Mobility:  {Mobility:26937}  PT consulted: {PT Consult:26938} Last date of multidisciplinary goals of care discussion [***] Code Status:  {Code Status:26939}  Disposition: ***   = Goals of Care = Code Status Order: @CODE @   Primary Emergency Contact: Valent,Tosin, Home Phone: [phone] Wishes to pursue full aggressive treatment and intervention options, including CPR and intubation, but goals of care will be addressed on going with family if that should become necessary.  Critical care time: 45 minutes       [phone] DNP, CCRN, FNP-C, AGACNP-BC Acute Care NP [facility] on call pager [phone]

## 2022-09-10 NOTE — Assessment & Plan Note (Signed)
-  With baseline CKD stage IIIa - Check BMP in the a.m.

## 2022-09-10 NOTE — Telephone Encounter (Signed)
Call returned to patient and advised to go to ER immediately due to her results and giving her the values; and she states that she is on her way now.

## 2022-09-11 ENCOUNTER — Inpatient Hospital Stay (HOSPITAL_COMMUNITY)
Admit: 2022-09-11 | Discharge: 2022-09-11 | Disposition: A | Discharge status: Home / Self Care | Payer: Medicare HMO | Attending: Cardiovascular Disease | Admitting: Cardiovascular Disease

## 2022-09-11 DIAGNOSIS — I5022 Chronic systolic (congestive) heart failure: Secondary | ICD-10-CM

## 2022-09-11 DIAGNOSIS — I5021 Acute systolic (congestive) heart failure: Secondary | ICD-10-CM | POA: Diagnosis not present

## 2022-09-11 DIAGNOSIS — N179 Acute kidney failure, unspecified: Secondary | ICD-10-CM | POA: Diagnosis not present

## 2022-09-11 DIAGNOSIS — N189 Chronic kidney disease, unspecified: Secondary | ICD-10-CM

## 2022-09-11 DIAGNOSIS — R042 Hemoptysis: Secondary | ICD-10-CM | POA: Diagnosis not present

## 2022-09-11 DIAGNOSIS — R791 Abnormal coagulation profile: Secondary | ICD-10-CM | POA: Diagnosis not present

## 2022-09-11 LAB — URINALYSIS, COMPLETE (UACMP) WITH MICROSCOPIC
Bilirubin Urine: NEGATIVE
Glucose, UA: NEGATIVE mg/dL
Hgb urine dipstick: NEGATIVE
Ketones, ur: NEGATIVE mg/dL
Leukocytes,Ua: NEGATIVE
Nitrite: NEGATIVE
Protein, ur: 30 mg/dL — AB
Specific Gravity, Urine: 1.017 (ref 1.005–1.030)
pH: 5 (ref 5.0–8.0)

## 2022-09-11 LAB — BASIC METABOLIC PANEL
Anion gap: 10 (ref 5–15)
BUN: 23 mg/dL (ref 8–23)
CO2: 26 mmol/L (ref 22–32)
Calcium: 8.6 mg/dL — ABNORMAL LOW (ref 8.9–10.3)
Chloride: 100 mmol/L (ref 98–111)
Creatinine, Ser: 2.16 mg/dL — ABNORMAL HIGH (ref 0.44–1.00)
GFR, Estimated: 25 mL/min — ABNORMAL LOW (ref >60)
Glucose, Bld: 109 mg/dL — ABNORMAL HIGH (ref 70–99)
Potassium: 3.3 mmol/L — ABNORMAL LOW (ref 3.5–5.1)
Sodium: 136 mmol/L (ref 135–145)

## 2022-09-11 LAB — ECHOCARDIOGRAM COMPLETE
AR max vel: 1.28 cm2
AV Peak grad: 6.2 mmHg
Ao pk vel: 1.24 m/s
Area-P 1/2: 3.27 cm2
Calc EF: 43.9 %
Height: 62 in
MV VTI: 1.27 cm2
S' Lateral: 4.7 cm
Single Plane A2C EF: 50.4 %
Single Plane A4C EF: 32.6 %
Weight: 3262.81 oz

## 2022-09-11 LAB — CBC
HCT: 41.2 % (ref 36.0–46.0)
Hemoglobin: 12.9 g/dL (ref 12.0–15.0)
MCH: 28.7 pg (ref 26.0–34.0)
MCHC: 31.3 g/dL (ref 30.0–36.0)
MCV: 91.8 fL (ref 80.0–100.0)
Platelets: 200 10*3/uL (ref 150–400)
RBC: 4.49 MIL/uL (ref 3.87–5.11)
RDW: 16.9 % — ABNORMAL HIGH (ref 11.5–15.5)
WBC: 11.4 10*3/uL — ABNORMAL HIGH (ref 4.0–10.5)
nRBC: 0 % (ref 0.0–0.2)

## 2022-09-11 LAB — BRAIN NATRIURETIC PEPTIDE: B Natriuretic Peptide: 67.2 pg/mL (ref 0.0–100.0)

## 2022-09-11 LAB — PHOSPHORUS: Phosphorus: 2.2 mg/dL — ABNORMAL LOW (ref 2.5–4.6)

## 2022-09-11 LAB — HEPARIN LEVEL (UNFRACTIONATED): Heparin Unfractionated: 0.39 IU/mL (ref 0.30–0.70)

## 2022-09-11 LAB — PROCALCITONIN: Procalcitonin: 0.32 ng/mL

## 2022-09-11 LAB — MRSA NEXT GEN BY PCR, NASAL: MRSA by PCR Next Gen: NOT DETECTED

## 2022-09-11 LAB — PROTIME-INR
INR: 1.3 — ABNORMAL HIGH (ref 0.8–1.2)
Prothrombin Time: 16.4 seconds — ABNORMAL HIGH (ref 11.4–15.2)

## 2022-09-11 LAB — APTT: aPTT: 36 seconds (ref 24–36)

## 2022-09-11 MED ORDER — WARFARIN - PHARMACIST DOSING INPATIENT: Freq: Every day | Status: DC

## 2022-09-11 MED ORDER — EZETIMIBE 10 MG PO TABS
10.0000 mg | ORAL_TABLET | Freq: Every day | ORAL | Status: DC
  Administered 2022-09-11 – 2022-09-16 (×6): 10 mg via ORAL
  Filled 2022-09-11 (×7): qty 1

## 2022-09-11 MED ORDER — POTASSIUM CHLORIDE CRYS ER 20 MEQ PO TBCR
40.0000 meq | EXTENDED_RELEASE_TABLET | Freq: Once | ORAL | Status: AC
  Administered 2022-09-11: 40 meq via ORAL
  Filled 2022-09-11: qty 2

## 2022-09-11 MED ORDER — ENSURE ENLIVE PO LIQD
237.0000 mL | Freq: Two times a day (BID) | ORAL | Status: DC
  Administered 2022-09-12 – 2022-09-16 (×6): 237 mL via ORAL

## 2022-09-11 MED ORDER — PERFLUTREN LIPID MICROSPHERE
1.0000 mL | INTRAVENOUS | Status: AC | PRN
  Administered 2022-09-11: 4 mL via INTRAVENOUS

## 2022-09-11 MED ORDER — ATORVASTATIN CALCIUM 80 MG PO TABS
80.0000 mg | ORAL_TABLET | Freq: Every day | ORAL | Status: DC
  Administered 2022-09-11 – 2022-09-16 (×6): 80 mg via ORAL
  Filled 2022-09-11 (×3): qty 4
  Filled 2022-09-11 (×4): qty 1

## 2022-09-11 MED ORDER — HEPARIN (PORCINE) 25000 UT/250ML-% IV SOLN
1000.0000 [IU]/h | INTRAVENOUS | Status: DC
  Administered 2022-09-11 – 2022-09-12 (×2): 1000 [IU]/h via INTRAVENOUS
  Filled 2022-09-11 (×2): qty 250

## 2022-09-11 MED ORDER — NOREPINEPHRINE 4 MG/250ML-% IV SOLN: 2.0000 ug/min | INTRAVENOUS | Status: DC

## 2022-09-11 MED ORDER — POTASSIUM PHOSPHATES 15 MMOLE/5ML IV SOLN
15.0000 mmol | Freq: Once | INTRAVENOUS | Status: AC
  Administered 2022-09-11: 15 mmol via INTRAVENOUS
  Filled 2022-09-11: qty 5

## 2022-09-11 MED ORDER — CHLORHEXIDINE GLUCONATE CLOTH 2 % EX PADS
6.0000 | MEDICATED_PAD | Freq: Every day | CUTANEOUS | Status: DC
  Administered 2022-09-14 – 2022-09-15 (×3): 6 via TOPICAL

## 2022-09-11 MED ORDER — ADULT MULTIVITAMIN W/MINERALS CH
1.0000 | ORAL_TABLET | Freq: Every day | ORAL | Status: DC
  Administered 2022-09-11 – 2022-09-13 (×3): 1 via ORAL
  Administered 2022-09-14: 0.5 via ORAL
  Administered 2022-09-16: 1 via ORAL
  Filled 2022-09-11 (×7): qty 1

## 2022-09-11 MED ORDER — SODIUM CHLORIDE 0.9 % IV SOLN
250.0000 mL | INTRAVENOUS | Status: DC
  Administered 2022-09-14: 250 mL via INTRAVENOUS

## 2022-09-11 MED ORDER — WARFARIN SODIUM 5 MG PO TABS
5.0000 mg | ORAL_TABLET | Freq: Once | ORAL | Status: AC
  Administered 2022-09-11: 5 mg via ORAL
  Filled 2022-09-11: qty 1

## 2022-09-11 MED ORDER — GABAPENTIN 300 MG PO CAPS
300.0000 mg | ORAL_CAPSULE | Freq: Two times a day (BID) | ORAL | Status: DC
  Administered 2022-09-11 – 2022-09-16 (×11): 300 mg via ORAL
  Filled 2022-09-11 (×12): qty 1

## 2022-09-11 MED ORDER — HEPARIN BOLUS VIA INFUSION
1800.0000 [IU] | Freq: Once | INTRAVENOUS | Status: AC
  Administered 2022-09-11: 1800 [IU] via INTRAVENOUS
  Filled 2022-09-11: qty 1800

## 2022-09-11 NOTE — ED Notes (Signed)
Transfer of care report given over telephone to Debbora Dus.  Pt current status, Dx, Tx, lab work, and medications discussed.  All questions asked were answered.

## 2022-09-11 NOTE — Progress Notes (Signed)
Initial Nutrition Assessment  DOCUMENTATION CODES:   Obesity unspecified  INTERVENTION:  - Add Ensure Enlive po BID, each supplement provides 350 kcal and 20 grams of protein.  - Add MVI q day.   NUTRITION DIAGNOSIS:   Inadequate oral intake related to inability to eat as evidenced by meal completion < 50%.  GOAL:   Patient will meet greater than or equal to 90% of their needs  MONITOR:   PO intake, Supplement acceptance  REASON FOR ASSESSMENT:   Malnutrition Screening Tool    ASSESSMENT:   62 y.o. female admits related to elevated INR. PMH includes: lung adenocarcinoma, HTN, HLD, afib.  Meds reviewed: lipitor, warfarin. Labs reviewed: K low, phos low.   Pt out of room at time of assessment. Pt with stage IV lung cancer. Per record, pt has experienced a 9.7% wt loss in 3 months. Per record, pt only ate 25% of her breakfast this am. RD will add Ensure BID for now. RD will continue to monitor PO intakes.   NUTRITION - FOCUSED PHYSICAL EXAM:  Unable to assess, attempt at follow up.   Diet Order:   Diet Order             Diet Heart Room service appropriate? Yes; Fluid consistency: Thin  Diet effective now                   EDUCATION NEEDS:   Not appropriate for education at this time  Skin:  Skin Assessment: Reviewed RN Assessment  Last BM:  1/10  Height:   Ht Readings from Last 1 Encounters:  09/11/22 5\' 2"  (1.575 m)    Weight:   Wt Readings from Last 1 Encounters:  09/11/22 92.5 kg    Ideal Body Weight:     BMI:  Body mass index is 37.3 kg/m.  Estimated Nutritional Needs:   Kcal:  1850-2310 kcals  Protein:  90-115 gm  Fluid:  >/= 1.8 L  09/13/22, RD, LDN, CNSC.

## 2022-09-11 NOTE — Consult Note (Signed)
ANTICOAGULATION CONSULT NOTE - Initial Consult  Pharmacy Consult for Heparin Infusion with Bridge to Warfarin Indication: mMVR, AFib  No Known Allergies  Patient Measurements: Height: 5\' 2"  (157.5 cm) Weight: 92.5 kg (203 lb 14.8 oz) IBW/kg (Calculated) : 50.1 Heparin Dosing Weight: 71.6 kg  Vital Signs: Temp: 98.4 F (36.9 C) (01/13 0800) Temp Source: Oral (01/13 0800) BP: 93/62 (01/13 1100) Pulse Rate: 95 (01/13 1100)  Labs: Recent Labs    09/10/22 1329 09/10/22 1720 09/10/22 1959 09/10/22 2152 09/11/22 0444 09/11/22 1101 09/11/22 1737  HGB 13.7 13.8 13.4  --  12.9  --   --   HCT 43.1 44.1 43.3  --  41.2  --   --   PLT 246 229  --   --  200  --   --   APTT 177*  --   --   --   --  36  --   LABPROT 79.8* 84.1*  --  17.1* 16.4*  --   --   INR 10.1* >10.0*  --  1.4* 1.3*  --   --   HEPARINUNFRC  --   --   --   --   --   --  0.39  CREATININE 1.81* 1.97*  --   --  2.16*  --   --      Estimated Creatinine Clearance: 28.6 mL/min (A) (by C-G formula based on SCr of 2.16 mg/dL (H)).   Medical History: Past Medical History:  Diagnosis Date   AICD (automatic cardioverter/defibrillator) present    Anterior myocardial infarction [facility])    a. 10/2014 - occluded LAD, complicated by cardiogenic shock-->Med Rx as interventional team was unable to open LAD.   Arthritis    arthirits of knee on right   Cancer of upper lobe of right lung (HCC) 09/2017   radiation therapy right side   Cervical cancer (HCC)    in remission for ~20 yrs, s/p hysterectomy   Chronic combined systolic and diastolic CHF (congestive heart failure) (HCC)    a. 10/2015 Echo: EF 20-25%, sev diast dysfxn, mildly reduced RV fxn. Nl MV prosthesis; b. 12/2017 Echo: EF 20-25%, diff HK. Sev ant/antsept HK, apical AK. Mild MS (mean graad 01/2018). Mod TR. PASP .   Coronary artery disease    a. 10/2014 Ant STEMI/Cath: LAD 100p ->Med managed as lesion could not be crossed-->complicated by CGS and post-MI  pericarditis (UVA).   Essential hypertension    Ischemic cardiomyopathy    a. 03/2015 s/p MDT single lead AICD;  b. 10/2015 Echo: EF 20-25%;  c. 12/2017 Echo: EF 20-25%, diff HK.   Mass of upper lobe of right lung    a. noted on CXR & CT 08/2017 w/ abnl PET CT.   Mitral valve disease    a. 2006 s/p MVR w/ 66mm SJM bileaflet mechanical valve-->chronic coumadin/ASA; b. 12/2017 Echo: Mild MS. Mean gradient 01/2018.   Personal history of radiation therapy    f/u lung ca    Medications:  Scheduled:   atorvastatin  80 mg Oral Daily   Chlorhexidine Gluconate Cloth  6 each Topical Daily   ezetimibe  10 mg Oral Daily   [START ON 09/12/2022] feeding supplement  237 mL Oral BID BM   gabapentin  300 mg Oral BID   midodrine  10 mg Oral TID WC   multivitamin with minerals  1 tablet Oral Daily   Warfarin - Pharmacist Dosing Inpatient   Does not apply q1600   Infusions:   sodium  chloride Stopped (09/11/22 0721)   heparin 1,000 Units/hr (09/11/22 1319)   potassium PHOSPHATE IVPB (in mmol) 15 mmol (09/11/22 1722)   PRN: acetaminophen **OR** acetaminophen, ondansetron **OR** ondansetron (ZOFRAN) IV, oxyCODONE, senna-docusate  PTA Warfarin: 35 mg/wk 5 mg daily   Assessment: Vanessa Carlson is a 62 y.o. female presenting with hemoptysis with INR >10. 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 Patient was on [facility] PTA per chart review. Last dose of warfarin was 1/11 @ 2200. Warfarin was reversed with Kcentra and vitamin K 10 mg on 1/12, and patient was admitted to ICU. Pharmacy has been consulted to initiate and manage heparin infusion.   Baseline Labs: aPTT 36, PT 16.4, INR 1.3, Hgb 12.9, Hct 41.2, Plt 200   Goal of Therapy:  INR 2.5-3.5 Heparin level 0.3-0.7 units/ml Monitor platelets by anticoagulation protocol:  Yes   Monitoring Heparin:  Date Time HL Rate/Comment  1/13 1737 0.39 Therapeutic x1  Warfarin: Date    INR      Warfarin Dose    1/13 1.3 5 mg 1/11 @ 2200 then Kcentra + vitK 10 mg 1/12 @ 2000   Plan:  Heparin Continue heparin infusion at 1000 units/hr Check confirmatory HL in 6 hours  Continue to monitor H&H and platelets daily while on heparin infusion  Warfarin Give warfarin 5 mg x1 dose today  Anticipate delay in INR increase over next few days given reversal with Kcentra and vitamin K Continue on parenteral bridge therapy until INR therapeutic  Check INR daily until stable Check CBC at least weekly while on warfarin   Leonie Douglas, PharmD 09/11/2022 6:10 PM

## 2022-09-11 NOTE — Consult Note (Signed)
NAME:  Vanessa Carlson, MRN:  [id number], DOB:  Jun 21, 1961, LOS: 1 ADMISSION DATE:  09/10/2022, CONSULTATION DATE:  09/10/22 REFERRING MD:  Cox Amy, CHIEF COMPLAINT:  Hemoptysis    HPI  62 y.o. 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   ED Course: Initial vital signs showed HR of 109 beats/minute, BP 118/72 mm Hg, the RR 17 breaths/minute, and the oxygen saturation 96 % on and a temperature of 98.66F (37.0C).    Pertinent Labs/Diagnostics Findings: Chemistry:Na+/ K+: 137/3.5 Glucose:119 BUN/Cr.:20/1.97 CBC: Unremarkable Other Lab findings: PT/INR: 84.1/>10.0  P Imaging:CT Chest Abd/pelvis>see below   Significant Hospital Events   1/12: Admit to stepdown with Hemoptysis and supratherapeutic INR, developed hypotension. PCCM consulted for possible pressor requirement 09/11/22- patient s/p TTE today.  Ordered IR thoracentesis due to large atelectasis and sorrounding effusion. Family at bedside    Consults:  PCCM  Procedures:  None  Significant Diagnostic Tests:  1/12: CT Chest/Abd/Pelvis>  IMPRESSION: 1. Enlarging right upper lobe mass consistent with progressive lung cancer. 2. Large multiloculated right pleural effusion, increased in size since prior study. Significant compressive atelectasis within the right middle and right lower lobe, with only a small portion of normally aerated lung within the superior segment right lower lobe. 3. New and enlarging left lung pulmonary nodules consistent with progressive metastatic disease. 4. Indeterminate 8 mm hypodensity inferior  right lobe liver, not well seen on previous exams. Metastatic disease cannot be excluded. 5. Grossly stable borderline enlarged mediastinal adenopathy. 6. Gallbladder sludge. No evidence of cholelithiasis or cholecystitis. 7.  Aortic Atherosclerosis (ICD10-I70.0).  Micro Data:  None  Antimicrobials:  None  OBJECTIVE  Blood pressure 93/62, pulse 95, temperature 98.4 F (36.9 C), temperature source Oral, resp. rate (!) 22, height 5\' 2"  (1.575 m), weight 92.5 kg, SpO2 98 %.        Intake/Output Summary (Last 24 hours) at 09/11/2022 1621 Last data filed at 09/11/2022 1030 Gross per 24 hour  Intake 256 ml  Output 80 ml  Net 176 ml   Filed Weights   09/11/22 0332  Weight: 92.5 kg   Physical Examination  GENERAL:62  year-old critically ill patient lying in the bed with no acute distress.  EYES: Pupils equal, round, reactive to light and accommodation. No scleral icterus. Extraocular muscles intact.  HEENT: Head atraumatic, normocephalic. Oropharynx and nasopharynx clear.  NECK:  Supple, no jugular venous distention. No thyroid enlargement, no tenderness.  LUNGS: Normal breath sounds bilaterally, no wheezing, rales,rhonchi or crepitation. No use of accessory muscles of respiration.  CARDIOVASCULAR: S1, S2 normal. No murmurs, rubs, or gallops.  ABDOMEN: Soft, nontender, nondistended. Bowel sounds present. No organomegaly or mass.  EXTREMITIES: Upper and lower extremities are atraumatic. No swelling or erythema. Muscle strength is 5/5 bilaterally. Capillary refill> 3 seconds in all extremities. Pulses palpable distally. NEUROLOGIC:The patient is awake, alert and oriented  x 3 with normal speech. Sensation is intact bilaterally. Cranial nerves are intact.  Gait not checked.  PSYCHIATRIC: Appropriate mood and affect.  SKIN: No obvious rash, lesion, or ulcer.   Labs/imaging that I havepersonally reviewed  (right click and "Reselect all SmartList Selections" daily)  Labs    CBC: Recent Labs  Lab 09/10/22 1329 09/10/22 1720 09/10/22 1959 09/11/22 0444  WBC 10.6* 10.2  --  11.4*  NEUTROABS 7.8*  --   --   --   HGB 13.7 13.8 13.4 12.9  HCT 43.1 44.1 43.3 41.2  MCV 90.0 89.8  --  91.8  PLT 246 229  --  200     Basic Metabolic Panel: Recent Labs  Lab 09/10/22 1329 09/10/22 1720 09/10/22 1857 09/11/22 0444 09/11/22 1057  NA 137 137  --  136  --   K 3.4* 3.5  --  3.3*  --   CL 97* 98  --  100  --   CO2 27 27  --  26  --   GLUCOSE 132* 119*  --  109*  --   BUN 20 20  --  23  --   CREATININE 1.81* 1.97*  --  2.16*  --   CALCIUM 9.1 9.3  --  8.6*  --   MG  --   --  2.5*  --   --   PHOS  --   --  1.9*  --  2.2*    GFR: Estimated Creatinine Clearance: 28.6 mL/min (A) (by C-G formula based on SCr of 2.16 mg/dL (H)). Recent Labs  Lab 09/10/22 1329 09/10/22 1720 09/11/22 0436 09/11/22 0444  PROCALCITON  --   --  0.32  --   WBC 10.6* 10.2  --  11.4*     Liver Function Tests: Recent Labs  Lab 09/10/22 1329 09/10/22 1720  AST 31 33  ALT 17 17  ALKPHOS 193* 203*  BILITOT 1.8* 2.3*  PROT 8.1 8.5*  ALBUMIN 4.0 4.2    No results for input(s): "LIPASE", "AMYLASE" in the last 168 hours. No results for input(s): "AMMONIA" in the last 168 hours.  ABG No results found for: "PHART", "PCO2ART", "PO2ART", "HCO3", "TCO2", "ACIDBASEDEF", "O2SAT"   Coagulation Profile: Recent Labs  Lab 09/10/22 1329 09/10/22 1720 09/10/22 2152 09/11/22 0444  INR 10.1* >10.0* 1.4* 1.3*     Cardiac Enzymes: No results for input(s): "CKTOTAL", "CKMB", "CKMBINDEX", "TROPONINI" in the last 168 hours.  HbA1C: Hgb A1c MFr Bld  Date/Time Value Ref Range Status  08/20/2022 01:50 PM 6.2 (H) 4.8 - 5.6 % Final    Comment:    (NOTE)         Prediabetes: 5.7 - 6.4         Diabetes: >6.4         Glycemic control for adults with diabetes: <7.0   12/14/2019 09:09 AM 5.9 (H) 4.8 - 5.6 % Final    Comment:             Prediabetes: 5.7 - 6.4          Diabetes:  >6.4          Glycemic control for adults with diabetes: <7.0     CBG: No results for input(s): "GLUCAP" in the last 168 hours.  Review of Systems:    10 point ROS done and is negative except as per HPI and dyspnea   Past Medical History  She,  has a past medical history of AICD (automatic cardioverter/defibrillator) present, Anterior myocardial infarction [facility]), Arthritis, Cancer of upper lobe of right lung (HCC) (09/2017), Cervical cancer (HCC), Chronic combined systolic and diastolic CHF (congestive heart failure) (HCC), Coronary artery disease, Essential hypertension, Ischemic cardiomyopathy, Mass of upper lobe of right lung, Mitral valve disease, and Personal history of  radiation therapy.   Surgical History    Past Surgical History:  Procedure Laterality Date   APPENDECTOMY  1998   BREAST EXCISIONAL BIOPSY Left 80s   benign   CARDIAC CATHETERIZATION     CARDIAC DEFIBRILLATOR PLACEMENT     COLONOSCOPY WITH PROPOFOL N/A 01/11/2020   Procedure: COLONOSCOPY WITH PROPOFOL;  Surgeon: Wyline Mood, MD;  Location: [facility] ENDOSCOPY;  Service: Gastroenterology;  Laterality: N/A;   IR IMAGING GUIDED PORT INSERTION  06/29/2022   PORTA CATH INSERTION     RADICAL HYSTERECTOMY  1998   REPLACEMENT TOTAL KNEE Right 12/10/2021   SHOULDER SURGERY Bilateral    rotator cuff tears   VALVE REPLACEMENT     Mitral valve; 27 mm [employer] bileaflet valve     Social History   reports that she quit smoking about 26 years ago. Her smoking use included cigarettes. She has a 4.00 pack-year smoking history. She has been exposed to tobacco smoke. She has never used smokeless tobacco. She reports that she does not drink alcohol and does not use drugs.   Family History   Her family history includes Breast cancer in her maternal aunt and maternal aunt; Colon cancer in her maternal uncle; Diabetes in her maternal grandmother and mother; Emphysema in her father; Heart attack in her mother; Hypertension in her  mother; Kidney disease in her maternal grandmother.   Allergies No Known Allergies   Home Medications  Prior to Admission medications   Medication Sig Start Date End Date Taking? Authorizing Provider  aspirin 81 MG chewable tablet Chew 81 mg by mouth daily.   Yes [provider]  atorvastatin (LIPITOR) 80 MG tablet TAKE 1 TABLET BY MOUTH ONCE DAILY. OFFICE VISIT AND LABS NEEDED FOR FURTHER REFILLS. 08/31/22  Yes Bacigalupo, Marzella Schlein, MD  ENTRESTO 24-26 MG TAKE 1 TABLET BY MOUTH TWICE DAILY *SCHEDULE OFFICE VISIT FOR FURTHER REFILLS* 08/02/22  Yes End, Cristal Deer, MD  ezetimibe (ZETIA) 10 MG tablet Take 1 tablet (10 mg total) by mouth daily. 07/12/22  Yes End, Cristal Deer, MD  furosemide (LASIX) 40 MG tablet Take 2 tablets (80 mg total) by mouth 2 (two) times daily. 01/21/22  Yes End, Cristal Deer, MD  gabapentin (NEURONTIN) 300 MG capsule Take 1 capsule (300 mg total) by mouth 2 (two) times daily. 10/26/21  Yes Bacigalupo, Marzella Schlein, MD  lidocaine-prilocaine (EMLA) cream Apply to affected area once 06/25/22  Yes Creig Hines, MD  metoprolol succinate (TOPROL-XL) 25 MG 24 hr tablet TAKE 1 TABLET BY MOUTH TWICE DAILY IN THE MORNING AND AT BEDTIME 06/21/22  Yes End, Cristal Deer, MD  ondansetron (ZOFRAN) 8 MG tablet Take 1 tablet (8 mg total) by mouth every 8 (eight) hours as needed for nausea or vomiting. 06/25/22  Yes Creig Hines, MD  oxyCODONE (OXY IR/ROXICODONE) 5 MG immediate release tablet Take 1 tablet (5 mg total) by mouth every 4 (four) hours as needed for severe pain. 09/10/22  Yes Creig Hines, MD  prochlorperazine (COMPAZINE) 10 MG tablet Take 1 tablet (10 mg total) by mouth every 6 (six) hours as needed for nausea or vomiting. 06/25/22  Yes Creig Hines, MD  spironolactone (ALDACTONE) 25 MG tablet Take 1 tablet (25 mg total) by mouth daily. 04/27/22  Yes Caro Laroche, DO  warfarin (COUMADIN) 5 MG tablet 5 mg daily except 7.5 mg on Mon and Fri Patient taking differently:  Take 5 mg by mouth every evening. 05/14/22  Yes Alfredia Ferguson, PA-C  metolazone (ZAROXOLYN) 2.5 MG tablet  Take 1 tablet (2.5 mg) by mouth once a week. Take 30 minutes prior to your furosemide dose. Patient not taking: Reported on 09/10/2022 05/20/22   End, Cristal Deer, MD  oxybutynin (DITROPAN-XL) 10 MG 24 hr tablet Take 1 tablet (10 mg total) by mouth daily. Patient not taking: Reported on 09/10/2022 06/21/22   Alfredo Martinez, MD  potassium chloride SA (KLOR-CON M) 20 MEQ tablet Take 2 tablets (40 mEq total) by mouth daily. Patient not taking: Reported on 09/10/2022 05/20/22   End, Cristal Deer, MD  Scheduled Meds:  atorvastatin  80 mg Oral Daily   Chlorhexidine Gluconate Cloth  6 each Topical Daily   ezetimibe  10 mg Oral Daily   [START ON 09/12/2022] feeding supplement  237 mL Oral BID BM   gabapentin  300 mg Oral BID   midodrine  10 mg Oral TID WC   multivitamin with minerals  1 tablet Oral Daily   warfarin  5 mg Oral ONCE-1600   Warfarin - Pharmacist Dosing Inpatient   Does not apply q1600   Continuous Infusions:  sodium chloride Stopped (09/11/22 0721)   heparin 1,000 Units/hr (09/11/22 1319)   potassium PHOSPHATE IVPB (in mmol)     PRN Meds:.acetaminophen **OR** acetaminophen, ondansetron **OR** ondansetron (ZOFRAN) IV, oxyCODONE, senna-docusate   Active Hospital Problem list     Assessment & Plan:  #Acute on Chronic Respiratory Failure #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  Now presenting with hemoptysis and shortness of breath. Repeat CT doesn't show any dense consolidations to suggest superimposed bacterial pneumonia.  However, there progressive lung cancer with large multi chelated right pleural effusion which appears to have increase in size with significant compressive atelectasis in the right middle and lower  lobes and additional new and enlarging left lung pulmonary nodules consistent with progressive metastatic disease.  -Supplemental O2 as needed to maintain O2 saturations 88 to 92% -Follow intermittent ABG and chest x-ray as needed -As needed bronchodilators -IR consult for thoracentesis, Last thoracentesis on 12/28 yielded 1.4 L -Palliative care consult  # Hemoptysis in the setting of supra therapeutic INR INR on admit>10 S/p Kcentral and Vitamin K Current INR -Hold anticoagulation for now pending possible thoracentesis as above -Follow PT/INR  Hypotension concern for Sepsis -F/u cultures, trend lactic/ PCT -Monitor WBC/ fever curve -Hold empiric IV antibiotics for now  -Pressors for MAP goal >65, has not required pressors -Strict I/O's   # AKI likely ATN in the setting of above # Hypokalemia -Monitor I&O's / urinary output -Follow BMP -Ensure adequate renal perfusion -Avoid nephrotoxic agents as able -Replace electrolytes as indicated   #Chronic HFrEF (LVEF 25-30% with global hypokinesis ) #Hypertension #Atrial Fibrillation Hx: CAD s/p LHC (2016), STEMI, Ischemic Cardiomyopathy, HLD  -Continuous cardiac monitoring -Maintain MAP greater than 65 -IV Lasix as blood pressure and renal function permits; currently on Lasix 40 mg BID -Hold metoprolol, Entresto, and spironolactone for now in the setting of hypotension and AKI -Hold Coumadin due to supratherapeutic INR -Continue aspirin 81 mg and atorvastatin 80 mg/day -Consider cardiology consult -Repeat 2D Echocardiogram     Best practice:  Diet:  Oral Pain/Anxiety/Delirium protocol (if indicated): No VAP protocol (if indicated): Not indicated DVT prophylaxis: Contraindicated GI prophylaxis: PPI Glucose control:  SSI No Central venous access:  N/A Arterial line:  N/A Foley:  N/A Mobility:  bed rest  PT consulted: N/A Last date of  multidisciplinary goals of care discussion [1/12] Code Status:  full  code Disposition: Stepdown   = Goals of Care = Code Status Order: FULL  Primary Emergency Contact: Gotwalt,Tosin, Home Phone: [phone] Wishes to pursue full aggressive treatment and intervention options, including CPR and intubation, but goals of care will be addressed on going with family if that should become necessary.   Critical care provider statement:   Total critical care time: 33 minutes   Performed by: Karna Christmas MD   Critical care time was exclusive of separately billable procedures and treating other patients.   Critical care was necessary to treat or prevent imminent or life-threatening deterioration.   Critical care was time spent personally by me on the following activities: development of treatment plan with patient and/or surrogate as well as nursing, discussions with consultants, evaluation of patient's response to treatment, examination of patient, obtaining history from patient or surrogate, ordering and performing treatments and interventions, ordering and review of laboratory studies, ordering and review of radiographic studies, pulse oximetry and re-evaluation of patient's condition.    Vida Rigger, M.D.  Pulmonary & Critical Care Medicine

## 2022-09-11 NOTE — Progress Notes (Signed)
Spoke to RN regarding order for USPIV for vasopressor. Pt. Has a R PAC, to access for vaso pressor. RN to place a consult if USPIV still needed.

## 2022-09-11 NOTE — Progress Notes (Addendum)
PROGRESS NOTE   Vanessa Carlson  GNF:[id number]    DOB: 1961-03-31    DOA: 09/10/2022  PCP: Virginia Crews, MD   I have briefly reviewed patients previous medical records in [facility].  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  In ED, INR >10, hemodynamically stable and not hypoxic.  CT C/A/P without contrast showing progressive RUL mass/lung cancer, large multiloculated right pleural effusion with compressive atelectasis, progressive metastatic lung disease and possible liver mets.  She was given a dose of Kcentra and vitamin K 10 mg on 1/12 and admitted to ICU.  PCCM was consulted.  No further hemoptysis.  INR down to 1.3.  Initiating IV heparin with close monitoring due to mechanical MVR.  Cardiology consulted.   Assessment & Plan:  Principal Problem:   Supratherapeutic INR Active Problems:   HTN (hypertension)   Coronary artery disease involving native coronary artery of native heart without angina pectoris   Paroxysmal atrial fibrillation (HCC)   Chronic anticoagulation   Morbid obesity (HCC)   Hyperlipidemia LDL goal <70   Primary lung adenocarcinoma, right (HCC)   Pleural effusion   Hypotension   AKI (acute kidney injury) ([place])   Elevated bilirubin   Hemoptysis/supratherapeutic INR: Secondary to supratherapeutic INR in the context of progressive stage IV lung cancer.  S/p Kcentra and IV vitamin K 10 mg on 1/12.  INR down  from >10-1.3.  Reports only a single episode of handful of hemoptysis on 1/11 and none before that or after that.  She is on anticoagulation for mechanical MVR and A-fib.  She remains at high risk for recurrent hemoptysis from her underlying lung cancer.  At the same time, also at high risk for clotting of her mechanical valve.  This was discussed in detail with patient, risks and benefits of anticoagulation including catastrophic bleeding risks and even death versus no anticoagulation and severe thromboembolic complications.  She agrees to initiating of IV heparin.  Monitor closely and then could consider restarting Coumadin if no further bleeding after 24 hours.  Discussed with pulmonology, agree with above plan.  Cardiology has been consulted.  Based on chart review, it appears that palliative care medicine is already on board as outpatient.  Hemoglobin stable.  Acute kidney injury: Serum creatinine 0.94 on 12/1.  Since then has progressively increased, 1.81 on admission and now up to 2.16.  No recent contrast for CT.  May be related to hypotension/ATN.  Avoid nephrotoxic's.  Bladder scan.  Continue to trend daily BMPs.  Hypokalemia:  Replace and follow.  Magnesium 2.5.  Hypophosphatemia Phosphorus 1.9.  Recheck today and replace as needed.  Hypotension: Unclear etiology.?  Poor oral intake and meds, now on hold (Entresto, metoprolol and spironolactone).  S/p 1 L IV fluid bolus.  Blood pressures have improved to low 846N systolic.  Midodrine 10 Mg 3 times daily has been initiated.  Given chronic systolic CHF, concern for CHF decompensation and hence no further IV fluids.  TTE on 01/28/2022 showed LVEF of 25-30%.  Large  multiloculated right pleural effusion: Last had thoracentesis on 12/28.  Patient has baseline chronic DOE and sometimes at rest but states that this has not changed recently.  Consider IR consultation for thoracentesis, inpatient versus outpatient since she is not overtly symptomatic  or hypoxic. As per Dr. Lanney Gins, he has d/w IR who will tap this today.  Hyperlipidemia: Continue atorvastatin  Paroxysmal atrial fibrillation Currently appears to be in sinus rhythm.  Anticoagulation discussion as above.  High risk anticoagulation candidate.  Metoprolol on hold due to hypotension, resume when able.  Essential hypertension: Hypotensive on admission.  Holding Entresto, metoprolol and spironolactone.  Chronic systolic CHF/cardiomyopathy: Clinically euvolemic.  TTE : LVEF 20-25%, unchanged from prior.  Lasix on hold.  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  Cardiology consulted to opine regarding anticoagulation.  Anticoagulation discussion as noted above.  Stage IV adenocarcinoma of lung with malignant right pleural effusion, pulmonary metastasis and possible liver mets. Outpatient follow-up with Dr. Janese Banks, oncology  Chronic low back pain Controlled on oxycodone.  Body mass index is 37.3 kg/m./Obesity    DVT prophylaxis: Place TED hose Start: 09/10/22 1856     Code Status: Full Code:  Family Communication: None at bedside Disposition:  Status is: Inpatient Remains inpatient appropriate because: Concern for recurrent hemoptysis while IV heparin is being initiated.     Consultants:   Pulmonology Cardiology  Procedures:     Antimicrobials:      Subjective:  Seen this morning while still in ICU.  Reports a single episode of hemoptysis as noted above on 1/11 and none before or months since.  Has chronic dyspnea on exertion and at times on rest which has not changed.  No chest pain.  Chronic low back pain with minimal relief with Tylenol but oxycodone works.  Objective:   Vitals:   09/11/22 0600 09/11/22 0604 09/11/22 0650 09/11/22 0700  BP:  (!) 94/57 107/62 106/62  Pulse: 84 84  91  Resp: (!) 25 (!) 22 19 (!) 31  Temp:      TempSrc:      SpO2: 100% 100%  100%  Weight:      Height:         General exam: Young female, moderately built and obese lying comfortably propped up in bed without distress. Respiratory system: Diminished breath sounds bilaterally but more so in the right base.  No wheezing, rhonchi or crackles.  No increased work of breathing. Cardiovascular system: S1 & S2 heard, RRR. No JVD, murmurs, rubs, gallops or clicks. No pedal edema.  Telemetry personally reviewed: Sinus rhythm. Gastrointestinal system: Abdomen is nondistended, soft and nontender. No organomegaly or masses felt. Normal bowel sounds heard. Central nervous system: Alert and oriented. No focal neurological deficits. Extremities: Symmetric 5 x 5 power. Skin: No rashes, lesions or ulcers Psychiatry: Judgement and insight appear normal. Mood & affect appropriate.     Data Reviewed:   I have personally reviewed following labs and imaging studies   CBC: Recent Labs  Lab 09/10/22 1329 09/10/22 1720 09/10/22 1959 09/11/22 0444  WBC 10.6* 10.2  --  11.4*  NEUTROABS 7.8*  --   --   --   HGB 13.7 13.8 13.4 12.9  HCT 43.1 44.1 43.3 41.2  MCV 90.0 89.8  --  91.8  PLT 246 229  --  161    Basic Metabolic Panel: Recent Labs  Lab 09/10/22 1329 09/10/22 1720 09/10/22 1857 09/11/22 0444  NA 137 137  --  136  K 3.4* 3.5  --  3.3*  CL 97* 98  --  100  CO2 27 27  --  26  GLUCOSE 132* 119*  --  109*  BUN 20 20  --  23  CREATININE 1.81* 1.97*  --  2.16*  CALCIUM 9.1 9.3  --  8.6*  MG  --   --  2.5*  --   PHOS  --   --  1.9*  --     Liver Function Tests: Recent Labs  Lab 09/10/22 1329 09/10/22 1720  AST 31 33  ALT 17 17  ALKPHOS 193* 203*  BILITOT 1.8* 2.3*  PROT 8.1 8.5*  ALBUMIN 4.0 4.2    CBG: No results for input(s): "GLUCAP" in the last 168 hours.  Microbiology Studies:   Recent Results (from the past 240 hour(s))  MRSA Next Gen by PCR, Nasal     Status: None   Collection Time: 09/11/22  3:24 AM   Specimen: Nasal Mucosa; Nasal Swab  Result Value Ref Range Status    MRSA by PCR Next Gen NOT DETECTED NOT DETECTED Final    Comment: (NOTE) The GeneXpert MRSA Assay (FDA approved for NASAL specimens only), is one component of a comprehensive MRSA colonization surveillance program. It is not intended to diagnose MRSA infection nor to guide or monitor treatment for MRSA infections. Test performance is not FDA approved in patients less than 45 years old. Performed at [facility], [address] [place], [place] [address]     Radiology Studies:  CT CHEST ABDOMEN PELVIS WO CONTRAST  Result Date: 09/10/2022 CLINICAL DATA:  Non-small cell lung cancer, elevated INR EXAM: CT CHEST, ABDOMEN AND PELVIS WITHOUT CONTRAST TECHNIQUE: Multidetector CT imaging of the chest, abdomen and pelvis was performed following the standard protocol without IV contrast. Examination was performed without intravenous contrast at the request of the ordering physician. Evaluation of the soft tissues, vascular structures, and solid viscera is limited. RADIATION DOSE REDUCTION: This exam was performed according to the departmental dose-optimization program which includes automated exposure control, adjustment of the mA and/or kV according to patient size and/or use of iterative reconstruction technique. COMPARISON:  05/21/2022, 06/10/2022 FINDINGS: CT CHEST FINDINGS Cardiovascular: Stable cardiac pacer. Mitral valve prosthesis. No pericardial effusion. Normal caliber of the thoracic aorta. Stable atherosclerosis of the aorta and coronary vasculature. Mediastinum/Nodes: Thyroid, trachea, and esophagus are grossly unremarkable. 9 mm short axis pretracheal lymph node reference image 17/2, previously measuring approximately 12 mm. Evaluation of the mediastinum and hilar regions is limited due to the lack of intravenous contrast. Lungs/Pleura: Right upper lobe mass measures 6.9 x 5.0 cm reference image 15/2, previously measuring 6.4 x 4.6 cm, compatible with recurrent right upper lobe lung  cancer. There is a large multiloculated right pleural effusion. Compressive atelectasis is seen within the right middle and right lower lobes, with minimal aeration superior segment right lower lobe. There is increase in the size and number of left lung pulmonary nodules seen on prior study, consistent with progressive metastatic disease. Index nodule left upper lobe image 34/4 measures 12 x 8 mm, previously 6 x 5 mm. No left-sided effusion. No pneumothorax. Musculoskeletal: Stable right anterolateral second and third rib fractures. No new bony abnormalities. Reconstructed images demonstrate no additional findings. CT ABDOMEN PELVIS FINDINGS Hepatobiliary: Gallbladder sludge layering dependently within the gallbladder. No calcified gallstones or cholecystitis. Indeterminate 8 mm hypodensity inferior right lobe liver image 59/2, metastatic disease cannot be excluded. No biliary duct dilation. Pancreas: Unremarkable unenhanced  appearance. Spleen: Unremarkable unenhanced appearance. Adrenals/Urinary Tract: No urinary tract calculi or obstructive uropathy. The adrenals and bladder are grossly unremarkable. Stomach/Bowel: No bowel obstruction or ileus. No bowel wall thickening or inflammatory change. Vascular/Lymphatic: Aortic atherosclerosis. No enlarged abdominal or pelvic lymph nodes. Reproductive: Prior hysterectomy.  No adnexal masses. Other: No free fluid or free intraperitoneal gas. No abdominal wall hernia. Musculoskeletal: No acute or destructive bony lesions. Reconstructed images demonstrate no additional findings. IMPRESSION: 1. Enlarging right upper lobe mass consistent with progressive lung cancer. 2. Large multiloculated right pleural effusion, increased in size since prior study. Significant compressive atelectasis within the right middle and right lower lobe, with only a small portion of normally aerated lung within the superior segment right lower lobe. 3. New and enlarging left lung pulmonary nodules  consistent with progressive metastatic disease. 4. Indeterminate 8 mm hypodensity inferior right lobe liver, not well seen on previous exams. Metastatic disease cannot be excluded. 5. Grossly stable borderline enlarged mediastinal adenopathy. 6. Gallbladder sludge. No evidence of cholelithiasis or cholecystitis. 7.  Aortic Atherosclerosis (ICD10-I70.0). Electronically Signed   By: Sharlet Salina M.D.   On: 09/10/2022 17:50    Scheduled Meds:    Chlorhexidine Gluconate Cloth  6 each Topical Daily   midodrine  10 mg Oral TID WC   potassium chloride  40 mEq Oral Once    Continuous Infusions:    sodium chloride Stopped (09/11/22 0721)   norepinephrine (LEVOPHED) Adult infusion Stopped (09/11/22 0721)     LOS: 1 day     Marcellus Scott, MD,  FACP, FHM, [facility], [facility], [facility] - [facility]   [facility] [place]     To contact the attending provider between 7A-7P or the covering provider during after hours 7P-7A, please log into the web site [email or web address] and access using universal [place] password for that web site. If you do not have the password, please call the hospital operator.  09/11/2022, 9:23 AM

## 2022-09-11 NOTE — Progress Notes (Deleted)
BRIEF HPI: 62 year old female with a past medical history significant for CAD, ischemic cardiomyopathy status post AICD placement, rheumatic heart disease status post mechanical mitral valve replacement on Coumadin, primary lung adenocarcinoma, right treated with chemo who presented to the ED with hemoptysis and supra therapeutic INR. Patient received Kcentra and Vitamin K. She was noted to be hypotensive and treated with 1 L bolus. Patient remained hypotensive  therefore PCCM consulted for possible pressor requirement.  Patient evaluated at the bedside, she was awake, alert and oriented x 4 in no acute distress, she was able to ambulate to the bedside commode without any difficulty. BP stable not requiring any pressors after cuff adjustment.    PLAN -Continue current treatment, patient does not have any critical needs at this time, hemodynamically stable with minimal O2 requirement. INR normalized. -Consider IR consult if with worsening Pleural Effusion otherwise appears stable -Please re-consult with any critical care needs.    Webb Silversmith, DNP, CCRN, FNP-C, AGACNP-BC Acute Care & Family Nurse Practitioner  [facility]  See Amion for personal pager PCCM on call pager [phone] until 7 am

## 2022-09-11 NOTE — Progress Notes (Signed)
eLink Physician-Brief Progress Note Patient Name: Vanessa Carlson DOB: 12-26-1960 MRN: [id number]   Date of Service  09/11/2022  HPI/Events of Note  62 year old female with stage IV lung adenocarcinoma on the right side, hypertension, hyperlipidemia, paroxysmal atrial fibrillation, on chronic anticoagulation with warfarin, prior MVR who presents emergency department from oncology office with elevated INR.   Stable from hemodynamic and resp standpoint.  CT chest, abd, and pelvis completed.  Enlarging mass and multiloculated effusion noted.  eICU Interventions  Chart reviewed     Intervention Category Evaluation Type: New Patient Evaluation  Henry Russel, P 09/11/2022, 5:10 AM

## 2022-09-11 NOTE — Consult Note (Addendum)
NAME:  Vanessa Carlson, MRN:  [id number], DOB:  Apr 01, 1961, LOS: 1 ADMISSION DATE:  09/10/2022, CONSULTATION DATE:  09/10/22 REFERRING MD:  Cox Amy, CHIEF COMPLAINT:  Hemoptysis    HPI  62 y.o. 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   ED Course: Initial vital signs showed HR of 109 beats/minute, BP 118/72 mm Hg, the RR 17 breaths/minute, and the oxygen saturation 96 % on and a temperature of 98.44F (37.0C).    Pertinent Labs/Diagnostics Findings: Chemistry:Na+/ K+: 137/3.5 Glucose:119 BUN/Cr.:20/1.97 CBC: Unremarkable Other Lab findings: PT/INR: 84.1/>10.0  P Imaging:CT Chest Abd/pelvis>see below   Significant Hospital Events   1/12: Admit to stepdown with Hemoptysis and supratherapeutic INR, developed hypotension. PCCM consulted for possible pressor requirement  Consults:  PCCM  Procedures:  None  Significant Diagnostic Tests:  1/12: CT Chest/Abd/Pelvis>  IMPRESSION: 1. Enlarging right upper lobe mass consistent with progressive lung cancer. 2. Large multiloculated right pleural effusion, increased in size since prior study. Significant compressive atelectasis within the right middle and right lower lobe, with only a small portion of normally aerated lung within the superior segment right lower lobe. 3. New and enlarging left lung pulmonary nodules consistent with progressive metastatic disease. 4. Indeterminate 8 mm hypodensity inferior right lobe liver, not well seen on previous exams. Metastatic disease cannot be excluded. 5. Grossly stable borderline enlarged  mediastinal adenopathy. 6. Gallbladder sludge. No evidence of cholelithiasis or cholecystitis. 7.  Aortic Atherosclerosis (ICD10-I70.0).  Micro Data:  None  Antimicrobials:  None  OBJECTIVE  Blood pressure 92/65, pulse 87, temperature 97.6 F (36.4 C), temperature source Oral, resp. rate 15, height 5\' 2"  (1.575 m), weight 92.5 kg, SpO2 100 %.       No intake or output data in the 24 hours ending 09/11/22 0356 Filed Weights   09/11/22 0332  Weight: 92.5 kg   Physical Examination  GENERAL:62  year-old critically ill patient lying in the bed with no acute distress.  EYES: Pupils equal, round, reactive to light and accommodation. No scleral icterus. Extraocular muscles intact.  HEENT: Head atraumatic, normocephalic. Oropharynx and nasopharynx clear.  NECK:  Supple, no jugular venous distention. No thyroid enlargement, no tenderness.  LUNGS: Normal breath sounds bilaterally, no wheezing, rales,rhonchi or crepitation. No use of accessory muscles of respiration.  CARDIOVASCULAR: S1, S2 normal. No murmurs, rubs, or gallops.  ABDOMEN: Soft, nontender, nondistended. Bowel sounds present. No organomegaly or mass.  EXTREMITIES: Upper and lower extremities are atraumatic. No swelling or erythema. Muscle strength is 5/5 bilaterally. Capillary refill> 3 seconds in all extremities. Pulses palpable distally. NEUROLOGIC:The patient is awake, alert and oriented  x 3 with normal speech. Sensation is intact bilaterally. Cranial nerves are intact.  Gait not checked.  PSYCHIATRIC: Appropriate mood and affect.  SKIN: No obvious rash, lesion, or ulcer.   Labs/imaging that I havepersonally reviewed  (right click and "Reselect all SmartList Selections" daily)     Labs   CBC: Recent Labs  Lab 09/10/22 1329 09/10/22 1720 09/10/22 1959  WBC 10.6* 10.2  --   NEUTROABS 7.8*  --   --   HGB 13.7 13.8 13.4  HCT 43.1 44.1 43.3  MCV 90.0 89.8  --   PLT 246 229  --     Basic Metabolic Panel: Recent  Labs  Lab 09/10/22 1329 09/10/22 1720 09/10/22 1857  NA 137 137  --   K 3.4* 3.5  --   CL 97* 98  --   CO2 27 27  --   GLUCOSE 132* 119*  --   BUN 20 20  --   CREATININE 1.81* 1.97*  --   CALCIUM 9.1 9.3  --   MG  --   --  2.5*  PHOS  --   --  1.9*   GFR: Estimated Creatinine Clearance: 31.4 mL/min (A) (by C-G formula based on SCr of 1.97 mg/dL (H)). Recent Labs  Lab 09/10/22 1329 09/10/22 1720  WBC 10.6* 10.2    Liver Function Tests: Recent Labs  Lab 09/10/22 1329 09/10/22 1720  AST 31 33  ALT 17 17  ALKPHOS 193* 203*  BILITOT 1.8* 2.3*  PROT 8.1 8.5*  ALBUMIN 4.0 4.2   No results for input(s): "LIPASE", "AMYLASE" in the last 168 hours. No results for input(s): "AMMONIA" in the last 168 hours.  ABG No results found for: "PHART", "PCO2ART", "PO2ART", "HCO3", "TCO2", "ACIDBASEDEF", "O2SAT"   Coagulation Profile: Recent Labs  Lab 09/10/22 1329 09/10/22 1720 09/10/22 2152  INR 10.1* >10.0* 1.4*    Cardiac Enzymes: No results for input(s): "CKTOTAL", "CKMB", "CKMBINDEX", "TROPONINI" in the last 168 hours.  HbA1C: Hgb A1c MFr Bld  Date/Time Value Ref Range Status  08/20/2022 01:50 PM 6.2 (H) 4.8 - 5.6 % Final    Comment:    (NOTE)         Prediabetes: 5.7 - 6.4         Diabetes: >6.4         Glycemic control for adults with diabetes: <7.0   12/14/2019 09:09 AM 5.9 (H) 4.8 - 5.6 % Final    Comment:             Prediabetes: 5.7 - 6.4          Diabetes: >6.4          Glycemic control for adults with diabetes: <7.0     CBG: No results for input(s): "GLUCAP" in the last 168 hours.  Review of Systems:   Review of Systems  Constitutional:  Positive for malaise/fatigue and weight loss. Negative for chills, diaphoresis and fever.  HENT: Negative.    Respiratory:  Positive for hemoptysis and shortness of breath.   Gastrointestinal: Negative.   Genitourinary: Negative.   Musculoskeletal: Negative.   Skin: Negative.   Neurological: Negative.    Endo/Heme/Allergies:  Bruises/bleeds easily.  Psychiatric/Behavioral: Negative.     Past Medical History  She,  has a past medical history of AICD (automatic cardioverter/defibrillator) present, Anterior myocardial infarction [facility]), Arthritis, Cancer of upper lobe of right lung (HCC) (09/2017), Cervical cancer (HCC), Chronic combined systolic and diastolic CHF (congestive heart failure) (HCC), Coronary artery disease, Essential hypertension, Ischemic cardiomyopathy, Mass of upper lobe of right lung, Mitral valve disease, and Personal history of radiation therapy.   Surgical History    Past Surgical History:  Procedure Laterality Date   APPENDECTOMY  1998   BREAST EXCISIONAL BIOPSY Left 80s   benign   CARDIAC CATHETERIZATION     CARDIAC DEFIBRILLATOR PLACEMENT     COLONOSCOPY WITH PROPOFOL N/A 01/11/2020   Procedure: COLONOSCOPY WITH PROPOFOL;  Surgeon: Wyline Mood, MD;  Location: [facility] ENDOSCOPY;  Service: Gastroenterology;  Laterality: N/A;   IR  IMAGING GUIDED PORT INSERTION  06/29/2022   PORTA CATH INSERTION     RADICAL HYSTERECTOMY  1998   REPLACEMENT TOTAL KNEE Right 12/10/2021   SHOULDER SURGERY Bilateral    rotator cuff tears   VALVE REPLACEMENT     Mitral valve; 27 mm [employer] bileaflet valve     Social History   reports that she quit smoking about 26 years ago. Her smoking use included cigarettes. She has a 4.00 pack-year smoking history. She has been exposed to tobacco smoke. She has never used smokeless tobacco. She reports that she does not drink alcohol and does not use drugs.   Family History   Her family history includes Breast cancer in her maternal aunt and maternal aunt; Colon cancer in her maternal uncle; Diabetes in her maternal grandmother and mother; Emphysema in her father; Heart attack in her mother; Hypertension in her mother; Kidney disease in her maternal grandmother.   Allergies No Known Allergies   Home Medications  Prior to Admission medications    Medication Sig Start Date End Date Taking? Authorizing Provider  aspirin 81 MG chewable tablet Chew 81 mg by mouth daily.   Yes [provider]  atorvastatin (LIPITOR) 80 MG tablet TAKE 1 TABLET BY MOUTH ONCE DAILY. OFFICE VISIT AND LABS NEEDED FOR FURTHER REFILLS. 08/31/22  Yes Bacigalupo, Marzella Schlein, MD  ENTRESTO 24-26 MG TAKE 1 TABLET BY MOUTH TWICE DAILY *SCHEDULE OFFICE VISIT FOR FURTHER REFILLS* 08/02/22  Yes End, Cristal Deer, MD  ezetimibe (ZETIA) 10 MG tablet Take 1 tablet (10 mg total) by mouth daily. 07/12/22  Yes End, Cristal Deer, MD  furosemide (LASIX) 40 MG tablet Take 2 tablets (80 mg total) by mouth 2 (two) times daily. 01/21/22  Yes End, Cristal Deer, MD  gabapentin (NEURONTIN) 300 MG capsule Take 1 capsule (300 mg total) by mouth 2 (two) times daily. 10/26/21  Yes Bacigalupo, Marzella Schlein, MD  lidocaine-prilocaine (EMLA) cream Apply to affected area once 06/25/22  Yes Creig Hines, MD  metoprolol succinate (TOPROL-XL) 25 MG 24 hr tablet TAKE 1 TABLET BY MOUTH TWICE DAILY IN THE MORNING AND AT BEDTIME 06/21/22  Yes End, Cristal Deer, MD  ondansetron (ZOFRAN) 8 MG tablet Take 1 tablet (8 mg total) by mouth every 8 (eight) hours as needed for nausea or vomiting. 06/25/22  Yes Creig Hines, MD  oxyCODONE (OXY IR/ROXICODONE) 5 MG immediate release tablet Take 1 tablet (5 mg total) by mouth every 4 (four) hours as needed for severe pain. 09/10/22  Yes Creig Hines, MD  prochlorperazine (COMPAZINE) 10 MG tablet Take 1 tablet (10 mg total) by mouth every 6 (six) hours as needed for nausea or vomiting. 06/25/22  Yes Creig Hines, MD  spironolactone (ALDACTONE) 25 MG tablet Take 1 tablet (25 mg total) by mouth daily. 04/27/22  Yes Caro Laroche, DO  warfarin (COUMADIN) 5 MG tablet 5 mg daily except 7.5 mg on Mon and Fri Patient taking differently: Take 5 mg by mouth every evening. 05/14/22  Yes Drubel, Lillia Abed, PA-C  metolazone (ZAROXOLYN) 2.5 MG tablet Take 1 tablet (2.5 mg) by mouth  once a week. Take 30 minutes prior to your furosemide dose. Patient not taking: Reported on 09/10/2022 05/20/22   End, Cristal Deer, MD  oxybutynin (DITROPAN-XL) 10 MG 24 hr tablet Take 1 tablet (10 mg total) by mouth daily. Patient not taking: Reported on 09/10/2022 06/21/22   Alfredo Martinez, MD  potassium chloride SA (KLOR-CON M) 20 MEQ tablet Take 2 tablets (40 mEq total) by  mouth daily. 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  Now presenting with hemoptysis and shortness of breath. Repeat CT doesn't show any dense consolidations to suggest superimposed bacterial pneumonia.  However, there progressive lung cancer with large multi chelated right pleural effusion which appears to have increase in size with significant compressive atelectasis in the right middle and lower lobes and additional new and enlarging left lung pulmonary nodules consistent with progressive metastatic disease.  -Supplemental O2 as needed to maintain O2 saturations 88 to 92% -Follow intermittent ABG and chest x-ray as needed -As needed bronchodilators -IR consult for thoracentesis, Last thoracentesis on 12/28 yielded 1.4 L -Palliative care consult  # Hemoptysis in the setting of supra therapeutic INR INR on admit>10 S/p Kcentral and Vitamin K Current INR -Hold anticoagulation for now pending possible thoracentesis as above -Follow PT/INR  Hypotension concern for Sepsis -F/u cultures, trend lactic/  PCT -Monitor WBC/ fever curve -Hold empiric IV antibiotics for now  -Pressors for MAP goal >65, has not required pressors -Strict I/O's   # AKI likely ATN in the setting of above # Hypokalemia -Monitor I&O's / urinary output -Follow BMP -Ensure adequate renal perfusion -Avoid nephrotoxic agents as able -Replace electrolytes as indicated   #Chronic HFrEF (LVEF 25-30% with global hypokinesis ) #Hypertension #Atrial Fibrillation Hx: CAD s/p LHC (2016), STEMI, Ischemic Cardiomyopathy, HLD  -Continuous cardiac monitoring -Maintain MAP greater than 65 -IV Lasix as blood pressure and renal function permits; currently on Lasix 40 mg BID -Hold metoprolol, Entresto, and spironolactone for now in the setting of hypotension and AKI -Hold Coumadin due to supratherapeutic INR -Continue aspirin 81 mg and atorvastatin 80 mg/day -Consider cardiology consult -Repeat 2D Echocardiogram     Best practice:  Diet:  Oral Pain/Anxiety/Delirium protocol (if indicated): No VAP protocol (if indicated): Not indicated DVT prophylaxis: Contraindicated GI prophylaxis: PPI Glucose control:  SSI No Central venous access:  N/A Arterial line:  N/A Foley:  N/A Mobility:  bed rest  PT consulted: N/A Last date of multidisciplinary goals of care discussion [1/12] Code Status:  full code Disposition: Stepdown   = Goals of Care = Code Status Order: FULL  Primary Emergency Contact: Montilla,Tosin, Home Phone: [phone] Wishes to pursue full aggressive treatment and intervention options, including CPR and intubation, but goals of care will be addressed on going with family if that should become necessary.   Critical care time: 45 minutes       Webb Silversmith DNP, CCRN, FNP-C, AGACNP-BC Acute Care NP [facility] PCCM on call pager [phone]

## 2022-09-11 NOTE — Consult Note (Signed)
Cardiology Consultation   Patient ID: Vanessa Carlson MRN: [id number]; DOB: Apr 28, 1961  Admit date: 09/10/2022 Date of Consult: 09/11/2022  PCP:  Erasmo Downer, MD   [facility] Providers Cardiologist:  Yvonne Kendall, MD   {  Patient Profile:   Vanessa Carlson is a 62 y.o. 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 Waymon Amato.  History of Present Illness:   Vanessa Carlson is followed by Dr. Okey Dupre for the above cardiac history.   Underwent mitral valve replacement 2006 at [facility] with a 27 mm Saint Jude bileaflet valve.  Patient has a history of anterior STEMI in 2016.  Reportedly proximal occlusion of the LAD, which would not be crossed with a wire.  He has known ischemic cardiomyopathy with chronic systolic heart failure  Patient received single-chamber ICD in August 2016.  Echo in 2017 showed LVEF 2025% with severe diastolic dysfunction, mildly reduced RV contraction, normal MV prosthesis function.  Echo in 2019 showed mildly dilated LV with LVEF of 20 to 25%, mechanical mitral valve with a mean gradient of 4 mmHg, moderate TR.  From 2023 showed LVEF 25 to 30%, global hypokinesis, mechanical mitral valve without regurgitation.  Heart monitor September 2023 showed predominantly sinus rhythm with rare PACs and PVCs, brief episode of PSVT, no sustained arrhythmia.  Patient was last seen November 2023 reporting worsening shortness of breath.  She was using metolazone, however had recently run out.  Patient appears euvolemic on exam.  It was suspected right pleural effusion was driven primarily by her lung cancer and not heart failure.  No changes were made.  The patient was referred to the ER from  her primary care for concerns of mopped assist and elevated INR.  INR was found to be greater than 10.  In the ER vitals were stable, other than a heart rate of 102.  Labs showed potassium 3.5, quad 98, bicarb 27, serum creatinine 1.97, BUN 20, WBC 10.2, hemoglobin 13.8, platelets 229.  CT chest/abd showed enlarging right upper lobe consistent with progressive lung cancer, large multi loculated right pleural effusion, new and enlarging left lung pulmonary nodules consistent with progressive metastatic disease.  No signs of bleeding noted.  Anticoagulation was held in she was given Kcentra and vitamin K.  She was admitted for further workup.  Developed hypotension on 09/10/2022 requiring possible pressor support-critical care was consulted.INR down to 1.3 and cardiology was consulted for anticoagulation recommendations.    Past Medical History:  Diagnosis Date   AICD (automatic cardioverter/defibrillator) present    Anterior myocardial infarction [facility])    a. 10/2014 - occluded LAD, complicated by cardiogenic shock-->Med Rx as interventional team was unable to open LAD.   Arthritis    arthirits of knee on right   Cancer of upper lobe of right lung (HCC) 09/2017   radiation therapy right side   Cervical cancer (HCC)    in remission for ~20 yrs, s/p hysterectomy   Chronic combined systolic and diastolic CHF (congestive heart failure) (HCC)    a. 10/2015 Echo: EF 20-25%, sev diast dysfxn, mildly reduced RV fxn. Nl MV prosthesis; b. 12/2017 Echo: EF 20-25%, diff HK. Sev ant/antsept HK, apical AK. Mild MS (mean graad ). Mod TR. PASP .  Coronary artery disease    a. 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   Essential hypertension    Ischemic cardiomyopathy    a. 03/2015 s/p MDT single lead AICD;  b. 10/2015 Echo: EF 20-25%;  c. 12/2017 Echo: EF 20-25%, diff HK.   Mass of upper lobe of right lung    a. noted  on CXR & CT 08/2017 w/ abnl PET CT.   Mitral valve disease    a. 2006 s/p MVR w/ 4mm SJM bileaflet mechanical valve-->chronic coumadin/ASA; b. 12/2017 Echo: Mild MS. Mean gradient .   Personal history of radiation therapy    f/u lung ca    Past Surgical History:  Procedure Laterality Date   APPENDECTOMY  1998   BREAST EXCISIONAL BIOPSY Left 80s   benign   CARDIAC CATHETERIZATION     CARDIAC DEFIBRILLATOR PLACEMENT     COLONOSCOPY WITH PROPOFOL N/A 01/11/2020   Procedure: COLONOSCOPY WITH PROPOFOL;  Surgeon: Wyline Mood, MD;  Location: [facility] ENDOSCOPY;  Service: Gastroenterology;  Laterality: N/A;   IR IMAGING GUIDED PORT INSERTION  06/29/2022   PORTA CATH INSERTION     RADICAL HYSTERECTOMY  1998   REPLACEMENT TOTAL KNEE Right 12/10/2021   SHOULDER SURGERY Bilateral    rotator cuff tears   VALVE REPLACEMENT     Mitral valve; 27 mm St. Jude bileaflet valve     Home Medications:  Prior to Admission medications   Medication Sig Start Date End Date Taking? Authorizing Provider  aspirin 81 MG chewable tablet Chew 81 mg by mouth daily.   Yes [provider]  atorvastatin (LIPITOR) 80 MG tablet TAKE 1 TABLET BY MOUTH ONCE DAILY. OFFICE VISIT AND LABS NEEDED FOR FURTHER REFILLS. 08/31/22  Yes Bacigalupo, Marzella Schlein, MD  ENTRESTO 24-26 MG TAKE 1 TABLET BY MOUTH TWICE DAILY *SCHEDULE OFFICE VISIT FOR FURTHER REFILLS* 08/02/22  Yes End, Cristal Deer, MD  ezetimibe (ZETIA) 10 MG tablet Take 1 tablet (10 mg total) by mouth daily. 07/12/22  Yes End, Cristal Deer, MD  furosemide (LASIX) 40 MG tablet Take 2 tablets (80 mg total) by mouth 2 (two) times daily. 01/21/22  Yes End, Cristal Deer, MD  gabapentin (NEURONTIN) 300 MG capsule Take 1 capsule (300 mg total) by mouth 2 (two) times daily. 10/26/21  Yes Bacigalupo, Marzella Schlein, MD  lidocaine-prilocaine (EMLA) cream Apply to affected area once 06/25/22  Yes Creig Hines, MD  metoprolol succinate (TOPROL-XL) 25 MG 24 hr tablet TAKE 1 TABLET BY  MOUTH TWICE DAILY IN THE MORNING AND AT BEDTIME 06/21/22  Yes End, Cristal Deer, MD  ondansetron (ZOFRAN) 8 MG tablet Take 1 tablet (8 mg total) by mouth every 8 (eight) hours as needed for nausea or vomiting. 06/25/22  Yes Creig Hines, MD  oxyCODONE (OXY IR/ROXICODONE) 5 MG immediate release tablet Take 1 tablet (5 mg total) by mouth every 4 (four) hours as needed for severe pain. 09/10/22  Yes Creig Hines, MD  prochlorperazine (COMPAZINE) 10 MG tablet Take 1 tablet (10 mg total) by mouth every 6 (six) hours as needed for nausea or vomiting. 06/25/22  Yes Creig Hines, MD  spironolactone (ALDACTONE) 25 MG tablet Take 1 tablet (25 mg total) by mouth daily. 04/27/22  Yes Caro Laroche, DO  warfarin (COUMADIN) 5 MG tablet 5 mg daily except 7.5 mg on Mon and Fri Patient taking differently: Take 5 mg by mouth every evening. 05/14/22  Yes Alfredia Ferguson, PA-C  metolazone (ZAROXOLYN) 2.5 MG tablet Take 1 tablet (2.5 mg) by mouth once a week. Take 30 minutes prior to your furosemide dose. Patient not taking: Reported on 09/10/2022 05/20/22   End, Cristal Deer, MD  oxybutynin (DITROPAN-XL) 10 MG 24 hr tablet Take 1 tablet (10 mg total) by mouth daily. Patient not taking: Reported on 09/10/2022 06/21/22   Alfredo Martinez, MD  potassium chloride SA (KLOR-CON M) 20 MEQ tablet Take 2 tablets (40 mEq total) by mouth daily. Patient not taking: Reported on 09/10/2022 05/20/22   End, Cristal Deer, MD    Inpatient Medications: Scheduled Meds:  atorvastatin  80 mg Oral Daily   Chlorhexidine Gluconate Cloth  6 each Topical Daily   ezetimibe  10 mg Oral Daily   gabapentin  300 mg Oral BID   midodrine  10 mg Oral TID WC   Continuous Infusions:  sodium chloride Stopped (09/11/22 0721)   PRN Meds: acetaminophen **OR** acetaminophen, ondansetron **OR** ondansetron (ZOFRAN) IV, oxyCODONE, senna-docusate  Allergies:   No Known Allergies  Social History:   Social History   Socioeconomic History   Marital  status: Married    Spouse name: Tosin   Number of children: 2   Years of education: 14   Highest education level: Some college, no degree  Occupational History   Occupation: disable  Tobacco Use   Smoking status: Former    Packs/day: 1.00    Years: 4.00    Total pack years: 4.00    Types: Cigarettes    Quit date: 1998    Years since quitting: 26.0    Passive exposure: Past   Smokeless tobacco: Never  Vaping Use   Vaping Use: Never used  Substance and Sexual Activity   Alcohol use: No   Drug use: No   Sexual activity: Yes    Partners: Male    Birth control/protection: Surgical  Other Topics Concern   Not on file  Social History Narrative   Not on file   Social Determinants of Health   Financial Resource Strain: Low Risk  (10/29/2021)   Overall Financial Resource Strain (CARDIA)    Difficulty of Paying Living Expenses: Not hard at all  Food Insecurity: No Food Insecurity (09/11/2022)   Hunger Vital Sign    Worried About Running Out of Food in the Last Year: Never true    Ran Out of Food in the Last Year: Never true  Transportation Needs: No Transportation Needs (09/11/2022)   PRAPARE - Administrator, Civil Service (Medical): No    Lack of Transportation (Non-Medical): No  Physical Activity: Insufficiently Active (10/29/2021)   Exercise Vital Sign    Days of Exercise per Week: 2 days    Minutes of Exercise per Session: 20 min  Stress: No Stress Concern Present (10/29/2021)   [employer] of Occupational Health - Occupational Stress Questionnaire    Feeling of Stress : Not at all  Social Connections: Moderately Integrated (10/29/2021)   Social Connection and Isolation Panel [NHANES]    Frequency of Communication with Friends and Family: Three times a week    Frequency of Social Gatherings with Friends and Family: Once a week    Attends Religious Services: More than 4 times per year    Active Member of [employer] or Organizations: No    Attends Tax inspector Meetings: Never    Marital Status: Married  Catering manager Violence: Not At Risk (09/11/2022)   Humiliation, Afraid, Rape, and Kick questionnaire    Fear of Current  or Ex-Partner: No    Emotionally Abused: No    Physically Abused: No    Sexually Abused: No    Family History:    Family History  Problem Relation Age of Onset   Heart attack Mother    Hypertension Mother    Diabetes Mother    Emphysema Father        smoker   Diabetes Maternal Grandmother    Kidney disease Maternal Grandmother    Breast cancer Maternal Aunt    Breast cancer Maternal Aunt    Colon cancer Maternal Uncle      ROS:  Please see the history of present illness.   All other ROS reviewed and negative.     Physical Exam/Data:   Vitals:   09/11/22 0600 09/11/22 0604 09/11/22 0650 09/11/22 0700  BP:  (!) 94/57 107/62 106/62  Pulse: 84 84  91  Resp: (!) 25 (!) 22 19 (!) 31  Temp:      TempSrc:      SpO2: 100% 100%  100%  Weight:      Height:        Intake/Output Summary (Last 24 hours) at 09/11/2022 1012 Last data filed at 09/11/2022 0600 Gross per 24 hour  Intake --  Output 80 ml  Net -80 ml      09/11/2022    3:32 AM 09/10/2022    1:58 PM 08/20/2022    2:06 PM  Last 3 Weights  Weight (lbs) 203 lb 14.8 oz 199 lb 208 lb  Weight (kg) 92.5 kg 90.266 kg 94.348 kg     Body mass index is 37.3 kg/m.  General:  Well nourished, well developed, in no acute distress HEENT: normal Neck: no JVD Vascular: No carotid bruits; Distal pulses 2+ bilaterally Cardiac:  normal S1, S2; RRR; no murmur  Lungs:  clear to auscultation bilaterally, no wheezing, rhonchi or rales  Abd: soft, nontender, no hepatomegaly  Ext: no edema Musculoskeletal:  No deformities, BUE and BLE strength normal and equal Skin: warm and dry  Neuro:  CNs 2-12 intact, no focal abnormalities noted Psych:  Normal affect   EKG:  The EKG was personally reviewed and demonstrates:  ST 103bpm, RAD Telemetry:  Telemetry  was personally reviewed and demonstrates:  Nsr HR 80s  Relevant CV Studies:  Heart monitor 05/2022   The patient was monitored for 13 days, 20 hours.   The predominant rhythm was sinus with an average rate of 82 bpm (range 65-129 bpm).   There were rare PACs and PVCs.   A single supraventricular run occurred, lasting 12 beats with a maximum rate of 128 bpm.   No sustained arrhythmia or prolonged pause was identified.   Single patient triggered event corresponds to normal sinus rhythm.   Predominantly sinus rhythm with rare PACs and PVCs as well as a single brief episode of PSVT.  No sustained arrhythmia identified.    Echo 01/2022 1. Left ventricular ejection fraction, by estimation, is 25 to 30%. The  left ventricle has severely decreased function. The left ventricle  demonstrates global hypokinesis. The left ventricular internal cavity size  was mildly dilated. Left ventricular  diastolic parameters are indeterminate. The average left ventricular  global longitudinal strain is -9.0 %. The global longitudinal strain is  abnormal.   2. Right ventricular systolic function is mildly reduced. The right  ventricular size is normal.   3. Left atrial size was mild to moderately dilated.   4. The mitral valve has been repaired/replaced.  No evidence of mitral  valve regurgitation. There is a mechanical valve present in the mitral  position.   5. The aortic valve was not well visualized. Aortic valve regurgitation  is not visualized.   6. The inferior vena cava is normal in size with greater than 50%  respiratory variability, suggesting right atrial pressure of 3 mmHg.   Comparison(s): 12/2017-EF 20%-25%; MVR;.   Laboratory Data:  High Sensitivity Troponin:  No results for input(s): "TROPONINIHS" in the last 720 hours.   Chemistry Recent Labs  Lab 09/10/22 1329 09/10/22 1720 09/10/22 1857 09/11/22 0444  NA 137 137  --  136  K 3.4* 3.5  --  3.3*  CL 97* 98  --  100  CO2 27 27  --   26  GLUCOSE 132* 119*  --  109*  BUN 20 20  --  23  CREATININE 1.81* 1.97*  --  2.16*  CALCIUM 9.1 9.3  --  8.6*  MG  --   --  2.5*  --   GFRNONAA 31* 28*  --  25*  ANIONGAP 13 12  --  10    Recent Labs  Lab 09/10/22 1329 09/10/22 1720  PROT 8.1 8.5*  ALBUMIN 4.0 4.2  AST 31 33  ALT 17 17  ALKPHOS 193* 203*  BILITOT 1.8* 2.3*   Lipids No results for input(s): "CHOL", "TRIG", "HDL", "LABVLDL", "LDLCALC", "CHOLHDL" in the last 168 hours.  Hematology Recent Labs  Lab 09/10/22 1329 09/10/22 1720 09/10/22 1959 09/11/22 0444  WBC 10.6* 10.2  --  11.4*  RBC 4.79 4.91  --  4.49  HGB 13.7 13.8 13.4 12.9  HCT 43.1 44.1 43.3 41.2  MCV 90.0 89.8  --  91.8  MCH 28.6 28.1  --  28.7  MCHC 31.8 31.3  --  31.3  RDW 16.8* 16.8*  --  16.9*  PLT 246 229  --  200   Thyroid No results for input(s): "TSH", "FREET4" in the last 168 hours.  BNP Recent Labs  Lab 09/11/22 0730  BNP 67.2    DDimer No results for input(s): "DDIMER" in the last 168 hours.   Radiology/Studies:  CT CHEST ABDOMEN PELVIS WO CONTRAST  Result Date: 09/10/2022 CLINICAL DATA:  Non-small cell lung cancer, elevated INR EXAM: CT CHEST, ABDOMEN AND PELVIS WITHOUT CONTRAST TECHNIQUE: Multidetector CT imaging of the chest, abdomen and pelvis was performed following the standard protocol without IV contrast. Examination was performed without intravenous contrast at the request of the ordering physician. Evaluation of the soft tissues, vascular structures, and solid viscera is limited. RADIATION DOSE REDUCTION: This exam was performed according to the departmental dose-optimization program which includes automated exposure control, adjustment of the mA and/or kV according to patient size and/or use of iterative reconstruction technique. COMPARISON:  05/21/2022, 06/10/2022 FINDINGS: CT CHEST FINDINGS Cardiovascular: Stable cardiac pacer. Mitral valve prosthesis. No pericardial effusion. Normal caliber of the thoracic aorta.  Stable atherosclerosis of the aorta and coronary vasculature. Mediastinum/Nodes: Thyroid, trachea, and esophagus are grossly unremarkable. 9 mm short axis pretracheal lymph node reference image 17/2, previously measuring approximately 12 mm. Evaluation of the mediastinum and hilar regions is limited due to the lack of intravenous contrast. Lungs/Pleura: Right upper lobe mass measures 6.9 x 5.0 cm reference image 15/2, previously measuring 6.4 x 4.6 cm, compatible with recurrent right upper lobe lung cancer. There is a large multiloculated right pleural effusion. Compressive atelectasis is seen within the right middle and right lower lobes, with minimal aeration superior segment  right lower lobe. There is increase in the size and number of left lung pulmonary nodules seen on prior study, consistent with progressive metastatic disease. Index nodule left upper lobe image 34/4 measures 12 x 8 mm, previously 6 x 5 mm. No left-sided effusion. No pneumothorax. Musculoskeletal: Stable right anterolateral second and third rib fractures. No new bony abnormalities. Reconstructed images demonstrate no additional findings. CT ABDOMEN PELVIS FINDINGS Hepatobiliary: Gallbladder sludge layering dependently within the gallbladder. No calcified gallstones or cholecystitis. Indeterminate 8 mm hypodensity inferior right lobe liver image 59/2, metastatic disease cannot be excluded. No biliary duct dilation. Pancreas: Unremarkable unenhanced appearance. Spleen: Unremarkable unenhanced appearance. Adrenals/Urinary Tract: No urinary tract calculi or obstructive uropathy. The adrenals and bladder are grossly unremarkable. Stomach/Bowel: No bowel obstruction or ileus. No bowel wall thickening or inflammatory change. Vascular/Lymphatic: Aortic atherosclerosis. No enlarged abdominal or pelvic lymph nodes. Reproductive: Prior hysterectomy.  No adnexal masses. Other: No free fluid or free intraperitoneal gas. No abdominal wall hernia.  Musculoskeletal: No acute or destructive bony lesions. Reconstructed images demonstrate no additional findings. IMPRESSION: 1. Enlarging right upper lobe mass consistent with progressive lung cancer. 2. Large multiloculated right pleural effusion, increased in size since prior study. Significant compressive atelectasis within the right middle and right lower lobe, with only a small portion of normally aerated lung within the superior segment right lower lobe. 3. New and enlarging left lung pulmonary nodules consistent with progressive metastatic disease. 4. Indeterminate 8 mm hypodensity inferior right lobe liver, not well seen on previous exams. Metastatic disease cannot be excluded. 5. Grossly stable borderline enlarged mediastinal adenopathy. 6. Gallbladder sludge. No evidence of cholelithiasis or cholecystitis. 7.  Aortic Atherosclerosis (ICD10-I70.0). Electronically Signed   By: Randa Ngo M.D.   On: 09/10/2022 17:50     Assessment and Plan:   Hemoptsysis/supratherapeurtic INR - presented with hemoptysis and INR > 10 s/p Kcnetra and Vitamin K - she was also on ASA>this was held - INR down to 1.3 - stable H&H - patient reported only 1 episode of hemoptysis, no further episodes - h/o MVR and Afib, started on IV heparin - follow-up INR and restart warfarin with goal 2.5-3.5  Paroxysmal Afib - warfarin hled as above for supratherapeutic INR - started on IV heparin, plan as above - she is maintaining NSR  Rheumatic mitral valve disease s/p mechanial MVT - needs indefinite anticoagulation with warfarin - bridge with IV heparin and restart warfarin with goal 8.7-5.6  Chronic systolic CHF/ICM S/p ICD - long standing history of low EF 25-30% on echo 01/2022 - euvolemic on exam - PTA Entresto, toprol, lasix, spiro and metolazone held for hypotension - on midodrine 10mg  TID - wean midodrine and restart home meds as able  CAD - ASA held as above - no anginal symptoms reported - continue  statin  For questions or updates, please contact [place] Please consult [email or web address] for contact info under    Signed, Arlen Legendre Ninfa Meeker, PA-C  09/11/2022 10:12 AM

## 2022-09-11 NOTE — Progress Notes (Signed)
  Echocardiogram 2D Echocardiogram has been performed. Definity IV imaging agent used on this study.  Vanessa Carlson 09/11/2022, 4:53 PM

## 2022-09-11 NOTE — Consult Note (Signed)
ANTICOAGULATION CONSULT NOTE - Initial Consult  Pharmacy Consult for Heparin Infusion with Bridge to Warfarin Indication: mMVR, AFib  No Known Allergies  Patient Measurements: Height: 5\' 2"  (157.5 cm) Weight: 92.5 kg (203 lb 14.8 oz) IBW/kg (Calculated) : 50.1 Heparin Dosing Weight: 71.6 kg  Vital Signs: Temp: 97.6 F (36.4 C) (01/13 0330) Temp Source: Oral (01/13 0330) BP: 106/62 (01/13 0700) Pulse Rate: 91 (01/13 0700)  Labs: Recent Labs    09/10/22 1329 09/10/22 1720 09/10/22 1959 09/10/22 2152 09/11/22 0444  HGB 13.7 13.8 13.4  --  12.9  HCT 43.1 44.1 43.3  --  41.2  PLT 246 229  --   --  200  APTT 177*  --   --   --   --   LABPROT 79.8* 84.1*  --  17.1* 16.4*  INR 10.1* >10.0*  --  1.4* 1.3*  CREATININE 1.81* 1.97*  --   --  2.16*    Estimated Creatinine Clearance: 28.6 mL/min (A) (by C-G formula based on SCr of 2.16 mg/dL (H)).   Medical History: Past Medical History:  Diagnosis Date   AICD (automatic cardioverter/defibrillator) present    Anterior myocardial infarction [facility])    a. 10/2014 - occluded LAD, complicated by cardiogenic shock-->Med Rx as interventional team was unable to open LAD.   Arthritis    arthirits of knee on right   Cancer of upper lobe of right lung (HCC) 09/2017   radiation therapy right side   Cervical cancer (HCC)    in remission for ~20 yrs, s/p hysterectomy   Chronic combined systolic and diastolic CHF (congestive heart failure) (HCC)    a. 10/2015 Echo: EF 20-25%, sev diast dysfxn, mildly reduced RV fxn. Nl MV prosthesis; b. 12/2017 Echo: EF 20-25%, diff HK. Sev ant/antsept HK, apical AK. Mild MS (mean graad 01/2018). Mod TR. PASP .   Coronary artery disease    a. 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   Essential hypertension    Ischemic cardiomyopathy    a. 03/2015 s/p MDT single lead AICD;  b. 10/2015 Echo: EF 20-25%;  c. 12/2017 Echo:  EF 20-25%, diff HK.   Mass of upper lobe of right lung    a. noted on CXR & CT 08/2017 w/ abnl PET CT.   Mitral valve disease    a. 2006 s/p MVR w/ 50mm SJM bileaflet mechanical valve-->chronic coumadin/ASA; b. 12/2017 Echo: Mild MS. Mean gradient 01/2018.   Personal history of radiation therapy    f/u lung ca    Medications:  Scheduled:   atorvastatin  80 mg Oral Daily   Chlorhexidine Gluconate Cloth  6 each Topical Daily   ezetimibe  10 mg Oral Daily   gabapentin  300 mg Oral BID   midodrine  10 mg Oral TID WC   Infusions:   sodium chloride Stopped (09/11/22 0721)   PRN: acetaminophen **OR** acetaminophen, ondansetron **OR** ondansetron (ZOFRAN) IV, oxyCODONE, senna-docusate  PTA Warfarin: 35 mg/wk 5 mg daily   Assessment: Vanessa Carlson is a 62 y.o. female presenting with hemoptysis with INR >10. 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 Patient was on [facility] PTA per chart review. Last dose of warfarin was 1/11 @ 2200. Warfarin was reversed with Kcentra and vitamin K 10 mg on  1/12, and patient was admitted to ICU. Pharmacy has been consulted to initiate and manage heparin infusion.   Baseline Labs: aPTT 36, PT 16.4, INR 1.3, Hgb 12.9, Hct 41.2, Plt 200   Goal of Therapy:  INR 2.5-3.5 Heparin level 0.3-0.7 units/ml Monitor platelets by anticoagulation protocol: Yes   Monitoring Heparin:  Date Time HL Rate/Comment    Warfarin: Date    INR      Warfarin Dose    1/13 1.3 5 mg 1/11 @ 2200 then Kcentra + vitK 10 mg 1/12 @ 2000   Plan:  Heparin Give 1800 units bolus x 1 (reduced given recent bleed) Start heparin infusion at 1000 units/hr Check HL in 6 hours  Continue to monitor H&H and platelets daily while on heparin infusion  Warfarin Give warfarin 5 mg x1 dose today  Anticipate  delay in INR increase over next few days given reversal with Kcentra and vitamin K Continue on parenteral bridge therapy until INR therapeutic  Check INR daily until stable Check CBC at least weekly while on warfarin   Celene Squibb, PharmD PGY1 Pharmacy Resident 09/11/2022 10:56 AM

## 2022-09-12 DIAGNOSIS — N179 Acute kidney failure, unspecified: Secondary | ICD-10-CM | POA: Diagnosis not present

## 2022-09-12 DIAGNOSIS — I480 Paroxysmal atrial fibrillation: Secondary | ICD-10-CM | POA: Diagnosis not present

## 2022-09-12 DIAGNOSIS — R791 Abnormal coagulation profile: Secondary | ICD-10-CM | POA: Diagnosis not present

## 2022-09-12 DIAGNOSIS — R042 Hemoptysis: Secondary | ICD-10-CM | POA: Diagnosis not present

## 2022-09-12 LAB — CBC
HCT: 37.9 % (ref 36.0–46.0)
Hemoglobin: 11.7 g/dL — ABNORMAL LOW (ref 12.0–15.0)
MCH: 28.3 pg (ref 26.0–34.0)
MCHC: 30.9 g/dL (ref 30.0–36.0)
MCV: 91.5 fL (ref 80.0–100.0)
Platelets: 175 10*3/uL (ref 150–400)
RBC: 4.14 MIL/uL (ref 3.87–5.11)
RDW: 16.9 % — ABNORMAL HIGH (ref 11.5–15.5)
WBC: 10.6 10*3/uL — ABNORMAL HIGH (ref 4.0–10.5)
nRBC: 0 % (ref 0.0–0.2)

## 2022-09-12 LAB — BASIC METABOLIC PANEL
Anion gap: 7 (ref 5–15)
BUN: 24 mg/dL — ABNORMAL HIGH (ref 8–23)
CO2: 27 mmol/L (ref 22–32)
Calcium: 8.6 mg/dL — ABNORMAL LOW (ref 8.9–10.3)
Chloride: 100 mmol/L (ref 98–111)
Creatinine, Ser: 1.47 mg/dL — ABNORMAL HIGH (ref 0.44–1.00)
GFR, Estimated: 40 mL/min — ABNORMAL LOW (ref >60)
Glucose, Bld: 106 mg/dL — ABNORMAL HIGH (ref 70–99)
Potassium: 4.3 mmol/L (ref 3.5–5.1)
Sodium: 134 mmol/L — ABNORMAL LOW (ref 135–145)

## 2022-09-12 LAB — HEPARIN LEVEL (UNFRACTIONATED): Heparin Unfractionated: 0.54 IU/mL (ref 0.30–0.70)

## 2022-09-12 LAB — PROCALCITONIN: Procalcitonin: 0.34 ng/mL

## 2022-09-12 LAB — PHOSPHORUS: Phosphorus: 2.1 mg/dL — ABNORMAL LOW (ref 2.5–4.6)

## 2022-09-12 LAB — PROTIME-INR
INR: 1.2 (ref 0.8–1.2)
Prothrombin Time: 15.5 seconds — ABNORMAL HIGH (ref 11.4–15.2)

## 2022-09-12 LAB — GLUCOSE, CAPILLARY: Glucose-Capillary: 109 mg/dL — ABNORMAL HIGH (ref 70–99)

## 2022-09-12 MED ORDER — SODIUM CHLORIDE 0.9% FLUSH: 10.0000 mL | INTRAVENOUS | Status: DC | PRN

## 2022-09-12 MED ORDER — METOPROLOL SUCCINATE ER 25 MG PO TB24
25.0000 mg | ORAL_TABLET | Freq: Every day | ORAL | Status: DC
  Administered 2022-09-12 – 2022-09-15 (×4): 25 mg via ORAL
  Filled 2022-09-12 (×6): qty 1

## 2022-09-12 MED ORDER — POTASSIUM & SODIUM PHOSPHATES 280-160-250 MG PO PACK
2.0000 | PACK | ORAL | Status: AC
  Administered 2022-09-12 (×2): 2 via ORAL
  Filled 2022-09-12 (×2): qty 2

## 2022-09-12 MED ORDER — WARFARIN SODIUM 7.5 MG PO TABS
7.5000 mg | ORAL_TABLET | Freq: Once | ORAL | Status: DC
  Filled 2022-09-12: qty 1

## 2022-09-12 NOTE — Progress Notes (Signed)
Removed pts R arm PIV due to bleeding from heparin, Had to hold pressure for 30 min as well as apply clot stop as well as a pressure wrap. Notified MD will continue to monitor.

## 2022-09-12 NOTE — Consult Note (Signed)
**Note Vanessa-Identified via Obfuscation** PHARMACY CONSULT NOTE - ELECTROLYTES  Pharmacy Consult for Electrolyte Monitoring and Replacement   Recent Labs: Potassium (mmol/L)  Date Value  09/12/2022 4.3   Magnesium (mg/dL)  Date Value  59/16/1988 2.5 (H)   Calcium (mg/dL)  Date Value  22/03/8684 8.6 (L)   Albumin (g/dL)  Date Value  52/50/6049 4.2  08/18/2018 4.2   Phosphorus (mg/dL)  Date Value  33/19/9190 2.2 (L)   Sodium (mmol/L)  Date Value  09/12/2022 134 (L)  12/19/2019 145 (H)   Corrected Ca: 8.6 mg/dL  Assessment  Vanessa Carlson is a 62 y.o. female presenting with hemoptysis with INR >10. 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 Pharmacy has been consulted to monitor and replace phosphorous.  Diet: Regular  Goal of Therapy: Electrolytes WNL  Plan:  Phosphorus: 2.2 >> 2.1, give PhosNAK 2 packets Q4H x2 doses Check Phos with AM labs  Thank you for allowing pharmacy to be a part of this patient's care.  Celene Squibb, PharmD PGY1 Pharmacy Resident 09/12/2022 11:34 AM

## 2022-09-12 NOTE — Consult Note (Signed)
ANTICOAGULATION CONSULT NOTE  Pharmacy Consult for Heparin Infusion with Bridge to Warfarin Indication: mMVR, AFib  No Known Allergies  Patient Measurements: Height: 5\' 2"  (157.5 cm) Weight: 92.5 kg (203 lb 14.8 oz) IBW/kg (Calculated) : 50.1 Heparin Dosing Weight: 71.6 kg  Vital Signs: Temp: 97.7 F (36.5 C) (01/14 0737) Temp Source: Oral (01/14 0737) BP: 102/59 (01/14 1000) Pulse Rate: 110 (01/14 1000)  Labs: Recent Labs    09/10/22 1329 09/10/22 1720 09/10/22 1959 09/10/22 2152 09/11/22 0444 09/11/22 1101 09/11/22 1737 09/12/22 0021 09/12/22 0430  HGB 13.7 13.8 13.4  --  12.9  --   --   --  11.7*  HCT 43.1 44.1 43.3  --  41.2  --   --   --  37.9  PLT 246 229  --   --  200  --   --   --  175  APTT 177*  --   --   --   --  36  --   --   --   LABPROT 79.8* 84.1*  --  17.1* 16.4*  --   --   --  15.5*  INR 10.1* >10.0*  --  1.4* 1.3*  --   --   --  1.2  HEPARINUNFRC  --   --   --   --   --   --  0.39 0.54  --   CREATININE 1.81* 1.97*  --   --  2.16*  --   --   --  1.47*     Estimated Creatinine Clearance: 42 mL/min (A) (by C-G formula based on SCr of 1.47 mg/dL (H)).   Medical History: Past Medical History:  Diagnosis Date   AICD (automatic cardioverter/defibrillator) present    Anterior myocardial infarction [facility])    a. 10/2014 - occluded LAD, complicated by cardiogenic shock-->Med Rx as interventional team was unable to open LAD.   Arthritis    arthirits of knee on right   Cancer of upper lobe of right lung (HCC) 09/2017   radiation therapy right side   Cervical cancer (HCC)    in remission for ~20 yrs, s/p hysterectomy   Chronic combined systolic and diastolic CHF (congestive heart failure) (HCC)    a. 10/2015 Echo: EF 20-25%, sev diast dysfxn, mildly reduced RV fxn. Nl MV prosthesis; b. 12/2017 Echo: EF 20-25%, diff HK. Sev ant/antsept HK, apical AK. Mild MS (mean graad 01/2018). Mod TR. PASP .   Coronary artery disease    a. 10/2014 Ant STEMI/Cath: LAD  100p ->Med managed as lesion could not be crossed-->complicated by CGS and post-MI pericarditis (UVA).   Essential hypertension    Ischemic cardiomyopathy    a. 03/2015 s/p MDT single lead AICD;  b. 10/2015 Echo: EF 20-25%;  c. 12/2017 Echo: EF 20-25%, diff HK.   Mass of upper lobe of right lung    a. noted on CXR & CT 08/2017 w/ abnl PET CT.   Mitral valve disease    a. 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 12/2017 Echo: Mild MS. Mean gradient 01/2018.   Personal history of radiation therapy    f/u lung ca    Medications:  Scheduled:   atorvastatin  80 mg Oral Daily   Chlorhexidine Gluconate Cloth  6 each Topical Daily   ezetimibe  10 mg Oral Daily   feeding supplement  237 mL Oral BID BM   gabapentin  300 mg Oral BID   metoprolol succinate  25  mg Oral Daily   midodrine  10 mg Oral TID WC   multivitamin with minerals  1 tablet Oral Daily   Warfarin - Pharmacist Dosing Inpatient   Does not apply q1600   Infusions:   sodium chloride Stopped (09/11/22 0721)   heparin 1,000 Units/hr (09/12/22 0800)   PRN: acetaminophen **OR** acetaminophen, ondansetron **OR** ondansetron (ZOFRAN) IV, oxyCODONE, senna-docusate  PTA Warfarin: 35 mg/wk 5 mg daily   Assessment: Vanessa Carlson is a 62 y.o. female presenting with hemoptysis with INR >10. 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 Patient was on [facility] PTA per chart review. Last dose of warfarin was 1/11 @ 2200. Patient was supratherapeutic on home regimen. Warfarin was reversed with Kcentra and vitamin K 10 mg on 1/12, and patient was admitted to ICU. Pharmacy has been consulted to initiate and manage heparin infusion.   Baseline Labs: aPTT 36, PT 16.4, INR 1.3, Hgb 12.9, Hct 41.2, Plt 200   Goal of Therapy:  INR  2.5-3.5 Heparin level 0.3-0.7 units/ml Monitor platelets by anticoagulation protocol: Yes   Monitoring Heparin:  Date Time HL Rate/Comment  1/13 1737 0.39 Therapeutic x1 1/14 0021 0.54 Therapeutic x 2  Warfarin: Date    INR      Warfarin Dose    1/13 1.3 5 mg 1/11 @ 2200 then Kcentra + vitK 10 mg 1/12 @ 2000 1/14 1.2 5 mg 1/13  Plan:  Heparin Continue heparin infusion at 1000 units/hr Recheck HL daily w/ AM labs Continue to monitor H&H and platelets daily while on heparin infusion  Warfarin Give warfarin 7.5 mg x1 dose today  Anticipate delay in INR increase over next few days given reversal with Kcentra and vitamin K Continue on parenteral bridge therapy until INR therapeutic  Check INR daily until stable Check CBC at least weekly while on warfarin   Celene Squibb, PharmD PGY1 Pharmacy Resident 09/12/2022 11:18 AM

## 2022-09-12 NOTE — Consult Note (Signed)
ANTICOAGULATION CONSULT NOTE  Pharmacy Consult for Heparin Infusion with Bridge to Warfarin Indication: mMVR, AFib  No Known Allergies  Patient Measurements: Height: 5\' 2"  (157.5 cm) Weight: 92.5 kg (203 lb 14.8 oz) IBW/kg (Calculated) : 50.1 Heparin Dosing Weight: 71.6 kg  Vital Signs: Temp: 98.2 F (36.8 C) (01/13 1946) Temp Source: Oral (01/13 1946) BP: 89/55 (01/13 2158) Pulse Rate: 96 (01/13 1900)  Labs: Recent Labs    09/10/22 1329 09/10/22 1720 09/10/22 1959 09/10/22 2152 09/11/22 0444 09/11/22 1101 09/11/22 1737 09/12/22 0021  HGB 13.7 13.8 13.4  --  12.9  --   --   --   HCT 43.1 44.1 43.3  --  41.2  --   --   --   PLT 246 229  --   --  200  --   --   --   APTT 177*  --   --   --   --  36  --   --   LABPROT 79.8* 84.1*  --  17.1* 16.4*  --   --   --   INR 10.1* >10.0*  --  1.4* 1.3*  --   --   --   HEPARINUNFRC  --   --   --   --   --   --  0.39 0.54  CREATININE 1.81* 1.97*  --   --  2.16*  --   --   --      Estimated Creatinine Clearance: 28.6 mL/min (A) (by C-G formula based on SCr of 2.16 mg/dL (H)).   Medical History: Past Medical History:  Diagnosis Date   AICD (automatic cardioverter/defibrillator) present    Anterior myocardial infarction [facility])    a. 10/2014 - occluded LAD, complicated by cardiogenic shock-->Med Rx as interventional team was unable to open LAD.   Arthritis    arthirits of knee on right   Cancer of upper lobe of right lung (HCC) 09/2017   radiation therapy right side   Cervical cancer (HCC)    in remission for ~20 yrs, s/p hysterectomy   Chronic combined systolic and diastolic CHF (congestive heart failure) (HCC)    a. 10/2015 Echo: EF 20-25%, sev diast dysfxn, mildly reduced RV fxn. Nl MV prosthesis; b. 12/2017 Echo: EF 20-25%, diff HK. Sev ant/antsept HK, apical AK. Mild MS (mean graad 01/2018). Mod TR. PASP .   Coronary artery disease    a. 10/2014 Ant STEMI/Cath: LAD 100p ->Med managed as lesion could not be  crossed-->complicated by CGS and post-MI pericarditis (UVA).   Essential hypertension    Ischemic cardiomyopathy    a. 03/2015 s/p MDT single lead AICD;  b. 10/2015 Echo: EF 20-25%;  c. 12/2017 Echo: EF 20-25%, diff HK.   Mass of upper lobe of right lung    a. noted on CXR & CT 08/2017 w/ abnl PET CT.   Mitral valve disease    a. 2006 s/p MVR w/ 80mm SJM bileaflet mechanical valve-->chronic coumadin/ASA; b. 12/2017 Echo: Mild MS. Mean gradient 01/2018.   Personal history of radiation therapy    f/u lung ca    Medications:  Scheduled:   atorvastatin  80 mg Oral Daily   Chlorhexidine Gluconate Cloth  6 each Topical Daily   ezetimibe  10 mg Oral Daily   feeding supplement  237 mL Oral BID BM   gabapentin  300 mg Oral BID   midodrine  10 mg Oral TID WC   multivitamin with minerals  1 tablet Oral Daily  Warfarin - Pharmacist Dosing Inpatient   Does not apply q1600   Infusions:   sodium chloride Stopped (09/11/22 0721)   heparin 1,000 Units/hr (09/11/22 2150)   PRN: acetaminophen **OR** acetaminophen, ondansetron **OR** ondansetron (ZOFRAN) IV, oxyCODONE, senna-docusate  PTA Warfarin: 35 mg/wk 5 mg daily   Assessment: Vanessa Carlson is a 62 y.o. female presenting with hemoptysis with INR >10. 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 Patient was on [facility] PTA per chart review. Last dose of warfarin was 1/11 @ 2200. Warfarin was reversed with Kcentra and vitamin K 10 mg on 1/12, and patient was admitted to ICU. Pharmacy has been consulted to initiate and manage heparin infusion.   Baseline Labs: aPTT 36, PT 16.4, INR 1.3, Hgb 12.9, Hct 41.2, Plt 200   Goal of Therapy:  INR 2.5-3.5 Heparin level 0.3-0.7 units/ml Monitor platelets by anticoagulation protocol: Yes   Monitoring Heparin:   Date Time HL Rate/Comment  1/13 1737 0.39 Therapeutic x1 1/14 0021 0.54 Therapeutic x 2  Warfarin: Date    INR      Warfarin Dose    1/13 1.3 5 mg 1/11 @ 2200 then Kcentra + vitK 10 mg 1/12 @ 2000   Plan:  Heparin Continue heparin infusion at 1000 units/hr Recheck HL daily w/ AM labs Continue to monitor H&H and platelets daily while on heparin infusion  Warfarin Give warfarin 5 mg x1 dose today  Anticipate delay in INR increase over next few days given reversal with Kcentra and vitamin K Continue on parenteral bridge therapy until INR therapeutic  Check INR daily until stable Check CBC at least weekly while on warfarin   Otelia Sergeant, PharmD, [facility] 09/12/2022 1:17 AM

## 2022-09-12 NOTE — Progress Notes (Signed)
Cardiology Progress Note  Patient ID: Vanessa Carlson MRN: [id number] DOB: Apr 15, 1961 Date of Encounter: 09/12/2022  Primary Cardiologist: Yvonne Kendall, MD  Subjective   Chief Complaint: SOB  HPI: Reports she is feeling better.  Creatinine coming down.  Blood pressure improving.  Plan for thoracentesis tomorrow.  ROS:  All other ROS reviewed and negative. Pertinent positives noted in the HPI.     Inpatient Medications  Scheduled Meds:  atorvastatin  80 mg Oral Daily   Chlorhexidine Gluconate Cloth  6 each Topical Daily   ezetimibe  10 mg Oral Daily   feeding supplement  237 mL Oral BID BM   gabapentin  300 mg Oral BID   metoprolol succinate  25 mg Oral Daily   midodrine  10 mg Oral TID WC   multivitamin with minerals  1 tablet Oral Daily   Warfarin - Pharmacist Dosing Inpatient   Does not apply q1600   Continuous Infusions:  sodium chloride Stopped (09/11/22 0721)   heparin 1,000 Units/hr (09/12/22 0800)   PRN Meds: acetaminophen **OR** acetaminophen, ondansetron **OR** ondansetron (ZOFRAN) IV, oxyCODONE, senna-docusate   Vital Signs   Vitals:   09/12/22 0600 09/12/22 0737 09/12/22 0800 09/12/22 1000  BP: (!) 93/54 (!) 93/54 108/63 (!) 102/59  Pulse: (!) 102  (!) 110 (!) 110  Resp: 16  19 (!) 23  Temp:  97.7 F (36.5 C)    TempSrc:  Oral    SpO2: 100%  94% 93%  Weight:      Height:        Intake/Output Summary (Last 24 hours) at 09/12/2022 1108 Last data filed at 09/12/2022 0800 Gross per 24 hour  Intake 1183.69 ml  Output 600 ml  Net 583.69 ml      09/11/2022    3:32 AM 09/10/2022    1:58 PM 08/20/2022    2:06 PM  Last 3 Weights  Weight (lbs) 203 lb 14.8 oz 199 lb 208 lb  Weight (kg) 92.5 kg 90.266 kg 94.348 kg      Telemetry  Overnight telemetry shows sinus tachycardia heart rate low 100s, which I personally reviewed.    Physical Exam   Vitals:   09/12/22 0600 09/12/22 0737 09/12/22 0800 09/12/22 1000  BP: (!) 93/54 (!) 93/54 108/63 (!)  102/59  Pulse: (!) 102  (!) 110 (!) 110  Resp: 16  19 (!) 23  Temp:  97.7 F (36.5 C)    TempSrc:  Oral    SpO2: 100%  94% 93%  Weight:      Height:        Intake/Output Summary (Last 24 hours) at 09/12/2022 1108 Last data filed at 09/12/2022 0800 Gross per 24 hour  Intake 1183.69 ml  Output 600 ml  Net 583.69 ml       09/11/2022    3:32 AM 09/10/2022    1:58 PM 08/20/2022    2:06 PM  Last 3 Weights  Weight (lbs) 203 lb 14.8 oz 199 lb 208 lb  Weight (kg) 92.5 kg 90.266 kg 94.348 kg    Body mass index is 37.3 kg/m.  General: Well nourished, well developed, in no acute distress Head: Atraumatic, normal size  Eyes: PEERLA, EOMI  Neck: Supple, no JVD Endocrine: No thryomegaly Cardiac: Normal S1, mechanical S2, no murmurs Lungs: Diminished breath sounds in the right lung fields Abd: Soft, nontender, no hepatomegaly  Ext: No edema, pulses 2+ Musculoskeletal: No deformities, BUE and BLE strength normal and equal Skin: Warm and dry, no rashes  Neuro: Alert and oriented to person, place, time, and situation, CNII-XII grossly intact, no focal deficits  Psych: Normal mood and affect   Labs  High Sensitivity Troponin:  No results for input(s): "TROPONINIHS" in the last 720 hours.   Cardiac EnzymesNo results for input(s): "TROPONINI" in the last 168 hours. No results for input(s): "TROPIPOC" in the last 168 hours.  Chemistry Recent Labs  Lab 09/10/22 1329 09/10/22 1720 09/11/22 0444 09/12/22 0430  NA 137 137 136 134*  K 3.4* 3.5 3.3* 4.3  CL 97* 98 100 100  CO2 27 27 26 27   GLUCOSE 132* 119* 109* 106*  BUN 20 20 23  24*  CREATININE 1.81* 1.97* 2.16* 1.47*  CALCIUM 9.1 9.3 8.6* 8.6*  PROT 8.1 8.5*  --   --   ALBUMIN 4.0 4.2  --   --   AST 31 33  --   --   ALT 17 17  --   --   ALKPHOS 193* 203*  --   --   BILITOT 1.8* 2.3*  --   --   GFRNONAA 31* 28* 25* 40*  ANIONGAP 13 12 10 7     Hematology Recent Labs  Lab 09/10/22 1720 09/10/22 1959 09/11/22 0444  09/12/22 0430  WBC 10.2  --  11.4* 10.6*  RBC 4.91  --  4.49 4.14  HGB 13.8 13.4 12.9 11.7*  HCT 44.1 43.3 41.2 37.9  MCV 89.8  --  91.8 91.5  MCH 28.1  --  28.7 28.3  MCHC 31.3  --  31.3 30.9  RDW 16.8*  --  16.9* 16.9*  PLT 229  --  200 175   BNP Recent Labs  Lab 09/11/22 0730  BNP 67.2    DDimer No results for input(s): "DDIMER" in the last 168 hours.   Radiology  ECHOCARDIOGRAM COMPLETE  Result Date: 09/11/2022    ECHOCARDIOGRAM REPORT   Patient Name:   Vanessa Carlson Asselin Date of Exam: 09/11/2022 Medical Rec #:  09/13/2022         Height:       62.0 in Accession #:    Agustin Cree        Weight:       203.9 lb Date of Birth:  [birth year]          BSA:          1.928 m Patient Age:    61 years          BP:           93/62 mmHg Patient Gender: F                 HR:           83 bpm. Exam Location:  [facility] Procedure: 2D Echo and Intracardiac Opacification Agent Indications:     CHF I50.21  History:         Patient has prior history of Echocardiogram examinations, most                  recent 01/28/2022.                   Mitral Valve: 27 mm mechanical valve valve is present in the                  mitral position.  Sonographer:     [id number] RDCS Referring Phys:  02/29/1961 03/30/2022 O'NEAL Diagnosing Phys: Overton Mam MD  Sonographer Comments: Technically difficult study due to poor  echo windows and suboptimal subcostal window. Image acquisition challenging due to patient body habitus. IMPRESSIONS  1. Left ventricular ejection fraction, by estimation, is 20 to 25%. The left ventricle has severely decreased function. The left ventricle demonstrates regional wall motion abnormalities (see scoring diagram/findings for description). Left ventricular diastolic function could not be evaluated.  2. Right ventricular systolic function is normal. The right ventricular size is normal. There is normal pulmonary artery systolic pressure. The estimated right ventricular systolic pressure is 31.7 mmHg.  3.  The mitral valve has been repaired/replaced. No evidence of mitral valve regurgitation. The mean mitral valve gradient is 2.0 mmHg with average heart rate of 85 bpm. There is a 27 mm mechanical valve present in the mitral position. Echo findings are consistent with normal structure and function of the mitral valve prosthesis.  4. The aortic valve is tricuspid. Aortic valve regurgitation is not visualized. No aortic stenosis is present.  5. The inferior vena cava is normal in size with greater than 50% respiratory variability, suggesting right atrial pressure of 3 mmHg. Comparison(s): No significant change from prior study. Conclusion(s)/Recommendation(s): Findings consistent with ischemic cardiomyopathy. No left ventricular mural or apical thrombus/thrombi. FINDINGS  Left Ventricle: Left ventricular ejection fraction, by estimation, is 20 to 25%. The left ventricle has severely decreased function. The left ventricle demonstrates regional wall motion abnormalities. Definity contrast agent was given IV to delineate the left ventricular endocardial borders. The left ventricular internal cavity size was normal in size. There is no left ventricular hypertrophy. Left ventricular diastolic function could not be evaluated due to mitral valve replacement. Left ventricular  diastolic function could not be evaluated.  LV Wall Scoring: The mid and distal anterior septum, apical lateral segment, apical anterior segment, and apex are akinetic. The anterior wall, antero-lateral wall, entire inferior wall, posterior wall, basal anteroseptal segment, mid inferoseptal segment, and basal inferoseptal segment are hypokinetic. Right Ventricle: The right ventricular size is normal. No increase in right ventricular wall thickness. Right ventricular systolic function is normal. There is normal pulmonary artery systolic pressure. The tricuspid regurgitant velocity is 2.68 m/s, and  with an assumed right atrial pressure of 3 mmHg, the  estimated right ventricular systolic pressure is 31.7 mmHg. Left Atrium: Left atrial size was normal in size. Right Atrium: Right atrial size was normal in size. Pericardium: There is no evidence of pericardial effusion. Mitral Valve: The mitral valve has been repaired/replaced. No evidence of mitral valve regurgitation. There is a 27 mm mechanical valve present in the mitral position. Echo findings are consistent with normal structure and function of the mitral valve prosthesis. MV peak gradient, 4.8 mmHg. The mean mitral valve gradient is 2.0 mmHg with average heart rate of 85 bpm. Tricuspid Valve: The tricuspid valve is grossly normal. Tricuspid valve regurgitation is mild . No evidence of tricuspid stenosis. Aortic Valve: The aortic valve is tricuspid. Aortic valve regurgitation is not visualized. No aortic stenosis is present. Aortic valve peak gradient measures 6.2 mmHg. Pulmonic Valve: The pulmonic valve was grossly normal. Pulmonic valve regurgitation is mild. No evidence of pulmonic stenosis. Aorta: The aortic root and ascending aorta are structurally normal, with no evidence of dilitation. Venous: The inferior vena cava is normal in size with greater than 50% respiratory variability, suggesting right atrial pressure of 3 mmHg. IAS/Shunts: The atrial septum is grossly normal. Additional Comments: A device lead is visualized in the right atrium and right ventricle.  LEFT VENTRICLE PLAX 2D LVIDd:  5.20 cm     Diastology LVIDs:         4.70 cm     LV e' medial:    6.53 cm/s LV PW:         0.80 cm     LV E/e' medial:  11.6 LV IVS:        0.80 cm     LV e' lateral:   8.16 cm/s LVOT diam:     1.65 cm     LV E/e' lateral: 9.3 LV SV:         28 LV SV Index:   14 LVOT Area:     2.14 cm  LV Volumes (MOD) LV vol d, MOD A2C: 98.8 ml LV vol d, MOD A4C: 94.4 ml LV vol s, MOD A2C: 49.1 ml LV vol s, MOD A4C: 63.6 ml LV SV MOD A2C:     49.8 ml LV SV MOD A4C:     94.4 ml LV SV MOD BP:      42.9 ml RIGHT VENTRICLE RV  Basal diam:  2.20 cm LEFT ATRIUM             Index LA diam:        3.50 cm 1.82 cm/m LA Vol (A2C):   82.2 ml 42.64 ml/m LA Vol (A4C):   30.2 ml 15.67 ml/m LA Biplane Vol: 51.7 ml 26.82 ml/m  AORTIC VALVE                 PULMONIC VALVE AV Area (Vmax): 1.28 cm     PV Vmax:          0.90 m/s AV Vmax:        124.00 cm/s  PV Peak grad:     3.2 mmHg AV Peak Grad:   6.2 mmHg     PR End Diast Vel: 5.20 msec LVOT Vmax:      74.20 cm/s LVOT Vmean:     46.800 cm/s LVOT VTI:       0.130 m  AORTA Ao Root diam: 2.70 cm Ao Asc diam:  2.40 cm MITRAL VALVE                TRICUSPID VALVE MV Area (PHT): 3.27 cm     TR Peak grad:   28.7 mmHg MV Area VTI:   1.27 cm     TR Vmax:        268.00 cm/s MV Peak grad:  4.8 mmHg MV Mean grad:  2.0 mmHg     SHUNTS MV Vmax:       1.10 m/s     Systemic VTI:  0.13 m MV Vmean:      55.7 cm/s    Systemic Diam: 1.65 cm MV Decel Time: 232 msec MV E velocity: 75.50 cm/s MV A velocity: 112.00 cm/s MV E/A ratio:  0.67 Lennie Odor MD Electronically signed by Lennie Odor MD Signature Date/Time: 09/11/2022/5:40:25 PM    Final    CT CHEST ABDOMEN PELVIS WO CONTRAST  Result Date: 09/10/2022 CLINICAL DATA:  Non-small cell lung cancer, elevated INR EXAM: CT CHEST, ABDOMEN AND PELVIS WITHOUT CONTRAST TECHNIQUE: Multidetector CT imaging of the chest, abdomen and pelvis was performed following the standard protocol without IV contrast. Examination was performed without intravenous contrast at the request of the ordering physician. Evaluation of the soft tissues, vascular structures, and solid viscera is limited. RADIATION DOSE REDUCTION: This exam was performed according to the departmental dose-optimization program which includes automated exposure control, adjustment of  the mA and/or kV according to patient size and/or use of iterative reconstruction technique. COMPARISON:  05/21/2022, 06/10/2022 FINDINGS: CT CHEST FINDINGS Cardiovascular: Stable cardiac pacer. Mitral valve prosthesis. No pericardial  effusion. Normal caliber of the thoracic aorta. Stable atherosclerosis of the aorta and coronary vasculature. Mediastinum/Nodes: Thyroid, trachea, and esophagus are grossly unremarkable. 9 mm short axis pretracheal lymph node reference image 17/2, previously measuring approximately 12 mm. Evaluation of the mediastinum and hilar regions is limited due to the lack of intravenous contrast. Lungs/Pleura: Right upper lobe mass measures 6.9 x 5.0 cm reference image 15/2, previously measuring 6.4 x 4.6 cm, compatible with recurrent right upper lobe lung cancer. There is a large multiloculated right pleural effusion. Compressive atelectasis is seen within the right middle and right lower lobes, with minimal aeration superior segment right lower lobe. There is increase in the size and number of left lung pulmonary nodules seen on prior study, consistent with progressive metastatic disease. Index nodule left upper lobe image 34/4 measures 12 x 8 mm, previously 6 x 5 mm. No left-sided effusion. No pneumothorax. Musculoskeletal: Stable right anterolateral second and third rib fractures. No new bony abnormalities. Reconstructed images demonstrate no additional findings. CT ABDOMEN PELVIS FINDINGS Hepatobiliary: Gallbladder sludge layering dependently within the gallbladder. No calcified gallstones or cholecystitis. Indeterminate 8 mm hypodensity inferior right lobe liver image 59/2, metastatic disease cannot be excluded. No biliary duct dilation. Pancreas: Unremarkable unenhanced appearance. Spleen: Unremarkable unenhanced appearance. Adrenals/Urinary Tract: No urinary tract calculi or obstructive uropathy. The adrenals and bladder are grossly unremarkable. Stomach/Bowel: No bowel obstruction or ileus. No bowel wall thickening or inflammatory change. Vascular/Lymphatic: Aortic atherosclerosis. No enlarged abdominal or pelvic lymph nodes. Reproductive: Prior hysterectomy.  No adnexal masses. Other: No free fluid or free  intraperitoneal gas. No abdominal wall hernia. Musculoskeletal: No acute or destructive bony lesions. Reconstructed images demonstrate no additional findings. IMPRESSION: 1. Enlarging right upper lobe mass consistent with progressive lung cancer. 2. Large multiloculated right pleural effusion, increased in size since prior study. Significant compressive atelectasis within the right middle and right lower lobe, with only a small portion of normally aerated lung within the superior segment right lower lobe. 3. New and enlarging left lung pulmonary nodules consistent with progressive metastatic disease. 4. Indeterminate 8 mm hypodensity inferior right lobe liver, not well seen on previous exams. Metastatic disease cannot be excluded. 5. Grossly stable borderline enlarged mediastinal adenopathy. 6. Gallbladder sludge. No evidence of cholelithiasis or cholecystitis. 7.  Aortic Atherosclerosis (ICD10-I70.0). Electronically Signed   By: Sharlet Salina M.D.   On: 09/10/2022 17:50    Cardiac Studies  TTE 09/11/2021  1. Left ventricular ejection fraction, by estimation, is 20 to 25%. The  left ventricle has severely decreased function. The left ventricle  demonstrates regional wall motion abnormalities (see scoring  diagram/findings for description). Left ventricular  diastolic function could not be evaluated.   2. Right ventricular systolic function is normal. The right ventricular  size is normal. There is normal pulmonary artery systolic pressure. The  estimated right ventricular systolic pressure is 31.7 mmHg.   3. The mitral valve has been repaired/replaced. No evidence of mitral  valve regurgitation. The mean mitral valve gradient is 2.0 mmHg with  average heart rate of 85 bpm. There is a 27 mm mechanical valve present in  the mitral position. Echo findings are  consistent with normal structure and function of the mitral valve  prosthesis.   4. The aortic valve is tricuspid. Aortic valve regurgitation  is not  visualized. No aortic stenosis is present.   5. The inferior vena cava is normal in size with greater than 50%  respiratory variability, suggesting right atrial pressure of 3 mmHg.   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 Cardiology was consulted for anticoagulation management.   Assessment & Plan   # Supratherapeutic INR #Mechanical mitral valve -Admitted with INR value above 10 as well as hemoptysis.  Hemoglobin has been stable.  No signs of bleeding. -She has stage IV lung cancer.  She reports not eating or drinking well.  Highly suspect dehydration and poor oral intake contributed to her supratherapeutic INR. -No signs of bleeding.  Restarted heparin.  Now with progression of lung cancer and will need thoracentesis.  Hold Coumadin until pulmonary says it is okay to restart. -She clearly will need bridging with heparin with an INR goal of 2.5-3.5. -Cardiology will follow along for recommendations on this.  # Hypotension -Suspect this is secondary to dehydration and poor oral intake.  Her creatinine is improving.  No signs of infection that I can see. -Echo unchanged.  Does not appear to be in low output heart failure.  Blood pressure is actually improving this morning.  # Chronic systolic heart failure, EF 25% -Known history of an ischemic cardiomyopathy secondary to an occluded LAD.  Cardiac MRI at [facility] in 2016 showed nonviable myocardium. -She is euvolemic on examination.  Normal pulmonary pressures on echo. -Blood pressure is improving.  Will add back metoprolol today.  I suspect her tachycardia is rebound in the setting of holding her beta-blocker. -Cardiology will follow along to restart home medications.  No  need for diuretics.  # Stage IV lung cancer # Right loculated pleural effusion -Plan for thoracentesis tomorrow with pulmonary. -Suspect some of her dehydration and poor oral intake is related to progression of cancer.  Defer management to hospital medicine.  # Subacute cerebral infarcts -Seen on brain MRI in November.  Could be related to mechanical mitral valve.  No signs of stroke. -For now continue with INR goal 2.5-3.5.  I am not certain if raising her goal would be prudent in the setting of stage IV lung cancer.  # Paroxysmal A-fib -Maintaining sinus rhythm  # AKI -Improving.  Suspect dehydration was a big contributor.    For questions or updates, please contact [facility] Please consult [email or web address] for contact info under   Signed, Gerri Spore T. Flora Lipps, MD, [facility]   [facility]  09/12/2022 11:08 AM

## 2022-09-12 NOTE — Progress Notes (Signed)
NAME:  Vanessa Carlson, MRN:  [id number], DOB:  1961/07/12, LOS: 2 ADMISSION DATE:  09/10/2022, CONSULTATION DATE:  09/10/22 REFERRING MD:  Cox Amy, CHIEF COMPLAINT:  Hemoptysis    HPI  62 y.o. 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   ED Course: Initial vital signs showed HR of 109 beats/minute, BP 118/72 mm Hg, the RR 17 breaths/minute, and the oxygen saturation 96 % on and a temperature of 98.62F (37.0C).    Pertinent Labs/Diagnostics Findings: Chemistry:Na+/ K+: 137/3.5 Glucose:119 BUN/Cr.:20/1.97 CBC: Unremarkable Other Lab findings: PT/INR: 84.1/>10.0  P Imaging:CT Chest Abd/pelvis>see below   Significant Hospital Events   1/12: Admit to stepdown with Hemoptysis and supratherapeutic INR, developed hypotension. PCCM consulted for possible pressor requirement 09/11/22- patient s/p TTE today.  Ordered IR thoracentesis due to large atelectasis and sorrounding effusion. Family at bedside 09/12/22- patient is stable today no acute events overnight. For thoracentesis tommorow.    Consults:  PCCM  Procedures:  None  Significant Diagnostic Tests:  1/12: CT Chest/Abd/Pelvis>  IMPRESSION: 1. Enlarging right upper lobe mass consistent with progressive lung cancer. 2. Large multiloculated right pleural effusion, increased in size since prior study. Significant compressive atelectasis within the right middle and right lower lobe, with only a small portion of normally aerated lung within the superior segment right lower lobe. 3. New and enlarging left lung pulmonary nodules  consistent with progressive metastatic disease. 4. Indeterminate 8 mm hypodensity inferior right lobe liver, not well seen on previous exams. Metastatic disease cannot be excluded. 5. Grossly stable borderline enlarged mediastinal adenopathy. 6. Gallbladder sludge. No evidence of cholelithiasis or cholecystitis. 7.  Aortic Atherosclerosis (ICD10-I70.0).  Micro Data:  None  Antimicrobials:  None  OBJECTIVE  Blood pressure 108/63, pulse (!) 110, temperature 97.7 F (36.5 C), temperature source Oral, resp. rate 19, height 5\' 2"  (1.575 m), weight 92.5 kg, SpO2 94 %.        Intake/Output Summary (Last 24 hours) at 09/12/2022 1001 Last data filed at 09/12/2022 0800 Gross per 24 hour  Intake 1439.69 ml  Output 600 ml  Net 839.69 ml    Filed Weights   09/11/22 0332  Weight: 92.5 kg   Physical Examination  GENERAL:62  year-old critically ill patient lying in the bed with no acute distress.  EYES: Pupils equal, round, reactive to light and accommodation. No scleral icterus. Extraocular muscles intact.  HEENT: Head atraumatic, normocephalic. Oropharynx and nasopharynx clear.  NECK:  Supple, no jugular venous distention. No thyroid enlargement, no tenderness.  LUNGS: Normal breath sounds bilaterally, no wheezing, rales,rhonchi or crepitation. No use of accessory muscles of respiration.  CARDIOVASCULAR: S1, S2 normal. No murmurs, rubs, or gallops.  ABDOMEN: Soft, nontender, nondistended. Bowel sounds present. No organomegaly or mass.  EXTREMITIES: Upper and lower extremities are atraumatic. No swelling or erythema. Muscle strength is 5/5 bilaterally. Capillary refill> 3 seconds in all extremities. Pulses palpable distally. NEUROLOGIC:The patient is awake, alert and oriented  x 3 with normal speech. Sensation is intact bilaterally. Cranial nerves are intact.  Gait not checked.  PSYCHIATRIC: Appropriate mood and affect.  SKIN: No obvious rash, lesion, or ulcer.   Labs/imaging that I  havepersonally reviewed  (right click and "Reselect all SmartList Selections" daily)     Labs   CBC: Recent Labs  Lab 09/10/22 1329 09/10/22 1720 09/10/22 1959 09/11/22 0444 09/12/22 0430  WBC 10.6* 10.2  --  11.4* 10.6*  NEUTROABS 7.8*  --   --   --   --   HGB 13.7 13.8 13.4 12.9 11.7*  HCT 43.1 44.1 43.3 41.2 37.9  MCV 90.0 89.8  --  91.8 91.5  PLT 246 229  --  200 175     Basic Metabolic Panel: Recent Labs  Lab 09/10/22 1329 09/10/22 1720 09/10/22 1857 09/11/22 0444 09/11/22 1057 09/12/22 0430  NA 137 137  --  136  --  134*  K 3.4* 3.5  --  3.3*  --  4.3  CL 97* 98  --  100  --  100  CO2 27 27  --  26  --  27  GLUCOSE 132* 119*  --  109*  --  106*  BUN 20 20  --  23  --  24*  CREATININE 1.81* 1.97*  --  2.16*  --  1.47*  CALCIUM 9.1 9.3  --  8.6*  --  8.6*  MG  --   --  2.5*  --   --   --   PHOS  --   --  1.9*  --  2.2*  --     GFR: Estimated Creatinine Clearance: 42 mL/min (A) (by C-G formula based on SCr of 1.47 mg/dL (H)). Recent Labs  Lab 09/10/22 1329 09/10/22 1720 09/11/22 0436 09/11/22 0444 09/12/22 0430  PROCALCITON  --   --  0.32  --  0.34  WBC 10.6* 10.2  --  11.4* 10.6*     Liver Function Tests: Recent Labs  Lab 09/10/22 1329 09/10/22 1720  AST 31 33  ALT 17 17  ALKPHOS 193* 203*  BILITOT 1.8* 2.3*  PROT 8.1 8.5*  ALBUMIN 4.0 4.2    No results for input(s): "LIPASE", "AMYLASE" in the last 168 hours. No results for input(s): "AMMONIA" in the last 168 hours.  ABG No results found for: "PHART", "PCO2ART", "PO2ART", "HCO3", "TCO2", "ACIDBASEDEF", "O2SAT"   Coagulation Profile: Recent Labs  Lab 09/10/22 1329 09/10/22 1720 09/10/22 2152 09/11/22 0444 09/12/22 0430  INR 10.1* >10.0* 1.4* 1.3* 1.2     Cardiac Enzymes: No results for input(s): "CKTOTAL", "CKMB", "CKMBINDEX", "TROPONINI" in the last 168 hours.  HbA1C: Hgb A1c MFr Bld  Date/Time Value Ref Range Status  08/20/2022 01:50 PM 6.2 (H) 4.8 - 5.6 % Final     Comment:    (NOTE)         Prediabetes: 5.7 - 6.4         Diabetes: >6.4         Glycemic control for adults with diabetes: <7.0   12/14/2019 09:09 AM 5.9 (H) 4.8 - 5.6 % Final    Comment:             Prediabetes: 5.7 - 6.4          Diabetes: >6.4          Glycemic control for adults with diabetes: <7.0     CBG: No results for input(s): "GLUCAP" in the last 168 hours.  Review of Systems:    10 point ROS done and is negative except as per HPI and dyspnea   Past Medical History  She,  has a past medical history of AICD (automatic cardioverter/defibrillator) present, Anterior  myocardial infarction [facility]), Arthritis, Cancer of upper lobe of right lung (HCC) (09/2017), Cervical cancer (HCC), Chronic combined systolic and diastolic CHF (congestive heart failure) (HCC), Coronary artery disease, Essential hypertension, Ischemic cardiomyopathy, Mass of upper lobe of right lung, Mitral valve disease, and Personal history of radiation therapy.   Surgical History    Past Surgical History:  Procedure Laterality Date   APPENDECTOMY  1998   BREAST EXCISIONAL BIOPSY Left 80s   benign   CARDIAC CATHETERIZATION     CARDIAC DEFIBRILLATOR PLACEMENT     COLONOSCOPY WITH PROPOFOL N/A 01/11/2020   Procedure: COLONOSCOPY WITH PROPOFOL;  Surgeon: Wyline Mood, MD;  Location: [facility] ENDOSCOPY;  Service: Gastroenterology;  Laterality: N/A;   IR IMAGING GUIDED PORT INSERTION  06/29/2022   PORTA CATH INSERTION     RADICAL HYSTERECTOMY  1998   REPLACEMENT TOTAL KNEE Right 12/10/2021   SHOULDER SURGERY Bilateral    rotator cuff tears   VALVE REPLACEMENT     Mitral valve; 27 mm [employer] Jude bileaflet valve     Social History   reports that she quit smoking about 26 years ago. Her smoking use included cigarettes. She has a 4.00 pack-year smoking history. She has been exposed to tobacco smoke. She has never used smokeless tobacco. She reports that she does not drink alcohol and does not use drugs.   Family  History   Her family history includes Breast cancer in her maternal aunt and maternal aunt; Colon cancer in her maternal uncle; Diabetes in her maternal grandmother and mother; Emphysema in her father; Heart attack in her mother; Hypertension in her mother; Kidney disease in her maternal grandmother.   Allergies No Known Allergies   Home Medications  Prior to Admission medications   Medication Sig Start Date End Date Taking? Authorizing Provider  aspirin 81 MG chewable tablet Chew 81 mg by mouth daily.   Yes [provider]  atorvastatin (LIPITOR) 80 MG tablet TAKE 1 TABLET BY MOUTH ONCE DAILY. OFFICE VISIT AND LABS NEEDED FOR FURTHER REFILLS. 08/31/22  Yes Bacigalupo, Marzella Schlein, MD  ENTRESTO 24-26 MG TAKE 1 TABLET BY MOUTH TWICE DAILY *SCHEDULE OFFICE VISIT FOR FURTHER REFILLS* 08/02/22  Yes End, Cristal Deer, MD  ezetimibe (ZETIA) 10 MG tablet Take 1 tablet (10 mg total) by mouth daily. 07/12/22  Yes End, Cristal Deer, MD  furosemide (LASIX) 40 MG tablet Take 2 tablets (80 mg total) by mouth 2 (two) times daily. 01/21/22  Yes End, Cristal Deer, MD  gabapentin (NEURONTIN) 300 MG capsule Take 1 capsule (300 mg total) by mouth 2 (two) times daily. 10/26/21  Yes Bacigalupo, Marzella Schlein, MD  lidocaine-prilocaine (EMLA) cream Apply to affected area once 06/25/22  Yes Creig Hines, MD  metoprolol succinate (TOPROL-XL) 25 MG 24 hr tablet TAKE 1 TABLET BY MOUTH TWICE DAILY IN THE MORNING AND AT BEDTIME 06/21/22  Yes End, Cristal Deer, MD  ondansetron (ZOFRAN) 8 MG tablet Take 1 tablet (8 mg total) by mouth every 8 (eight) hours as needed for nausea or vomiting. 06/25/22  Yes Creig Hines, MD  oxyCODONE (OXY IR/ROXICODONE) 5 MG immediate release tablet Take 1 tablet (5 mg total) by mouth every 4 (four) hours as needed for severe pain. 09/10/22  Yes Creig Hines, MD  prochlorperazine (COMPAZINE) 10 MG tablet Take 1 tablet (10 mg total) by mouth every 6 (six) hours as needed for nausea or vomiting.  06/25/22  Yes Creig Hines, MD  spironolactone (ALDACTONE) 25 MG tablet Take 1 tablet (25 mg total) by  mouth daily. 04/27/22  Yes Caro Laroche, DO  warfarin (COUMADIN) 5 MG tablet 5 mg daily except 7.5 mg on Mon and Fri Patient taking differently: Take 5 mg by mouth every evening. 05/14/22  Yes Drubel, Lillia Abed, PA-C  metolazone (ZAROXOLYN) 2.5 MG tablet Take 1 tablet (2.5 mg) by mouth once a week. Take 30 minutes prior to your furosemide dose. Patient not taking: Reported on 09/10/2022 05/20/22   End, Cristal Deer, MD  oxybutynin (DITROPAN-XL) 10 MG 24 hr tablet Take 1 tablet (10 mg total) by mouth daily. Patient not taking: Reported on 09/10/2022 06/21/22   Alfredo Martinez, MD  potassium chloride SA (KLOR-CON M) 20 MEQ tablet Take 2 tablets (40 mEq total) by mouth daily. Patient not taking: Reported on 09/10/2022 05/20/22   End, Cristal Deer, MD  Scheduled Meds:  atorvastatin  80 mg Oral Daily   Chlorhexidine Gluconate Cloth  6 each Topical Daily   ezetimibe  10 mg Oral Daily   feeding supplement  237 mL Oral BID BM   gabapentin  300 mg Oral BID   midodrine  10 mg Oral TID WC   multivitamin with minerals  1 tablet Oral Daily   Warfarin - Pharmacist Dosing Inpatient   Does not apply q1600   Continuous Infusions:  sodium chloride Stopped (09/11/22 0721)   heparin 1,000 Units/hr (09/12/22 0800)   PRN Meds:.acetaminophen **OR** acetaminophen, ondansetron **OR** ondansetron (ZOFRAN) IV, oxyCODONE, senna-docusate   Active Hospital Problem list     Assessment & Plan:  #Acute on Chronic Respiratory Failure #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  Now presenting with hemoptysis and shortness of breath. Repeat CT doesn't show any dense consolidations to suggest superimposed bacterial pneumonia.  However, there progressive lung  cancer with large multi chelated right pleural effusion which appears to have increase in size with significant compressive atelectasis in the right middle and lower lobes and additional new and enlarging left lung pulmonary nodules consistent with progressive metastatic disease.  -Supplemental O2 as needed to maintain O2 saturations 88 to 92% -Follow intermittent ABG and chest x-ray as needed -As needed bronchodilators -IR consult for thoracentesis, Last thoracentesis on 12/28 yielded 1.4 L -Palliative care consult  # Hemoptysis in the setting of supra therapeutic INR INR on admit>10 S/p Kcentral and Vitamin K Current INR -Hold anticoagulation for now pending possible thoracentesis as above -Follow PT/INR  Hypotension concern for Sepsis -F/u cultures, trend lactic/ PCT -Monitor WBC/ fever curve -Hold empiric IV antibiotics for now  -Pressors for MAP goal >65, has not required pressors -Strict I/O's   # AKI likely ATN in the setting of above # Hypokalemia -Monitor I&O's / urinary output -Follow BMP -Ensure adequate renal perfusion -Avoid nephrotoxic agents as able -Replace electrolytes as indicated   #Chronic HFrEF (LVEF 25-30% with global hypokinesis ) #Hypertension #Atrial Fibrillation Hx: CAD s/p LHC (2016), STEMI, Ischemic Cardiomyopathy, HLD  -Continuous cardiac monitoring -Maintain MAP greater than 65 -IV Lasix as blood pressure and renal function permits; currently on Lasix 40 mg BID -Hold metoprolol, Entresto, and spironolactone for now in the setting of hypotension and AKI -Hold Coumadin due to supratherapeutic INR -Continue aspirin 81 mg and atorvastatin 80 mg/day -Consider cardiology consult -Repeat 2D Echocardiogram     Best practice:  Diet:  Oral Pain/Anxiety/Delirium protocol (if indicated): No VAP protocol (if indicated): Not indicated DVT prophylaxis: Contraindicated GI prophylaxis: PPI Glucose  control:  SSI No Central venous access:  N/A Arterial  line:  N/A Foley:  N/A Mobility:  bed rest  PT consulted: N/A Last date of multidisciplinary goals of care discussion [1/12] Code Status:  full code Disposition: Stepdown   = Goals of Care = Code Status Order: FULL  Primary Emergency Contact: Macintyre,Tosin, Home Phone: [phone] Wishes to pursue full aggressive treatment and intervention options, including CPR and intubation, but goals of care will be addressed on going with family if that should become necessary.   Critical care provider statement:   Total critical care time: 33 minutes   Performed by: Karna Christmas MD   Critical care time was exclusive of separately billable procedures and treating other patients.   Critical care was necessary to treat or prevent imminent or life-threatening deterioration.   Critical care was time spent personally by me on the following activities: development of treatment plan with patient and/or surrogate as well as nursing, discussions with consultants, evaluation of patient's response to treatment, examination of patient, obtaining history from patient or surrogate, ordering and performing treatments and interventions, ordering and review of laboratory studies, ordering and review of radiographic studies, pulse oximetry and re-evaluation of patient's condition.    Vida Rigger, M.D.  Pulmonary & Critical Care Medicine

## 2022-09-12 NOTE — Progress Notes (Addendum)
PROGRESS NOTE   Vanessa Carlson  WGN:[id number]    DOB: [birth year]    DOA: 09/10/2022  PCP: Virginia Crews, MD   I have briefly reviewed patients previous medical records in [facility].  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  In ED, INR >10, hemodynamically stable and not hypoxic.  CT C/A/P without contrast showing progressive RUL mass/lung cancer, large multiloculated right pleural effusion with compressive atelectasis, progressive metastatic lung disease and possible liver mets.  She was given a dose of Kcentra and vitamin K 10 mg on 1/12 and admitted to ICU.  PCCM was consulted.  No further hemoptysis.  INR down to 1.3.  Initiating IV heparin with close monitoring due to mechanical MVR.  Cardiology consulted.  IR consulted with plans for thoracentesis 1/15.   Assessment & Plan:  Principal Problem:   Supratherapeutic INR Active Problems:   HTN (hypertension)   Coronary artery disease involving native coronary artery of native heart without angina pectoris   Paroxysmal atrial fibrillation (HCC)   Chronic anticoagulation   Morbid obesity (HCC)   Hyperlipidemia LDL goal <70   Primary lung adenocarcinoma, right (HCC)   Pleural effusion   Hypotension   AKI (acute kidney injury) ([place])   Elevated bilirubin   Hemoptysis/supratherapeutic INR: Secondary to supratherapeutic INR in the context of progressive stage IV lung cancer.   Elevated INR felt to be due to poor oral intake.  S/p Kcentra and IV vitamin K 10 mg on 1/12.  INR down from >10-1.3.  Reports only a single episode of handful of hemoptysis on 1/11 and none before that and no recurrence since hospital admission.  She is on anticoagulation for mechanical MVR and A-fib.  She remains at high risk for recurrent hemoptysis from her underlying lung cancer.  At the same time, also at high risk for clotting of her mechanical valve.  This was discussed in detail with patient, risks and benefits of anticoagulation including catastrophic bleeding risks and even death versus no anticoagulation and severe thromboembolic complications.  She agreed to initiating of IV heparin.  Warfarin had been started by cardiology but currently on hold for procedures.  Based on chart review, it appears that palliative care medicine is already on board as outpatient.  INR down to 1.2.  Hemoglobin relatively stable.  Acute kidney injury: Serum creatinine 0.94 on 12/1.  Since then has progressively increased, 1.81 on admission and peaked to 2.16.  No recent contrast for CT.  May be related to hypotension/ATN.  Avoid nephrotoxic's.  Creatinine improved to 1.47.  Hypokalemia:  Replaced.  Magnesium 2.5.  Hypophosphatemia Phosphorus 1.9 >2.1.  Replace and follow as needed  Hypotension: Unclear etiology.?  Poor oral intake and meds, now on hold (Entresto, metoprolol and spironolactone).  S/p 1 L IV fluid bolus.  Midodrine 10 Mg 3 times daily has been initiated.  Blood pressures have improved, latest 113/85.  Cardiology of initiated Toprol-XL 25 Mg daily.  Large multiloculated right pleural effusion: Last had thoracentesis on 12/28.  Today patient  stated that she is slightly more dyspneic than her baseline. As per Dr. Karna Christmas, he has d/w IR who plan thoracentesis on 1/15  Hyperlipidemia: Continue atorvastatin  Paroxysmal atrial fibrillation Currently appears to be in sinus rhythm.   Anticoagulation discussion as above.  High risk anticoagulation candidate.  Noted some tachycardia on chart review.  Telemetry reviewed this morning had shown sinus rhythm.  Cardiology has restarted Toprol-XL at 25 Mg daily.  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  Essential hypertension: Hypotensive on admission.  Antihypertensives and diuretics were held.  Gradually being initiated including Toprol-XL today.  Chronic systolic CHF/cardiomyopathy: Clinically euvolemic.  TTE : LVEF 20-25%, unchanged from prior.  Lasix on hold.  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  Cardiology consulted to opine regarding anticoagulation.  Anticoagulation discussion as noted above.  Goal INR 2.5-3.5.  Stage IV adenocarcinoma of lung with malignant right pleural effusion, pulmonary metastasis and possible liver mets. Outpatient follow-up with [name], oncology and palliative care medicine  Chronic low back pain Controlled on oxycodone.  Discussed with RN regarding mobilizing/out of bed to chair.  Body mass index is 37.3 kg/m./Obesity    DVT prophylaxis: Place TED hose Start: 09/10/22 1856     Code Status: Full Code:  Family Communication: Unable to reach patient's spouse or daughter via phone, left a VM message for the daughter to call back. Was able to then d/w spouse. Disposition:  Status is: Inpatient Remains inpatient appropriate because: Concern for recurrent hemoptysis while IV heparin is being initiated.  Awaiting therapeutic right thoracentesis     Consultants:   Pulmonology Cardiology Interventional radiology  Procedures:     Antimicrobials:      Subjective:  No further hemoptysis.  Chronic low back pain.  Dyspnea somewhat worse than her baseline.  Objective:   Vitals:   09/12/22 0737 09/12/22 0800 09/12/22 1000 09/12/22 1226  BP: (!) 93/54 108/63 (!) 102/59 113/85  Pulse:  (!) 110 (!) 110 (!) 114   Resp:  19 (!) 23   Temp: 97.7 F (36.5 C)     TempSrc: Oral     SpO2:  94% 93%   Weight:      Height:        General exam: Young female, moderately built and obese lying comfortably propped up in bed without distress. Respiratory system: Markedly reduced breath sounds in the right lung fields especially in the mid and lower zones.  Slight breath sounds heard in the upper lung fields.  Left side clear to auscultation.  No increased work of breathing. Cardiovascular system: S1 & S2 heard, RRR. No JVD, murmurs, rubs, gallops or clicks. No pedal edema.  Telemetry personally reviewed this morning: Sinus rhythm. Gastrointestinal system: Abdomen is nondistended, soft and nontender. No organomegaly or masses felt. Normal bowel sounds heard. Central nervous system: Alert and oriented. No focal neurological deficits. Extremities: Symmetric 5 x 5 power. Skin: No rashes, lesions or ulcers Psychiatry: Judgement and insight appear normal. Mood & affect appropriate.     Data Reviewed:   I have personally reviewed following labs and imaging studies   CBC: Recent Labs  Lab 09/10/22 1329 09/10/22 1720 09/10/22 1959 09/11/22 0444 09/12/22 0430  WBC 10.6* 10.2  --  11.4* 10.6*  NEUTROABS 7.8*  --   --   --   --   HGB 13.7 13.8 13.4 12.9 11.7*  HCT 43.1 44.1 43.3 41.2 37.9  MCV 90.0 89.8  --  91.8 91.5  PLT 246 229  --  200 175    Basic Metabolic Panel: Recent Labs  Lab 09/10/22 1329 09/10/22 1720 09/10/22 1857 09/11/22 0444 09/11/22 1057 09/12/22 0430  NA 137 137  --  136  --  134*  K 3.4* 3.5  --  3.3*  --  4.3  CL 97* 98  --  100  --  100  CO2 27 27  --  26  --  27  GLUCOSE 132* 119*  --  109*  --  106*  BUN 20 20  --  23  --  24*  CREATININE 1.81* 1.97*  --  2.16*  --  1.47*  CALCIUM 9.1 9.3  --  8.6*  --  8.6*  MG  --   --  2.5*  --   --   --   PHOS  --   --  1.9*  --  2.2* 2.1*    Liver Function Tests: Recent Labs  Lab 09/10/22 1329 09/10/22 1720  AST 31 33  ALT  17 17  ALKPHOS 193* 203*  BILITOT 1.8* 2.3*  PROT 8.1 8.5*  ALBUMIN 4.0 4.2    CBG: No results for input(s): "GLUCAP" in the last 168 hours.  Microbiology Studies:   Recent Results (from the past 240 hour(s))  MRSA Next Gen by PCR, Nasal     Status: None   Collection Time: 09/11/22  3:24 AM   Specimen: Nasal Mucosa; Nasal Swab  Result Value Ref Range Status   MRSA by PCR Next Gen NOT DETECTED NOT DETECTED Final    Comment: (NOTE) The GeneXpert MRSA Assay (FDA approved for NASAL specimens only), is one component of a comprehensive MRSA colonization surveillance program. It is not intended to diagnose MRSA infection nor to guide or monitor treatment for MRSA infections. Test performance is not FDA approved in patients less than 31 years old. Performed at [facility], [address] [place], [place] [address]   Culture, blood (Routine X 2) w Reflex to ID Panel     Status: None (Preliminary result)   Collection Time: 09/11/22  7:30 AM   Specimen: BLOOD  Result Value Ref Range Status   Specimen Description BLOOD BLOOD LEFT ARM  Final   Special Requests   Final    BOTTLES DRAWN AEROBIC ONLY Blood Culture results may not be optimal due to an inadequate volume of blood received in culture bottles   Culture   Final    NO GROWTH < 24 HOURS Performed at [facility], [address], [place], [place] [address]    Report Status PENDING  Incomplete  Culture, blood (Routine X 2) w Reflex to ID Panel     Status: None (Preliminary result)   Collection Time: 09/11/22  7:44 AM   Specimen: BLOOD  Result Value Ref Range Status   Specimen Description BLOOD BLOOD RIGHT HAND  Final   Special Requests   Final    BOTTLES DRAWN AEROBIC AND ANAEROBIC Blood Culture adequate volume   Culture   Final    NO GROWTH < 24 HOURS Performed at [facility], [address], [place], [place] [address]    Report Status PENDING  Incomplete    Radiology Studies:   ECHOCARDIOGRAM COMPLETE  Result Date: 09/11/2022    ECHOCARDIOGRAM REPORT   Patient Name:   JURNEY OVERACKER Date of Exam: 09/11/2022 Medical Rec #:  [id number]         Height:  62.0 in Accession #:    [id number]        Weight:       203.9 lb Date of Birth:  1960/10/02          BSA:          1.928 m Patient Age:    61 years          BP:           93/62 mmHg Patient Gender: F                 HR:           83 bpm. Exam Location:  [facility] Procedure: 2D Echo and Intracardiac Opacification Agent Indications:     CHF I50.21  History:         Patient has prior history of Echocardiogram examinations, most                  recent 01/28/2022.                   Mitral Valve: 27 mm mechanical valve valve is present in the                  mitral position.  Sonographer:     Overton Mam RDCS Referring Phys:  [id number] Ronnald Ramp O'NEAL Diagnosing Phys: Lennie Odor MD  Sonographer Comments: Technically difficult study due to poor echo windows and suboptimal subcostal window. Image acquisition challenging due to patient body habitus. IMPRESSIONS  1. Left ventricular ejection fraction, by estimation, is 20 to 25%. The left ventricle has severely decreased function. The left ventricle demonstrates regional wall motion abnormalities (see scoring diagram/findings for description). Left ventricular diastolic function could not be evaluated.  2. Right ventricular systolic function is normal. The right ventricular size is normal. There is normal pulmonary artery systolic pressure. The estimated right ventricular systolic pressure is 31.7 mmHg.  3. The mitral valve has been repaired/replaced. No evidence of mitral valve regurgitation. The mean mitral valve gradient is 2.0 mmHg with average heart rate of 85 bpm. There is a 27 mm mechanical valve present in the mitral position. Echo findings are consistent with normal structure and function of the mitral valve prosthesis.  4. The aortic valve is tricuspid. Aortic valve  regurgitation is not visualized. No aortic stenosis is present.  5. The inferior vena cava is normal in size with greater than 50% respiratory variability, suggesting right atrial pressure of 3 mmHg. Comparison(s): No significant change from prior study. Conclusion(s)/Recommendation(s): Findings consistent with ischemic cardiomyopathy. No left ventricular mural or apical thrombus/thrombi. FINDINGS  Left Ventricle: Left ventricular ejection fraction, by estimation, is 20 to 25%. The left ventricle has severely decreased function. The left ventricle demonstrates regional wall motion abnormalities. Definity contrast agent was given IV to delineate the left ventricular endocardial borders. The left ventricular internal cavity size was normal in size. There is no left ventricular hypertrophy. Left ventricular diastolic function could not be evaluated due to mitral valve replacement. Left ventricular  diastolic function could not be evaluated.  LV Wall Scoring: The mid and distal anterior septum, apical lateral segment, apical anterior segment, and apex are akinetic. The anterior wall, antero-lateral wall, entire inferior wall, posterior wall, basal anteroseptal segment, mid inferoseptal segment, and basal inferoseptal segment are hypokinetic. Right Ventricle: The right ventricular size is normal. No increase in right ventricular wall thickness. Right ventricular systolic function is normal. There is normal pulmonary artery systolic pressure. The tricuspid regurgitant velocity  is 2.68 m/s, and  with an assumed right atrial pressure of 3 mmHg, the estimated right ventricular systolic pressure is 31.7 mmHg. Left Atrium: Left atrial size was normal in size. Right Atrium: Right atrial size was normal in size. Pericardium: There is no evidence of pericardial effusion. Mitral Valve: The mitral valve has been repaired/replaced. No evidence of mitral valve regurgitation. There is a 27 mm mechanical valve present in the mitral  position. Echo findings are consistent with normal structure and function of the mitral valve prosthesis. MV peak gradient, 4.8 mmHg. The mean mitral valve gradient is 2.0 mmHg with average heart rate of 85 bpm. Tricuspid Valve: The tricuspid valve is grossly normal. Tricuspid valve regurgitation is mild . No evidence of tricuspid stenosis. Aortic Valve: The aortic valve is tricuspid. Aortic valve regurgitation is not visualized. No aortic stenosis is present. Aortic valve peak gradient measures 6.2 mmHg. Pulmonic Valve: The pulmonic valve was grossly normal. Pulmonic valve regurgitation is mild. No evidence of pulmonic stenosis. Aorta: The aortic root and ascending aorta are structurally normal, with no evidence of dilitation. Venous: The inferior vena cava is normal in size with greater than 50% respiratory variability, suggesting right atrial pressure of 3 mmHg. IAS/Shunts: The atrial septum is grossly normal. Additional Comments: A device lead is visualized in the right atrium and right ventricle.  LEFT VENTRICLE PLAX 2D LVIDd:         5.20 cm     Diastology LVIDs:         4.70 cm     LV e' medial:    6.53 cm/s LV PW:         0.80 cm     LV E/e' medial:  11.6 LV IVS:        0.80 cm     LV e' lateral:   8.16 cm/s LVOT diam:     1.65 cm     LV E/e' lateral: 9.3 LV SV:         28 LV SV Index:   14 LVOT Area:     2.14 cm  LV Volumes (MOD) LV vol d, MOD A2C: 98.8 ml LV vol d, MOD A4C: 94.4 ml LV vol s, MOD A2C: 49.1 ml LV vol s, MOD A4C: 63.6 ml LV SV MOD A2C:     49.8 ml LV SV MOD A4C:     94.4 ml LV SV MOD BP:      42.9 ml RIGHT VENTRICLE RV Basal diam:  2.20 cm LEFT ATRIUM             Index LA diam:        3.50 cm 1.82 cm/m LA Vol (A2C):   82.2 ml 42.64 ml/m LA Vol (A4C):   30.2 ml 15.67 ml/m LA Biplane Vol: 51.7 ml 26.82 ml/m  AORTIC VALVE                 PULMONIC VALVE AV Area (Vmax): 1.28 cm     PV Vmax:          0.90 m/s AV Vmax:        124.00 cm/s  PV Peak grad:     3.2 mmHg AV Peak Grad:   6.2 mmHg      PR End Diast Vel: 5.20 msec LVOT Vmax:      74.20 cm/s LVOT Vmean:     46.800 cm/s LVOT VTI:       0.130 m  AORTA Ao Root diam: 2.70 cm Ao Asc  diam:  2.40 cm MITRAL VALVE                TRICUSPID VALVE MV Area (PHT): 3.27 cm     TR Peak grad:   28.7 mmHg MV Area VTI:   1.27 cm     TR Vmax:        268.00 cm/s MV Peak grad:  4.8 mmHg MV Mean grad:  2.0 mmHg     SHUNTS MV Vmax:       1.10 m/s     Systemic VTI:  0.13 m MV Vmean:      55.7 cm/s    Systemic Diam: 1.65 cm MV Decel Time: 232 msec MV E velocity: 75.50 cm/s MV A velocity: 112.00 cm/s MV E/A ratio:  0.67 Lennie Odor MD Electronically signed by Lennie Odor MD Signature Date/Time: 09/11/2022/5:40:25 PM    Final    CT CHEST ABDOMEN PELVIS WO CONTRAST  Result Date: 09/10/2022 CLINICAL DATA:  Non-small cell lung cancer, elevated INR EXAM: CT CHEST, ABDOMEN AND PELVIS WITHOUT CONTRAST TECHNIQUE: Multidetector CT imaging of the chest, abdomen and pelvis was performed following the standard protocol without IV contrast. Examination was performed without intravenous contrast at the request of the ordering physician. Evaluation of the soft tissues, vascular structures, and solid viscera is limited. RADIATION DOSE REDUCTION: This exam was performed according to the departmental dose-optimization program which includes automated exposure control, adjustment of the mA and/or kV according to patient size and/or use of iterative reconstruction technique. COMPARISON:  05/21/2022, 06/10/2022 FINDINGS: CT CHEST FINDINGS Cardiovascular: Stable cardiac pacer. Mitral valve prosthesis. No pericardial effusion. Normal caliber of the thoracic aorta. Stable atherosclerosis of the aorta and coronary vasculature. Mediastinum/Nodes: Thyroid, trachea, and esophagus are grossly unremarkable. 9 mm short axis pretracheal lymph node reference image 17/2, previously measuring approximately 12 mm. Evaluation of the mediastinum and hilar regions is limited due to the lack of intravenous  contrast. Lungs/Pleura: Right upper lobe mass measures 6.9 x 5.0 cm reference image 15/2, previously measuring 6.4 x 4.6 cm, compatible with recurrent right upper lobe lung cancer. There is a large multiloculated right pleural effusion. Compressive atelectasis is seen within the right middle and right lower lobes, with minimal aeration superior segment right lower lobe. There is increase in the size and number of left lung pulmonary nodules seen on prior study, consistent with progressive metastatic disease. Index nodule left upper lobe image 34/4 measures 12 x 8 mm, previously 6 x 5 mm. No left-sided effusion. No pneumothorax. Musculoskeletal: Stable right anterolateral second and third rib fractures. No new bony abnormalities. Reconstructed images demonstrate no additional findings. CT ABDOMEN PELVIS FINDINGS Hepatobiliary: Gallbladder sludge layering dependently within the gallbladder. No calcified gallstones or cholecystitis. Indeterminate 8 mm hypodensity inferior right lobe liver image 59/2, metastatic disease cannot be excluded. No biliary duct dilation. Pancreas: Unremarkable unenhanced appearance. Spleen: Unremarkable unenhanced appearance. Adrenals/Urinary Tract: No urinary tract calculi or obstructive uropathy. The adrenals and bladder are grossly unremarkable. Stomach/Bowel: No bowel obstruction or ileus. No bowel wall thickening or inflammatory change. Vascular/Lymphatic: Aortic atherosclerosis. No enlarged abdominal or pelvic lymph nodes. Reproductive: Prior hysterectomy.  No adnexal masses. Other: No free fluid or free intraperitoneal gas. No abdominal wall hernia. Musculoskeletal: No acute or destructive bony lesions. Reconstructed images demonstrate no additional findings. IMPRESSION: 1. Enlarging right upper lobe mass consistent with progressive lung cancer. 2. Large multiloculated right pleural effusion, increased in size since prior study. Significant compressive atelectasis within the right  middle and right lower lobe, with  only a small portion of normally aerated lung within the superior segment right lower lobe. 3. New and enlarging left lung pulmonary nodules consistent with progressive metastatic disease. 4. Indeterminate 8 mm hypodensity inferior right lobe liver, not well seen on previous exams. Metastatic disease cannot be excluded. 5. Grossly stable borderline enlarged mediastinal adenopathy. 6. Gallbladder sludge. No evidence of cholelithiasis or cholecystitis. 7.  Aortic Atherosclerosis (ICD10-I70.0). Electronically Signed   By: Sharlet Salina M.D.   On: 09/10/2022 17:50    Scheduled Meds:    atorvastatin  80 mg Oral Daily   Chlorhexidine Gluconate Cloth  6 each Topical Daily   ezetimibe  10 mg Oral Daily   feeding supplement  237 mL Oral BID BM   gabapentin  300 mg Oral BID   metoprolol succinate  25 mg Oral Daily   midodrine  10 mg Oral TID WC   multivitamin with minerals  1 tablet Oral Daily   potassium & sodium phosphates  2 packet Oral Q4H   warfarin  7.5 mg Oral ONCE-1600   Warfarin - Pharmacist Dosing Inpatient   Does not apply q1600    Continuous Infusions:    sodium chloride Stopped (09/11/22 0721)   heparin 1,000 Units/hr (09/12/22 1321)     LOS: 2 days     Marcellus Scott, MD,  FACP, FHM, [facility], [facility], [facility]   [facility] [place]     To contact the attending provider between 7A-7P or the covering provider during after hours 7P-7A, please log into the web site [email or web address] and access using universal [place] password for that web site. If you do not have the password, please call the hospital operator.  09/12/2022, 2:47 PM

## 2022-09-13 ENCOUNTER — Inpatient Hospital Stay: Payer: Medicare HMO

## 2022-09-13 ENCOUNTER — Encounter: Payer: Self-pay | Admitting: Oncology

## 2022-09-13 ENCOUNTER — Other Ambulatory Visit: Payer: Self-pay | Admitting: Internal Medicine

## 2022-09-13 DIAGNOSIS — C3491 Malignant neoplasm of unspecified part of right bronchus or lung: Secondary | ICD-10-CM

## 2022-09-13 DIAGNOSIS — R042 Hemoptysis: Secondary | ICD-10-CM | POA: Diagnosis not present

## 2022-09-13 DIAGNOSIS — J910 Malignant pleural effusion: Secondary | ICD-10-CM | POA: Diagnosis not present

## 2022-09-13 DIAGNOSIS — R791 Abnormal coagulation profile: Secondary | ICD-10-CM | POA: Diagnosis not present

## 2022-09-13 LAB — RENAL FUNCTION PANEL
Albumin: 3.3 g/dL — ABNORMAL LOW (ref 3.5–5.0)
Anion gap: 5 (ref 5–15)
BUN: 16 mg/dL (ref 8–23)
CO2: 29 mmol/L (ref 22–32)
Calcium: 8.5 mg/dL — ABNORMAL LOW (ref 8.9–10.3)
Chloride: 101 mmol/L (ref 98–111)
Creatinine, Ser: 1.14 mg/dL — ABNORMAL HIGH (ref 0.44–1.00)
GFR, Estimated: 54 mL/min — ABNORMAL LOW (ref >60)
Glucose, Bld: 98 mg/dL (ref 70–99)
Phosphorus: 1.7 mg/dL — ABNORMAL LOW (ref 2.5–4.6)
Potassium: 3.9 mmol/L (ref 3.5–5.1)
Sodium: 135 mmol/L (ref 135–145)

## 2022-09-13 LAB — CBC
HCT: 34.8 % — ABNORMAL LOW (ref 36.0–46.0)
Hemoglobin: 10.9 g/dL — ABNORMAL LOW (ref 12.0–15.0)
MCH: 28.5 pg (ref 26.0–34.0)
MCHC: 31.3 g/dL (ref 30.0–36.0)
MCV: 91.1 fL (ref 80.0–100.0)
Platelets: 178 10*3/uL (ref 150–400)
RBC: 3.82 MIL/uL — ABNORMAL LOW (ref 3.87–5.11)
RDW: 17.2 % — ABNORMAL HIGH (ref 11.5–15.5)
WBC: 9.3 10*3/uL (ref 4.0–10.5)
nRBC: 0 % (ref 0.0–0.2)

## 2022-09-13 LAB — PROCALCITONIN: Procalcitonin: 0.27 ng/mL

## 2022-09-13 LAB — PROTIME-INR
INR: 1.5 — ABNORMAL HIGH (ref 0.8–1.2)
Prothrombin Time: 17.7 seconds — ABNORMAL HIGH (ref 11.4–15.2)

## 2022-09-13 LAB — HEPARIN LEVEL (UNFRACTIONATED): Heparin Unfractionated: 0.67 IU/mL (ref 0.30–0.70)

## 2022-09-13 LAB — MAGNESIUM: Magnesium: 2.4 mg/dL (ref 1.7–2.4)

## 2022-09-13 MED ORDER — WARFARIN SODIUM 7.5 MG PO TABS
7.5000 mg | ORAL_TABLET | Freq: Once | ORAL | Status: AC
  Administered 2022-09-13: 7.5 mg via ORAL
  Filled 2022-09-13: qty 1

## 2022-09-13 MED ORDER — SODIUM PHOSPHATES 45 MMOLE/15ML IV SOLN
20.0000 mmol | Freq: Once | INTRAVENOUS | Status: AC
  Administered 2022-09-13: 20 mmol via INTRAVENOUS
  Filled 2022-09-13: qty 6.67

## 2022-09-13 MED ORDER — WARFARIN - PHARMACIST DOSING INPATIENT: Freq: Every day | Status: DC

## 2022-09-13 MED ORDER — LIDOCAINE HCL (PF) 1 % IJ SOLN
10.0000 mL | Freq: Once | INTRAMUSCULAR | Status: AC
  Administered 2022-09-13: 10 mL via INTRADERMAL
  Filled 2022-09-13: qty 10

## 2022-09-13 MED ORDER — HEPARIN (PORCINE) 25000 UT/250ML-% IV SOLN
1000.0000 [IU]/h | INTRAVENOUS | Status: DC
  Administered 2022-09-13 – 2022-09-16 (×4): 1000 [IU]/h via INTRAVENOUS
  Filled 2022-09-13 (×3): qty 250

## 2022-09-13 NOTE — Progress Notes (Signed)
PROGRESS NOTE   Vanessa Carlson  ZOX:[id number]    DOB: 25-Oct-1960    DOA: 09/10/2022  PCP: Virginia Crews, MD   I have briefly reviewed patients previous medical records in [facility].  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  In ED, INR >10, hemodynamically stable and not hypoxic.  CT C/A/P without contrast showing progressive RUL mass/lung cancer, large multiloculated right pleural effusion with compressive atelectasis, progressive metastatic lung disease and possible liver mets.  She was given a dose of Kcentra and vitamin K 10 mg on 1/12 and admitted to ICU.  PCCM was consulted.  No further hemoptysis.  INR down to 1.3.  Initiating IV heparin with close monitoring due to mechanical MVR.  Cardiology consulted.  IR consulted and s/p ultrasound-guided right thoracentesis yielding 1.3 L of dark bloody fluid.   Assessment & Plan:  Principal Problem:   Supratherapeutic INR Active Problems:   HTN (hypertension)   Coronary artery disease involving native coronary artery of native heart without angina pectoris   Paroxysmal atrial fibrillation (HCC)   Chronic anticoagulation   Morbid obesity (HCC)   Hyperlipidemia LDL goal <70   Primary lung adenocarcinoma, right (HCC)   Pleural effusion   Hypotension   AKI (acute kidney injury) ([place])   Elevated bilirubin   Hemoptysis/supratherapeutic INR: Secondary to supratherapeutic INR in the  context of progressive stage IV lung cancer.  Elevated INR felt to be due to poor oral intake.  S/p Kcentra and IV vitamin K 10 mg on 1/12.  INR down from >10-1.3.  Reports only a single episode of handful of hemoptysis on 1/11 and none before that and no recurrence since hospital admission.  She is on anticoagulation for mechanical MVR and A-fib.  She remains at high risk for recurrent hemoptysis from her underlying lung cancer.  At the same time, also at high risk for clotting of her mechanical valve.  This was discussed in detail with patient, risks and benefits of anticoagulation including catastrophic bleeding risks and even death versus no anticoagulation and severe thromboembolic complications.  She agreed to initiating of IV heparin.  Warfarin had been started by cardiology (held on 1/14 for thoracentesis).  Based on chart review, it appears that palliative care medicine is already on board as outpatient.  INR down to 1.2 >1.5.  Hemoglobin relatively stable.  Acute kidney injury: Serum creatinine 0.94 on 12/1.  Since then has progressively increased, 1.81 on admission and peaked to 2.16.  No recent contrast for CT.  May be related to hypotension/ATN.  Avoid nephrotoxic's.  Resolved.  Creatinine down to 1.14.  Hypokalemia:  Replaced.  Magnesium 2.4.  Hypophosphatemia Phosphorus 1.9 >2.1 >1.7.  Replace per pharmacy and follow as needed  Hypotension: Unclear etiology.?  Poor oral intake and meds, now on hold (Entresto, and spironolactone).  S/p 1 L IV fluid bolus.  Midodrine 10 Mg 3 times daily has been initiated.  Blood pressures mostly SBP in the 90s.  Cardiology of initiated Toprol-XL 25 Mg daily.  Large  multiloculated right pleural effusion: Last had thoracentesis on 12/28.  S/p 1.3 L of ultrasound-guided thoracentesis 1/15, dark bloody fluid, suspect malignant pleural effusion.  Hyperlipidemia: Continue atorvastatin  Paroxysmal atrial fibrillation Currently appears to be in sinus  rhythm.  Anticoagulation discussion as above.  High risk anticoagulation candidate.  Cardiology has restarted Toprol-XL at 25 Mg daily.  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  Now that she has completed thoracentesis, may continue IV heparin bridge with warfarin per pharmacy.  Essential hypertension: Hypotensive on admission.  Antihypertensives and diuretics were held.  Gradually being initiated including Toprol-XL today.  Chronic systolic CHF/cardiomyopathy: Clinically euvolemic.  TTE : LVEF 20-25%, unchanged from prior.  Lasix on hold.  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  Cardiology consulted to opine regarding anticoagulation.  Anticoagulation discussion as noted above.  Goal INR 2.5-3.5.  Stage IV adenocarcinoma of lung with malignant right pleural effusion, pulmonary metastasis and possible liver mets. Outpatient follow-up with Dr. Janese Banks, oncology and palliative care medicine  Chronic low back pain Controlled on oxycodone.  Discussed with RN regarding mobilizing/out of bed to chair.  Body mass index is 37.3 kg/m./Obesity    DVT prophylaxis: Place TED hose Start: 09/10/22 1856     Code Status: Full Code:  Family Communication: None at bedside this morning. Disposition:  Inpatient appropriate.  Just had thoracentesis today.  Monitor overnight.     Consultants:   Pulmonology Cardiology Interventional radiology  Procedures:   As above  Antimicrobials:      Subjective:  Seen this morning prior to procedures.  Reported progressive dyspnea, worse with minimal activity.  No hemoptysis since hospital admission.  Objective:   Vitals:   09/13/22 1334 09/13/22 1400 09/13/22 1500 09/13/22 1600  BP: (!) 95/57 (!) 94/58    Pulse: 95 94 94 87  Resp: 20 17 (!) 31 19  Temp:      TempSrc:      SpO2: 99% 100% 91% 99%  Weight:      Height:        General exam: Young female, moderately built  and obese sitting up in bed with some DOE.  Had just been back from toilet. Respiratory system: Markedly reduced breath sounds in the right lung fields especially in the mid and lower zones.  Slight breath sounds heard in the upper lung fields.  Left side clear to auscultation.  No increased work of breathing.  No change compared to yesterday. Cardiovascular system: S1 & S2 heard, RRR. No JVD, murmurs, rubs, gallops or clicks. No pedal edema.  Telemetry personally reviewed: Sinus rhythm with occasional mild sinus tachycardia, low voltage. Gastrointestinal system: Abdomen is nondistended, soft and nontender. No organomegaly or masses felt. Normal bowel sounds heard. Central nervous system: Alert and oriented. No focal neurological deficits. Extremities: Symmetric 5 x 5 power. Skin: No rashes, lesions or ulcers Psychiatry: Judgement and insight appear normal. Mood & affect appropriate.     Data Reviewed:   I have personally reviewed following labs and imaging studies   CBC: Recent Labs  Lab 09/10/22 1329 09/10/22 1720 09/11/22 0444 09/12/22 0430 09/13/22 0437  WBC 10.6*   < > 11.4* 10.6* 9.3  NEUTROABS 7.8*  --   --   --   --   HGB 13.7   < > 12.9 11.7* 10.9*  HCT 43.1   < > 41.2 37.9 34.8*  MCV 90.0   < > 91.8 91.5 91.1  PLT 246   < >  200 175 178   < > = values in this interval not displayed.    Basic Metabolic Panel: Recent Labs  Lab 09/10/22 1329 09/10/22 1720 09/10/22 1857 09/11/22 0444 09/11/22 1057 09/12/22 0430 09/13/22 0437 09/13/22 0500  NA 137 137  --  136  --  134* 135  --   K 3.4* 3.5  --  3.3*  --  4.3 3.9  --   CL 97* 98  --  100  --  100 101  --   CO2 27 27  --  26  --  27 29  --   GLUCOSE 132* 119*  --  109*  --  106* 98  --   BUN 20 20  --  23  --  24* 16  --   CREATININE 1.81* 1.97*  --  2.16*  --  1.47* 1.14*  --   CALCIUM 9.1 9.3  --  8.6*  --  8.6* 8.5*  --   MG  --   --  2.5*  --   --   --   --  2.4  PHOS  --   --  1.9*  --  2.2* 2.1* 1.7*  --      Liver Function Tests: Recent Labs  Lab 09/10/22 1329 09/10/22 1720 09/13/22 0437  AST 31 33  --   ALT 17 17  --   ALKPHOS 193* 203*  --   BILITOT 1.8* 2.3*  --   PROT 8.1 8.5*  --   ALBUMIN 4.0 4.2 3.3*    CBG: Recent Labs  Lab 09/11/22 0307  GLUCAP 109*    Microbiology Studies:   Recent Results (from the past 240 hour(s))  MRSA Next Gen by PCR, Nasal     Status: None   Collection Time: 09/11/22  3:24 AM   Specimen: Nasal Mucosa; Nasal Swab  Result Value Ref Range Status   MRSA by PCR Next Gen NOT DETECTED NOT DETECTED Final    Comment: (NOTE) The GeneXpert MRSA Assay (FDA approved for NASAL specimens only), is one component of a comprehensive MRSA colonization surveillance program. It is not intended to diagnose MRSA infection nor to guide or monitor treatment for MRSA infections. Test performance is not FDA approved in patients less than 85 years old. Performed at [facility], [address] [place], [place] [address]   Culture, blood (Routine X 2) w Reflex to ID Panel     Status: None (Preliminary result)   Collection Time: 09/11/22  7:30 AM   Specimen: BLOOD  Result Value Ref Range Status   Specimen Description BLOOD BLOOD LEFT ARM  Final   Special Requests   Final    BOTTLES DRAWN AEROBIC ONLY Blood Culture results may not be optimal due to an inadequate volume of blood received in culture bottles   Culture   Final    NO GROWTH 2 DAYS Performed at [facility], [address], [place], [place] [address]    Report Status PENDING  Incomplete  Culture, blood (Routine X 2) w Reflex to ID Panel     Status: None (Preliminary result)   Collection Time: 09/11/22  7:44 AM   Specimen: BLOOD  Result Value Ref Range Status   Specimen Description BLOOD BLOOD RIGHT HAND  Final   Special Requests   Final    BOTTLES DRAWN AEROBIC AND ANAEROBIC Blood Culture adequate volume   Culture   Final    NO GROWTH 2 DAYS Performed at [facility]  [facility], [address] [place], [place] [address]    Report Status PENDING  Incomplete    Radiology Studies:  US THORACENTESIS ASP PLEURAL SPACE W/IMG GUIDE  Result Date: 09/13/2022 INDICATION: Patient with history of non-small cell lung cancer with recurrent right pleural effusion. Request received for therapeutic thoracentesis. EXAM: ULTRASOUND GUIDED THERAPEUTIC RIGHT THORACENTESIS MEDICATIONS: 10 mL 1 % lidocaine COMPLICATIONS: None immediate. PROCEDURE: An ultrasound guided thoracentesis was thoroughly discussed with the patient and questions answered. The benefits, risks, alternatives and complications were also discussed. The patient understands and wishes to proceed with the procedure. Written consent was obtained. Ultrasound was performed to localize and mark an adequate pocket of fluid in the right chest. The area was then prepped and draped in the normal sterile fashion. 1% Lidocaine was used for local anesthesia. Under ultrasound guidance a 6 Fr Safe-T-Centesis catheter was introduced. Thoracentesis was performed. The catheter was removed and a dressing applied. FINDINGS: A total of approximately 1.3 L of dark, bloody fluid was removed. IMPRESSION: Successful ultrasound guided right thoracentesis yielding 1.3 L of pleural fluid. Read by: Alex Gardener, AGNP-BC Electronically Signed   By: Acquanetta Belling M.D.   On: 09/13/2022 14:39   DG Chest Port 1 View  Result Date: 09/13/2022 CLINICAL DATA:  Post thora EXAM: PORTABLE CHEST - 1 VIEW COMPARISON:  CT 09/10/2022 FINDINGS: No pneumothorax. Persistent moderate right pleural effusion, with atelectasis/consolidation at the right lung base. Left lung clear. Stable right IJ port catheter to the distal SVC. Stable left subclavian AICD. Heart size and mediastinal contours are within normal limits. Post MVR. Sternotomy wires. IMPRESSION: Persistent moderate right pleural effusion. No pneumothorax post thoracentesis. Electronically Signed   By: Corlis Leak M.D.    On: 09/13/2022 14:11    Scheduled Meds:    atorvastatin  80 mg Oral Daily   Chlorhexidine Gluconate Cloth  6 each Topical Daily   ezetimibe  10 mg Oral Daily   feeding supplement  237 mL Oral BID BM   gabapentin  300 mg Oral BID   metoprolol succinate  25 mg Oral Daily   midodrine  10 mg Oral TID WC   multivitamin with minerals  1 tablet Oral Daily   Warfarin - Pharmacist Dosing Inpatient   Does not apply q1600    Continuous Infusions:    sodium chloride Stopped (09/11/22 0721)   heparin 1,000 Units/hr (09/13/22 1646)     LOS: 3 days     Marcellus Scott, MD,  FACP, FHM, [facility], [facility], [facility]   [facility] [place]     To contact the attending provider between 7A-7P or the covering provider during after hours 7P-7A, please log into the web site [email or web address] and access using universal [place] password for that web site. If you do not have the password, please call the hospital operator.  09/13/2022, 5:08 PM

## 2022-09-13 NOTE — Consult Note (Signed)
ANTICOAGULATION CONSULT NOTE  Pharmacy Consult for Heparin Infusion with Bridge to Warfarin Indication: mMVR, AFib  No Known Allergies  Patient Measurements: Height: 5\' 2"  (157.5 cm) Weight: 92.5 kg (203 lb 14.8 oz) IBW/kg (Calculated) : 50.1 Heparin Dosing Weight: 71.6 kg  Vital Signs: Temp: 98.4 F (36.9 C) (01/15 0400) Temp Source: Oral (01/15 0400) BP: 102/62 (01/15 0400) Pulse Rate: 91 (01/15 0500)  Labs: Recent Labs    09/10/22 1329 09/10/22 1720 09/11/22 0444 09/11/22 1101 09/11/22 1737 09/12/22 0021 09/12/22 0430 09/13/22 0437  HGB 13.7   < > 12.9  --   --   --  11.7* 10.9*  HCT 43.1   < > 41.2  --   --   --  37.9 34.8*  PLT 246   < > 200  --   --   --  175 178  APTT 177*  --   --  36  --   --   --   --   LABPROT 79.8*   < > 16.4*  --   --   --  15.5* 17.7*  INR 10.1*   < > 1.3*  --   --   --  1.2 1.5*  HEPARINUNFRC  --   --   --   --  0.39 0.54  --  0.67  CREATININE 1.81*   < > 2.16*  --   --   --  1.47* 1.14*   < > = values in this interval not displayed.     Estimated Creatinine Clearance: 54.2 mL/min (A) (by C-G formula based on SCr of 1.14 mg/dL (H)).   Medical History: Past Medical History:  Diagnosis Date   AICD (automatic cardioverter/defibrillator) present    Anterior myocardial infarction [facility])    a. 10/2014 - occluded LAD, complicated by cardiogenic shock-->Med Rx as interventional team was unable to open LAD.   Arthritis    arthirits of knee on right   Cancer of upper lobe of right lung (HCC) 09/2017   radiation therapy right side   Cervical cancer (HCC)    in remission for ~20 yrs, s/p hysterectomy   Chronic combined systolic and diastolic CHF (congestive heart failure) (HCC)    a. 10/2015 Echo: EF 20-25%, sev diast dysfxn, mildly reduced RV fxn. Nl MV prosthesis; b. 12/2017 Echo: EF 20-25%, diff HK. Sev ant/antsept HK, apical AK. Mild MS (mean graad 01/2018). Mod TR. PASP .   Coronary artery disease    a. 10/2014 Ant STEMI/Cath: LAD  100p ->Med managed as lesion could not be crossed-->complicated by CGS and post-MI pericarditis (UVA).   Essential hypertension    Ischemic cardiomyopathy    a. 03/2015 s/p MDT single lead AICD;  b. 10/2015 Echo: EF 20-25%;  c. 12/2017 Echo: EF 20-25%, diff HK.   Mass of upper lobe of right lung    a. noted on CXR & CT 08/2017 w/ abnl PET CT.   Mitral valve disease    a. 2006 s/p MVR w/ 80mm SJM bileaflet mechanical valve-->chronic coumadin/ASA; b. 12/2017 Echo: Mild MS. Mean gradient 01/2018.   Personal history of radiation therapy    f/u lung ca    Medications:  Scheduled:   atorvastatin  80 mg Oral Daily   Chlorhexidine Gluconate Cloth  6 each Topical Daily   ezetimibe  10 mg Oral Daily   feeding supplement  237 mL Oral BID BM   gabapentin  300 mg Oral BID   metoprolol succinate  25 mg Oral Daily  midodrine  10 mg Oral TID WC   multivitamin with minerals  1 tablet Oral Daily   Infusions:   sodium chloride Stopped (09/11/22 0721)   heparin 1,000 Units/hr (09/13/22 0500)   PRN: acetaminophen **OR** acetaminophen, ondansetron **OR** ondansetron (ZOFRAN) IV, oxyCODONE, senna-docusate, sodium chloride flush  PTA Warfarin: 35 mg/wk 5 mg daily   Assessment: Vanessa Carlson is a 62 y.o. female presenting with hemoptysis with INR >10. 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 Patient was on [facility] PTA per chart review. Last dose of warfarin was 1/11 @ 2200. Patient was supratherapeutic on home regimen. Warfarin was reversed with Kcentra and vitamin K 10 mg on 1/12, and patient was admitted to ICU. Pharmacy has been consulted to initiate and manage heparin infusion.   Baseline Labs: aPTT 36, PT 16.4, INR 1.3, Hgb 12.9, Hct 41.2, Plt 200   Goal of Therapy:  INR 2.5-3.5 Heparin level 0.3-0.7  units/ml Monitor platelets by anticoagulation protocol: Yes   Monitoring Heparin:  Date Time HL Rate/Comment  1/13 1737 0.39 Therapeutic x1 1/14 0021 0.54 Therapeutic x 2 1/15 0437 0.67 Therapeutic x 3  Warfarin: Date    INR      Warfarin Dose    1/13 1.3 5 mg 1/11 @ 2200 then Kcentra + vitK 10 mg 1/12 @ 2000 1/14 1.2 5 mg 1/13  Plan:  Heparin Continue heparin infusion at 1000 units/hr Recheck HL daily w/ AM labs Continue to monitor H&H and platelets daily while on heparin infusion  Warfarin Give warfarin 7.5 mg x1 dose today  Anticipate delay in INR increase over next few days given reversal with Kcentra and vitamin K Continue on parenteral bridge therapy until INR therapeutic  Check INR daily until stable Check CBC at least weekly while on warfarin   Otelia Sergeant, PharmD, [facility] 09/13/2022 5:32 AM

## 2022-09-13 NOTE — Progress Notes (Signed)
DISCONTINUE ON PATHWAY REGIMEN - Non-Small Cell Lung     A cycle is every 21 days:     Pembrolizumab   **Always confirm dose/schedule in your pharmacy ordering system**  REASON: Disease Progression PRIOR TREATMENT: [id number]: Pembrolizumab 200 mg q21 Days Until Disease Progression, Unacceptable Toxicity, or up to 24 Months TREATMENT RESPONSE: Progressive Disease (PD)  START ON PATHWAY REGIMEN - Non-Small Cell Lung     A cycle is every 21 days:     Pembrolizumab      Pemetrexed      Carboplatin   **Always confirm dose/schedule in your pharmacy ordering system**  Patient Characteristics: Stage IV Metastatic, Nonsquamous, Molecular Analysis Completed, Molecular Alteration Present and Targeted Therapy Exhausted OR EGFR Exon 20+ or KRAS G12C+ or HER2+ Present and No Prior Chemo/Immunotherapy OR No Alteration Present, Initial  Chemotherapy/Immunotherapy, PS = 0, 1, No Alteration Present, No Alteration Present, Candidate for Immunotherapy, PD-L1 Expression Positive  ? 50% (TPS) and Immunotherapy Candidate Therapeutic Status: Stage IV Metastatic Histology: Nonsquamous Cell Broad Molecular Profiling Status: Animal nutritionist Analysis Results: No Alteration Present ECOG Performance Status: 1 Chemotherapy/Immunotherapy Line of Therapy: Initial Chemotherapy/Immunotherapy EGFR Exons 18-21 Mutation Testing Status: Completed and Negative ALK Fusion/Rearrangement Testing Status: Completed and Negative BRAF V600 Mutation Testing Status: Completed and Negative KRAS G12C Mutation Testing Status: Completed and Negative MET Exon 14 Mutation Testing Status: Completed and Negative RET Fusion/Rearrangement Testing Status: Completed and Negative HER2 Mutation Testing Status: Completed and Negative NTRK Fusion/Rearrangement Testing Status: Completed and Negative ROS1 Fusion/Rearrangement Testing Status: Completed and Negative Immunotherapy Candidate Status: Candidate for  Immunotherapy PD-L1 Expression Status: PD-L1 Positive ? 50% (TPS) Intent of Therapy: Non-Curative / Palliative Intent, Discussed with Patient

## 2022-09-13 NOTE — Procedures (Signed)
PROCEDURE SUMMARY:  Successful US guided therapeutic right thoracentesis. Yielded 1.3 L of dark, bloody fluid. Pt tolerated procedure well. No immediate complications.  Specimen not sent for labs. CXR ordered.  EBL < 1 mL  Shon Hough, AGNP 09/13/2022 2:24 PM

## 2022-09-13 NOTE — Consult Note (Signed)
PHARMACY CONSULT NOTE - ELECTROLYTES  Pharmacy Consult for Electrolyte Monitoring and Replacement   Recent Labs: Potassium (mmol/L)  Date Value  09/13/2022 3.9   Magnesium (mg/dL)  Date Value  75/79/7282 2.5 (H)   Calcium (mg/dL)  Date Value  01/28/5614 8.5 (L)   Albumin (g/dL)  Date Value  37/94/3276 3.3 (L)  08/18/2018 4.2   Phosphorus (mg/dL)  Date Value  14/70/9295 1.7 (L)   Sodium (mmol/L)  Date Value  09/13/2022 135  12/19/2019 145 (H)   Corrected Ca: 8.6 mg/dL  Assessment  Vanessa Carlson is a 62 y.o. female presenting with hemoptysis with INR >10. 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 Pharmacy has been consulted to monitor and replace phosphorous.  Diet: Regular  Goal of Therapy: Electrolytes WNL  Plan:  20 mmol IV sodium phosphate x 1 Recheck electrolytes in am  Thank you for allowing pharmacy to be a part of this patient's care.  Burnis Medin, PharmD, BCPS 09/13/2022 7:05 AM

## 2022-09-13 NOTE — Progress Notes (Signed)
Treatment plan updated to carbo/alimta/keytruda.

## 2022-09-13 NOTE — Progress Notes (Signed)
NAME:  Vanessa Carlson, MRN:  [id number], DOB:  1961-04-19, LOS: 3 ADMISSION DATE:  09/10/2022, CONSULTATION DATE:  09/10/22 REFERRING MD:  Cox Amy, CHIEF COMPLAINT:  Hemoptysis    HPI  62 y.o. 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   ED Course: Initial vital signs showed HR of 109 beats/minute, BP 118/72 mm Hg, the RR 17 breaths/minute, and the oxygen saturation 96 % on and a temperature of 98.75F (37.0C).    Pertinent Labs/Diagnostics Findings: Chemistry:Na+/ K+: 137/3.5 Glucose:119 BUN/Cr.:20/1.97 CBC: Unremarkable Other Lab findings: PT/INR: 84.1/>10.0  P Imaging:CT Chest Abd/pelvis>see below   Significant Hospital Events   1/12: Admit to stepdown with Hemoptysis and supratherapeutic INR, developed hypotension. PCCM consulted for possible pressor requirement 09/11/22- patient s/p TTE today.  Ordered IR thoracentesis due to large atelectasis and sorrounding effusion. Family at bedside 09/12/22- patient is stable today no acute events overnight. For thoracentesis tommorow. 09/13/22- patient with no acute events overnight. Awaiting thoracentesis.   Consults:  PCCM  Procedures:  None  Significant Diagnostic Tests:  1/12: CT Chest/Abd/Pelvis>  IMPRESSION: 1. Enlarging right upper lobe mass consistent with progressive lung cancer. 2. Large multiloculated right pleural effusion, increased in size since prior study. Significant compressive atelectasis within the right middle and right lower lobe, with only a small portion of normally aerated lung within the superior segment  right lower lobe. 3. New and enlarging left lung pulmonary nodules consistent with progressive metastatic disease. 4. Indeterminate 8 mm hypodensity inferior right lobe liver, not well seen on previous exams. Metastatic disease cannot be excluded. 5. Grossly stable borderline enlarged mediastinal adenopathy. 6. Gallbladder sludge. No evidence of cholelithiasis or cholecystitis. 7.  Aortic Atherosclerosis (ICD10-I70.0).  Micro Data:  None  Antimicrobials:  None  OBJECTIVE  Blood pressure 101/67, pulse (!) 101, temperature 97.7 F (36.5 C), temperature source Oral, resp. rate (!) 25, height 5\' 2"  (1.575 m), weight 92.5 kg, SpO2 99 %.        Intake/Output Summary (Last 24 hours) at 09/13/2022 0903 Last data filed at 09/13/2022 0800 Gross per 24 hour  Intake 358.38 ml  Output 801 ml  Net -442.62 ml    Filed Weights   09/11/22 0332  Weight: 92.5 kg   Physical Examination  GENERAL:62  year-old critically ill patient lying in the bed with no acute distress.  EYES: Pupils equal, round, reactive to light and accommodation. No scleral icterus. Extraocular muscles intact.  HEENT: Head atraumatic, normocephalic. Oropharynx and nasopharynx clear.  NECK:  Supple, no jugular venous distention. No thyroid enlargement, no tenderness.  LUNGS: Normal breath sounds bilaterally, no wheezing, rales,rhonchi or crepitation. No use of accessory muscles of respiration.  CARDIOVASCULAR: S1, S2 normal. No murmurs, rubs, or gallops.  ABDOMEN: Soft, nontender, nondistended. Bowel sounds present. No organomegaly or mass.  EXTREMITIES: Upper and lower extremities are atraumatic. No swelling or erythema. Muscle strength is 5/5 bilaterally. Capillary refill> 3 seconds in all extremities. Pulses palpable distally. NEUROLOGIC:The patient is awake, alert and oriented  x 3 with normal speech. Sensation is intact bilaterally. Cranial nerves are intact.  Gait not checked.  PSYCHIATRIC: Appropriate mood and affect.   SKIN: No  obvious rash, lesion, or ulcer.   Labs/imaging that I havepersonally reviewed  (right click and "Reselect all SmartList Selections" daily)     Labs   CBC: Recent Labs  Lab 09/10/22 1329 09/10/22 1720 09/10/22 1959 09/11/22 0444 09/12/22 0430 09/13/22 0437  WBC 10.6* 10.2  --  11.4* 10.6* 9.3  NEUTROABS 7.8*  --   --   --   --   --   HGB 13.7 13.8 13.4 12.9 11.7* 10.9*  HCT 43.1 44.1 43.3 41.2 37.9 34.8*  MCV 90.0 89.8  --  91.8 91.5 91.1  PLT 246 229  --  200 175 178     Basic Metabolic Panel: Recent Labs  Lab 09/10/22 1329 09/10/22 1720 09/10/22 1857 09/11/22 0444 09/11/22 1057 09/12/22 0430 09/13/22 0437 09/13/22 0500  NA 137 137  --  136  --  134* 135  --   K 3.4* 3.5  --  3.3*  --  4.3 3.9  --   CL 97* 98  --  100  --  100 101  --   CO2 27 27  --  26  --  27 29  --   GLUCOSE 132* 119*  --  109*  --  106* 98  --   BUN 20 20  --  23  --  24* 16  --   CREATININE 1.81* 1.97*  --  2.16*  --  1.47* 1.14*  --   CALCIUM 9.1 9.3  --  8.6*  --  8.6* 8.5*  --   MG  --   --  2.5*  --   --   --   --  2.4  PHOS  --   --  1.9*  --  2.2* 2.1* 1.7*  --     GFR: Estimated Creatinine Clearance: 54.2 mL/min (A) (by C-G formula based on SCr of 1.14 mg/dL (H)). Recent Labs  Lab 09/10/22 1720 09/11/22 0436 09/11/22 0444 09/12/22 0430 09/13/22 0437  PROCALCITON  --  0.32  --  0.34 0.27  WBC 10.2  --  11.4* 10.6* 9.3     Liver Function Tests: Recent Labs  Lab 09/10/22 1329 09/10/22 1720 09/13/22 0437  AST 31 33  --   ALT 17 17  --   ALKPHOS 193* 203*  --   BILITOT 1.8* 2.3*  --   PROT 8.1 8.5*  --   ALBUMIN 4.0 4.2 3.3*    No results for input(s): "LIPASE", "AMYLASE" in the last 168 hours. No results for input(s): "AMMONIA" in the last 168 hours.  ABG No results found for: "PHART", "PCO2ART", "PO2ART", "HCO3", "TCO2", "ACIDBASEDEF", "O2SAT"   Coagulation Profile: Recent Labs  Lab 09/10/22 1720 09/10/22 2152 09/11/22 0444 09/12/22 0430  09/13/22 0437  INR >10.0* 1.4* 1.3* 1.2 1.5*     Cardiac Enzymes: No results for input(s): "CKTOTAL", "CKMB", "CKMBINDEX", "TROPONINI" in the last 168 hours.  HbA1C: Hgb A1c MFr Bld  Date/Time Value Ref Range Status  08/20/2022 01:50 PM 6.2 (H) 4.8 - 5.6 % Final    Comment:    (NOTE)         Prediabetes: 5.7 - 6.4         Diabetes: >6.4         Glycemic control for adults with diabetes: <7.0   12/14/2019 09:09 AM 5.9 (H) 4.8 - 5.6 % Final    Comment:             Prediabetes: 5.7 - 6.4  Diabetes: >6.4          Glycemic control for adults with diabetes: <7.0     CBG: Recent Labs  Lab 09/11/22 0307  GLUCAP 109*    Review of Systems:    10 point ROS done and is negative except as per HPI and dyspnea   Past Medical History  She,  has a past medical history of AICD (automatic cardioverter/defibrillator) present, Anterior myocardial infarction [facility]), Arthritis, Cancer of upper lobe of right lung (HCC) (09/2017), Cervical cancer (HCC), Chronic combined systolic and diastolic CHF (congestive heart failure) (HCC), Coronary artery disease, Essential hypertension, Ischemic cardiomyopathy, Mass of upper lobe of right lung, Mitral valve disease, and Personal history of radiation therapy.   Surgical History    Past Surgical History:  Procedure Laterality Date   APPENDECTOMY  1998   BREAST EXCISIONAL BIOPSY Left 80s   benign   CARDIAC CATHETERIZATION     CARDIAC DEFIBRILLATOR PLACEMENT     COLONOSCOPY WITH PROPOFOL N/A 01/11/2020   Procedure: COLONOSCOPY WITH PROPOFOL;  Surgeon: Wyline Mood, MD;  Location: [facility] ENDOSCOPY;  Service: Gastroenterology;  Laterality: N/A;   IR IMAGING GUIDED PORT INSERTION  06/29/2022   PORTA CATH INSERTION     RADICAL HYSTERECTOMY  1998   REPLACEMENT TOTAL KNEE Right 12/10/2021   SHOULDER SURGERY Bilateral    rotator cuff tears   VALVE REPLACEMENT     Mitral valve; 27 mm [employer] bileaflet valve     Social History   reports that  she quit smoking about 26 years ago. Her smoking use included cigarettes. She has a 4.00 pack-year smoking history. She has been exposed to tobacco smoke. She has never used smokeless tobacco. She reports that she does not drink alcohol and does not use drugs.   Family History   Her family history includes Breast cancer in her maternal aunt and maternal aunt; Colon cancer in her maternal uncle; Diabetes in her maternal grandmother and mother; Emphysema in her father; Heart attack in her mother; Hypertension in her mother; Kidney disease in her maternal grandmother.   Allergies No Known Allergies   Home Medications  Prior to Admission medications   Medication Sig Start Date End Date Taking? Authorizing Provider  aspirin 81 MG chewable tablet Chew 81 mg by mouth daily.   Yes [provider]  atorvastatin (LIPITOR) 80 MG tablet TAKE 1 TABLET BY MOUTH ONCE DAILY. OFFICE VISIT AND LABS NEEDED FOR FURTHER REFILLS. 08/31/22  Yes Bacigalupo, Marzella Schlein, MD  ENTRESTO 24-26 MG TAKE 1 TABLET BY MOUTH TWICE DAILY *SCHEDULE OFFICE VISIT FOR FURTHER REFILLS* 08/02/22  Yes End, Cristal Deer, MD  ezetimibe (ZETIA) 10 MG tablet Take 1 tablet (10 mg total) by mouth daily. 07/12/22  Yes End, Cristal Deer, MD  furosemide (LASIX) 40 MG tablet Take 2 tablets (80 mg total) by mouth 2 (two) times daily. 01/21/22  Yes End, Cristal Deer, MD  gabapentin (NEURONTIN) 300 MG capsule Take 1 capsule (300 mg total) by mouth 2 (two) times daily. 10/26/21  Yes Bacigalupo, Marzella Schlein, MD  lidocaine-prilocaine (EMLA) cream Apply to affected area once 06/25/22  Yes Creig Hines, MD  metoprolol succinate (TOPROL-XL) 25 MG 24 hr tablet TAKE 1 TABLET BY MOUTH TWICE DAILY IN THE MORNING AND AT BEDTIME 06/21/22  Yes End, Cristal Deer, MD  ondansetron (ZOFRAN) 8 MG tablet Take 1 tablet (8 mg total) by mouth every 8 (eight) hours as needed for nausea or vomiting. 06/25/22  Yes Creig Hines, MD  oxyCODONE (OXY IR/ROXICODONE) 5  MG immediate  release tablet Take 1 tablet (5 mg total) by mouth every 4 (four) hours as needed for severe pain. 09/10/22  Yes Creig Hines, MD  prochlorperazine (COMPAZINE) 10 MG tablet Take 1 tablet (10 mg total) by mouth every 6 (six) hours as needed for nausea or vomiting. 06/25/22  Yes Creig Hines, MD  spironolactone (ALDACTONE) 25 MG tablet Take 1 tablet (25 mg total) by mouth daily. 04/27/22  Yes Caro Laroche, DO  warfarin (COUMADIN) 5 MG tablet 5 mg daily except 7.5 mg on Mon and Fri Patient taking differently: Take 5 mg by mouth every evening. 05/14/22  Yes Drubel, Lillia Abed, PA-C  metolazone (ZAROXOLYN) 2.5 MG tablet Take 1 tablet (2.5 mg) by mouth once a week. Take 30 minutes prior to your furosemide dose. Patient not taking: Reported on 09/10/2022 05/20/22   End, Cristal Deer, MD  oxybutynin (DITROPAN-XL) 10 MG 24 hr tablet Take 1 tablet (10 mg total) by mouth daily. Patient not taking: Reported on 09/10/2022 06/21/22   Alfredo Martinez, MD  potassium chloride SA (KLOR-CON M) 20 MEQ tablet Take 2 tablets (40 mEq total) by mouth daily. Patient not taking: Reported on 09/10/2022 05/20/22   End, Cristal Deer, MD  Scheduled Meds:  atorvastatin  80 mg Oral Daily   Chlorhexidine Gluconate Cloth  6 each Topical Daily   ezetimibe  10 mg Oral Daily   feeding supplement  237 mL Oral BID BM   gabapentin  300 mg Oral BID   metoprolol succinate  25 mg Oral Daily   midodrine  10 mg Oral TID WC   multivitamin with minerals  1 tablet Oral Daily   Continuous Infusions:  sodium chloride Stopped (09/11/22 0721)   heparin 1,000 Units/hr (09/13/22 0800)   sodium phosphate 20 mmol in dextrose 5 % 250 mL infusion 20 mmol (09/13/22 0830)   PRN Meds:.acetaminophen **OR** acetaminophen, ondansetron **OR** ondansetron (ZOFRAN) IV, oxyCODONE, senna-docusate, sodium chloride flush   Active Hospital Problem list     Assessment & Plan:  #Acute on Chronic Respiratory Failure #Recurrent Pleural Effusion Due to  malignancy   Background history of lung cancer now with stage IV adenocarcinoma with malignant right pleural effusion treated with palliative Keytruda and thoracentesis.  Now presenting with hemoptysis and shortness of breath. Repeat CT doesn't show any dense consolidations to suggest superimposed bacterial pneumonia.  However, there progressive lung cancer with large multi chelated right pleural effusion which appears to have increase in size with significant compressive atelectasis in the right middle and lower lobes and additional new and enlarging left lung pulmonary nodules consistent with progressive metastatic disease.  -Supplemental O2 as needed to maintain O2 saturations 88 to 92% -Follow intermittent ABG and chest x-ray as needed -As needed bronchodilators -IR consult for thoracentesis, Last thoracentesis on 12/28 yielded 1.4 L -Palliative care consult  # Hemoptysis in the setting of supra therapeutic INR INR on admit>10 S/p Kcentral and Vitamin K Current INR -Hold anticoagulation for now pending possible thoracentesis as above -Follow PT/INR  Hypotension concern for Sepsis -F/u cultures, trend lactic/ PCT -Monitor WBC/ fever curve -Hold empiric IV antibiotics for now  -Pressors for MAP goal >65, has not required pressors -Strict I/O's   # AKI likely ATN in the setting of above # Hypokalemia -Monitor I&O's / urinary output -Follow BMP -Ensure adequate renal perfusion -Avoid nephrotoxic agents as able -Replace electrolytes as indicated   #Chronic HFrEF (LVEF 25-30% with global hypokinesis ) #Hypertension #Atrial Fibrillation Hx: CAD s/p LHC (2016), STEMI,  Ischemic Cardiomyopathy, HLD  -Continuous cardiac monitoring -Maintain MAP greater than 65 -IV Lasix as blood pressure and renal function permits; currently on Lasix 40 mg BID -Hold metoprolol, Entresto, and spironolactone for now in the setting of hypotension and AKI -Hold Coumadin due to supratherapeutic  INR -Continue aspirin 81 mg and atorvastatin 80 mg/day -Consider cardiology consult -Repeat 2D Echocardiogram     Best practice:  Diet:  Oral Pain/Anxiety/Delirium protocol (if indicated): No VAP protocol (if indicated): Not indicated DVT prophylaxis: Contraindicated GI prophylaxis: PPI Glucose control:  SSI No Central venous access:  N/A Arterial line:  N/A Foley:  N/A Mobility:  bed rest  PT consulted: N/A Last date of multidisciplinary goals of care discussion [1/12] Code Status:  full code Disposition: Stepdown   = Goals of Care = Code Status Order: FULL  Primary Emergency Contact: Laszlo,Tosin, Home Phone: [phone] Wishes to pursue full aggressive treatment and intervention options, including CPR and intubation, but goals of care will be addressed on going with family if that should become necessary.   Critical care provider statement:   Total critical care time: 33 minutes   Performed by: Karna Christmas MD   Critical care time was exclusive of separately billable procedures and treating other patients.   Critical care was necessary to treat or prevent imminent or life-threatening deterioration.   Critical care was time spent personally by me on the following activities: development of treatment plan with patient and/or surrogate as well as nursing, discussions with consultants, evaluation of patient's response to treatment, examination of patient, obtaining history from patient or surrogate, ordering and performing treatments and interventions, ordering and review of laboratory studies, ordering and review of radiographic studies, pulse oximetry and re-evaluation of patient's condition.    Vida Rigger, M.D.  Pulmonary & Critical Care Medicine

## 2022-09-13 NOTE — Consult Note (Signed)
ANTICOAGULATION CONSULT NOTE  Pharmacy Consult for Heparin Infusion with Bridge to Warfarin Indication: mMVR, AFib  No Known Allergies  Patient Measurements: Height: 5\' 2"  (157.5 cm) Weight: 92.5 kg (203 lb 14.8 oz) IBW/kg (Calculated) : 50.1 Heparin Dosing Weight: 71.6 kg  Vital Signs: Temp: 98.4 F (36.9 C) (01/15 0400) Temp Source: Oral (01/15 0400) BP: 104/66 (01/15 0600) Pulse Rate: 89 (01/15 0700)  Labs: Recent Labs    09/10/22 1329 09/10/22 1720 09/11/22 0444 09/11/22 1101 09/11/22 1737 09/12/22 0021 09/12/22 0430 09/13/22 0437  HGB 13.7   < > 12.9  --   --   --  11.7* 10.9*  HCT 43.1   < > 41.2  --   --   --  37.9 34.8*  PLT 246   < > 200  --   --   --  175 178  APTT 177*  --   --  36  --   --   --   --   LABPROT 79.8*   < > 16.4*  --   --   --  15.5* 17.7*  INR 10.1*   < > 1.3*  --   --   --  1.2 1.5*  HEPARINUNFRC  --   --   --   --  0.39 0.54  --  0.67  CREATININE 1.81*   < > 2.16*  --   --   --  1.47* 1.14*   < > = values in this interval not displayed.     Estimated Creatinine Clearance: 54.2 mL/min (A) (by C-G formula based on SCr of 1.14 mg/dL (H)).   Medical History: Past Medical History:  Diagnosis Date   AICD (automatic cardioverter/defibrillator) present    Anterior myocardial infarction [facility])    a. 10/2014 - occluded LAD, complicated by cardiogenic shock-->Med Rx as interventional team was unable to open LAD.   Arthritis    arthirits of knee on right   Cancer of upper lobe of right lung (HCC) 09/2017   radiation therapy right side   Cervical cancer (HCC)    in remission for ~20 yrs, s/p hysterectomy   Chronic combined systolic and diastolic CHF (congestive heart failure) (HCC)    a. 10/2015 Echo: EF 20-25%, sev diast dysfxn, mildly reduced RV fxn. Nl MV prosthesis; b. 12/2017 Echo: EF 20-25%, diff HK. Sev ant/antsept HK, apical AK. Mild MS (mean graad 01/2018). Mod TR. PASP .   Coronary artery disease    a. 10/2014 Ant STEMI/Cath: LAD  100p ->Med managed as lesion could not be crossed-->complicated by CGS and post-MI pericarditis (UVA).   Essential hypertension    Ischemic cardiomyopathy    a. 03/2015 s/p MDT single lead AICD;  b. 10/2015 Echo: EF 20-25%;  c. 12/2017 Echo: EF 20-25%, diff HK.   Mass of upper lobe of right lung    a. noted on CXR & CT 08/2017 w/ abnl PET CT.   Mitral valve disease    a. 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 12/2017 Echo: Mild MS. Mean gradient 01/2018.   Personal history of radiation therapy    f/u lung ca    Medications:  Scheduled:   atorvastatin  80 mg Oral Daily   Chlorhexidine Gluconate Cloth  6 each Topical Daily   ezetimibe  10 mg Oral Daily   feeding supplement  237 mL Oral BID BM   gabapentin  300 mg Oral BID   metoprolol succinate  25 mg Oral Daily  midodrine  10 mg Oral TID WC   multivitamin with minerals  1 tablet Oral Daily   Infusions:   sodium chloride Stopped (09/11/22 0721)   heparin 1,000 Units/hr (09/13/22 0700)   sodium phosphate 20 mmol in dextrose 5 % 250 mL infusion     PRN: acetaminophen **OR** acetaminophen, ondansetron **OR** ondansetron (ZOFRAN) IV, oxyCODONE, senna-docusate, sodium chloride flush  PTA Warfarin: 35 mg/wk 5 mg daily   Assessment: Vanessa Carlson is a 62 y.o. female presenting with hemoptysis with INR >10. 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 Patient was on [facility] PTA per chart review. Last dose of warfarin was 1/11 @ 2200. Patient was supratherapeutic on home regimen. Warfarin was reversed with Kcentra and vitamin K 10 mg on 1/12, and patient was admitted to ICU. Pharmacy has been consulted to initiate and manage heparin infusion.   Baseline Labs: aPTT 36, PT 16.4, INR 1.3, Hgb 12.9, Hct 41.2, Plt 200   Goal  of Therapy:  INR 2.5-3.5 Heparin level 0.3-0.7 units/ml Monitor platelets by anticoagulation protocol: Yes   Monitoring Heparin:  Date Time HL Rate/Comment  1/13 1737 0.39 Therapeutic x1 1/14 0021 0.54 Therapeutic x 2 1/15 0437 0.67  Therapeutic x 3  Warfarin: Date    INR      Warfarin Dose    1/13 1.3 5 mg 1/11 @ 2200 then Kcentra + vitK 10 mg 1/12 @ 2000 1/14 1.2 7.5 mg  (held) 1/15 1.5  Plan:  Heparin: heparin was held this am for US guided therapeutic right thoracentesis  Continue heparin infusion at 1000 units/hr (start 4 hours post-procedure based on CHG guidelines) Recheck heparin level once daily w/ AM labs Continue to monitor H&H and platelets daily while on heparin infusion  Warfarin Give warfarin 7.5 mg x1 dose today  Anticipate delay in INR increase over next few days given reversal with Kcentra and vitamin K Continue on parenteral bridge therapy until INR therapeutic  Check INR daily until stable Check CBC at least weekly while on warfarin   Burnis Medin, PharmD, BCPS 09/13/2022 8:10 AM

## 2022-09-13 NOTE — Progress Notes (Signed)
EPIC Encounter for ICM Monitoring  Patient Name: Vanessa Carlson is a 62 y.o. female Date: 09/13/2022 Primary Care Physican: Erasmo Downer, MD rimary Cardiologist: Debroah Baller, NP HF Electrophysiologist: Graciela Husbands 02/04/2022 Office Weight: 228 lbs 02/24/2022 Weight: 226 lbs         Transmission reviewed. Pt hospitalized starting 1/12.  Pt has lung CA.     Optivol thoracic impedance suggesting normal fluid levels.    Prescribed:  Furosemide 40 mg 2 tablets (80 mg total) by mouth two times daily Spironolactone 25 mg take 1 tablet daily   Labs: 09/13/2022 Creatinine 1.14, BUN 16, Potassium 3.9, Sodium 135, GFR 54  09/12/2022 Creatinine 1.47, BUN 24, Potassium 4.3, Sodium 134, GFR 40  09/11/2022 Creatinine 2.16, BUN 23, Potassium 3.3, Sodium 136, GFR 25  09/10/2022 Creatinine 1.97, BUN 20, Potassium 3.5, Sodium 137, GFR 28 08/20/2022 Creatinine 1.22, BUN 13, Potassium 3.0, Sodium 137, GFR 50  07/30/2022 Creatinine 0.94, BUN 11, Potassium 3.4, Sodium 136, GFR >60  07/02/2022 Creatinine 1.06, BUN 16, Potassium 3.5, Sodium 137, GFR 59 05/20/2022 Creatinine 1.15, BUN 13, Potassium 3.9, Sodium 140, GFR 54 10/02/2021 Creatinine 1.12, BUN 14, Potassium 4.2, Sodium 137, GFR 56 A complete set of results can be found in Results Review.   Recommendations:  No changes.   Follow-up plan: ICM clinic phone appointment on 09/27/2022 to check stability of fluid levels after hospitalization.   91 day device clinic remote transmission 10/05/2022.       EP/Cardiology Office Visits:  Recall 03/23/2023 with Dr. Graciela Husbands.  10/10/2022 with Dr End.   Copy of ICM check sent to Dr. Graciela Husbands.      3 month ICM trend: 09/09/2022.    12-14 Month ICM trend:     Karie Soda, RN 09/13/2022 9:51 AM

## 2022-09-14 DIAGNOSIS — I480 Paroxysmal atrial fibrillation: Secondary | ICD-10-CM | POA: Diagnosis not present

## 2022-09-14 DIAGNOSIS — R791 Abnormal coagulation profile: Secondary | ICD-10-CM | POA: Diagnosis not present

## 2022-09-14 DIAGNOSIS — N179 Acute kidney failure, unspecified: Secondary | ICD-10-CM | POA: Diagnosis not present

## 2022-09-14 DIAGNOSIS — R042 Hemoptysis: Secondary | ICD-10-CM | POA: Diagnosis not present

## 2022-09-14 DIAGNOSIS — J910 Malignant pleural effusion: Secondary | ICD-10-CM | POA: Diagnosis not present

## 2022-09-14 DIAGNOSIS — I5022 Chronic systolic (congestive) heart failure: Secondary | ICD-10-CM | POA: Diagnosis not present

## 2022-09-14 LAB — HEPARIN LEVEL (UNFRACTIONATED): Heparin Unfractionated: 0.36 IU/mL (ref 0.30–0.70)

## 2022-09-14 LAB — RENAL FUNCTION PANEL
Albumin: 3.4 g/dL — ABNORMAL LOW (ref 3.5–5.0)
Anion gap: 8 (ref 5–15)
BUN: 15 mg/dL (ref 8–23)
CO2: 27 mmol/L (ref 22–32)
Calcium: 8.7 mg/dL — ABNORMAL LOW (ref 8.9–10.3)
Chloride: 100 mmol/L (ref 98–111)
Creatinine, Ser: 1.24 mg/dL — ABNORMAL HIGH (ref 0.44–1.00)
GFR, Estimated: 49 mL/min — ABNORMAL LOW (ref >60)
Glucose, Bld: 96 mg/dL (ref 70–99)
Phosphorus: 1.5 mg/dL — ABNORMAL LOW (ref 2.5–4.6)
Potassium: 4.2 mmol/L (ref 3.5–5.1)
Sodium: 135 mmol/L (ref 135–145)

## 2022-09-14 LAB — CBC
HCT: 36.9 % (ref 36.0–46.0)
Hemoglobin: 11.8 g/dL — ABNORMAL LOW (ref 12.0–15.0)
MCH: 28.6 pg (ref 26.0–34.0)
MCHC: 32 g/dL (ref 30.0–36.0)
MCV: 89.3 fL (ref 80.0–100.0)
Platelets: 152 10*3/uL (ref 150–400)
RBC: 4.13 MIL/uL (ref 3.87–5.11)
RDW: 17.2 % — ABNORMAL HIGH (ref 11.5–15.5)
WBC: 9.4 10*3/uL (ref 4.0–10.5)
nRBC: 0 % (ref 0.0–0.2)

## 2022-09-14 LAB — PROTIME-INR
INR: 1.6 — ABNORMAL HIGH (ref 0.8–1.2)
Prothrombin Time: 19.1 seconds — ABNORMAL HIGH (ref 11.4–15.2)

## 2022-09-14 LAB — MAGNESIUM: Magnesium: 2.3 mg/dL (ref 1.7–2.4)

## 2022-09-14 MED ORDER — WARFARIN SODIUM 5 MG PO TABS
5.0000 mg | ORAL_TABLET | Freq: Once | ORAL | Status: AC
  Administered 2022-09-14: 5 mg via ORAL
  Filled 2022-09-14: qty 1

## 2022-09-14 MED ORDER — SODIUM PHOSPHATES 45 MMOLE/15ML IV SOLN
30.0000 mmol | Freq: Once | INTRAVENOUS | Status: AC
  Administered 2022-09-14: 30 mmol via INTRAVENOUS
  Filled 2022-09-14: qty 10

## 2022-09-14 MED ORDER — PROSOURCE PLUS PO LIQD
30.0000 mL | Freq: Two times a day (BID) | ORAL | Status: DC
  Administered 2022-09-14 – 2022-09-16 (×4): 30 mL via ORAL

## 2022-09-14 MED ORDER — ORAL CARE MOUTH RINSE: 15.0000 mL | OROMUCOSAL | Status: DC | PRN

## 2022-09-14 NOTE — Plan of Care (Signed)

## 2022-09-14 NOTE — Progress Notes (Signed)
Mobility Specialist - Progress Note     09/14/22 0826  Mobility  Activity Ambulated with assistance to bathroom;Stood at bedside  Level of Assistance Standby assist, set-up cues, supervision of patient - no hands on  Assistive Device Front wheel walker  Distance Ambulated (ft) 5 ft  Activity Response Tolerated well  Mobility Referral Yes  $Mobility charge 1 Mobility   Pt resting in bed on RA upon entry. Pt STS and ambulates to bathroom SBA to RW. Pt returned to bed and left with needs in reach. Pt endorses back pain, NT/RN notified.   Johnathan Hausen Mobility Specialist 09/14/22, 8:29 AM

## 2022-09-14 NOTE — Progress Notes (Signed)
NAME:  Vanessa Carlson, MRN:  [id number], DOB:  [birth year], LOS: 4 ADMISSION DATE:  09/10/2022, CONSULTATION DATE:  09/10/22 REFERRING MD:  Cox Amy, CHIEF COMPLAINT:  Hemoptysis    HPI  62 y.o. 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   ED Course: Initial vital signs showed HR of 109 beats/minute, BP 118/72 mm Hg, the RR 17 breaths/minute, and the oxygen saturation 96 % on and a temperature of 98.43F (37.0C).    Pertinent Labs/Diagnostics Findings: Chemistry:Na+/ K+: 137/3.5 Glucose:119 BUN/Cr.:20/1.97 CBC: Unremarkable Other Lab findings: PT/INR: 84.1/>10.0  P Imaging:CT Chest Abd/pelvis>see below   Significant Hospital Events   1/12: Admit to stepdown with Hemoptysis and supratherapeutic INR, developed hypotension. PCCM consulted for possible pressor requirement 09/11/22- patient s/p TTE today.  Ordered IR thoracentesis due to large atelectasis and sorrounding effusion. Family at bedside 09/12/22- patient is stable today no acute events overnight. For thoracentesis tommorow. 09/13/22- patient with no acute events overnight. Awaiting thoracentesis. 09/14/22- patient is doing well clinically, PCCM signing off and available if needed   Consults:  PCCM  Procedures:  None  Significant Diagnostic Tests:  1/12: CT Chest/Abd/Pelvis>  IMPRESSION: 1. Enlarging right upper lobe mass consistent with progressive lung cancer. 2. Large multiloculated right pleural effusion, increased in size since prior study. Significant compressive atelectasis within the right middle and right lower  lobe, with only a small portion of normally aerated lung within the superior segment right lower lobe. 3. New and enlarging left lung pulmonary nodules consistent with progressive metastatic disease. 4. Indeterminate 8 mm hypodensity inferior right lobe liver, not well seen on previous exams. Metastatic disease cannot be excluded. 5. Grossly stable borderline enlarged mediastinal adenopathy. 6. Gallbladder sludge. No evidence of cholelithiasis or cholecystitis. 7.  Aortic Atherosclerosis (ICD10-I70.0).  Micro Data:  None  Antimicrobials:  None  OBJECTIVE  Blood pressure (!) 106/58, pulse (!) 106, temperature 98.4 F (36.9 C), resp. rate 20, height 5\' 2"  (1.575 m), weight 92.5 kg, SpO2 94 %.        Intake/Output Summary (Last 24 hours) at 09/14/2022 0857 Last data filed at 09/14/2022 0532 Gross per 24 hour  Intake 856.51 ml  Output 700 ml  Net 156.51 ml    Filed Weights   09/11/22 0332  Weight: 92.5 kg   Physical Examination  GENERAL:62  year-old critically ill patient lying in the bed with no acute distress.  EYES: Pupils equal, round, reactive to light and accommodation. No scleral icterus. Extraocular muscles intact.  HEENT: Head atraumatic, normocephalic. Oropharynx and nasopharynx clear.  NECK:  Supple, no jugular venous distention. No thyroid enlargement, no tenderness.  LUNGS: Normal breath sounds bilaterally, no wheezing, rales,rhonchi or crepitation. No use of accessory muscles of respiration.  CARDIOVASCULAR: S1, S2 normal. No murmurs, rubs, or gallops.  ABDOMEN: Soft, nontender, nondistended. Bowel sounds present. No organomegaly or mass.  EXTREMITIES: Upper and lower extremities are atraumatic. No swelling or erythema. Muscle strength is 5/5 bilaterally. Capillary refill> 3 seconds in all extremities. Pulses palpable distally. NEUROLOGIC:The patient is awake, alert and oriented  x 3 with normal speech. Sensation is intact bilaterally. Cranial nerves are intact.   Gait not  checked.  PSYCHIATRIC: Appropriate mood and affect.  SKIN: No obvious rash, lesion, or ulcer.   Labs/imaging that I havepersonally reviewed  (right click and "Reselect all SmartList Selections" daily)     Labs   CBC: Recent Labs  Lab 09/10/22 1329 09/10/22 1720 09/10/22 1959 09/11/22 0444 09/12/22 0430 09/13/22 0437 09/14/22 0420  WBC 10.6* 10.2  --  11.4* 10.6* 9.3 9.4  NEUTROABS 7.8*  --   --   --   --   --   --   HGB 13.7 13.8 13.4 12.9 11.7* 10.9* 11.8*  HCT 43.1 44.1 43.3 41.2 37.9 34.8* 36.9  MCV 90.0 89.8  --  91.8 91.5 91.1 89.3  PLT 246 229  --  200 175 178 152     Basic Metabolic Panel: Recent Labs  Lab 09/10/22 1720 09/10/22 1857 09/11/22 0444 09/11/22 1057 09/12/22 0430 09/13/22 0437 09/13/22 0500 09/14/22 0420 09/14/22 0424  NA 137  --  136  --  134* 135  --   --  135  K 3.5  --  3.3*  --  4.3 3.9  --   --  4.2  CL 98  --  100  --  100 101  --   --  100  CO2 27  --  26  --  27 29  --   --  27  GLUCOSE 119*  --  109*  --  106* 98  --   --  96  BUN 20  --  23  --  24* 16  --   --  15  CREATININE 1.97*  --  2.16*  --  1.47* 1.14*  --   --  1.24*  CALCIUM 9.3  --  8.6*  --  8.6* 8.5*  --   --  8.7*  MG  --  2.5*  --   --   --   --  2.4 2.3  --   PHOS  --  1.9*  --  2.2* 2.1* 1.7*  --   --  1.5*    GFR: Estimated Creatinine Clearance: 49.8 mL/min (A) (by C-G formula based on SCr of 1.24 mg/dL (H)). Recent Labs  Lab 09/11/22 0436 09/11/22 0444 09/12/22 0430 09/13/22 0437 09/14/22 0420  PROCALCITON 0.32  --  0.34 0.27  --   WBC  --  11.4* 10.6* 9.3 9.4     Liver Function Tests: Recent Labs  Lab 09/10/22 1329 09/10/22 1720 09/13/22 0437 09/14/22 0424  AST 31 33  --   --   ALT 17 17  --   --   ALKPHOS 193* 203*  --   --   BILITOT 1.8* 2.3*  --   --   PROT 8.1 8.5*  --   --   ALBUMIN 4.0 4.2 3.3* 3.4*    No results for input(s): "LIPASE", "AMYLASE" in the last 168 hours. No results for input(s): "AMMONIA" in the last 168  hours.  ABG No results found for: "PHART", "PCO2ART", "PO2ART", "HCO3", "TCO2", "ACIDBASEDEF", "O2SAT"   Coagulation Profile: Recent Labs  Lab 09/10/22 2152 09/11/22 0444 09/12/22 0430 09/13/22 0437 09/14/22 0420  INR 1.4* 1.3* 1.2 1.5* 1.6*     Cardiac Enzymes: No results for input(s): "CKTOTAL", "CKMB", "CKMBINDEX", "TROPONINI" in the last 168 hours.  HbA1C: Hgb A1c MFr Bld  Date/Time Value Ref Range Status  08/20/2022 01:50 PM 6.2 (H) 4.8 - 5.6 % Final    Comment:    (NOTE)  Prediabetes: 5.7 - 6.4         Diabetes: >6.4         Glycemic control for adults with diabetes: <7.0   12/14/2019 09:09 AM 5.9 (H) 4.8 - 5.6 % Final    Comment:             Prediabetes: 5.7 - 6.4          Diabetes: >6.4          Glycemic control for adults with diabetes: <7.0     CBG: Recent Labs  Lab 09/11/22 0307  GLUCAP 109*     Review of Systems:    10 point ROS done and is negative except as per HPI and dyspnea   Past Medical History  She,  has a past medical history of AICD (automatic cardioverter/defibrillator) present, Anterior myocardial infarction [facility]), Arthritis, Cancer of upper lobe of right lung (HCC) (09/2017), Cervical cancer (HCC), Chronic combined systolic and diastolic CHF (congestive heart failure) (HCC), Coronary artery disease, Essential hypertension, Ischemic cardiomyopathy, Mass of upper lobe of right lung, Mitral valve disease, and Personal history of radiation therapy.   Surgical History    Past Surgical History:  Procedure Laterality Date   APPENDECTOMY  1998   BREAST EXCISIONAL BIOPSY Left 80s   benign   CARDIAC CATHETERIZATION     CARDIAC DEFIBRILLATOR PLACEMENT     COLONOSCOPY WITH PROPOFOL N/A 01/11/2020   Procedure: COLONOSCOPY WITH PROPOFOL;  Surgeon: Wyline Mood, MD;  Location[facility] ENDOSCOPY;  Service: Gastroenterology;  Laterality: N/A;   IR IMAGING GUIDED PORT INSERTION  06/29/2022   PORTA CATH INSERTION     RADICAL HYSTERECTOMY   1998   REPLACEMENT TOTAL KNEE Right 12/10/2021   SHOULDER SURGERY Bilateral    rotator cuff tears   VALVE REPLACEMENT     Mitral valve; 27 mm [employer] Jude bileaflet valve     Social History   reports that she quit smoking about 26 years ago. Her smoking use included cigarettes. She has a 4.00 pack-year smoking history. She has been exposed to tobacco smoke. She has never used smokeless tobacco. She reports that she does not drink alcohol and does not use drugs.   Family History   Her family history includes Breast cancer in her maternal aunt and maternal aunt; Colon cancer in her maternal uncle; Diabetes in her maternal grandmother and mother; Emphysema in her father; Heart attack in her mother; Hypertension in her mother; Kidney disease in her maternal grandmother.   Allergies No Known Allergies   Home Medications  Prior to Admission medications   Medication Sig Start Date End Date Taking? Authorizing Provider  aspirin 81 MG chewable tablet Chew 81 mg by mouth daily.   Yes [provider]  atorvastatin (LIPITOR) 80 MG tablet TAKE 1 TABLET BY MOUTH ONCE DAILY. OFFICE VISIT AND LABS NEEDED FOR FURTHER REFILLS. 08/31/22  Yes Bacigalupo, Marzella Schlein, MD  ENTRESTO 24-26 MG TAKE 1 TABLET BY MOUTH TWICE DAILY *SCHEDULE OFFICE VISIT FOR FURTHER REFILLS* 08/02/22  Yes End, Cristal Deer, MD  ezetimibe (ZETIA) 10 MG tablet Take 1 tablet (10 mg total) by mouth daily. 07/12/22  Yes End, Cristal Deer, MD  furosemide (LASIX) 40 MG tablet Take 2 tablets (80 mg total) by mouth 2 (two) times daily. 01/21/22  Yes End, Cristal Deer, MD  gabapentin (NEURONTIN) 300 MG capsule Take 1 capsule (300 mg total) by mouth 2 (two) times daily. 10/26/21  Yes Bacigalupo, Marzella Schlein, MD  lidocaine-prilocaine (EMLA) cream Apply to affected area once  06/25/22  Yes Creig Hines, MD  metoprolol succinate (TOPROL-XL) 25 MG 24 hr tablet TAKE 1 TABLET BY MOUTH TWICE DAILY IN THE MORNING AND AT BEDTIME 06/21/22  Yes End, Cristal Deer,  MD  ondansetron (ZOFRAN) 8 MG tablet Take 1 tablet (8 mg total) by mouth every 8 (eight) hours as needed for nausea or vomiting. 06/25/22  Yes Creig Hines, MD  oxyCODONE (OXY IR/ROXICODONE) 5 MG immediate release tablet Take 1 tablet (5 mg total) by mouth every 4 (four) hours as needed for severe pain. 09/10/22  Yes Creig Hines, MD  prochlorperazine (COMPAZINE) 10 MG tablet Take 1 tablet (10 mg total) by mouth every 6 (six) hours as needed for nausea or vomiting. 06/25/22  Yes Creig Hines, MD  spironolactone (ALDACTONE) 25 MG tablet Take 1 tablet (25 mg total) by mouth daily. 04/27/22  Yes Caro Laroche, DO  warfarin (COUMADIN) 5 MG tablet 5 mg daily except 7.5 mg on Mon and Fri Patient taking differently: Take 5 mg by mouth every evening. 05/14/22  Yes Drubel, Lillia Abed, PA-C  metolazone (ZAROXOLYN) 2.5 MG tablet Take 1 tablet (2.5 mg) by mouth once a week. Take 30 minutes prior to your furosemide dose. Patient not taking: Reported on 09/10/2022 05/20/22   End, Cristal Deer, MD  oxybutynin (DITROPAN-XL) 10 MG 24 hr tablet Take 1 tablet (10 mg total) by mouth daily. Patient not taking: Reported on 09/10/2022 06/21/22   Alfredo Martinez, MD  potassium chloride SA (KLOR-CON M) 20 MEQ tablet Take 2 tablets (40 mEq total) by mouth daily. Patient not taking: Reported on 09/10/2022 05/20/22   End, Cristal Deer, MD  Scheduled Meds:  atorvastatin  80 mg Oral Daily   Chlorhexidine Gluconate Cloth  6 each Topical Daily   ezetimibe  10 mg Oral Daily   feeding supplement  237 mL Oral BID BM   gabapentin  300 mg Oral BID   metoprolol succinate  25 mg Oral Daily   midodrine  10 mg Oral TID WC   multivitamin with minerals  1 tablet Oral Daily   warfarin  5 mg Oral ONCE-1600   Warfarin - Pharmacist Dosing Inpatient   Does not apply q1600   Continuous Infusions:  sodium chloride Stopped (09/11/22 0721)   heparin 1,000 Units/hr (09/14/22 0108)   sodium phosphate 30 mmol in dextrose 5 % 250 mL infusion      PRN Meds:.acetaminophen **OR** acetaminophen, ondansetron **OR** ondansetron (ZOFRAN) IV, oxyCODONE, senna-docusate, sodium chloride flush   Active Hospital Problem list     Assessment & Plan:  #Acute on Chronic Respiratory Failure #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  Now presenting with hemoptysis and shortness of breath. Repeat CT doesn't show any dense consolidations to suggest superimposed bacterial pneumonia.  However, there progressive lung cancer with large multi chelated right pleural effusion which appears to have increase in size with significant compressive atelectasis in the right middle and lower lobes and additional new and enlarging left lung pulmonary nodules consistent with progressive metastatic disease.  -Supplemental O2 as needed to maintain O2 saturations 88 to 92% -Follow intermittent ABG and chest x-ray as needed -As needed bronchodilators -IR consult for thoracentesis, Last thoracentesis on 12/28 yielded 1.4 L -Palliative care consult  # Hemoptysis in the setting of supra therapeutic INR INR on admit>10 S/p Kcentral and Vitamin K Current INR -Hold anticoagulation for now pending  possible thoracentesis as above -Follow PT/INR  Hypotension concern for Sepsis -F/u cultures, trend lactic/ PCT -Monitor WBC/ fever curve -Hold empiric IV antibiotics for now  -Pressors for MAP goal >65, has not required pressors -Strict I/O's   # AKI likely ATN in the setting of above # Hypokalemia -Monitor I&O's / urinary output -Follow BMP -Ensure adequate renal perfusion -Avoid nephrotoxic agents as able -Replace electrolytes as indicated   #Chronic HFrEF (LVEF 25-30% with global hypokinesis ) #Hypertension #Atrial Fibrillation Hx: CAD s/p LHC (2016), STEMI, Ischemic Cardiomyopathy, HLD  -Continuous  cardiac monitoring -Maintain MAP greater than 65 -IV Lasix as blood pressure and renal function permits; currently on Lasix 40 mg BID -Hold metoprolol, Entresto, and spironolactone for now in the setting of hypotension and AKI -Hold Coumadin due to supratherapeutic INR -Continue aspirin 81 mg and atorvastatin 80 mg/day -Consider cardiology consult -Repeat 2D Echocardiogram     Best practice:  Diet:  Oral Pain/Anxiety/Delirium protocol (if indicated): No VAP protocol (if indicated): Not indicated DVT prophylaxis: Contraindicated GI prophylaxis: PPI Glucose control:  SSI No Central venous access:  N/A Arterial line:  N/A Foley:  N/A Mobility:  bed rest  PT consulted: N/A Last date of multidisciplinary goals of care discussion [1/12] Code Status:  full code Disposition: Stepdown   = Goals of Care = Code Status Order: FULL  Primary Emergency Contact: Gordy,Tosin, Home Phone: [phone] Wishes to pursue full aggressive treatment and intervention options, including CPR and intubation, but goals of care will be addressed on going with family if that should become necessary.   Critical care provider statement:   Total critical care time: 33 minutes   Performed by: Karna Christmas MD   Critical care time was exclusive of separately billable procedures and treating other patients.   Critical care was necessary to treat or prevent imminent or life-threatening deterioration.   Critical care was time spent personally by me on the following activities: development of treatment plan with patient and/or surrogate as well as nursing, discussions with consultants, evaluation of patient's response to treatment, examination of patient, obtaining history from patient or surrogate, ordering and performing treatments and interventions, ordering and review of laboratory studies, ordering and review of radiographic studies, pulse oximetry and re-evaluation of patient's condition.    Vida Rigger, M.D.  Pulmonary & Critical Care Medicine

## 2022-09-14 NOTE — Progress Notes (Signed)
PROGRESS NOTE   Vanessa Carlson  WUJ:[id number]    DOB: November 14, 1960    DOA: 09/10/2022  PCP: Virginia Crews, MD   I have briefly reviewed patients previous medical records in [facility].  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  In ED, INR >10, hemodynamically stable and not hypoxic.  CT C/A/P without contrast showing progressive RUL mass/lung cancer, large multiloculated right pleural effusion with compressive atelectasis, progressive metastatic lung disease and possible liver mets.  She was given a dose of Kcentra and vitamin K 10 mg on 1/12 and admitted to ICU.  PCCM was consulted.  No further hemoptysis.  INR down to 1.3.  Initiating IV heparin with close monitoring due to mechanical MVR.  Cardiology consulted.  IR consulted and s/p ultrasound-guided right thoracentesis yielding 1.3 L of dark bloody fluid, suspected malignant pleural effusion.   Assessment & Plan:  Principal Problem:   Supratherapeutic INR Active Problems:   HTN (hypertension)   Coronary artery disease involving native coronary artery of native heart without angina pectoris   Paroxysmal atrial fibrillation (HCC)   Chronic anticoagulation   Morbid obesity (HCC)   Hyperlipidemia LDL goal <70   Primary lung adenocarcinoma, right (HCC)   Pleural effusion   Hypotension   AKI (acute kidney injury) ([name])   Elevated bilirubin   Hemoptysis/supratherapeutic  INR: Secondary to supratherapeutic INR in the context of progressive stage IV lung cancer.  Elevated INR felt to be due to poor oral intake.  S/p Kcentra and IV vitamin K 10 mg on 1/12.  INR down from >10 to 1.3.  Reports only a single episode of handful of hemoptysis on 1/11 and no recurrence since hospital admission.  She is on anticoagulation for mechanical MVR and A-fib.  She remains at high risk for recurrent hemoptysis from her underlying lung cancer.  At the same time, also at high risk for clotting of her mechanical valve.  This was discussed in detail with patient, risks and benefits of anticoagulation including catastrophic bleeding risks and even death versus no anticoagulation and severe thromboembolic complications.  She is on IV heparin bridging and warfarin, managed by pharmacy.  INR up to 1.6.  Hemoglobin stable.  Based on chart review, it appears that palliative care medicine is already on board as outpatient.    Acute kidney injury: Serum creatinine 0.94 on 12/1.  Since then has progressively increased, 1.81 on admission and peaked to 2.16.  No recent contrast for CT.  May be related to hypotension/ATN.  Avoid nephrotoxic's.  Resolved.  Creatinine down to 1.14 >1.24, follow BMP in AM  Hypokalemia:  Replaced.  Magnesium 2.3.  Hypophosphatemia Phosphorus 1.9 >2.1 >1.7 >1.5.  May need to replace aggressively IV, pharmacy has been consulted for same.  Hypotension: Unclear etiology.?  Poor oral intake and meds, now on hold (Entresto, and spironolactone).  S/p 1 L IV fluid bolus.  Midodrine 10 Mg 3 times daily has been initiated.  Blood pressures mostly SBP in the 100s-110s.  Cardiology of initiated Toprol-XL  25 Mg daily.  Large multiloculated right pleural effusion: Last had thoracentesis on 12/28.  S/p 1.3 L of ultrasound-guided thoracentesis 1/15, dark bloody fluid, suspect malignant pleural effusion.  As per discussion with pulmonology today, likely malignant pleural effusion, no lab  work was thereby sent, may need Pleurx catheter if reaccumulate's quickly.  Appears that palliative care is following as outpatient.  Hyperlipidemia: Continue atorvastatin and Zetia  Paroxysmal atrial fibrillation In sinus rhythm.  Anticoagulation discussion as above.  High risk anticoagulation candidate.  Cardiology has restarted Toprol-XL at 25 Mg daily.  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  Now that she has completed thoracentesis, may continue IV heparin bridge with warfarin per pharmacy.  Essential hypertension: Hypotensive on admission.  Antihypertensives and diuretics were held.  Now on Toprol-XL 25 Mg daily.  SBPs in the 100s-110s.  Chronic systolic CHF/cardiomyopathy: Clinically euvolemic.  TTE : LVEF 20-25%, unchanged from prior.  Lasix on hold.  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  Cardiology consulted to opine regarding anticoagulation.  Anticoagulation discussion as noted above.  Goal INR 2.5-3.5.  DC home pending INR therapeutic in that range.  Do not think Lovenox bridge is indicated for mechanical valves.  Stage IV adenocarcinoma of lung with malignant right pleural effusion, pulmonary metastasis and possible liver mets. Outpatient follow-up with [name], oncology and palliative care medicine  Chronic low back pain Controlled on oxycodone.  Discussed with RN regarding mobilizing/out of bed to chair.  Isolated hyperbilirubinemia: Unclear etiology.?  Liver mets related.  Follow LFT's periodically.  Body mass index is 37.3 kg/m./Obesity    DVT prophylaxis: Place TED hose Start: 09/10/22 1856     Code Status: Full Code:  Family Communication: None at bedside this morning. Disposition:  DC pending no further nausea or vomiting, therapeutic INR which may take a couple more days.     Consultants:   Pulmonology Cardiology Interventional radiology  Procedures:   As  above  Antimicrobials:      Subjective:  States that she had an episode of nausea and vomiting this morning, feels that this is due to multiple meds that she has to take.  Breathing may be slightly better after thoracentesis.  No pain reported.  Objective:   Vitals:   09/13/22 2224 09/14/22 0350 09/14/22 0740 09/14/22 1224  BP: (!) 97/54 111/64 (!) 106/58 114/61  Pulse: (!) 106 (!) 103 (!) 106 95  Resp: 18 16 20 18   Temp: 98.4 F (36.9 C) 98 F (36.7 C) 98.4 F (36.9 C) 98 F (36.7 C)  TempSrc: Oral Oral  Oral  SpO2: 97% 91% 94% 95%  Weight:      Height:        General exam: Young female, moderately built and obese lying propped up in bed without distress. Respiratory system: Slightly improved breath sounds in the right lower lung field but still quite decreased.  RS clear to auscultation without wheezing, rhonchi or crackles.  No increased work of breathing. Cardiovascular system: S1 & S2 heard, RRR. No JVD, murmurs, rubs, gallops or clicks. No pedal edema.  Telemetry personally reviewed: Sinus rhythm in the 90s-sinus tachycardia in the low 100s. Gastrointestinal system: Abdomen is nondistended, soft and nontender. No organomegaly or masses felt. Normal bowel sounds heard. Central nervous system: Alert and oriented. No focal neurological deficits. Extremities: Symmetric 5 x 5 power. Skin: No rashes, lesions or ulcers Psychiatry: Judgement and insight appear normal. Mood & affect appropriate.  Data Reviewed:   I have personally reviewed following labs and imaging studies   CBC: Recent Labs  Lab 09/10/22 1329 09/10/22 1720 09/12/22 0430 09/13/22 0437 09/14/22 0420  WBC 10.6*   < > 10.6* 9.3 9.4  NEUTROABS 7.8*  --   --   --   --   HGB 13.7   < > 11.7* 10.9* 11.8*  HCT 43.1   < > 37.9 34.8* 36.9  MCV 90.0   < > 91.5 91.1 89.3  PLT 246   < > 175 178 152   < > = values in this interval not displayed.    Basic Metabolic Panel: Recent Labs  Lab 09/10/22 1720  09/10/22 1857 09/11/22 0444 09/11/22 1057 09/12/22 0430 09/13/22 0437 09/13/22 0500 09/14/22 0420 09/14/22 0424  NA 137  --  136  --  134* 135  --   --  135  K 3.5  --  3.3*  --  4.3 3.9  --   --  4.2  CL 98  --  100  --  100 101  --   --  100  CO2 27  --  26  --  27 29  --   --  27  GLUCOSE 119*  --  109*  --  106* 98  --   --  96  BUN 20  --  23  --  24* 16  --   --  15  CREATININE 1.97*  --  2.16*  --  1.47* 1.14*  --   --  1.24*  CALCIUM 9.3  --  8.6*  --  8.6* 8.5*  --   --  8.7*  MG  --  2.5*  --   --   --   --  2.4 2.3  --   PHOS  --  1.9*  --  2.2* 2.1* 1.7*  --   --  1.5*    Liver Function Tests: Recent Labs  Lab 09/10/22 1329 09/10/22 1720 09/13/22 0437 09/14/22 0424  AST 31 33  --   --   ALT 17 17  --   --   ALKPHOS 193* 203*  --   --   BILITOT 1.8* 2.3*  --   --   PROT 8.1 8.5*  --   --   ALBUMIN 4.0 4.2 3.3* 3.4*    CBG: Recent Labs  Lab 09/11/22 0307  GLUCAP 109*    Microbiology Studies:   Recent Results (from the past 240 hour(s))  MRSA Next Gen by PCR, Nasal     Status: None   Collection Time: 09/11/22  3:24 AM   Specimen: Nasal Mucosa; Nasal Swab  Result Value Ref Range Status   MRSA by PCR Next Gen NOT DETECTED NOT DETECTED Final    Comment: (NOTE) The GeneXpert MRSA Assay (FDA approved for NASAL specimens only), is one component of a comprehensive MRSA colonization surveillance program. It is not intended to diagnose MRSA infection nor to guide or monitor treatment for MRSA infections. Test performance is not FDA approved in patients less than 56 years old. Performed at [facility], [address] [place], [place] [address]   Culture, blood (Routine X 2) w Reflex to ID Panel     Status: None (Preliminary result)   Collection Time: 09/11/22  7:30 AM   Specimen: BLOOD  Result Value Ref Range Status   Specimen Description BLOOD BLOOD LEFT ARM  Final   Special Requests   Final  BOTTLES DRAWN AEROBIC ONLY Blood Culture  results may not be optimal due to an inadequate volume of blood received in culture bottles   Culture   Final    NO GROWTH 3 DAYS Performed at [facility], [address] [place], [place] [address]    Report Status PENDING  Incomplete  Culture, blood (Routine X 2) w Reflex to ID Panel     Status: None (Preliminary result)   Collection Time: 09/11/22  7:44 AM   Specimen: BLOOD  Result Value Ref Range Status   Specimen Description BLOOD BLOOD RIGHT HAND  Final   Special Requests   Final    BOTTLES DRAWN AEROBIC AND ANAEROBIC Blood Culture adequate volume   Culture   Final    NO GROWTH 3 DAYS Performed at [facility], [address], [place], [place] [address]    Report Status PENDING  Incomplete    Radiology Studies:  US THORACENTESIS ASP PLEURAL SPACE W/IMG GUIDE  Result Date: 09/13/2022 INDICATION: Patient with history of non-small cell lung cancer with recurrent right pleural effusion. Request received for therapeutic thoracentesis. EXAM: ULTRASOUND GUIDED THERAPEUTIC RIGHT THORACENTESIS MEDICATIONS: 10 mL 1 % lidocaine COMPLICATIONS: None immediate. PROCEDURE: An ultrasound guided thoracentesis was thoroughly discussed with the patient and questions answered. The benefits, risks, alternatives and complications were also discussed. The patient understands and wishes to proceed with the procedure. Written consent was obtained. Ultrasound was performed to localize and mark an adequate pocket of fluid in the right chest. The area was then prepped and draped in the normal sterile fashion. 1% Lidocaine was used for local anesthesia. Under ultrasound guidance a 6 Fr Safe-T-Centesis catheter was introduced. Thoracentesis was performed. The catheter was removed and a dressing applied. FINDINGS: A total of approximately 1.3 L of dark, bloody fluid was removed. IMPRESSION: Successful ultrasound guided right thoracentesis yielding 1.3 L of pleural fluid. Read by: Alex Gardener,  AGNP-BC Electronically Signed   By: Acquanetta Belling M.D.   On: 09/13/2022 14:39   DG Chest Port 1 View  Result Date: 09/13/2022 CLINICAL DATA:  Post thora EXAM: PORTABLE CHEST - 1 VIEW COMPARISON:  CT 09/10/2022 FINDINGS: No pneumothorax. Persistent moderate right pleural effusion, with atelectasis/consolidation at the right lung base. Left lung clear. Stable right IJ port catheter to the distal SVC. Stable left subclavian AICD. Heart size and mediastinal contours are within normal limits. Post MVR. Sternotomy wires. IMPRESSION: Persistent moderate right pleural effusion. No pneumothorax post thoracentesis. Electronically Signed   By: Corlis Leak M.D.   On: 09/13/2022 14:11    Scheduled Meds:    (feeding supplement) PROSource Plus  30 mL Oral BID BM   atorvastatin  80 mg Oral Daily   Chlorhexidine Gluconate Cloth  6 each Topical Daily   ezetimibe  10 mg Oral Daily   feeding supplement  237 mL Oral BID BM   gabapentin  300 mg Oral BID   metoprolol succinate  25 mg Oral Daily   midodrine  10 mg Oral TID WC   multivitamin with minerals  1 tablet Oral Daily   warfarin  5 mg Oral ONCE-1600   Warfarin - Pharmacist Dosing Inpatient   Does not apply q1600    Continuous Infusions:    sodium chloride 250 mL (09/14/22 1144)   heparin 1,000 Units/hr (09/14/22 0108)   sodium phosphate 30 mmol in dextrose 5 % 250 mL infusion 30 mmol (09/14/22 1148)     LOS: 4 days     [facility],  MD,  FACP, FHM, SFHM, CMPC, [facility]   [facility] Advisor [place]     To contact the attending provider between 7A-7P or the covering provider during after hours 7P-7A, please log into the web site [email or web address] and access using universal [place] password for that web site. If you do not have the password, please call the hospital operator.  09/14/2022, 2:02 PM

## 2022-09-14 NOTE — Progress Notes (Signed)
Mobility Specialist - Progress Note    09/14/22 0832  Mobility  Activity Ambulated with assistance in room  Level of Assistance Standby assist, set-up cues, supervision of patient - no hands on  Assistive Device Front wheel walker  Distance Ambulated (ft) 10 ft  Activity Response Tolerated well  Mobility Referral Yes  $Mobility charge 1 Mobility   Pt resting in bed on RA upon entry. Pt STS and ambulates around bed ti recliner SBA to RW. Pt left with needs in reach and bed alarm activated.   Vanessa Carlson Mobility Specialist 09/14/22, 8:34 AM

## 2022-09-14 NOTE — Progress Notes (Signed)
PHARMACY CONSULT NOTE - ELECTROLYTES  Pharmacy Consult for Electrolyte Monitoring and Replacement   Recent Labs: Potassium (mmol/L)  Date Value  09/14/2022 4.2   Magnesium (mg/dL)  Date Value  33/88/3262 2.3   Calcium (mg/dL)  Date Value  35/66/8871 8.7 (L)   Albumin (g/dL)  Date Value  09/78/0221 3.4 (L)  08/18/2018 4.2   Phosphorus (mg/dL)  Date Value  94/80/8239 1.5 (L)   Sodium (mmol/L)  Date Value  09/14/2022 135  12/19/2019 145 (H)   Corrected Ca: 9.2 mg/dL  Assessment  Vanessa Carlson is a 62 y.o. female presenting with hemoptysis with INR >10. 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 Pharmacy has been consulted to monitor and replace phosphorous.  Diet: Regular  Goal of Therapy: Electrolytes WNL  Plan: Phos 1.5 today 1/16 30 mmol IV sodium phosphate x 1 Recheck electrolytes in am  Thank you for allowing pharmacy to be a part of this patient's care.  Lupe Bonner Rodriguez-Guzman PharmD, BCPS 09/14/2022 8:18 AM

## 2022-09-14 NOTE — Care Management Important Message (Signed)
Important Message  Patient Details  Name: Vanessa Carlson MRN: [id number] Date of Birth: [birth year]   Medicare Important Message Given:  Yes     Johnell Comings 09/14/2022, 11:52 AM

## 2022-09-14 NOTE — Progress Notes (Signed)
ANTICOAGULATION CONSULT NOTE  Pharmacy Consult for Heparin Infusion with Bridge to Warfarin Indication: mMVR, AFib  No Known Allergies  Patient Measurements: Height: 5\' 2"  (157.5 cm) Weight: 92.5 kg (203 lb 14.8 oz) IBW/kg (Calculated) : 50.1 Heparin Dosing Weight: 71.6 kg  Vital Signs: Temp: 98.4 F (36.9 C) (01/16 0740) Temp Source: Oral (01/16 0350) BP: 106/58 (01/16 0740) Pulse Rate: 106 (01/16 0740)  Labs: Recent Labs    09/11/22 1101 09/11/22 1737 09/12/22 0021 09/12/22 0430 09/12/22 0430 09/13/22 0437 09/14/22 0420 09/14/22 0424  HGB  --   --   --  11.7*   < > 10.9* 11.8*  --   HCT  --   --   --  37.9  --  34.8* 36.9  --   PLT  --   --   --  175  --  178 152  --   APTT 36  --   --   --   --   --   --   --   LABPROT  --   --   --  15.5*  --  17.7* 19.1*  --   INR  --   --   --  1.2  --  1.5* 1.6*  --   HEPARINUNFRC  --    < > 0.54  --   --  0.67 0.36  --   CREATININE  --   --   --  1.47*  --  1.14*  --  1.24*   < > = values in this interval not displayed.     Estimated Creatinine Clearance: 49.8 mL/min (A) (by C-G formula based on SCr of 1.24 mg/dL (H)).   Medical History: Past Medical History:  Diagnosis Date   AICD (automatic cardioverter/defibrillator) present    Anterior myocardial infarction [facility])    a. 10/2014 - occluded LAD, complicated by cardiogenic shock-->Med Rx as interventional team was unable to open LAD.   Arthritis    arthirits of knee on right   Cancer of upper lobe of right lung (HCC) 09/2017   radiation therapy right side   Cervical cancer (HCC)    in remission for ~20 yrs, s/p hysterectomy   Chronic combined systolic and diastolic CHF (congestive heart failure) (HCC)    a. 10/2015 Echo: EF 20-25%, sev diast dysfxn, mildly reduced RV fxn. Nl MV prosthesis; b. 12/2017 Echo: EF 20-25%, diff HK. Sev ant/antsept HK, apical AK. Mild MS (mean graad 01/2018). Mod TR. PASP .   Coronary artery disease    a. 10/2014 Ant STEMI/Cath: LAD 100p  ->Med managed as lesion could not be crossed-->complicated by CGS and post-MI pericarditis (UVA).   Essential hypertension    Ischemic cardiomyopathy    a. 03/2015 s/p MDT single lead AICD;  b. 10/2015 Echo: EF 20-25%;  c. 12/2017 Echo: EF 20-25%, diff HK.   Mass of upper lobe of right lung    a. noted on CXR & CT 08/2017 w/ abnl PET CT.   Mitral valve disease    a. 2006 s/p MVR w/ 2mm SJM bileaflet mechanical valve-->chronic coumadin/ASA; b. 12/2017 Echo: Mild MS. Mean gradient 01/2018.   Personal history of radiation therapy    f/u lung ca    Medications:  Scheduled:   atorvastatin  80 mg Oral Daily   Chlorhexidine Gluconate Cloth  6 each Topical Daily   ezetimibe  10 mg Oral Daily   feeding supplement  237 mL Oral BID BM   gabapentin  300 mg Oral  BID   metoprolol succinate  25 mg Oral Daily   midodrine  10 mg Oral TID WC   multivitamin with minerals  1 tablet Oral Daily   Warfarin - Pharmacist Dosing Inpatient   Does not apply q1600   Infusions:   sodium chloride Stopped (09/11/22 0721)   heparin 1,000 Units/hr (09/14/22 0108)   PRN: acetaminophen **OR** acetaminophen, ondansetron **OR** ondansetron (ZOFRAN) IV, oxyCODONE, senna-docusate, sodium chloride flush  PTA Warfarin: 35 mg/wk 5 mg daily   Assessment: Vanessa Carlson is a 62 y.o. female presenting with hemoptysis with INR >10. 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 Patient was on [facility] PTA per chart review.  Patient's INR was supratherapeutic on home regimen. Warfarin was reversed with Kcentra and vitamin K 10 mg on 1/12, and patient was admitted to ICU. Pharmacy has been consulted to initiate and manage heparin infusion and warfarin.   Baseline Labs: aPTT 36, PT 16.4, INR 1.3, Hgb 12.9, Hct 41.2, Plt 200   Goal of Therapy:  INR  2.5-3.5 Heparin level 0.3-0.7 units/ml Monitor platelets by anticoagulation protocol: Yes   Monitoring Heparin:  Date Time HL Rate/Comment  1/13 1737 0.39 Therapeutic x1 1/14 0021 0.54 Therapeutic x 2 1/15 0437 0.67  Therapeutic x 3 1/15 0420 0.36 Therapeutic x 4  Warfarin: Date    INR      Warfarin Dose    1/13 1.3 5 mg 1/11 @ 2200 then Kcentra + vitK 10 mg 1/12 @ 2000 1/14 1.2 --- 1/15 1.5 7.5mg  1/16 1.6   Plan:  Heparin Continue heparin infusion at 1000 units/hr  Recheck heparin level once daily w/ AM labs Continue to monitor H&H and platelets daily while on heparin infusion  Warfarin Give warfarin 5 mg x1 dose today  Continue on parenteral bridge therapy until INR therapeutic  Check INR daily until stable Check CBC at least every 3 days while inpatient  [name] PharmD, BCPS 09/14/2022 8:12 AM

## 2022-09-14 NOTE — Progress Notes (Signed)
Nutrition Follow-up  DOCUMENTATION CODES:   Obesity unspecified  INTERVENTION:   -Continue Ensure Enlive po BID, each supplement provides 350 kcal and 20 grams of protein -Continue MVI with minerals daily -30 ml Prosource Plus BID, each supplement provides 100 kcals and 15 grams protein -Liberalize diet to regular for wider variety of meal selections  NUTRITION DIAGNOSIS:   Inadequate oral intake related to inability to eat as evidenced by meal completion < 50%.  Ongoing  GOAL:   Patient will meet greater than or equal to 90% of their needs  Progressing   MONITOR:   PO intake, Supplement acceptance  REASON FOR ASSESSMENT:   Malnutrition Screening Tool    ASSESSMENT:   62 y.o. female admits related to elevated INR. PMH includes: lung adenocarcinoma, HTN, HLD, afib.  1/15- s/p rt thoracentesis (1.3 L removed)  Reviewed I/O's: +167 ml x 24 hours and +474 ml since admission  UOP: 700 ml x 24 hours   Pt lying in bed at time of visit, with her eyes closed. Pt awoke briefly when name was called, but states that she is just trying to sleep. Noted pt consumed a few bites off of meal tray. Documented meal completions 5-50%. Pt with variable acceptance of supplements, stating she has nausea.   Reviewed wt hx; pt has experienced a 7.5% wt loss over the past 2 mnths, which is significant for time frame. Suspect some wt loss may be related to fluid changes from CHF.   Medications reviewed.   Labs reviewed: CBGS: 109.   NUTRITION - FOCUSED PHYSICAL EXAM:  Flowsheet Row Most Recent Value  Upper Arm Region No depletion  Thoracic and Lumbar Region No depletion  Buccal Region No depletion  Temple Region No depletion  Clavicle Bone Region No depletion  Clavicle and Acromion Bone Region No depletion  Scapular Bone Region No depletion  Dorsal Hand No depletion  Patellar Region No depletion  Anterior Thigh Region No depletion  Posterior Calf Region No depletion  Edema (RD  Assessment) None  Hair Reviewed  Eyes Reviewed  Mouth Reviewed  Skin Reviewed  Nails Reviewed       Diet Order:   Diet Order             Diet Heart Room service appropriate? Yes; Fluid consistency: Thin  Diet effective now                   EDUCATION NEEDS:   Not appropriate for education at this time  Skin:  Skin Assessment: Reviewed RN Assessment  Last BM:  09/13/22  Height:   Ht Readings from Last 1 Encounters:  09/11/22 5\' 2"  (1.575 m)    Weight:   Wt Readings from Last 1 Encounters:  09/11/22 92.5 kg   BMI:  Body mass index is 37.3 kg/m.  Estimated Nutritional Needs:   Kcal:  1750-1950  Protein:  100-115 grams  Fluid:  > 1.7 L    09/13/22, RD, LDN, CDCES Registered Dietitian II Certified Diabetes Care and Education Specialist Please refer to [facility] for RD and/or RD on-call/weekend/after hours pager

## 2022-09-14 NOTE — TOC Initial Note (Signed)
Transition of Care [facility]) - Initial/Assessment Note    Patient Details  Name: Vanessa Carlson MRN: [id number] Date of Birth: 1961-08-21  Transition of Care [facility]) CM/SW Contact:    Truddie Hidden, RN Phone Number: 09/14/2022, 11:22 AM  Clinical Narrative:               Transition of Care (TOC) Screening Note   Patient Details  Name: Vanessa Carlson Date of Birth: 02-Aug-1961   Transition of Care [facility]) CM/SW Contact:    Truddie Hidden, RN Phone Number: 09/14/2022, 11:22 AM    Transition of Care Department (TOC) has reviewed patient and no TOC needs have been identified at this time. We will continue to monitor patient advancement through interdisciplinary progression rounds. If new patient transition needs arise, please place a TOC consult.         Patient Goals and CMS Choice            Expected Discharge Plan and Services                                              Prior Living Arrangements/Services                       Activities of Daily Living Home Assistive Devices/Equipment: Dentures (specify type), Eyeglasses ADL Screening (condition at time of admission) Patient's cognitive ability adequate to safely complete daily activities?: Yes Is the patient deaf or have difficulty hearing?: No Does the patient have difficulty seeing, even when wearing glasses/contacts?: No Does the patient have difficulty concentrating, remembering, or making decisions?: Yes Patient able to express need for assistance with ADLs?: Yes Does the patient have difficulty dressing or bathing?: No Independently performs ADLs?: Yes (appropriate for developmental age) Does the patient have difficulty walking or climbing stairs?: Yes Weakness of Legs: Both Weakness of Arms/Hands: Right  Permission Sought/Granted                  Emotional Assessment              Admission diagnosis:  Supratherapeutic INR [R79.1] Hemoptysis [R04.2] Patient Active  Problem List   Diagnosis Date Noted   Supratherapeutic INR 09/10/2022   Pleural effusion 09/10/2022   Hypotension 09/10/2022   AKI (acute kidney injury) (HCC) 09/10/2022   Elevated bilirubin 09/10/2022   Prediabetes 08/09/2022   Abnormal brain MRI 07/12/2022   Primary lung adenocarcinoma, right (HCC) 06/08/2022   Goals of care, counseling/discussion 06/08/2022   NSVT (nonsustained ventricular tachycardia) (HCC) 05/20/2022   Palpitations 05/20/2022   Tinnitus of right ear 04/23/2022   Skin lesion 04/23/2022   Rheumatic fever 10/29/2021   Cardiomyopathy (HCC) 10/29/2021   Mitral valve disorder 10/29/2021   Osteoarthritis of knee 09/15/2021   Vaginal discharge 08/04/2021   Enlarged thyroid 08/04/2021   Overactive bladder 08/04/2021   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) 12/14/2019   Lung cancer (HCC) 05/31/2018   Hyperlipidemia LDL goal <70 03/22/2018   Morbid obesity (HCC) 12/22/2017   Insomnia 12/07/2017   OA (osteoarthritis) of knee 07/08/2017   Chronic anticoagulation 07/08/2017   Paroxysmal atrial fibrillation (HCC)    Ischemic cardiomyopathy 02/24/2017   H/O mitral valve replacement with mechanical valve 02/24/2017   Coronary artery disease involving native coronary artery of native heart without angina pectoris    Chronic HFrEF (heart failure with reduced ejection  fraction) (HCC) 01/02/2017   HTN (hypertension) 01/02/2017   PCP:  Erasmo Downer, MD Pharmacy:   [facility] [address] ([address]), [place] - [address] [place]) [place] [address] Phone: [phone] Fax: [phone]     Social Determinants of Health (SDOH) Social History: SDOH Screenings   Food Insecurity: No Food Insecurity (09/11/2022)  Housing: Low Risk  (09/11/2022)  Transportation Needs: No Transportation Needs (09/11/2022)  Utilities: Not At Risk (09/11/2022)  Alcohol Screen: Low Risk  (08/09/2022)  Depression (PHQ2-9): Low Risk  (08/09/2022)   Financial Resource Strain: Low Risk  (10/29/2021)  Physical Activity: Insufficiently Active (10/29/2021)  Social Connections: Moderately Integrated (10/29/2021)  Stress: No Stress Concern Present (10/29/2021)  Tobacco Use: Medium Risk (09/10/2022)   SDOH Interventions:     Readmission Risk Interventions     No data to display

## 2022-09-14 NOTE — Progress Notes (Signed)
Rounding Note    Patient Name: Vanessa Carlson Date of Encounter: 09/14/2022  [facility] Cardiologist: Yvonne Kendall, MD   Subjective   Feeling well today.  Breathing improved after thoracentesis yesterday.  No chest pain, palpitations, or edema.  Inpatient Medications    Scheduled Meds:  (feeding supplement) PROSource Plus  30 mL Oral BID BM   atorvastatin  80 mg Oral Daily   Chlorhexidine Gluconate Cloth  6 each Topical Daily   ezetimibe  10 mg Oral Daily   feeding supplement  237 mL Oral BID BM   gabapentin  300 mg Oral BID   metoprolol succinate  25 mg Oral Daily   midodrine  10 mg Oral TID WC   multivitamin with minerals  1 tablet Oral Daily   Warfarin - Pharmacist Dosing Inpatient   Does not apply q1600   Continuous Infusions:  sodium chloride 250 mL (09/14/22 1144)   heparin 1,000 Units/hr (09/14/22 1703)   sodium phosphate 30 mmol in dextrose 5 % 250 mL infusion 43 mL/hr at 09/14/22 1703   PRN Meds: acetaminophen **OR** acetaminophen, ondansetron **OR** ondansetron (ZOFRAN) IV, oxyCODONE, senna-docusate, sodium chloride flush   Vital Signs    Vitals:   09/14/22 0350 09/14/22 0740 09/14/22 1224 09/14/22 1553  BP: 111/64 (!) 106/58 114/61 100/60  Pulse: (!) 103 (!) 106 95 97  Resp: 16 20 18 16   Temp: 98 F (36.7 C) 98.4 F (36.9 C) 98 F (36.7 C) 99.5 F (37.5 C)  TempSrc: Oral  Oral Oral  SpO2: 91% 94% 95% 92%  Weight:      Height:        Intake/Output Summary (Last 24 hours) at 09/14/2022 1728 Last data filed at 09/14/2022 1703 Gross per 24 hour  Intake 945.9 ml  Output 300 ml  Net 645.9 ml      09/11/2022    3:32 AM 09/10/2022    1:58 PM 08/20/2022    2:06 PM  Last 3 Weights  Weight (lbs) 203 lb 14.8 oz 199 lb 208 lb  Weight (kg) 92.5 kg 90.266 kg 94.348 kg      Telemetry    Sinus tachycardia with PVC's - Personally Reviewed  ECG    No new tracing.  Physical Exam   GEN: No acute distress.   Neck: No JVD Cardiac:  Tachycardic but regular with mechanical S1.  No murmurs. Respiratory: Mildly diminished breath sounds.  No wheezes or crackles. GI: Soft, nontender, non-distended  MS: Trace pretibial edema bilaterally; No deformity. Neuro:  Nonfocal  Psych: Normal affect   Labs    High Sensitivity Troponin:  No results for input(s): "TROPONINIHS" in the last 720 hours.   Chemistry Recent Labs  Lab 09/10/22 1329 09/10/22 1720 09/10/22 1857 09/11/22 0444 09/12/22 0430 09/13/22 0437 09/13/22 0500 09/14/22 0420 09/14/22 0424  NA 137 137  --    < > 134* 135  --   --  135  K 3.4* 3.5  --    < > 4.3 3.9  --   --  4.2  CL 97* 98  --    < > 100 101  --   --  100  CO2 27 27  --    < > 27 29  --   --  27  GLUCOSE 132* 119*  --    < > 106* 98  --   --  96  BUN 20 20  --    < > 24* 16  --   --  15  CREATININE 1.81* 1.97*  --    < > 1.47* 1.14*  --   --  1.24*  CALCIUM 9.1 9.3  --    < > 8.6* 8.5*  --   --  8.7*  MG  --   --  2.5*  --   --   --  2.4 2.3  --   PROT 8.1 8.5*  --   --   --   --   --   --   --   ALBUMIN 4.0 4.2  --   --   --  3.3*  --   --  3.4*  AST 31 33  --   --   --   --   --   --   --   ALT 17 17  --   --   --   --   --   --   --   ALKPHOS 193* 203*  --   --   --   --   --   --   --   BILITOT 1.8* 2.3*  --   --   --   --   --   --   --   GFRNONAA 31* 28*  --    < > 40* 54*  --   --  49*  ANIONGAP 13 12  --    < > 7 5  --   --  8   < > = values in this interval not displayed.    Lipids No results for input(s): "CHOL", "TRIG", "HDL", "LABVLDL", "LDLCALC", "CHOLHDL" in the last 168 hours.  Hematology Recent Labs  Lab 09/12/22 0430 09/13/22 0437 09/14/22 0420  WBC 10.6* 9.3 9.4  RBC 4.14 3.82* 4.13  HGB 11.7* 10.9* 11.8*  HCT 37.9 34.8* 36.9  MCV 91.5 91.1 89.3  MCH 28.3 28.5 28.6  MCHC 30.9 31.3 32.0  RDW 16.9* 17.2* 17.2*  PLT 175 178 152   Thyroid No results for input(s): "TSH", "FREET4" in the last 168 hours.  BNP Recent Labs  Lab 09/11/22 0730  BNP 67.2    DDimer  No results for input(s): "DDIMER" in the last 168 hours.   Radiology    US THORACENTESIS ASP PLEURAL SPACE W/IMG GUIDE  Result Date: 09/13/2022 INDICATION: Patient with history of non-small cell lung cancer with recurrent right pleural effusion. Request received for therapeutic thoracentesis. EXAM: ULTRASOUND GUIDED THERAPEUTIC RIGHT THORACENTESIS MEDICATIONS: 10 mL 1 % lidocaine COMPLICATIONS: None immediate. PROCEDURE: An ultrasound guided thoracentesis was thoroughly discussed with the patient and questions answered. The benefits, risks, alternatives and complications were also discussed. The patient understands and wishes to proceed with the procedure. Written consent was obtained. Ultrasound was performed to localize and mark an adequate pocket of fluid in the right chest. The area was then prepped and draped in the normal sterile fashion. 1% Lidocaine was used for local anesthesia. Under ultrasound guidance a 6 Fr Safe-T-Centesis catheter was introduced. Thoracentesis was performed. The catheter was removed and a dressing applied. FINDINGS: A total of approximately 1.3 L of dark, bloody fluid was removed. IMPRESSION: Successful ultrasound guided right thoracentesis yielding 1.3 L of pleural fluid. Read by: Alex Gardener, AGNP-BC Electronically Signed   By: Acquanetta Belling M.D.   On: 09/13/2022 14:39   DG Chest Port 1 View  Result Date: 09/13/2022 CLINICAL DATA:  Post thora EXAM: PORTABLE CHEST - 1 VIEW COMPARISON:  CT 09/10/2022 FINDINGS: No pneumothorax. Persistent moderate right pleural effusion, with  atelectasis/consolidation at the right lung base. Left lung clear. Stable right IJ port catheter to the distal SVC. Stable left subclavian AICD. Heart size and mediastinal contours are within normal limits. Post MVR. Sternotomy wires. IMPRESSION: Persistent moderate right pleural effusion. No pneumothorax post thoracentesis. Electronically Signed   By: Corlis Leak M.D.   On: 09/13/2022 14:11    Cardiac  Studies   TTE (09/11/2022):  1. Left ventricular ejection fraction, by estimation, is 20 to 25%. The  left ventricle has severely decreased function. The left ventricle  demonstrates regional wall motion abnormalities (see scoring  diagram/findings for description). Left ventricular  diastolic function could not be evaluated.   2. Right ventricular systolic function is normal. The right ventricular  size is normal. There is normal pulmonary artery systolic pressure. The  estimated right ventricular systolic pressure is 31.7 mmHg.   3. The mitral valve has been repaired/replaced. No evidence of mitral  valve regurgitation. The mean mitral valve gradient is 2.0 mmHg with  average heart rate of 85 bpm. There is a 27 mm mechanical valve present in  the mitral position. Echo findings are  consistent with normal structure and function of the mitral valve  prosthesis.   4. The aortic valve is tricuspid. Aortic valve regurgitation is not  visualized. No aortic stenosis is present.   5. The inferior vena cava is normal in size with greater than 50%  respiratory variability, suggesting right atrial pressure of 3 mmHg.   Patient Profile     62 y.o. 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 Cardiology was consulted for anticoagulation management.   Assessment & Plan    Supratherapeutic INR and mechanical mitral valve: No further bleeding reported.  INR 1.6 today. -Continue heparin infusion until INR therapeutic. -Warfarin per pharmacy.  Chronic HFrEF: Mild ankle edema noted.  Renal function improved from admission and near baseline. -Consider restarting furosemide 40 mg PO daily tomorrow if renal function remains stable. -Defer restarting metoprolol, Entresto,  and spironolactone at this time in the setting of soft BP requiring midodrine.  Coronary artery disease: No angina reported.  Aspirin on hold in the setting of acute blood loss anemia. -Continue ezetimibe and atorvastatin for secondary prevention.  Stage IV lung cancer: Presumed malignant pleural effusion with hemorrhage in the setting of supratherapeutic INR.  Patient s/p thoracentesis yesterday. -Per IM and heme/onc.  Paroxysmal atrial fibrillation: Patient maintaining sinus rhythm. -Continue heparin/warfarin. -Defer restarting metoprolol with soft BP.  AKI: Thought to be due to dehydration prior to admission.  Creatinine approaching baseline. -Consider restarting furosemide 40 mg PO daily tomorrow based on renal function and volume status (trace edema noted on exam today).  For questions or updates, please contact [facility] Please consult [email or web address] for contact info under [facility] Cardiology.     Signed, Yvonne Kendall, MD  09/14/2022, 5:28 PM

## 2022-09-14 NOTE — Consult Note (Signed)
ANTICOAGULATION CONSULT NOTE  Pharmacy Consult for Heparin Infusion with Bridge to Warfarin Indication: mMVR, AFib  No Known Allergies  Patient Measurements: Height: 5\' 2"  (157.5 cm) Weight: 92.5 kg (203 lb 14.8 oz) IBW/kg (Calculated) : 50.1 Heparin Dosing Weight: 71.6 kg  Vital Signs: Temp: 98 F (36.7 C) (01/16 0350) Temp Source: Oral (01/16 0350) BP: 111/64 (01/16 0350) Pulse Rate: 103 (01/16 0350)  Labs: Recent Labs    09/11/22 1101 09/11/22 1737 09/12/22 0021 09/12/22 0430 09/12/22 0430 09/13/22 0437 09/14/22 0420  HGB  --   --   --  11.7*   < > 10.9* 11.8*  HCT  --   --   --  37.9  --  34.8* 36.9  PLT  --   --   --  175  --  178 152  APTT 36  --   --   --   --   --   --   LABPROT  --   --   --  15.5*  --  17.7* 19.1*  INR  --   --   --  1.2  --  1.5* 1.6*  HEPARINUNFRC  --    < > 0.54  --   --  0.67 0.36  CREATININE  --   --   --  1.47*  --  1.14*  --    < > = values in this interval not displayed.     Estimated Creatinine Clearance: 54.2 mL/min (A) (by C-G formula based on SCr of 1.14 mg/dL (H)).   Medical History: Past Medical History:  Diagnosis Date   AICD (automatic cardioverter/defibrillator) present    Anterior myocardial infarction [facility])    a. 10/2014 - occluded LAD, complicated by cardiogenic shock-->Med Rx as interventional team was unable to open LAD.   Arthritis    arthirits of knee on right   Cancer of upper lobe of right lung (HCC) 09/2017   radiation therapy right side   Cervical cancer (HCC)    in remission for ~20 yrs, s/p hysterectomy   Chronic combined systolic and diastolic CHF (congestive heart failure) (HCC)    a. 10/2015 Echo: EF 20-25%, sev diast dysfxn, mildly reduced RV fxn. Nl MV prosthesis; b. 12/2017 Echo: EF 20-25%, diff HK. Sev ant/antsept HK, apical AK. Mild MS (mean graad 01/2018). Mod TR. PASP .   Coronary artery disease    a. 10/2014 Ant STEMI/Cath: LAD 100p ->Med managed as lesion could not be crossed-->complicated  by CGS and post-MI pericarditis (UV[facility]).   Essential hypertension    Ischemic cardiomyopathy    a. 03/2015 s/p MDT single lead AICD;  b. 10/2015 Echo: EF 20-25%;  c. 12/2017 Echo: EF 20-25%, diff HK.   Mass of upper lobe of right lung    a. noted on CXR & CT 08/2017 w/ abnl PET CT.   Mitral valve disease    a. 2006 s/p MVR w/ 38mm SJM bileaflet mechanical valve-->chronic coumadin/ASA; b. 12/2017 Echo: Mild MS. Mean gradient 01/2018.   Personal history of radiation therapy    f/u lung ca    Medications:  Scheduled:   atorvastatin  80 mg Oral Daily   Chlorhexidine Gluconate Cloth  6 each Topical Daily   ezetimibe  10 mg Oral Daily   feeding supplement  237 mL Oral BID BM   gabapentin  300 mg Oral BID   metoprolol succinate  25 mg Oral Daily   midodrine  10 mg Oral TID WC   multivitamin with minerals  1 tablet Oral Daily   Warfarin - Pharmacist Dosing Inpatient   Does not apply q1600   Infusions:   sodium chloride Stopped (09/11/22 0721)   heparin 1,000 Units/hr (09/14/22 0108)   PRN: acetaminophen **OR** acetaminophen, ondansetron **OR** ondansetron (ZOFRAN) IV, oxyCODONE, senna-docusate, sodium chloride flush  PTA Warfarin: 35 mg/wk 5 mg daily   Assessment: Vanessa Carlson is a 62 y.o. female presenting with hemoptysis with INR >10. 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 Patient was on [facility] PTA per chart review. Last dose of warfarin was 1/11 @ 2200. Patient was supratherapeutic on home regimen. Warfarin was reversed with Kcentra and vitamin K 10 mg on 1/12, and patient was admitted to ICU. Pharmacy has been consulted to initiate and manage heparin infusion.   Baseline Labs: aPTT 36, PT 16.4, INR 1.3, Hgb 12.9, Hct 41.2, Plt 200   Goal of Therapy:  INR 2.5-3.5 Heparin level 0.3-0.7  units/ml Monitor platelets by anticoagulation protocol: Yes   Monitoring Heparin:  Date Time HL Rate/Comment  1/13 1737 0.39 Therapeutic x1 1/14 0021 0.54 Therapeutic x 2 1/15 0437 0.67  Therapeutic x 3 1/16     0420   0.36     Therapeutic X 4   Warfarin: Date    INR      Warfarin Dose    1/13 1.3 5 mg 1/11 @ 2200 then Kcentra + vitK 10 mg 1/12 @ 2000 1/14 1.2 7.5 mg  (held) 1/15 1.5  Plan:  Heparin: heparin was held this am for US guided therapeutic right thoracentesis  Continue heparin infusion at 1000 units/hr (start 4 hours post-procedure based on CHG guidelines) Recheck heparin level once daily w/ AM labs Continue to monitor H&H and platelets daily while on heparin infusion  Warfarin Give warfarin 7.5 mg x1 dose today  Anticipate delay in INR increase over next few days given reversal with Kcentra and vitamin K Continue on parenteral bridge therapy until INR therapeutic  Check INR daily until stable Check CBC at least weekly while on warfarin   Burnis Medin, PharmD, BCPS 09/14/2022 5:18 AM

## 2022-09-15 ENCOUNTER — Encounter: Payer: Self-pay | Admitting: Internal Medicine

## 2022-09-15 DIAGNOSIS — Z952 Presence of prosthetic heart valve: Secondary | ICD-10-CM | POA: Diagnosis not present

## 2022-09-15 DIAGNOSIS — I2510 Atherosclerotic heart disease of native coronary artery without angina pectoris: Secondary | ICD-10-CM

## 2022-09-15 DIAGNOSIS — R791 Abnormal coagulation profile: Secondary | ICD-10-CM | POA: Diagnosis not present

## 2022-09-15 DIAGNOSIS — I255 Ischemic cardiomyopathy: Secondary | ICD-10-CM

## 2022-09-15 LAB — CBC
HCT: 34.4 % — ABNORMAL LOW (ref 36.0–46.0)
Hemoglobin: 11.1 g/dL — ABNORMAL LOW (ref 12.0–15.0)
MCH: 28.9 pg (ref 26.0–34.0)
MCHC: 32.3 g/dL (ref 30.0–36.0)
MCV: 89.6 fL (ref 80.0–100.0)
Platelets: 145 10*3/uL — ABNORMAL LOW (ref 150–400)
RBC: 3.84 MIL/uL — ABNORMAL LOW (ref 3.87–5.11)
RDW: 17.4 % — ABNORMAL HIGH (ref 11.5–15.5)
WBC: 9 10*3/uL (ref 4.0–10.5)
nRBC: 0.2 % (ref 0.0–0.2)

## 2022-09-15 LAB — RENAL FUNCTION PANEL
Albumin: 3.2 g/dL — ABNORMAL LOW (ref 3.5–5.0)
Anion gap: 10 (ref 5–15)
BUN: 14 mg/dL (ref 8–23)
CO2: 25 mmol/L (ref 22–32)
Calcium: 8.1 mg/dL — ABNORMAL LOW (ref 8.9–10.3)
Chloride: 100 mmol/L (ref 98–111)
Creatinine, Ser: 1.48 mg/dL — ABNORMAL HIGH (ref 0.44–1.00)
GFR, Estimated: 40 mL/min — ABNORMAL LOW (ref >60)
Glucose, Bld: 110 mg/dL — ABNORMAL HIGH (ref 70–99)
Phosphorus: 2 mg/dL — ABNORMAL LOW (ref 2.5–4.6)
Potassium: 4 mmol/L (ref 3.5–5.1)
Sodium: 135 mmol/L (ref 135–145)

## 2022-09-15 LAB — PROTIME-INR
INR: 2.2 — ABNORMAL HIGH (ref 0.8–1.2)
Prothrombin Time: 24.5 seconds — ABNORMAL HIGH (ref 11.4–15.2)

## 2022-09-15 LAB — HEPARIN LEVEL (UNFRACTIONATED): Heparin Unfractionated: 0.3 IU/mL (ref 0.30–0.70)

## 2022-09-15 LAB — MAGNESIUM: Magnesium: 2.3 mg/dL (ref 1.7–2.4)

## 2022-09-15 MED ORDER — METOPROLOL SUCCINATE ER 25 MG PO TB24
25.0000 mg | ORAL_TABLET | Freq: Every day | ORAL | Status: DC
  Administered 2022-09-16: 25 mg via ORAL
  Filled 2022-09-15: qty 1

## 2022-09-15 MED ORDER — SODIUM PHOSPHATES 45 MMOLE/15ML IV SOLN
30.0000 mmol | Freq: Once | INTRAVENOUS | Status: AC
  Administered 2022-09-15: 30 mmol via INTRAVENOUS
  Filled 2022-09-15: qty 10

## 2022-09-15 MED ORDER — FUROSEMIDE 40 MG PO TABS
40.0000 mg | ORAL_TABLET | Freq: Every day | ORAL | Status: DC
  Administered 2022-09-15 – 2022-09-16 (×2): 40 mg via ORAL
  Filled 2022-09-15 (×2): qty 1

## 2022-09-15 MED ORDER — WARFARIN SODIUM 2 MG PO TABS
2.0000 mg | ORAL_TABLET | Freq: Once | ORAL | Status: AC
  Administered 2022-09-15: 2 mg via ORAL
  Filled 2022-09-15: qty 1

## 2022-09-15 NOTE — Progress Notes (Signed)
Mobility Specialist - Progress Note   09/15/22 0853  Mobility  Activity Ambulated with assistance in room;Dangled on edge of bed;Stood at bedside  Level of Assistance Standby assist, set-up cues, supervision of patient - no hands on  Assistive Device Front wheel walker  Distance Ambulated (ft) 10 ft  Activity Response Tolerated well  Mobility Referral Yes  $Mobility charge 1 Mobility   Pt semi-supine in bed on RA upon arrival. Pt completes bed mobility, STS, and ambulates from bed to recliner SBA. Pt left in recliner with needs in reach and chair alarm set.   Vanessa Carlson  Mobility Specialist  09/15/22 8:55 AM

## 2022-09-15 NOTE — Progress Notes (Signed)
Rounding Note    Patient Name: Vanessa Carlson Date of Encounter: 09/15/2022  [facility] Cardiologist: Yvonne Kendall, MD   Subjective   No further episodes of hemoptysis.  INR 2.2 today.  Denies chest pain.  Inpatient Medications    Scheduled Meds:  (feeding supplement) PROSource Plus  30 mL Oral BID BM   atorvastatin  80 mg Oral Daily   Chlorhexidine Gluconate Cloth  6 each Topical Daily   ezetimibe  10 mg Oral Daily   feeding supplement  237 mL Oral BID BM   gabapentin  300 mg Oral BID   metoprolol succinate  25 mg Oral Daily   midodrine  10 mg Oral TID WC   multivitamin with minerals  1 tablet Oral Daily   warfarin  2 mg Oral ONCE-1600   Warfarin - Pharmacist Dosing Inpatient   Does not apply q1600   Continuous Infusions:  sodium chloride 250 mL (09/14/22 1144)   heparin 1,000 Units/hr (09/15/22 0427)   sodium phosphate 30 mmol in dextrose 5 % 250 mL infusion 30 mmol (09/15/22 1032)   PRN Meds: acetaminophen **OR** acetaminophen, ondansetron **OR** ondansetron (ZOFRAN) IV, mouth rinse, oxyCODONE, senna-docusate, sodium chloride flush   Vital Signs    Vitals:   09/14/22 2151 09/15/22 0008 09/15/22 0329 09/15/22 0731  BP: (!) 102/57 (!) 105/53 126/66 (!) 100/59  Pulse: (!) 102 98 (!) 106 96  Resp:  20 18 18   Temp:  99.3 F (37.4 C) 98.6 F (37 C) 98.3 F (36.8 C)  TempSrc:  Oral Oral   SpO2: 92% 99% 100% 96%  Weight:      Height:        Intake/Output Summary (Last 24 hours) at 09/15/2022 1044 Last data filed at 09/14/2022 1703 Gross per 24 hour  Intake 353.09 ml  Output --  Net 353.09 ml      09/11/2022    3:32 AM 09/10/2022    1:58 PM 08/20/2022    2:06 PM  Last 3 Weights  Weight (lbs) 203 lb 14.8 oz 199 lb 208 lb  Weight (kg) 92.5 kg 90.266 kg 94.348 kg      Telemetry    Sinus rhythm heart rate 100- Personally Reviewed  ECG     - Personally Reviewed  Physical Exam   GEN: No acute distress.   Neck: No JVD Cardiac: RRR,  mechanical click Respiratory: Diminished breath sounds at bases bilaterally GI: Soft, nontender, non-distended  MS: No edema; No deformity. Neuro:  Nonfocal  Psych: Normal affect   Labs    High Sensitivity Troponin:  No results for input(s): "TROPONINIHS" in the last 720 hours.   Chemistry Recent Labs  Lab 09/10/22 1329 09/10/22 1720 09/10/22 1857 09/13/22 0437 09/13/22 0500 09/14/22 0420 09/14/22 0424 09/15/22 0435  NA 137 137   < > 135  --   --  135 135  K 3.4* 3.5   < > 3.9  --   --  4.2 4.0  CL 97* 98   < > 101  --   --  100 100  CO2 27 27   < > 29  --   --  27 25  GLUCOSE 132* 119*   < > 98  --   --  96 110*  BUN 20 20   < > 16  --   --  15 14  CREATININE 1.81* 1.97*   < > 1.14*  --   --  1.24* 1.48*  CALCIUM 9.1 9.3   < >  8.5*  --   --  8.7* 8.1*  MG  --   --    < >  --  2.4 2.3  --  2.3  PROT 8.1 8.5*  --   --   --   --   --   --   ALBUMIN 4.0 4.2  --  3.3*  --   --  3.4* 3.2*  AST 31 33  --   --   --   --   --   --   ALT 17 17  --   --   --   --   --   --   ALKPHOS 193* 203*  --   --   --   --   --   --   BILITOT 1.8* 2.3*  --   --   --   --   --   --   GFRNONAA 31* 28*   < > 54*  --   --  49* 40*  ANIONGAP 13 12   < > 5  --   --  8 10   < > = values in this interval not displayed.    Lipids No results for input(s): "CHOL", "TRIG", "HDL", "LABVLDL", "LDLCALC", "CHOLHDL" in the last 168 hours.  Hematology Recent Labs  Lab 09/13/22 0437 09/14/22 0420 09/15/22 0435  WBC 9.3 9.4 9.0  RBC 3.82* 4.13 3.84*  HGB 10.9* 11.8* 11.1*  HCT 34.8* 36.9 34.4*  MCV 91.1 89.3 89.6  MCH 28.5 28.6 28.9  MCHC 31.3 32.0 32.3  RDW 17.2* 17.2* 17.4*  PLT 178 152 145*   Thyroid No results for input(s): "TSH", "FREET4" in the last 168 hours.  BNP Recent Labs  Lab 09/11/22 0730  BNP 67.2    DDimer No results for input(s): "DDIMER" in the last 168 hours.   Radiology    US THORACENTESIS ASP PLEURAL SPACE W/IMG GUIDE  Result Date: 09/13/2022 INDICATION: Patient with  history of non-small cell lung cancer with recurrent right pleural effusion. Request received for therapeutic thoracentesis. EXAM: ULTRASOUND GUIDED THERAPEUTIC RIGHT THORACENTESIS MEDICATIONS: 10 mL 1 % lidocaine COMPLICATIONS: None immediate. PROCEDURE: An ultrasound guided thoracentesis was thoroughly discussed with the patient and questions answered. The benefits, risks, alternatives and complications were also discussed. The patient understands and wishes to proceed with the procedure. Written consent was obtained. Ultrasound was performed to localize and mark an adequate pocket of fluid in the right chest. The area was then prepped and draped in the normal sterile fashion. 1% Lidocaine was used for local anesthesia. Under ultrasound guidance a 6 Fr Safe-T-Centesis catheter was introduced. Thoracentesis was performed. The catheter was removed and a dressing applied. FINDINGS: A total of approximately 1.3 L of dark, bloody fluid was removed. IMPRESSION: Successful ultrasound guided right thoracentesis yielding 1.3 L of pleural fluid. Read by: Alex Gardener, AGNP-BC Electronically Signed   By: Acquanetta Belling M.D.   On: 09/13/2022 14:39   DG Chest Port 1 View  Result Date: 09/13/2022 CLINICAL DATA:  Post thora EXAM: PORTABLE CHEST - 1 VIEW COMPARISON:  CT 09/10/2022 FINDINGS: No pneumothorax. Persistent moderate right pleural effusion, with atelectasis/consolidation at the right lung base. Left lung clear. Stable right IJ port catheter to the distal SVC. Stable left subclavian AICD. Heart size and mediastinal contours are within normal limits. Post MVR. Sternotomy wires. IMPRESSION: Persistent moderate right pleural effusion. No pneumothorax post thoracentesis. Electronically Signed   By: Corlis Leak M.D.   On: 09/13/2022 14:11  Cardiac Studies   TTEcho EF 20 to 25%  Patient Profile     62 y.o. 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  Assessment & Plan    Mechanical mitral valve, hemoptysis, supratherapeutic INR -S/p reversal, INR trending upwards with heparin and warfarin. -INR today 2.2 -Continue warfarin per pharmacy protocol, likely stop heparin bridge today or tomorrow as per pharmacy.  2.  Ischemic cardiomyopathy -BP better -Restart PTA Lasix 40 mg daily, continue Toprol-XL. -Restart other GDMT (  Entresto, Aldactone ) when BP permits -Patient is requiring midodrine for adequate BP.  3.  Paroxysmal A-fib -Sinus rhythm, heart rate, controlled  -Toprol-XL, warfarin  4.  CAD -Denies chest pain -Warfarin, Lipitor  Total encounter time more than 50 minutes  Greater than 50% was spent in counseling and coordination of care with the patient   Signed, Debbe Odea, MD  09/15/2022, 10:44 AM

## 2022-09-15 NOTE — Progress Notes (Signed)
Patient mobilized from bed to toilet and toilet to bed with assistance.

## 2022-09-15 NOTE — Consult Note (Signed)
ANTICOAGULATION CONSULT NOTE  Pharmacy Consult for Heparin Infusion with Bridge to Warfarin Indication: mMVR, AFib  No Known Allergies  Patient Measurements: Height: 5\' 2"  (157.5 cm) Weight: 92.5 kg (203 lb 14.8 oz) IBW/kg (Calculated) : 50.1 Heparin Dosing Weight: 71.6 kg  Vital Signs: Temp: 98.6 F (37 C) (01/17 0329) Temp Source: Oral (01/17 0329) BP: 126/66 (01/17 0329) Pulse Rate: 106 (01/17 0329)  Labs: Recent Labs    09/13/22 0437 09/14/22 0420 09/14/22 0424 09/15/22 0435  HGB 10.9* 11.8*  --  11.1*  HCT 34.8* 36.9  --  34.4*  PLT 178 152  --  145*  LABPROT 17.7* 19.1*  --   --   INR 1.5* 1.6*  --   --   HEPARINUNFRC 0.67 0.36  --  0.30  CREATININE 1.14*  --  1.24* 1.48*     Estimated Creatinine Clearance: 41.7 mL/min (A) (by C-G formula based on SCr of 1.48 mg/dL (H)).   Medical History: Past Medical History:  Diagnosis Date   AICD (automatic cardioverter/defibrillator) present    Anterior myocardial infarction [facility])    a. 10/2014 - occluded LAD, complicated by cardiogenic shock-->Med Rx as interventional team was unable to open LAD.   Arthritis    arthirits of knee on right   Cancer of upper lobe of right lung (HCC) 09/2017   radiation therapy right side   Cervical cancer (HCC)    in remission for ~20 yrs, s/p hysterectomy   Chronic combined systolic and diastolic CHF (congestive heart failure) (HCC)    a. 10/2015 Echo: EF 20-25%, sev diast dysfxn, mildly reduced RV fxn. Nl MV prosthesis; b. 12/2017 Echo: EF 20-25%, diff HK. Sev ant/antsept HK, apical AK. Mild MS (mean graad 01/2018). Mod TR. PASP .   Coronary artery disease    a. 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   Essential hypertension    Ischemic cardiomyopathy    a. 03/2015 s/p MDT single lead AICD;  b. 10/2015 Echo: EF 20-25%;  c. 12/2017 Echo: EF 20-25%, diff HK.   Mass of upper lobe of right lung     a. noted on CXR & CT 08/2017 w/ abnl PET CT.   Mitral valve disease    a. 2006 s/p MVR w/ 64mm SJM bileaflet mechanical valve-->chronic coumadin/ASA; b. 12/2017 Echo: Mild MS. Mean gradient 01/2018.   Personal history of radiation therapy    f/u lung ca    Medications:  Scheduled:   (feeding supplement) PROSource Plus  30 mL Oral BID BM   atorvastatin  80 mg Oral Daily   Chlorhexidine Gluconate Cloth  6 each Topical Daily   ezetimibe  10 mg Oral Daily   feeding supplement  237 mL Oral BID BM   gabapentin  300 mg Oral BID   metoprolol succinate  25 mg Oral Daily   midodrine  10 mg Oral TID WC   multivitamin with minerals  1 tablet Oral Daily   Warfarin - Pharmacist Dosing Inpatient   Does not apply q1600   Infusions:   sodium chloride 250 mL (09/14/22 1144)   heparin 1,000 Units/hr (09/15/22 0427)   PRN: acetaminophen **OR** acetaminophen, ondansetron **OR** ondansetron (ZOFRAN) IV, mouth rinse, oxyCODONE, senna-docusate, sodium chloride flush  PTA Warfarin: 35 mg/wk 5 mg daily   Assessment: Vanessa Carlson is a 62 y.o. female presenting with hemoptysis with INR >10. 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 Patient was on [facility] PTA per chart review. Last dose of warfarin was 1/11 @ 2200. Patient was supratherapeutic on home regimen. Warfarin was reversed with Kcentra and vitamin K 10 mg on 1/12, and patient was admitted to ICU. Pharmacy has been consulted to initiate and manage heparin infusion.   Baseline Labs: aPTT 36, PT 16.4, INR 1.3, Hgb 12.9, Hct 41.2, Plt 200   Goal of Therapy:  INR 2.5-3.5 Heparin level 0.3-0.7 units/ml Monitor platelets by anticoagulation protocol: Yes   Monitoring Heparin:  Date Time HL Rate/Comment  1/13 1737 0.39 Therapeutic x1 1/14 0021 0.54 Therapeutic x  2 1/15 0437 0.67  Therapeutic x 3 1/16     0420   0.36     Therapeutic X 4 1/17     0435    0.30    Therapeutic X 5    Warfarin: Date    INR      Warfarin Dose    1/13 1.3 5 mg 1/11 @ 2200 then Kcentra + vitK 10 mg 1/12 @ 2000 1/14 1.2 7.5 mg  (held) 1/15 1.5  Plan:  Heparin: heparin was held this am for US guided therapeutic right thoracentesis  Continue heparin infusion at 1000 units/hr (start 4 hours post-procedure based on CHG guidelines) Recheck heparin level once daily w/ AM labs Continue to monitor H&H and platelets daily while on heparin infusion  Warfarin Give warfarin 7.5 mg x1 dose today  Anticipate delay in INR increase over next few days given reversal with Kcentra and vitamin K Continue on parenteral bridge therapy until INR therapeutic  Check INR daily until stable Check CBC at least weekly while on warfarin   Vanessa Carlson D 09/15/2022 6:00 AM

## 2022-09-15 NOTE — Progress Notes (Signed)
PHARMACY CONSULT NOTE - ELECTROLYTES  Pharmacy Consult for Electrolyte Monitoring and Replacement   Recent Labs: Potassium (mmol/L)  Date Value  09/15/2022 4.0   Magnesium (mg/dL)  Date Value  55/77/7017 2.3   Calcium (mg/dL)  Date Value  97/07/9088 8.1 (L)   Albumin (g/dL)  Date Value  64/84/7422 3.2 (L)  08/18/2018 4.2   Phosphorus (mg/dL)  Date Value  94/63/9202 2.0 (L)   Sodium (mmol/L)  Date Value  09/15/2022 135  12/19/2019 145 (H)   Corrected Ca: 9.2 mg/dL  Assessment  Vanessa Carlson is a 62 y.o. female presenting with hemoptysis with INR >10. 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 Pharmacy has been consulted to monitor and replace phosphorous.  Diet: Regular  Goal of Therapy: Electrolytes WNL  Plan: Phos 2 today 1/17 30 mmol IV sodium phosphate x 1 Recheck electrolytes in am  Thank you for allowing pharmacy to be a part of this patient's care.  Terrianne Cavness Rodriguez-Guzman PharmD, BCPS 09/15/2022 7:51 AM

## 2022-09-15 NOTE — Progress Notes (Signed)
ANTICOAGULATION CONSULT NOTE  Pharmacy Consult for Heparin Infusion with Bridge to Warfarin Indication: mMVR, AFib  No Known Allergies  Patient Measurements: Height: 5\' 2"  (157.5 cm) Weight: 92.5 kg (203 lb 14.8 oz) IBW/kg (Calculated) : 50.1 Heparin Dosing Weight: 71.6 kg  Vital Signs: Temp: 98.3 F (36.8 C) (01/17 0731) Temp Source: Oral (01/17 0329) BP: 100/59 (01/17 0731) Pulse Rate: 96 (01/17 0731)  Labs: Recent Labs    09/13/22 0437 09/14/22 0420 09/14/22 0424 09/15/22 0435  HGB 10.9* 11.8*  --  11.1*  HCT 34.8* 36.9  --  34.4*  PLT 178 152  --  145*  LABPROT 17.7* 19.1*  --   --   INR 1.5* 1.6*  --   --   HEPARINUNFRC 0.67 0.36  --  0.30  CREATININE 1.14*  --  1.24* 1.48*     Estimated Creatinine Clearance: 41.7 mL/min (A) (by C-G formula based on SCr of 1.48 mg/dL (H)).   Medical History: Past Medical History:  Diagnosis Date   AICD (automatic cardioverter/defibrillator) present    Anterior myocardial infarction [facility])    a. 10/2014 - occluded LAD, complicated by cardiogenic shock-->Med Rx as interventional team was unable to open LAD.   Arthritis    arthirits of knee on right   Cancer of upper lobe of right lung (HCC) 09/2017   radiation therapy right side   Cervical cancer (HCC)    in remission for ~20 yrs, s/p hysterectomy   Chronic combined systolic and diastolic CHF (congestive heart failure) (HCC)    a. 10/2015 Echo: EF 20-25%, sev diast dysfxn, mildly reduced RV fxn. Nl MV prosthesis; b. 12/2017 Echo: EF 20-25%, diff HK. Sev ant/antsept HK, apical AK. Mild MS (mean graad 01/2018). Mod TR. PASP .   Coronary artery disease    a. 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   Essential hypertension    Ischemic cardiomyopathy    a. 03/2015 s/p MDT single lead AICD;  b. 10/2015 Echo: EF 20-25%;  c. 12/2017 Echo: EF 20-25%, diff HK.   Mass of upper lobe of right lung     a. noted on CXR & CT 08/2017 w/ abnl PET CT.   Mitral valve disease    a. 2006 s/p MVR w/ 69mm SJM bileaflet mechanical valve-->chronic coumadin/ASA; b. 12/2017 Echo: Mild MS. Mean gradient 01/2018.   Personal history of radiation therapy    f/u lung ca    Medications:  Scheduled:   (feeding supplement) PROSource Plus  30 mL Oral BID BM   atorvastatin  80 mg Oral Daily   Chlorhexidine Gluconate Cloth  6 each Topical Daily   ezetimibe  10 mg Oral Daily   feeding supplement  237 mL Oral BID BM   gabapentin  300 mg Oral BID   metoprolol succinate  25 mg Oral Daily   midodrine  10 mg Oral TID WC   multivitamin with minerals  1 tablet Oral Daily   Warfarin - Pharmacist Dosing Inpatient   Does not apply q1600   Infusions:   sodium chloride 250 mL (09/14/22 1144)   heparin 1,000 Units/hr (09/15/22 0427)   PRN: acetaminophen **OR** acetaminophen, ondansetron **OR** ondansetron (ZOFRAN) IV, mouth rinse, oxyCODONE, senna-docusate, sodium chloride flush  PTA Warfarin: 35 mg/wk 5 mg daily   Assessment: Vanessa Carlson is a 62 y.o. female presenting with hemoptysis with INR >10. 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 Patient was on [facility] PTA per chart review.  Patient's INR was supratherapeutic on home regimen. Warfarin was reversed with Kcentra and vitamin K 10 mg on 1/12, and patient was admitted to ICU. Pharmacy has been consulted to initiate and manage heparin infusion and warfarin.   Baseline Labs: aPTT 36, PT 16.4, INR 1.3, Hgb 12.9, Hct 41.2, Plt 200   Goal of Therapy:  INR 2.5-3.5 Heparin level 0.3-0.7 units/ml Monitor platelets by anticoagulation protocol: Yes   Monitoring Heparin:  Date Time HL Rate/Comment  1/13 1737 0.39 Therapeutic x1 1/14 0021 0.54 Therapeutic x 2 1/15 0437 0.67   Therapeutic x 3 1/15 0420 0.36 Therapeutic x 4 1/16 0435 0.30 Therapeutic x 5  Warfarin: Date    INR      Warfarin Dose    1/13 1.3 5 mg 1/11 @ 2200 then Kcentra + vitK 10 mg 1/12 @ 2000 1/14 1.2 --- 1/15 1.5 7.5mg  1/16 1.6 5mg  1/17 2.2 2mg    Plan:  Heparin Continue heparin infusion at 1000 units/hr  Recheck heparin level once daily w/ AM labs Continue to monitor H&H and platelets daily while on heparin infusion  Warfarin Warfarin 2mg  x 1 dose today Continue on parenteral bridge therapy until INR therapeutic  Check INR daily until stable Check CBC at least every 3 days while inpatient  Yanitza Shvartsman Rodriguez-Guzman PharmD, BCPS 09/15/2022 7:49 AM

## 2022-09-15 NOTE — Progress Notes (Addendum)
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  In ED, INR >10, hemodynamically stable and not hypoxic.  CT C/A/P without contrast showing progressive RUL mass/lung cancer, large multiloculated right pleural effusion with compressive atelectasis, progressive metastatic lung disease and possible liver mets.  She was given a dose of Kcentra and vitamin K 10 mg on 1/12 and admitted to ICU.  PCCM was consulted.  No further hemoptysis.  INR down to 1.3.  Initiating IV heparin with close monitoring due to mechanical MVR.  Cardiology consulted.  IR consulted and s/p ultrasound-guided right thoracentesis yielding 1.3 L of dark bloody fluid, suspected malignant pleural effusion.   Assessment and Plan: Hemoptysis/supratherapeutic INR: Secondary to supratherapeutic INR in the context of progressive stage IV lung cancer.  Elevated INR felt to be due to poor oral intake.  S/p Kcentra and IV vitamin K 10 mg on 1/12.  INR down from >10 to 1.3.  Reports only a single episode of handful of hemoptysis on 1/11 and no recurrence since hospital admission.  She is on anticoagulation for mechanical MVR and A-fib.  She remains at high risk for recurrent hemoptysis from her underlying lung cancer.  At the same time, also at high risk for clotting of her mechanical valve.  This was discussed in detail with  patient, risks and benefits of anticoagulation including catastrophic bleeding risks and even death versus no anticoagulation and severe thromboembolic complications.  She is on IV heparin bridging and warfarin, managed by pharmacy.  INR up to 2.2, heparin iv discontinued hemoglobin stable.  Based on chart review, it appears that palliative care medicine is already on board as outpatient.     Acute kidney injury: Serum creatinine 0.94 on 12/1.  Since then has progressively increased, 1.81 on admission and peaked to 2.16.  No recent contrast for CT.  May be related to hypotension/ATN.  Avoid nephrotoxic's.  Resolved.  Creatinine down to 1.14 >1.24>1.48   Hypokalemia:  Replaced.  Magnesium 2.3.   Hypophosphatemia Phosphorus 1.9 >2.1 >1.7 >1.5.  May need to replace aggressively IV, pharmacy was consulted for same.   Hypotension: Unclear etiology.?  Poor oral intake and meds, now on hold (Entresto, and spironolactone).  S/p 1 L IV fluid bolus.  Midodrine 10 Mg 3 times daily has been initiated.  Blood pressures mostly SBP in the 100s-110s.  Cardiology has initiated Toprol-XL 25 Mg daily.   Large multiloculated right pleural effusion: Last had thoracentesis on 12/28.  S/p 1.3 L of ultrasound-guided thoracentesis 1/15, dark bloody fluid, suspect malignant pleural effusion.  As per discussion with pulmonology on 09/14/2022 likely malignant pleural effusion, no lab work was thereby sent, may need Pleurx catheter if reaccumulate's quickly.  Appears that palliative care is following as outpatient.   Hyperlipidemia: Continue atorvastatin and Zetia   Paroxysmal atrial fibrillation In sinus rhythm.  Anticoagulation discussion as above.  High risk anticoagulation candidate.  Cardiology has restarted Toprol-XL at 25 Mg daily.  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  Now that she has completed thoracentesis, may continue IV heparin bridge with warfarin per pharmacy.   Essential  hypertension: Hypotensive on admission.  Antihypertensives and diuretics were held.  Now on Toprol-XL 25 Mg daily.  SBPs in the 100s-110s.   Chronic systolic CHF/cardiomyopathy: Clinically euvolemic.  TTE : LVEF 20-25%, unchanged from prior.  Lasix on hold.   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  Cardiology consulted to opine regarding anticoagulation.  Anticoagulation discussion as noted above.  Goal INR 2.5-3.5.  DC home pending INR therapeutic in that range.  Do not think Lovenox bridge is indicated for mechanical valves.   Stage IV adenocarcinoma of lung with malignant right pleural effusion, pulmonary metastasis and possible liver mets. Outpatient follow-up with [name], oncology and palliative care medicine   Chronic low back pain Controlled on oxycodone.  Discussed with RN regarding mobilizing/out of bed to chair.   Isolated hyperbilirubinemia: Unclear etiology.?  Liver mets related.  Follow LFT's periodically.   Body mass index is 37.3 kg/m./Obesity         Subjective: Seen patient at the bedside denies hemoptysis.  She denies palpitations dizziness or lightheadedness or chest pain. Seen by cardiology who has recommended discontinue IV heparin and continue Coumadin to maintain therapeutic INR.  Physical Exam: Vitals:   09/15/22 0008 09/15/22 0329 09/15/22 0731 09/15/22 1128  BP: (!) 105/53 126/66 (!) 100/59 98/60  Pulse: 98 (!) 106 96 87  Resp: 20 18 18 18   Temp: 99.3 F (37.4 C) 98.6 F (37 C) 98.3 F (36.8 C) 98.3 F (36.8 C)  TempSrc: Oral Oral  Oral  SpO2: 99% 100% 96% 95%  Weight:      Height:       General: Appearance:    Obese female in no acute distress  Eyes:    PERRL, conjunctiva/corneas clear, EOM's intact       Lungs:     Clear to auscultation bilaterally, respirations unlabored  Heart:    Normal heart rate. Irregularly irregular rhythm. No murmurs, rubs, or gallops.  A metallic  click heard  MS:   All extremities are intact.   Neurologic:   Awake, alert, oriented x 3. No apparent focal neurological           defect.      Data Reviewed:  Hemoglobin 11.1 Creatinine 1.48 Family Communication: Discussed the plan of care with patient at the bedside Consultants:   Pulmonology Cardiology Interventional radiology Disposition: Status is: Inpatient Remains inpatient appropriate because: On IV heparin drip  Planned Discharge Destination: Home   DVT prophylaxis: Place TED hose Start: 09/10/22 1856     Code Status: Full Code:  Family Communication: None at bedside this morning. Disposition:  Time spent: 31 minutes  Author: 11/09/22, MD 09/15/2022 4:43 PM  For on call review [email or web address].

## 2022-09-16 DIAGNOSIS — I25118 Atherosclerotic heart disease of native coronary artery with other forms of angina pectoris: Secondary | ICD-10-CM

## 2022-09-16 DIAGNOSIS — I480 Paroxysmal atrial fibrillation: Secondary | ICD-10-CM | POA: Diagnosis not present

## 2022-09-16 DIAGNOSIS — R042 Hemoptysis: Secondary | ICD-10-CM

## 2022-09-16 DIAGNOSIS — I420 Dilated cardiomyopathy: Secondary | ICD-10-CM

## 2022-09-16 LAB — CULTURE, BLOOD (ROUTINE X 2)
Culture: NO GROWTH
Culture: NO GROWTH
Special Requests: ADEQUATE

## 2022-09-16 LAB — HEPARIN LEVEL (UNFRACTIONATED): Heparin Unfractionated: 0.4 IU/mL (ref 0.30–0.70)

## 2022-09-16 LAB — PROTIME-INR
INR: 2.7 — ABNORMAL HIGH (ref 0.8–1.2)
Prothrombin Time: 28.6 seconds — ABNORMAL HIGH (ref 11.4–15.2)

## 2022-09-16 MED ORDER — WARFARIN SODIUM 2 MG PO TABS
2.0000 mg | ORAL_TABLET | Freq: Every day | ORAL | Status: DC
  Filled 2022-09-16: qty 1

## 2022-09-16 MED ORDER — POTASSIUM CHLORIDE CRYS ER 20 MEQ PO TBCR: 40.0000 meq | EXTENDED_RELEASE_TABLET | Freq: Every day | ORAL | 6 refills | Status: DC

## 2022-09-16 MED ORDER — WARFARIN SODIUM 2 MG PO TABS: 2.0000 mg | ORAL_TABLET | Freq: Every day | ORAL | 0 refills | Status: DC

## 2022-09-16 MED ORDER — MIDODRINE HCL 10 MG PO TABS: 10.0000 mg | ORAL_TABLET | Freq: Three times a day (TID) | ORAL | 0 refills | Status: DC

## 2022-09-16 NOTE — Progress Notes (Signed)
Progress Note  Patient Name: Vanessa Carlson Date of Encounter: 09/16/2022  Primary Cardiologist: End Primary Electrophysiologist: Graciela Husbands  Subjective   No chest pain, dyspnea, palpitations, or dizziness. Has lost a significant amount of weight leading up to her admission. INR 2.7.   Inpatient Medications    Scheduled Meds:  (feeding supplement) PROSource Plus  30 mL Oral BID BM   atorvastatin  80 mg Oral Daily   Chlorhexidine Gluconate Cloth  6 each Topical Daily   ezetimibe  10 mg Oral Daily   feeding supplement  237 mL Oral BID BM   furosemide  40 mg Oral Daily   gabapentin  300 mg Oral BID   metoprolol succinate  25 mg Oral Daily   midodrine  10 mg Oral TID WC   multivitamin with minerals  1 tablet Oral Daily   warfarin  2 mg Oral q1600   Warfarin - Pharmacist Dosing Inpatient   Does not apply q1600   Continuous Infusions:  sodium chloride 250 mL (09/14/22 1144)   PRN Meds: mouth rinse, oxyCODONE, senna-docusate, sodium chloride flush   Vital Signs    Vitals:   09/15/22 1945 09/15/22 2343 09/16/22 0353 09/16/22 0754  BP: (!) 101/55 (!) 99/59 100/60 110/63  Pulse: 91 96 93 84  Resp: 18 18 18 19   Temp: 98.2 F (36.8 C) 98.8 F (37.1 C) 98.5 F (36.9 C) 98.3 F (36.8 C)  TempSrc: Oral Oral  Oral  SpO2: 98% 95% 93% 94%  Weight:      Height:        Intake/Output Summary (Last 24 hours) at 09/16/2022 1114 Last data filed at 09/16/2022 1001 Gross per 24 hour  Intake 1183.2 ml  Output --  Net 1183.2 ml   Filed Weights   09/11/22 0332  Weight: 92.5 kg    Telemetry    SR - Personally Reviewed  ECG    No new tracings - Personally Reviewed  Physical Exam   GEN: No acute distress.   Neck: No JVD. Cardiac: RRR, mechanical click, no rubs, or gallops.  Respiratory: Clear to auscultation bilaterally.  GI: Soft, nontender, non-distended.   MS: No edema; No deformity. Neuro:  Alert and oriented x 3; Nonfocal.  Psych: Normal affect.  Labs     Chemistry Recent Labs  Lab 09/10/22 1329 09/10/22 1720 09/11/22 0444 09/13/22 0437 09/14/22 0424 09/15/22 0435  NA 137 137   < > 135 135 135  K 3.4* 3.5   < > 3.9 4.2 4.0  CL 97* 98   < > 101 100 100  CO2 27 27   < > 29 27 25   GLUCOSE 132* 119*   < > 98 96 110*  BUN 20 20   < > 16 15 14   CREATININE 1.81* 1.97*   < > 1.14* 1.24* 1.48*  CALCIUM 9.1 9.3   < > 8.5* 8.7* 8.1*  PROT 8.1 8.5*  --   --   --   --   ALBUMIN 4.0 4.2  --  3.3* 3.4* 3.2*  AST 31 33  --   --   --   --   ALT 17 17  --   --   --   --   ALKPHOS 193* 203*  --   --   --   --   BILITOT 1.8* 2.3*  --   --   --   --   GFRNONAA 31* 28*   < > 54* 49* 40*  ANIONGAP  13 12   < > 5 8 10    < > = values in this interval not displayed.     Hematology Recent Labs  Lab 09/13/22 0437 09/14/22 0420 09/15/22 0435  WBC 9.3 9.4 9.0  RBC 3.82* 4.13 3.84*  HGB 10.9* 11.8* 11.1*  HCT 34.8* 36.9 34.4*  MCV 91.1 89.3 89.6  MCH 28.5 28.6 28.9  MCHC 31.3 32.0 32.3  RDW 17.2* 17.2* 17.4*  PLT 178 152 145*    Cardiac EnzymesNo results for input(s): "TROPONINI" in the last 168 hours. No results for input(s): "TROPIPOC" in the last 168 hours.   BNP Recent Labs  Lab 09/11/22 0730  BNP 67.2     DDimer No results for input(s): "DDIMER" in the last 168 hours.   Radiology    No results found.  Cardiac Studies   2D echo 09/11/2022: 1. Left ventricular ejection fraction, by estimation, is 20 to 25%. The  left ventricle has severely decreased function. The left ventricle  demonstrates regional wall motion abnormalities (see scoring  diagram/findings for description). Left ventricular  diastolic function could not be evaluated.   2. Right ventricular systolic function is normal. The right ventricular  size is normal. There is normal pulmonary artery systolic pressure. The  estimated right ventricular systolic pressure is 31.7 mmHg.   3. The mitral valve has been repaired/replaced. No evidence of mitral  valve  regurgitation. The mean mitral valve gradient is 2.0 mmHg with  average heart rate of 85 bpm. There is a 27 mm mechanical valve present in  the mitral position. Echo findings are  consistent with normal structure and function of the mitral valve  prosthesis.   4. The aortic valve is tricuspid. Aortic valve regurgitation is not  visualized. No aortic stenosis is present.   5. The inferior vena cava is normal in size with greater than 50%  respiratory variability, suggesting right atrial pressure of 3 mmHg.   Comparison(s): No significant change from prior study.  __________  2D echo 01/28/2022: 1. Left ventricular ejection fraction, by estimation, is 25 to 30%. The  left ventricle has severely decreased function. The left ventricle  demonstrates global hypokinesis. The left ventricular internal cavity size  was mildly dilated. Left ventricular  diastolic parameters are indeterminate. The average left ventricular  global longitudinal strain is -9.0 %. The global longitudinal strain is  abnormal.   2. Right ventricular systolic function is mildly reduced. The right  ventricular size is normal.   3. Left atrial size was mild to moderately dilated.   4. The mitral valve has been repaired/replaced. No evidence of mitral  valve regurgitation. There is a mechanical valve present in the mitral  position.   5. The aortic valve was not well visualized. Aortic valve regurgitation  is not visualized.   6. The inferior vena cava is normal in size with greater than 50%  respiratory variability, suggesting right atrial pressure of 3 mmHg.   Comparison(s): 12/2017-EF 20%-25%; MVR;.   Patient Profile     62 y.o. 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   Assessment & Plan    Supratherapeutic INR and mechanical mitral valve: -No further bleeding reported.   -INR 2.7 today. -Warfarin per pharmacy.   Chronic HFrEF: -Euvolemic. -Now  on lower dose furosemide 40 mg PO daily and Toprol XL. -Defer restarting Entresto, and spironolactone at this time in the setting of soft BP requiring midodrine and AKI.   Coronary artery disease: -No angina reported.   -Aspirin on hold in the setting of acute blood loss anemia. -Continue ezetimibe and atorvastatin for secondary prevention.   Stage IV lung cancer: -Presumed malignant pleural effusion with hemorrhage in the setting of supratherapeutic INR.   -Patient s/p thoracentesis this admission. -Per IM and heme/onc.   Paroxysmal atrial fibrillation: -Patient maintaining sinus rhythm. -Continue warfarin and metoprolol.    AKI: -Thought to be due to dehydration prior to admission.   -Now on lower dose furosemide 40 mg PO daily.      For questions or updates, please contact [facility] Please consult [email or web address] for contact info under Cardiology/STEMI.    Signed, Eula Listen, PA-C [facility] Pager: [phone] 09/16/2022, 11:14 AM

## 2022-09-16 NOTE — Progress Notes (Signed)
ANTICOAGULATION CONSULT NOTE  Pharmacy Consult for Heparin Infusion with Bridge to Warfarin Indication: mMVR, AFib  No Known Allergies  Patient Measurements: Height: 5\' 2"  (157.5 cm) Weight: 92.5 kg (203 lb 14.8 oz) IBW/kg (Calculated) : 50.1 Heparin Dosing Weight: 71.6 kg  Vital Signs: Temp: 97.9 F (36.6 C) (01/18 0754) Temp Source: Oral (01/17 2343) BP: 110/63 (01/18 0754) Pulse Rate: 84 (01/18 0754)  Labs: Recent Labs    09/14/22 0420 09/14/22 0424 09/15/22 0435 09/15/22 0500 09/16/22 0550  HGB 11.8*  --  11.1*  --   --   HCT 36.9  --  34.4*  --   --   PLT 152  --  145*  --   --   LABPROT 19.1*  --   --  24.5* 28.6*  INR 1.6*  --   --  2.2* 2.7*  HEPARINUNFRC 0.36  --  0.30  --  0.40  CREATININE  --  1.24* 1.48*  --   --      Estimated Creatinine Clearance: 41.7 mL/min (A) (by C-G formula based on SCr of 1.48 mg/dL (H)).   Medical History: Past Medical History:  Diagnosis Date   AICD (automatic cardioverter/defibrillator) present    Anterior myocardial infarction [facility])    a. 10/2014 - occluded LAD, complicated by cardiogenic shock-->Med Rx as interventional team was unable to open LAD.   Arthritis    arthirits of knee on right   Cancer of upper lobe of right lung (HCC) 09/2017   radiation therapy right side   Cervical cancer (HCC)    in remission for ~20 yrs, s/p hysterectomy   Chronic combined systolic and diastolic CHF (congestive heart failure) (HCC)    a. 10/2015 Echo: EF 20-25%, sev diast dysfxn, mildly reduced RV fxn. Nl MV prosthesis; b. 12/2017 Echo: EF 20-25%, diff HK. Sev ant/antsept HK, apical AK. Mild MS (mean graad 01/2018). Mod TR. PASP .   Coronary artery disease    a. 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   Essential hypertension    Ischemic cardiomyopathy    a. 03/2015 s/p MDT single lead AICD;  b. 10/2015 Echo: EF 20-25%;  c. 12/2017 Echo: EF  20-25%, diff HK.   Mass of upper lobe of right lung    a. noted on CXR & CT 08/2017 w/ abnl PET CT.   Mitral valve disease    a. 2006 s/p MVR w/ 27mm SJM bileaflet mechanical valve-->chronic coumadin/ASA; b. 12/2017 Echo: Mild MS. Mean gradient 01/2018.   Personal history of radiation therapy    f/u lung ca    Medications:  Scheduled:   (feeding supplement) PROSource Plus  30 mL Oral BID BM   atorvastatin  80 mg Oral Daily   Chlorhexidine Gluconate Cloth  6 each Topical Daily   ezetimibe  10 mg Oral Daily   feeding supplement  237 mL Oral BID BM   furosemide  40 mg Oral Daily   gabapentin  300 mg Oral BID   metoprolol succinate  25 mg Oral Daily   midodrine  10 mg Oral TID WC   multivitamin with minerals  1 tablet Oral Daily   Warfarin - Pharmacist Dosing Inpatient   Does not apply q1600   Infusions:   sodium chloride 250 mL (09/14/22 1144)   PRN: mouth rinse, oxyCODONE, senna-docusate, sodium chloride flush  PTA Warfarin: 35 mg/wk 5 mg daily   Assessment: Vanessa Carlson is  a 62 y.o. female presenting with hemoptysis with INR >10. 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 Patient was on [facility] PTA per chart review.  Patient's INR was supratherapeutic on home regimen. Warfarin was reversed with Kcentra and vitamin K 10 mg on 1/12, and patient was admitted to ICU. Pharmacy has been consulted to initiate and manage heparin infusion and warfarin.   Baseline Labs: aPTT 36, PT 16.4, INR 1.3, Hgb 12.9, Hct 41.2, Plt 200   Goal of Therapy:  INR 2.5-3.5 Heparin level 0.3-0.7 units/ml Monitor platelets by anticoagulation protocol: Yes   Monitoring Heparin:  Date Time HL Rate/Comment  1/13 1737 0.39 Therapeutic x1 1/14 0021 0.54 Therapeutic x 2 1/15 0437 0.67  Therapeutic x  3 1/15 0420 0.36 Therapeutic x 4 1/16 0435 0.30 Therapeutic x 5 1/17 0550 0.40 Therapeutic x 6 - will dc not that INR therapeutic  Warfarin: Date    INR      Warfarin Dose    1/13 1.3 5 mg 1/11 @ 2200 then Kcentra + vitK 10 mg 1/12 @ 2000 1/14 1.2 --- 1/15 1.5 7.5mg  1/16 1.6 5mg  1/17 2.2 2mg  1/18 2.7 2mg  daily  Plan:  INR therapeutic at 2.7 (goal 2.5-3.5) Stop heparin infusion Start warfarin 2mg  daily Continue daily INR until stable   Vanessa Carlson PharmD, BCPS 09/16/2022 7:56 AM

## 2022-09-16 NOTE — Discharge Summary (Incomplete)
Physician Discharge Summary   Patient: Vanessa Carlson MRN: [id number] DOB: [birth year]  Admit date:     09/10/2022  Discharge date: {dischdate:26783}  Discharge Physician: Adan Sis   PCP: Virginia Crews, MD   Recommendations at discharge:  {Tip this will not be part of the note when signed- Example include specific recommendations for outpatient follow-up, pending tests to follow-up on. (Optional):26781}  ***  Discharge Diagnoses: Principal Problem:   Supratherapeutic INR Active Problems:   HTN (hypertension)   Coronary artery disease involving native heart   Paroxysmal atrial fibrillation (HCC)   Chronic anticoagulation   Morbid obesity (HCC)   Hyperlipidemia LDL goal <70   Primary lung adenocarcinoma, right (HCC)   Pleural effusion   Hypotension   AKI (acute kidney injury) ([place])   Elevated bilirubin  Resolved Problems:   * No resolved hospital problems. [facility] Course: [name] is a 62 year old female with primary lung adenocarcinoma on the right side, hypertension, hyperlipidemia, paroxysmal atrial fibrillation, on chronic anticoagulation with warfarin, who presents emergency department from oncology office for chief concerns of elevated INR.  Initial vitals in the emergency department showed temperature of 98.6, respiration rate of 17, heart rate of 102, blood pressure 102/64, SpO2 99% on room air.  Serum sodium is 137, potassium 3.5, chloride 98, bicarb 27, BUN of 20, serum creatinine 1.97, GFR of 28, nonfasting blood glucose 119, WBC 10.2, hemoglobin 13.8, platelets of 229.  INR was greater than 10.  At the request of Dr. Janese Banks, CT chest abdomen and pelvis without contrast was ordered: Read as enlarging right upper lobe mass consistent with progressive lung cancer.  Large multiloculated right pleural effusion, increased in size.  Compressive atelectasis within the right middle and lower lobe.  New and enlarging left lung pulmonary nodules  consistent with progressive metastatic disease.  Gallbladder sludge.  No evidence of cholelithiasis or cholecystitis.  Grossly stable borderline enlarged mediastinal adenopathy.  Indeterminate 8 mm hypodensity inferior right lobe liver, not well-seen on prior exam.  Metastatic disease cannot be excluded.  ED treatment: Vitamin K 2.5 P.o.  Assessment and Plan: * Supratherapeutic INR - Phytonadione 2.5 mg was discontinued and ordered 5 mg on admission, this was discontinued due to hypotension - We will order Kcentra, vitamin K 10 mg IV - Recheck INR  - CBC in a.m.  AKI (acute kidney injury) ([place]) - With baseline CKD stage IIIa - Check BMP in the a.m.  Hypotension - Status post sodium chloride 500 mL bolus per EDP - Ordered additional sodium chloride 500 mL bolus - Check stat hemoglobin/hematocrit - Discussed with cross coverage provider - PCCM has been consulted via secure chat today to [place] - Kcentra IV and vitamin K 10 mg and dextrose IV ordered stat; called pharmacy to confirm.  Pharmacy states that it is being prepared and will walk down as soon as possible  Pleural effusion Multiloculated, increased in size from prior exam - AM team to consult pulmonology/IR as indicated - This was not done on admission due to elevated INR  Hyperlipidemia LDL goal <70 - Atorvastatin 80 mg daily resumed  Morbid obesity ([place]) - This complicates overall care and prognosis.   Paroxysmal atrial fibrillation (HCC) - Warfarin not resumed on admission  HTN (hypertension) - Antihypertensive medication Entresto, metoprolol succinate, spironolactone were not resumed admission due to hypotension - Patient states that she did not take these medications today       {Tip this will not be part of the  note when signed Body mass index is 37.3 kg/m. ,  Nutrition Documentation    Flowsheet Row ED to Hosp-Admission (Current) from 09/10/2022 in [facility]  Nutrition Problem  Inadequate oral intake  Etiology inability to eat  Nutrition Goal Patient will meet greater than or equal to 90% of their needs  Interventions Ensure Enlive (each supplement provides 350kcal and 20 grams of protein), MVI     ,  (Optional):26781}  {(NOTE) Pain control PDMP Statment (Optional):26782} Consultants: *** Procedures performed: ***  Disposition: {Plan; Disposition:26390} Diet recommendation:  Discharge Diet Orders (From admission, onward)     Start     Ordered   09/16/22 0000  Diet - low sodium heart healthy        09/16/22 1126           {Diet_Plan:26776} DISCHARGE MEDICATION: Allergies as of 09/16/2022   No Known Allergies      Medication List     STOP taking these medications    Entresto 24-26 MG Generic drug: sacubitril-valsartan   furosemide 40 MG tablet Commonly known as: LASIX   spironolactone 25 MG tablet Commonly known as: ALDACTONE       TAKE these medications    aspirin 81 MG chewable tablet Chew 81 mg by mouth daily.   atorvastatin 80 MG tablet Commonly known as: LIPITOR TAKE 1 TABLET BY MOUTH ONCE DAILY. OFFICE VISIT AND LABS NEEDED FOR FURTHER REFILLS.   ezetimibe 10 MG tablet Commonly known as: ZETIA Take 1 tablet (10 mg total) by mouth daily.   gabapentin 300 MG capsule Commonly known as: NEURONTIN Take 1 capsule (300 mg total) by mouth 2 (two) times daily.   metolazone 2.5 MG tablet Commonly known as: ZAROXOLYN Take 1 tablet (2.5 mg) by mouth once a week. Take 30 minutes prior to your furosemide dose.   metoprolol succinate 25 MG 24 hr tablet Commonly known as: TOPROL-XL TAKE 1 TABLET BY MOUTH TWICE DAILY IN THE MORNING AND AT BEDTIME   midodrine 10 MG tablet Commonly known as: PROAMATINE Take 1 tablet (10 mg total) by mouth 3 (three) times daily with meals.   oxybutynin 10 MG 24 hr tablet Commonly known as: DITROPAN-XL Take 1 tablet (10 mg total) by mouth daily.   oxyCODONE 5 MG immediate release  tablet Commonly known as: Oxy IR/ROXICODONE Take 1 tablet (5 mg total) by mouth every 4 (four) hours as needed for severe pain.   potassium chloride SA 20 MEQ tablet Commonly known as: KLOR-CON M Take 2 tablets (40 mEq total) by mouth daily.   warfarin 5 MG tablet Commonly known as: COUMADIN Take as directed. If you are unsure how to take this medication, talk to your nurse or doctor. Original instructions: 5 mg daily except 7.5 mg on Mon and Fri What changed:  how much to take how to take this when to take this additional instructions        Discharge Exam: Filed Weights   09/11/22 0332  Weight: 92.5 kg   ***  Condition at discharge: {DC Condition:26389}  The results of significant diagnostics from this hospitalization (including imaging, microbiology, ancillary and laboratory) are listed below for reference.   Imaging Studies: US THORACENTESIS ASP PLEURAL SPACE W/IMG GUIDE  Result Date: 09/13/2022 INDICATION: Patient with history of non-small cell lung cancer with recurrent right pleural effusion. Request received for therapeutic thoracentesis. EXAM: ULTRASOUND GUIDED THERAPEUTIC RIGHT THORACENTESIS MEDICATIONS: 10 mL 1 % lidocaine COMPLICATIONS: None immediate. PROCEDURE: An ultrasound guided thoracentesis was  thoroughly discussed with the patient and questions answered. The benefits, risks, alternatives and complications were also discussed. The patient understands and wishes to proceed with the procedure. Written consent was obtained. Ultrasound was performed to localize and mark an adequate pocket of fluid in the right chest. The area was then prepped and draped in the normal sterile fashion. 1% Lidocaine was used for local anesthesia. Under ultrasound guidance a 6 Fr Safe-T-Centesis catheter was introduced. Thoracentesis was performed. The catheter was removed and a dressing applied. FINDINGS: A total of approximately 1.3 L of dark, bloody fluid was removed. IMPRESSION:  Successful ultrasound guided right thoracentesis yielding 1.3 L of pleural fluid. Read by: Narda Rutherford, AGNP-BC Electronically Signed   By: Miachel Roux M.D.   On: 09/13/2022 14:39   DG Chest Port 1 View  Result Date: 09/13/2022 CLINICAL DATA:  Post thora EXAM: PORTABLE CHEST - 1 VIEW COMPARISON:  CT 09/10/2022 FINDINGS: No pneumothorax. Persistent moderate right pleural effusion, with atelectasis/consolidation at the right lung base. Left lung clear. Stable right IJ port catheter to the distal SVC. Stable left subclavian AICD. Heart size and mediastinal contours are within normal limits. Post MVR. Sternotomy wires. IMPRESSION: Persistent moderate right pleural effusion. No pneumothorax post thoracentesis. Electronically Signed   By: Lucrezia Europe M.D.   On: 09/13/2022 14:11   ECHOCARDIOGRAM COMPLETE  Result Date: 09/11/2022    ECHOCARDIOGRAM REPORT   Patient Name:   ALEGRA ROST Barreiro Date of Exam: 09/11/2022 Medical Rec #:  [id number]         Height:       62.0 in Accession #:    [id number]        Weight:       203.9 lb Date of Birth:  Apr 27, 1961          BSA:          1.928 m Patient Age:    12 years          BP:           93/62 mmHg Patient Gender: F                 HR:           83 bpm. Exam Location:  [facility] Procedure: 2D Echo and Intracardiac Opacification Agent Indications:     CHF I50.21  History:         Patient has prior history of Echocardiogram examinations, most                  recent 01/28/2022.                   Mitral Valve: 27 mm mechanical valve valve is present in the                  mitral position.  Sonographer:     Kathlen Brunswick RDCS Referring Phys:  [id number] [place] Diagnosing Phys: Eleonore Chiquito MD  Sonographer Comments: Technically difficult study due to poor echo windows and suboptimal subcostal window. Image acquisition challenging due to patient body habitus. IMPRESSIONS  1. Left ventricular ejection fraction, by estimation, is 20 to 25%. The left ventricle has severely  decreased function. The left ventricle demonstrates regional wall motion abnormalities (see scoring diagram/findings for description). Left ventricular diastolic function could not be evaluated.  2. Right ventricular systolic function is normal. The right ventricular size is normal. There is normal pulmonary artery systolic pressure. The estimated right ventricular systolic pressure is  31.7 mmHg.  3. The mitral valve has been repaired/replaced. No evidence of mitral valve regurgitation. The mean mitral valve gradient is 2.0 mmHg with average heart rate of 85 bpm. There is a 27 mm mechanical valve present in the mitral position. Echo findings are consistent with normal structure and function of the mitral valve prosthesis.  4. The aortic valve is tricuspid. Aortic valve regurgitation is not visualized. No aortic stenosis is present.  5. The inferior vena cava is normal in size with greater than 50% respiratory variability, suggesting right atrial pressure of 3 mmHg. Comparison(s): No significant change from prior study. Conclusion(s)/Recommendation(s): Findings consistent with ischemic cardiomyopathy. No left ventricular mural or apical thrombus/thrombi. FINDINGS  Left Ventricle: Left ventricular ejection fraction, by estimation, is 20 to 25%. The left ventricle has severely decreased function. The left ventricle demonstrates regional wall motion abnormalities. Definity contrast agent was given IV to delineate the left ventricular endocardial borders. The left ventricular internal cavity size was normal in size. There is no left ventricular hypertrophy. Left ventricular diastolic function could not be evaluated due to mitral valve replacement. Left ventricular  diastolic function could not be evaluated.  LV Wall Scoring: The mid and distal anterior septum, apical lateral segment, apical anterior segment, and apex are akinetic. The anterior wall, antero-lateral wall, entire inferior wall, posterior wall, basal  anteroseptal segment, mid inferoseptal segment, and basal inferoseptal segment are hypokinetic. Right Ventricle: The right ventricular size is normal. No increase in right ventricular wall thickness. Right ventricular systolic function is normal. There is normal pulmonary artery systolic pressure. The tricuspid regurgitant velocity is 2.68 m/s, and  with an assumed right atrial pressure of 3 mmHg, the estimated right ventricular systolic pressure is 31.7 mmHg. Left Atrium: Left atrial size was normal in size. Right Atrium: Right atrial size was normal in size. Pericardium: There is no evidence of pericardial effusion. Mitral Valve: The mitral valve has been repaired/replaced. No evidence of mitral valve regurgitation. There is a 27 mm mechanical valve present in the mitral position. Echo findings are consistent with normal structure and function of the mitral valve prosthesis. MV peak gradient, 4.8 mmHg. The mean mitral valve gradient is 2.0 mmHg with average heart rate of 85 bpm. Tricuspid Valve: The tricuspid valve is grossly normal. Tricuspid valve regurgitation is mild . No evidence of tricuspid stenosis. Aortic Valve: The aortic valve is tricuspid. Aortic valve regurgitation is not visualized. No aortic stenosis is present. Aortic valve peak gradient measures 6.2 mmHg. Pulmonic Valve: The pulmonic valve was grossly normal. Pulmonic valve regurgitation is mild. No evidence of pulmonic stenosis. Aorta: The aortic root and ascending aorta are structurally normal, with no evidence of dilitation. Venous: The inferior vena cava is normal in size with greater than 50% respiratory variability, suggesting right atrial pressure of 3 mmHg. IAS/Shunts: The atrial septum is grossly normal. Additional Comments: A device lead is visualized in the right atrium and right ventricle.  LEFT VENTRICLE PLAX 2D LVIDd:         5.20 cm     Diastology LVIDs:         4.70 cm     LV e' medial:    6.53 cm/s LV PW:         0.80 cm     LV  E/e' medial:  11.6 LV IVS:        0.80 cm     LV e' lateral:   8.16 cm/s LVOT diam:     1.65 cm  LV E/e' lateral: 9.3 LV SV:         28 LV SV Index:   14 LVOT Area:     2.14 cm  LV Volumes (MOD) LV vol d, MOD A2C: 98.8 ml LV vol d, MOD A4C: 94.4 ml LV vol s, MOD A2C: 49.1 ml LV vol s, MOD A4C: 63.6 ml LV SV MOD A2C:     49.8 ml LV SV MOD A4C:     94.4 ml LV SV MOD BP:      42.9 ml RIGHT VENTRICLE RV Basal diam:  2.20 cm LEFT ATRIUM             Index LA diam:        3.50 cm 1.82 cm/m LA Vol (A2C):   82.2 ml 42.64 ml/m LA Vol (A4C):   30.2 ml 15.67 ml/m LA Biplane Vol: 51.7 ml 26.82 ml/m  AORTIC VALVE                 PULMONIC VALVE AV Area (Vmax): 1.28 cm     PV Vmax:          0.90 m/s AV Vmax:        124.00 cm/s  PV Peak grad:     3.2 mmHg AV Peak Grad:   6.2 mmHg     PR End Diast Vel: 5.20 msec LVOT Vmax:      74.20 cm/s LVOT Vmean:     46.800 cm/s LVOT VTI:       0.130 m  AORTA Ao Root diam: 2.70 cm Ao Asc diam:  2.40 cm MITRAL VALVE                TRICUSPID VALVE MV Area (PHT): 3.27 cm     TR Peak grad:   28.7 mmHg MV Area VTI:   1.27 cm     TR Vmax:        268.00 cm/s MV Peak grad:  4.8 mmHg MV Mean grad:  2.0 mmHg     SHUNTS MV Vmax:       1.10 m/s     Systemic VTI:  0.13 m MV Vmean:      55.7 cm/s    Systemic Diam: 1.65 cm MV Decel Time: 232 msec MV E velocity: 75.50 cm/s MV A velocity: 112.00 cm/s MV E/A ratio:  0.67 Lennie Odor MD Electronically signed by Lennie Odor MD Signature Date/Time: 09/11/2022/5:40:25 PM    Final    CT CHEST ABDOMEN PELVIS WO CONTRAST  Result Date: 09/10/2022 CLINICAL DATA:  Non-small cell lung cancer, elevated INR EXAM: CT CHEST, ABDOMEN AND PELVIS WITHOUT CONTRAST TECHNIQUE: Multidetector CT imaging of the chest, abdomen and pelvis was performed following the standard protocol without IV contrast. Examination was performed without intravenous contrast at the request of the ordering physician. Evaluation of the soft tissues, vascular structures, and solid viscera is  limited. RADIATION DOSE REDUCTION: This exam was performed according to the departmental dose-optimization program which includes automated exposure control, adjustment of the mA and/or kV according to patient size and/or use of iterative reconstruction technique. COMPARISON:  05/21/2022, 06/10/2022 FINDINGS: CT CHEST FINDINGS Cardiovascular: Stable cardiac pacer. Mitral valve prosthesis. No pericardial effusion. Normal caliber of the thoracic aorta. Stable atherosclerosis of the aorta and coronary vasculature. Mediastinum/Nodes: Thyroid, trachea, and esophagus are grossly unremarkable. 9 mm short axis pretracheal lymph node reference image 17/2, previously measuring approximately 12 mm. Evaluation of the mediastinum and hilar regions is limited due to the lack of intravenous contrast.  Lungs/Pleura: Right upper lobe mass measures 6.9 x 5.0 cm reference image 15/2, previously measuring 6.4 x 4.6 cm, compatible with recurrent right upper lobe lung cancer. There is a large multiloculated right pleural effusion. Compressive atelectasis is seen within the right middle and right lower lobes, with minimal aeration superior segment right lower lobe. There is increase in the size and number of left lung pulmonary nodules seen on prior study, consistent with progressive metastatic disease. Index nodule left upper lobe image 34/4 measures 12 x 8 mm, previously 6 x 5 mm. No left-sided effusion. No pneumothorax. Musculoskeletal: Stable right anterolateral second and third rib fractures. No new bony abnormalities. Reconstructed images demonstrate no additional findings. CT ABDOMEN PELVIS FINDINGS Hepatobiliary: Gallbladder sludge layering dependently within the gallbladder. No calcified gallstones or cholecystitis. Indeterminate 8 mm hypodensity inferior right lobe liver image 59/2, metastatic disease cannot be excluded. No biliary duct dilation. Pancreas: Unremarkable unenhanced appearance. Spleen: Unremarkable unenhanced  appearance. Adrenals/Urinary Tract: No urinary tract calculi or obstructive uropathy. The adrenals and bladder are grossly unremarkable. Stomach/Bowel: No bowel obstruction or ileus. No bowel wall thickening or inflammatory change. Vascular/Lymphatic: Aortic atherosclerosis. No enlarged abdominal or pelvic lymph nodes. Reproductive: Prior hysterectomy.  No adnexal masses. Other: No free fluid or free intraperitoneal gas. No abdominal wall hernia. Musculoskeletal: No acute or destructive bony lesions. Reconstructed images demonstrate no additional findings. IMPRESSION: 1. Enlarging right upper lobe mass consistent with progressive lung cancer. 2. Large multiloculated right pleural effusion, increased in size since prior study. Significant compressive atelectasis within the right middle and right lower lobe, with only a small portion of normally aerated lung within the superior segment right lower lobe. 3. New and enlarging left lung pulmonary nodules consistent with progressive metastatic disease. 4. Indeterminate 8 mm hypodensity inferior right lobe liver, not well seen on previous exams. Metastatic disease cannot be excluded. 5. Grossly stable borderline enlarged mediastinal adenopathy. 6. Gallbladder sludge. No evidence of cholelithiasis or cholecystitis. 7.  Aortic Atherosclerosis (ICD10-I70.0). Electronically Signed   By: Randa Ngo M.D.   On: 09/10/2022 17:50   US THORACENTESIS ASP PLEURAL SPACE W/IMG GUIDE  Result Date: 08/26/2022 INDICATION: 62 year old woman with history of non-small cell lung cancer and recurrent right pleural effusion returns to radiology for thoracentesis EXAM: ULTRASOUND GUIDED RIGHT THORACENTESIS MEDICATIONS: None. COMPLICATIONS: None immediate. PROCEDURE: An ultrasound guided thoracentesis was thoroughly discussed with the patient and questions answered. The benefits, risks, alternatives and complications were also discussed. The patient understands and wishes to proceed with  the procedure. Written consent was obtained. Ultrasound was performed to localize and mark an adequate pocket of fluid in the right chest. The area was then prepped and draped in the normal sterile fashion. 1% Lidocaine was used for local anesthesia. Under ultrasound guidance a 6 Fr Safe-T-Centesis catheter was introduced. Thoracentesis was performed. The catheter was removed and a dressing applied. FINDINGS: A total of approximately 1.4 L of dark brown fluid was removed. IMPRESSION: Successful ultrasound guided right thoracentesis yielding 1.4 L of pleural fluid. Electronically Signed   By: Miachel Roux M.D.   On: 08/26/2022 16:26   DG Chest Port 1 View  Result Date: 08/26/2022 CLINICAL DATA:  Status post right thoracentesis EXAM: PORTABLE CHEST 1 VIEW COMPARISON:  Previous CT done on 05/21/2022, chest radiograph done on 07/08/2022 FINDINGS: There is moderate to large right pleural effusion with interval increase. There is no pneumothorax. There is homogeneous opacity in the medial right upper lung field consistent with neoplastic process seen in the previous PET-CT.  There is interval decrease in volume in right lung. Evaluation of right mid and right lower lung fields for new infiltrates is limited by the effusion. Left lung is clear. Pacemaker/defibrillator battery is seen in the left infraclavicular region. Right IJ chest port is seen with its tip in superior vena cava. There is evidence of previous surgery in right shoulder. IMPRESSION: There is no pneumothorax. Moderate to large right pleural effusion with interval increase in size since 07/08/2022. Other findings as described in the body of the report. Electronically Signed   By: Elmer Picker M.D.   On: 08/26/2022 16:19    Microbiology: Results for orders placed or performed during the hospital encounter of 09/10/22  MRSA Next Gen by PCR, Nasal     Status: None   Collection Time: 09/11/22  3:24 AM   Specimen: Nasal Mucosa; Nasal Swab   Result Value Ref Range Status   MRSA by PCR Next Gen NOT DETECTED NOT DETECTED Final    Comment: (NOTE) The GeneXpert MRSA Assay (FDA approved for NASAL specimens only), is one component of a comprehensive MRSA colonization surveillance program. It is not intended to diagnose MRSA infection nor to guide or monitor treatment for MRSA infections. Test performance is not FDA approved in patients less than 37 years old. Performed at [facility], [address] [place], [place] [address]   Culture, blood (Routine X 2) w Reflex to ID Panel     Status: None   Collection Time: 09/11/22  7:30 AM   Specimen: BLOOD  Result Value Ref Range Status   Specimen Description BLOOD BLOOD LEFT ARM  Final   Special Requests   Final    BOTTLES DRAWN AEROBIC ONLY Blood Culture results may not be optimal due to an inadequate volume of blood received in culture bottles   Culture   Final    NO GROWTH 5 DAYS Performed at [facility], [address] [place], [place] [address]    Report Status 09/16/2022 FINAL  Final  Culture, blood (Routine X 2) w Reflex to ID Panel     Status: None   Collection Time: 09/11/22  7:44 AM   Specimen: BLOOD  Result Value Ref Range Status   Specimen Description BLOOD BLOOD RIGHT HAND  Final   Special Requests   Final    BOTTLES DRAWN AEROBIC AND ANAEROBIC Blood Culture adequate volume   Culture   Final    NO GROWTH 5 DAYS Performed at [facility], [address], [place], [place] [address]    Report Status 09/16/2022 FINAL  Final   *Note: Due to a large number of results and/or encounters for the requested time period, some results have not been displayed. A complete set of results can be found in Results Review.    Labs: CBC: Recent Labs  Lab 09/10/22 1329 09/10/22 1720 09/11/22 0444 09/12/22 0430 09/13/22 0437 09/14/22 0420 09/15/22 0435  WBC 10.6*   < > 11.4* 10.6* 9.3 9.4 9.0  NEUTROABS 7.8*  --   --   --   --   --   --    HGB 13.7   < > 12.9 11.7* 10.9* 11.8* 11.1*  HCT 43.1   < > 41.2 37.9 34.8* 36.9 34.4*  MCV 90.0   < > 91.8 91.5 91.1 89.3 89.6  PLT 246   < > 200 175 178 152 145*   < > = values in this interval not displayed.   Basic Metabolic Panel: Recent Labs  Lab 09/10/22 1857 09/11/22 0444 09/11/22 1057 09/12/22 0430 09/13/22 0437 09/13/22 0500 09/14/22 0420 09/14/22 0424 09/15/22 0435  NA  --  136  --  134* 135  --   --  135 135  K  --  3.3*  --  4.3 3.9  --   --  4.2 4.0  CL  --  100  --  100 101  --   --  100 100  CO2  --  26  --  27 29  --   --  27 25  GLUCOSE  --  109*  --  106* 98  --   --  96 110*  BUN  --  23  --  24* 16  --   --  15 14  CREATININE  --  2.16*  --  1.47* 1.14*  --   --  1.24* 1.48*  CALCIUM  --  8.6*  --  8.6* 8.5*  --   --  8.7* 8.1*  MG 2.5*  --   --   --   --  2.4 2.3  --  2.3  PHOS 1.9*  --  2.2* 2.1* 1.7*  --   --  1.5* 2.0*   Liver Function Tests: Recent Labs  Lab 09/10/22 1329 09/10/22 1720 09/13/22 0437 09/14/22 0424 09/15/22 0435  AST 31 33  --   --   --   ALT 17 17  --   --   --   ALKPHOS 193* 203*  --   --   --   BILITOT 1.8* 2.3*  --   --   --   PROT 8.1 8.5*  --   --   --   ALBUMIN 4.0 4.2 3.3* 3.4* 3.2*   CBG: Recent Labs  Lab 09/11/22 0307  GLUCAP 109*    Discharge time spent: {LESS THAN/GREATER THAN:26388} 30 minutes.  Signed: Florencia Reasons, MD [facility] 09/16/2022

## 2022-09-16 NOTE — Progress Notes (Signed)
PHARMACY CONSULT NOTE - ELECTROLYTES  Pharmacy Consult for Electrolyte Monitoring and Replacement   Recent Labs: Potassium (mmol/L)  Date Value  09/15/2022 4.0   Magnesium (mg/dL)  Date Value  88/55/6363 2.3   Calcium (mg/dL)  Date Value  30/19/7361 8.1 (L)   Albumin (g/dL)  Date Value  49/29/8821 3.2 (L)  08/18/2018 4.2   Phosphorus (mg/dL)  Date Value  36/53/6640 2.0 (L)   Sodium (mmol/L)  Date Value  09/15/2022 135  12/19/2019 145 (H)   Corrected Ca: 9.2 mg/dL  Assessment  Vanessa Carlson is a 62 y.o. female presenting with hemoptysis with INR >10. 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 Pharmacy has been consulted to monitor and replace phosphorous.  Diet: Regular  Goal of Therapy: Electrolytes WNL  Plan: no results available. No replacement today  Thank you for allowing pharmacy to be a part of this patient's care.  Albin Duckett Rodriguez-Guzman PharmD, BCPS 09/16/2022 7:59 AM

## 2022-09-16 NOTE — Progress Notes (Addendum)
Discharge instructions, med list, chf teaching reviewed with pt who verbalized understanding.  Port a cath, IV, and tele monitor removed.  Pt waiting for a friend to pick her up for discharge.  1600 - pt discharged via WC without incident

## 2022-09-17 ENCOUNTER — Inpatient Hospital Stay: Payer: Medicare HMO

## 2022-09-17 ENCOUNTER — Inpatient Hospital Stay: Payer: Medicare HMO | Admitting: Nurse Practitioner

## 2022-09-17 ENCOUNTER — Telehealth: Payer: Self-pay

## 2022-09-17 DIAGNOSIS — R9089 Other abnormal findings on diagnostic imaging of central nervous system: Secondary | ICD-10-CM

## 2022-09-17 NOTE — Patient Outreach (Signed)
  Care Coordination TOC Note Transition Care Management Follow-up Telephone Call Date of discharge and from where: [facility] 09/16/22 How have you been since you were released from the hospital? "I am doing fine, a bit weak" Any questions or concerns? No  Items Reviewed: Did the pt receive and understand the discharge instructions provided? Yes  Medications obtained and verified? Yes  Other? No  Any new allergies since your discharge? No  Dietary orders reviewed? No Do you have support at home? Yes   Home Care and Equipment/Supplies: Were home health services ordered? no If so, what is the name of the agency? N/a  Has the agency set up a time to come to the patient's home? not applicable Were any new equipment or medical supplies ordered?  No What is the name of the medical supply agency? N/a Were you able to get the supplies/equipment? not applicable Do you have any questions related to the use of the equipment or supplies? No  Functional Questionnaire: (I = Independent and D = Dependent) ADLs: I  Bathing/Dressing- I  Meal Prep- I  Eating- I  Maintaining continence- I  Transferring/Ambulation- I  Managing Meds- I  Follow up appointments reviewed:  PCP Hospital f/u appt confirmed? No   Specialist Hospital f/u appt confirmed? Yes  Scheduled to see Dr. Okey Dupre on 10/27/2022 @ 11:00. Are transportation arrangements needed? No  If their condition worsens, is the pt aware to call PCP or go to the Emergency Dept.? No Was the patient provided with contact information for the PCP's office or ED? No Was to pt encouraged to call back with questions or concerns? No  SDOH assessments and interventions completed:   Yes SDOH Interventions Today    Flowsheet Row Most Recent Value  SDOH Interventions   Food Insecurity Interventions Intervention Not Indicated  Housing Interventions Intervention Not Indicated       Care Coordination Interventions:  No Care Coordination interventions needed  at this time.   Encounter Outcome:  Pt. Visit Completed

## 2022-09-18 NOTE — Progress Notes (Signed)
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  No evidence of nodal involvement.  She was not deemed to be a candidate for surgery and underwent SBRT to this lesion.  This was biopsy-proven adenocarcinoma with acinar and solid morphologies.  She then had a CT scan in January 2020 which showed enlarging satellite right upper lobe lung nodules.  This was followed by a PET CT scan which showed right upper lobe lung nodules 2 of them were hypermetabolic with an SUV of 8.7 was also a thyroid nodule approximately 4 mm in size which was not definitely hypermetabolic.  Patient was seen by Dr. Aggie Cosier following this and sh received SBRT for 5 fractions encompassing all these 3 nodules.  Patient has not had any systemic chemotherapy for her lung cancer so far.  There is a gradual increase in the size of her right upper lobe lung  nodules in May 2022 and patient received SBRT to this area again by Dr. Aggie Cosier   Scans up until May 2023 showed stable postradiation changes in the right upper lobe.  Mildly enlarged right paratracheal but stable as compared to 3 months prior.  No new findings of metastatic disease.  However CT chest in September 2023 showed considerable increase in the area ofRight upper lobe lung mass from pre-existing 5.5 cm which included area of radiation fibrosis to 6.4 cm.  Enlargement of the left upper lobe lung nodule from 0.4 to 0.6 cm.  New moderate right pleural effusion interval enlargement of the pretracheal lymph nodes to 1.2 cm   Patient underwent right-sided thoracentesis which was consistent with adenocarcinoma of lung origin  Interval history-patient does not feel well today.  She has not had some workup of hemoptysis yesterday.  She feels more tired today.  Oral intake has been poor.  ECOG PS- 2-3 Pain scale- 0  Review of systems- Review of Systems  Constitutional:  Positive for malaise/fatigue and weight loss. Negative for chills and fever.  HENT:  Negative for congestion, ear discharge and nosebleeds.   Eyes:  Negative for blurred vision.  Respiratory:  Positive for hemoptysis. Negative for cough, sputum production, shortness of breath and wheezing.   Cardiovascular:  Negative for chest pain, palpitations, orthopnea and claudication.  Gastrointestinal:  Negative for abdominal pain, blood in stool, constipation, diarrhea, heartburn, melena, nausea and vomiting.  Genitourinary:  Negative for dysuria, flank pain, frequency, hematuria and urgency.  Musculoskeletal:  Negative for back pain, joint pain and myalgias.  Skin:  Negative for rash.  Neurological:  Negative for dizziness, tingling, focal weakness, seizures, weakness and headaches.  Endo/Heme/Allergies:  Does not bruise/bleed easily.  Psychiatric/Behavioral:  Negative for depression and suicidal ideas. The patient does not have  insomnia.       No Known Allergies   Past Medical History:  Diagnosis Date   AICD (automatic cardioverter/defibrillator) present    Anterior myocardial infarction (HCC)    a. 10/2014 - occluded LAD, complicated by cardiogenic shock-->Med Rx as interventional team was unable to open LAD.   Arthritis    arthirits of knee on right   Cancer of upper lobe of right lung (HCC) 09/2017   radiation therapy right side   Cervical cancer (HCC)    in remission for ~20 yrs, s/p hysterectomy   Chronic combined systolic and diastolic CHF (congestive heart failure) (HCC)    a. 10/2015 Echo: EF 20-25%, sev diast dysfxn, mildly reduced RV fxn. Nl MV prosthesis; b. 12/2017 Echo: EF 20-25%, diff HK. Sev ant/antsept HK, apical AK. Mild MS (mean graad ). Mod TR. PASP .   Coronary artery disease    a. 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   Essential hypertension    Ischemic cardiomyopathy    a. 03/2015 s/p MDT single lead AICD;  b. 10/2015 Echo: EF 20-25%;  c. 12/2017 Echo: EF 20-25%, diff HK.   Mass of upper lobe of right lung    a. noted on CXR & CT 08/2017 w/ abnl PET CT.   Mitral valve disease    a. 2006 s/p MVR w/ 6mm SJM bileaflet mechanical valve-->chronic coumadin/ASA; b. 12/2017 Echo: Mild MS. Mean gradient .   Personal history of radiation therapy    f/u lung ca     Past Surgical History:  Procedure Laterality Date   APPENDECTOMY  1998   BREAST EXCISIONAL BIOPSY Left 80s   benign   CARDIAC CATHETERIZATION     CARDIAC DEFIBRILLATOR PLACEMENT     COLONOSCOPY WITH PROPOFOL N/A 01/11/2020   Procedure: COLONOSCOPY WITH PROPOFOL;  Surgeon: Wyline Mood, MD;  Location: [facility] ENDOSCOPY;  Service: Gastroenterology;  Laterality: N/A;   IR IMAGING GUIDED PORT INSERTION  06/29/2022   PORTA CATH INSERTION     RADICAL HYSTERECTOMY  1998   REPLACEMENT TOTAL KNEE Right 12/10/2021   SHOULDER SURGERY  Bilateral    rotator cuff tears   VALVE REPLACEMENT     Mitral valve; 27 mm St. Jude bileaflet valve    Social History   Socioeconomic History   Marital status: Married    Spouse name: Tosin   Number of children: 2   Years of education: 14   Highest education level: Some college, no degree  Occupational History   Occupation: disable  Tobacco Use   Smoking status: Former    Packs/day: 1.00    Years: 4.00    Total pack years: 4.00    Types: Cigarettes    Quit date: 1998    Years since quitting: 26.0    Passive exposure: Past   Smokeless tobacco: Never  Vaping Use   Vaping Use: Never used  Substance and Sexual Activity   Alcohol use: No   Drug use: No   Sexual activity: Yes    Partners: Male    Birth control/protection: Surgical  Other Topics Concern   Not on file  Social History Narrative   Not on file   Social Determinants of Health   Financial Resource Strain: Low  Risk  (10/29/2021)   Overall Financial Resource Strain (CARDIA)    Difficulty of Paying Living Expenses: Not hard at all  Food Insecurity: No Food Insecurity (09/17/2022)   Hunger Vital Sign    Worried About Running Out of Food in the Last Year: Never true    Ran Out of Food in the Last Year: Never true  Transportation Needs: No Transportation Needs (09/11/2022)   PRAPARE - Administrator, Civil Service (Medical): No    Lack of Transportation (Non-Medical): No  Physical Activity: Insufficiently Active (10/29/2021)   Exercise Vital Sign    Days of Exercise per Week: 2 days    Minutes of Exercise per Session: 20 min  Stress: No Stress Concern Present (10/29/2021)   [employer] of Occupational Health - Occupational Stress Questionnaire    Feeling of Stress : Not at all  Social Connections: Moderately Integrated (10/29/2021)   Social Connection and Isolation Panel [NHANES]    Frequency of Communication with Friends and Family: Three times a week    Frequency of Social Gatherings with Friends  and Family: Once a week    Attends Religious Services: More than 4 times per year    Active Member of [employer] or Organizations: No    Attends Banker Meetings: Never    Marital Status: Married  Catering manager Violence: Not At Risk (09/11/2022)   Humiliation, Afraid, Rape, and Kick questionnaire    Fear of Current or Ex-Partner: No    Emotionally Abused: No    Physically Abused: No    Sexually Abused: No    Family History  Problem Relation Age of Onset   Heart attack Mother    Hypertension Mother    Diabetes Mother    Emphysema Father        smoker   Diabetes Maternal Grandmother    Kidney disease Maternal Grandmother    Breast cancer Maternal Aunt    Breast cancer Maternal Aunt    Colon cancer Maternal Uncle      Current Outpatient Medications:    aspirin 81 MG chewable tablet, Chew 81 mg by mouth daily., Disp: , Rfl:    atorvastatin (LIPITOR) 80 MG tablet, TAKE 1 TABLET BY MOUTH ONCE DAILY. OFFICE VISIT AND LABS NEEDED FOR FURTHER REFILLS., Disp: 90 tablet, Rfl: 3   ezetimibe (ZETIA) 10 MG tablet, Take 1 tablet (10 mg total) by mouth daily., Disp: 30 tablet, Rfl: 2   gabapentin (NEURONTIN) 300 MG capsule, Take 1 capsule (300 mg total) by mouth 2 (two) times daily., Disp: 180 capsule, Rfl: 3   metolazone (ZAROXOLYN) 2.5 MG tablet, Take 1 tablet (2.5 mg) by mouth once a week. Take 30 minutes prior to your furosemide dose. (Patient not taking: Reported on 09/10/2022), Disp: 12 tablet, Rfl: 0   metoprolol succinate (TOPROL-XL) 25 MG 24 hr tablet, TAKE 1 TABLET BY MOUTH TWICE DAILY IN THE MORNING AND AT BEDTIME, Disp: 180 tablet, Rfl: 0   oxybutynin (DITROPAN-XL) 10 MG 24 hr tablet, Take 1 tablet (10 mg total) by mouth daily. (Patient not taking: Reported on 09/10/2022), Disp: 30 tablet, Rfl: 11   midodrine (PROAMATINE) 10 MG tablet, Take 1 tablet (10 mg total) by mouth 3 (three) times daily with meals., Disp: 30 tablet, Rfl: 0   oxyCODONE (OXY IR/ROXICODONE) 5 MG immediate  release tablet, Take 1 tablet (5 mg total) by mouth every 4 (four) hours as needed for severe pain., Disp: 30 tablet, Rfl: 0   potassium chloride SA (KLOR-CON  M) 20 MEQ tablet, Take 2 tablets (40 mEq total) by mouth daily., Disp: 60 tablet, Rfl: 6   warfarin (COUMADIN) 2 MG tablet, Take 1 tablet (2 mg total) by mouth daily at 4 PM., Disp: 30 tablet, Rfl: 0  Physical exam:  Vitals:   09/10/22 1358  BP: 102/64  Pulse: (!) 102  Resp: 17  Temp: (!) 96.6 F (35.9 C)  SpO2: 99%  Weight: 199 lb (90.3 kg)   Physical Exam Constitutional:      Comments: Sitting in the wheelchair.  Appears fatigued  Cardiovascular:     Rate and Rhythm: Regular rhythm. Tachycardia present.     Heart sounds: Normal heart sounds.  Pulmonary:     Effort: Pulmonary effort is normal.     Comments: Breath sounds decreased over the right lung base Abdominal:     General: Bowel sounds are normal.     Palpations: Abdomen is soft.  Musculoskeletal:     Cervical back: Normal range of motion.  Skin:    General: Skin is warm and dry.  Neurological:     Mental Status: She is alert and oriented to person, place, and time.         Latest Ref Rng & Units 09/15/2022    4:35 AM  CMP  Glucose 70 - 99 mg/dL 791   BUN 8 - 23 mg/dL 14   Creatinine 5.24 - 1.00 mg/dL 8.49   Sodium 483 - 559 mmol/L 135   Potassium 3.5 - 5.1 mmol/L 4.0   Chloride 98 - 111 mmol/L 100   CO2 22 - 32 mmol/L 25   Calcium 8.9 - 10.3 mg/dL 8.1       Latest Ref Rng & Units 09/15/2022    4:35 AM  CBC  WBC 4.0 - 10.5 K/uL 9.0   Hemoglobin 12.0 - 15.0 g/dL 97.6   Hematocrit 82.3 - 46.0 % 34.4   Platelets 150 - 400 K/uL 145     No images are attached to the encounter.  US THORACENTESIS ASP PLEURAL SPACE W/IMG GUIDE  Result Date: 09/13/2022 INDICATION: Patient with history of non-small cell lung cancer with recurrent right pleural effusion. Request received for therapeutic thoracentesis. EXAM: ULTRASOUND GUIDED THERAPEUTIC RIGHT  THORACENTESIS MEDICATIONS: 10 mL 1 % lidocaine COMPLICATIONS: None immediate. PROCEDURE: An ultrasound guided thoracentesis was thoroughly discussed with the patient and questions answered. The benefits, risks, alternatives and complications were also discussed. The patient understands and wishes to proceed with the procedure. Written consent was obtained. Ultrasound was performed to localize and mark an adequate pocket of fluid in the right chest. The area was then prepped and draped in the normal sterile fashion. 1% Lidocaine was used for local anesthesia. Under ultrasound guidance a 6 Fr Safe-T-Centesis catheter was introduced. Thoracentesis was performed. The catheter was removed and a dressing applied. FINDINGS: A total of approximately 1.3 L of dark, bloody fluid was removed. IMPRESSION: Successful ultrasound guided right thoracentesis yielding 1.3 L of pleural fluid. Read by: Alex Gardener, AGNP-BC Electronically Signed   By: Acquanetta Belling M.D.   On: 09/13/2022 14:39   DG Chest Port 1 View  Result Date: 09/13/2022 CLINICAL DATA:  Post thora EXAM: PORTABLE CHEST - 1 VIEW COMPARISON:  CT 09/10/2022 FINDINGS: No pneumothorax. Persistent moderate right pleural effusion, with atelectasis/consolidation at the right lung base. Left lung clear. Stable right IJ port catheter to the distal SVC. Stable left subclavian AICD. Heart size and mediastinal contours are within normal limits. Post MVR. Sternotomy wires. IMPRESSION:  Persistent moderate right pleural effusion. No pneumothorax post thoracentesis. Electronically Signed   By: Corlis Leak M.D.   On: 09/13/2022 14:11   ECHOCARDIOGRAM COMPLETE  Result Date: 09/11/2022    ECHOCARDIOGRAM REPORT   Patient Name:   PEARSON REASONS Timson Date of Exam: 09/11/2022 Medical Rec #:  [id number]         Height:       62.0 in Accession #:    [id number]        Weight:       203.9 lb Date of Birth:  November 15, 1960          BSA:          1.928 m Patient Age:    61 years          BP:            93/62 mmHg Patient Gender: F                 HR:           83 bpm. Exam Location:  [facility] Procedure: 2D Echo and Intracardiac Opacification Agent Indications:     CHF I50.21  History:         Patient has prior history of Echocardiogram examinations, most                  recent 01/28/2022.                   Mitral Valve: 27 mm mechanical valve valve is present in the                  mitral position.  Sonographer:     Overton Mam RDCS Referring Phys:  [id number] Ronnald Ramp O'NEAL Diagnosing Phys: Lennie Odor MD  Sonographer Comments: Technically difficult study due to poor echo windows and suboptimal subcostal window. Image acquisition challenging due to patient body habitus. IMPRESSIONS  1. Left ventricular ejection fraction, by estimation, is 20 to 25%. The left ventricle has severely decreased function. The left ventricle demonstrates regional wall motion abnormalities (see scoring diagram/findings for description). Left ventricular diastolic function could not be evaluated.  2. Right ventricular systolic function is normal. The right ventricular size is normal. There is normal pulmonary artery systolic pressure. The estimated right ventricular systolic pressure is 31.7 mmHg.  3. The mitral valve has been repaired/replaced. No evidence of mitral valve regurgitation. The mean mitral valve gradient is 2.0 mmHg with average heart rate of 85 bpm. There is a 27 mm mechanical valve present in the mitral position. Echo findings are consistent with normal structure and function of the mitral valve prosthesis.  4. The aortic valve is tricuspid. Aortic valve regurgitation is not visualized. No aortic stenosis is present.  5. The inferior vena cava is normal in size with greater than 50% respiratory variability, suggesting right atrial pressure of 3 mmHg. Comparison(s): No significant change from prior study. Conclusion(s)/Recommendation(s): Findings consistent with ischemic cardiomyopathy. No left ventricular mural or  apical thrombus/thrombi. FINDINGS  Left Ventricle: Left ventricular ejection fraction, by estimation, is 20 to 25%. The left ventricle has severely decreased function. The left ventricle demonstrates regional wall motion abnormalities. Definity contrast agent was given IV to delineate the left ventricular endocardial borders. The left ventricular internal cavity size was normal in size. There is no left ventricular hypertrophy. Left ventricular diastolic function could not be evaluated due to mitral valve replacement. Left ventricular  diastolic function could not be evaluated.  LV Wall Scoring:  The mid and distal anterior septum, apical lateral segment, apical anterior segment, and apex are akinetic. The anterior wall, antero-lateral wall, entire inferior wall, posterior wall, basal anteroseptal segment, mid inferoseptal segment, and basal inferoseptal segment are hypokinetic. Right Ventricle: The right ventricular size is normal. No increase in right ventricular wall thickness. Right ventricular systolic function is normal. There is normal pulmonary artery systolic pressure. The tricuspid regurgitant velocity is 2.68 m/s, and  with an assumed right atrial pressure of 3 mmHg, the estimated right ventricular systolic pressure is 31.7 mmHg. Left Atrium: Left atrial size was normal in size. Right Atrium: Right atrial size was normal in size. Pericardium: There is no evidence of pericardial effusion. Mitral Valve: The mitral valve has been repaired/replaced. No evidence of mitral valve regurgitation. There is a 27 mm mechanical valve present in the mitral position. Echo findings are consistent with normal structure and function of the mitral valve prosthesis. MV peak gradient, 4.8 mmHg. The mean mitral valve gradient is 2.0 mmHg with average heart rate of 85 bpm. Tricuspid Valve: The tricuspid valve is grossly normal. Tricuspid valve regurgitation is mild . No evidence of tricuspid stenosis. Aortic Valve: The aortic  valve is tricuspid. Aortic valve regurgitation is not visualized. No aortic stenosis is present. Aortic valve peak gradient measures 6.2 mmHg. Pulmonic Valve: The pulmonic valve was grossly normal. Pulmonic valve regurgitation is mild. No evidence of pulmonic stenosis. Aorta: The aortic root and ascending aorta are structurally normal, with no evidence of dilitation. Venous: The inferior vena cava is normal in size with greater than 50% respiratory variability, suggesting right atrial pressure of 3 mmHg. IAS/Shunts: The atrial septum is grossly normal. Additional Comments: A device lead is visualized in the right atrium and right ventricle.  LEFT VENTRICLE PLAX 2D LVIDd:         5.20 cm     Diastology LVIDs:         4.70 cm     LV e' medial:    6.53 cm/s LV PW:         0.80 cm     LV E/e' medial:  11.6 LV IVS:        0.80 cm     LV e' lateral:   8.16 cm/s LVOT diam:     1.65 cm     LV E/e' lateral: 9.3 LV SV:         28 LV SV Index:   14 LVOT Area:     2.14 cm  LV Volumes (MOD) LV vol d, MOD A2C: 98.8 ml LV vol d, MOD A4C: 94.4 ml LV vol s, MOD A2C: 49.1 ml LV vol s, MOD A4C: 63.6 ml LV SV MOD A2C:     49.8 ml LV SV MOD A4C:     94.4 ml LV SV MOD BP:      42.9 ml RIGHT VENTRICLE RV Basal diam:  2.20 cm LEFT ATRIUM             Index LA diam:        3.50 cm 1.82 cm/m LA Vol (A2C):   82.2 ml 42.64 ml/m LA Vol (A4C):   30.2 ml 15.67 ml/m LA Biplane Vol: 51.7 ml 26.82 ml/m  AORTIC VALVE                 PULMONIC VALVE AV Area (Vmax): 1.28 cm     PV Vmax:          0.90 m/s AV Vmax:  124.00 cm/s  PV Peak grad:     3.2 mmHg AV Peak Grad:   6.2 mmHg     PR End Diast Vel: 5.20 msec LVOT Vmax:      74.20 cm/s LVOT Vmean:     46.800 cm/s LVOT VTI:       0.130 m  AORTA Ao Root diam: 2.70 cm Ao Asc diam:  2.40 cm MITRAL VALVE                TRICUSPID VALVE MV Area (PHT): 3.27 cm     TR Peak grad:   28.7 mmHg MV Area VTI:   1.27 cm     TR Vmax:        268.00 cm/s MV Peak grad:  4.8 mmHg MV Mean grad:  2.0 mmHg      SHUNTS MV Vmax:       1.10 m/s     Systemic VTI:  0.13 m MV Vmean:      55.7 cm/s    Systemic Diam: 1.65 cm MV Decel Time: 232 msec MV E velocity: 75.50 cm/s MV A velocity: 112.00 cm/s MV E/A ratio:  0.67 Lennie Odor MD Electronically signed by Lennie Odor MD Signature Date/Time: 09/11/2022/5:40:25 PM    Final    CT CHEST ABDOMEN PELVIS WO CONTRAST  Result Date: 09/10/2022 CLINICAL DATA:  Non-small cell lung cancer, elevated INR EXAM: CT CHEST, ABDOMEN AND PELVIS WITHOUT CONTRAST TECHNIQUE: Multidetector CT imaging of the chest, abdomen and pelvis was performed following the standard protocol without IV contrast. Examination was performed without intravenous contrast at the request of the ordering physician. Evaluation of the soft tissues, vascular structures, and solid viscera is limited. RADIATION DOSE REDUCTION: This exam was performed according to the departmental dose-optimization program which includes automated exposure control, adjustment of the mA and/or kV according to patient size and/or use of iterative reconstruction technique. COMPARISON:  05/21/2022, 06/10/2022 FINDINGS: CT CHEST FINDINGS Cardiovascular: Stable cardiac pacer. Mitral valve prosthesis. No pericardial effusion. Normal caliber of the thoracic aorta. Stable atherosclerosis of the aorta and coronary vasculature. Mediastinum/Nodes: Thyroid, trachea, and esophagus are grossly unremarkable. 9 mm short axis pretracheal lymph node reference image 17/2, previously measuring approximately 12 mm. Evaluation of the mediastinum and hilar regions is limited due to the lack of intravenous contrast. Lungs/Pleura: Right upper lobe mass measures 6.9 x 5.0 cm reference image 15/2, previously measuring 6.4 x 4.6 cm, compatible with recurrent right upper lobe lung cancer. There is a large multiloculated right pleural effusion. Compressive atelectasis is seen within the right middle and right lower lobes, with minimal aeration superior segment right  lower lobe. There is increase in the size and number of left lung pulmonary nodules seen on prior study, consistent with progressive metastatic disease. Index nodule left upper lobe image 34/4 measures 12 x 8 mm, previously 6 x 5 mm. No left-sided effusion. No pneumothorax. Musculoskeletal: Stable right anterolateral second and third rib fractures. No new bony abnormalities. Reconstructed images demonstrate no additional findings. CT ABDOMEN PELVIS FINDINGS Hepatobiliary: Gallbladder sludge layering dependently within the gallbladder. No calcified gallstones or cholecystitis. Indeterminate 8 mm hypodensity inferior right lobe liver image 59/2, metastatic disease cannot be excluded. No biliary duct dilation. Pancreas: Unremarkable unenhanced appearance. Spleen: Unremarkable unenhanced appearance. Adrenals/Urinary Tract: No urinary tract calculi or obstructive uropathy. The adrenals and bladder are grossly unremarkable. Stomach/Bowel: No bowel obstruction or ileus. No bowel wall thickening or inflammatory change. Vascular/Lymphatic: Aortic atherosclerosis. No enlarged abdominal or pelvic lymph nodes. Reproductive: Prior hysterectomy.  No adnexal masses. Other: No free fluid or free intraperitoneal gas. No abdominal wall hernia. Musculoskeletal: No acute or destructive bony lesions. Reconstructed images demonstrate no additional findings. IMPRESSION: 1. Enlarging right upper lobe mass consistent with progressive lung cancer. 2. Large multiloculated right pleural effusion, increased in size since prior study. Significant compressive atelectasis within the right middle and right lower lobe, with only a small portion of normally aerated lung within the superior segment right lower lobe. 3. New and enlarging left lung pulmonary nodules consistent with progressive metastatic disease. 4. Indeterminate 8 mm hypodensity inferior right lobe liver, not well seen on previous exams. Metastatic disease cannot be excluded. 5.  Grossly stable borderline enlarged mediastinal adenopathy. 6. Gallbladder sludge. No evidence of cholelithiasis or cholecystitis. 7.  Aortic Atherosclerosis (ICD10-I70.0). Electronically Signed   By: Randa Ngo M.D.   On: 09/10/2022 17:50   US THORACENTESIS ASP PLEURAL SPACE W/IMG GUIDE  Result Date: 08/26/2022 INDICATION: 62 year old woman with history of non-small cell lung cancer and recurrent right pleural effusion returns to radiology for thoracentesis EXAM: ULTRASOUND GUIDED RIGHT THORACENTESIS MEDICATIONS: None. COMPLICATIONS: None immediate. PROCEDURE: An ultrasound guided thoracentesis was thoroughly discussed with the patient and questions answered. The benefits, risks, alternatives and complications were also discussed. The patient understands and wishes to proceed with the procedure. Written consent was obtained. Ultrasound was performed to localize and mark an adequate pocket of fluid in the right chest. The area was then prepped and draped in the normal sterile fashion. 1% Lidocaine was used for local anesthesia. Under ultrasound guidance a 6 Fr Safe-T-Centesis catheter was introduced. Thoracentesis was performed. The catheter was removed and a dressing applied. FINDINGS: A total of approximately 1.4 L of dark brown fluid was removed. IMPRESSION: Successful ultrasound guided right thoracentesis yielding 1.4 L of pleural fluid. Electronically Signed   By: Miachel Roux M.D.   On: 08/26/2022 16:26   DG Chest Port 1 View  Result Date: 08/26/2022 CLINICAL DATA:  Status post right thoracentesis EXAM: PORTABLE CHEST 1 VIEW COMPARISON:  Previous CT done on 05/21/2022, chest radiograph done on 07/08/2022 FINDINGS: There is moderate to large right pleural effusion with interval increase. There is no pneumothorax. There is homogeneous opacity in the medial right upper lung field consistent with neoplastic process seen in the previous PET-CT. There is interval decrease in volume in right lung.  Evaluation of right mid and right lower lung fields for new infiltrates is limited by the effusion. Left lung is clear. Pacemaker/defibrillator battery is seen in the left infraclavicular region. Right IJ chest port is seen with its tip in superior vena cava. There is evidence of previous surgery in right shoulder. IMPRESSION: There is no pneumothorax. Moderate to large right pleural effusion with interval increase in size since 07/08/2022. Other findings as described in the body of the report. Electronically Signed   By: Elmer Picker M.D.   On: 08/26/2022 16:19     Assessment and plan- Patient is a 62 y.o. 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  She is here for on treatment assessment prior to cycle 4 of palliative Keytruda  Patient's creatinine is elevated at 1.8 today as compared to her baseline of 1.  She is on Lasix 80 mg twice daily.  I am holding off on giving her any IV fluids but I will get in touch with cardiology to see if her Lasix dose needs to be decreased.  I am holding off on treatment today and I will plan to repeat CT chest abdomen pelvis without contrast.  If she has disease progression I will plan to add carboplatin and Alimta to her existing Keytruda regimen.  After the patient left the clinic her INR came back supratherapeutic given that she had hemoptysis as well we have asked her to go to the ER.   Visit Diagnosis 1. Elevated serum creatinine   2. Primary lung adenocarcinoma, right (HCC)      Dr. Owens Shark, MD, MPH [facility] at [facility] [phone] 09/18/2022 7:57 PM

## 2022-09-19 ENCOUNTER — Encounter: Payer: Self-pay | Admitting: Oncology

## 2022-09-20 ENCOUNTER — Encounter: Payer: Self-pay | Admitting: Oncology

## 2022-09-20 ENCOUNTER — Inpatient Hospital Stay (HOSPITAL_BASED_OUTPATIENT_CLINIC_OR_DEPARTMENT_OTHER): Payer: Medicare HMO | Admitting: Oncology

## 2022-09-20 ENCOUNTER — Inpatient Hospital Stay: Payer: Medicare HMO

## 2022-09-20 ENCOUNTER — Encounter: Payer: Self-pay | Admitting: Family Medicine

## 2022-09-20 ENCOUNTER — Other Ambulatory Visit: Payer: Self-pay

## 2022-09-20 ENCOUNTER — Telehealth: Payer: Self-pay | Admitting: *Deleted

## 2022-09-20 ENCOUNTER — Encounter: Payer: Self-pay | Admitting: Internal Medicine

## 2022-09-20 VITALS — BP 148/94 | HR 118 | Temp 99.7°F | Resp 17 | Wt 198.0 lb

## 2022-09-20 DIAGNOSIS — Z952 Presence of prosthetic heart valve: Secondary | ICD-10-CM

## 2022-09-20 DIAGNOSIS — C3431 Malignant neoplasm of lower lobe, right bronchus or lung: Secondary | ICD-10-CM | POA: Diagnosis not present

## 2022-09-20 DIAGNOSIS — I2510 Atherosclerotic heart disease of native coronary artery without angina pectoris: Secondary | ICD-10-CM | POA: Diagnosis not present

## 2022-09-20 DIAGNOSIS — E785 Hyperlipidemia, unspecified: Secondary | ICD-10-CM

## 2022-09-20 DIAGNOSIS — I10 Essential (primary) hypertension: Secondary | ICD-10-CM

## 2022-09-20 DIAGNOSIS — C3491 Malignant neoplasm of unspecified part of right bronchus or lung: Secondary | ICD-10-CM

## 2022-09-20 DIAGNOSIS — N179 Acute kidney failure, unspecified: Secondary | ICD-10-CM | POA: Diagnosis not present

## 2022-09-20 DIAGNOSIS — I252 Old myocardial infarction: Secondary | ICD-10-CM | POA: Diagnosis not present

## 2022-09-20 DIAGNOSIS — Z87891 Personal history of nicotine dependence: Secondary | ICD-10-CM | POA: Diagnosis not present

## 2022-09-20 DIAGNOSIS — Z7901 Long term (current) use of anticoagulants: Secondary | ICD-10-CM | POA: Diagnosis not present

## 2022-09-20 DIAGNOSIS — I5042 Chronic combined systolic (congestive) and diastolic (congestive) heart failure: Secondary | ICD-10-CM | POA: Diagnosis not present

## 2022-09-20 DIAGNOSIS — Z7982 Long term (current) use of aspirin: Secondary | ICD-10-CM | POA: Diagnosis not present

## 2022-09-20 DIAGNOSIS — I5022 Chronic systolic (congestive) heart failure: Secondary | ICD-10-CM

## 2022-09-20 DIAGNOSIS — I110 Hypertensive heart disease with heart failure: Secondary | ICD-10-CM | POA: Diagnosis not present

## 2022-09-20 DIAGNOSIS — J910 Malignant pleural effusion: Secondary | ICD-10-CM | POA: Diagnosis not present

## 2022-09-20 LAB — CBC WITH DIFFERENTIAL/PLATELET
Abs Immature Granulocytes: 0.18 10*3/uL — ABNORMAL HIGH (ref 0.00–0.07)
Basophils Absolute: 0.1 10*3/uL (ref 0.0–0.1)
Basophils Relative: 1 %
Eosinophils Absolute: 0.8 10*3/uL — ABNORMAL HIGH (ref 0.0–0.5)
Eosinophils Relative: 7 %
HCT: 38.4 % (ref 36.0–46.0)
Hemoglobin: 12.8 g/dL (ref 12.0–15.0)
Immature Granulocytes: 2 %
Lymphocytes Relative: 12 %
Lymphs Abs: 1.5 10*3/uL (ref 0.7–4.0)
MCH: 29 pg (ref 26.0–34.0)
MCHC: 33.3 g/dL (ref 30.0–36.0)
MCV: 86.9 fL (ref 80.0–100.0)
Monocytes Absolute: 1 10*3/uL (ref 0.1–1.0)
Monocytes Relative: 8 %
Neutro Abs: 8.5 10*3/uL — ABNORMAL HIGH (ref 1.7–7.7)
Neutrophils Relative %: 70 %
Platelets: 151 10*3/uL (ref 150–400)
RBC: 4.42 MIL/uL (ref 3.87–5.11)
RDW: 17.7 % — ABNORMAL HIGH (ref 11.5–15.5)
WBC: 12 10*3/uL — ABNORMAL HIGH (ref 4.0–10.5)
nRBC: 0 % (ref 0.0–0.2)

## 2022-09-20 LAB — COMPREHENSIVE METABOLIC PANEL
ALT: 16 U/L (ref 0–44)
AST: 57 U/L — ABNORMAL HIGH (ref 15–41)
Albumin: 3.8 g/dL (ref 3.5–5.0)
Alkaline Phosphatase: 249 U/L — ABNORMAL HIGH (ref 38–126)
Anion gap: 14 (ref 5–15)
BUN: 15 mg/dL (ref 8–23)
CO2: 24 mmol/L (ref 22–32)
Calcium: 8.9 mg/dL (ref 8.9–10.3)
Chloride: 98 mmol/L (ref 98–111)
Creatinine, Ser: 1.72 mg/dL — ABNORMAL HIGH (ref 0.44–1.00)
GFR, Estimated: 33 mL/min — ABNORMAL LOW (ref >60)
Glucose, Bld: 174 mg/dL — ABNORMAL HIGH (ref 70–99)
Potassium: 3.8 mmol/L (ref 3.5–5.1)
Sodium: 136 mmol/L (ref 135–145)
Total Bilirubin: 1.6 mg/dL — ABNORMAL HIGH (ref 0.3–1.2)
Total Protein: 8.2 g/dL — ABNORMAL HIGH (ref 6.5–8.1)

## 2022-09-20 LAB — TSH: TSH: 3.085 u[IU]/mL (ref 0.350–4.500)

## 2022-09-20 MED ORDER — PREDNISONE 10 MG PO TABS: 10.0000 mg | ORAL_TABLET | Freq: Every day | ORAL | 0 refills | Status: DC

## 2022-09-20 MED ORDER — SODIUM CHLORIDE 0.9 % IV SOLN
Freq: Once | INTRAVENOUS | Status: AC
  Filled 2022-09-20: qty 250

## 2022-09-20 NOTE — Progress Notes (Signed)
Patient here for oncology follow-up appointment, concerns of nausea, SOB and no appetite

## 2022-09-20 NOTE — Telephone Encounter (Addendum)
Received a fax for patient daughter Vikki Ports for FMLA. Form completed and sent for doctor signature

## 2022-09-20 NOTE — Progress Notes (Signed)
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  No evidence of nodal involvement.  She was not deemed to be a candidate for surgery and underwent SBRT to this lesion.  This was biopsy-proven adenocarcinoma with acinar and solid morphologies.  She then had a CT scan in January 2020 which showed enlarging satellite right upper lobe lung nodules.  This was followed by a PET CT scan which showed right upper lobe lung nodules 2 of them were hypermetabolic with an SUV of 8.7 was also a thyroid nodule approximately 4 mm in size which was not definitely hypermetabolic.  Patient was seen by Dr. Aggie Cosier following this and sh received SBRT for 5 fractions encompassing all these 3 nodules.  Patient has not had any systemic chemotherapy for her lung cancer so far.  There is a gradual increase in the size of her right  upper lobe lung nodules in May 2022 and patient received SBRT to this area again by Dr. Aggie Cosier   Scans up until May 2023 showed stable postradiation changes in the right upper lobe.  Mildly enlarged right paratracheal but stable as compared to 3 months prior.  No new findings of metastatic disease.  However CT chest in September 2023 showed considerable increase in the area ofRight upper lobe lung mass from pre-existing 5.5 cm which included area of radiation fibrosis to 6.4 cm.  Enlargement of the left upper lobe lung nodule from 0.4 to 0.6 cm.  New moderate right pleural effusion interval enlargement of the pretracheal lymph nodes to 1.2 cm   Patient underwent right-sided thoracentesis which was consistent with adenocarcinoma of lung origin.  PD-L1 was 40% and patient was started on single agent Keytruda.  Disease progression after 3 cycles  Interval history-patient has not had any hemoptysis since her hospitalization.  However overall patient continues to feel poorly.  She has ongoing fatigue and appetite is poor.  ECOG PS- 2 Pain scale- 0   Review of systems- Review of Systems  Constitutional:  Positive for malaise/fatigue. Negative for chills, fever and weight loss.       Lack of appetite  HENT:  Negative for congestion, ear discharge and nosebleeds.   Eyes:  Negative for blurred vision.  Respiratory:  Positive for shortness of breath. Negative for cough, hemoptysis, sputum production and wheezing.   Cardiovascular:  Negative for chest pain, palpitations, orthopnea and claudication.  Gastrointestinal:  Negative for abdominal pain, blood in stool, constipation, diarrhea, heartburn, melena, nausea and vomiting.  Genitourinary:  Negative for dysuria, flank pain, frequency, hematuria and urgency.  Musculoskeletal:  Negative for back pain, joint pain and myalgias.  Skin:  Negative for rash.  Neurological:  Negative for dizziness, tingling, focal weakness, seizures, weakness and headaches.   Endo/Heme/Allergies:  Does not bruise/bleed easily.  Psychiatric/Behavioral:  Negative for depression and suicidal ideas. The patient does not have insomnia.       No Known Allergies   Past Medical History:  Diagnosis Date   AICD (automatic cardioverter/defibrillator) present    Anterior myocardial infarction (HCC)    a. 10/2014 - occluded LAD, complicated by cardiogenic shock-->Med Rx as interventional team was unable to open LAD.   Arthritis    arthirits of knee on right   Cancer of upper lobe of right lung (HCC) 09/2017   radiation therapy right side   Cervical cancer (HCC)    in remission for ~20 yrs, s/p hysterectomy   Chronic combined systolic and diastolic CHF (congestive heart failure) (HCC)    a. 10/2015 Echo: EF 20-25%, sev diast dysfxn, mildly reduced RV fxn. Nl MV prosthesis; b. 12/2017 Echo: EF 20-25%, diff HK. Sev ant/antsept HK, apical AK. Mild MS (mean graad ). Mod TR. PASP .   Coronary artery disease    a. 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   Essential hypertension    Ischemic cardiomyopathy    a. 03/2015 s/p MDT single lead AICD;  b. 10/2015 Echo: EF 20-25%;  c. 12/2017 Echo: EF 20-25%, diff HK.   Mass of upper lobe of right lung    a. noted on CXR & CT 08/2017 w/ abnl PET CT.   Mitral valve disease    a. 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 12/2017 Echo: Mild MS. Mean gradient .   Personal history of radiation therapy    f/u lung ca     Past Surgical History:  Procedure Laterality Date   APPENDECTOMY  1998   BREAST EXCISIONAL BIOPSY Left 80s   benign   CARDIAC CATHETERIZATION     CARDIAC DEFIBRILLATOR PLACEMENT     COLONOSCOPY WITH PROPOFOL N/A 01/11/2020   Procedure: COLONOSCOPY WITH PROPOFOL;  Surgeon: Wyline Mood, MD;  Location: [facility] ENDOSCOPY;  Service: Gastroenterology;  Laterality: N/A;   IR IMAGING  GUIDED PORT INSERTION  06/29/2022   PORTA CATH INSERTION     RADICAL HYSTERECTOMY  1998   REPLACEMENT TOTAL KNEE Right 12/10/2021   SHOULDER SURGERY Bilateral    rotator cuff tears   VALVE REPLACEMENT     Mitral valve; 27 mm St. Jude bileaflet valve    Social History   Socioeconomic History   Marital status: Married    Spouse name: Tosin   Number of children: 2   Years of education: 14   Highest education level: Some college, no degree  Occupational History   Occupation: disable  Tobacco Use   Smoking status: Former    Packs/day: 1.00    Years: 4.00    Total pack years: 4.00    Types: Cigarettes    Quit date: 1998    Years since quitting: 26.0    Passive exposure: Past   Smokeless tobacco: Never  Vaping Use   Vaping Use: Never used  Substance and Sexual Activity   Alcohol use: No   Drug use: No   Sexual activity: Yes    Partners: Male    Birth control/protection: Surgical  Other Topics  Concern   Not on file  Social History Narrative   Not on file   Social Determinants of Health   Financial Resource Strain: Low Risk  (10/29/2021)   Overall Financial Resource Strain (CARDIA)    Difficulty of Paying Living Expenses: Not hard at all  Food Insecurity: No Food Insecurity (09/17/2022)   Hunger Vital Sign    Worried About Running Out of Food in the Last Year: Never true    Ran Out of Food in the Last Year: Never true  Transportation Needs: No Transportation Needs (09/11/2022)   PRAPARE - Administrator, Civil Service (Medical): No    Lack of Transportation (Non-Medical): No  Physical Activity: Insufficiently Active (10/29/2021)   Exercise Vital Sign    Days of Exercise per Week: 2 days    Minutes of Exercise per Session: 20 min  Stress: No Stress Concern Present (10/29/2021)   [employer] of Occupational Health - Occupational Stress Questionnaire    Feeling of Stress : Not at all  Social Connections: Moderately Integrated (10/29/2021)   Social Connection  and Isolation Panel [NHANES]    Frequency of Communication with Friends and Family: Three times a week    Frequency of Social Gatherings with Friends and Family: Once a week    Attends Religious Services: More than 4 times per year    Active Member of [employer] or Organizations: No    Attends Banker Meetings: Never    Marital Status: Married  Catering manager Violence: Not At Risk (09/11/2022)   Humiliation, Afraid, Rape, and Kick questionnaire    Fear of Current or Ex-Partner: No    Emotionally Abused: No    Physically Abused: No    Sexually Abused: No    Family History  Problem Relation Age of Onset   Heart attack Mother    Hypertension Mother    Diabetes Mother    Emphysema Father        smoker   Diabetes Maternal Grandmother    Kidney disease Maternal Grandmother    Breast cancer Maternal Aunt    Breast cancer Maternal Aunt    Colon cancer Maternal Uncle      Current Outpatient Medications:    aspirin 81 MG chewable tablet, Chew 81 mg by mouth daily., Disp: , Rfl:    atorvastatin (LIPITOR) 80 MG tablet, TAKE 1 TABLET BY MOUTH ONCE DAILY. OFFICE VISIT AND LABS NEEDED FOR FURTHER REFILLS., Disp: 90 tablet, Rfl: 3   ezetimibe (ZETIA) 10 MG tablet, Take 1 tablet (10 mg total) by mouth daily., Disp: 30 tablet, Rfl: 2   gabapentin (NEURONTIN) 300 MG capsule, Take 1 capsule (300 mg total) by mouth 2 (two) times daily., Disp: 180 capsule, Rfl: 3   metoprolol succinate (TOPROL-XL) 25 MG 24 hr tablet, TAKE 1 TABLET BY MOUTH TWICE DAILY IN THE MORNING AND AT BEDTIME, Disp: 180 tablet, Rfl: 0   midodrine (PROAMATINE) 10 MG tablet, Take 1 tablet (10 mg total) by mouth 3 (three) times daily with meals., Disp: 30 tablet, Rfl: 0   oxyCODONE (OXY IR/ROXICODONE) 5 MG immediate release tablet, Take 1 tablet (5 mg total) by mouth every 4 (four) hours as needed for severe pain., Disp: 30 tablet, Rfl: 0   potassium chloride SA (KLOR-CON M) 20 MEQ tablet, Take 2 tablets (40 mEq total) by  mouth daily., Disp: 60 tablet, Rfl: 6   predniSONE (DELTASONE) 10 MG tablet, Take 1 tablet (10 mg total) by mouth daily with breakfast., Disp: 5  tablet, Rfl: 0   warfarin (COUMADIN) 2 MG tablet, Take 1 tablet (2 mg total) by mouth daily at 4 PM., Disp: 30 tablet, Rfl: 0   metolazone (ZAROXOLYN) 2.5 MG tablet, Take 1 tablet (2.5 mg) by mouth once a week. Take 30 minutes prior to your furosemide dose. (Patient not taking: Reported on 09/10/2022), Disp: 12 tablet, Rfl: 0   oxybutynin (DITROPAN-XL) 10 MG 24 hr tablet, Take 1 tablet (10 mg total) by mouth daily. (Patient not taking: Reported on 09/10/2022), Disp: 30 tablet, Rfl: 11  Physical exam:  Vitals:   09/20/22 0947  BP: (!) 148/94  Pulse: (!) 118  Resp: 17  Temp: 99.7 F (37.6 C)  SpO2: 93%  Weight: 198 lb (89.8 kg)   Physical Exam Constitutional:      Comments: Sitting in a wheelchair.  Appears fatigued  Cardiovascular:     Rate and Rhythm: Regular rhythm. Tachycardia present.     Heart sounds: Normal heart sounds.  Pulmonary:     Effort: Pulmonary effort is normal.     Comments: Breath sounds decreased diffusely on the right side Abdominal:     General: Bowel sounds are normal.     Palpations: Abdomen is soft.  Musculoskeletal:     Cervical back: Normal range of motion.  Skin:    General: Skin is warm and dry.  Neurological:     Mental Status: She is alert and oriented to person, place, and time.         Latest Ref Rng & Units 09/20/2022    9:03 AM  CMP  Glucose 70 - 99 mg/dL 327   BUN 8 - 23 mg/dL 15   Creatinine 6.14 - 1.00 mg/dL 7.09   Sodium 295 - 747 mmol/L 136   Potassium 3.5 - 5.1 mmol/L 3.8   Chloride 98 - 111 mmol/L 98   CO2 22 - 32 mmol/L 24   Calcium 8.9 - 10.3 mg/dL 8.9   Total Protein 6.5 - 8.1 g/dL 8.2   Total Bilirubin 0.3 - 1.2 mg/dL 1.6   Alkaline Phos 38 - 126 U/L 249   AST 15 - 41 U/L 57   ALT 0 - 44 U/L 16       Latest Ref Rng & Units 09/20/2022    9:03 AM  CBC  WBC 4.0 - 10.5 K/uL 12.0    Hemoglobin 12.0 - 15.0 g/dL 34.0   Hematocrit 37.0 - 46.0 % 38.4   Platelets 150 - 400 K/uL 151     No images are attached to the encounter.  US THORACENTESIS ASP PLEURAL SPACE W/IMG GUIDE  Result Date: 09/13/2022 INDICATION: Patient with history of non-small cell lung cancer with recurrent right pleural effusion. Request received for therapeutic thoracentesis. EXAM: ULTRASOUND GUIDED THERAPEUTIC RIGHT THORACENTESIS MEDICATIONS: 10 mL 1 % lidocaine COMPLICATIONS: None immediate. PROCEDURE: An ultrasound guided thoracentesis was thoroughly discussed with the patient and questions answered. The benefits, risks, alternatives and complications were also discussed. The patient understands and wishes to proceed with the procedure. Written consent was obtained. Ultrasound was performed to localize and mark an adequate pocket of fluid in the right chest. The area was then prepped and draped in the normal sterile fashion. 1% Lidocaine was used for local anesthesia. Under ultrasound guidance a 6 Fr Safe-T-Centesis catheter was introduced. Thoracentesis was performed. The catheter was removed and a dressing applied. FINDINGS: A total of approximately 1.3 L of dark, bloody fluid was removed. IMPRESSION: Successful ultrasound guided right thoracentesis yielding 1.3 L  of pleural fluid. Read by: Alex Gardener, AGNP-BC Electronically Signed   By: Acquanetta Belling M.D.   On: 09/13/2022 14:39   DG Chest Port 1 View  Result Date: 09/13/2022 CLINICAL DATA:  Post thora EXAM: PORTABLE CHEST - 1 VIEW COMPARISON:  CT 09/10/2022 FINDINGS: No pneumothorax. Persistent moderate right pleural effusion, with atelectasis/consolidation at the right lung base. Left lung clear. Stable right IJ port catheter to the distal SVC. Stable left subclavian AICD. Heart size and mediastinal contours are within normal limits. Post MVR. Sternotomy wires. IMPRESSION: Persistent moderate right pleural effusion. No pneumothorax post thoracentesis.  Electronically Signed   By: Corlis Leak M.D.   On: 09/13/2022 14:11   ECHOCARDIOGRAM COMPLETE  Result Date: 09/11/2022    ECHOCARDIOGRAM REPORT   Patient Name:   Vanessa Carlson Date of Exam: 09/11/2022 Medical Rec #:  [id number]         Height:       62.0 in Accession #:    [id number]        Weight:       203.9 lb Date of Birth:  [birth year]          BSA:          1.928 m Patient Age:    61 years          BP:           93/62 mmHg Patient Gender: F                 HR:           83 bpm. Exam Location:  [facility] Procedure: 2D Echo and Intracardiac Opacification Agent Indications:     CHF I50.21  History:         Patient has prior history of Echocardiogram examinations, most                  recent 01/28/2022.                   Mitral Valve: 27 mm mechanical valve valve is present in the                  mitral position.  Sonographer:     Overton Mam RDCS Referring Phys:  [id number] Ronnald Ramp O'NEAL Diagnosing Phys: Lennie Odor MD  Sonographer Comments: Technically difficult study due to poor echo windows and suboptimal subcostal window. Image acquisition challenging due to patient body habitus. IMPRESSIONS  1. Left ventricular ejection fraction, by estimation, is 20 to 25%. The left ventricle has severely decreased function. The left ventricle demonstrates regional wall motion abnormalities (see scoring diagram/findings for description). Left ventricular diastolic function could not be evaluated.  2. Right ventricular systolic function is normal. The right ventricular size is normal. There is normal pulmonary artery systolic pressure. The estimated right ventricular systolic pressure is 31.7 mmHg.  3. The mitral valve has been repaired/replaced. No evidence of mitral valve regurgitation. The mean mitral valve gradient is 2.0 mmHg with average heart rate of 85 bpm. There is a 27 mm mechanical valve present in the mitral position. Echo findings are consistent with normal structure and function of the mitral valve  prosthesis.  4. The aortic valve is tricuspid. Aortic valve regurgitation is not visualized. No aortic stenosis is present.  5. The inferior vena cava is normal in size with greater than 50% respiratory variability, suggesting right atrial pressure of 3 mmHg. Comparison(s): No significant change from prior study. Conclusion(s)/Recommendation(s): Findings consistent with  ischemic cardiomyopathy. No left ventricular mural or apical thrombus/thrombi. FINDINGS  Left Ventricle: Left ventricular ejection fraction, by estimation, is 20 to 25%. The left ventricle has severely decreased function. The left ventricle demonstrates regional wall motion abnormalities. Definity contrast agent was given IV to delineate the left ventricular endocardial borders. The left ventricular internal cavity size was normal in size. There is no left ventricular hypertrophy. Left ventricular diastolic function could not be evaluated due to mitral valve replacement. Left ventricular  diastolic function could not be evaluated.  LV Wall Scoring: The mid and distal anterior septum, apical lateral segment, apical anterior segment, and apex are akinetic. The anterior wall, antero-lateral wall, entire inferior wall, posterior wall, basal anteroseptal segment, mid inferoseptal segment, and basal inferoseptal segment are hypokinetic. Right Ventricle: The right ventricular size is normal. No increase in right ventricular wall thickness. Right ventricular systolic function is normal. There is normal pulmonary artery systolic pressure. The tricuspid regurgitant velocity is 2.68 m/s, and  with an assumed right atrial pressure of 3 mmHg, the estimated right ventricular systolic pressure is 31.7 mmHg. Left Atrium: Left atrial size was normal in size. Right Atrium: Right atrial size was normal in size. Pericardium: There is no evidence of pericardial effusion. Mitral Valve: The mitral valve has been repaired/replaced. No evidence of mitral valve regurgitation.  There is a 27 mm mechanical valve present in the mitral position. Echo findings are consistent with normal structure and function of the mitral valve prosthesis. MV peak gradient, 4.8 mmHg. The mean mitral valve gradient is 2.0 mmHg with average heart rate of 85 bpm. Tricuspid Valve: The tricuspid valve is grossly normal. Tricuspid valve regurgitation is mild . No evidence of tricuspid stenosis. Aortic Valve: The aortic valve is tricuspid. Aortic valve regurgitation is not visualized. No aortic stenosis is present. Aortic valve peak gradient measures 6.2 mmHg. Pulmonic Valve: The pulmonic valve was grossly normal. Pulmonic valve regurgitation is mild. No evidence of pulmonic stenosis. Aorta: The aortic root and ascending aorta are structurally normal, with no evidence of dilitation. Venous: The inferior vena cava is normal in size with greater than 50% respiratory variability, suggesting right atrial pressure of 3 mmHg. IAS/Shunts: The atrial septum is grossly normal. Additional Comments: A device lead is visualized in the right atrium and right ventricle.  LEFT VENTRICLE PLAX 2D LVIDd:         5.20 cm     Diastology LVIDs:         4.70 cm     LV e' medial:    6.53 cm/s LV PW:         0.80 cm     LV E/e' medial:  11.6 LV IVS:        0.80 cm     LV e' lateral:   8.16 cm/s LVOT diam:     1.65 cm     LV E/e' lateral: 9.3 LV SV:         28 LV SV Index:   14 LVOT Area:     2.14 cm  LV Volumes (MOD) LV vol d, MOD A2C: 98.8 ml LV vol d, MOD A4C: 94.4 ml LV vol s, MOD A2C: 49.1 ml LV vol s, MOD A4C: 63.6 ml LV SV MOD A2C:     49.8 ml LV SV MOD A4C:     94.4 ml LV SV MOD BP:      42.9 ml RIGHT VENTRICLE RV Basal diam:  2.20 cm LEFT ATRIUM  Index LA diam:        3.50 cm 1.82 cm/m LA Vol (A2C):   82.2 ml 42.64 ml/m LA Vol (A4C):   30.2 ml 15.67 ml/m LA Biplane Vol: 51.7 ml 26.82 ml/m  AORTIC VALVE                 PULMONIC VALVE AV Area (Vmax): 1.28 cm     PV Vmax:          0.90 m/s AV Vmax:        124.00 cm/s   PV Peak grad:     3.2 mmHg AV Peak Grad:   6.2 mmHg     PR End Diast Vel: 5.20 msec LVOT Vmax:      74.20 cm/s LVOT Vmean:     46.800 cm/s LVOT VTI:       0.130 m  AORTA Ao Root diam: 2.70 cm Ao Asc diam:  2.40 cm MITRAL VALVE                TRICUSPID VALVE MV Area (PHT): 3.27 cm     TR Peak grad:   28.7 mmHg MV Area VTI:   1.27 cm     TR Vmax:        268.00 cm/s MV Peak grad:  4.8 mmHg MV Mean grad:  2.0 mmHg     SHUNTS MV Vmax:       1.10 m/s     Systemic VTI:  0.13 m MV Vmean:      55.7 cm/s    Systemic Diam: 1.65 cm MV Decel Time: 232 msec MV E velocity: 75.50 cm/s MV A velocity: 112.00 cm/s MV E/A ratio:  0.67 Lennie Odor MD Electronically signed by Lennie Odor MD Signature Date/Time: 09/11/2022/5:40:25 PM    Final    CT CHEST ABDOMEN PELVIS WO CONTRAST  Result Date: 09/10/2022 CLINICAL DATA:  Non-small cell lung cancer, elevated INR EXAM: CT CHEST, ABDOMEN AND PELVIS WITHOUT CONTRAST TECHNIQUE: Multidetector CT imaging of the chest, abdomen and pelvis was performed following the standard protocol without IV contrast. Examination was performed without intravenous contrast at the request of the ordering physician. Evaluation of the soft tissues, vascular structures, and solid viscera is limited. RADIATION DOSE REDUCTION: This exam was performed according to the departmental dose-optimization program which includes automated exposure control, adjustment of the mA and/or kV according to patient size and/or use of iterative reconstruction technique. COMPARISON:  05/21/2022, 06/10/2022 FINDINGS: CT CHEST FINDINGS Cardiovascular: Stable cardiac pacer. Mitral valve prosthesis. No pericardial effusion. Normal caliber of the thoracic aorta. Stable atherosclerosis of the aorta and coronary vasculature. Mediastinum/Nodes: Thyroid, trachea, and esophagus are grossly unremarkable. 9 mm short axis pretracheal lymph node reference image 17/2, previously measuring approximately 12 mm. Evaluation of the mediastinum and  hilar regions is limited due to the lack of intravenous contrast. Lungs/Pleura: Right upper lobe mass measures 6.9 x 5.0 cm reference image 15/2, previously measuring 6.4 x 4.6 cm, compatible with recurrent right upper lobe lung cancer. There is a large multiloculated right pleural effusion. Compressive atelectasis is seen within the right middle and right lower lobes, with minimal aeration superior segment right lower lobe. There is increase in the size and number of left lung pulmonary nodules seen on prior study, consistent with progressive metastatic disease. Index nodule left upper lobe image 34/4 measures 12 x 8 mm, previously 6 x 5 mm. No left-sided effusion. No pneumothorax. Musculoskeletal: Stable right anterolateral second and third rib fractures. No new bony abnormalities. Reconstructed  images demonstrate no additional findings. CT ABDOMEN PELVIS FINDINGS Hepatobiliary: Gallbladder sludge layering dependently within the gallbladder. No calcified gallstones or cholecystitis. Indeterminate 8 mm hypodensity inferior right lobe liver image 59/2, metastatic disease cannot be excluded. No biliary duct dilation. Pancreas: Unremarkable unenhanced appearance. Spleen: Unremarkable unenhanced appearance. Adrenals/Urinary Tract: No urinary tract calculi or obstructive uropathy. The adrenals and bladder are grossly unremarkable. Stomach/Bowel: No bowel obstruction or ileus. No bowel wall thickening or inflammatory change. Vascular/Lymphatic: Aortic atherosclerosis. No enlarged abdominal or pelvic lymph nodes. Reproductive: Prior hysterectomy.  No adnexal masses. Other: No free fluid or free intraperitoneal gas. No abdominal wall hernia. Musculoskeletal: No acute or destructive bony lesions. Reconstructed images demonstrate no additional findings. IMPRESSION: 1. Enlarging right upper lobe mass consistent with progressive lung cancer. 2. Large multiloculated right pleural effusion, increased in size since prior study.  Significant compressive atelectasis within the right middle and right lower lobe, with only a small portion of normally aerated lung within the superior segment right lower lobe. 3. New and enlarging left lung pulmonary nodules consistent with progressive metastatic disease. 4. Indeterminate 8 mm hypodensity inferior right lobe liver, not well seen on previous exams. Metastatic disease cannot be excluded. 5. Grossly stable borderline enlarged mediastinal adenopathy. 6. Gallbladder sludge. No evidence of cholelithiasis or cholecystitis. 7.  Aortic Atherosclerosis (ICD10-I70.0). Electronically Signed   By: Randa Ngo M.D.   On: 09/10/2022 17:50   US THORACENTESIS ASP PLEURAL SPACE W/IMG GUIDE  Result Date: 08/26/2022 INDICATION: 62 year old woman with history of non-small cell lung cancer and recurrent right pleural effusion returns to radiology for thoracentesis EXAM: ULTRASOUND GUIDED RIGHT THORACENTESIS MEDICATIONS: None. COMPLICATIONS: None immediate. PROCEDURE: An ultrasound guided thoracentesis was thoroughly discussed with the patient and questions answered. The benefits, risks, alternatives and complications were also discussed. The patient understands and wishes to proceed with the procedure. Written consent was obtained. Ultrasound was performed to localize and mark an adequate pocket of fluid in the right chest. The area was then prepped and draped in the normal sterile fashion. 1% Lidocaine was used for local anesthesia. Under ultrasound guidance a 6 Fr Safe-T-Centesis catheter was introduced. Thoracentesis was performed. The catheter was removed and a dressing applied. FINDINGS: A total of approximately 1.4 L of dark brown fluid was removed. IMPRESSION: Successful ultrasound guided right thoracentesis yielding 1.4 L of pleural fluid. Electronically Signed   By: Miachel Roux M.D.   On: 08/26/2022 16:26   DG Chest Port 1 View  Result Date: 08/26/2022 CLINICAL DATA:  Status post right  thoracentesis EXAM: PORTABLE CHEST 1 VIEW COMPARISON:  Previous CT done on 05/21/2022, chest radiograph done on 07/08/2022 FINDINGS: There is moderate to large right pleural effusion with interval increase. There is no pneumothorax. There is homogeneous opacity in the medial right upper lung field consistent with neoplastic process seen in the previous PET-CT. There is interval decrease in volume in right lung. Evaluation of right mid and right lower lung fields for new infiltrates is limited by the effusion. Left lung is clear. Pacemaker/defibrillator battery is seen in the left infraclavicular region. Right IJ chest port is seen with its tip in superior vena cava. There is evidence of previous surgery in right shoulder. IMPRESSION: There is no pneumothorax. Moderate to large right pleural effusion with interval increase in size since 07/08/2022. Other findings as described in the body of the report. Electronically Signed   By: Elmer Picker M.D.   On: 08/26/2022 16:19     Assessment and plan- Patient is a  62 y.o. 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  She is here for on treatment assessment prior to cycle 1 of carbo Alimta Keytruda chemotherapy  Patient was recently discharged from the hospital for supratherapeutic INR and AKI.  She still feels quite fatigued and not up to receiving treatment today.  I have reviewed CT chest abdomen and pelvis images independently and discussed findings with the patient which shows disease progression with worsening right upper lobe lung mass, right pleural effusion as well as enlarging left lung nodules.  I plan was therefore to start her on [place] Alimta Keytruda chemotherapy today but I will defer her treatment by 1 week.  Her kidney numbers are worse again with a creatinine of 1.7.  I have asked her daughter to get in touch with cardiology about Lasix dosing.  Carboplatin can  also further worsen her kidney functions thinking of reducing the dose of carboplatin to AUC 4 when I use it next week.   Visit Diagnosis 1. Primary lung adenocarcinoma, right (HCC)   2. AKI (acute kidney injury) (HCC)      Dr. Owens Shark, MD, MPH [facility] [phone] 09/20/2022 1:11 PM

## 2022-09-21 ENCOUNTER — Encounter: Payer: Self-pay | Admitting: Oncology

## 2022-09-21 ENCOUNTER — Ambulatory Visit
Admission: RE | Admit: 2022-09-21 | Discharge: 2022-09-21 | Disposition: A | Discharge status: Home / Self Care | Payer: Medicare HMO | Source: Ambulatory Visit | Attending: Oncology | Admitting: Oncology

## 2022-09-21 LAB — T4: T4, Total: 9.4 ug/dL (ref 4.5–12.0)

## 2022-09-21 NOTE — Addendum Note (Signed)
Addended by: Henreitta Leber on: 09/21/2022 11:27 AM   Modules accepted: Orders

## 2022-09-21 NOTE — Telephone Encounter (Signed)
Fprm copied and I called [employer] to let them know that form is ready to be picked up, I had to leave a message on voice mail regarding this

## 2022-09-22 ENCOUNTER — Ambulatory Visit (INDEPENDENT_AMBULATORY_CARE_PROVIDER_SITE_OTHER): Payer: Medicare HMO

## 2022-09-22 DIAGNOSIS — I480 Paroxysmal atrial fibrillation: Secondary | ICD-10-CM

## 2022-09-22 DIAGNOSIS — Z952 Presence of prosthetic heart valve: Secondary | ICD-10-CM | POA: Diagnosis not present

## 2022-09-22 LAB — POCT INR
INR: 8 — AB (ref 2.0–3.0)
POC INR: 8
PT: 96

## 2022-09-22 NOTE — Patient Instructions (Signed)
Description   Hold coumadin 1 week. Fu 1 wk.

## 2022-09-24 ENCOUNTER — Encounter: Payer: Self-pay | Admitting: Oncology

## 2022-09-24 ENCOUNTER — Other Ambulatory Visit: Payer: Self-pay | Admitting: *Deleted

## 2022-09-24 DIAGNOSIS — C3411 Malignant neoplasm of upper lobe, right bronchus or lung: Secondary | ICD-10-CM

## 2022-09-24 MED FILL — Dexamethasone Sodium Phosphate Inj 100 MG/10ML: INTRAMUSCULAR | Qty: 1 | Status: AC

## 2022-09-24 MED FILL — Fosaprepitant Dimeglumine For IV Infusion 150 MG (Base Eq): INTRAVENOUS | Qty: 5 | Status: AC

## 2022-09-27 ENCOUNTER — Other Ambulatory Visit: Payer: Self-pay | Admitting: *Deleted

## 2022-09-27 ENCOUNTER — Inpatient Hospital Stay: Payer: Medicare HMO

## 2022-09-27 ENCOUNTER — Encounter: Payer: Self-pay | Admitting: Oncology

## 2022-09-27 ENCOUNTER — Ambulatory Visit: Payer: Medicare HMO | Admitting: Oncology

## 2022-09-27 ENCOUNTER — Other Ambulatory Visit: Payer: Medicare HMO

## 2022-09-27 ENCOUNTER — Ambulatory Visit: Payer: Medicare HMO

## 2022-09-27 ENCOUNTER — Inpatient Hospital Stay (HOSPITAL_BASED_OUTPATIENT_CLINIC_OR_DEPARTMENT_OTHER): Payer: Medicare HMO | Admitting: Oncology

## 2022-09-27 VITALS — BP 135/84 | HR 114 | Resp 18

## 2022-09-27 DIAGNOSIS — K5909 Other constipation: Secondary | ICD-10-CM | POA: Diagnosis not present

## 2022-09-27 DIAGNOSIS — C3491 Malignant neoplasm of unspecified part of right bronchus or lung: Secondary | ICD-10-CM | POA: Diagnosis not present

## 2022-09-27 DIAGNOSIS — R0902 Hypoxemia: Secondary | ICD-10-CM | POA: Diagnosis not present

## 2022-09-27 DIAGNOSIS — R000 Tachycardia, unspecified: Secondary | ICD-10-CM | POA: Diagnosis not present

## 2022-09-27 DIAGNOSIS — R112 Nausea with vomiting, unspecified: Secondary | ICD-10-CM

## 2022-09-27 DIAGNOSIS — I5042 Chronic combined systolic (congestive) and diastolic (congestive) heart failure: Secondary | ICD-10-CM | POA: Diagnosis not present

## 2022-09-27 DIAGNOSIS — I110 Hypertensive heart disease with heart failure: Secondary | ICD-10-CM | POA: Diagnosis not present

## 2022-09-27 DIAGNOSIS — Z7189 Other specified counseling: Secondary | ICD-10-CM | POA: Diagnosis not present

## 2022-09-27 DIAGNOSIS — I2510 Atherosclerotic heart disease of native coronary artery without angina pectoris: Secondary | ICD-10-CM | POA: Diagnosis not present

## 2022-09-27 DIAGNOSIS — Z7982 Long term (current) use of aspirin: Secondary | ICD-10-CM | POA: Diagnosis not present

## 2022-09-27 DIAGNOSIS — I252 Old myocardial infarction: Secondary | ICD-10-CM | POA: Diagnosis not present

## 2022-09-27 DIAGNOSIS — M549 Dorsalgia, unspecified: Secondary | ICD-10-CM | POA: Diagnosis not present

## 2022-09-27 DIAGNOSIS — R52 Pain, unspecified: Secondary | ICD-10-CM | POA: Diagnosis not present

## 2022-09-27 DIAGNOSIS — Z7901 Long term (current) use of anticoagulants: Secondary | ICD-10-CM | POA: Diagnosis not present

## 2022-09-27 DIAGNOSIS — I10 Essential (primary) hypertension: Secondary | ICD-10-CM | POA: Diagnosis not present

## 2022-09-27 DIAGNOSIS — Z87891 Personal history of nicotine dependence: Secondary | ICD-10-CM | POA: Diagnosis not present

## 2022-09-27 DIAGNOSIS — Z9221 Personal history of antineoplastic chemotherapy: Secondary | ICD-10-CM | POA: Diagnosis not present

## 2022-09-27 DIAGNOSIS — C3431 Malignant neoplasm of lower lobe, right bronchus or lung: Secondary | ICD-10-CM | POA: Diagnosis not present

## 2022-09-27 DIAGNOSIS — J910 Malignant pleural effusion: Secondary | ICD-10-CM | POA: Diagnosis not present

## 2022-09-27 LAB — COMPREHENSIVE METABOLIC PANEL
ALT: 23 U/L (ref 0–44)
AST: 60 U/L — ABNORMAL HIGH (ref 15–41)
Albumin: 3.9 g/dL (ref 3.5–5.0)
Alkaline Phosphatase: 279 U/L — ABNORMAL HIGH (ref 38–126)
Anion gap: 13 (ref 5–15)
BUN: 29 mg/dL — ABNORMAL HIGH (ref 8–23)
CO2: 24 mmol/L (ref 22–32)
Calcium: 9 mg/dL (ref 8.9–10.3)
Chloride: 98 mmol/L (ref 98–111)
Creatinine, Ser: 1.3 mg/dL — ABNORMAL HIGH (ref 0.44–1.00)
GFR, Estimated: 46 mL/min — ABNORMAL LOW (ref >60)
Glucose, Bld: 174 mg/dL — ABNORMAL HIGH (ref 70–99)
Potassium: 3.4 mmol/L — ABNORMAL LOW (ref 3.5–5.1)
Sodium: 135 mmol/L (ref 135–145)
Total Bilirubin: 1.8 mg/dL — ABNORMAL HIGH (ref 0.3–1.2)
Total Protein: 8.4 g/dL — ABNORMAL HIGH (ref 6.5–8.1)

## 2022-09-27 LAB — CBC WITH DIFFERENTIAL/PLATELET
Abs Immature Granulocytes: 0.35 10*3/uL — ABNORMAL HIGH (ref 0.00–0.07)
Basophils Absolute: 0.1 10*3/uL (ref 0.0–0.1)
Basophils Relative: 1 %
Eosinophils Absolute: 1.3 10*3/uL — ABNORMAL HIGH (ref 0.0–0.5)
Eosinophils Relative: 8 %
HCT: 40.7 % (ref 36.0–46.0)
Hemoglobin: 13.1 g/dL (ref 12.0–15.0)
Immature Granulocytes: 2 %
Lymphocytes Relative: 6 %
Lymphs Abs: 0.9 10*3/uL (ref 0.7–4.0)
MCH: 28.8 pg (ref 26.0–34.0)
MCHC: 32.2 g/dL (ref 30.0–36.0)
MCV: 89.5 fL (ref 80.0–100.0)
Monocytes Absolute: 1.1 10*3/uL — ABNORMAL HIGH (ref 0.1–1.0)
Monocytes Relative: 7 %
Neutro Abs: 11.4 10*3/uL — ABNORMAL HIGH (ref 1.7–7.7)
Neutrophils Relative %: 76 %
Platelets: 226 10*3/uL (ref 150–400)
RBC: 4.55 MIL/uL (ref 3.87–5.11)
RDW: 17.8 % — ABNORMAL HIGH (ref 11.5–15.5)
WBC: 15.1 10*3/uL — ABNORMAL HIGH (ref 4.0–10.5)
nRBC: 0.1 % (ref 0.0–0.2)

## 2022-09-27 LAB — TSH: TSH: 1.937 u[IU]/mL (ref 0.350–4.500)

## 2022-09-27 NOTE — Progress Notes (Signed)
Hematology/Oncology Consult note [facility]  Telephone:([phone] [phone]  Patient Care Team: Virginia Crews, MD as PCP - General (Family Medicine) End, Harrell Gave, MD as PCP - Cardiology (Cardiology) Sindy Guadeloupe, MD as Consulting Physician (Oncology) Sindy Guadeloupe, MD as Consulting Physician (Oncology) Noreene Filbert, MD as Consulting Physician (Radiation Oncology) Lovell Sheehan, MD as Consulting Physician (Orthopedic Surgery)   Name of the patient: Vanessa Carlson  [id number]  June 28, 1961   Date of visit: 09/27/22  Diagnosis- history of 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  No evidence of nodal involvement.  She was not deemed to be a candidate for surgery and underwent SBRT to this lesion.  This was biopsy-proven adenocarcinoma with acinar and solid morphologies.  She then had a CT scan in January 2020 which showed enlarging satellite right upper lobe lung nodules.  This was followed by a PET CT scan which showed right upper lobe lung nodules 2 of them were hypermetabolic with an SUV of 8.7 was also a thyroid nodule approximately 4 mm in size which was not definitely hypermetabolic.  Patient was seen by Dr. Donella Stade following this and sh received SBRT for 5 fractions encompassing all these 3 nodules.  Patient has not had any systemic chemotherapy for her lung cancer so far.  There is a gradual increase in the size of her right  upper lobe lung nodules in May 2022 and patient received SBRT to this area again by Dr. Donella Stade   Scans up until May 2023 showed stable postradiation changes in the right upper lobe.  Mildly enlarged right paratracheal but stable as compared to 3 months prior.  No new findings of metastatic disease.  However CT chest in September 2023 showed considerable increase in the area ofRight upper lobe lung mass from pre-existing 5.5 cm which included area of radiation fibrosis to 6.4 cm.  Enlargement of the left upper lobe lung nodule from 0.4 to 0.6 cm.  New moderate right pleural effusion interval enlargement of the pretracheal lymph nodes to 1.2 cm   Patient underwent right-sided thoracentesis which was consistent with adenocarcinoma of lung origin.  PD-L1 was 40% and patient was started on single agent Keytruda.  Disease progression after 3 cycles    Interval history-patient continues to do poorly.  She is complaining of significant nausea.  Appetite is poor.  She is also constipated and has not had a bowel movement for a week.  She does not feel after receiving treatment today.  Chest wall pain is currently well-controlled and she is only using oxycodone 1 or 2 times a day.  ECOG PS- 2 Pain scale- 2 Opioid associated constipation- no  Review of systems- Review of Systems  Constitutional:  Positive for malaise/fatigue. Negative for chills, fever and weight loss.  HENT:  Negative for congestion, ear discharge and nosebleeds.   Eyes:  Negative for blurred vision.  Respiratory:  Negative for cough, hemoptysis, sputum production, shortness of breath and wheezing.   Cardiovascular:  Negative for chest pain, palpitations, orthopnea and claudication.  Gastrointestinal:  Positive for constipation and nausea. Negative for abdominal pain, blood in stool, diarrhea, heartburn, melena and vomiting.  Genitourinary:  Negative for dysuria, flank pain, frequency, hematuria and urgency.  Musculoskeletal:  Negative  for back pain, joint pain and myalgias.  Skin:  Negative for rash.  Neurological:  Negative for dizziness, tingling, focal weakness, seizures, weakness and headaches.  Endo/Heme/Allergies:  Does not bruise/bleed easily.  Psychiatric/Behavioral:  Negative for depression and suicidal ideas. The patient does not have insomnia.       No Known Allergies   Past Medical History:  Diagnosis Date   AICD (automatic cardioverter/defibrillator) present    Anterior myocardial infarction ([place])    a. 10/2014 - occluded LAD, complicated by cardiogenic shock-->Med Rx as interventional team was unable to open LAD.   Arthritis    arthirits of knee on right   Cancer of upper lobe of right lung ([place]) 09/2017   radiation therapy right side   Cervical cancer ([facility])    in remission for ~20 yrs, s/p hysterectomy   Chronic combined systolic and diastolic CHF (congestive heart failure) ([place])    a. 10/2015 Echo: EF 20-25%, sev diast dysfxn, mildly reduced RV fxn. Nl MV prosthesis; b. 12/2017 Echo: EF 20-25%, diff HK. Sev ant/antsept HK, apical AK. Mild MS (mean graad 38mmHg). Mod TR. PASP 57mmHg.   Coronary artery disease    a. 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   Essential hypertension    Ischemic cardiomyopathy    a. 03/2015 s/p MDT single lead AICD;  b. 10/2015 Echo: EF 20-25%;  c. 12/2017 Echo: EF 20-25%, diff HK.   Mass of upper lobe of right lung    a. noted on CXR & CT 08/2017 w/ abnl PET CT.   Mitral valve disease    a. 2006 s/p MVR w/ 93mm SJM bileaflet mechanical valve-->chronic coumadin/ASA; b. 12/2017 Echo: Mild MS. Mean gradient 64mmHg.   Personal history of radiation therapy    f/u lung ca     Past Surgical History:  Procedure Laterality Date   APPENDECTOMY  1998   BREAST EXCISIONAL BIOPSY Left 80s   benign   CARDIAC CATHETERIZATION     CARDIAC DEFIBRILLATOR PLACEMENT     COLONOSCOPY WITH PROPOFOL N/A  01/11/2020   Procedure: COLONOSCOPY WITH PROPOFOL;  Surgeon: Jonathon Bellows, MD;  Location: [facility] ENDOSCOPY;  Service: Gastroenterology;  Laterality: N/A;   IR IMAGING GUIDED PORT INSERTION  06/29/2022   PORTA CATH INSERTION     RADICAL HYSTERECTOMY  1998   REPLACEMENT TOTAL KNEE Right 12/10/2021   SHOULDER SURGERY Bilateral    rotator cuff tears   VALVE REPLACEMENT     Mitral valve; 27 mm [employer] [employer] bileaflet valve    Social History   Socioeconomic History   Marital status: Married    Spouse name: Tosin   Number of children: 2   Years of education: 14   Highest education level: Some college, no degree  Occupational History   Occupation: disable  Tobacco Use   Smoking status: Former    Packs/day: 1.00    Years: 4.00    Total pack years: 4.00    Types: Cigarettes    Quit date: 1998    Years since quitting: 26.0    Passive exposure: Past   Smokeless tobacco: Never  Vaping Use   Vaping Use: Never used  Substance and  Sexual Activity   Alcohol use: No   Drug use: No   Sexual activity: Yes    Partners: Male    Birth control/protection: Surgical  Other Topics Concern   Not on file  Social History Narrative   Not on file   Social Determinants of Health   Financial Resource Strain: Low Risk  (10/29/2021)   Overall Financial Resource Strain (CARDIA)    Difficulty of Paying Living Expenses: Not hard at all  Food Insecurity: No Food Insecurity (09/17/2022)   Hunger Vital Sign    Worried About Running Out of Food in the Last Year: Never true    Ran Out of Food in the Last Year: Never true  Transportation Needs: No Transportation Needs (09/11/2022)   PRAPARE - Hydrologist (Medical): No    Lack of Transportation (Non-Medical): No  Physical Activity: Insufficiently Active (10/29/2021)   Exercise Vital Sign    Days of Exercise per Week: 2 days    Minutes of Exercise per Session: 20 min  Stress: No Stress Concern Present (10/29/2021)   [place]    Feeling of Stress : Not at all  Social Connections: Moderately Integrated (10/29/2021)   Social Connection and Isolation Panel [NHANES]    Frequency of Communication with Friends and Family: Three times a week    Frequency of Social Gatherings with Friends and Family: Once a week    Attends Religious Services: More than 4 times per year    Active Member of [employer] or Organizations: No    Attends Archivist Meetings: Never    Marital Status: Married  Human resources officer Violence: Not At Risk (09/11/2022)   Humiliation, Afraid, Rape, and Kick questionnaire    Fear of Current or Ex-Partner: No    Emotionally Abused: No    Physically Abused: No    Sexually Abused: No    Family History  Problem Relation Age of Onset   Heart attack Mother    Hypertension Mother    Diabetes Mother    Emphysema Father        smoker   Diabetes Maternal Grandmother    Kidney disease Maternal Grandmother    Breast cancer Maternal Aunt    Breast cancer Maternal Aunt    Colon cancer Maternal Uncle      Current Outpatient Medications:    aspirin 81 MG chewable tablet, Chew 81 mg by mouth daily., Disp: , Rfl:    atorvastatin (LIPITOR) 80 MG tablet, TAKE 1 TABLET BY MOUTH ONCE DAILY. OFFICE VISIT AND LABS NEEDED FOR FURTHER REFILLS., Disp: 90 tablet, Rfl: 3   docusate sodium (COLACE) 250 MG capsule, Take 250 mg by mouth daily., Disp: , Rfl:    ezetimibe (ZETIA) 10 MG tablet, Take 1 tablet (10 mg total) by mouth daily., Disp: 30 tablet, Rfl: 2   gabapentin (NEURONTIN) 300 MG capsule, Take 1 capsule (300 mg total) by mouth 2 (two) times daily., Disp: 180 capsule, Rfl: 3   metoprolol succinate (TOPROL-XL) 25 MG 24 hr tablet, TAKE 1 TABLET BY MOUTH TWICE DAILY IN THE MORNING AND AT BEDTIME, Disp: 180 tablet, Rfl: 0   midodrine (PROAMATINE) 10 MG tablet, Take 1 tablet (10 mg total) by mouth 3 (three) times daily with meals., Disp: 30 tablet, Rfl: 0    oxyCODONE (OXY IR/ROXICODONE) 5 MG immediate release tablet, Take 1 tablet (5 mg total) by mouth every 4 (four) hours as needed for severe pain.,  Disp: 30 tablet, Rfl: 0   potassium chloride SA (KLOR-CON M) 20 MEQ tablet, Take 2 tablets (40 mEq total) by mouth daily., Disp: 60 tablet, Rfl: 6   predniSONE (DELTASONE) 10 MG tablet, Take 1 tablet (10 mg total) by mouth daily with breakfast., Disp: 5 tablet, Rfl: 0   prochlorperazine (COMPAZINE) 10 MG tablet, Take 10 mg by mouth every 6 (six) hours as needed for nausea or vomiting., Disp: , Rfl:    warfarin (COUMADIN) 2 MG tablet, Take 1 tablet (2 mg total) by mouth daily at 4 PM., Disp: 30 tablet, Rfl: 0   metolazone (ZAROXOLYN) 2.5 MG tablet, Take 1 tablet (2.5 mg) by mouth once a week. Take 30 minutes prior to your furosemide dose. (Patient not taking: Reported on 09/10/2022), Disp: 12 tablet, Rfl: 0   oxybutynin (DITROPAN-XL) 10 MG 24 hr tablet, Take 1 tablet (10 mg total) by mouth daily. (Patient not taking: Reported on 09/10/2022), Disp: 30 tablet, Rfl: 11  Physical exam:  Vitals:   09/27/22 1115  BP: 135/84  Pulse: (!) 114  Resp: 18   Physical Exam Constitutional:      Comments: Appears fatigued.  Sitting in a wheelchair  Cardiovascular:     Rate and Rhythm: Normal rate and regular rhythm.     Heart sounds: Normal heart sounds.  Pulmonary:     Effort: Pulmonary effort is normal.     Comments: Breath sounds decreased over right lung base Abdominal:     General: Bowel sounds are normal.     Palpations: Abdomen is soft.  Skin:    General: Skin is warm and dry.  Neurological:     Mental Status: She is alert and oriented to person, place, and time.         Latest Ref Rng & Units 09/27/2022   10:02 AM  CMP  Glucose 70 - 99 mg/dL 174   BUN 8 - 23 mg/dL 29   Creatinine 0.44 - 1.00 mg/dL 1.30   Sodium 135 - 145 mmol/L 135   Potassium 3.5 - 5.1 mmol/L 3.4   Chloride 98 - 111 mmol/L 98   CO2 22 - 32 mmol/L 24   Calcium 8.9 - 10.3  mg/dL 9.0   Total Protein 6.5 - 8.1 g/dL 8.4   Total Bilirubin 0.3 - 1.2 mg/dL 1.8   Alkaline Phos 38 - 126 U/L 279   AST 15 - 41 U/L 60   ALT 0 - 44 U/L 23       Latest Ref Rng & Units 09/27/2022   10:02 AM  CBC  WBC 4.0 - 10.5 K/uL 15.1   Hemoglobin 12.0 - 15.0 g/dL 13.1   Hematocrit 36.0 - 46.0 % 40.7   Platelets 150 - 400 K/uL 226     No images are attached to the encounter.  US THORACENTESIS ASP PLEURAL SPACE W/IMG GUIDE  Result Date: 09/13/2022 INDICATION: Patient with history of non-small cell lung cancer with recurrent right pleural effusion. Request received for therapeutic thoracentesis. EXAM: ULTRASOUND GUIDED THERAPEUTIC RIGHT THORACENTESIS MEDICATIONS: 10 mL 1 % lidocaine COMPLICATIONS: None immediate. PROCEDURE: An ultrasound guided thoracentesis was thoroughly discussed with the patient and questions answered. The benefits, risks, alternatives and complications were also discussed. The patient understands and wishes to proceed with the procedure. Written consent was obtained. Ultrasound was performed to localize and mark an adequate pocket of fluid in the right chest. The area was then prepped and draped in the normal sterile fashion. 1% Lidocaine was used for  local anesthesia. Under ultrasound guidance a 6 Fr Safe-T-Centesis catheter was introduced. Thoracentesis was performed. The catheter was removed and a dressing applied. FINDINGS: A total of approximately 1.3 L of dark, bloody fluid was removed. IMPRESSION: Successful ultrasound guided right thoracentesis yielding 1.3 L of pleural fluid. Read by: Narda Rutherford, AGNP-BC Electronically Signed   By: Miachel Roux M.D.   On: 09/13/2022 14:39   DG Chest Port 1 View  Result Date: 09/13/2022 CLINICAL DATA:  Post thora EXAM: PORTABLE CHEST - 1 VIEW COMPARISON:  CT 09/10/2022 FINDINGS: No pneumothorax. Persistent moderate right pleural effusion, with atelectasis/consolidation at the right lung base. Left lung clear. Stable right IJ  port catheter to the distal SVC. Stable left subclavian AICD. Heart size and mediastinal contours are within normal limits. Post MVR. Sternotomy wires. IMPRESSION: Persistent moderate right pleural effusion. No pneumothorax post thoracentesis. Electronically Signed   By: Lucrezia Europe M.D.   On: 09/13/2022 14:11   ECHOCARDIOGRAM COMPLETE  Result Date: 09/11/2022    ECHOCARDIOGRAM REPORT   Patient Name:   SAIDI SANTACROCE Corsello Date of Exam: 09/11/2022 Medical Rec #:  [id number]         Height:       62.0 in Accession #:    [id number]        Weight:       203.9 lb Date of Birth:  Mar 21, 1961          BSA:          1.928 m Patient Age:    44 years          BP:           93/62 mmHg Patient Gender: F                 HR:           83 bpm. Exam Location:  [facility] Procedure: 2D Echo and Intracardiac Opacification Agent Indications:     CHF I50.21  History:         Patient has prior history of Echocardiogram examinations, most                  recent 01/28/2022.                   Mitral Valve: 27 mm mechanical valve valve is present in the                  mitral position.  Sonographer:     Kathlen Brunswick RDCS Referring Phys:  [id number] [place] Diagnosing Phys: Eleonore Chiquito MD  Sonographer Comments: Technically difficult study due to poor echo windows and suboptimal subcostal window. Image acquisition challenging due to patient body habitus. IMPRESSIONS  1. Left ventricular ejection fraction, by estimation, is 20 to 25%. The left ventricle has severely decreased function. The left ventricle demonstrates regional wall motion abnormalities (see scoring diagram/findings for description). Left ventricular diastolic function could not be evaluated.  2. Right ventricular systolic function is normal. The right ventricular size is normal. There is normal pulmonary artery systolic pressure. The estimated right ventricular systolic pressure is 78.4 mmHg.  3. The mitral valve has been repaired/replaced. No evidence of mitral valve  regurgitation. The mean mitral valve gradient is 2.0 mmHg with average heart rate of 85 bpm. There is a 27 mm mechanical valve present in the mitral position. Echo findings are consistent with normal structure and function of the mitral valve prosthesis.  4. The aortic valve is tricuspid.  Aortic valve regurgitation is not visualized. No aortic stenosis is present.  5. The inferior vena cava is normal in size with greater than 50% respiratory variability, suggesting right atrial pressure of 3 mmHg. Comparison(s): No significant change from prior study. Conclusion(s)/Recommendation(s): Findings consistent with ischemic cardiomyopathy. No left ventricular mural or apical thrombus/thrombi. FINDINGS  Left Ventricle: Left ventricular ejection fraction, by estimation, is 20 to 25%. The left ventricle has severely decreased function. The left ventricle demonstrates regional wall motion abnormalities. Definity contrast agent was given IV to delineate the left ventricular endocardial borders. The left ventricular internal cavity size was normal in size. There is no left ventricular hypertrophy. Left ventricular diastolic function could not be evaluated due to mitral valve replacement. Left ventricular  diastolic function could not be evaluated.  LV Wall Scoring: The mid and distal anterior septum, apical lateral segment, apical anterior segment, and apex are akinetic. The anterior wall, antero-lateral wall, entire inferior wall, posterior wall, basal anteroseptal segment, mid inferoseptal segment, and basal inferoseptal segment are hypokinetic. Right Ventricle: The right ventricular size is normal. No increase in right ventricular wall thickness. Right ventricular systolic function is normal. There is normal pulmonary artery systolic pressure. The tricuspid regurgitant velocity is 2.68 m/s, and  with an assumed right atrial pressure of 3 mmHg, the estimated right ventricular systolic pressure is 83.1 mmHg. Left Atrium: Left  atrial size was normal in size. Right Atrium: Right atrial size was normal in size. Pericardium: There is no evidence of pericardial effusion. Mitral Valve: The mitral valve has been repaired/replaced. No evidence of mitral valve regurgitation. There is a 27 mm mechanical valve present in the mitral position. Echo findings are consistent with normal structure and function of the mitral valve prosthesis. MV peak gradient, 4.8 mmHg. The mean mitral valve gradient is 2.0 mmHg with average heart rate of 85 bpm. Tricuspid Valve: The tricuspid valve is grossly normal. Tricuspid valve regurgitation is mild . No evidence of tricuspid stenosis. Aortic Valve: The aortic valve is tricuspid. Aortic valve regurgitation is not visualized. No aortic stenosis is present. Aortic valve peak gradient measures 6.2 mmHg. Pulmonic Valve: The pulmonic valve was grossly normal. Pulmonic valve regurgitation is mild. No evidence of pulmonic stenosis. Aorta: The aortic root and ascending aorta are structurally normal, with no evidence of dilitation. Venous: The inferior vena cava is normal in size with greater than 50% respiratory variability, suggesting right atrial pressure of 3 mmHg. IAS/Shunts: The atrial septum is grossly normal. Additional Comments: A device lead is visualized in the right atrium and right ventricle.  LEFT VENTRICLE PLAX 2D LVIDd:         5.20 cm     Diastology LVIDs:         4.70 cm     LV e' medial:    6.53 cm/s LV PW:         0.80 cm     LV E/e' medial:  11.6 LV IVS:        0.80 cm     LV e' lateral:   8.16 cm/s LVOT diam:     1.65 cm     LV E/e' lateral: 9.3 LV SV:         28 LV SV Index:   14 LVOT Area:     2.14 cm  LV Volumes (MOD) LV vol d, MOD A2C: 98.8 ml LV vol d, MOD A4C: 94.4 ml LV vol s, MOD A2C: 49.1 ml LV vol s, MOD A4C: 63.6 ml LV SV  MOD A2C:     49.8 ml LV SV MOD A4C:     94.4 ml LV SV MOD BP:      42.9 ml RIGHT VENTRICLE RV Basal diam:  2.20 cm LEFT ATRIUM             Index LA diam:        3.50 cm  1.82 cm/m LA Vol (A2C):   82.2 ml 42.64 ml/m LA Vol (A4C):   30.2 ml 15.67 ml/m LA Biplane Vol: 51.7 ml 26.82 ml/m  AORTIC VALVE                 PULMONIC VALVE AV Area (Vmax): 1.28 cm     PV Vmax:          0.90 m/s AV Vmax:        124.00 cm/s  PV Peak grad:     3.2 mmHg AV Peak Grad:   6.2 mmHg     PR End Diast Vel: 5.20 msec LVOT Vmax:      74.20 cm/s LVOT Vmean:     46.800 cm/s LVOT VTI:       0.130 m  AORTA Ao Root diam: 2.70 cm Ao Asc diam:  2.40 cm MITRAL VALVE                TRICUSPID VALVE MV Area (PHT): 3.27 cm     TR Peak grad:   28.7 mmHg MV Area VTI:   1.27 cm     TR Vmax:        268.00 cm/s MV Peak grad:  4.8 mmHg MV Mean grad:  2.0 mmHg     SHUNTS MV Vmax:       1.10 m/s     Systemic VTI:  0.13 m MV Vmean:      55.7 cm/s    Systemic Diam: 1.65 cm MV Decel Time: 232 msec MV E velocity: 75.50 cm/s MV A velocity: 112.00 cm/s MV E/A ratio:  0.67 Eleonore Chiquito MD Electronically signed by Eleonore Chiquito MD Signature Date/Time: 09/11/2022/5:40:25 PM    Final    CT CHEST ABDOMEN PELVIS WO CONTRAST  Result Date: 09/10/2022 CLINICAL DATA:  Non-small cell lung cancer, elevated INR EXAM: CT CHEST, ABDOMEN AND PELVIS WITHOUT CONTRAST TECHNIQUE: Multidetector CT imaging of the chest, abdomen and pelvis was performed following the standard protocol without IV contrast. Examination was performed without intravenous contrast at the request of the ordering physician. Evaluation of the soft tissues, vascular structures, and solid viscera is limited. RADIATION DOSE REDUCTION: This exam was performed according to the departmental dose-optimization program which includes automated exposure control, adjustment of the mA and/or kV according to patient size and/or use of iterative reconstruction technique. COMPARISON:  05/21/2022, 06/10/2022 FINDINGS: CT CHEST FINDINGS Cardiovascular: Stable cardiac pacer. Mitral valve prosthesis. No pericardial effusion. Normal caliber of the thoracic aorta. Stable atherosclerosis of  the aorta and coronary vasculature. Mediastinum/Nodes: Thyroid, trachea, and esophagus are grossly unremarkable. 9 mm short axis pretracheal lymph node reference image 17/2, previously measuring approximately 12 mm. Evaluation of the mediastinum and hilar regions is limited due to the lack of intravenous contrast. Lungs/Pleura: Right upper lobe mass measures 6.9 x 5.0 cm reference image 15/2, previously measuring 6.4 x 4.6 cm, compatible with recurrent right upper lobe lung cancer. There is a large multiloculated right pleural effusion. Compressive atelectasis is seen within the right middle and right lower lobes, with minimal aeration superior segment right lower lobe. There is increase in the size and  number of left lung pulmonary nodules seen on prior study, consistent with progressive metastatic disease. Index nodule left upper lobe image 34/4 measures 12 x 8 mm, previously 6 x 5 mm. No left-sided effusion. No pneumothorax. Musculoskeletal: Stable right anterolateral second and third rib fractures. No new bony abnormalities. Reconstructed images demonstrate no additional findings. CT ABDOMEN PELVIS FINDINGS Hepatobiliary: Gallbladder sludge layering dependently within the gallbladder. No calcified gallstones or cholecystitis. Indeterminate 8 mm hypodensity inferior right lobe liver image 59/2, metastatic disease cannot be excluded. No biliary duct dilation. Pancreas: Unremarkable unenhanced appearance. Spleen: Unremarkable unenhanced appearance. Adrenals/Urinary Tract: No urinary tract calculi or obstructive uropathy. The adrenals and bladder are grossly unremarkable. Stomach/Bowel: No bowel obstruction or ileus. No bowel wall thickening or inflammatory change. Vascular/Lymphatic: Aortic atherosclerosis. No enlarged abdominal or pelvic lymph nodes. Reproductive: Prior hysterectomy.  No adnexal masses. Other: No free fluid or free intraperitoneal gas. No abdominal wall hernia. Musculoskeletal: No acute or  destructive bony lesions. Reconstructed images demonstrate no additional findings. IMPRESSION: 1. Enlarging right upper lobe mass consistent with progressive lung cancer. 2. Large multiloculated right pleural effusion, increased in size since prior study. Significant compressive atelectasis within the right middle and right lower lobe, with only a small portion of normally aerated lung within the superior segment right lower lobe. 3. New and enlarging left lung pulmonary nodules consistent with progressive metastatic disease. 4. Indeterminate 8 mm hypodensity inferior right lobe liver, not well seen on previous exams. Metastatic disease cannot be excluded. 5. Grossly stable borderline enlarged mediastinal adenopathy. 6. Gallbladder sludge. No evidence of cholelithiasis or cholecystitis. 7.  Aortic Atherosclerosis (ICD10-I70.0). Electronically Signed   By: Randa Ngo M.D.   On: 09/10/2022 17:50     Assessment and plan- Patient is a 62 y.o. 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  She is here for on treatment assessment prior to cycle 1 of carbo Alimta Keytruda chemotherapy  Patient has not received treatment for the last 5 weeks.  She is continuing to do poorly with uncontrolled nausea and constipation over the last 1 week.  I am starting as needed Zofran ODT as well as as needed Ativan for her nausea.  Have advised her to take MiraLAX and senna for her constipation.  I am deferring chemo by 1 week but I will also have her see NP Altha Harm for a virtual visit later this week to see if she still wants to pursue active treatment versus proceeding with best supportive care.  I do not think that patient's side effects are secondary to [facility] which she has received over 5 weeks ago.  She likely has ongoing systemic effects from her underlying lung cancer and unlikely to get better without treatment.  However patient is  frail and could have side effects from chemotherapy as well.  Patient would like to think about her options and tentatively I am scheduling her for chemotherapy next week  AKI: Improved today and she is presently on Lasix 40 mg daily   Visit Diagnosis 1. Primary lung adenocarcinoma, right ([place])   2. Nausea and vomiting, unspecified vomiting type   3. Other constipation   4. Goals of care, counseling/discussion      Dr. Randa Evens, MD, MPH [facility] [phone] 09/27/2022 1:28 PM

## 2022-09-27 NOTE — Progress Notes (Signed)
Nausea and vomiting, states that meds only help sometimes, no appetite.Patient is having stomach pain 8/10.  She also reports constipation.

## 2022-09-28 LAB — T4: T4, Total: 9 ug/dL (ref 4.5–12.0)

## 2022-09-28 MED ORDER — ONDANSETRON HCL 4 MG PO TABS: 4.0000 mg | ORAL_TABLET | Freq: Three times a day (TID) | ORAL | 0 refills | Status: DC | PRN

## 2022-09-28 MED ORDER — LORAZEPAM 0.5 MG PO TABS: 0.5000 mg | ORAL_TABLET | Freq: Four times a day (QID) | ORAL | 0 refills | Status: DC | PRN

## 2022-09-29 ENCOUNTER — Ambulatory Visit (INDEPENDENT_AMBULATORY_CARE_PROVIDER_SITE_OTHER): Payer: Medicare HMO

## 2022-09-29 DIAGNOSIS — Z952 Presence of prosthetic heart valve: Secondary | ICD-10-CM | POA: Diagnosis not present

## 2022-09-29 DIAGNOSIS — I480 Paroxysmal atrial fibrillation: Secondary | ICD-10-CM | POA: Diagnosis not present

## 2022-09-29 LAB — POCT INR: INR: 6.8 — AB (ref 2.0–3.0)

## 2022-09-29 NOTE — Progress Notes (Signed)
No ICM remote transmission received for 09/27/2022 and next ICM transmission scheduled for 10/11/2022.

## 2022-09-29 NOTE — Patient Instructions (Signed)
Description   Hold coumadin 1 week. Fu 1 wk.

## 2022-09-30 ENCOUNTER — Inpatient Hospital Stay: Payer: Medicare HMO | Attending: Oncology | Admitting: Hospice and Palliative Medicine

## 2022-09-30 DIAGNOSIS — R110 Nausea: Secondary | ICD-10-CM

## 2022-09-30 DIAGNOSIS — Z515 Encounter for palliative care: Secondary | ICD-10-CM

## 2022-09-30 DIAGNOSIS — C3491 Malignant neoplasm of unspecified part of right bronchus or lung: Secondary | ICD-10-CM | POA: Diagnosis not present

## 2022-09-30 MED ORDER — OLANZAPINE 5 MG PO TABS: 5.0000 mg | ORAL_TABLET | Freq: Every day | ORAL | 0 refills | Status: DC

## 2022-09-30 NOTE — Progress Notes (Signed)
I,Sulibeya S Dimas,acting as a Education administrator for Lavon Paganini, MD.,have documented all relevant documentation on the behalf of Lavon Paganini, MD,as directed by  Lavon Paganini, MD while in the presence of Lavon Paganini, MD.     Established patient visit   Patient: Vanessa Carlson   DOB: Mar 10, 1961   62 y.o. Female  MRN: [id number] Visit Date: 10/01/2022  Today's healthcare provider: Lavon Paganini, MD   Chief Complaint  Patient presents with   Hospitalization Follow-up   Subjective    HPI  Follow up ER visit  Patient was seen in ER for generalized pain on 09/27/2022. Treatment for this included none. She reports excellent compliance with treatment. She reports this condition is Unchanged.   She has been unable to pick up Rx from [facility] as it was sent to [facility]. Does not have any pain meds now.  Also wants HH to help with ADLs, med management, etc and keep her at her own home longer. -----------------------------------------------------------------------------------------   Medications: Outpatient Medications Prior to Visit  Medication Sig   aspirin 81 MG chewable tablet Chew 81 mg by mouth daily.   atorvastatin (LIPITOR) 80 MG tablet TAKE 1 TABLET BY MOUTH ONCE DAILY. OFFICE VISIT AND LABS NEEDED FOR FURTHER REFILLS.   docusate sodium (COLACE) 250 MG capsule Take 250 mg by mouth daily.   ezetimibe (ZETIA) 10 MG tablet Take 1 tablet (10 mg total) by mouth daily.   gabapentin (NEURONTIN) 300 MG capsule Take 1 capsule (300 mg total) by mouth 2 (two) times daily.   LORazepam (ATIVAN) 0.5 MG tablet Take 1 tablet (0.5 mg total) by mouth every 6 (six) hours as needed (nausea).   metolazone (ZAROXOLYN) 2.5 MG tablet Take 1 tablet (2.5 mg) by mouth once a week. Take 30 minutes prior to your furosemide dose.   metoprolol succinate (TOPROL-XL) 25 MG 24 hr tablet TAKE 1 TABLET BY MOUTH TWICE DAILY IN THE MORNING AND AT BEDTIME   midodrine (PROAMATINE) 10 MG tablet Take 1  tablet (10 mg total) by mouth 3 (three) times daily with meals.   OLANZapine (ZYPREXA) 5 MG tablet Take 1 tablet (5 mg total) by mouth at bedtime.   ondansetron (ZOFRAN) 4 MG tablet Take 1 tablet (4 mg total) by mouth every 8 (eight) hours as needed for nausea or vomiting.   oxybutynin (DITROPAN-XL) 10 MG 24 hr tablet Take 1 tablet (10 mg total) by mouth daily.   potassium chloride SA (KLOR-CON M) 20 MEQ tablet Take 2 tablets (40 mEq total) by mouth daily.   predniSONE (DELTASONE) 10 MG tablet Take 1 tablet (10 mg total) by mouth daily with breakfast.   warfarin (COUMADIN) 2 MG tablet Take 1 tablet (2 mg total) by mouth daily at 4 PM.   [DISCONTINUED] oxyCODONE (OXY IR/ROXICODONE) 5 MG immediate release tablet Take 1 tablet (5 mg total) by mouth every 4 (four) hours as needed for severe pain.   No facility-administered medications prior to visit.    Review of Systems  Constitutional:  Positive for activity change, appetite change and fatigue. Negative for chills and fever.  Respiratory:  Positive for shortness of breath. Negative for chest tightness.   Cardiovascular:  Negative for chest pain and leg swelling.  Gastrointestinal:  Positive for abdominal pain, nausea and vomiting.  Musculoskeletal:  Positive for myalgias.       Objective    BP 130/80 (BP Location: Right Arm, Patient Position: Sitting, Cuff Size: Large)   Pulse (!) 117   Temp 97.6 F (  36.4 C) (Temporal)   Resp 16   LMP  (LMP Unknown)   SpO2 96%    Physical Exam Vitals reviewed.  Constitutional:      Appearance: She is well-developed. She is ill-appearing.     Comments: Sitting in wheelchair  HENT:     Head: Normocephalic and atraumatic.  Eyes:     General: No scleral icterus.    Conjunctiva/sclera: Conjunctivae normal.  Pulmonary:     Effort: Pulmonary effort is normal. No respiratory distress.  Skin:    General: Skin is warm and dry.     Findings: No rash.  Neurological:     Mental Status: She is alert.        No results found for any visits on 10/01/22.  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[facility])   Relevant Orders   Ambulatory referral to [place]   Amb Referral to Palliative Care   AMB Referral to Chronic Care Management Services   Other Visit Diagnoses     Generalized pain       Relevant Orders   Ambulatory referral to [place]   Amb Referral to Palliative Care   AMB Referral to Chronic Care Management Services   Generalized weakness       Relevant Orders   Ambulatory referral to [place]   Amb Referral to Palliative Care   AMB Referral to Chronic Care Management Services      - Rx provided today for Oxycodone - referral to home health and palliative care, as well as CCM for resources placed today - upcoming f/u with oncology to discuss ongoing treatments  Return if symptoms worsen or fail to improve.      I, Lavon Paganini, MD, have reviewed all documentation for this visit. The documentation on 10/01/22 for the exam, diagnosis, procedures, and orders are all accurate and complete.   Annalisa Colonna, Dionne Bucy, MD, MPH [facility]

## 2022-09-30 NOTE — Progress Notes (Signed)
Virtual Visit via Video Note  I connected with Vanessa Carlson on 09/30/22 at  1:30 PM EST by a video enabled telemedicine application and verified that I am speaking with the correct person using two identifiers.  Location: Patient: Home Provider: Clinic   I discussed the limitations of evaluation and management by telemedicine and the availability of in person appointments. The patient expressed understanding and agreed to proceed.  History of Present Illness: Vanessa Carlson is a 62 year old woman with multiple medical problems including history of stage I lung cancer status post XRT, now with stage IV adenocarcinoma of the lung with malignant pleural effusion.  Patient was started on single agent Keytruda with last treatment in December 2023.   Observations/Objective: Patient has been off treatment since December 2023 due to progressive decline with weakness, poor oral intake, and nausea.  Symptoms were felt likely to be caused by her underlying cancer but unclear if patient could tolerate further treatment versus focusing on best supportive care/hospice.  Today, I spoke with patient and daughter virtually.  Daughter says that patient is declining more rapidly and requiring assistance from family for her daily care needs.  Patient is eating minimally.  She feels like the nausea may be slightly better but still has nausea when she attempts to eat.  She is fatigued and mostly sedentary on the couch.   We discussed her overall goals at length.  Patient stated that she feels like she is leaning more towards just focusing on comfort at home.  She feels like she would benefit from more help at home such as that provided through hospice.  However, daughter confirmed that they were leaning more towards a supportive care approach but had not fully decided on that plan.  They have family coming in over the weekend and want to talk together in addition to praying about how to proceed.  They would like  to keep their scheduled appointment for next week with Dr. Janese Banks and feel that they will have fully decided on a plan by that time.  Assessment and Plan: Stage IV lung cancer -patient leaning towards best supportive care/hospice but not fully decided.  They would like to keep their appointment next week with Dr. Janese Banks and still that they will likely have an answer by then on how they wish to proceed.  Nausea -DC prochlorperazine.  Start olanzapine 5 mg nightly as needed.  May continue ondansetron as needed.  Follow Up Instructions: Next week as scheduled   I discussed the assessment and treatment plan with the patient. The patient was provided an opportunity to ask questions and all were answered. The patient agreed with the plan and demonstrated an understanding of the instructions.   The patient was advised to call back or seek an in-person evaluation if the symptoms worsen or if the condition fails to improve as anticipated.  I provided 15 minutes of non-face-to-face time during this encounter.   Irean Hong, NP

## 2022-10-01 ENCOUNTER — Other Ambulatory Visit: Payer: Self-pay | Admitting: Oncology

## 2022-10-01 ENCOUNTER — Encounter: Payer: Self-pay | Admitting: Family Medicine

## 2022-10-01 ENCOUNTER — Telehealth: Payer: Self-pay

## 2022-10-01 ENCOUNTER — Ambulatory Visit (INDEPENDENT_AMBULATORY_CARE_PROVIDER_SITE_OTHER): Payer: Medicare HMO | Admitting: Family Medicine

## 2022-10-01 VITALS — BP 130/80 | HR 117 | Temp 97.6°F | Resp 16

## 2022-10-01 DIAGNOSIS — G6282 Radiation-induced polyneuropathy: Secondary | ICD-10-CM | POA: Diagnosis not present

## 2022-10-01 DIAGNOSIS — R52 Pain, unspecified: Secondary | ICD-10-CM

## 2022-10-01 DIAGNOSIS — C3490 Malignant neoplasm of unspecified part of unspecified bronchus or lung: Secondary | ICD-10-CM | POA: Diagnosis not present

## 2022-10-01 DIAGNOSIS — R531 Weakness: Secondary | ICD-10-CM

## 2022-10-01 MED ORDER — OXYCODONE HCL 5 MG PO TABS: 5.0000 mg | ORAL_TABLET | ORAL | 0 refills | Status: DC | PRN

## 2022-10-01 NOTE — Progress Notes (Signed)
Care Management & Coordination Services Pharmacy Team  Reason for Encounter: Appointment Reminder  Contacted patient to confirm telephone appointment with Daron Offer, PharmD on 10/04/2022 at 11:00 am.  Unsuccessful outreach. Left voicemail for patient to return call.   Chart review:  Recent office visits:  08/09/2022 Dr. Brita Romp MD (PCP) Medicare wellness completed, No medication changes noted, return in 6 month 04/23/2022 Mikey Kirschner PA-C (PCP Office) start triamcinolone cream (KENALOG) 0.1 %   Recent consult visits:  09/30/2022 Altha Harm NP (Oncology) Start olanzapine  5 MG tablet daily  09/27/2022  Dr. Janese Banks MD (Oncology) No medication changes noted 09/20/2022 Dr. Janese Banks MD (Oncology) No medication changes noted 09/10/2022 Dr. Janese Banks MD (Oncology) No Medication changes noted 08/31/2022 Altha Harm NP (Oncology) Discontinue tramadol  08/20/2022 Dr. Janese Banks MD (Oncology) No medication changes noted 08/16/2022 Dr. Janese Banks MD (Oncology) start amoxicillin-clavulanate 604-540 MG tablet 07/30/2022 Nelwyn Salisbury PA-C (Emergency medicine) No medication changes noted 07/19/2022 Virl Axe (Cardiology) Unable to see note 07/12/2022 Dr. Mickeal Skinner MD (Oncology) No Medication changes noted 07/07/2022 Dr. Saunders Revel MD (Cardiology)Stop metolazone,Return to clinic in 3 months  07/06/2022 Virl Axe (Cardiology) Unable to see note 06/25/2022 Dr. Janese Banks (Oncology) No Medication changes noted 06/21/2022 Dr. Matilde Sprang MD (urology) start oxybutynin 10 MG daily, 06/14/2022 Sharman Cheek Rn (Cardiology)No Medication changes noted, follow up 3 month 06/08/2022 Dr. Janese Banks (Oncology) No Medication changes noted 05/25/2022 Dr. Janese Banks (Oncology) No Medication changes noted 05/20/2022 Dr. Saunders Revel MD (Cardiology) Start metolazone 2.5 mg once a week , Start potassium chloride SA  20 MEQ tablet, Return to clinic in 2 to 3 weeks  05/04/2022 Sharman Cheek RN (Cardiology) No Medication changes noted 05/04/2022 Virl Axe  (Cardiology) Unable to see note 04/26/2022 Dr. Matilde Sprang MD (urology) Stop Trazodone 50 mg  04/06/2022 Virl Axe (Cardiology) Unable to see note  Hospital visits:  Medication Reconciliation was completed by comparing discharge summary, patient's EMR and Pharmacy list, and upon discussion with patient.  Admitted to the hospital on 09/27/2022 due to Generalized pain. Discharge date was 09/28/2022. Discharged from [place]?Medications Started at [facility] Discharge:?? -started Oxycodone 40 mg PRN  Medication Changes at Hospital Discharge: -Changed None ID  Medications Discontinued at Hospital Discharge: -Stopped None ID  Medications that remain the same after Hospital Discharge:??  -All other medications will remain the same.     Admitted to the hospital on 09/10/2022 due to Supratherapeutic INR. Discharge date was 09/16/2022. Discharged from [place]?Medications Started at [facility] Discharge:?? -started Kcentra -started vitamin K 10 mg IV    Medication Changes at Hospital Discharge: -Changed None ID  Medications Discontinued at Hospital Discharge: -Stopped Phytonadione 2.5 mg   Medications that remain the same after Hospital Discharge:??  -All other medications will remain the same.     Star Rating Drugs:  Medication: Atorvastatin 80 mg Last Fill: 08/31/2022 90 Day Supply at [facility] [employer].   Care Gaps: Annual wellness visit in last year? Yes,last completed on 10/29/2021 Dtap Vaccine Shingrix vaccine COVID-Vaccine  Bessie [place] Pharmacist Assistant [phone]

## 2022-10-04 ENCOUNTER — Inpatient Hospital Stay: Payer: Medicare HMO

## 2022-10-04 ENCOUNTER — Encounter: Payer: Self-pay | Admitting: Internal Medicine

## 2022-10-04 ENCOUNTER — Ambulatory Visit: Payer: Medicare HMO | Admitting: Medical

## 2022-10-04 ENCOUNTER — Emergency Department: Payer: Medicare HMO

## 2022-10-04 ENCOUNTER — Other Ambulatory Visit: Payer: Self-pay

## 2022-10-04 ENCOUNTER — Inpatient Hospital Stay
Admission: EM | Admit: 2022-10-04 | Discharge: 2022-10-04 | DRG: 871 | Disposition: A | Discharge status: Hospice | Payer: Medicare HMO | Attending: Internal Medicine | Admitting: Internal Medicine

## 2022-10-04 DIAGNOSIS — J9601 Acute respiratory failure with hypoxia: Secondary | ICD-10-CM | POA: Diagnosis not present

## 2022-10-04 DIAGNOSIS — Z833 Family history of diabetes mellitus: Secondary | ICD-10-CM

## 2022-10-04 DIAGNOSIS — G9341 Metabolic encephalopathy: Secondary | ICD-10-CM | POA: Diagnosis not present

## 2022-10-04 DIAGNOSIS — C3490 Malignant neoplasm of unspecified part of unspecified bronchus or lung: Secondary | ICD-10-CM | POA: Diagnosis not present

## 2022-10-04 DIAGNOSIS — Z7982 Long term (current) use of aspirin: Secondary | ICD-10-CM

## 2022-10-04 DIAGNOSIS — C7931 Secondary malignant neoplasm of brain: Secondary | ICD-10-CM | POA: Diagnosis present

## 2022-10-04 DIAGNOSIS — Z7901 Long term (current) use of anticoagulants: Secondary | ICD-10-CM | POA: Diagnosis not present

## 2022-10-04 DIAGNOSIS — J90 Pleural effusion, not elsewhere classified: Secondary | ICD-10-CM | POA: Diagnosis not present

## 2022-10-04 DIAGNOSIS — I252 Old myocardial infarction: Secondary | ICD-10-CM

## 2022-10-04 DIAGNOSIS — I130 Hypertensive heart and chronic kidney disease with heart failure and stage 1 through stage 4 chronic kidney disease, or unspecified chronic kidney disease: Secondary | ICD-10-CM | POA: Diagnosis present

## 2022-10-04 DIAGNOSIS — I2510 Atherosclerotic heart disease of native coronary artery without angina pectoris: Secondary | ICD-10-CM | POA: Diagnosis present

## 2022-10-04 DIAGNOSIS — I10 Essential (primary) hypertension: Secondary | ICD-10-CM | POA: Diagnosis not present

## 2022-10-04 DIAGNOSIS — M79605 Pain in left leg: Secondary | ICD-10-CM | POA: Diagnosis present

## 2022-10-04 DIAGNOSIS — Z923 Personal history of irradiation: Secondary | ICD-10-CM

## 2022-10-04 DIAGNOSIS — R0902 Hypoxemia: Secondary | ICD-10-CM | POA: Diagnosis not present

## 2022-10-04 DIAGNOSIS — A419 Sepsis, unspecified organism: Secondary | ICD-10-CM | POA: Diagnosis not present

## 2022-10-04 DIAGNOSIS — Z87891 Personal history of nicotine dependence: Secondary | ICD-10-CM

## 2022-10-04 DIAGNOSIS — Z95810 Presence of automatic (implantable) cardiac defibrillator: Secondary | ICD-10-CM

## 2022-10-04 DIAGNOSIS — R000 Tachycardia, unspecified: Secondary | ICD-10-CM | POA: Diagnosis not present

## 2022-10-04 DIAGNOSIS — I618 Other nontraumatic intracerebral hemorrhage: Secondary | ICD-10-CM | POA: Diagnosis present

## 2022-10-04 DIAGNOSIS — Z7952 Long term (current) use of systemic steroids: Secondary | ICD-10-CM

## 2022-10-04 DIAGNOSIS — R918 Other nonspecific abnormal finding of lung field: Secondary | ICD-10-CM | POA: Diagnosis not present

## 2022-10-04 DIAGNOSIS — Z8249 Family history of ischemic heart disease and other diseases of the circulatory system: Secondary | ICD-10-CM

## 2022-10-04 DIAGNOSIS — R54 Age-related physical debility: Secondary | ICD-10-CM | POA: Diagnosis present

## 2022-10-04 DIAGNOSIS — Z800 Family history of malignant neoplasm of digestive organs: Secondary | ICD-10-CM

## 2022-10-04 DIAGNOSIS — C3411 Malignant neoplasm of upper lobe, right bronchus or lung: Secondary | ICD-10-CM | POA: Diagnosis present

## 2022-10-04 DIAGNOSIS — Z90710 Acquired absence of both cervix and uterus: Secondary | ICD-10-CM

## 2022-10-04 DIAGNOSIS — Z85118 Personal history of other malignant neoplasm of bronchus and lung: Secondary | ICD-10-CM | POA: Diagnosis not present

## 2022-10-04 DIAGNOSIS — J189 Pneumonia, unspecified organism: Secondary | ICD-10-CM | POA: Diagnosis present

## 2022-10-04 DIAGNOSIS — Z1152 Encounter for screening for COVID-19: Secondary | ICD-10-CM | POA: Diagnosis not present

## 2022-10-04 DIAGNOSIS — Z743 Need for continuous supervision: Secondary | ICD-10-CM | POA: Diagnosis not present

## 2022-10-04 DIAGNOSIS — Z66 Do not resuscitate: Secondary | ICD-10-CM | POA: Diagnosis not present

## 2022-10-04 DIAGNOSIS — I255 Ischemic cardiomyopathy: Secondary | ICD-10-CM | POA: Diagnosis present

## 2022-10-04 DIAGNOSIS — Y95 Nosocomial condition: Secondary | ICD-10-CM | POA: Diagnosis present

## 2022-10-04 DIAGNOSIS — M79604 Pain in right leg: Secondary | ICD-10-CM | POA: Diagnosis present

## 2022-10-04 DIAGNOSIS — Z96651 Presence of right artificial knee joint: Secondary | ICD-10-CM | POA: Diagnosis present

## 2022-10-04 DIAGNOSIS — Z825 Family history of asthma and other chronic lower respiratory diseases: Secondary | ICD-10-CM

## 2022-10-04 DIAGNOSIS — C3491 Malignant neoplasm of unspecified part of right bronchus or lung: Secondary | ICD-10-CM | POA: Diagnosis present

## 2022-10-04 DIAGNOSIS — J910 Malignant pleural effusion: Secondary | ICD-10-CM | POA: Diagnosis present

## 2022-10-04 DIAGNOSIS — R404 Transient alteration of awareness: Secondary | ICD-10-CM | POA: Diagnosis not present

## 2022-10-04 DIAGNOSIS — R6510 Systemic inflammatory response syndrome (SIRS) of non-infectious origin without acute organ dysfunction: Secondary | ICD-10-CM

## 2022-10-04 DIAGNOSIS — R4182 Altered mental status, unspecified: Secondary | ICD-10-CM | POA: Diagnosis not present

## 2022-10-04 DIAGNOSIS — R0689 Other abnormalities of breathing: Secondary | ICD-10-CM | POA: Diagnosis not present

## 2022-10-04 DIAGNOSIS — Z79899 Other long term (current) drug therapy: Secondary | ICD-10-CM

## 2022-10-04 DIAGNOSIS — Z8541 Personal history of malignant neoplasm of cervix uteri: Secondary | ICD-10-CM

## 2022-10-04 DIAGNOSIS — R930 Abnormal findings on diagnostic imaging of skull and head, not elsewhere classified: Principal | ICD-10-CM

## 2022-10-04 DIAGNOSIS — I5042 Chronic combined systolic (congestive) and diastolic (congestive) heart failure: Secondary | ICD-10-CM | POA: Diagnosis present

## 2022-10-04 DIAGNOSIS — M1711 Unilateral primary osteoarthritis, right knee: Secondary | ICD-10-CM | POA: Diagnosis present

## 2022-10-04 DIAGNOSIS — Z952 Presence of prosthetic heart valve: Secondary | ICD-10-CM | POA: Diagnosis not present

## 2022-10-04 DIAGNOSIS — Z515 Encounter for palliative care: Secondary | ICD-10-CM

## 2022-10-04 DIAGNOSIS — Z803 Family history of malignant neoplasm of breast: Secondary | ICD-10-CM

## 2022-10-04 DIAGNOSIS — R6520 Severe sepsis without septic shock: Secondary | ICD-10-CM | POA: Diagnosis present

## 2022-10-04 DIAGNOSIS — N1831 Chronic kidney disease, stage 3a: Secondary | ICD-10-CM | POA: Diagnosis present

## 2022-10-04 DIAGNOSIS — R531 Weakness: Secondary | ICD-10-CM | POA: Diagnosis not present

## 2022-10-04 DIAGNOSIS — I5022 Chronic systolic (congestive) heart failure: Secondary | ICD-10-CM | POA: Diagnosis present

## 2022-10-04 LAB — COMPREHENSIVE METABOLIC PANEL
ALT: 20 U/L (ref 0–44)
AST: 51 U/L — ABNORMAL HIGH (ref 15–41)
Albumin: 3.4 g/dL — ABNORMAL LOW (ref 3.5–5.0)
Alkaline Phosphatase: 444 U/L — ABNORMAL HIGH (ref 38–126)
Anion gap: 14 (ref 5–15)
BUN: 24 mg/dL — ABNORMAL HIGH (ref 8–23)
CO2: 23 mmol/L (ref 22–32)
Calcium: 9.2 mg/dL (ref 8.9–10.3)
Chloride: 97 mmol/L — ABNORMAL LOW (ref 98–111)
Creatinine, Ser: 1.21 mg/dL — ABNORMAL HIGH (ref 0.44–1.00)
GFR, Estimated: 51 mL/min — ABNORMAL LOW (ref >60)
Glucose, Bld: 131 mg/dL — ABNORMAL HIGH (ref 70–99)
Potassium: 4 mmol/L (ref 3.5–5.1)
Sodium: 134 mmol/L — ABNORMAL LOW (ref 135–145)
Total Bilirubin: 2.3 mg/dL — ABNORMAL HIGH (ref 0.3–1.2)
Total Protein: 8 g/dL (ref 6.5–8.1)

## 2022-10-04 LAB — CBC WITH DIFFERENTIAL/PLATELET
Abs Immature Granulocytes: 0.53 10*3/uL — ABNORMAL HIGH (ref 0.00–0.07)
Basophils Absolute: 0.1 10*3/uL (ref 0.0–0.1)
Basophils Relative: 1 %
Eosinophils Absolute: 1.7 10*3/uL — ABNORMAL HIGH (ref 0.0–0.5)
Eosinophils Relative: 7 %
HCT: 39.9 % (ref 36.0–46.0)
Hemoglobin: 12.6 g/dL (ref 12.0–15.0)
Immature Granulocytes: 2 %
Lymphocytes Relative: 9 %
Lymphs Abs: 2 10*3/uL (ref 0.7–4.0)
MCH: 28.4 pg (ref 26.0–34.0)
MCHC: 31.6 g/dL (ref 30.0–36.0)
MCV: 89.9 fL (ref 80.0–100.0)
Monocytes Absolute: 1.9 10*3/uL — ABNORMAL HIGH (ref 0.1–1.0)
Monocytes Relative: 8 %
Neutro Abs: 17 10*3/uL — ABNORMAL HIGH (ref 1.7–7.7)
Neutrophils Relative %: 73 %
Platelets: 200 10*3/uL (ref 150–400)
RBC: 4.44 MIL/uL (ref 3.87–5.11)
RDW: 19 % — ABNORMAL HIGH (ref 11.5–15.5)
WBC: 23.2 10*3/uL — ABNORMAL HIGH (ref 4.0–10.5)
nRBC: 0.3 % — ABNORMAL HIGH (ref 0.0–0.2)

## 2022-10-04 LAB — TROPONIN I (HIGH SENSITIVITY): Troponin I (High Sensitivity): 118 ng/L (ref <18)

## 2022-10-04 LAB — PROTIME-INR
INR: 2.7 — ABNORMAL HIGH (ref 0.8–1.2)
Prothrombin Time: 28.1 seconds — ABNORMAL HIGH (ref 11.4–15.2)

## 2022-10-04 LAB — BODY FLUID CELL COUNT WITH DIFFERENTIAL
Eos, Fluid: 4 %
Lymphs, Fluid: 83 %
Monocyte-Macrophage-Serous Fluid: 8 %
Neutrophil Count, Fluid: 5 %
Total Nucleated Cell Count, Fluid: 2094 cu mm

## 2022-10-04 LAB — PATHOLOGIST SMEAR REVIEW

## 2022-10-04 LAB — RESP PANEL BY RT-PCR (FLU A&B, COVID) ARPGX2
Influenza A by PCR: NEGATIVE
Influenza B by PCR: NEGATIVE
SARS Coronavirus 2 by RT PCR: NEGATIVE

## 2022-10-04 LAB — CK: Total CK: 68 U/L (ref 38–234)

## 2022-10-04 LAB — LACTIC ACID, PLASMA: Lactic Acid, Venous: 2.2 mmol/L (ref 0.5–1.9)

## 2022-10-04 LAB — CBG MONITORING, ED: Glucose-Capillary: 128 mg/dL — ABNORMAL HIGH (ref 70–99)

## 2022-10-04 LAB — TSH: TSH: 3.915 u[IU]/mL (ref 0.350–4.500)

## 2022-10-04 LAB — MRSA NEXT GEN BY PCR, NASAL: MRSA by PCR Next Gen: NOT DETECTED

## 2022-10-04 LAB — LIPASE, BLOOD: Lipase: 37 U/L (ref 11–51)

## 2022-10-04 MED ORDER — ALBUTEROL SULFATE (2.5 MG/3ML) 0.083% IN NEBU: 2.5000 mg | INHALATION_SOLUTION | RESPIRATORY_TRACT | Status: DC | PRN

## 2022-10-04 MED ORDER — SODIUM CHLORIDE 0.9 % IV SOLN
2.0000 g | Freq: Once | INTRAVENOUS | Status: AC
  Administered 2022-10-04: 2 g via INTRAVENOUS
  Filled 2022-10-04: qty 12.5

## 2022-10-04 MED ORDER — VANCOMYCIN HCL 2000 MG/400ML IV SOLN
2000.0000 mg | Freq: Once | INTRAVENOUS | Status: AC
  Administered 2022-10-04: 2000 mg via INTRAVENOUS
  Filled 2022-10-04: qty 400

## 2022-10-04 MED ORDER — OXYCODONE HCL 5 MG PO TABS: 5.0000 mg | ORAL_TABLET | ORAL | 0 refills | Status: DC | PRN

## 2022-10-04 MED ORDER — IOHEXOL 350 MG/ML SOLN
75.0000 mL | Freq: Once | INTRAVENOUS | Status: AC | PRN
  Administered 2022-10-04: 75 mL via INTRAVENOUS

## 2022-10-04 MED ORDER — ONDANSETRON HCL 4 MG/2ML IJ SOLN: 4.0000 mg | Freq: Four times a day (QID) | INTRAMUSCULAR | Status: DC | PRN

## 2022-10-04 MED ORDER — WARFARIN SODIUM 2 MG PO TABS
2.0000 mg | ORAL_TABLET | Freq: Once | ORAL | Status: DC
  Filled 2022-10-04: qty 1

## 2022-10-04 MED ORDER — LIDOCAINE HCL (PF) 1 % IJ SOLN
10.0000 mL | Freq: Once | INTRAMUSCULAR | Status: AC
  Administered 2022-10-04: 10 mL via INTRADERMAL

## 2022-10-04 MED ORDER — SODIUM CHLORIDE 0.9 % IV SOLN
2.0000 g | Freq: Two times a day (BID) | INTRAVENOUS | Status: DC
  Administered 2022-10-04: 2 g via INTRAVENOUS
  Filled 2022-10-04: qty 12.5

## 2022-10-04 MED ORDER — OXYCODONE HCL 5 MG PO TABS: 10.0000 mg | ORAL_TABLET | ORAL | Status: DC | PRN

## 2022-10-04 MED ORDER — WARFARIN - PHARMACIST DOSING INPATIENT
Freq: Every day | Status: DC
  Filled 2022-10-04: qty 1

## 2022-10-04 MED ORDER — FENTANYL CITRATE PF 50 MCG/ML IJ SOSY
25.0000 ug | PREFILLED_SYRINGE | Freq: Once | INTRAMUSCULAR | Status: AC
  Administered 2022-10-04: 25 ug via INTRAVENOUS
  Filled 2022-10-04: qty 1

## 2022-10-04 MED ORDER — LORAZEPAM 2 MG/ML PO CONC: 1.0000 mg | ORAL | Status: DC | PRN

## 2022-10-04 MED ORDER — ACETAMINOPHEN 325 MG PO TABS: 650.0000 mg | ORAL_TABLET | Freq: Four times a day (QID) | ORAL | Status: DC | PRN

## 2022-10-04 MED ORDER — ONDANSETRON HCL 4 MG PO TABS: 4.0000 mg | ORAL_TABLET | Freq: Four times a day (QID) | ORAL | Status: DC | PRN

## 2022-10-04 MED ORDER — VANCOMYCIN HCL IN DEXTROSE 1-5 GM/200ML-% IV SOLN: 1000.0000 mg | Freq: Once | INTRAVENOUS | Status: DC

## 2022-10-04 MED ORDER — ACETAMINOPHEN 325 MG RE SUPP: 650.0000 mg | Freq: Four times a day (QID) | RECTAL | Status: DC | PRN

## 2022-10-04 MED ORDER — LEVETIRACETAM IN NACL 500 MG/100ML IV SOLN
500.0000 mg | Freq: Two times a day (BID) | INTRAVENOUS | Status: DC
  Administered 2022-10-04 (×2): 500 mg via INTRAVENOUS
  Filled 2022-10-04 (×2): qty 100

## 2022-10-04 MED ORDER — DEXAMETHASONE SODIUM PHOSPHATE 4 MG/ML IJ SOLN
4.0000 mg | Freq: Four times a day (QID) | INTRAMUSCULAR | Status: DC
  Administered 2022-10-04 (×3): 4 mg via INTRAVENOUS
  Filled 2022-10-04 (×3): qty 1

## 2022-10-04 MED ORDER — VANCOMYCIN HCL 750 MG/150ML IV SOLN: 750.0000 mg | INTRAVENOUS | Status: DC

## 2022-10-04 MED ORDER — HYDROMORPHONE HCL 1 MG/ML IJ SOLN
1.0000 mg | INTRAMUSCULAR | Status: DC | PRN
  Administered 2022-10-04 (×2): 1 mg via INTRAVENOUS
  Filled 2022-10-04 (×2): qty 1

## 2022-10-04 MED ORDER — LACTATED RINGERS IV SOLN: INTRAVENOUS | Status: AC

## 2022-10-04 MED FILL — Dexamethasone Sodium Phosphate Inj 100 MG/10ML: INTRAMUSCULAR | Qty: 1 | Status: AC

## 2022-10-04 MED FILL — Fosaprepitant Dimeglumine For IV Infusion 150 MG (Base Eq): INTRAVENOUS | Qty: 5 | Status: AC

## 2022-10-04 NOTE — Consult Note (Signed)
PHARMACY -  BRIEF ANTIBIOTIC NOTE   Pharmacy has received consult(s) for vancomycin and cefepime from an ED provider.  The patient's profile has been reviewed for ht/wt/allergies/indication/available labs.    One time order(s) placed for  Cefepime 2 gram Vancomycin 2 gram  Further antibiotics/pharmacy consults should be ordered by admitting physician if indicated.                       Thank you, Dorothe Pea, PharmD, BCPS Clinical Pharmacist   10/04/2022  4:37 AM

## 2022-10-04 NOTE — ED Notes (Signed)
O2 titrated to 3L due to O2 in mid 80s. O2 currently 91%

## 2022-10-04 NOTE — IPAL (Signed)
  Interdisciplinary Goals of Care Family Meeting   Date carried out: 10/04/2022  Location of the meeting: Bedside  Member's involved: Physician and Family Member or next of kin  Durable Power of Attorney or acting medical decision maker: Daughter Vanessa Carlson  Discussion: We discussed goals of care for Vanessa Carlson .   I have reviewed medical records including EPIC notes, labs and imaging, assessed the patient and then met with patient and daughter to discuss major active diagnoses, plan of care, natural trajectory, prognosis, GOC, EOL wishes, disposition and options including Full code/DNI/DNR and the concept of comfort care if DNR is elected. Questions and concerns were addressed. They are  in agreement to continue current plan of care . Election for full code status.   Code status:   Code Status: Full Code   Disposition: Continue current acute care  Time spent for the meeting: [place], MD  10/04/2022, 5:52 AM

## 2022-10-04 NOTE — Discharge Summary (Signed)
Discharge Summary  Vanessa Carlson:[id number] DOB: 05/13/1961  PCP: Virginia Crews, MD  Admit date: 10/04/2022 Discharge date: 10/04/2022  Discharge Diagnoses:  Active Hospital Problems   Diagnosis Date Noted   Acute metabolic encephalopathy 49/70/2637    Priority: 1.   Metastasis to brain,hemorrhagic [facility]) 10/04/2022    Priority: 1.   Acute respiratory failure with hypoxia ([place]) 10/04/2022    Priority: 2.   Pleural effusion on right 09/10/2022    Priority: 2.   Lung cancer, primary, with metastasis from lung to other site, right ([place]) 06/08/2022    Priority: 2.   Severe sepsis ([place]) 05/28/2021    Priority: 2.   Chronic anticoagulation 07/08/2017    Priority: 3.   H/O mitral valve replacement with mechanical valve 02/24/2017    Priority: 3.   Ischemic cardiomyopathy 02/24/2017    Priority: 4.   Chronic HFrEF (heart failure with reduced ejection fraction) ([place]) 01/02/2017    Priority: 4.   Stage 3a chronic kidney disease ([place]) 10/04/2022   Palliative care encounter 10/04/2022    Resolved Hospital Problems  No resolved problems to display.   Discharge Condition: Stable   Diet recommendation: Diet Orders (From admission, onward)     Start     Ordered   10/04/22 1249  Diet regular Room service appropriate? Yes; Fluid consistency: Thin  Diet effective now       Question Answer Comment  Room service appropriate? Yes   Fluid consistency: Thin      10/04/22 1248           HPI and Brief Hospital Course:  This is an unfortunate 62 year old female with a medical history significant for CAD, ischemic cardiomyopathy status post AICD placement, rheumatic heart disease status post mechanical mitral valve replacement on Coumadin who was diagnosed with recurrent right lung cancer last month who was brought to the ED by family with a several day history of confusion and overall decline.  Unfortunately workup in the ER showed evidence of hemorrhagic metastases to the  brain without edema.  He was admitted to the hospitalist service, treated with dexamethasone and Keppra.  She was seen in consultation by oncology, as well as interventional radiology who performed repeat ultrasound-guided thoracentesis.  Her oncology team recommended consideration of comfort care/hospice.  I had multiple meetings and discussions with the patient and her family this morning and afternoon.  After some discussion, they elected to change the patient to DNR status.  They told me that they had already been considering hospice prior to the discovery of her metastatic brain lesions this morning.  As such, they requested hospice evaluation for possibility of admission to inpatient hospice house.  They were able to speak with hospice staff, and the patient has been accepted to be admitted to inpatient hospice house tonight.  Orders have been placed for her AICD to be deactivated, after which she can be discharged from this facility to the inpatient hospice house.  Discharge details, plan of care were discussed prior to discharge with the patient and her husband.  Patient and family are in agreement with discharge from the hospital today and all questions were answered to their satisfaction.  Discharge Exam: BP (!) 145/89   Pulse (!) 116   Temp (!) 97.5 F (36.4 C) (Axillary)   Resp (!) 21   Wt 89 kg   LMP  (LMP Unknown)   SpO2 97%   BMI 35.89 kg/m  General:  Alert, oriented, calm, chronically ill-appearing, husband at  the bedside Eyes: EOMI, clear sclerea Neck: supple, no masses, trachea mildline  Cardiovascular: RRR, no murmurs or rubs, no peripheral edema  Respiratory: clear to auscultation bilaterally, no wheezes, no crackles  Abdomen: soft, nontender, nondistended, normal bowel tones heard  Skin: dry, no rashes  Musculoskeletal: no joint effusions, normal range of motion  Psychiatric: appropriate affect, normal speech  Neurologic: extraocular muscles intact, clear speech, moving  all extremities with intact sensorium   Discharge Instructions You were cared for by a hospitalist during your hospital stay. If you have any questions about your discharge medications or the care you received while you were in the hospital after you are discharged, you can call the unit and asked to speak with the hospitalist on call if the hospitalist that took care of you is not available. Once you are discharged, your primary care physician will handle any further medical issues. Please note that NO REFILLS for any discharge medications will be authorized once you are discharged, as it is imperative that you return to your primary care physician (or establish a relationship with a primary care physician if you do not have one) for your aftercare needs so that they can reassess your need for medications and monitor your lab values.   Allergies as of 10/04/2022   No Known Allergies      Medication List     STOP taking these medications    aspirin 81 MG chewable tablet   atorvastatin 80 MG tablet Commonly known as: LIPITOR   docusate sodium 250 MG capsule Commonly known as: COLACE   ezetimibe 10 MG tablet Commonly known as: ZETIA   furosemide 40 MG tablet Commonly known as: LASIX   gabapentin 300 MG capsule Commonly known as: NEURONTIN   LORazepam 0.5 MG tablet Commonly known as: ATIVAN   metolazone 2.5 MG tablet Commonly known as: ZAROXOLYN   metoprolol succinate 25 MG 24 hr tablet Commonly known as: TOPROL-XL   midodrine 10 MG tablet Commonly known as: PROAMATINE   OLANZapine 5 MG tablet Commonly known as: ZyPREXA   ondansetron 4 MG tablet Commonly known as: Zofran   oxybutynin 10 MG 24 hr tablet Commonly known as: DITROPAN-XL   oxyCODONE 5 MG immediate release tablet Commonly known as: Oxy IR/ROXICODONE   potassium chloride SA 20 MEQ tablet Commonly known as: KLOR-CON M   predniSONE 10 MG tablet Commonly known as: DELTASONE   warfarin 2 MG  tablet Commonly known as: COUMADIN       No Known Allergies   The results of significant diagnostics from this hospitalization (including imaging, microbiology, ancillary and laboratory) are listed below for reference.    Significant Diagnostic Studies: US THORACENTESIS ASP PLEURAL SPACE W/IMG GUIDE  Result Date: 10/04/2022 INDICATION: Patient with history of non-small cell lung cancer with recurrent right pleural effusion. Request received for diagnostic and therapeutic right thoracentesis. EXAM: ULTRASOUND GUIDED DIAGNOSTIC AND THERAPEUTIC RIGHT THORACENTESIS MEDICATIONS: 10 mL 1 % lidocaine COMPLICATIONS: None immediate. PROCEDURE: An ultrasound guided thoracentesis was thoroughly discussed with the patient and questions answered. The benefits, risks, alternatives and complications were also discussed. The patient understands and wishes to proceed with the procedure. Written consent was obtained. Ultrasound was performed to localize and mark an adequate pocket of fluid in the right chest. The area was then prepped and draped in the normal sterile fashion. 1% Lidocaine was used for local anesthesia. Under ultrasound guidance a 6 Fr Safe-T-Centesis catheter was introduced. Thoracentesis was performed. The catheter was removed and a dressing applied.  FINDINGS: A total of approximately 900 cc of dark brown fluid was removed. Samples were sent to the laboratory as requested by the clinical team. IMPRESSION: Successful ultrasound guided right thoracentesis yielding 900 cc of pleural fluid. Read by: Narda Rutherford, AGNP-BC Electronically Signed   By: Corrie Mckusick D.O.   On: 10/04/2022 11:30   DG Chest Port 1 View  Result Date: 10/04/2022 CLINICAL DATA:  Recurrent right pleural effusion status post thoracentesis. EXAM: PORTABLE CHEST 1 VIEW COMPARISON:  10/04/2022 at 0238 hours and CT chest 10/04/2022. FINDINGS: Trachea is midline. Heart size grossly stable. Left ICD lead tip projects over the right  ventricle. Right IJ Port-A-Cath tip is in the low SVC. Interval decrease in size of a loculated right pleural effusion, still moderate to large in size. No pneumothorax. Right upper lobe mass with perihilar collapse/consolidation better evaluated on CT chest performed earlier today. Left lung nodules better seen on today's CT. Minimal streaky atelectasis in the left lung base. Tiny left pleural effusion. IMPRESSION: 1. Moderate to large loculated right pleural effusion, slightly decreased in size from exam performed earlier today. No pneumothorax. 2. Right upper lobe mass, perihilar airspace consolidation and left lung nodules, better evaluated on today's CT chest. 3. Tiny left pleural effusion. Electronically Signed   By: Lorin Picket M.D.   On: 10/04/2022 09:48   CT Angio Chest PE W and/or Wo Contrast  Result Date: 10/04/2022 CLINICAL DATA:  Pulmonary embolism suspected EXAM: CT ANGIOGRAPHY CHEST WITH CONTRAST TECHNIQUE: Multidetector CT imaging of the chest was performed using the standard protocol during bolus administration of intravenous contrast. Multiplanar CT image reconstructions and MIPs were obtained to evaluate the vascular anatomy. RADIATION DOSE REDUCTION: This exam was performed according to the departmental dose-optimization program which includes automated exposure control, adjustment of the mA and/or kV according to patient size and/or use of iterative reconstruction technique. CONTRAST:  108mL OMNIPAQUE IOHEXOL 350 MG/ML SOLN COMPARISON:  Chest CT 09/10/2022 FINDINGS: Cardiovascular: Satisfactory opacification of the pulmonary arteries to the segmental level. No evidence of pulmonary embolism. Cardiomegaly with thinning of the anterior wall and apex of the left ventricle where there is faint calcification. Mitral valve replacement. Coronary atherosclerosis Mediastinum/Nodes: Right paratracheal adenopathy with rounded 12 mm node similar to prior. No hematoma. Lungs/Pleura: Right apical mass  with borders poorly defined by arterial timing, ~ 7 cm in size with fracturing/erosion of lateral right second and third ribs. Large right pleural effusion which appears complicated with layering debris. The right lower lobe is collapsed. Patchy nodules in the left lung especially the upper lobe, also seen on prior. Upper Abdomen: No acute finding. Ill-defined medial segment right liver lesion measuring approximately 1 cm. Musculoskeletal: Eroded anterior to lateral right second and third ribs which are closely approximating the right upper lobe mass. Diffuse heterogeneity of the spine attributed to widespread metastases. Review of the MIP images confirms the above findings. IMPRESSION: 1. Negative for pulmonary embolism. 2. Known right upper lobe malignancy with osseous, pulmonary, and possibly hepatic metastatic spread. Continued large right pleural effusion with lower lobe collapse. Electronically Signed   By: Jorje Guild M.D.   On: 10/04/2022 04:21   CT Head Wo Contrast  Result Date: 10/04/2022 CLINICAL DATA:  Altered mental status EXAM: CT HEAD WITHOUT CONTRAST TECHNIQUE: Contiguous axial images were obtained from the base of the skull through the vertex without intravenous contrast. RADIATION DOSE REDUCTION: This exam was performed according to the departmental dose-optimization program which includes automated exposure control, adjustment of the mA  and/or kV according to patient size and/or use of iterative reconstruction technique. COMPARISON:  None Available. FINDINGS: Brain: There are numerous (at least 25) hyperdense lesions scattered throughout the brain. The largest abuts the right lateral ventricle and measures 10 mm. There is no edema. Vascular: No abnormal hyperdensity of the major intracranial arteries or dural venous sinuses. No intracranial atherosclerosis. Skull: The visualized skull base, calvarium and extracranial soft tissues are normal. Sinuses/Orbits: No fluid levels or advanced  mucosal thickening of the visualized paranasal sinuses. No mastoid or middle ear effusion. The orbits are normal. 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 These are likely hemorrhagic, but acuity is uncertain. Critical Value/emergent results were called by telephone at the time of interpretation on 10/04/2022 at 4:13 am to provider MARK QUALE , who verbally acknowledged these results. Electronically Signed   By: Ulyses Jarred M.D.   On: 10/04/2022 04:13   DG Chest Portable 1 View  Result Date: 10/04/2022 CLINICAL DATA:  Hypoxia EXAM: PORTABLE CHEST 1 VIEW COMPARISON:  09/13/2022 FINDINGS: Unchanged position of left chest wall AICD. There is a right chest wall power-injectable Port-A-Cath with tip at the cavoatrial junction via a right internal jugular vein approach. Large right pleural effusion with near complete opacification of the right hemithorax, worsened. Left lung is clear. Cardiomediastinal contours are normal. Remote median sternotomy and valvuloplasty. IMPRESSION: Large right pleural effusion with near complete opacification of the right hemithorax, worsened from prior. Electronically Signed   By: Ulyses Jarred M.D.   On: 10/04/2022 02:54   US THORACENTESIS ASP PLEURAL SPACE W/IMG GUIDE  Result Date: 09/13/2022 INDICATION: Patient with history of non-small cell lung cancer with recurrent right pleural effusion. Request received for therapeutic thoracentesis. EXAM: ULTRASOUND GUIDED THERAPEUTIC RIGHT THORACENTESIS MEDICATIONS: 10 mL 1 % lidocaine COMPLICATIONS: None immediate. PROCEDURE: An ultrasound guided thoracentesis was thoroughly discussed with the patient and questions answered. The benefits, risks, alternatives and complications were also discussed. The patient understands and wishes to proceed with the procedure. Written consent was obtained. Ultrasound was performed to localize and mark an adequate pocket of fluid in the right  chest. The area was then prepped and draped in the normal sterile fashion. 1% Lidocaine was used for local anesthesia. Under ultrasound guidance a 6 Fr Safe-T-Centesis catheter was introduced. Thoracentesis was performed. The catheter was removed and a dressing applied. FINDINGS: A total of approximately 1.3 L of dark, bloody fluid was removed. IMPRESSION: Successful ultrasound guided right thoracentesis yielding 1.3 L of pleural fluid. Read by: Narda Rutherford, AGNP-BC Electronically Signed   By: Miachel Roux M.D.   On: 09/13/2022 14:39   DG Chest Port 1 View  Result Date: 09/13/2022 CLINICAL DATA:  Post thora EXAM: PORTABLE CHEST - 1 VIEW COMPARISON:  CT 09/10/2022 FINDINGS: No pneumothorax. Persistent moderate right pleural effusion, with atelectasis/consolidation at the right lung base. Left lung clear. Stable right IJ port catheter to the distal SVC. Stable left subclavian AICD. Heart size and mediastinal contours are within normal limits. Post MVR. Sternotomy wires. IMPRESSION: Persistent moderate right pleural effusion. No pneumothorax post thoracentesis. Electronically Signed   By: Lucrezia Europe M.D.   On: 09/13/2022 14:11   ECHOCARDIOGRAM COMPLETE  Result Date: 09/11/2022    ECHOCARDIOGRAM REPORT   Patient Name:   BRIGITTA PRICER Kocak Date of Exam: 09/11/2022 Medical Rec #:  [id number]         Height:       62.0 in Accession #:    [id number]  Weight:       203.9 lb Date of Birth:  September 28, 1960          BSA:          1.928 m Patient Age:    62 years          BP:           93/62 mmHg Patient Gender: F                 HR:           83 bpm. Exam Location:  [facility] Procedure: 2D Echo and Intracardiac Opacification Agent Indications:     CHF I50.21  History:         Patient has prior history of Echocardiogram examinations, most                  recent 01/28/2022.                   Mitral Valve: 27 mm mechanical valve valve is present in the                  mitral position.  Sonographer:     Kathlen Brunswick RDCS  Referring Phys:  [id number] [place] Diagnosing Phys: Eleonore Chiquito MD  Sonographer Comments: Technically difficult study due to poor echo windows and suboptimal subcostal window. Image acquisition challenging due to patient body habitus. IMPRESSIONS  1. Left ventricular ejection fraction, by estimation, is 20 to 25%. The left ventricle has severely decreased function. The left ventricle demonstrates regional wall motion abnormalities (see scoring diagram/findings for description). Left ventricular diastolic function could not be evaluated.  2. Right ventricular systolic function is normal. The right ventricular size is normal. There is normal pulmonary artery systolic pressure. The estimated right ventricular systolic pressure is 42.7 mmHg.  3. The mitral valve has been repaired/replaced. No evidence of mitral valve regurgitation. The mean mitral valve gradient is 2.0 mmHg with average heart rate of 85 bpm. There is a 27 mm mechanical valve present in the mitral position. Echo findings are consistent with normal structure and function of the mitral valve prosthesis.  4. The aortic valve is tricuspid. Aortic valve regurgitation is not visualized. No aortic stenosis is present.  5. The inferior vena cava is normal in size with greater than 50% respiratory variability, suggesting right atrial pressure of 3 mmHg. Comparison(s): No significant change from prior study. Conclusion(s)/Recommendation(s): Findings consistent with ischemic cardiomyopathy. No left ventricular mural or apical thrombus/thrombi. FINDINGS  Left Ventricle: Left ventricular ejection fraction, by estimation, is 20 to 25%. The left ventricle has severely decreased function. The left ventricle demonstrates regional wall motion abnormalities. Definity contrast agent was given IV to delineate the left ventricular endocardial borders. The left ventricular internal cavity size was normal in size. There is no left ventricular hypertrophy. Left  ventricular diastolic function could not be evaluated due to mitral valve replacement. Left ventricular  diastolic function could not be evaluated.  LV Wall Scoring: The mid and distal anterior septum, apical lateral segment, apical anterior segment, and apex are akinetic. The anterior wall, antero-lateral wall, entire inferior wall, posterior wall, basal anteroseptal segment, mid inferoseptal segment, and basal inferoseptal segment are hypokinetic. Right Ventricle: The right ventricular size is normal. No increase in right ventricular wall thickness. Right ventricular systolic function is normal. There is normal pulmonary artery systolic pressure. The tricuspid regurgitant velocity is 2.68 m/s, and  with an assumed right atrial pressure of 3 mmHg,  the estimated right ventricular systolic pressure is 37.1 mmHg. Left Atrium: Left atrial size was normal in size. Right Atrium: Right atrial size was normal in size. Pericardium: There is no evidence of pericardial effusion. Mitral Valve: The mitral valve has been repaired/replaced. No evidence of mitral valve regurgitation. There is a 27 mm mechanical valve present in the mitral position. Echo findings are consistent with normal structure and function of the mitral valve prosthesis. MV peak gradient, 4.8 mmHg. The mean mitral valve gradient is 2.0 mmHg with average heart rate of 85 bpm. Tricuspid Valve: The tricuspid valve is grossly normal. Tricuspid valve regurgitation is mild . No evidence of tricuspid stenosis. Aortic Valve: The aortic valve is tricuspid. Aortic valve regurgitation is not visualized. No aortic stenosis is present. Aortic valve peak gradient measures 6.2 mmHg. Pulmonic Valve: The pulmonic valve was grossly normal. Pulmonic valve regurgitation is mild. No evidence of pulmonic stenosis. Aorta: The aortic root and ascending aorta are structurally normal, with no evidence of dilitation. Venous: The inferior vena cava is normal in size with greater than  50% respiratory variability, suggesting right atrial pressure of 3 mmHg. IAS/Shunts: The atrial septum is grossly normal. Additional Comments: A device lead is visualized in the right atrium and right ventricle.  LEFT VENTRICLE PLAX 2D LVIDd:         5.20 cm     Diastology LVIDs:         4.70 cm     LV e' medial:    6.53 cm/s LV PW:         0.80 cm     LV E/e' medial:  11.6 LV IVS:        0.80 cm     LV e' lateral:   8.16 cm/s LVOT diam:     1.65 cm     LV E/e' lateral: 9.3 LV SV:         28 LV SV Index:   14 LVOT Area:     2.14 cm  LV Volumes (MOD) LV vol d, MOD A2C: 98.8 ml LV vol d, MOD A4C: 94.4 ml LV vol s, MOD A2C: 49.1 ml LV vol s, MOD A4C: 63.6 ml LV SV MOD A2C:     49.8 ml LV SV MOD A4C:     94.4 ml LV SV MOD BP:      42.9 ml RIGHT VENTRICLE RV Basal diam:  2.20 cm LEFT ATRIUM             Index LA diam:        3.50 cm 1.82 cm/m LA Vol (A2C):   82.2 ml 42.64 ml/m LA Vol (A4C):   30.2 ml 15.67 ml/m LA Biplane Vol: 51.7 ml 26.82 ml/m  AORTIC VALVE                 PULMONIC VALVE AV Area (Vmax): 1.28 cm     PV Vmax:          0.90 m/s AV Vmax:        124.00 cm/s  PV Peak grad:     3.2 mmHg AV Peak Grad:   6.2 mmHg     PR End Diast Vel: 5.20 msec LVOT Vmax:      74.20 cm/s LVOT Vmean:     46.800 cm/s LVOT VTI:       0.130 m  AORTA Ao Root diam: 2.70 cm Ao Asc diam:  2.40 cm MITRAL VALVE  TRICUSPID VALVE MV Area (PHT): 3.27 cm     TR Peak grad:   28.7 mmHg MV Area VTI:   1.27 cm     TR Vmax:        268.00 cm/s MV Peak grad:  4.8 mmHg MV Mean grad:  2.0 mmHg     SHUNTS MV Vmax:       1.10 m/s     Systemic VTI:  0.13 m MV Vmean:      55.7 cm/s    Systemic Diam: 1.65 cm MV Decel Time: 232 msec MV E velocity: 75.50 cm/s MV A velocity: 112.00 cm/s MV E/A ratio:  0.67 Eleonore Chiquito MD Electronically signed by Eleonore Chiquito MD Signature Date/Time: 09/11/2022/5:40:25 PM    Final    CT CHEST ABDOMEN PELVIS WO CONTRAST  Result Date: 09/10/2022 CLINICAL DATA:  Non-small cell lung cancer, elevated INR  EXAM: CT CHEST, ABDOMEN AND PELVIS WITHOUT CONTRAST TECHNIQUE: Multidetector CT imaging of the chest, abdomen and pelvis was performed following the standard protocol without IV contrast. Examination was performed without intravenous contrast at the request of the ordering physician. Evaluation of the soft tissues, vascular structures, and solid viscera is limited. RADIATION DOSE REDUCTION: This exam was performed according to the departmental dose-optimization program which includes automated exposure control, adjustment of the mA and/or kV according to patient size and/or use of iterative reconstruction technique. COMPARISON:  05/21/2022, 06/10/2022 FINDINGS: CT CHEST FINDINGS Cardiovascular: Stable cardiac pacer. Mitral valve prosthesis. No pericardial effusion. Normal caliber of the thoracic aorta. Stable atherosclerosis of the aorta and coronary vasculature. Mediastinum/Nodes: Thyroid, trachea, and esophagus are grossly unremarkable. 9 mm short axis pretracheal lymph node reference image 17/2, previously measuring approximately 12 mm. Evaluation of the mediastinum and hilar regions is limited due to the lack of intravenous contrast. Lungs/Pleura: Right upper lobe mass measures 6.9 x 5.0 cm reference image 15/2, previously measuring 6.4 x 4.6 cm, compatible with recurrent right upper lobe lung cancer. There is a large multiloculated right pleural effusion. Compressive atelectasis is seen within the right middle and right lower lobes, with minimal aeration superior segment right lower lobe. There is increase in the size and number of left lung pulmonary nodules seen on prior study, consistent with progressive metastatic disease. Index nodule left upper lobe image 34/4 measures 12 x 8 mm, previously 6 x 5 mm. No left-sided effusion. No pneumothorax. Musculoskeletal: Stable right anterolateral second and third rib fractures. No new bony abnormalities. Reconstructed images demonstrate no additional findings. CT  ABDOMEN PELVIS FINDINGS Hepatobiliary: Gallbladder sludge layering dependently within the gallbladder. No calcified gallstones or cholecystitis. Indeterminate 8 mm hypodensity inferior right lobe liver image 59/2, metastatic disease cannot be excluded. No biliary duct dilation. Pancreas: Unremarkable unenhanced appearance. Spleen: Unremarkable unenhanced appearance. Adrenals/Urinary Tract: No urinary tract calculi or obstructive uropathy. The adrenals and bladder are grossly unremarkable. Stomach/Bowel: No bowel obstruction or ileus. No bowel wall thickening or inflammatory change. Vascular/Lymphatic: Aortic atherosclerosis. No enlarged abdominal or pelvic lymph nodes. Reproductive: Prior hysterectomy.  No adnexal masses. Other: No free fluid or free intraperitoneal gas. No abdominal wall hernia. Musculoskeletal: No acute or destructive bony lesions. Reconstructed images demonstrate no additional findings. IMPRESSION: 1. Enlarging right upper lobe mass consistent with progressive lung cancer. 2. Large multiloculated right pleural effusion, increased in size since prior study. Significant compressive atelectasis within the right middle and right lower lobe, with only a small portion of normally aerated lung within the superior segment right lower lobe. 3. New and enlarging left lung  pulmonary nodules consistent with progressive metastatic disease. 4. Indeterminate 8 mm hypodensity inferior right lobe liver, not well seen on previous exams. Metastatic disease cannot be excluded. 5. Grossly stable borderline enlarged mediastinal adenopathy. 6. Gallbladder sludge. No evidence of cholelithiasis or cholecystitis. 7.  Aortic Atherosclerosis (ICD10-I70.0). Electronically Signed   By: Randa Ngo M.D.   On: 09/10/2022 17:50    Microbiology: Recent Results (from the past 240 hour(s))  Resp Panel by RT-PCR (Flu A&B, Covid) Anterior Nasal Swab     Status: None   Collection Time: 10/04/22  2:24 AM   Specimen: Anterior  Nasal Swab  Result Value Ref Range Status   SARS Coronavirus 2 by RT PCR NEGATIVE NEGATIVE Final    Comment: (NOTE) SARS-CoV-2 target nucleic acids are NOT DETECTED.  The SARS-CoV-2 RNA is generally detectable in upper respiratory specimens during the acute phase of infection. The lowest concentration of SARS-CoV-2 viral copies this assay can detect is 138 copies/mL. A negative result does not preclude SARS-Cov-2 infection and should not be used as the sole basis for treatment or other patient management decisions. A negative result may occur with  improper specimen collection/handling, submission of specimen other than nasopharyngeal swab, presence of viral mutation(s) within the areas targeted by this assay, and inadequate number of viral copies(<138 copies/mL). A negative result must be combined with clinical observations, patient history, and epidemiological information. The expected result is Negative.  Fact Sheet for Patients:  EntrepreneurPulse.com.au  Fact Sheet for Healthcare Providers:  IncredibleEmployment.be  This test is no t yet approved or cleared by the [place] FDA and  has been authorized for detection and/or diagnosis of SARS-CoV-2 by FDA under an Emergency Use Authorization (EUA). This EUA will remain  in effect (meaning this test can be used) for the duration of the COVID-19 declaration under Section 564(b)(1) of the Act, 21 U.S.C.section 360bbb-3(b)(1), unless the authorization is terminated  or revoked sooner.       Influenza A by PCR NEGATIVE NEGATIVE Final   Influenza B by PCR NEGATIVE NEGATIVE Final    Comment: (NOTE) The Xpert Xpress SARS-CoV-2/FLU/RSV plus assay is intended as an aid in the diagnosis of influenza from Nasopharyngeal swab specimens and should not be used as a sole basis for treatment. Nasal washings and aspirates are unacceptable for Xpert Xpress SARS-CoV-2/FLU/RSV testing.  Fact Sheet for  Patients: EntrepreneurPulse.com.au  Fact Sheet for Healthcare Providers: IncredibleEmployment.be  This test is not yet approved or cleared by the [place] FDA and has been authorized for detection and/or diagnosis of SARS-CoV-2 by FDA under an Emergency Use Authorization (EUA). This EUA will remain in effect (meaning this test can be used) for the duration of the COVID-19 declaration under Section 564(b)(1) of the Act, 21 U.S.C. section 360bbb-3(b)(1), unless the authorization is terminated or revoked.  Performed at [facility], [address] [place], [place] [address]   Blood culture (routine x 2)     Status: None (Preliminary result)   Collection Time: 10/04/22  3:23 AM   Specimen: BLOOD LEFT HAND  Result Value Ref Range Status   Specimen Description BLOOD LEFT HAND  Final   Special Requests   Final    BOTTLES DRAWN AEROBIC AND ANAEROBIC Blood Culture adequate volume   Culture   Final    NO GROWTH < 12 HOURS Performed at [facility], [address] [place], [place] [address]    Report Status PENDING  Incomplete  Blood culture (routine x 2)  Status: None (Preliminary result)   Collection Time: 10/04/22  3:23 AM   Specimen: BLOOD LEFT HAND  Result Value Ref Range Status   Specimen Description BLOOD LEFT HAND  Final   Special Requests   Final    BOTTLES DRAWN AEROBIC AND ANAEROBIC Blood Culture adequate volume   Culture   Final    NO GROWTH < 12 HOURS Performed at [facility], [address] [place], [place] [address]    Report Status PENDING  Incomplete  MRSA Next Gen by PCR, Nasal     Status: None   Collection Time: 10/04/22  1:03 PM   Specimen: Nasal Mucosa; Nasal Swab  Result Value Ref Range Status   MRSA by PCR Next Gen NOT DETECTED NOT DETECTED Final    Comment: (NOTE) The GeneXpert MRSA Assay (FDA approved for NASAL specimens only), is one component of a comprehensive MRSA  colonization surveillance program. It is not intended to diagnose MRSA infection nor to guide or monitor treatment for MRSA infections. Test performance is not FDA approved in patients less than 36 years old. Performed at [facility], [address] [place], [place] [address]      Labs: Basic Metabolic Panel: Recent Labs  Lab 10/04/22 0224  NA 134*  K 4.0  CL 97*  CO2 23  GLUCOSE 131*  BUN 24*  CREATININE 1.21*  CALCIUM 9.2   Liver Function Tests: Recent Labs  Lab 10/04/22 0224  AST 51*  ALT 20  ALKPHOS 444*  BILITOT 2.3*  PROT 8.0  ALBUMIN 3.4*   Recent Labs  Lab 10/04/22 0224  LIPASE 37   No results for input(s): "AMMONIA" in the last 168 hours. CBC: Recent Labs  Lab 10/04/22 0224  WBC 23.2*  NEUTROABS 17.0*  HGB 12.6  HCT 39.9  MCV 89.9  PLT 200   Cardiac Enzymes: Recent Labs  Lab 10/04/22 0224  CKTOTAL 68   BNP: BNP (last 3 results) Recent Labs    05/20/22 0914 09/11/22 0730  BNP 132.3* 67.2    ProBNP (last 3 results) No results for input(s): "PROBNP" in the last 8760 hours.  CBG: Recent Labs  Lab 10/04/22 0156  GLUCAP 128*    Time spent: > 30 minutes were spent in preparing this discharge including medication reconciliation, counseling, and coordination of care.  Signed:  Tunisha Ruland Neva Seat, MD  [facility] 10/04/2022, 5:04 PM

## 2022-10-04 NOTE — Assessment & Plan Note (Signed)
Renal function at baseline 

## 2022-10-04 NOTE — Procedures (Signed)
PROCEDURE SUMMARY:  Successful US guided diagnostic and therapeutic right thoracentesis. Yielded 900 cc of dark brown fluid. Pt tolerated procedure well. No immediate complications.  Specimen was sent for labs. CXR ordered.  EBL < 1 mL  Tyson Alias, AGNP 10/04/2022 10:32 AM

## 2022-10-04 NOTE — Sepsis Progress Note (Signed)
Elink following for Sepsis Protocol 

## 2022-10-04 NOTE — Assessment & Plan Note (Signed)
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

## 2022-10-04 NOTE — ED Notes (Signed)
Report called to Nash Mantis at [facility]

## 2022-10-04 NOTE — ED Provider Notes (Signed)
[facility] Provider Note    Event Date/Time   First MD Initiated Contact with Patient 10/04/22 0201     (approximate)   History   Leg Pain and Lung Cancer  EM caveat: Confusion, altered mental status.  Daughter provides history as well  HPI  Vanessa Carlson is a 62 y.o. female with a history of mechanical heart valve, ICD, heart failure, lung cancer multiple other, chronic medical conditions  She was recently admitted to the hospital.  She had a follow-up with her primary care doctor on February 2 for "hospitalization follow-up" she was also seen for supratherapeutic INR also noted to have multiloculated pleural effusion, lung cancer  Daughter reports patient was recently hospitalized currently undergoing cancer treatments.  Has been having some confusion and generally doing somewhat poorly over the last several days since discharge, but seems to be getting worse with regard to some confusion.  No slurred speech no facial droop, but seems like she has been increasingly confused over the last day and evening  EMS reports patient with hypoxia and tachycardia.  Patient reports only having pain in her back.  Patient and her daughter reports that back pain has been present since she had a fluid removed and drained from the lung, she has consistently complained of that pain and does not seem to be new in the last day     Physical Exam   Triage Vital Signs: ED Triage Vitals  Enc Vitals Group     BP 10/04/22 0158 (!) 151/112     Pulse Rate 10/04/22 0158 (!) 132     Resp 10/04/22 0158 (!) 25     Temp 10/04/22 0158 97.6 F (36.4 C)     Temp Source 10/04/22 0158 Oral     SpO2 10/04/22 0158 (!) 86 %     Weight --      Height --      Head Circumference --      Peak Flow --      Pain Score 10/04/22 0204 0     Pain Loc --      Pain Edu? --      Excl. in [place]? --     Most recent vital signs: Vitals:   10/04/22 0230 10/04/22 0415  BP: (!) 163/100 (!)  149/88  Pulse: (!) 132 (!) 126  Resp: (!) 31 (!) 24  Temp:    SpO2: 98% 100%     General: Awake, no distress.  She does appear chronically ill however.  She is notably tachycardic CV:  Good peripheral perfusion.  Tachycardia.  Some irregularity.  No murmur.  Mechanical sounding valve Resp:  Normal effort.  Very mild tachypnea.  Lung sounds diminished in the bases bilaterally.  No wheezing or rales no crackles.  Oxygen saturation 95% on 2 L Abd:  No distention.  Soft nontender nondistended Other:  Patient able to roll on her side examiner back her back appears atraumatic there is no obvious focal tenderness to palpation or lesion noted over the back   ED Results / Procedures / Treatments   Labs (all labs ordered are listed, but only abnormal results are displayed) Labs Reviewed  CBC WITH DIFFERENTIAL/PLATELET - Abnormal; Notable for the following components:      Result Value   WBC 23.2 (*)    RDW 19.0 (*)    nRBC 0.3 (*)    Neutro Abs 17.0 (*)    Monocytes Absolute 1.9 (*)    Eosinophils Absolute  1.7 (*)    Abs Immature Granulocytes 0.53 (*)    All other components within normal limits  COMPREHENSIVE METABOLIC PANEL - Abnormal; Notable for the following components:   Sodium 134 (*)    Chloride 97 (*)    Glucose, Bld 131 (*)    BUN 24 (*)    Creatinine, Ser 1.21 (*)    Albumin 3.4 (*)    AST 51 (*)    Alkaline Phosphatase 444 (*)    Total Bilirubin 2.3 (*)    GFR, Estimated 51 (*)    All other components within normal limits  LACTIC ACID, PLASMA - Abnormal; Notable for the following components:   Lactic Acid, Venous 2.2 (*)    All other components within normal limits  PROTIME-INR - Abnormal; Notable for the following components:   Prothrombin Time 28.1 (*)    INR 2.7 (*)    All other components within normal limits  CBG MONITORING, ED - Abnormal; Notable for the following components:   Glucose-Capillary 128 (*)    All other components within normal limits  TROPONIN  I (HIGH SENSITIVITY) - Abnormal; Notable for the following components:   Troponin I (High Sensitivity) 118 (*)    All other components within normal limits  RESP PANEL BY RT-PCR (FLU A&B, COVID) ARPGX2  CULTURE, BLOOD (ROUTINE X 2)  CULTURE, BLOOD (ROUTINE X 2)  LIPASE, BLOOD  TSH  CK  URINALYSIS, ROUTINE W REFLEX MICROSCOPIC  LACTIC ACID, PLASMA     EKG  Interpreted by me at 2 AM heart rate 135 QRS 80 QTc 440 Sinus tachycardia, mild nonspecific T wave abnormality.  No STEMI.  Patient denies having active chest pain.  She is however slightly confused.  Troponin is elevated I suspect likely demand ischemia after review of EKG, but certainly believe we also need to exclude pulmonary embolism and thus CT angio has been ordered   RADIOLOGY  Chest x-ray interpreted by me as a large right-sided pleural effusion difficult to exclude underlying consolidation.  Will further delineate with CT PE study  CT Head Wo Contrast  Result Date: 10/04/2022 CLINICAL DATA:  Altered mental status EXAM: CT HEAD WITHOUT CONTRAST TECHNIQUE: Contiguous axial images were obtained from the base of the skull through the vertex without intravenous contrast. RADIATION DOSE REDUCTION: This exam was performed according to the departmental dose-optimization program which includes automated exposure control, adjustment of the mA and/or kV according to patient size and/or use of iterative reconstruction technique. COMPARISON:  None Available. FINDINGS: Brain: There are numerous (at least 25) hyperdense lesions scattered throughout the brain. The largest abuts the right lateral ventricle and measures 10 mm. There is no edema. Vascular: No abnormal hyperdensity of the major intracranial arteries or dural venous sinuses. No intracranial atherosclerosis. Skull: The visualized skull base, calvarium and extracranial soft tissues are normal. Sinuses/Orbits: No fluid levels or advanced mucosal thickening of the visualized paranasal  sinuses. No mastoid or middle ear effusion. The orbits are normal. 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 These are likely hemorrhagic, but acuity is uncertain. Critical Value/emergent results were called by telephone at the time of interpretation on 10/04/2022 at 4:13 am to provider Laketha Leopard , who verbally acknowledged these results. Electronically Signed   By: Ulyses Jarred M.D.   On: 10/04/2022 04:13   DG Chest Portable 1 View  Result Date: 10/04/2022 CLINICAL DATA:  Hypoxia EXAM: PORTABLE CHEST 1 VIEW COMPARISON:  09/13/2022 FINDINGS: Unchanged position of left chest wall  AICD. There is a right chest wall power-injectable Port-A-Cath with tip at the cavoatrial junction via a right internal jugular vein approach. Large right pleural effusion with near complete opacification of the right hemithorax, worsened. Left lung is clear. Cardiomediastinal contours are normal. Remote median sternotomy and valvuloplasty. IMPRESSION: Large right pleural effusion with near complete opacification of the right hemithorax, worsened from prior. Electronically Signed   By: Ulyses Jarred M.D.   On: 10/04/2022 02:54      PROCEDURES:  Critical Care performed: No  Procedures   MEDICATIONS ORDERED IN ED: Medications  vancomycin (VANCOCIN) IVPB 1000 mg/200 mL premix (has no administration in time range)  ceFEPIme (MAXIPIME) 2 g in sodium chloride 0.9 % 100 mL IVPB (has no administration in time range)  fentaNYL (SUBLIMAZE) injection 25 mcg (25 mcg Intravenous Given 10/04/22 0323)  iohexol (OMNIPAQUE) 350 MG/ML injection 75 mL (75 mLs Intravenous Contrast Given 10/04/22 0351)     IMPRESSION / MDM / ASSESSMENT AND PLAN / ED COURSE  I reviewed the triage vital signs and the nursing notes.                              Differential diagnosis includes, but is not limited to, possible acute anemia, pulmonary embolism, dissection, pneumothorax, hemothorax,  recurrent pleural effusion, electrolyte abnormality, intracranial hemorrhage, stroke tumor or other causation.  Her differential is extremely broad given the complex city of her most recent medical condition.  Patient's presentation is most consistent with acute presentation with potential threat to life or bodily function.  ----------------------------------------- 4:22 AM on 10/04/2022 ----------------------------------------- Discussed case with Dr. Tasia Catchings of oncology, advising that given the patient's therapeutic INR and findings on head CT at this time oncology advised they will see the patient this morning for more formal consult, but they do not recommend reversal of anticoagulation in the setting of mechanical heart valve and somewhat perhaps subacute findings on the head CT.  In part, I question if some of this confusion it has been ongoing for a couple weeks could be related to potential hemorrhage that may not have been detected  Patient and her daughter both updated on need for admission.  Currently awaiting consult with her hospitalist Dr.Duncan , Patient is alert, sitting up, conversing with her daughter, and does not appear in acute extremis.  I anticipate she may also need thoracentesis.  Awaiting CT PE study results.  I did discuss with interventional radiology Dr. Maryelizabeth Kaufmann, and he advises that interventional radiology team will be available at 8 AM if thoracentesis is required.    The patient is on the cardiac monitor to evaluate for evidence of arrhythmia and/or significant heart rate changes.  ----------------------------------------- 4:35 AM on 10/04/2022 ----------------------------------------- Consulted with the hospitalist, patient will be admitted to the care and service of Dr. Damita Dunnings.  At this juncture will initiate healthcare associated pneumonia treatment after reviewing her CT that demonstrates consolidation versus collapse.  Large pleural effusion.  Interventional  radiology will be available if requested for thoracentesis.  Will be awaiting consult with Dr. Janese Banks       FINAL CLINICAL IMPRESSION(S) / ED DIAGNOSES   Final diagnoses:  Multiple lesions on computed tomography of brain and spine  Recurrent pleural effusion on right  SIRS (systemic inflammatory response syndrome) (HCC)  HCAP (healthcare-associated pneumonia)  Sepsis, due to unspecified organism, unspecified whether acute organ dysfunction present [facility])     Rx / DC Orders   ED Discharge  Orders     None        Note:  This document was prepared using Dragon voice recognition software and may include unintentional dictation errors.   Delman Kitten, MD 10/04/22 [phone]

## 2022-10-04 NOTE — Assessment & Plan Note (Signed)
Suspect hospital-acquired pneumonia Pleural effusion on right, Acute respiratory failure with hypoxia Sepsis fluids Cefepime vancomycin and azithromycin Supplemental oxygen, albuterol as needed, antitussives IR consult for thoracentesis with cultures

## 2022-10-04 NOTE — H&P (Signed)
History and Physical    Patient: Vanessa Carlson CBS:[id number] DOB: Oct 19, 1960 DOA: 10/04/2022 DOS: the patient was seen and examined on 10/04/2022 PCP: Vanessa Crews, MD  Patient coming from: Home  Chief Complaint:  Chief Complaint  Patient presents with   Leg Pain   Lung Cancer    HPI: Vanessa Carlson is a 62 y.o. female with medical history significant for CAD, ischemic cardiomyopathy status post AICD placement, rheumatic heart disease status post mechanical mitral valve replacement on Coumadin diagnosed with recurrent right lung cancer in January 2024 after presenting with a large pleural effusion that required thoracentesis, who was brought to the ED with a several day history of confusion and overall decline.  Patient complains of back pain that has been ongoing since her thoracentesis.  She has had no fever, vomiting or diarrhea.  EMS reports that patient was hypoxic and tachycardic. ED course and data review: BP 151/112 with pulse 132, respirations 25 and O2 sat 86% on room air.  Labs significant for WBC of 23,000 with lactic acid 2.2.  INR 2.7.Creatinine at baseline at 1.21.  Troponin 118.  Respiratory viral panel negative for COVID flu and influenza.  CTA chest negative for PE but showing known right upper lobe malignancy with osseous pulmonary and possibly hepatic metastatic spread with continued large right pleural effusion and lower lobe collapse.  CT head showed numerous hypodense metastases scattered throughout the brain without significant edema, likely hemorrhagic but acuity uncertain. The ED provider: Spoke with oncologist, Dr. Tasia Catchings who recommended continuing Coumadin given mechanical heart valve and Dr. Janese Banks will see in the a.m.  Hospitalist consulted for admission.   Review of Systems: As mentioned in the history of present illness. All other systems reviewed and are negative.  Past Medical History:  Diagnosis Date   AICD (automatic cardioverter/defibrillator) present     Anterior myocardial infarction [facility])    a. 10/2014 - occluded LAD, complicated by cardiogenic shock-->Med Rx as interventional team was unable to open LAD.   Arthritis    arthirits of knee on right   Cancer of upper lobe of right lung ([place]) 09/2017   radiation therapy right side   Cervical cancer (HCC)    in remission for ~20 yrs, s/p hysterectomy   Chronic combined systolic and diastolic CHF (congestive heart failure) ([place])    a. 10/2015 Echo: EF 20-25%, sev diast dysfxn, mildly reduced RV fxn. Nl MV prosthesis; b. 12/2017 Echo: EF 20-25%, diff HK. Sev ant/antsept HK, apical AK. Mild MS (mean graad 84mmHg). Mod TR. PASP 26mmHg.   Coronary artery disease    a. 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   Essential hypertension    Ischemic cardiomyopathy    a. 03/2015 s/p MDT single lead AICD;  b. 10/2015 Echo: EF 20-25%;  c. 12/2017 Echo: EF 20-25%, diff HK.   Mass of upper lobe of right lung    a. noted on CXR & CT 08/2017 w/ abnl PET CT.   Mitral valve disease    a. 2006 s/p MVR w/ 55mm SJM bileaflet mechanical valve-->chronic coumadin/ASA; b. 12/2017 Echo: Mild MS. Mean gradient 70mmHg.   Personal history of radiation therapy    f/u lung ca   Past Surgical History:  Procedure Laterality Date   APPENDECTOMY  1998   BREAST EXCISIONAL BIOPSY Left 80s   benign   CARDIAC CATHETERIZATION     CARDIAC DEFIBRILLATOR PLACEMENT  COLONOSCOPY WITH PROPOFOL N/A 01/11/2020   Procedure: COLONOSCOPY WITH PROPOFOL;  Surgeon: Jonathon Bellows, MD;  Location: [facility] ENDOSCOPY;  Service: Gastroenterology;  Laterality: N/A;   IR IMAGING GUIDED PORT INSERTION  06/29/2022   PORTA CATH INSERTION     RADICAL HYSTERECTOMY  1998   REPLACEMENT TOTAL KNEE Right 12/10/2021   SHOULDER SURGERY Bilateral    rotator cuff tears   VALVE REPLACEMENT     Mitral valve; 27 mm [employer] Jude bileaflet valve   Social History:  reports that she  quit smoking about 26 years ago. Her smoking use included cigarettes. She has a 4.00 pack-year smoking history. She has been exposed to tobacco smoke. She has never used smokeless tobacco. She reports that she does not drink alcohol and does not use drugs.  No Known Allergies  Family History  Problem Relation Age of Onset   Heart attack Mother    Hypertension Mother    Diabetes Mother    Emphysema Father        smoker   Diabetes Maternal Grandmother    Kidney disease Maternal Grandmother    Breast cancer Maternal Aunt    Breast cancer Maternal Aunt    Colon cancer Maternal Uncle     Prior to Admission medications   Medication Sig Start Date End Date Taking? Authorizing Provider  aspirin 81 MG chewable tablet Chew 81 mg by mouth daily.    [provider]  atorvastatin (LIPITOR) 80 MG tablet TAKE 1 TABLET BY MOUTH ONCE DAILY. OFFICE VISIT AND LABS NEEDED FOR FURTHER REFILLS. 08/31/22   Vanessa Crews, MD  docusate sodium (COLACE) 250 MG capsule Take 250 mg by mouth daily.    [provider]  ezetimibe (ZETIA) 10 MG tablet Take 1 tablet (10 mg total) by mouth daily. 07/12/22   End, Harrell Gave, MD  gabapentin (NEURONTIN) 300 MG capsule Take 1 capsule (300 mg total) by mouth 2 (two) times daily. 10/26/21   Bacigalupo, Dionne Bucy, MD  LORazepam (ATIVAN) 0.5 MG tablet Take 1 tablet (0.5 mg total) by mouth every 6 (six) hours as needed (nausea). 09/28/22   Sindy Guadeloupe, MD  metolazone (ZAROXOLYN) 2.5 MG tablet Take 1 tablet (2.5 mg) by mouth once a week. Take 30 minutes prior to your furosemide dose. 05/20/22   End, Harrell Gave, MD  metoprolol succinate (TOPROL-XL) 25 MG 24 hr tablet TAKE 1 TABLET BY MOUTH TWICE DAILY IN THE MORNING AND AT BEDTIME 06/21/22   End, Harrell Gave, MD  midodrine (PROAMATINE) 10 MG tablet Take 1 tablet (10 mg total) by mouth 3 (three) times daily with meals. 09/16/22   Nsiah, Oris Drone, MD  OLANZapine (ZYPREXA) 5 MG tablet Take 1 tablet (5 mg total)  by mouth at bedtime. 09/30/22   Borders, Kirt Boys, NP  ondansetron (ZOFRAN) 4 MG tablet Take 1 tablet (4 mg total) by mouth every 8 (eight) hours as needed for nausea or vomiting. 09/28/22   Sindy Guadeloupe, MD  oxybutynin (DITROPAN-XL) 10 MG 24 hr tablet Take 1 tablet (10 mg total) by mouth daily. 06/21/22   MacDiarmid, Nicki Reaper, MD  oxyCODONE (OXY IR/ROXICODONE) 5 MG immediate release tablet Take 1 tablet (5 mg total) by mouth every 4 (four) hours as needed for severe pain. 10/01/22   Bacigalupo, Dionne Bucy, MD  potassium chloride SA (KLOR-CON M) 20 MEQ tablet Take 2 tablets (40 mEq total) by mouth daily. 09/16/22   Adan Sis, MD  predniSONE (DELTASONE) 10 MG tablet Take 1 tablet (10 mg total)  by mouth daily with breakfast. 09/20/22   Sindy Guadeloupe, MD  warfarin (COUMADIN) 2 MG tablet Take 1 tablet (2 mg total) by mouth daily at 4 PM. 09/16/22   Adan Sis, MD    Physical Exam: Vitals:   10/04/22 0158 10/04/22 0200 10/04/22 0230 10/04/22 0415  BP: (!) 151/112 (!) 174/118 (!) 163/100 (!) 149/88  Pulse: (!) 132 (!) 133 (!) 132 (!) 126  Resp: (!) 25 (!) 23 (!) 31 (!) 24  Temp: 97.6 F (36.4 C)     TempSrc: Oral     SpO2: (!) 86% 95% 98% 100%   Physical Exam Vitals and nursing note reviewed.  Constitutional:      General: She is not in acute distress.    Comments: Mechele Claude female, in discomfort related to pain  HENT:     Head: Normocephalic and atraumatic.  Cardiovascular:     Rate and Rhythm: Normal rate and regular rhythm.     Heart sounds: Normal heart sounds.  Pulmonary:     Effort: Pulmonary effort is normal.     Breath sounds: Normal breath sounds.  Abdominal:     Palpations: Abdomen is soft.     Tenderness: There is no abdominal tenderness.  Neurological:     General: No focal deficit present.     Labs on Admission: I have personally reviewed following labs and imaging studies  CBC: Recent Labs  Lab 09/27/22 1002 10/04/22 0224  WBC 15.1* 23.2*  NEUTROABS  11.4* 17.0*  HGB 13.1 12.6  HCT 40.7 39.9  MCV 89.5 89.9  PLT 226 081   Basic Metabolic Panel: Recent Labs  Lab 09/27/22 1002 10/04/22 0224  NA 135 134*  K 3.4* 4.0  CL 98 97*  CO2 24 23  GLUCOSE 174* 131*  BUN 29* 24*  CREATININE 1.30* 1.21*  CALCIUM 9.0 9.2   GFR: Estimated Creatinine Clearance: 50.2 mL/min (A) (by C-G formula based on SCr of 1.21 mg/dL (H)). Liver Function Tests: Recent Labs  Lab 09/27/22 1002 10/04/22 0224  AST 60* 51*  ALT 23 20  ALKPHOS 279* 444*  BILITOT 1.8* 2.3*  PROT 8.4* 8.0  ALBUMIN 3.9 3.4*   Recent Labs  Lab 10/04/22 0224  LIPASE 37   No results for input(s): "AMMONIA" in the last 168 hours. Coagulation Profile: Recent Labs  Lab 09/29/22 1618 10/04/22 0224  INR 6.8* 2.7*   Cardiac Enzymes: Recent Labs  Lab 10/04/22 0224  CKTOTAL 68   BNP (last 3 results) No results for input(s): "PROBNP" in the last 8760 hours. HbA1C: No results for input(s): "HGBA1C" in the last 72 hours. CBG: Recent Labs  Lab 10/04/22 0156  GLUCAP 128*   Lipid Profile: No results for input(s): "CHOL", "HDL", "LDLCALC", "TRIG", "CHOLHDL", "LDLDIRECT" in the last 72 hours. Thyroid Function Tests: Recent Labs    10/04/22 0224  TSH 3.915   Anemia Panel: No results for input(s): "VITAMINB12", "FOLATE", "FERRITIN", "TIBC", "IRON", "RETICCTPCT" in the last 72 hours. Urine analysis:    Component Value Date/Time   COLORURINE YELLOW (A) 09/11/2022 0622   APPEARANCEUR HAZY (A) 09/11/2022 0622   APPEARANCEUR Clear 06/21/2022 1101   LABSPEC 1.017 09/11/2022 0622   PHURINE 5.0 09/11/2022 0622   GLUCOSEU NEGATIVE 09/11/2022 0622   HGBUR NEGATIVE 09/11/2022 0622   BILIRUBINUR NEGATIVE 09/11/2022 0622   BILIRUBINUR Negative 06/21/2022 1101   KETONESUR NEGATIVE 09/11/2022 0622   PROTEINUR 30 (A) 09/11/2022 0622   NITRITE NEGATIVE 09/11/2022 0622   LEUKOCYTESUR NEGATIVE 09/11/2022 0622  Radiological Exams on Admission: CT Angio Chest PE W  and/or Wo Contrast  Result Date: 10/04/2022 CLINICAL DATA:  Pulmonary embolism suspected EXAM: CT ANGIOGRAPHY CHEST WITH CONTRAST TECHNIQUE: Multidetector CT imaging of the chest was performed using the standard protocol during bolus administration of intravenous contrast. Multiplanar CT image reconstructions and MIPs were obtained to evaluate the vascular anatomy. RADIATION DOSE REDUCTION: This exam was performed according to the departmental dose-optimization program which includes automated exposure control, adjustment of the mA and/or kV according to patient size and/or use of iterative reconstruction technique. CONTRAST:  90mL OMNIPAQUE IOHEXOL 350 MG/ML SOLN COMPARISON:  Chest CT 09/10/2022 FINDINGS: Cardiovascular: Satisfactory opacification of the pulmonary arteries to the segmental level. No evidence of pulmonary embolism. Cardiomegaly with thinning of the anterior wall and apex of the left ventricle where there is faint calcification. Mitral valve replacement. Coronary atherosclerosis Mediastinum/Nodes: Right paratracheal adenopathy with rounded 12 mm node similar to prior. No hematoma. Lungs/Pleura: Right apical mass with borders poorly defined by arterial timing, ~ 7 cm in size with fracturing/erosion of lateral right second and third ribs. Large right pleural effusion which appears complicated with layering debris. The right lower lobe is collapsed. Patchy nodules in the left lung especially the upper lobe, also seen on prior. Upper Abdomen: No acute finding. Ill-defined medial segment right liver lesion measuring approximately 1 cm. Musculoskeletal: Eroded anterior to lateral right second and third ribs which are closely approximating the right upper lobe mass. Diffuse heterogeneity of the spine attributed to widespread metastases. Review of the MIP images confirms the above findings. IMPRESSION: 1. Negative for pulmonary embolism. 2. Known right upper lobe malignancy with osseous, pulmonary, and  possibly hepatic metastatic spread. Continued large right pleural effusion with lower lobe collapse. Electronically Signed   By: Jorje Guild M.D.   On: 10/04/2022 04:21   CT Head Wo Contrast  Result Date: 10/04/2022 CLINICAL DATA:  Altered mental status EXAM: CT HEAD WITHOUT CONTRAST TECHNIQUE: Contiguous axial images were obtained from the base of the skull through the vertex without intravenous contrast. RADIATION DOSE REDUCTION: This exam was performed according to the departmental dose-optimization program which includes automated exposure control, adjustment of the mA and/or kV according to patient size and/or use of iterative reconstruction technique. COMPARISON:  None Available. FINDINGS: Brain: There are numerous (at least 25) hyperdense lesions scattered throughout the brain. The largest abuts the right lateral ventricle and measures 10 mm. There is no edema. Vascular: No abnormal hyperdensity of the major intracranial arteries or dural venous sinuses. No intracranial atherosclerosis. Skull: The visualized skull base, calvarium and extracranial soft tissues are normal. Sinuses/Orbits: No fluid levels or advanced mucosal thickening of the visualized paranasal sinuses. No mastoid or middle ear effusion. The orbits are normal. 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 These are likely hemorrhagic, but acuity is uncertain. Critical Value/emergent results were called by telephone at the time of interpretation on 10/04/2022 at 4:13 am to provider MARK QUALE , who verbally acknowledged these results. Electronically Signed   By: Ulyses Jarred M.D.   On: 10/04/2022 04:13   DG Chest Portable 1 View  Result Date: 10/04/2022 CLINICAL DATA:  Hypoxia EXAM: PORTABLE CHEST 1 VIEW COMPARISON:  09/13/2022 FINDINGS: Unchanged position of left chest wall AICD. There is a right chest wall power-injectable Port-A-Cath with tip at the cavoatrial junction via a  right internal jugular vein approach. Large right pleural effusion with near complete opacification of the right hemithorax, worsened. Left lung is clear.  Cardiomediastinal contours are normal. Remote median sternotomy and valvuloplasty. IMPRESSION: Large right pleural effusion with near complete opacification of the right hemithorax, worsened from prior. Electronically Signed   By: Ulyses Jarred M.D.   On: 10/04/2022 02:54     Data Reviewed: Relevant notes from primary care and specialist visits, past discharge summaries as available in EHR, including Care Everywhere. 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 Consider palliative care consult Can consider neurosurgery consult for additional recommendations Will start dexamethasone and Keppra Neurochecks  Severe sepsis (HCC) Suspect hospital-acquired pneumonia Pleural effusion on right, Acute respiratory failure with hypoxia Sepsis fluids Cefepime vancomycin and azithromycin Supplemental oxygen, albuterol as needed, antitussives IR consult for thoracentesis with cultures   Chronic anticoagulation History of mitral valve replacement with mechanical valve Patient is on Coumadin with therapeutic INR of 2.7 Complex case in the setting of hemorrhagic brain metastases Palliative care consult Consider cardiology consult Will hold Coumadin to decrease risk of brain hemorrhage pending cardiology consult  Chronic HFrEF (heart failure with reduced ejection fraction) (HCC) Ischemic cardiomyopathy Clinically euvolemic Continue metoprolol  Stage 3a chronic kidney disease ([place]) Renal  function at baseline        DVT prophylaxis: SCDs  Consults: IR  Advance Care Planning:   Code Status: Prior   Family Communication: daughter at bedside  Disposition Plan: Back to previous home environment  Severity of Illness: The appropriate patient status for this patient is INPATIENT. Inpatient status is judged to be reasonable and necessary in order to provide the required intensity of service to ensure the patient's safety. The patient's presenting symptoms, physical exam findings, and initial radiographic and laboratory data in the context of their chronic comorbidities is felt to place them at high risk for further clinical deterioration. Furthermore, it is not anticipated that the patient will be medically stable for discharge from the hospital within 2 midnights of admission.   * I certify that at the point of admission it is my clinical judgment that the patient will require inpatient hospital care spanning beyond 2 midnights from the point of admission due to high intensity of service, high risk for further deterioration and high frequency of surveillance required.*  Author: Athena Masse, MD 10/04/2022 4:59 AM  For on call review [email or web address].

## 2022-10-04 NOTE — Consult Note (Signed)
[place] at [facility] [phone] [phone]   Name: Vanessa Carlson Date: 10/04/2022 MRN: [id number]  DOB: July 13, 1961  Patient Care Team: Virginia Crews, MD as PCP - General (Family Medicine) End, Harrell Gave, MD as PCP - Cardiology (Cardiology) Sindy Guadeloupe, MD as Consulting Physician (Oncology) Sindy Guadeloupe, MD as Consulting Physician (Oncology) Noreene Filbert, MD as Consulting Physician (Radiation Oncology) Lovell Sheehan, MD as Consulting Physician (Orthopedic Surgery)    REASON FOR CONSULTATION: Vanessa Carlson is a 62 y.o. 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 Patient was started on single agent Keytruda with last treatment in December 2023, with treatments held due to progressive decline with weakness, poor oral intake, and nausea.  Patient was considering best supportive care/hospice.  She was admitted to the hospital on 10/04/2022 with altered mental status.  CT of the head showed numerous probably hemorrhagic metastases.  Palliative care was consulted to address goals.  SOCIAL HISTORY:     reports that she quit smoking about 26 years ago. Her smoking use included cigarettes. She has a 4.00 pack-year smoking history. She has been exposed to tobacco smoke. She has never used smokeless tobacco. She reports that she does not drink alcohol and does not use drugs.  Patient is married and lives at home with her husband and son.  She also has a daughter who is involved.  ADVANCE DIRECTIVES:  Does not have  CODE STATUS: Full code  PAST MEDICAL HISTORY: Past Medical History:  Diagnosis Date   AICD (automatic cardioverter/defibrillator) present    Anterior myocardial infarction ([place])    a. 10/2014 - occluded LAD, complicated by cardiogenic shock-->Med Rx as  interventional team was unable to open LAD.   Arthritis    arthirits of knee on right   Cancer of upper lobe of right lung ([place]) 09/2017   radiation therapy right side   Cervical cancer (HCC)    in remission for ~20 yrs, s/p hysterectomy   Chronic combined systolic and diastolic CHF (congestive heart failure) ([place])    a. 10/2015 Echo: EF 20-25%, sev diast dysfxn, mildly reduced RV fxn. Nl MV prosthesis; b. 12/2017 Echo: EF 20-25%, diff HK. Sev ant/antsept HK, apical AK. Mild MS (mean graad 62mmHg). Mod TR. PASP 15mmHg.   Coronary artery disease    a. 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   Essential hypertension    Ischemic cardiomyopathy    a. 03/2015 s/p MDT single lead AICD;  b. 10/2015 Echo: EF 20-25%;  c. 12/2017 Echo: EF 20-25%, diff HK.   Mass of upper lobe of right lung    a. noted on CXR & CT 08/2017 w/ abnl PET CT.   Mitral valve disease    a. 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 12/2017 Echo: Mild MS. Mean gradient 37mmHg.   Personal history of radiation therapy    f/u lung ca    PAST SURGICAL HISTORY:  Past Surgical History:  Procedure Laterality Date   APPENDECTOMY  1998   BREAST EXCISIONAL BIOPSY Left 80s   benign   CARDIAC CATHETERIZATION     CARDIAC DEFIBRILLATOR PLACEMENT     COLONOSCOPY WITH PROPOFOL N/A 01/11/2020   Procedure: COLONOSCOPY WITH PROPOFOL;  Surgeon: Jonathon Bellows, MD;  Location: [facility] ENDOSCOPY;  Service: Gastroenterology;  Laterality: N/A;   IR IMAGING GUIDED PORT INSERTION  06/29/2022   PORTA CATH INSERTION     RADICAL HYSTERECTOMY  1998   REPLACEMENT TOTAL KNEE Right 12/10/2021   SHOULDER SURGERY Bilateral    rotator cuff tears   VALVE REPLACEMENT     Mitral valve; 27 mm St. Jude bileaflet valve    HEMATOLOGY/ONCOLOGY HISTORY:  Oncology History  Lung cancer, primary, with metastasis from lung to other site, right  ([place])  06/08/2022 Initial Diagnosis   Primary lung adenocarcinoma, right ([place])   06/08/2022 Cancer Staging   Staging form: Lung, AJCC 8th Edition - Clinical stage from 06/08/2022: Stage IVA (cTX, cN2, pM1a) - Signed by Sindy Guadeloupe, MD on 06/08/2022   07/02/2022 - 09/10/2022 Chemotherapy   Patient is on Treatment Plan : LUNG NSCLC Pembrolizumab (200) q21d     10/04/2022 -  Chemotherapy   Patient is on Treatment Plan : LUNG Carboplatin (5) + Pemetrexed (500) + Pembrolizumab (200) D1 q21d Induction x 4 cycles / Maintenance Pemetrexed (500) + Pembrolizumab (200) D1 q21d       ALLERGIES:  has No Known Allergies.  MEDICATIONS:  Current Facility-Administered Medications  Medication Dose Route Frequency Provider Last Rate Last Admin   acetaminophen (TYLENOL) tablet 650 mg  650 mg Oral Q6H PRN Athena Masse, MD       Or   acetaminophen (TYLENOL) suppository 650 mg  650 mg Rectal Q6H PRN Athena Masse, MD       albuterol (PROVENTIL) (2.5 MG/3ML) 0.083% nebulizer solution 2.5 mg  2.5 mg Nebulization Q2H PRN Athena Masse, MD       ceFEPIme (MAXIPIME) 2 g in sodium chloride 0.9 % 100 mL IVPB  2 g Intravenous Q12H Athena Masse, MD       dexamethasone (DECADRON) injection 4 mg  4 mg Intravenous Q6H Athena Masse, MD   4 mg at 10/04/22 0534   HYDROmorphone (DILAUDID) injection 1 mg  1 mg Intravenous Q3H PRN Lucillie Garfinkel, MD   1 mg at 10/04/22 0740   lactated ringers infusion   Intravenous Continuous Athena Masse, MD 150 mL/hr at 10/04/22 0816 Infusion Verify at 10/04/22 0816   levETIRAcetam (KEPPRA) IVPB 500 mg/100 mL premix  500 mg Intravenous Q12H Athena Masse, MD   Stopped at 10/04/22 0557   ondansetron (ZOFRAN) tablet 4 mg  4 mg Oral Q6H PRN Athena Masse, MD       Or   ondansetron [facility]) injection 4 mg  4 mg Intravenous Q6H PRN Athena Masse, MD       oxyCODONE (Oxy IR/ROXICODONE) immediate release tablet 10 mg  10 mg Oral Q4H PRN Hollice Gong, Mir M, MD       [START  ON 10/05/2022] vancomycin (VANCOREADY) IVPB 750 mg/150 mL  750 mg Intravenous Q24H Dorothe Pea, RPH       warfarin (COUMADIN) tablet 2 mg  2 mg Oral ONCE-[address], [facility]       Warfarin - Pharmacist Dosing Inpatient   Does not apply q[address] [facility]       Current Outpatient Medications  Medication Sig Dispense Refill   aspirin 81 MG chewable tablet Chew 81 mg by mouth daily.     atorvastatin (LIPITOR) 80 MG tablet TAKE 1 TABLET BY MOUTH ONCE DAILY. OFFICE VISIT AND LABS NEEDED FOR FURTHER REFILLS. 90 tablet 3   docusate sodium (COLACE) 250 MG  capsule Take 250 mg by mouth daily.     ezetimibe (ZETIA) 10 MG tablet Take 1 tablet (10 mg total) by mouth daily. 30 tablet 2   gabapentin (NEURONTIN) 300 MG capsule Take 1 capsule (300 mg total) by mouth 2 (two) times daily. 180 capsule 3   LORazepam (ATIVAN) 0.5 MG tablet Take 1 tablet (0.5 mg total) by mouth every 6 (six) hours as needed (nausea). 30 tablet 0   metolazone (ZAROXOLYN) 2.5 MG tablet Take 1 tablet (2.5 mg) by mouth once a week. Take 30 minutes prior to your furosemide dose. 12 tablet 0   metoprolol succinate (TOPROL-XL) 25 MG 24 hr tablet TAKE 1 TABLET BY MOUTH TWICE DAILY IN THE MORNING AND AT BEDTIME 180 tablet 0   midodrine (PROAMATINE) 10 MG tablet Take 1 tablet (10 mg total) by mouth 3 (three) times daily with meals. 30 tablet 0   OLANZapine (ZYPREXA) 5 MG tablet Take 1 tablet (5 mg total) by mouth at bedtime. 10 tablet 0   ondansetron (ZOFRAN) 4 MG tablet Take 1 tablet (4 mg total) by mouth every 8 (eight) hours as needed for nausea or vomiting. 30 tablet 0   oxybutynin (DITROPAN-XL) 10 MG 24 hr tablet Take 1 tablet (10 mg total) by mouth daily. 30 tablet 11   oxyCODONE (OXY IR/ROXICODONE) 5 MG immediate release tablet Take 1 tablet (5 mg total) by mouth every 4 (four) hours as needed for severe pain. 30 tablet 0   oxyCODONE (OXY IR/ROXICODONE) 5 MG immediate release tablet Take 1 tablet (5 mg total) by  mouth every 4 (four) hours as needed for severe pain. 30 tablet 0   potassium chloride SA (KLOR-CON M) 20 MEQ tablet Take 2 tablets (40 mEq total) by mouth daily. 60 tablet 6   predniSONE (DELTASONE) 10 MG tablet Take 1 tablet (10 mg total) by mouth daily with breakfast. 5 tablet 0   warfarin (COUMADIN) 2 MG tablet Take 1 tablet (2 mg total) by mouth daily at 4 PM. 30 tablet 0    VITAL SIGNS: BP 127/85   Pulse (!) 108   Temp 97.9 F (36.6 C) (Axillary)   Resp 15   LMP  (LMP Unknown)   SpO2 98%  There were no vitals filed for this visit.  Estimated body mass index is 36.21 kg/m as calculated from the following:   Height as of 09/11/22: 5\' 2"  (1.575 m).   Weight as of 09/20/22: 198 lb (89.8 kg).  LABS: CBC:    Component Value Date/Time   WBC 23.2 (H) 10/04/2022 0224   HGB 12.6 10/04/2022 0224   HGB 12.3 08/18/2018 0957   HCT 39.9 10/04/2022 0224   HCT 38.0 08/18/2018 0957   PLT 200 10/04/2022 0224   PLT 80 (LL) 08/18/2018 0957   MCV 89.9 10/04/2022 0224   MCV 85 08/18/2018 0957   NEUTROABS 17.0 (H) 10/04/2022 0224   LYMPHSABS 2.0 10/04/2022 0224   MONOABS 1.9 (H) 10/04/2022 0224   EOSABS 1.7 (H) 10/04/2022 0224   BASOSABS 0.1 10/04/2022 0224   Comprehensive Metabolic Panel:    Component Value Date/Time   NA 134 (L) 10/04/2022 0224   NA 145 (H) 12/19/2019 1118   K 4.0 10/04/2022 0224   CL 97 (L) 10/04/2022 0224   CO2 23 10/04/2022 0224   BUN 24 (H) 10/04/2022 0224   BUN 14 12/19/2019 1118   CREATININE 1.21 (H) 10/04/2022 0224   CREATININE 1.14 (H) 07/08/2017 1127   GLUCOSE 131 (H) 10/04/2022 0224  CALCIUM 9.2 10/04/2022 0224   AST 51 (H) 10/04/2022 0224   ALT 20 10/04/2022 0224   ALKPHOS 444 (H) 10/04/2022 0224   BILITOT 2.3 (H) 10/04/2022 0224   BILITOT 0.6 08/18/2018 0957   PROT 8.0 10/04/2022 0224   PROT 7.0 08/18/2018 0957   ALBUMIN 3.4 (L) 10/04/2022 0224   ALBUMIN 4.2 08/18/2018 0957    RADIOGRAPHIC STUDIES: DG Chest Port 1 View  Result Date:  10/04/2022 CLINICAL DATA:  Recurrent right pleural effusion status post thoracentesis. EXAM: PORTABLE CHEST 1 VIEW COMPARISON:  10/04/2022 at 0238 hours and CT chest 10/04/2022. FINDINGS: Trachea is midline. Heart size grossly stable. Left ICD lead tip projects over the right ventricle. Right IJ Port-A-Cath tip is in the low SVC. Interval decrease in size of a loculated right pleural effusion, still moderate to large in size. No pneumothorax. Right upper lobe mass with perihilar collapse/consolidation better evaluated on CT chest performed earlier today. Left lung nodules better seen on today's CT. Minimal streaky atelectasis in the left lung base. Tiny left pleural effusion. IMPRESSION: 1. Moderate to large loculated right pleural effusion, slightly decreased in size from exam performed earlier today. No pneumothorax. 2. Right upper lobe mass, perihilar airspace consolidation and left lung nodules, better evaluated on today's CT chest. 3. Tiny left pleural effusion. Electronically Signed   By: Lorin Picket M.D.   On: 10/04/2022 09:48   CT Angio Chest PE W and/or Wo Contrast  Result Date: 10/04/2022 CLINICAL DATA:  Pulmonary embolism suspected EXAM: CT ANGIOGRAPHY CHEST WITH CONTRAST TECHNIQUE: Multidetector CT imaging of the chest was performed using the standard protocol during bolus administration of intravenous contrast. Multiplanar CT image reconstructions and MIPs were obtained to evaluate the vascular anatomy. RADIATION DOSE REDUCTION: This exam was performed according to the departmental dose-optimization program which includes automated exposure control, adjustment of the mA and/or kV according to patient size and/or use of iterative reconstruction technique. CONTRAST:  30mL OMNIPAQUE IOHEXOL 350 MG/ML SOLN COMPARISON:  Chest CT 09/10/2022 FINDINGS: Cardiovascular: Satisfactory opacification of the pulmonary arteries to the segmental level. No evidence of pulmonary embolism. Cardiomegaly with thinning  of the anterior wall and apex of the left ventricle where there is faint calcification. Mitral valve replacement. Coronary atherosclerosis Mediastinum/Nodes: Right paratracheal adenopathy with rounded 12 mm node similar to prior. No hematoma. Lungs/Pleura: Right apical mass with Lonisha Bobby poorly defined by arterial timing, ~ 7 cm in size with fracturing/erosion of lateral right second and third ribs. Large right pleural effusion which appears complicated with layering debris. The right lower lobe is collapsed. Patchy nodules in the left lung especially the upper lobe, also seen on prior. Upper Abdomen: No acute finding. Ill-defined medial segment right liver lesion measuring approximately 1 cm. Musculoskeletal: Eroded anterior to lateral right second and third ribs which are closely approximating the right upper lobe mass. Diffuse heterogeneity of the spine attributed to widespread metastases. Review of the MIP images confirms the above findings. IMPRESSION: 1. Negative for pulmonary embolism. 2. Known right upper lobe malignancy with osseous, pulmonary, and possibly hepatic metastatic spread. Continued large right pleural effusion with lower lobe collapse. Electronically Signed   By: Jorje Guild M.D.   On: 10/04/2022 04:21   CT Head Wo Contrast  Result Date: 10/04/2022 CLINICAL DATA:  Altered mental status EXAM: CT HEAD WITHOUT CONTRAST TECHNIQUE: Contiguous axial images were obtained from the base of the skull through the vertex without intravenous contrast. RADIATION DOSE REDUCTION: This exam was performed according to the departmental dose-optimization program  which includes automated exposure control, adjustment of the mA and/or kV according to patient size and/or use of iterative reconstruction technique. COMPARISON:  None Available. FINDINGS: Brain: There are numerous (at least 25) hyperdense lesions scattered throughout the brain. The largest abuts the right lateral ventricle and measures 10 mm. There  is no edema. Vascular: No abnormal hyperdensity of the major intracranial arteries or dural venous sinuses. No intracranial atherosclerosis. Skull: The visualized skull base, calvarium and extracranial soft tissues are normal. Sinuses/Orbits: No fluid levels or advanced mucosal thickening of the visualized paranasal sinuses. No mastoid or middle ear effusion. The orbits are normal. 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 These are likely hemorrhagic, but acuity is uncertain. Critical Value/emergent results were called by telephone at the time of interpretation on 10/04/2022 at 4:13 am to provider MARK QUALE , who verbally acknowledged these results. Electronically Signed   By: Ulyses Jarred M.D.   On: 10/04/2022 04:13   DG Chest Portable 1 View  Result Date: 10/04/2022 CLINICAL DATA:  Hypoxia EXAM: PORTABLE CHEST 1 VIEW COMPARISON:  09/13/2022 FINDINGS: Unchanged position of left chest wall AICD. There is a right chest wall power-injectable Port-A-Cath with tip at the cavoatrial junction via a right internal jugular vein approach. Large right pleural effusion with near complete opacification of the right hemithorax, worsened. Left lung is clear. Cardiomediastinal contours are normal. Remote median sternotomy and valvuloplasty. IMPRESSION: Large right pleural effusion with near complete opacification of the right hemithorax, worsened from prior. Electronically Signed   By: Ulyses Jarred M.D.   On: 10/04/2022 02:54   US THORACENTESIS ASP PLEURAL SPACE W/IMG GUIDE  Result Date: 09/13/2022 INDICATION: Patient with history of non-small cell lung cancer with recurrent right pleural effusion. Request received for therapeutic thoracentesis. EXAM: ULTRASOUND GUIDED THERAPEUTIC RIGHT THORACENTESIS MEDICATIONS: 10 mL 1 % lidocaine COMPLICATIONS: None immediate. PROCEDURE: An ultrasound guided thoracentesis was thoroughly discussed with the patient and questions  answered. The benefits, risks, alternatives and complications were also discussed. The patient understands and wishes to proceed with the procedure. Written consent was obtained. Ultrasound was performed to localize and mark an adequate pocket of fluid in the right chest. The area was then prepped and draped in the normal sterile fashion. 1% Lidocaine was used for local anesthesia. Under ultrasound guidance a 6 Fr Safe-T-Centesis catheter was introduced. Thoracentesis was performed. The catheter was removed and a dressing applied. FINDINGS: A total of approximately 1.3 L of dark, bloody fluid was removed. IMPRESSION: Successful ultrasound guided right thoracentesis yielding 1.3 L of pleural fluid. Read by: Narda Rutherford, AGNP-BC Electronically Signed   By: Miachel Roux M.D.   On: 09/13/2022 14:39   DG Chest Port 1 View  Result Date: 09/13/2022 CLINICAL DATA:  Post thora EXAM: PORTABLE CHEST - 1 VIEW COMPARISON:  CT 09/10/2022 FINDINGS: No pneumothorax. Persistent moderate right pleural effusion, with atelectasis/consolidation at the right lung base. Left lung clear. Stable right IJ port catheter to the distal SVC. Stable left subclavian AICD. Heart size and mediastinal contours are within normal limits. Post MVR. Sternotomy wires. IMPRESSION: Persistent moderate right pleural effusion. No pneumothorax post thoracentesis. Electronically Signed   By: Lucrezia Europe M.D.   On: 09/13/2022 14:11   ECHOCARDIOGRAM COMPLETE  Result Date: 09/11/2022    ECHOCARDIOGRAM REPORT   Patient Name:   DARIANE NATZKE Holleran Date of Exam: 09/11/2022 Medical Rec #:  [id number]         Height:       62.0  in Accession #:    [id number]        Weight:       203.9 lb Date of Birth:  1961-01-09          BSA:          1.928 m Patient Age:    86 years          BP:           93/62 mmHg Patient Gender: F                 HR:           83 bpm. Exam Location:  [facility] Procedure: 2D Echo and Intracardiac Opacification Agent Indications:     CHF I50.21   History:         Patient has prior history of Echocardiogram examinations, most                  recent 01/28/2022.                   Mitral Valve: 27 mm mechanical valve valve is present in the                  mitral position.  Sonographer:     Kathlen Brunswick RDCS Referring Phys:  [id number] [place] Diagnosing Phys: Eleonore Chiquito MD  Sonographer Comments: Technically difficult study due to poor echo windows and suboptimal subcostal window. Image acquisition challenging due to patient body habitus. IMPRESSIONS  1. Left ventricular ejection fraction, by estimation, is 20 to 25%. The left ventricle has severely decreased function. The left ventricle demonstrates regional wall motion abnormalities (see scoring diagram/findings for description). Left ventricular diastolic function could not be evaluated.  2. Right ventricular systolic function is normal. The right ventricular size is normal. There is normal pulmonary artery systolic pressure. The estimated right ventricular systolic pressure is 66.5 mmHg.  3. The mitral valve has been repaired/replaced. No evidence of mitral valve regurgitation. The mean mitral valve gradient is 2.0 mmHg with average heart rate of 85 bpm. There is a 27 mm mechanical valve present in the mitral position. Echo findings are consistent with normal structure and function of the mitral valve prosthesis.  4. The aortic valve is tricuspid. Aortic valve regurgitation is not visualized. No aortic stenosis is present.  5. The inferior vena cava is normal in size with greater than 50% respiratory variability, suggesting right atrial pressure of 3 mmHg. Comparison(s): No significant change from prior study. Conclusion(s)/Recommendation(s): Findings consistent with ischemic cardiomyopathy. No left ventricular mural or apical thrombus/thrombi. FINDINGS  Left Ventricle: Left ventricular ejection fraction, by estimation, is 20 to 25%. The left ventricle has severely decreased function. The  left ventricle demonstrates regional wall motion abnormalities. Definity contrast agent was given IV to delineate the left ventricular endocardial Matelyn Antonelli. The left ventricular internal cavity size was normal in size. There is no left ventricular hypertrophy. Left ventricular diastolic function could not be evaluated due to mitral valve replacement. Left ventricular  diastolic function could not be evaluated.  LV Wall Scoring: The mid and distal anterior septum, apical lateral segment, apical anterior segment, and apex are akinetic. The anterior wall, antero-lateral wall, entire inferior wall, posterior wall, basal anteroseptal segment, mid inferoseptal segment, and basal inferoseptal segment are hypokinetic. Right Ventricle: The right ventricular size is normal. No increase in right ventricular wall thickness. Right ventricular systolic function is normal. There is normal pulmonary artery systolic pressure. The tricuspid regurgitant velocity  is 2.68 m/s, and  with an assumed right atrial pressure of 3 mmHg, the estimated right ventricular systolic pressure is 30.0 mmHg. Left Atrium: Left atrial size was normal in size. Right Atrium: Right atrial size was normal in size. Pericardium: There is no evidence of pericardial effusion. Mitral Valve: The mitral valve has been repaired/replaced. No evidence of mitral valve regurgitation. There is a 27 mm mechanical valve present in the mitral position. Echo findings are consistent with normal structure and function of the mitral valve prosthesis. MV peak gradient, 4.8 mmHg. The mean mitral valve gradient is 2.0 mmHg with average heart rate of 85 bpm. Tricuspid Valve: The tricuspid valve is grossly normal. Tricuspid valve regurgitation is mild . No evidence of tricuspid stenosis. Aortic Valve: The aortic valve is tricuspid. Aortic valve regurgitation is not visualized. No aortic stenosis is present. Aortic valve peak gradient measures 6.2 mmHg. Pulmonic Valve: The pulmonic  valve was grossly normal. Pulmonic valve regurgitation is mild. No evidence of pulmonic stenosis. Aorta: The aortic root and ascending aorta are structurally normal, with no evidence of dilitation. Venous: The inferior vena cava is normal in size with greater than 50% respiratory variability, suggesting right atrial pressure of 3 mmHg. IAS/Shunts: The atrial septum is grossly normal. Additional Comments: A device lead is visualized in the right atrium and right ventricle.  LEFT VENTRICLE PLAX 2D LVIDd:         5.20 cm     Diastology LVIDs:         4.70 cm     LV e' medial:    6.53 cm/s LV PW:         0.80 cm     LV E/e' medial:  11.6 LV IVS:        0.80 cm     LV e' lateral:   8.16 cm/s LVOT diam:     1.65 cm     LV E/e' lateral: 9.3 LV SV:         28 LV SV Index:   14 LVOT Area:     2.14 cm  LV Volumes (MOD) LV vol d, MOD A2C: 98.8 ml LV vol d, MOD A4C: 94.4 ml LV vol s, MOD A2C: 49.1 ml LV vol s, MOD A4C: 63.6 ml LV SV MOD A2C:     49.8 ml LV SV MOD A4C:     94.4 ml LV SV MOD BP:      42.9 ml RIGHT VENTRICLE RV Basal diam:  2.20 cm LEFT ATRIUM             Index LA diam:        3.50 cm 1.82 cm/m LA Vol (A2C):   82.2 ml 42.64 ml/m LA Vol (A4C):   30.2 ml 15.67 ml/m LA Biplane Vol: 51.7 ml 26.82 ml/m  AORTIC VALVE                 PULMONIC VALVE AV Area (Vmax): 1.28 cm     PV Vmax:          0.90 m/s AV Vmax:        124.00 cm/s  PV Peak grad:     3.2 mmHg AV Peak Grad:   6.2 mmHg     PR End Diast Vel: 5.20 msec LVOT Vmax:      74.20 cm/s LVOT Vmean:     46.800 cm/s LVOT VTI:       0.130 m  AORTA Ao Root diam: 2.70 cm Ao Asc diam:  2.40 cm MITRAL VALVE                TRICUSPID VALVE MV Area (PHT): 3.27 cm     TR Peak grad:   28.7 mmHg MV Area VTI:   1.27 cm     TR Vmax:        268.00 cm/s MV Peak grad:  4.8 mmHg MV Mean grad:  2.0 mmHg     SHUNTS MV Vmax:       1.10 m/s     Systemic VTI:  0.13 m MV Vmean:      55.7 cm/s    Systemic Diam: 1.65 cm MV Decel Time: 232 msec MV E velocity: 75.50 cm/s MV A velocity:  112.00 cm/s MV E/A ratio:  0.67 Eleonore Chiquito MD Electronically signed by Eleonore Chiquito MD Signature Date/Time: 09/11/2022/5:40:25 PM    Final    CT CHEST ABDOMEN PELVIS WO CONTRAST  Result Date: 09/10/2022 CLINICAL DATA:  Non-small cell lung cancer, elevated INR EXAM: CT CHEST, ABDOMEN AND PELVIS WITHOUT CONTRAST TECHNIQUE: Multidetector CT imaging of the chest, abdomen and pelvis was performed following the standard protocol without IV contrast. Examination was performed without intravenous contrast at the request of the ordering physician. Evaluation of the soft tissues, vascular structures, and solid viscera is limited. RADIATION DOSE REDUCTION: This exam was performed according to the departmental dose-optimization program which includes automated exposure control, adjustment of the mA and/or kV according to patient size and/or use of iterative reconstruction technique. COMPARISON:  05/21/2022, 06/10/2022 FINDINGS: CT CHEST FINDINGS Cardiovascular: Stable cardiac pacer. Mitral valve prosthesis. No pericardial effusion. Normal caliber of the thoracic aorta. Stable atherosclerosis of the aorta and coronary vasculature. Mediastinum/Nodes: Thyroid, trachea, and esophagus are grossly unremarkable. 9 mm short axis pretracheal lymph node reference image 17/2, previously measuring approximately 12 mm. Evaluation of the mediastinum and hilar regions is limited due to the lack of intravenous contrast. Lungs/Pleura: Right upper lobe mass measures 6.9 x 5.0 cm reference image 15/2, previously measuring 6.4 x 4.6 cm, compatible with recurrent right upper lobe lung cancer. There is a large multiloculated right pleural effusion. Compressive atelectasis is seen within the right middle and right lower lobes, with minimal aeration superior segment right lower lobe. There is increase in the size and number of left lung pulmonary nodules seen on prior study, consistent with progressive metastatic disease. Index nodule left  upper lobe image 34/4 measures 12 x 8 mm, previously 6 x 5 mm. No left-sided effusion. No pneumothorax. Musculoskeletal: Stable right anterolateral second and third rib fractures. No new bony abnormalities. Reconstructed images demonstrate no additional findings. CT ABDOMEN PELVIS FINDINGS Hepatobiliary: Gallbladder sludge layering dependently within the gallbladder. No calcified gallstones or cholecystitis. Indeterminate 8 mm hypodensity inferior right lobe liver image 59/2, metastatic disease cannot be excluded. No biliary duct dilation. Pancreas: Unremarkable unenhanced appearance. Spleen: Unremarkable unenhanced appearance. Adrenals/Urinary Tract: No urinary tract calculi or obstructive uropathy. The adrenals and bladder are grossly unremarkable. Stomach/Bowel: No bowel obstruction or ileus. No bowel wall thickening or inflammatory change. Vascular/Lymphatic: Aortic atherosclerosis. No enlarged abdominal or pelvic lymph nodes. Reproductive: Prior hysterectomy.  No adnexal masses. Other: No free fluid or free intraperitoneal gas. No abdominal wall hernia. Musculoskeletal: No acute or destructive bony lesions. Reconstructed images demonstrate no additional findings. IMPRESSION: 1. Enlarging right upper lobe mass consistent with progressive lung cancer. 2. Large multiloculated right pleural effusion, increased in size since prior study. Significant compressive atelectasis within the right middle and right lower lobe, with only a  small portion of normally aerated lung within the superior segment right lower lobe. 3. New and enlarging left lung pulmonary nodules consistent with progressive metastatic disease. 4. Indeterminate 8 mm hypodensity inferior right lobe liver, not well seen on previous exams. Metastatic disease cannot be excluded. 5. Grossly stable borderline enlarged mediastinal adenopathy. 6. Gallbladder sludge. No evidence of cholelithiasis or cholecystitis. 7.  Aortic Atherosclerosis (ICD10-I70.0).  Electronically Signed   By: Randa Ngo M.D.   On: 09/10/2022 17:50    PERFORMANCE STATUS (ECOG) : 4 - Bedbound  Review of Systems Unable to complete  Physical Exam General: Ill-appearing Cardiovascular: regular rate and rhythm Pulmonary: clear ant fields Abdomen: soft, nontender, + bowel sounds GU: no suprapubic tenderness Extremities: no edema, no joint deformities Skin: no rashes Neurological: Opens eyes to stimulation but does not follow commands  IMPRESSION: Patient seen in the emergency department.  At present, patient has altered mental status and is unable to engage meaningfully in a conversation regarding goals.  She opens eyes to verbal stimulation but does not respond.  I met in person with patient's daughter and spoke with patient's husband by phone.  Together, we reviewed the workup with CT of the head suggestive of hemorrhagic brain metastases.  Patient is on warfarin, which cannot be discontinued due to mechanical valve.  We discussed options for further workup including MRI of the brain and possible whole brain radiation.  However, patient has had persistent decline in performance status and has previously communicated a desire to focus on comfort measures.  They were considering hospice involvement prior to the ED visit and would like to proceed with referral for hospice services.  Patient's daughter feels like patient would be best served at a hospice facility but husband is considering taking her home with hospice.  Will allow the hospice liaison to provide family with information on both home hospice and hospice IPU.  We discussed CODE STATUS.  I recommended DNR/DNI.  Daughter verbalized agreement with this but husband stated that he would prefer to wait until he arrives to the hospital prior to making a decision on CODE STATUS.  Of note, patient has an AICD and would benefit from future deactivation when she nears the dying process.  PLAN: -Recommend best supportive  care -Referral to hospice -Family discussing CODE STATUS  Case and plan discussed with Dr. Janese Banks  Time Total: 60 minutes  Visit consisted of counseling and education dealing with the complex and emotionally intense issues of symptom management and palliative care in the setting of serious and potentially life-threatening illness.Greater than 50%  of this time was spent counseling and coordinating care related to the above assessment and plan.  Signed by: Altha Harm, PhD, NP-C

## 2022-10-04 NOTE — Assessment & Plan Note (Signed)
Ischemic cardiomyopathy Clinically euvolemic Continue metoprolol

## 2022-10-04 NOTE — ED Triage Notes (Signed)
Patient BIB EMS for evaluation of bilateral leg pain.  Hx of lung cancer and was receiving treatment "up until three weeks ago when family was concerned for cognitive decline."  Family concerned that patient is becoming increasingly confused.  Worse since started CA treatment.  No reports of recent illness.  Pt slow to answer questions, but is currently alert and oriented.  Has no current complaints at this time

## 2022-10-04 NOTE — Progress Notes (Addendum)
Barrister's clerk   New referral to discuss hospice services received from [place], SW TOC.  I met with the patient and her husband Tosin at the bedside, with her daughter on speaker phone.  Discussed hospice benefit and hospice philosophy at length.  Discussed the difference between hospice services and hospice services at an [facility].  They would like to proceed with referral for hospice home and see if she meets criteria.  Per. Dr. Antonieta Loveless, hospice MD, patient meets criteria for GIP level of care with uncontrolled pain and shortness of breath.  Bed offer made and accepted by patient and family.  Plan for transfer to the hospice home tonight with EMS to transport patient at 2100.  Husband will complete consents via docusign with Cydney Ok, hospice home SW.  Patient has AICD.  Will plan on having deactivated tomorrow at [facility].  Discharge summary to be sent to hospice home by this RN.  Report to be called by EDRN to hospice home.  Number provided.  Thank you for allowing participation in this patient's care.  Dimas Aguas, RN Nurse Liaison [phone]

## 2022-10-04 NOTE — Consult Note (Signed)
Pharmacy Antibiotic Note  Vanessa Carlson is a 61 y.o. female admitted on 10/04/2022 with AMS and leg pain. Patient with history of mechanical heart valve and lung cancer- last keytruda treatment 08/20/22. Recent hospitalization and treatment for pleural effusion.  Pharmacy has been consulted for vancomycin and cefepime dosing for suspected PNA with recurrent pleural effusion  Plan: Initiate cefepime 2 gram Q12H Vancomycin 2000 mg x 1 given in ED.  Initiate Vancomycin 750 mg Q24H. Goal AUC 400-550 Estimated AUC 463/Cmin: 12.6 Scr 1.21, IBW, Vd 0.5 (BMI 36) MRSA PCR ordered       Temp (24hrs), Avg:97.6 F (36.4 C), Min:97.6 F (36.4 C), Max:97.6 F (36.4 C)  Recent Labs  Lab 09/27/22 1002 10/04/22 0224  WBC 15.1* 23.2*  CREATININE 1.30* 1.21*  LATICACIDVEN  --  2.2*    Estimated Creatinine Clearance: 50.2 mL/min (A) (by C-G formula based on SCr of 1.21 mg/dL (H)).    No Known Allergies  Antimicrobials this admission: 2/5 cefepime >>  2/5 Vancomycin >>   Dose adjustments this admission:   Microbiology results: 2/5 BCx: sent 2/5 MRSA PCR: ordered   Thank you for allowing pharmacy to be a part of this patient's care.  Dorothe Pea, PharmD, BCPS Clinical Pharmacist   10/04/2022 5:23 AM

## 2022-10-04 NOTE — Assessment & Plan Note (Signed)
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

## 2022-10-04 NOTE — Progress Notes (Deleted)
Cardiology Office Note:    Date:  10/04/2022   ID:  Vanessa Carlson, DOB 1961-02-03, MRN [id number]  PCP:  Virginia Crews, MD  [facility] Cardiologist:  [name], MD  [facility] Electrophysiologist:  None   Referring MD: Virginia Crews, MD   Chief Complaint: 3 month follow-up  History of Present Illness:    Vanessa Carlson is a 62 y.o. female with a hx of ***  Past Medical History:  Diagnosis Date   AICD (automatic cardioverter/defibrillator) present    Anterior myocardial infarction ([place])    a. 10/2014 - occluded LAD, complicated by cardiogenic shock-->Med Rx as interventional team was unable to open LAD.   Arthritis    arthirits of knee on right   Cancer of upper lobe of right lung ([place]) 09/2017   radiation therapy right side   Cervical cancer (HCC)    in remission for ~20 yrs, s/p hysterectomy   Chronic combined systolic and diastolic CHF (congestive heart failure) ([place])    a. 10/2015 Echo: EF 20-25%, sev diast dysfxn, mildly reduced RV fxn. Nl MV prosthesis; b. 12/2017 Echo: EF 20-25%, diff HK. Sev ant/antsept HK, apical AK. Mild MS (mean graad 24mHg). Mod TR. PASP 417mg.   Coronary artery disease    a. 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   Essential hypertension    Ischemic cardiomyopathy    a. 03/2015 s/p MDT single lead AICD;  b. 10/2015 Echo: EF 20-25%;  c. 12/2017 Echo: EF 20-25%, diff HK.   Mass of upper lobe of right lung    a. noted on CXR & CT 08/2017 w/ abnl PET CT.   Mitral valve disease    a. 2006 s/p MVR w/ [id number] bileaflet mechanical valve-->chronic coumadin/ASA; b. 12/2017 Echo: Mild MS. Mean gradient 4mm9m   Personal history of radiation therapy    f/u lung ca    Past Surgical History:  Procedure Laterality Date   APPENDECTOMY  1998   BREAST EXCISIONAL BIOPSY Left 80s   benign   CARDIAC CATHETERIZATION     CARDIAC DEFIBRILLATOR  PLACEMENT     COLONOSCOPY WITH PROPOFOL N/A 01/11/2020   Procedure: COLONOSCOPY WITH PROPOFOL;  Surgeon: AnnaJonathon Bellows;  [facility];  Service: Gastroenterology;  Laterality: N/A;   IR IMAGING GUIDED PORT INSERTION  06/29/2022   PORTA CATH INSERTION     RADICAL HYSTERECTOMY  1998   REPLACEMENT TOTAL KNEE Right 12/10/2021   SHOULDER SURGERY Bilateral    rotator cuff tears   VALVE REPLACEMENT     Mitral valve; 27 mm [employer] Jude bileaflet valve    Current Medications: No outpatient medications have been marked as taking for the 10/04/22 encounter (Appointment) with FurtKathlen Modydence H, PA-C.     Allergies:   Patient has no known allergies.   Social History   Socioeconomic History   Marital status: Married    Spouse name: Tosin   Number of children: 2   Years of education: 14   Highest education level: Some college, no degree  Occupational History   Occupation: disable  Tobacco Use   Smoking status: Former    Packs/day: 1.00    Years: 4.00    Total pack years: 4.00    Types: Cigarettes    Quit date: 1998    Years since quitting: 26.1    Passive exposure: Past   Smokeless tobacco: Never  Vaping Use  Vaping Use: Never used  Substance and Sexual Activity   Alcohol use: No   Drug use: No   Sexual activity: Yes    Partners: Male    Birth control/protection: Surgical  Other Topics Concern   Not on file  Social History Narrative   Not on file   Social Determinants of Health   Financial Resource Strain: Low Risk  (10/29/2021)   Overall Financial Resource Strain (CARDIA)    Difficulty of Paying Living Expenses: Not hard at all  Food Insecurity: No Food Insecurity (09/17/2022)   Hunger Vital Sign    Worried About Running Out of Food in the Last Year: Never true    Ran Out of Food in the Last Year: Never true  Transportation Needs: No Transportation Needs (09/11/2022)   PRAPARE - Hydrologist (Medical): No    Lack of Transportation  (Non-Medical): No  Physical Activity: Insufficiently Active (10/29/2021)   Exercise Vital Sign    Days of Exercise per Week: 2 days    Minutes of Exercise per Session: 20 min  Stress: No Stress Concern Present (10/29/2021)   [place]    Feeling of Stress : Not at all  Social Connections: Moderately Integrated (10/29/2021)   Social Connection and Isolation Panel [NHANES]    Frequency of Communication with Friends and Family: Three times a week    Frequency of Social Gatherings with Friends and Family: Once a week    Attends Religious Services: More than 4 times per year    Active Member of [employer] or Organizations: No    Attends Music therapist: Never    Marital Status: Married     Family History: The patient's ***family history includes Breast cancer in her maternal aunt and maternal aunt; Colon cancer in her maternal uncle; Diabetes in her maternal grandmother and mother; Emphysema in her father; Heart attack in her mother; Hypertension in her mother; Kidney disease in her maternal grandmother.  ROS:   Please see the history of present illness.    *** All other systems reviewed and are negative.  EKGs/Labs/Other Studies Reviewed:    The following studies were reviewed today: ***  EKG:  EKG is *** ordered today.  The ekg ordered today demonstrates ***  Recent Labs: 09/11/2022: B Natriuretic Peptide 67.2 09/15/2022: Magnesium 2.3 10/04/2022: ALT 20; BUN 24; Creatinine, Ser 1.21; Hemoglobin 12.6; Platelets 200; Potassium 4.0; Sodium 134; TSH 3.915  Recent Lipid Panel    Component Value Date/Time   CHOL 112 08/20/2022 1350   CHOL 126 12/14/2019 0909   TRIG 100 08/20/2022 1350   HDL 23 (L) 08/20/2022 1350   HDL 48 12/14/2019 0909   CHOLHDL 4.9 08/20/2022 1350   VLDL 20 08/20/2022 1350   LDLCALC 69 08/20/2022 1350   LDLCALC 61 12/14/2019 0909   LDLCALC 66 07/08/2017 1127     Risk  Assessment/Calculations:   {Does this patient have ATRIAL FIBRILLATION?:[phone]}   Physical Exam:    VS:  LMP  (LMP Unknown)     Wt Readings from Last 3 Encounters:  09/20/22 198 lb (89.8 kg)  09/11/22 203 lb 14.8 oz (92.5 kg)  09/10/22 199 lb (90.3 kg)     GEN: *** Well nourished, well developed in no acute distress HEENT: Normal NECK: No JVD; No carotid bruits LYMPHATICS: No lymphadenopathy CARDIAC: ***RRR, no murmurs, rubs, gallops RESPIRATORY:  Clear to auscultation without rales, wheezing or rhonchi  ABDOMEN: Soft, non-tender,  non-distended MUSCULOSKELETAL:  No edema; No deformity  SKIN: Warm and dry NEUROLOGIC:  Alert and oriented x 3 PSYCHIATRIC:  Normal affect   ASSESSMENT:    No diagnosis found. PLAN:    In order of problems listed above:  ***  Disposition: Follow up {follow up:15908} with ***   Shared Decision Making/Informed Consent   {Are you ordering a CV Procedure (e.g. stress test, cath, DCCV, TEE, etc)?   Press F2        :[id number]    Signed, Socorro Kanitz Ninfa Meeker, PA-C  10/04/2022 7:06 AM    [facility]

## 2022-10-04 NOTE — Progress Notes (Signed)
ANTICOAGULATION CONSULT NOTE - Initial Consult  Pharmacy Consult for Warfarin Indication:  Mechanical Mitral Valve  No Known Allergies  Patient Measurements:   Heparin Dosing Weight:    Vital Signs: Temp: 97.9 F (36.6 C) (02/05 0609) Temp Source: Axillary (02/05 0609) BP: 132/78 (02/05 0630) Pulse Rate: 116 (02/05 0630)  Labs: Recent Labs    10/04/22 0224  HGB 12.6  HCT 39.9  PLT 200  LABPROT 28.1*  INR 2.7*  CREATININE 1.21*  CKTOTAL 68  TROPONINIHS 118*    Estimated Creatinine Clearance: 50.2 mL/min (A) (by C-G formula based on SCr of 1.21 mg/dL (H)).   Medical History: Past Medical History:  Diagnosis Date   AICD (automatic cardioverter/defibrillator) present    Anterior myocardial infarction [facility])    a. 10/2014 - occluded LAD, complicated by cardiogenic shock-->Med Rx as interventional team was unable to open LAD.   Arthritis    arthirits of knee on right   Cancer of upper lobe of right lung ([place]) 09/2017   radiation therapy right side   Cervical cancer (HCC)    in remission for ~20 yrs, s/p hysterectomy   Chronic combined systolic and diastolic CHF (congestive heart failure) ([place])    a. 10/2015 Echo: EF 20-25%, sev diast dysfxn, mildly reduced RV fxn. Nl MV prosthesis; b. 12/2017 Echo: EF 20-25%, diff HK. Sev ant/antsept HK, apical AK. Mild MS (mean graad 32mmHg). Mod TR. PASP 52mmHg.   Coronary artery disease    a. 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   Essential hypertension    Ischemic cardiomyopathy    a. 03/2015 s/p MDT single lead AICD;  b. 10/2015 Echo: EF 20-25%;  c. 12/2017 Echo: EF 20-25%, diff HK.   Mass of upper lobe of right lung    a. noted on CXR & CT 08/2017 w/ abnl PET CT.   Mitral valve disease    a. 2006 s/p MVR w/ 25mm SJM bileaflet mechanical valve-->chronic coumadin/ASA; b. 12/2017 Echo: Mild MS. Mean gradient 45mmHg.   Personal history of radiation  therapy    f/u lung ca    Medications:  (Not in a hospital admission)  Scheduled:   dexamethasone (DECADRON) injection  4 mg Intravenous Q6H   Infusions:   ceFEPime (MAXIPIME) IV     lactated ringers 150 mL/hr at 10/04/22 0816   levETIRAcetam Stopped (10/04/22 0557)   [START ON 10/05/2022] vancomycin      Assessment: 62 y.o. female admitted w/ AMS, leg pain, ?PNA. PMH significant for CAD, ischemic cardiomyopathy, s/p AICD placement, rheumatic heart disease s/p mMVR and A-fib (on Coumadin), stage IV adenocarcinoma of the lung (off Keytruda since 08/20/22) prediabetes, HLD, HTN. 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 Tasia Catchings who recommends continue coumadin for now until seen by Dr. Janese Banks this AM 2/5.  Patient was on [facility] PTA per chart review.   Warfarin dose PTA:  warfarin 2 mg daily  2/5  INR 2.7    Goal of Therapy:  INR 2.5-3.5 Monitor platelets by anticoagulation protocol: Yes   Plan:  INR therapeutic. Will continue with home dose of Warfarin 2 mg x1 DDI: abx (Vancomycin/Cefepime) On steroid F/u INR daily, CBC per protocol  Chinita Greenland PharmD Clinical Pharmacist 10/04/2022

## 2022-10-04 NOTE — Consult Note (Signed)
Hematology/Oncology Consult note [facility] Telephone:([phone] [phone]  Patient Care Team: Virginia Crews, MD as PCP - General (Family Medicine) End, Harrell Gave, MD as PCP - Cardiology (Cardiology) Sindy Guadeloupe, MD as Consulting Physician (Oncology) Sindy Guadeloupe, MD as Consulting Physician (Oncology) Noreene Filbert, MD as Consulting Physician (Radiation Oncology) Lovell Sheehan, MD as Consulting Physician (Orthopedic Surgery)   Name of the patient: Vanessa Carlson  [id number]  [birth year]    Reason for self: Metastatic lung cancer   Requesting physician: Dr. Hollice Gong  Date of visit: 10/04/2022    History of presenting illness-patient is a 62 year old female diagnosed with stage IV lung adenocarcinoma with malignant right pleural effusion about 3 to 4 months ago.  She received 3 cycles of single agent Keytruda but unfortunately has had disease progression with progressive increase in the size of right lung mass as well as mediastinal adenopathy and recurrent pleural effusions.  Plan was to add chemotherapy with carboplatin and Alimta to [facility] but we have not been able to start this as an outpatient due to poor tolerance to prior treatments and declining performance status.  At baseline patient also had an MRI brain in November 2023And patient was evaluated by Dr. Mickeal Skinner subsequently.  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  It was unclear if these foci truly represented metastatic disease and other considerations such as cerebral amyloid angiopathy and remote history of hemorrhagic emboli were not ruled out.  Repeat MRI was to be considered this month.  Patient met with me 2 weeks ago and was contemplating going home with hospice but was brought to the hospital with worsening mental status.  She underwent CT head without contrast which showed multiple lesions in the  brain which were more concerning for hyperdense metastases without significant edema.  Hemorrhagic mets was considered as a possibility.  ECOG PS- 4  Pain scale- 5   Review of systems- Review of Systems  Unable to perform ROS: Acuity of condition    No Known Allergies  Patient Active Problem List   Diagnosis Date Noted   Acute metabolic encephalopathy 93/71/6967   Metastasis to brain,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[place]) 10/04/2022   Stage 3a chronic kidney disease ([place]) 10/04/2022   Acute respiratory failure with hypoxia (HCC) 10/04/2022   Palliative care encounter 10/04/2022   Hemoptysis 09/16/2022   Supratherapeutic INR 09/10/2022   Pleural effusion on right 09/10/2022   Hypotension 09/10/2022   AKI (acute kidney injury) ([place]) 09/10/2022   Elevated bilirubin 09/10/2022   Prediabetes 08/09/2022   Abnormal brain MRI 07/12/2022   Lung cancer, primary, with metastasis from lung to other site, right ([place]) 06/08/2022   Goals of care, counseling/discussion 06/08/2022   NSVT (nonsustained ventricular tachycardia) ([place]) 05/20/2022   Palpitations 05/20/2022   Tinnitus of right ear 04/23/2022   Skin lesion 04/23/2022   History of total knee arthroplasty 12/11/2021   Cardiomyopathy ([place]) 10/29/2021   Mitral valve disorder 10/29/2021   Osteoarthritis of knee 09/15/2021   Enlarged thyroid 08/04/2021   Overactive bladder 08/04/2021   Severe sepsis ([place]) 05/28/2021   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[place]) 12/14/2019   Lung cancer ([place]) 05/31/2018   Hyperlipidemia LDL goal <70 03/22/2018   Morbid obesity ([place]) 12/22/2017   Insomnia 12/07/2017   OA (osteoarthritis) of knee 07/08/2017   Chronic anticoagulation 07/08/2017   Paroxysmal atrial fibrillation ([place])    Ischemic cardiomyopathy 02/24/2017   H/O mitral valve replacement with mechanical  valve 02/24/2017   Coronary artery disease involving native heart    Chronic HFrEF (heart failure with reduced ejection fraction) ([place]) 01/02/2017   HTN  (hypertension) 01/02/2017     Past Medical History:  Diagnosis Date   AICD (automatic cardioverter/defibrillator) present    Anterior myocardial infarction ([place])    a. 10/2014 - occluded LAD, complicated by cardiogenic shock-->Med Rx as interventional team was unable to open LAD.   Arthritis    arthirits of knee on right   Cancer of upper lobe of right lung ([place]) 09/2017   radiation therapy right side   Cervical cancer (HCC)    in remission for ~20 yrs, s/p hysterectomy   Chronic combined systolic and diastolic CHF (congestive heart failure) ([place])    a. 10/2015 Echo: EF 20-25%, sev diast dysfxn, mildly reduced RV fxn. Nl MV prosthesis; b. 12/2017 Echo: EF 20-25%, diff HK. Sev ant/antsept HK, apical AK. Mild MS (mean graad 77mmHg). Mod TR. PASP 55mmHg.   Coronary artery disease    a. 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   Essential hypertension    Ischemic cardiomyopathy    a. 03/2015 s/p MDT single lead AICD;  b. 10/2015 Echo: EF 20-25%;  c. 12/2017 Echo: EF 20-25%, diff HK.   Mass of upper lobe of right lung    a. noted on CXR & CT 08/2017 w/ abnl PET CT.   Mitral valve disease    a. 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 12/2017 Echo: Mild MS. Mean gradient 28mmHg.   Personal history of radiation therapy    f/u lung ca     Past Surgical History:  Procedure Laterality Date   APPENDECTOMY  1998   BREAST EXCISIONAL BIOPSY Left 80s   benign   CARDIAC CATHETERIZATION     CARDIAC DEFIBRILLATOR PLACEMENT     COLONOSCOPY WITH PROPOFOL N/A 01/11/2020   Procedure: COLONOSCOPY WITH PROPOFOL;  Surgeon: Jonathon Bellows, MD;  Location: [facility] ENDOSCOPY;  Service: Gastroenterology;  Laterality: N/A;   IR IMAGING GUIDED PORT INSERTION  06/29/2022   PORTA CATH INSERTION     RADICAL HYSTERECTOMY  1998   REPLACEMENT TOTAL KNEE Right 12/10/2021   SHOULDER SURGERY Bilateral     rotator cuff tears   VALVE REPLACEMENT     Mitral valve; 27 mm [employer]. Jude bileaflet valve    Social History   Socioeconomic History   Marital status: Married    Spouse name: Tosin   Number of children: 2   Years of education: 14   Highest education level: Some college, no degree  Occupational History   Occupation: disable  Tobacco Use   Smoking status: Former    Packs/day: 1.00    Years: 4.00    Total pack years: 4.00    Types: Cigarettes    Quit date: 1998    Years since quitting: 26.1    Passive exposure: Past   Smokeless tobacco: Never  Vaping Use   Vaping Use: Never used  Substance and Sexual Activity   Alcohol use: No   Drug use: No   Sexual activity: Yes    Partners: Male    Birth control/protection: Surgical  Other Topics Concern   Not on file  Social History Narrative   Not on file   Social Determinants of Health   Financial Resource Strain: Low Risk  (10/29/2021)   Overall Financial Resource Strain (CARDIA)  Difficulty of Paying Living Expenses: Not hard at all  Food Insecurity: No Food Insecurity (10/04/2022)   Hunger Vital Sign    Worried About Running Out of Food in the Last Year: Never true    Ran Out of Food in the Last Year: Never true  Transportation Needs: No Transportation Needs (10/04/2022)   PRAPARE - Hydrologist (Medical): No    Lack of Transportation (Non-Medical): No  Physical Activity: Insufficiently Active (10/29/2021)   Exercise Vital Sign    Days of Exercise per Week: 2 days    Minutes of Exercise per Session: 20 min  Stress: No Stress Concern Present (10/29/2021)   [place]    Feeling of Stress : Not at all  Social Connections: Moderately Integrated (10/29/2021)   Social Connection and Isolation Panel [NHANES]    Frequency of Communication with Friends and Family: Three times a week    Frequency of Social Gatherings with Friends and Family:  Once a week    Attends Religious Services: More than 4 times per year    Active Member of [employer] or Organizations: No    Attends Archivist Meetings: Never    Marital Status: Married  Human resources officer Violence: Not At Risk (10/04/2022)   Humiliation, Afraid, Rape, and Kick questionnaire    Fear of Current or Ex-Partner: No    Emotionally Abused: No    Physically Abused: No    Sexually Abused: No     Family History  Problem Relation Age of Onset   Heart attack Mother    Hypertension Mother    Diabetes Mother    Emphysema Father        smoker   Diabetes Maternal Grandmother    Kidney disease Maternal Grandmother    Breast cancer Maternal Aunt    Breast cancer Maternal Aunt    Colon cancer Maternal Uncle      Current Facility-Administered Medications:    acetaminophen (TYLENOL) tablet 650 mg, 650 mg, Oral, Q6H PRN **OR** acetaminophen (TYLENOL) suppository 650 mg, 650 mg, Rectal, Q6H PRN, Athena Masse, MD   albuterol (PROVENTIL) (2.5 MG/3ML) 0.083% nebulizer solution 2.5 mg, 2.5 mg, Nebulization, Q2H PRN, Athena Masse, MD   ceFEPIme (MAXIPIME) 2 g in sodium chloride 0.9 % 100 mL IVPB, 2 g, Intravenous, Q12H, Athena Masse, MD, Stopped at 10/04/22 1936   dexamethasone (DECADRON) injection 4 mg, 4 mg, Intravenous, Q6H, Athena Masse, MD, 4 mg at 10/04/22 1724   HYDROmorphone (DILAUDID) injection 1 mg, 1 mg, Intravenous, Q3H PRN, Hollice Gong, Mir M, MD, 1 mg at 10/04/22 1425   levETIRAcetam (KEPPRA) IVPB 500 mg/100 mL premix, 500 mg, Intravenous, Q12H, Athena Masse, MD, Last Rate: 400 mL/hr at 10/04/22 1937, 500 mg at 10/04/22 1937   LORazepam (ATIVAN) 2 MG/ML concentrated solution 1 mg, 1 mg, Oral, Q4H PRN, Hollice Gong, Mir M, MD   ondansetron (ZOFRAN) tablet 4 mg, 4 mg, Oral, Q6H PRN **OR** ondansetron (ZOFRAN) injection 4 mg, 4 mg, Intravenous, Q6H PRN, Athena Masse, MD   oxyCODONE (Oxy IR/ROXICODONE) immediate release tablet 10 mg, 10 mg, Oral, Q4H PRN,  Hollice Gong, Mir M, MD   Derrill Memo ON 10/05/2022] vancomycin (VANCOREADY) IVPB 750 mg/150 mL, 750 mg, Intravenous, Q24H, Dolan, Carissa E, RPH   warfarin (COUMADIN) tablet 2 mg, 2 mg, Oral, ONCE-1600, Merrill, Kristin A, [place]   Warfarin - Pharmacist Dosing Inpatient, , Does not apply, q1600, Noralee Space, [place]  No current outpatient medications on file.   Physical exam:  Vitals:   10/04/22 1600 10/04/22 1630 10/04/22 1700 10/04/22 2030  BP: 125/77 126/79 113/87 116/75  Pulse: (!) 112 (!) 110 (!) 112   Resp: 17 18 20 14   Temp:      TempSrc:      SpO2: 96% 94% 95% 99%  Weight:       Physical Exam Constitutional:      Comments: Oriented to self and person  Cardiovascular:     Rate and Rhythm: Regular rhythm. Tachycardia present.     Heart sounds: Normal heart sounds.  Pulmonary:     Comments: Effort of thing increased.  Breath sounds decreased bilaterally diffusely Abdominal:     General: Bowel sounds are normal.     Palpations: Abdomen is soft.  Skin:    General: Skin is warm and dry.  Neurological:     Mental Status: She is alert and oriented to person, place, and time.           Latest Ref Rng & Units 10/04/2022    2:24 AM  CMP  Glucose 70 - 99 mg/dL 131   BUN 8 - 23 mg/dL 24   Creatinine 0.44 - 1.00 mg/dL 1.21   Sodium 135 - 145 mmol/L 134   Potassium 3.5 - 5.1 mmol/L 4.0   Chloride 98 - 111 mmol/L 97   CO2 22 - 32 mmol/L 23   Calcium 8.9 - 10.3 mg/dL 9.2   Total Protein 6.5 - 8.1 g/dL 8.0   Total Bilirubin 0.3 - 1.2 mg/dL 2.3   Alkaline Phos 38 - 126 U/L 444   AST 15 - 41 U/L 51   ALT 0 - 44 U/L 20       Latest Ref Rng & Units 10/04/2022    2:24 AM  CBC  WBC 4.0 - 10.5 K/uL 23.2   Hemoglobin 12.0 - 15.0 g/dL 12.6   Hematocrit 36.0 - 46.0 % 39.9   Platelets 150 - 400 K/uL 200     @IMAGES @  US THORACENTESIS ASP PLEURAL SPACE W/IMG GUIDE  Result Date: 10/04/2022 INDICATION: Patient with history of non-small cell lung cancer with recurrent right pleural  effusion. Request received for diagnostic and therapeutic right thoracentesis. EXAM: ULTRASOUND GUIDED DIAGNOSTIC AND THERAPEUTIC RIGHT THORACENTESIS MEDICATIONS: 10 mL 1 % lidocaine COMPLICATIONS: None immediate. PROCEDURE: An ultrasound guided thoracentesis was thoroughly discussed with the patient and questions answered. The benefits, risks, alternatives and complications were also discussed. The patient understands and wishes to proceed with the procedure. Written consent was obtained. Ultrasound was performed to localize and mark an adequate pocket of fluid in the right chest. The area was then prepped and draped in the normal sterile fashion. 1% Lidocaine was used for local anesthesia. Under ultrasound guidance a 6 Fr Safe-T-Centesis catheter was introduced. Thoracentesis was performed. The catheter was removed and a dressing applied. FINDINGS: A total of approximately 900 cc of dark brown fluid was removed. Samples were sent to the laboratory as requested by the clinical team. IMPRESSION: Successful ultrasound guided right thoracentesis yielding 900 cc of pleural fluid. Read by: Narda Rutherford, AGNP-BC Electronically Signed   By: Corrie Mckusick D.O.   On: 10/04/2022 11:30   DG Chest Port 1 View  Result Date: 10/04/2022 CLINICAL DATA:  Recurrent right pleural effusion status post thoracentesis. EXAM: PORTABLE CHEST 1 VIEW COMPARISON:  10/04/2022 at 0238 hours and CT chest 10/04/2022. FINDINGS: Trachea is midline. Heart size grossly stable. Left ICD  lead tip projects over the right ventricle. Right IJ Port-A-Cath tip is in the low SVC. Interval decrease in size of a loculated right pleural effusion, still moderate to large in size. No pneumothorax. Right upper lobe mass with perihilar collapse/consolidation better evaluated on CT chest performed earlier today. Left lung nodules better seen on today's CT. Minimal streaky atelectasis in the left lung base. Tiny left pleural effusion. IMPRESSION: 1. Moderate to  large loculated right pleural effusion, slightly decreased in size from exam performed earlier today. No pneumothorax. 2. Right upper lobe mass, perihilar airspace consolidation and left lung nodules, better evaluated on today's CT chest. 3. Tiny left pleural effusion. Electronically Signed   By: Lorin Picket M.D.   On: 10/04/2022 09:48   CT Angio Chest PE W and/or Wo Contrast  Result Date: 10/04/2022 CLINICAL DATA:  Pulmonary embolism suspected EXAM: CT ANGIOGRAPHY CHEST WITH CONTRAST TECHNIQUE: Multidetector CT imaging of the chest was performed using the standard protocol during bolus administration of intravenous contrast. Multiplanar CT image reconstructions and MIPs were obtained to evaluate the vascular anatomy. RADIATION DOSE REDUCTION: This exam was performed according to the departmental dose-optimization program which includes automated exposure control, adjustment of the mA and/or kV according to patient size and/or use of iterative reconstruction technique. CONTRAST:  81mL OMNIPAQUE IOHEXOL 350 MG/ML SOLN COMPARISON:  Chest CT 09/10/2022 FINDINGS: Cardiovascular: Satisfactory opacification of the pulmonary arteries to the segmental level. No evidence of pulmonary embolism. Cardiomegaly with thinning of the anterior wall and apex of the left ventricle where there is faint calcification. Mitral valve replacement. Coronary atherosclerosis Mediastinum/Nodes: Right paratracheal adenopathy with rounded 12 mm node similar to prior. No hematoma. Lungs/Pleura: Right apical mass with borders poorly defined by arterial timing, ~ 7 cm in size with fracturing/erosion of lateral right second and third ribs. Large right pleural effusion which appears complicated with layering debris. The right lower lobe is collapsed. Patchy nodules in the left lung especially the upper lobe, also seen on prior. Upper Abdomen: No acute finding. Ill-defined medial segment right liver lesion measuring approximately 1 cm.  Musculoskeletal: Eroded anterior to lateral right second and third ribs which are closely approximating the right upper lobe mass. Diffuse heterogeneity of the spine attributed to widespread metastases. Review of the MIP images confirms the above findings. IMPRESSION: 1. Negative for pulmonary embolism. 2. Known right upper lobe malignancy with osseous, pulmonary, and possibly hepatic metastatic spread. Continued large right pleural effusion with lower lobe collapse. Electronically Signed   By: Jorje Guild M.D.   On: 10/04/2022 04:21   CT Head Wo Contrast  Result Date: 10/04/2022 CLINICAL DATA:  Altered mental status EXAM: CT HEAD WITHOUT CONTRAST TECHNIQUE: Contiguous axial images were obtained from the base of the skull through the vertex without intravenous contrast. RADIATION DOSE REDUCTION: This exam was performed according to the departmental dose-optimization program which includes automated exposure control, adjustment of the mA and/or kV according to patient size and/or use of iterative reconstruction technique. COMPARISON:  None Available. FINDINGS: Brain: There are numerous (at least 25) hyperdense lesions scattered throughout the brain. The largest abuts the right lateral ventricle and measures 10 mm. There is no edema. Vascular: No abnormal hyperdensity of the major intracranial arteries or dural venous sinuses. No intracranial atherosclerosis. Skull: The visualized skull base, calvarium and extracranial soft tissues are normal. Sinuses/Orbits: No fluid levels or advanced mucosal thickening of the visualized paranasal sinuses. No mastoid or middle ear effusion. The orbits are normal. IMPRESSION: Numerous hyperdense metastases scattered throughout  the brain without significant edema. These are likely hemorrhagic, but acuity is uncertain. Critical Value/emergent results were called by telephone at the time of interpretation on 10/04/2022 at 4:13 am to provider MARK QUALE , who verbally acknowledged  these results. Electronically Signed   By: Ulyses Jarred M.D.   On: 10/04/2022 04:13   DG Chest Portable 1 View  Result Date: 10/04/2022 CLINICAL DATA:  Hypoxia EXAM: PORTABLE CHEST 1 VIEW COMPARISON:  09/13/2022 FINDINGS: Unchanged position of left chest wall AICD. There is a right chest wall power-injectable Port-A-Cath with tip at the cavoatrial junction via a right internal jugular vein approach. Large right pleural effusion with near complete opacification of the right hemithorax, worsened. Left lung is clear. Cardiomediastinal contours are normal. Remote median sternotomy and valvuloplasty. IMPRESSION: Large right pleural effusion with near complete opacification of the right hemithorax, worsened from prior. Electronically Signed   By: Ulyses Jarred M.D.   On: 10/04/2022 02:54   US THORACENTESIS ASP PLEURAL SPACE W/IMG GUIDE  Result Date: 09/13/2022 INDICATION: Patient with history of non-small cell lung cancer with recurrent right pleural effusion. Request received for therapeutic thoracentesis. EXAM: ULTRASOUND GUIDED THERAPEUTIC RIGHT THORACENTESIS MEDICATIONS: 10 mL 1 % lidocaine COMPLICATIONS: None immediate. PROCEDURE: An ultrasound guided thoracentesis was thoroughly discussed with the patient and questions answered. The benefits, risks, alternatives and complications were also discussed. The patient understands and wishes to proceed with the procedure. Written consent was obtained. Ultrasound was performed to localize and mark an adequate pocket of fluid in the right chest. The area was then prepped and draped in the normal sterile fashion. 1% Lidocaine was used for local anesthesia. Under ultrasound guidance a 6 Fr Safe-T-Centesis catheter was introduced. Thoracentesis was performed. The catheter was removed and a dressing applied. FINDINGS: A total of approximately 1.3 L of dark, bloody fluid was removed. IMPRESSION: Successful ultrasound guided right thoracentesis yielding 1.3 L of pleural  fluid. Read by: Narda Rutherford, AGNP-BC Electronically Signed   By: Miachel Roux M.D.   On: 09/13/2022 14:39   DG Chest Port 1 View  Result Date: 09/13/2022 CLINICAL DATA:  Post thora EXAM: PORTABLE CHEST - 1 VIEW COMPARISON:  CT 09/10/2022 FINDINGS: No pneumothorax. Persistent moderate right pleural effusion, with atelectasis/consolidation at the right lung base. Left lung clear. Stable right IJ port catheter to the distal SVC. Stable left subclavian AICD. Heart size and mediastinal contours are within normal limits. Post MVR. Sternotomy wires. IMPRESSION: Persistent moderate right pleural effusion. No pneumothorax post thoracentesis. Electronically Signed   By: Lucrezia Europe M.D.   On: 09/13/2022 14:11   ECHOCARDIOGRAM COMPLETE  Result Date: 09/11/2022    ECHOCARDIOGRAM REPORT   Patient Name:   CALINDA STOCKINGER Eshelman Date of Exam: 09/11/2022 Medical Rec #:  [id number]         Height:       62.0 in Accession #:    [id number]        Weight:       203.9 lb Date of Birth:  1961/04/16          BSA:          1.928 m Patient Age:    40 years          BP:           93/62 mmHg Patient Gender: F                 HR:           83 bpm. Exam  Location:  [facility] Procedure: 2D Echo and Intracardiac Opacification Agent Indications:     CHF I50.21  History:         Patient has prior history of Echocardiogram examinations, most                  recent 01/28/2022.                   Mitral Valve: 27 mm mechanical valve valve is present in the                  mitral position.  Sonographer:     Kathlen Brunswick RDCS Referring Phys:  [id number] [place] Diagnosing Phys: Eleonore Chiquito MD  Sonographer Comments: Technically difficult study due to poor echo windows and suboptimal subcostal window. Image acquisition challenging due to patient body habitus. IMPRESSIONS  1. Left ventricular ejection fraction, by estimation, is 20 to 25%. The left ventricle has severely decreased function. The left ventricle demonstrates regional wall motion  abnormalities (see scoring diagram/findings for description). Left ventricular diastolic function could not be evaluated.  2. Right ventricular systolic function is normal. The right ventricular size is normal. There is normal pulmonary artery systolic pressure. The estimated right ventricular systolic pressure is 93.8 mmHg.  3. The mitral valve has been repaired/replaced. No evidence of mitral valve regurgitation. The mean mitral valve gradient is 2.0 mmHg with average heart rate of 85 bpm. There is a 27 mm mechanical valve present in the mitral position. Echo findings are consistent with normal structure and function of the mitral valve prosthesis.  4. The aortic valve is tricuspid. Aortic valve regurgitation is not visualized. No aortic stenosis is present.  5. The inferior vena cava is normal in size with greater than 50% respiratory variability, suggesting right atrial pressure of 3 mmHg. Comparison(s): No significant change from prior study. Conclusion(s)/Recommendation(s): Findings consistent with ischemic cardiomyopathy. No left ventricular mural or apical thrombus/thrombi. FINDINGS  Left Ventricle: Left ventricular ejection fraction, by estimation, is 20 to 25%. The left ventricle has severely decreased function. The left ventricle demonstrates regional wall motion abnormalities. Definity contrast agent was given IV to delineate the left ventricular endocardial borders. The left ventricular internal cavity size was normal in size. There is no left ventricular hypertrophy. Left ventricular diastolic function could not be evaluated due to mitral valve replacement. Left ventricular  diastolic function could not be evaluated.  LV Wall Scoring: The mid and distal anterior septum, apical lateral segment, apical anterior segment, and apex are akinetic. The anterior wall, antero-lateral wall, entire inferior wall, posterior wall, basal anteroseptal segment, mid inferoseptal segment, and basal inferoseptal segment  are hypokinetic. Right Ventricle: The right ventricular size is normal. No increase in right ventricular wall thickness. Right ventricular systolic function is normal. There is normal pulmonary artery systolic pressure. The tricuspid regurgitant velocity is 2.68 m/s, and  with an assumed right atrial pressure of 3 mmHg, the estimated right ventricular systolic pressure is 10.1 mmHg. Left Atrium: Left atrial size was normal in size. Right Atrium: Right atrial size was normal in size. Pericardium: There is no evidence of pericardial effusion. Mitral Valve: The mitral valve has been repaired/replaced. No evidence of mitral valve regurgitation. There is a 27 mm mechanical valve present in the mitral position. Echo findings are consistent with normal structure and function of the mitral valve prosthesis. MV peak gradient, 4.8 mmHg. The mean mitral valve gradient is 2.0 mmHg with average heart rate of 85 bpm. Tricuspid Valve:  The tricuspid valve is grossly normal. Tricuspid valve regurgitation is mild . No evidence of tricuspid stenosis. Aortic Valve: The aortic valve is tricuspid. Aortic valve regurgitation is not visualized. No aortic stenosis is present. Aortic valve peak gradient measures 6.2 mmHg. Pulmonic Valve: The pulmonic valve was grossly normal. Pulmonic valve regurgitation is mild. No evidence of pulmonic stenosis. Aorta: The aortic root and ascending aorta are structurally normal, with no evidence of dilitation. Venous: The inferior vena cava is normal in size with greater than 50% respiratory variability, suggesting right atrial pressure of 3 mmHg. IAS/Shunts: The atrial septum is grossly normal. Additional Comments: A device lead is visualized in the right atrium and right ventricle.  LEFT VENTRICLE PLAX 2D LVIDd:         5.20 cm     Diastology LVIDs:         4.70 cm     LV e' medial:    6.53 cm/s LV PW:         0.80 cm     LV E/e' medial:  11.6 LV IVS:        0.80 cm     LV e' lateral:   8.16 cm/s LVOT  diam:     1.65 cm     LV E/e' lateral: 9.3 LV SV:         28 LV SV Index:   14 LVOT Area:     2.14 cm  LV Volumes (MOD) LV vol d, MOD A2C: 98.8 ml LV vol d, MOD A4C: 94.4 ml LV vol s, MOD A2C: 49.1 ml LV vol s, MOD A4C: 63.6 ml LV SV MOD A2C:     49.8 ml LV SV MOD A4C:     94.4 ml LV SV MOD BP:      42.9 ml RIGHT VENTRICLE RV Basal diam:  2.20 cm LEFT ATRIUM             Index LA diam:        3.50 cm 1.82 cm/m LA Vol (A2C):   82.2 ml 42.64 ml/m LA Vol (A4C):   30.2 ml 15.67 ml/m LA Biplane Vol: 51.7 ml 26.82 ml/m  AORTIC VALVE                 PULMONIC VALVE AV Area (Vmax): 1.28 cm     PV Vmax:          0.90 m/s AV Vmax:        124.00 cm/s  PV Peak grad:     3.2 mmHg AV Peak Grad:   6.2 mmHg     PR End Diast Vel: 5.20 msec LVOT Vmax:      74.20 cm/s LVOT Vmean:     46.800 cm/s LVOT VTI:       0.130 m  AORTA Ao Root diam: 2.70 cm Ao Asc diam:  2.40 cm MITRAL VALVE                TRICUSPID VALVE MV Area (PHT): 3.27 cm     TR Peak grad:   28.7 mmHg MV Area VTI:   1.27 cm     TR Vmax:        268.00 cm/s MV Peak grad:  4.8 mmHg MV Mean grad:  2.0 mmHg     SHUNTS MV Vmax:       1.10 m/s     Systemic VTI:  0.13 m MV Vmean:      55.7 cm/s    Systemic Diam: 1.65  cm MV Decel Time: 232 msec MV E velocity: 75.50 cm/s MV A velocity: 112.00 cm/s MV E/A ratio:  0.67 Eleonore Chiquito MD Electronically signed by Eleonore Chiquito MD Signature Date/Time: 09/11/2022/5:40:25 PM    Final    CT CHEST ABDOMEN PELVIS WO CONTRAST  Result Date: 09/10/2022 CLINICAL DATA:  Non-small cell lung cancer, elevated INR EXAM: CT CHEST, ABDOMEN AND PELVIS WITHOUT CONTRAST TECHNIQUE: Multidetector CT imaging of the chest, abdomen and pelvis was performed following the standard protocol without IV contrast. Examination was performed without intravenous contrast at the request of the ordering physician. Evaluation of the soft tissues, vascular structures, and solid viscera is limited. RADIATION DOSE REDUCTION: This exam was performed according to the  departmental dose-optimization program which includes automated exposure control, adjustment of the mA and/or kV according to patient size and/or use of iterative reconstruction technique. COMPARISON:  05/21/2022, 06/10/2022 FINDINGS: CT CHEST FINDINGS Cardiovascular: Stable cardiac pacer. Mitral valve prosthesis. No pericardial effusion. Normal caliber of the thoracic aorta. Stable atherosclerosis of the aorta and coronary vasculature. Mediastinum/Nodes: Thyroid, trachea, and esophagus are grossly unremarkable. 9 mm short axis pretracheal lymph node reference image 17/2, previously measuring approximately 12 mm. Evaluation of the mediastinum and hilar regions is limited due to the lack of intravenous contrast. Lungs/Pleura: Right upper lobe mass measures 6.9 x 5.0 cm reference image 15/2, previously measuring 6.4 x 4.6 cm, compatible with recurrent right upper lobe lung cancer. There is a large multiloculated right pleural effusion. Compressive atelectasis is seen within the right middle and right lower lobes, with minimal aeration superior segment right lower lobe. There is increase in the size and number of left lung pulmonary nodules seen on prior study, consistent with progressive metastatic disease. Index nodule left upper lobe image 34/4 measures 12 x 8 mm, previously 6 x 5 mm. No left-sided effusion. No pneumothorax. Musculoskeletal: Stable right anterolateral second and third rib fractures. No new bony abnormalities. Reconstructed images demonstrate no additional findings. CT ABDOMEN PELVIS FINDINGS Hepatobiliary: Gallbladder sludge layering dependently within the gallbladder. No calcified gallstones or cholecystitis. Indeterminate 8 mm hypodensity inferior right lobe liver image 59/2, metastatic disease cannot be excluded. No biliary duct dilation. Pancreas: Unremarkable unenhanced appearance. Spleen: Unremarkable unenhanced appearance. Adrenals/Urinary Tract: No urinary tract calculi or obstructive  uropathy. The adrenals and bladder are grossly unremarkable. Stomach/Bowel: No bowel obstruction or ileus. No bowel wall thickening or inflammatory change. Vascular/Lymphatic: Aortic atherosclerosis. No enlarged abdominal or pelvic lymph nodes. Reproductive: Prior hysterectomy.  No adnexal masses. Other: No free fluid or free intraperitoneal gas. No abdominal wall hernia. Musculoskeletal: No acute or destructive bony lesions. Reconstructed images demonstrate no additional findings. IMPRESSION: 1. Enlarging right upper lobe mass consistent with progressive lung cancer. 2. Large multiloculated right pleural effusion, increased in size since prior study. Significant compressive atelectasis within the right middle and right lower lobe, with only a small portion of normally aerated lung within the superior segment right lower lobe. 3. New and enlarging left lung pulmonary nodules consistent with progressive metastatic disease. 4. Indeterminate 8 mm hypodensity inferior right lobe liver, not well seen on previous exams. Metastatic disease cannot be excluded. 5. Grossly stable borderline enlarged mediastinal adenopathy. 6. Gallbladder sludge. No evidence of cholelithiasis or cholecystitis. 7.  Aortic Atherosclerosis (ICD10-I70.0). Electronically Signed   By: Randa Ngo M.D.   On: 09/10/2022 17:50    Assessment and plan- Patient is a 62 y.o. female with metastatic lung adenocarcinoma admitted for acute on chronic hypoxic respiratory failure and acute encephalopathy  Acute  encephalopathy: Unclear if secondary to underlying possible pneumonia versus findings of CT head which suggests hyperdense hemorrhagic metastases.  However she was noted to have some of these abnormal findings on her MRI even back in December 2023.  Metastases seems likely given the CT head appearance.  Ideally we need an MRI for direct comparison.  However overall patient's performance status is quite poor and even prior to getting the CT head  patient was tolerating single agent Keytruda poorly.  We have not been able to add chemotherapy to [facility] despite progression noted on her recent CT scans.  Patient was contemplating hospice even prior to her present hospitalization.  In the light of recent CT head findings I think it would be very reasonable to proceed with hospice.  I have discussed all this in great detail with patient's daughter Ellison Hughs and her son.  They are in agreement.  They would ideally patient to be admitted to hospice home and this is being looked into.  Appreciate palliative care input  I would recommend continuing Coumadin at this time given that patient has mechanical heart valve despite concerns for possible hemorrhagic metastases.  Patient does have an AICD in place and I would recommend deactivating it upon admission into hospice home.    Visit Diagnosis 1. Multiple lesions on computed tomography of brain and spine   2. Recurrent pleural effusion on right   3. SIRS (systemic inflammatory response syndrome) (HCC)   4. HCAP (healthcare-associated pneumonia)   5. Sepsis, due to unspecified organism, unspecified whether acute organ dysfunction present [facility])     Dr. Randa Evens, MD, MPH [facility] [phone] 10/04/2022

## 2022-10-04 NOTE — ED Notes (Signed)
Patient in bed, awake alert and oriented. Able to answer questions, patient in NAD, with respirations even and nonlabored. Visitor at bedside. No signs of distress. Patient denies needs.

## 2022-10-04 NOTE — Progress Notes (Unsigned)
Care Management & Coordination Services Pharmacy Note  10/04/2022 Name:  Vanessa Carlson MRN:  [id number] DOB:  01/13/1961  Summary: ***  Recommendations/Changes made from today's visit: ***  Follow up plan: ***   Subjective: Vanessa Carlson is an 62 y.o. year old female who is a primary patient of Bacigalupo, Dionne Bucy, MD.  The care coordination team was consulted for assistance with disease management and care coordination needs.    {CCMTELEPHONEFACETOFACE:21091510} for {CCMINITIALFOLLOWUPCHOICE:21091511}.  Recent office visits: ***  Recent consult visits: ***  Hospital visits: {Hospital DC Yes/No:25215}   Objective:  Lab Results  Component Value Date   CREATININE 1.21 (H) 10/04/2022   BUN 24 (H) 10/04/2022   GFRNONAA 51 (L) 10/04/2022   GFRAA >60 03/19/2020   NA 134 (L) 10/04/2022   K 4.0 10/04/2022   CALCIUM 9.2 10/04/2022   CO2 23 10/04/2022   GLUCOSE 131 (H) 10/04/2022    Lab Results  Component Value Date/Time   HGBA1C 6.2 (H) 08/20/2022 01:50 PM   HGBA1C 5.9 (H) 12/14/2019 09:09 AM    Last diabetic Eye exam: No results found for: "HMDIABEYEEXA"  Last diabetic Foot exam: No results found for: "HMDIABFOOTEX"   Lab Results  Component Value Date   CHOL 112 08/20/2022   HDL 23 (L) 08/20/2022   LDLCALC 69 08/20/2022   TRIG 100 08/20/2022   CHOLHDL 4.9 08/20/2022       Latest Ref Rng & Units 10/04/2022    2:24 AM 09/27/2022   10:02 AM 09/20/2022    9:03 AM  Hepatic Function  Total Protein 6.5 - 8.1 g/dL 8.0  8.4  8.2   Albumin 3.5 - 5.0 g/dL 3.4  3.9  3.8   AST 15 - 41 U/L 51  60  57   ALT 0 - 44 U/L 20  23  16    Alk Phosphatase 38 - 126 U/L 444  279  249   Total Bilirubin 0.3 - 1.2 mg/dL 2.3  1.8  1.6     Lab Results  Component Value Date/Time   TSH 3.915 10/04/2022 02:24 AM   TSH 1.937 09/27/2022 10:02 AM       Latest Ref Rng & Units 10/04/2022    2:24 AM 09/27/2022   10:02 AM 09/20/2022    9:03 AM  CBC  WBC 4.0 - 10.5 K/uL 23.2   15.1  12.0   Hemoglobin 12.0 - 15.0 g/dL 12.6  13.1  12.8   Hematocrit 36.0 - 46.0 % 39.9  40.7  38.4   Platelets 150 - 400 K/uL 200  226  151     No results found for: "VD25OH", "VITAMINB12"  Clinical ASCVD: {YES/NO:21197} The ASCVD Risk score (Arnett DK, et al., 2019) failed to calculate for the following reasons:   The patient has a prior MI or stroke diagnosis    ***Other: (CHADS2VASc if Afib, MMRC or CAT for COPD, ACT, DEXA)     08/09/2022    1:39 PM 04/23/2022    9:07 AM 02/04/2022    1:57 PM  Depression screen PHQ 2/9  Decreased Interest 1 0 0  Down, Depressed, Hopeless 0 0 0  PHQ - 2 Score 1 0 0  Altered sleeping 0 2 1  Tired, decreased energy 0 2 0  Change in appetite 2 0 0  Feeling bad or failure about yourself  0 0 0  Trouble concentrating 0 0 0  Moving slowly or fidgety/restless 0 0 0  Suicidal thoughts 0 0 0  PHQ-9  Score 3 4 1   Difficult doing work/chores Not difficult at all Not difficult at all Not difficult at all     Social History   Tobacco Use  Smoking Status Former   Packs/day: 1.00   Years: 4.00   Total pack years: 4.00   Types: Cigarettes   Quit date: 12   Years since quitting: 26.1   Passive exposure: Past  Smokeless Tobacco Never   BP Readings from Last 3 Encounters:  10/04/22 127/85  10/01/22 130/80  09/27/22 135/84   Pulse Readings from Last 3 Encounters:  10/04/22 (!) 108  10/01/22 (!) 117  09/27/22 (!) 114   Wt Readings from Last 3 Encounters:  09/20/22 198 lb (89.8 kg)  09/11/22 203 lb 14.8 oz (92.5 kg)  09/10/22 199 lb (90.3 kg)   BMI Readings from Last 3 Encounters:  09/20/22 36.21 kg/m  09/11/22 37.30 kg/m  09/10/22 36.40 kg/m    No Known Allergies  Medications Reviewed Today     Reviewed by Virginia Crews, MD (Physician) on 10/01/22 at 1137  Med List Status: <None>   Medication Order Taking? Sig Documenting Provider Last Dose Status Informant  aspirin 81 MG chewable tablet [id number] Yes Chew 81 mg by  mouth daily. [provider] Taking Active Self  atorvastatin (LIPITOR) 80 MG tablet [id number] Yes TAKE 1 TABLET BY MOUTH ONCE DAILY. OFFICE VISIT AND LABS NEEDED FOR FURTHER REFILLS. Virginia Crews, MD Taking Active Self, Pharmacy Records  docusate sodium (COLACE) 250 MG capsule [id number] Yes Take 250 mg by mouth daily. [provider] Taking Active   ezetimibe (ZETIA) 10 MG tablet [id number] Yes Take 1 tablet (10 mg total) by mouth daily. End, Harrell Gave, MD Taking Active Self, Pharmacy Records  gabapentin (NEURONTIN) 300 MG capsule [id number] Yes Take 1 capsule (300 mg total) by mouth 2 (two) times daily. Virginia Crews, MD Taking Active Self, Pharmacy Records  LORazepam (ATIVAN) 0.5 MG tablet [id number] Yes Take 1 tablet (0.5 mg total) by mouth every 6 (six) hours as needed (nausea). Sindy Guadeloupe, MD Taking Active   metolazone (ZAROXOLYN) 2.5 MG tablet [id number] Yes Take 1 tablet (2.5 mg) by mouth once a week. Take 30 minutes prior to your furosemide dose. End, Harrell Gave, MD Taking Active Self  metoprolol succinate (TOPROL-XL) 25 MG 24 hr tablet [id number] Yes TAKE 1 TABLET BY MOUTH TWICE DAILY IN THE MORNING AND AT BEDTIME End, Harrell Gave, MD Taking Active Self, Pharmacy Records  midodrine (PROAMATINE) 10 MG tablet [id number] Yes Take 1 tablet (10 mg total) by mouth 3 (three) times daily with meals. Adan Sis, MD Taking Active   OLANZapine (ZYPREXA) 5 MG tablet [id number] Yes Take 1 tablet (5 mg total) by mouth at bedtime. Borders, Kirt Boys, NP Taking Active   ondansetron (ZOFRAN) 4 MG tablet [id number] Yes Take 1 tablet (4 mg total) by mouth every 8 (eight) hours as needed for nausea or vomiting. Sindy Guadeloupe, MD Taking Active   oxybutynin (DITROPAN-XL) 10 MG 24 hr tablet [id number] Yes Take 1 tablet (10 mg total) by mouth daily. Bjorn Loser, MD Taking Active Self  oxyCODONE (OXY IR/ROXICODONE) 5 MG immediate release tablet [id number]  Take 1 tablet (5 mg  total) by mouth every 4 (four) hours as needed for severe pain. Virginia Crews, MD  Active   potassium chloride SA (KLOR-CON M) 20 MEQ tablet [id number] Yes Take 2 tablets (40 mEq total) by mouth daily. Adan Sis, MD Taking Active   predniSONE (DELTASONE) 10  MG tablet [id number] Yes Take 1 tablet (10 mg total) by mouth daily with breakfast. Sindy Guadeloupe, MD Taking Active   warfarin (COUMADIN) 2 MG tablet [id number] Yes Take 1 tablet (2 mg total) by mouth daily at 4 PM. Nsiah, Oris Drone, MD Taking Active             SDOH:  (Social Determinants of Health) assessments and interventions performed: {yes/no:20286} SDOH Interventions    Flowsheet Row Telephone from 09/17/2022 in [place] Coordination Office Visit from 08/09/2022 in [place] Office Visit from 04/23/2022 in [place] Office Visit from 02/04/2022 in [place] Housing from 10/29/2021 in [place] from 10/21/2020 in [place] Interventions        Food Insecurity Interventions Intervention Not Indicated -- -- -- Intervention Not Indicated --  Housing Interventions Intervention Not Indicated -- -- -- Intervention Not Indicated --  Transportation Interventions -- -- -- -- Intervention Not Indicated --  Depression Interventions/Treatment  -- PHQ2-9 Score <4 Follow-up Not Indicated PHQ2-9 Score <4 Follow-up Not Indicated PHQ2-9 Score <4 Follow-up Not Indicated -- --  Financial Strain Interventions -- -- -- -- Intervention Not Indicated --  Physical Activity Interventions -- -- -- -- Intervention Not Indicated Other (Comments), Patient Refused  [Does PT exercises 3 days a week for 10 minutes at a time.]  Stress Interventions -- -- -- -- Intervention Not Indicated --  Social Connections Interventions -- -- -- -- Intervention Not  Indicated --       Medication Assistance: {MEDASSISTANCEINFO:25044}  Medication Access: Within the past 30 days, how often has patient missed a dose of medication? *** Is a pillbox or other method used to improve adherence? {YES/NO:21197} Factors that may affect medication adherence? {CHL DESC; BARRIERS:21522} Are meds synced by current pharmacy? {YES/NO:21197} Are meds delivered by current pharmacy? {YES/NO:21197} Does patient experience delays in picking up medications due to transportation concerns? {YES/NO:21197}  [facility] Reviewed: Is patient disadvantaged to use [facility]?: {YES/NO:21197} Current Rx insurance plan: *** Name and location of Current pharmacy:  [facility] [address] ([address]), [place]) [place] [address] Phone: [phone] Fax: [phone]  [facility] services reviewed with patient today?: {YES/NO:21197} Patient requests to transfer care to [facility]?: {YES/NO:21197} Reason patient declined to change pharmacies: {US patient preference:27474}  Compliance/Adherence/Medication fill history: Care Gaps: ***  Star-Rating Drugs: ***   Assessment/Plan   {CCM PHARMD DISEASE STATES:25130}  ***

## 2022-10-04 NOTE — ED Notes (Signed)
Family at bedside. 

## 2022-10-04 NOTE — TOC Initial Note (Signed)
Transition of Care [facility]) - Initial/Assessment Note    Patient Details  Name: Vanessa Carlson MRN: [id number] Date of Birth: 07-11-1961  [facility] of [facility] CM/SW Contact:    Shelbie Hutching, RN Phone Number: 10/04/2022, 2:00 PM  Clinical Narrative:                 Patient admitted to the hospital with acute metabolic encephalopathy, history of lung cancer with mets.  Vonna Kotyk Borders with Palliative through the cancer center met with patient and family at the bedside in the emergency room.  Family has decided on hospice.  RNCM met with patient and family at the bedside, initially they were thinking of hospice at home but they changed their minds and would like to see if patient qualifies for the hospice home.  [place] given referral for hospice home with [facility].     Expected Discharge Plan: [place] Barriers to Discharge: Continued Medical Work up   Patient Goals and CMS Choice Patient states their goals for this hospitalization and ongoing recovery are:: Family would like to see if patient qualifies for hospice facility CMS Medicare.gov Compare Post Acute Care list provided to:: Patient Represenative (must comment) Choice offered to / list presented to : Adult Children      Expected Discharge Plan and Services   Discharge Planning Services: CM Consult Post Acute Care Choice: Hospice Living arrangements for the past 2 months: Single Family Home                                      Prior Living Arrangements/Services Living arrangements for the past 2 months: Single Family Home Lives with:: Spouse Patient language and need for interpreter reviewed:: Yes Do you feel safe going back to the place where you live?: Yes      Need for Family Participation in Patient Care: Yes (Comment) Care giver support system in place?: Yes (comment)   Criminal Activity/Legal Involvement Pertinent to Current Situation/Hospitalization: No - Comment as  needed  Activities of Daily Living Home Assistive Devices/Equipment: Dentures (specify type) ADL Screening (condition at time of admission) Patient's cognitive ability adequate to safely complete daily activities?: Yes Is the patient deaf or have difficulty hearing?: No Does the patient have difficulty seeing, even when wearing glasses/contacts?: No Does the patient have difficulty concentrating, remembering, or making decisions?: Yes Patient able to express need for assistance with ADLs?: Yes Does the patient have difficulty dressing or bathing?: No Independently performs ADLs?: No Communication: Independent Dressing (OT): Independent Grooming: Independent Feeding: Independent Bathing: Needs assistance Is this a change from baseline?: Change from baseline, expected to last >3 days Toileting: Needs assistance Is this a change from baseline?: Change from baseline, expected to last >3days In/Out Bed: Needs assistance Is this a change from baseline?: Change from baseline, expected to last >3 days Walks in Home: Needs assistance Is this a change from baseline?: Change from baseline, expected to last >3 days Does the patient have difficulty walking or climbing stairs?: Yes Weakness of Legs: Both Weakness of Arms/Hands: Both  Permission Sought/Granted Permission sought to share information with : Case Manager, Family Supports, Customer service manager    Share Information with NAME: Aurora Rody     Permission granted to share info w Relationship: spouse  Permission granted to share info w Contact Information: [phone]  Emotional Assessment Appearance:: Appears older than stated age Attitude/Demeanor/Rapport: Lethargic Affect (  typically observed): Accepting Orientation: : Oriented to Self, Oriented to Situation Alcohol / Substance Use: Not Applicable Psych Involvement: No (comment)  Admission diagnosis:  Acute metabolic encephalopathy [U76.54] Patient Active  Problem List   Diagnosis Date Noted   Acute metabolic encephalopathy 65/10/5463   Metastasis to brain,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[place]) 10/04/2022   Stage 3a chronic kidney disease ([place]) 10/04/2022   Acute respiratory failure with hypoxia (HCC) 10/04/2022   Palliative care encounter 10/04/2022   Hemoptysis 09/16/2022   Supratherapeutic INR 09/10/2022   Pleural effusion on right 09/10/2022   Hypotension 09/10/2022   AKI (acute kidney injury) ([place]) 09/10/2022   Elevated bilirubin 09/10/2022   Prediabetes 08/09/2022   Abnormal brain MRI 07/12/2022   Lung cancer, primary, with metastasis from lung to other site, right ([place]) 06/08/2022   Goals of care, counseling/discussion 06/08/2022   NSVT (nonsustained ventricular tachycardia) ([place]) 05/20/2022   Palpitations 05/20/2022   Tinnitus of right ear 04/23/2022   Skin lesion 04/23/2022   History of total knee arthroplasty 12/11/2021   Cardiomyopathy ([place]) 10/29/2021   Mitral valve disorder 10/29/2021   Osteoarthritis of knee 09/15/2021   Enlarged thyroid 08/04/2021   Overactive bladder 08/04/2021   Severe sepsis ([place]) 05/28/2021   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[place]) 12/14/2019   Lung cancer ([place]) 05/31/2018   Hyperlipidemia LDL goal <70 03/22/2018   Morbid obesity ([place]) 12/22/2017   Insomnia 12/07/2017   OA (osteoarthritis) of knee 07/08/2017   Chronic anticoagulation 07/08/2017   Paroxysmal atrial fibrillation (HCC)    Ischemic cardiomyopathy 02/24/2017   H/O mitral valve replacement with mechanical valve 02/24/2017   Coronary artery disease involving native heart    Chronic HFrEF (heart failure with reduced ejection fraction) ([place]) 01/02/2017   HTN (hypertension) 01/02/2017   PCP:  Virginia Crews, MD Pharmacy:   [facility] [address] ([address]), [place] - [address] [place]) [place] [address] Phone: [phone] Fax: [phone]     Social Determinants of Health (SDOH) Social  History: SDOH Screenings   Food Insecurity: No Food Insecurity (10/04/2022)  Housing: Low Risk  (10/04/2022)  Transportation Needs: No Transportation Needs (10/04/2022)  Utilities: Not At Risk (10/04/2022)  Alcohol Screen: Low Risk  (08/09/2022)  Depression (PHQ2-9): Low Risk  (08/09/2022)  Financial Resource Strain: Low Risk  (10/29/2021)  Physical Activity: Insufficiently Active (10/29/2021)  Social Connections: Moderately Integrated (10/29/2021)  Stress: No Stress Concern Present (10/29/2021)  Tobacco Use: Medium Risk (10/04/2022)   SDOH Interventions:     Readmission Risk Interventions     No data to display

## 2022-10-04 NOTE — Progress Notes (Signed)
PROGRESS NOTE  Vanessa Carlson:[id number] DOB: [birth year] DOA: 10/04/2022 PCP: Vanessa Crews, MD  Hospital Course/Subjective: Vanessa Carlson is a 62 y.o. female with medical history significant for CAD, ischemic cardiomyopathy status post AICD placement, rheumatic heart disease status post mechanical mitral valve replacement on Coumadin diagnosed with recurrent right lung cancer in January 2024 after presenting with a large pleural effusion that required thoracentesis, who was brought to the ED with a several day history of confusion and overall decline.    Evaluation in the ER revealed multiple hyerdensities in the brain which may be hemorrhagic. Currently patient is lying in bed resting, per RN was recently moaning in pain.  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 Continue dexamethasone and Keppra MR brain w wo contrast Neurochecks  Severe sepsis (HCC) Suspect hospital-acquired pneumonia Pleural effusion on right Acute respiratory failure with hypoxia Sepsis fluids Empiric cefepime vancomycin and azithromycin Supplemental oxygen, albuterol as needed, antitussives IR consult for thoracentesis with cultures   Chronic anticoagulation History of mitral valve replacement with mechanical valve Patient is on Coumadin with therapeutic INR of 2.7 Complex case in the setting of hemorrhagic brain metastases Palliative care consulted ED MD spoke with Dr. Tasia Carlson who recommends continue coumadin for now until seen by Dr. Janese Carlson this AM  Chronic HFrEF (heart failure with reduced ejection fraction) (HCC) Ischemic cardiomyopathy Clinically euvolemic Continue metoprolol  Stage 3a chronic kidney disease (Vanessa Carlson) Renal function at baseline  DVT Prophylaxis: On Coumadin  Code Status: FULL   Family  Communication: Daughter at bedside in ER this AM.   Disposition Plan: Inpatient admission to [facility]. Dispo pending clinical course.   Consultants: Onc Pall Care   Procedures: None   Antimicrobials: Anti-infectives (From admission, onward)    Start     Dose/Rate Route Frequency Ordered Stop   10/05/22 0800  vancomycin (VANCOREADY) IVPB 750 mg/150 mL        750 mg 150 mL/hr over 60 Minutes Intravenous Every 24 hours 10/04/22 0542     10/04/22 1800  ceFEPIme (MAXIPIME) 2 g in sodium chloride 0.9 % 100 mL IVPB        2 g 200 mL/hr over 30 Minutes Intravenous Every 12 hours 10/04/22 0518     10/04/22 0530  vancomycin (VANCOCIN) IVPB 1000 mg/200 mL premix  Status:  Discontinued        1,000 mg 200 mL/hr over 60 Minutes Intravenous  Once 10/04/22 0518 10/04/22 0522   10/04/22 0500  vancomycin (VANCOREADY) IVPB 2000 mg/400 mL        2,000 mg 200 mL/hr over 120 Minutes Intravenous  Once 10/04/22 0437     10/04/22 0445  vancomycin (VANCOCIN) IVPB 1000 mg/200 mL premix  Status:  Discontinued        1,000 mg 200 mL/hr over 60 Minutes Intravenous  Once 10/04/22 0435 10/04/22 0437   10/04/22 0445  ceFEPIme (MAXIPIME) 2 g in sodium chloride 0.9 % 100 mL IVPB        2 g 200 mL/hr over 30 Minutes Intravenous  Once 10/04/22 0435 10/04/22 0641       Objective: Vitals:   10/04/22 0530 10/04/22 0600 10/04/22 0609 10/04/22 0630  BP: (!) 144/87 134/84  132/78  Pulse: (!) 121 (!) 115  (!) 116  Resp: (!) 23 (!) 23  (!) 24  Temp:   97.9 F (36.6 C)  TempSrc:   Axillary   SpO2: 100% 100%  95%    Intake/Output Summary (Last 24 hours) at 10/04/2022 0740 Last data filed at 10/04/2022 0641 Gross per 24 hour  Intake 200 ml  Output --  Net 200 ml   There were no vitals filed for this visit. Exam: General:  Alert, oriented to self, somnolent Neck: supple, no masses, trachea mildline  Cardiovascular: RRR, no murmurs or rubs, no peripheral edema  Respiratory: clear to auscultation  bilaterally, no wheezes, no crackles  Abdomen: soft, nontender, nondistended, normal bowel tones heard  Skin: dry, no rashes  Musculoskeletal: no joint effusions, normal range of motion  Neurologic: extraocular muscles intact, clear speech, moving all extremities with intact sensorium   Data Reviewed: CBC: Recent Labs  Lab 09/27/22 1002 10/04/22 0224  WBC 15.1* 23.2*  NEUTROABS 11.4* 17.0*  HGB 13.1 12.6  HCT 40.7 39.9  MCV 89.5 89.9  PLT 226 536   Basic Metabolic Panel: Recent Labs  Lab 09/27/22 1002 10/04/22 0224  NA 135 134*  K 3.4* 4.0  CL 98 97*  CO2 24 23  GLUCOSE 174* 131*  BUN 29* 24*  CREATININE 1.30* 1.21*  CALCIUM 9.0 9.2   GFR: Estimated Creatinine Clearance: 50.2 mL/min (A) (by C-G formula based on SCr of 1.21 mg/dL (H)). Liver Function Tests: Recent Labs  Lab 09/27/22 1002 10/04/22 0224  AST 60* 51*  ALT 23 20  ALKPHOS 279* 444*  BILITOT 1.8* 2.3*  PROT 8.4* 8.0  ALBUMIN 3.9 3.4*   Recent Labs  Lab 10/04/22 0224  LIPASE 37   No results for input(s): "AMMONIA" in the last 168 hours. Coagulation Profile: Recent Labs  Lab 09/29/22 1618 10/04/22 0224  INR 6.8* 2.7*   Cardiac Enzymes: Recent Labs  Lab 10/04/22 0224  CKTOTAL 68   BNP (last 3 results) No results for input(s): "PROBNP" in the last 8760 hours. HbA1C: No results for input(s): "HGBA1C" in the last 72 hours. CBG: Recent Labs  Lab 10/04/22 0156  GLUCAP 128*   Lipid Profile: No results for input(s): "CHOL", "HDL", "LDLCALC", "TRIG", "CHOLHDL", "LDLDIRECT" in the last 72 hours. Thyroid Function Tests: Recent Labs    10/04/22 0224  TSH 3.915   Anemia Panel: No results for input(s): "VITAMINB12", "FOLATE", "FERRITIN", "TIBC", "IRON", "RETICCTPCT" in the last 72 hours. Urine analysis:    Component Value Date/Time   COLORURINE YELLOW (A) 09/11/2022 0622   APPEARANCEUR HAZY (A) 09/11/2022 0622   APPEARANCEUR Clear 06/21/2022 1101   LABSPEC 1.017 09/11/2022 0622    PHURINE 5.0 09/11/2022 0622   GLUCOSEU NEGATIVE 09/11/2022 0622   HGBUR NEGATIVE 09/11/2022 0622   BILIRUBINUR NEGATIVE 09/11/2022 0622   BILIRUBINUR Negative 06/21/2022 1101   KETONESUR NEGATIVE 09/11/2022 0622   PROTEINUR 30 (A) 09/11/2022 0622   NITRITE NEGATIVE 09/11/2022 0622   LEUKOCYTESUR NEGATIVE 09/11/2022 0622   Sepsis Labs: @LABRCNTIP (procalcitonin:4,lacticidven:4)  ) Recent Results (from the past 240 hour(s))  Resp Panel by RT-PCR (Flu A&B, Covid) Anterior Nasal Swab     Status: None   Collection Time: 10/04/22  2:24 AM   Specimen: Anterior Nasal Swab  Result Value Ref Range Status   SARS Coronavirus 2 by RT PCR NEGATIVE NEGATIVE Final    Comment: (NOTE) SARS-CoV-2 target nucleic acids are NOT DETECTED.  The SARS-CoV-2 RNA is generally detectable in upper respiratory specimens during the acute phase of infection. The lowest concentration of SARS-CoV-2 viral copies this assay can detect is 138 copies/mL. A negative result does not preclude SARS-Cov-2  infection and should not be used as the sole basis for treatment or other patient management decisions. A negative result may occur with  improper specimen collection/handling, submission of specimen other than nasopharyngeal swab, presence of viral mutation(s) within the areas targeted by this assay, and inadequate number of viral copies(<138 copies/mL). A negative result must be combined with clinical observations, patient history, and epidemiological information. The expected result is Negative.  Fact Sheet for Patients:  EntrepreneurPulse.com.au  Fact Sheet for Healthcare Providers:  IncredibleEmployment.be  This test is no t yet approved or cleared by the [place] FDA and  has been authorized for detection and/or diagnosis of SARS-CoV-2 by FDA under an Emergency Use Authorization (EUA). This EUA will remain  in effect (meaning this test can be used) for the duration of  the COVID-19 declaration under Section 564(b)(1) of the Act, 21 U.S.C.section 360bbb-3(b)(1), unless the authorization is terminated  or revoked sooner.       Influenza A by PCR NEGATIVE NEGATIVE Final   Influenza B by PCR NEGATIVE NEGATIVE Final    Comment: (NOTE) The Xpert Xpress SARS-CoV-2/FLU/RSV plus assay is intended as an aid in the diagnosis of influenza from Nasopharyngeal swab specimens and should not be used as a sole basis for treatment. Nasal washings and aspirates are unacceptable for Xpert Xpress SARS-CoV-2/FLU/RSV testing.  Fact Sheet for Patients: EntrepreneurPulse.com.au  Fact Sheet for Healthcare Providers: IncredibleEmployment.be  This test is not yet approved or cleared by the [place] FDA and has been authorized for detection and/or diagnosis of SARS-CoV-2 by FDA under an Emergency Use Authorization (EUA). This EUA will remain in effect (meaning this test can be used) for the duration of the COVID-19 declaration under Section 564(b)(1) of the Act, 21 U.S.C. section 360bbb-3(b)(1), unless the authorization is terminated or revoked.  Performed at [facility], [address] [place], [place] [address]   Blood culture (routine x 2)     Status: None (Preliminary result)   Collection Time: 10/04/22  3:23 AM   Specimen: BLOOD LEFT HAND  Result Value Ref Range Status   Specimen Description BLOOD LEFT HAND  Final   Special Requests   Final    BOTTLES DRAWN AEROBIC AND ANAEROBIC Blood Culture adequate volume   Culture   Final    NO GROWTH < 12 HOURS Performed at [facility], [address] [place], [place] [address]    Report Status PENDING  Incomplete  Blood culture (routine x 2)     Status: None (Preliminary result)   Collection Time: 10/04/22  3:23 AM   Specimen: BLOOD LEFT HAND  Result Value Ref Range Status   Specimen Description BLOOD LEFT HAND  Final   Special Requests   Final     BOTTLES DRAWN AEROBIC AND ANAEROBIC Blood Culture adequate volume   Culture   Final    NO GROWTH < 12 HOURS Performed at [facility], [address], [place], [place] [address]    Report Status PENDING  Incomplete     Studies: CT Angio Chest PE W and/or Wo Contrast  Result Date: 10/04/2022 CLINICAL DATA:  Pulmonary embolism suspected EXAM: CT ANGIOGRAPHY CHEST WITH CONTRAST TECHNIQUE: Multidetector CT imaging of the chest was performed using the standard protocol during bolus administration of intravenous contrast. Multiplanar CT image reconstructions and MIPs were obtained to evaluate the vascular anatomy. RADIATION DOSE REDUCTION: This exam was performed according to the departmental dose-optimization program which includes automated exposure control, adjustment of the mA and/or  kV according to patient size and/or use of iterative reconstruction technique. CONTRAST:  28mL OMNIPAQUE IOHEXOL 350 MG/ML SOLN COMPARISON:  Chest CT 09/10/2022 FINDINGS: Cardiovascular: Satisfactory opacification of the pulmonary arteries to the segmental level. No evidence of pulmonary embolism. Cardiomegaly with thinning of the anterior wall and apex of the left ventricle where there is faint calcification. Mitral valve replacement. Coronary atherosclerosis Mediastinum/Nodes: Right paratracheal adenopathy with rounded 12 mm node similar to prior. No hematoma. Lungs/Pleura: Right apical mass with borders poorly defined by arterial timing, ~ 7 cm in size with fracturing/erosion of lateral right second and third ribs. Large right pleural effusion which appears complicated with layering debris. The right lower lobe is collapsed. Patchy nodules in the left lung especially the upper lobe, also seen on prior. Upper Abdomen: No acute finding. Ill-defined medial segment right liver lesion measuring approximately 1 cm. Musculoskeletal: Eroded anterior to lateral right second and third ribs which are closely approximating the  right upper lobe mass. Diffuse heterogeneity of the spine attributed to widespread metastases. Review of the MIP images confirms the above findings. IMPRESSION: 1. Negative for pulmonary embolism. 2. Known right upper lobe malignancy with osseous, pulmonary, and possibly hepatic metastatic spread. Continued large right pleural effusion with lower lobe collapse. Electronically Signed   By: Jorje Guild M.D.   On: 10/04/2022 04:21   CT Head Wo Contrast  Result Date: 10/04/2022 CLINICAL DATA:  Altered mental status EXAM: CT HEAD WITHOUT CONTRAST TECHNIQUE: Contiguous axial images were obtained from the base of the skull through the vertex without intravenous contrast. RADIATION DOSE REDUCTION: This exam was performed according to the departmental dose-optimization program which includes automated exposure control, adjustment of the mA and/or kV according to patient size and/or use of iterative reconstruction technique. COMPARISON:  None Available. FINDINGS: Brain: There are numerous (at least 25) hyperdense lesions scattered throughout the brain. The largest abuts the right lateral ventricle and measures 10 mm. There is no edema. Vascular: No abnormal hyperdensity of the major intracranial arteries or dural venous sinuses. No intracranial atherosclerosis. Skull: The visualized skull base, calvarium and extracranial soft tissues are normal. Sinuses/Orbits: No fluid levels or advanced mucosal thickening of the visualized paranasal sinuses. No mastoid or middle ear effusion. The orbits are normal. 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 These are likely hemorrhagic, but acuity is uncertain. Critical Value/emergent results were called by telephone at the time of interpretation on 10/04/2022 at 4:13 am to provider MARK QUALE , who verbally acknowledged these results. Electronically Signed   By: Ulyses Jarred M.D.   On: 10/04/2022 04:13   DG Chest Portable 1  View  Result Date: 10/04/2022 CLINICAL DATA:  Hypoxia EXAM: PORTABLE CHEST 1 VIEW COMPARISON:  09/13/2022 FINDINGS: Unchanged position of left chest wall AICD. There is a right chest wall power-injectable Port-A-Cath with tip at the cavoatrial junction via a right internal jugular vein approach. Large right pleural effusion with near complete opacification of the right hemithorax, worsened. Left lung is clear. Cardiomediastinal contours are normal. Remote median sternotomy and valvuloplasty. IMPRESSION: Large right pleural effusion with near complete opacification of the right hemithorax, worsened from prior. Electronically Signed   By: Ulyses Jarred M.D.   On: 10/04/2022 02:54    Scheduled Meds:  dexamethasone (DECADRON) injection  4 mg Intravenous Q6H    Continuous Infusions:  ceFEPime (MAXIPIME) IV     lactated ringers 150 mL/hr at 10/04/22 0554   levETIRAcetam Stopped (10/04/22 0557)   vancomycin 2,000 mg (  10/04/22 0600)   [START ON 10/05/2022] vancomycin       LOS: 0 days   Time spent: 31 minutes  Soliyana Mcchristian Neva Seat, MD [facility] Pager [phone]  If 7PM-7AM, please contact night-coverage [email or web address] Password [facility] 10/04/2022, 7:40 AM

## 2022-10-04 NOTE — Consult Note (Signed)
CODE SEPSIS - PHARMACY COMMUNICATION  **Broad Spectrum Antibiotics should be administered within 1 hour of Sepsis diagnosis**  Time Code Sepsis Called/Page Received: 4784  Antibiotics Ordered: cefepime, vancomycin  Time of 1st antibiotic administration: 0555  Additional action taken by pharmacy: contacted RN  If necessary, Name of Provider/Nurse Contacted: Natasha Bence, RN     Dorothe Pea ,PharmD Clinical Pharmacist  10/04/2022  4:36 AM

## 2022-10-04 NOTE — IPAL (Signed)
  Interdisciplinary Goals of Care Family Meeting   Date carried out: 10/04/2022  Location of the meeting: Bedside  Member's involved: Physician, Bedside Registered Nurse, and Family Member or next of kin  Durable Power of Attorney or acting medical decision maker: Present in making decisions with the patient's husband, son and daughter.  Discussion: We discussed goals of care for Breindel K Schriner .  Meeting with the entire family, was able to review the patient's current medical status, and severity of her presentation.  Family mentions that they had already been thinking about pursuing hospice care for the patient, before they discovered the intracranial hemorrhage early this morning.  They are currently awaiting discussion with hospice, and they are interested in admission to the hospice house if appropriate.  Upon further discussion, patient's husband, son and daughter are unanimously requesting DNR status.  Code status:   Code Status: DNR , ordered in EMR and [place] completed.  Disposition: Family is anticipating hospice house.  Time spent for the meeting: 40 minutes    Pyper Olexa Neva Seat, MD  10/04/2022, 2:31 PM

## 2022-10-04 NOTE — ED Notes (Signed)
Called [facility] for transport to [place], for 2100 pick up per [facility] RN/Liaison

## 2022-10-05 ENCOUNTER — Inpatient Hospital Stay: Payer: Medicare HMO

## 2022-10-05 ENCOUNTER — Inpatient Hospital Stay: Payer: Medicare HMO | Admitting: Oncology

## 2022-10-07 ENCOUNTER — Ambulatory Visit: Payer: Medicare HMO | Admitting: Internal Medicine

## 2022-10-07 ENCOUNTER — Inpatient Hospital Stay: Payer: Medicare HMO

## 2022-10-07 LAB — BODY FLUID CULTURE W GRAM STAIN: Culture: NO GROWTH

## 2022-10-09 LAB — CULTURE, BLOOD (ROUTINE X 2)
Culture: NO GROWTH
Culture: NO GROWTH
Special Requests: ADEQUATE
Special Requests: ADEQUATE

## 2022-10-10 LAB — LACTIC ACID, PLASMA: Lactic Acid, Venous: 2.2 mmol/L (ref 0.5–1.9)

## 2022-10-11 ENCOUNTER — Ambulatory Visit: Payer: Medicare HMO

## 2022-10-12 NOTE — Progress Notes (Unsigned)
Care Management & Coordination Services Pharmacy Note  10/12/2022 Name:  Vanessa Carlson MRN:  [id number] DOB:  03/30/1961  Summary: ***  Recommendations/Changes made from today's visit: ***  Follow up plan: ***   Subjective: Vanessa Carlson is an 62 y.o. year old female who is a primary patient of Bacigalupo, Dionne Bucy, MD.  The care coordination team was consulted for assistance with disease management and care coordination needs.    {CCMTELEPHONEFACETOFACE:21091510} for {CCMINITIALFOLLOWUPCHOICE:21091511}.  Recent office visits: 08/09/2022 Dr. Brita Romp MD (PCP) Medicare wellness completed, No medication changes noted, return in 6 month 04/23/2022 Mikey Kirschner PA-C (PCP Office) start triamcinolone cream (KENALOG) 0.1 %  Recent consult visits: 09/30/2022 Altha Harm NP (Oncology) Start olanzapine  5 MG tablet daily  09/27/2022  Dr. Janese Banks MD (Oncology) No medication changes noted 09/20/2022 Dr. Janese Banks MD (Oncology) No medication changes noted 09/10/2022 Dr. Janese Banks MD (Oncology) No Medication changes noted 08/31/2022 Altha Harm NP (Oncology) Discontinue tramadol  08/20/2022 Dr. Janese Banks MD (Oncology) No medication changes noted 08/16/2022 Dr. Janese Banks MD (Oncology) start amoxicillin-clavulanate 503-546 MG tablet 07/30/2022 Nelwyn Salisbury PA-C (Emergency medicine) No medication changes noted 07/19/2022 Virl Axe (Cardiology) Unable to see note 07/12/2022 Dr. Mickeal Skinner MD (Oncology) No Medication changes noted 07/07/2022 Dr. Saunders Revel MD (Cardiology)Stop metolazone,Return to clinic in 3 months  07/06/2022 Virl Axe (Cardiology) Unable to see note 06/25/2022 Dr. Janese Banks (Oncology) No Medication changes noted 06/21/2022 Dr. Matilde Sprang MD (urology) start oxybutynin 10 MG daily, 06/14/2022 Sharman Cheek Rn (Cardiology)No Medication changes noted, follow up 3 month 06/08/2022 Dr. Janese Banks (Oncology) No Medication changes noted 05/25/2022 Dr. Janese Banks (Oncology) No Medication changes noted 05/20/2022 Dr.  Saunders Revel MD (Cardiology) Start metolazone 2.5 mg once a week , Start potassium chloride SA  20 MEQ tablet, Return to clinic in 2 to 3 weeks  05/04/2022 Sharman Cheek RN (Cardiology) No Medication changes noted 05/04/2022 Virl Axe (Cardiology) Unable to see note 04/26/2022 Dr. Matilde Sprang MD (urology) Stop Trazodone 50 mg  04/06/2022 Virl Axe (Cardiology) Unable to see note  Hospital visits: Admitted to the hospital on 09/27/2022 due to Generalized pain. Discharge date was 09/28/2022. Discharged from [facility].     Admitted to the hospital on 09/10/2022 due to Supratherapeutic INR. Discharge date was 09/16/2022. Discharged from [facility]    Objective:  Lab Results  Component Value Date   CREATININE 1.21 (H) 10/04/2022   BUN 24 (H) 10/04/2022   GFRNONAA 51 (L) 10/04/2022   GFRAA >60 03/19/2020   NA 134 (L) 10/04/2022   K 4.0 10/04/2022   CALCIUM 9.2 10/04/2022   CO2 23 10/04/2022   GLUCOSE 131 (H) 10/04/2022    Lab Results  Component Value Date/Time   HGBA1C 6.2 (H) 08/20/2022 01:50 PM   HGBA1C 5.9 (H) 12/14/2019 09:09 AM    Last diabetic Eye exam: No results found for: "HMDIABEYEEXA"  Last diabetic Foot exam: No results found for: "HMDIABFOOTEX"   Lab Results  Component Value Date   CHOL 112 08/20/2022   HDL 23 (L) 08/20/2022   LDLCALC 69 08/20/2022   TRIG 100 08/20/2022   CHOLHDL 4.9 08/20/2022       Latest Ref Rng & Units 10/04/2022    2:24 AM 09/27/2022   10:02 AM 09/20/2022    9:03 AM  Hepatic Function  Total Protein 6.5 - 8.1 g/dL 8.0  8.4  8.2   Albumin 3.5 - 5.0 g/dL 3.4  3.9  3.8   AST 15 - 41 U/L 51  60  57  ALT 0 - 44 U/L 20  23  16    Alk Phosphatase 38 - 126 U/L 444  279  249   Total Bilirubin 0.3 - 1.2 mg/dL 2.3  1.8  1.6     Lab Results  Component Value Date/Time   TSH 3.915 10/04/2022 02:24 AM   TSH 1.937 09/27/2022 10:02 AM       Latest Ref Rng & Units 10/04/2022    2:24 AM 09/27/2022   10:02 AM  09/20/2022    9:03 AM  CBC  WBC 4.0 - 10.5 K/uL 23.2  15.1  12.0   Hemoglobin 12.0 - 15.0 g/dL 12.6  13.1  12.8   Hematocrit 36.0 - 46.0 % 39.9  40.7  38.4   Platelets 150 - 400 K/uL 200  226  151     No results found for: "VD25OH", "VITAMINB12"  Clinical ASCVD: {YES/NO:21197} The ASCVD Risk score (Arnett DK, et al., 2019) failed to calculate for the following reasons:   The patient has a prior MI or stroke diagnosis    ***Other: (CHADS2VASc if Afib, MMRC or CAT for COPD, ACT, DEXA)     08/09/2022    1:39 PM 04/23/2022    9:07 AM 02/04/2022    1:57 PM  Depression screen PHQ 2/9  Decreased Interest 1 0 0  Down, Depressed, Hopeless 0 0 0  PHQ - 2 Score 1 0 0  Altered sleeping 0 2 1  Tired, decreased energy 0 2 0  Change in appetite 2 0 0  Feeling bad or failure about yourself  0 0 0  Trouble concentrating 0 0 0  Moving slowly or fidgety/restless 0 0 0  Suicidal thoughts 0 0 0  PHQ-9 Score 3 4 1   Difficult doing work/chores Not difficult at all Not difficult at all Not difficult at all     Social History   Tobacco Use  Smoking Status Former   Packs/day: 1.00   Years: 4.00   Total pack years: 4.00   Types: Cigarettes   Quit date: 49   Years since quitting: 26.1   Passive exposure: Past  Smokeless Tobacco Never   BP Readings from Last 3 Encounters:  10/04/22 104/75  10/01/22 130/80  09/27/22 135/84   Pulse Readings from Last 3 Encounters:  10/04/22 91  10/01/22 (!) 117  09/27/22 (!) 114   Wt Readings from Last 3 Encounters:  10/04/22 196 lb 3.4 oz (89 kg)  09/20/22 198 lb (89.8 kg)  09/11/22 203 lb 14.8 oz (92.5 kg)   BMI Readings from Last 3 Encounters:  10/04/22 35.89 kg/m  09/20/22 36.21 kg/m  09/11/22 37.30 kg/m    No Known Allergies  Medications Reviewed Today     Reviewed by Rosana Berger, CPhT (Pharmacy Technician) on 10/04/22 at 1143  Med List Status: Complete   Medication Order Taking? Sig Documenting Provider Last Dose Status  Informant  aspirin 81 MG chewable tablet [id number] Yes Chew 81 mg by mouth daily. [provider] 10/03/2022 Active Self  atorvastatin (LIPITOR) 80 MG tablet [id number] Yes TAKE 1 TABLET BY MOUTH ONCE DAILY. OFFICE VISIT AND LABS NEEDED FOR FURTHER REFILLS. Virginia Crews, MD 10/03/2022 Active Self, Pharmacy Records  docusate sodium (COLACE) 250 MG capsule [id number] Yes Take 250 mg by mouth daily. [provider] 10/03/2022 Active   ezetimibe (ZETIA) 10 MG tablet [id number] Yes Take 1 tablet (10 mg total) by mouth daily. [name], MD 10/03/2022 Active Self, Pharmacy Records  furosemide (LASIX) 40 MG tablet  [id number] Yes Take 40 mg by mouth daily. [provider] 10/03/2022 Active   gabapentin (NEURONTIN) 300 MG capsule [id number] Yes Take 1 capsule (300 mg total) by mouth 2 (two) times daily. Virginia Crews, MD 10/03/2022 Active Self, Pharmacy Records  LORazepam (ATIVAN) 0.5 MG tablet [id number] Yes Take 1 tablet (0.5 mg total) by mouth every 6 (six) hours as needed (nausea). Sindy Guadeloupe, MD 10/03/2022 Active   metolazone (ZAROXOLYN) 2.5 MG tablet [id number] Yes Take 1 tablet (2.5 mg) by mouth once a week. Take 30 minutes prior to your furosemide dose. End, Harrell Gave, MD Past Week Active Self  metoprolol succinate (TOPROL-XL) 25 MG 24 hr tablet [id number] Yes TAKE 1 TABLET BY MOUTH TWICE DAILY IN THE MORNING AND AT BEDTIME End, Harrell Gave, MD 10/03/2022 Active Self, Pharmacy Records  midodrine (PROAMATINE) 10 MG tablet [id number] Yes Take 1 tablet (10 mg total) by mouth 3 (three) times daily with meals. Adan Sis, MD 10/03/2022 Active   OLANZapine (ZYPREXA) 5 MG tablet [id number] No Take 1 tablet (5 mg total) by mouth at bedtime.  Patient not taking: Reported on 10/04/2022   Borders, Kirt Boys, NP Not Taking Active   ondansetron (ZOFRAN) 4 MG tablet [id number] Yes Take 1 tablet (4 mg total) by mouth every 8 (eight) hours as needed for nausea or vomiting. Sindy Guadeloupe, MD  Past Month Active   oxybutynin (DITROPAN-XL) 10 MG 24 hr tablet [id number] No Take 1 tablet (10 mg total) by mouth daily.  Patient not taking: Reported on 10/04/2022   Bjorn Loser, MD Not Taking Active Self  oxyCODONE (OXY IR/ROXICODONE) 5 MG immediate release tablet [id number]  Take 1 tablet (5 mg total) by mouth every 4 (four) hours as needed for severe pain. Sindy Guadeloupe, MD  Active   oxyCODONE (OXY IR/ROXICODONE) 5 MG immediate release tablet [id number] Yes Take 1 tablet (5 mg total) by mouth every 4 (four) hours as needed for severe pain. Virginia Crews, MD 10/03/2022 2300 Active   potassium chloride SA (KLOR-CON M) 20 MEQ tablet [id number] Yes Take 2 tablets (40 mEq total) by mouth daily. Adan Sis, MD 10/03/2022 Active   predniSONE (DELTASONE) 10 MG tablet [id number] No Take 1 tablet (10 mg total) by mouth daily with breakfast.  Patient not taking: Reported on 10/04/2022   Sindy Guadeloupe, MD Completed Course Active   warfarin (COUMADIN) 2 MG tablet [id number] Yes Take 1 tablet (2 mg total) by mouth daily at 4 PM. Adan Sis, MD 10/03/2022 Active             SDOH:  (Social Determinants of Health) assessments and interventions performed: {yes/no:20286} SDOH Interventions    Flowsheet Row Telephone from 09/17/2022 in [place] Visit from 08/09/2022 in [place] Office Visit from 04/23/2022 in [place] Office Visit from 02/04/2022 in [place] from 10/29/2021 in [place] from 10/21/2020 in [place]  SDOH Interventions        Food Insecurity Interventions Intervention Not Indicated -- -- -- Intervention Not Indicated --  Housing Interventions Intervention Not Indicated -- -- -- Intervention Not Indicated --  Transportation Interventions -- -- -- --  Intervention Not Indicated --  Depression Interventions/Treatment  -- PHQ2-9 Score <4 Follow-up Not Indicated PHQ2-9 Score <4 Follow-up Not Indicated PHQ2-9 Score <4 Follow-up Not Indicated -- --  Financial Strain Interventions -- -- -- --  Intervention Not Indicated --  Physical Activity Interventions -- -- -- -- Intervention Not Indicated Other (Comments), Patient Refused  [Does PT exercises 3 days a week for 10 minutes at a time.]  Stress Interventions -- -- -- -- Intervention Not Indicated --  Social Connections Interventions -- -- -- -- Intervention Not Indicated --       Medication Assistance: {MEDASSISTANCEINFO:25044}  Medication Access: Within the past 30 days, how often has patient missed a dose of medication? *** Is a pillbox or other method used to improve adherence? {YES/NO:21197} Factors that may affect medication adherence? {CHL DESC; BARRIERS:21522} Are meds synced by current pharmacy? {YES/NO:21197} Are meds delivered by current pharmacy? {YES/NO:21197} Does patient experience delays in picking up medications due to transportation concerns? {YES/NO:21197}  [facility] Reviewed: Is patient disadvantaged to use [facility]?: {YES/NO:21197} Current Rx insurance plan: *** Name and location of Current pharmacy:  [facility] [address] [place] [facility] [place]) [place] [address] Phone: [phone] Fax: [phone]  [facility] services reviewed with patient today?: {YES/NO:21197} Patient requests to transfer care to [facility]?: {YES/NO:21197} Reason patient declined to change pharmacies: {US patient preference:27474}  Compliance/Adherence/Medication fill history: Care Gaps: Annual wellness visit in last year? Yes,last completed on 10/29/2021 Dtap Vaccine Shingrix vaccine COVID-Vaccine  Star-Rating Drugs: Medication: Atorvastatin 80 mg Last Fill: 08/31/2022 90    Day Supply at  [facility] [employer].   Assessment/Plan   {CCM PHARMD DISEASE STATES:25130}  ***

## 2022-10-14 NOTE — Progress Notes (Signed)
Unable to reach patient for monthly ICM remote follow up.  She has been admitted to hospice house per 2/5 discharge summary.  No further ICM monthly follow up scheduled.

## 2022-10-18 ENCOUNTER — Ambulatory Visit: Payer: Medicare HMO

## 2022-10-18 ENCOUNTER — Ambulatory Visit: Payer: Medicare HMO | Admitting: Oncology

## 2022-10-18 ENCOUNTER — Other Ambulatory Visit: Payer: Medicare HMO

## 2022-10-25 ENCOUNTER — Ambulatory Visit: Payer: Medicare HMO

## 2022-10-25 ENCOUNTER — Ambulatory Visit: Payer: Medicare HMO | Admitting: Oncology

## 2022-10-25 ENCOUNTER — Other Ambulatory Visit: Payer: Medicare HMO

## 2022-10-26 NOTE — Discharge Summary (Addendum)
Physician Discharge Summary   Patient: Vanessa Carlson MRN: [id number] DOB: Sep 29, 1960  Admit date:     09/10/2022  Discharge date: 09/16/2022  Discharge Physician: Adan Sis   PCP: Virginia Crews, MD   Recommendations at discharge:   Check PT/INR daily until therapeutic and stable and check per coagulation clinic guidelines  Discharge Diagnoses: Principal Problem:   Supratherapeutic INR Active Problems:   Lung cancer, primary, with metastasis from lung to other site, right (HCC)   Pleural effusion on right   Chronic anticoagulation   HTN (hypertension)   Coronary artery disease involving native heart   Paroxysmal atrial fibrillation ([place])   Morbid obesity ([place])   Hyperlipidemia LDL goal <70   Hypotension   AKI (acute kidney injury) ([place])   Elevated bilirubin   Hemoptysis  Resolved problems Hemoptysis Supratherapeutic inr now therapeutic Hospital Course: [name] is a 62 year old female with primary lung adenocarcinoma on the right side, hypertension, hyperlipidemia, paroxysmal atrial fibrillation, on chronic anticoagulation with warfarin, who presents emergency department from oncology office for chief concerns of elevated INR.  Initial vitals in the emergency department showed temperature of 98.6, respiration rate of 17, heart rate of 102, blood pressure 102/64, SpO2 99% on room air.  Serum sodium is 137, potassium 3.5, chloride 98, bicarb 27, BUN of 20, serum creatinine 1.97, GFR of 28, nonfasting blood glucose 119, WBC 10.2, hemoglobin 13.8, platelets of 229.  INR was greater than 10.  At the request of Dr. Janese Banks, CT chest abdomen and pelvis without contrast was ordered: Read as enlarging right upper lobe mass consistent with progressive lung cancer.  Large multiloculated right pleural effusion, increased in size.  Compressive atelectasis within the right middle and lower lobe.  New and enlarging left lung pulmonary nodules consistent with progressive  metastatic disease.  Gallbladder sludge.  No evidence of cholelithiasis or cholecystitis.  Grossly stable borderline enlarged mediastinal adenopathy.  Indeterminate 8 mm hypodensity inferior right lobe liver, not well-seen on prior exam.  Metastatic disease cannot be excluded.  ED treatment: Vitamin K 2.5 P.o.  Assessment and Plan: * Supratherapeutic INR - Phytonadione 2.5 mg was discontinued and ordered 5 mg on admission, this was discontinued due to hypotension -  given Kcentra, vitamin K 10 mg on admission High inr reversed Hb of 11.1 at discharge did not require prbc transfusion.   Pleural effusion on right Multiloculated, increased in size from prior exam - pulmonary was consulted and patient had a right therapeutic thoracentesis where 1.3 litres of pleural fluid was drained.  AKI (acute kidney injury) ([place]) - With baseline CKD stage IIIa - Check BMP in the a.m.  Hypotension - Status post sodium chloride 500 mL bolus per EDP - Ordered additional sodium chloride 500 mL bolus - PCCM has been consulted via secure chat today to [employer] - Kcentra IV and vitamin K 10 mg and dextrose IV ordered stat; called pharmacy to confirm.  Restarted coumadin at lower dose.INR was therapeutic at the time of discharge.  Hyperlipidemia LDL goal <70 - Atorvastatin 80 mg daily resumed  Morbid obesity ([place]) - This complicates overall care and prognosis.   Paroxysmal atrial fibrillation (HCC) - Warfarin not resumed on admission  HTN (hypertension) - Antihypertensive medication Entresto, metoprolol succinate, spironolactone were not resumed admission due to hypotension - Patient states that she did not take these medications on the day of admission   Patient was seen by cardiology during this visit,inr was reversed as above. Hemoptysis did resolve.Patient was slowly  restarted on coumadin inr was therapeutic  And was discharged on a lower dose. Patient was hypotensive required iv fluid  resuscitation with normal saline. Entresto,lasix and aldactone were discontinued at discharge due to hypotension. Right therapeutic thoracentesis done on 09/13/2022 1.3 litres of pleural fluid drained.      Pain control - [employer] Controlled Substance Reporting System database was reviewed. and patient was instructed, not to drive, operate heavy machinery, perform activities at heights, swimming or participation in water activities or provide baby-sitting services while on Pain, Sleep and Anxiety Medications; until their outpatient Physician has advised to do so again. Also recommended to not to take more than prescribed Pain, Sleep and Anxiety Medications.  Consultants: cardiologist Procedures performed: 09/13/2022 Successful US guided therapeutic right thoracentesis. Yielded 1.3 L of dark, bloody fluid. Pt tolerated procedure well. No immediate complications Disposition: Home Diet recommendation:  Discharge Diet Orders (From admission, onward)     Start     Ordered   09/16/22 0000  Diet - low sodium heart healthy        09/16/22 1126           Cardiac diet DISCHARGE MEDICATION: Allergies as of 09/16/2022   No Known Allergies      Medication List     STOP taking these medications    Entresto 24-26 MG Generic drug: sacubitril-valsartan   furosemide 40 MG tablet Commonly known as: LASIX   potassium chloride SA 20 MEQ tablet Commonly known as: KLOR-CON M   spironolactone 25 MG tablet Commonly known as: ALDACTONE   warfarin 5 MG tablet Commonly known as: COUMADIN        Discharge Exam: Filed Weights   09/11/22 0332  Weight: 92.5 kg    General: Appearance:    Obese female in no acute distress  Eyes:    PERRL, conjunctiva/corneas clear, EOM's intact       Lungs:     Clear to auscultation bilaterally, respirations unlabored  Heart:    Irregularly irregular rhythm. No murmurs, rubs, or gallops.  A metallic click heard at the apex.   MS:   All  extremities are intact.   Neurologic:   Awake, alert, oriented x 3. No apparent focal neurological           defect.      Condition at discharge: good  The results of significant diagnostics from this hospitalization (including imaging, microbiology, ancillary and laboratory) are listed below for reference.   Imaging Studies: US THORACENTESIS ASP PLEURAL SPACE W/IMG GUIDE  Result Date: 10/04/2022 INDICATION: Patient with history of non-small cell lung cancer with recurrent right pleural effusion. Request received for diagnostic and therapeutic right thoracentesis. EXAM: ULTRASOUND GUIDED DIAGNOSTIC AND THERAPEUTIC RIGHT THORACENTESIS MEDICATIONS: 10 mL 1 % lidocaine COMPLICATIONS: None immediate. PROCEDURE: An ultrasound guided thoracentesis was thoroughly discussed with the patient and questions answered. The benefits, risks, alternatives and complications were also discussed. The patient understands and wishes to proceed with the procedure. Written consent was obtained. Ultrasound was performed to localize and mark an adequate pocket of fluid in the right chest. The area was then prepped and draped in the normal sterile fashion. 1% Lidocaine was used for local anesthesia. Under ultrasound guidance a 6 Fr Safe-T-Centesis catheter was introduced. Thoracentesis was performed. The catheter was removed and a dressing applied. FINDINGS: A total of approximately 900 cc of dark brown fluid was removed. Samples were sent to the laboratory as requested by the clinical team. IMPRESSION: Successful ultrasound guided right thoracentesis yielding  900 cc of pleural fluid. Read by: Narda Rutherford, AGNP-BC Electronically Signed   By: Corrie Mckusick D.O.   On: 10/04/2022 11:30   DG Chest Port 1 View  Result Date: 10/04/2022 CLINICAL DATA:  Recurrent right pleural effusion status post thoracentesis. EXAM: PORTABLE CHEST 1 VIEW COMPARISON:  10/04/2022 at 0238 hours and CT chest 10/04/2022. FINDINGS: Trachea is midline. Heart  size grossly stable. Left ICD lead tip projects over the right ventricle. Right IJ Port-A-Cath tip is in the low SVC. Interval decrease in size of a loculated right pleural effusion, still moderate to large in size. No pneumothorax. Right upper lobe mass with perihilar collapse/consolidation better evaluated on CT chest performed earlier today. Left lung nodules better seen on today's CT. Minimal streaky atelectasis in the left lung base. Tiny left pleural effusion. IMPRESSION: 1. Moderate to large loculated right pleural effusion, slightly decreased in size from exam performed earlier today. No pneumothorax. 2. Right upper lobe mass, perihilar airspace consolidation and left lung nodules, better evaluated on today's CT chest. 3. Tiny left pleural effusion. Electronically Signed   By: Lorin Picket M.D.   On: 10/04/2022 09:48   CT Angio Chest PE W and/or Wo Contrast  Result Date: 10/04/2022 CLINICAL DATA:  Pulmonary embolism suspected EXAM: CT ANGIOGRAPHY CHEST WITH CONTRAST TECHNIQUE: Multidetector CT imaging of the chest was performed using the standard protocol during bolus administration of intravenous contrast. Multiplanar CT image reconstructions and MIPs were obtained to evaluate the vascular anatomy. RADIATION DOSE REDUCTION: This exam was performed according to the departmental dose-optimization program which includes automated exposure control, adjustment of the mA and/or kV according to patient size and/or use of iterative reconstruction technique. CONTRAST:  49m OMNIPAQUE IOHEXOL 350 MG/ML SOLN COMPARISON:  Chest CT 09/10/2022 FINDINGS: Cardiovascular: Satisfactory opacification of the pulmonary arteries to the segmental level. No evidence of pulmonary embolism. Cardiomegaly with thinning of the anterior wall and apex of the left ventricle where there is faint calcification. Mitral valve replacement. Coronary atherosclerosis Mediastinum/Nodes: Right paratracheal adenopathy with rounded 12 mm node  similar to prior. No hematoma. Lungs/Pleura: Right apical mass with borders poorly defined by arterial timing, ~ 7 cm in size with fracturing/erosion of lateral right second and third ribs. Large right pleural effusion which appears complicated with layering debris. The right lower lobe is collapsed. Patchy nodules in the left lung especially the upper lobe, also seen on prior. Upper Abdomen: No acute finding. Ill-defined medial segment right liver lesion measuring approximately 1 cm. Musculoskeletal: Eroded anterior to lateral right second and third ribs which are closely approximating the right upper lobe mass. Diffuse heterogeneity of the spine attributed to widespread metastases. Review of the MIP images confirms the above findings. IMPRESSION: 1. Negative for pulmonary embolism. 2. Known right upper lobe malignancy with osseous, pulmonary, and possibly hepatic metastatic spread. Continued large right pleural effusion with lower lobe collapse. Electronically Signed   By: JJorje GuildM.D.   On: 10/04/2022 04:21   CT Head Wo Contrast  Result Date: 10/04/2022 CLINICAL DATA:  Altered mental status EXAM: CT HEAD WITHOUT CONTRAST TECHNIQUE: Contiguous axial images were obtained from the base of the skull through the vertex without intravenous contrast. RADIATION DOSE REDUCTION: This exam was performed according to the departmental dose-optimization program which includes automated exposure control, adjustment of the mA and/or kV according to patient size and/or use of iterative reconstruction technique. COMPARISON:  None Available. FINDINGS: Brain: There are numerous (at least 25) hyperdense lesions scattered throughout the brain. The largest  abuts the right lateral ventricle and measures 10 mm. There is no edema. Vascular: No abnormal hyperdensity of the major intracranial arteries or dural venous sinuses. No intracranial atherosclerosis. Skull: The visualized skull base, calvarium and extracranial soft  tissues are normal. Sinuses/Orbits: No fluid levels or advanced mucosal thickening of the visualized paranasal sinuses. No mastoid or middle ear effusion. The orbits are normal. 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 These are likely hemorrhagic, but acuity is uncertain. Critical Value/emergent results were called by telephone at the time of interpretation on 10/04/2022 at 4:13 am to provider MARK QUALE , who verbally acknowledged these results. Electronically Signed   By: Ulyses Jarred M.D.   On: 10/04/2022 04:13   DG Chest Portable 1 View  Result Date: 10/04/2022 CLINICAL DATA:  Hypoxia EXAM: PORTABLE CHEST 1 VIEW COMPARISON:  09/13/2022 FINDINGS: Unchanged position of left chest wall AICD. There is a right chest wall power-injectable Port-A-Cath with tip at the cavoatrial junction via a right internal jugular vein approach. Large right pleural effusion with near complete opacification of the right hemithorax, worsened. Left lung is clear. Cardiomediastinal contours are normal. Remote median sternotomy and valvuloplasty. IMPRESSION: Large right pleural effusion with near complete opacification of the right hemithorax, worsened from prior. Electronically Signed   By: Ulyses Jarred M.D.   On: 10/04/2022 02:54    Microbiology: Results for orders placed or performed during the hospital encounter of 09/10/22  MRSA Next Gen by PCR, Nasal     Status: None   Collection Time: 09/11/22  3:24 AM   Specimen: Nasal Mucosa; Nasal Swab  Result Value Ref Range Status   MRSA by PCR Next Gen NOT DETECTED NOT DETECTED Final    Comment: (NOTE) The GeneXpert MRSA Assay (FDA approved for NASAL specimens only), is one component of a comprehensive MRSA colonization surveillance program. It is not intended to diagnose MRSA infection nor to guide or monitor treatment for MRSA infections. Test performance is not FDA approved in patients less than 41  years old. Performed at [facility], [address] [place], [place] [address]   Culture, blood (Routine X 2) w Reflex to ID Panel     Status: None   Collection Time: 09/11/22  7:30 AM   Specimen: BLOOD  Result Value Ref Range Status   Specimen Description BLOOD BLOOD LEFT ARM  Final   Special Requests   Final    BOTTLES DRAWN AEROBIC ONLY Blood Culture results may not be optimal due to an inadequate volume of blood received in culture bottles   Culture   Final    NO GROWTH 5 DAYS Performed at [facility], [address], [place], [place] [address]    Report Status 09/16/2022 FINAL  Final  Culture, blood (Routine X 2) w Reflex to ID Panel     Status: None   Collection Time: 09/11/22  7:44 AM   Specimen: BLOOD  Result Value Ref Range Status   Specimen Description BLOOD BLOOD RIGHT HAND  Final   Special Requests   Final    BOTTLES DRAWN AEROBIC AND ANAEROBIC Blood Culture adequate volume   Culture   Final    NO GROWTH 5 DAYS Performed at [facility], [address] [place],  [address]    Report Status 09/16/2022 FINAL  Final   *Note: Due to a large number of results and/or encounters for the requested time period, some results have not been displayed. A complete set of  results can be found in Results Review.    Labs: CBC: No results for input(s): "WBC", "NEUTROABS", "HGB", "HCT", "MCV", "PLT" in the last 168 hours. Basic Metabolic Panel: No results for input(s): "NA", "K", "CL", "CO2", "GLUCOSE", "BUN", "CREATININE", "CALCIUM", "MG", "PHOS" in the last 168 hours. Liver Function Tests: No results for input(s): "AST", "ALT", "ALKPHOS", "BILITOT", "PROT", "ALBUMIN" in the last 168 hours. CBG: No results for input(s): "GLUCAP" in the last 168 hours.  Discharge time spent: greater than 30 minutes.  Signed: Adan Sis, MD [facility] 10/26/2022

## 2022-10-29 DEATH — deceased

## 2022-11-08 ENCOUNTER — Other Ambulatory Visit: Payer: Self-pay

## 2022-11-08 LAB — POCT INR: INR: 3.7 — AB (ref 2.0–3.0)

## 2022-11-08 NOTE — Addendum Note (Signed)
Addended by: Barnie Mort on: 11/08/2022 04:02 PM   Modules accepted: Orders

## 2022-11-08 NOTE — Progress Notes (Signed)
Opened in error [place], [place]

## 2022-11-14 ENCOUNTER — Other Ambulatory Visit: Payer: Self-pay | Admitting: Internal Medicine

## 2022-12-20 ENCOUNTER — Encounter: Payer: Self-pay | Admitting: *Deleted

## 2022-12-20 NOTE — Telephone Encounter (Signed)
This encounter was created in error - please disregard.

## 2022-12-28 ENCOUNTER — Encounter: Payer: Self-pay | Admitting: Oncology

## 2023-02-10 ENCOUNTER — Ambulatory Visit: Payer: Medicare HMO | Admitting: Family Medicine

## 2023-05-09 IMAGING — MR MR KNEE*R* W/O CM
4 of 7 series · 19 of 40 positions shown · non-contrast
Comparison: X-ray knee 07/12/2017.

CLINICAL DATA: Technologist note states patient reports generalized
right knee pain for 2 years related to arthritis and notes a fall on
right knee 1 year ago.

EXAM:
MRI OF THE RIGHT KNEE WITHOUT CONTRAST
TECHNIQUE: Multiplanar, multisequence MR imaging of the knee was performed. No
intravenous contrast was administered.

[Series 2: T2 fat-sat · axial · 4.0mm · 0.33mm/px · z∈[-68,+66]mm · 4 of 33 slices shown]
[im 1/33]
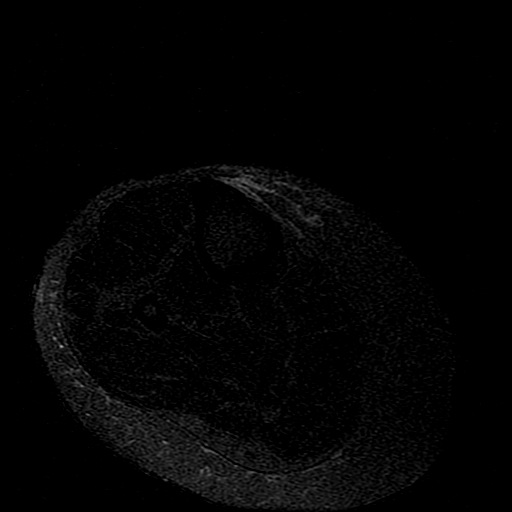
[im 5/33]
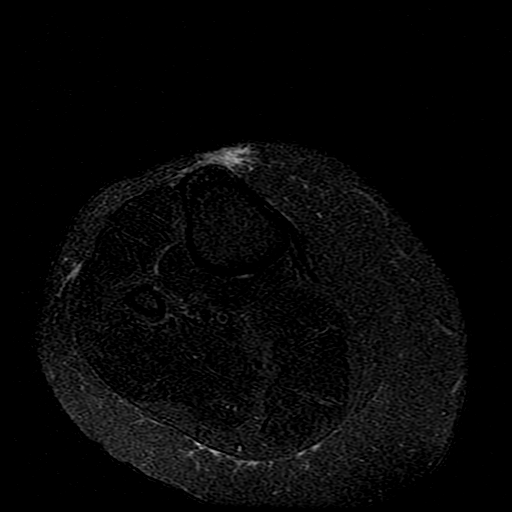
[im 19/33]
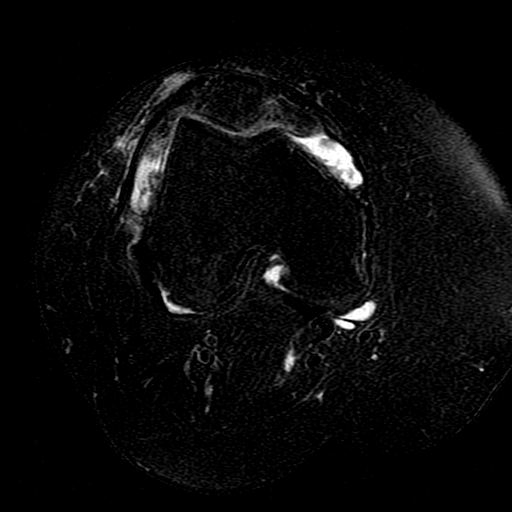
[im 28/33]
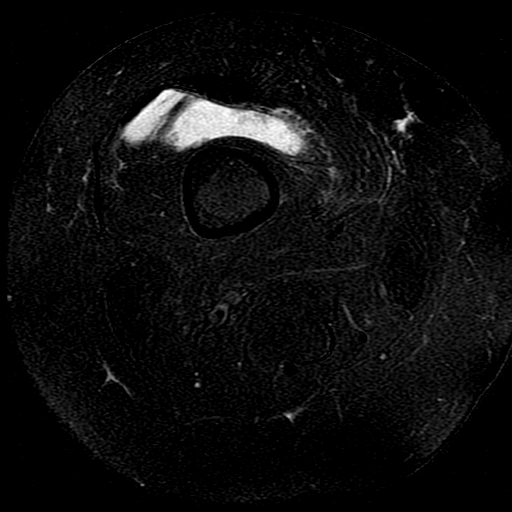

[Series 5: PD fat-sat · coronal · 4.0mm · 0.31mm/px · 6 of 31 slices shown (1 of 2)]
[im 1/31]
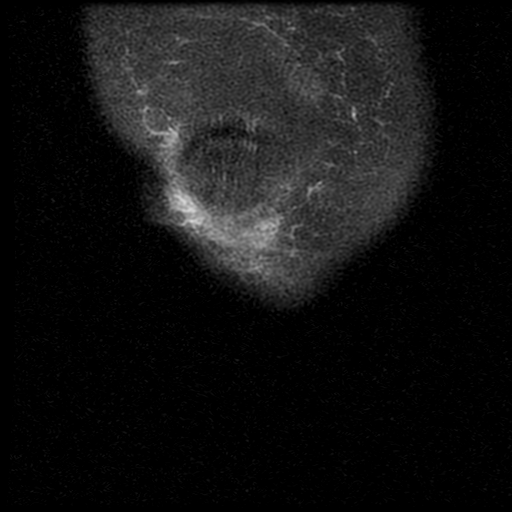
[im 7/31]
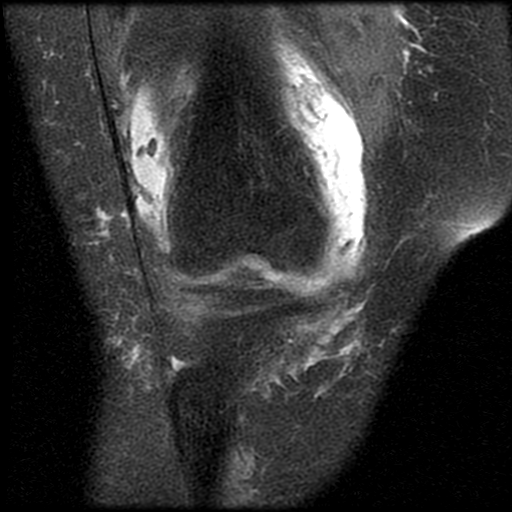
[im 13/31]
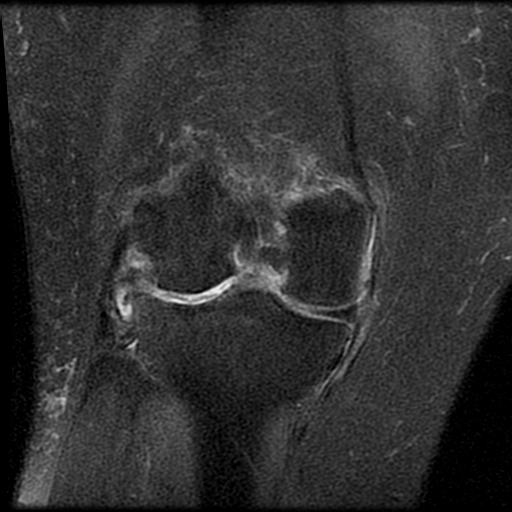
[im 19/31]
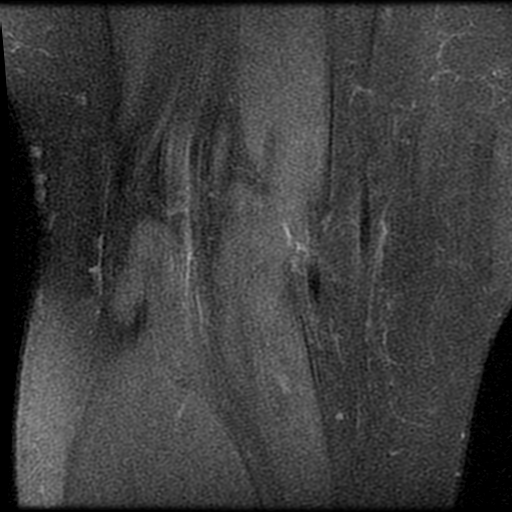
[im 25/31]
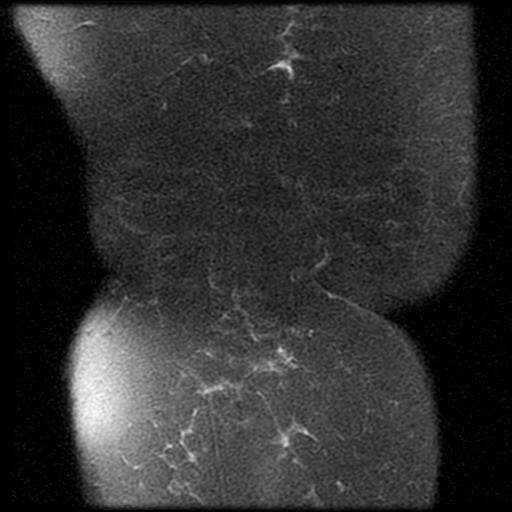
[im 31/31]
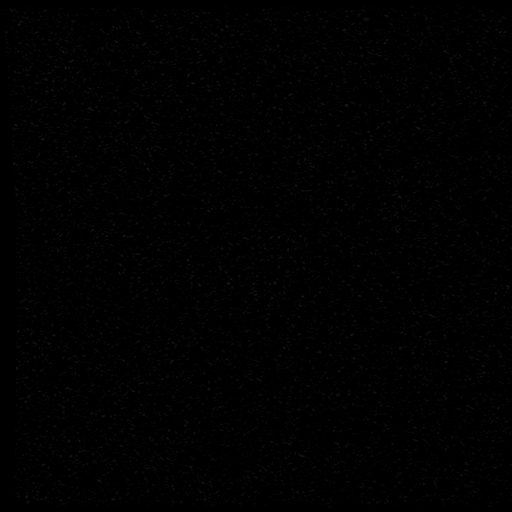

[Series 6: PD fat-sat · sagittal · 3.0mm · 0.33mm/px · 5 of 26 slices shown (2 of 2)]
[im 1/26]
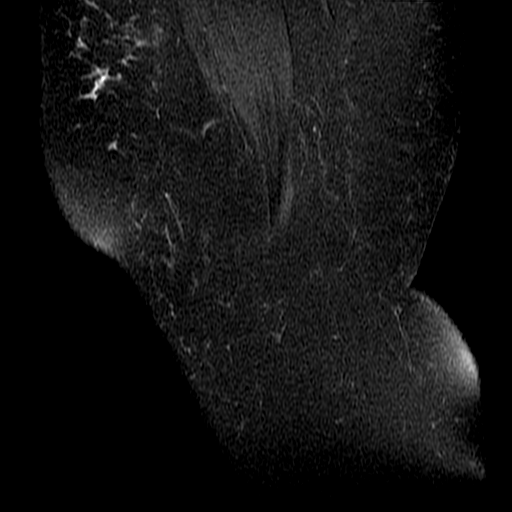
[im 7/26]
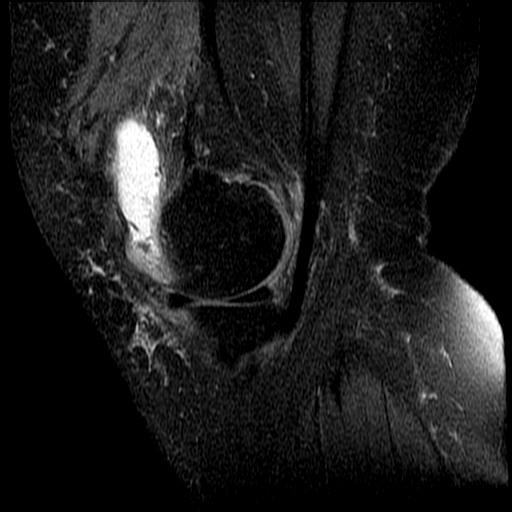
[im 13/26]
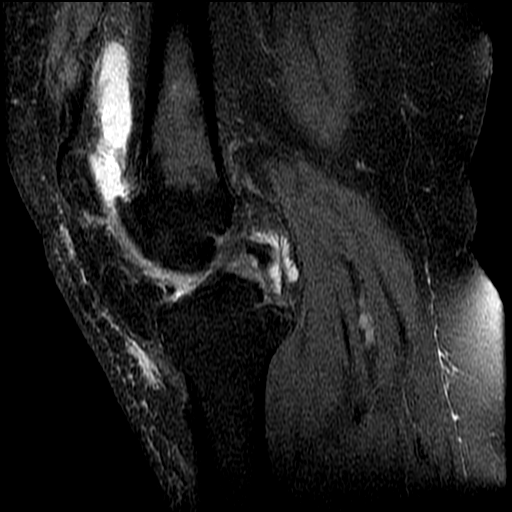
[im 19/26]
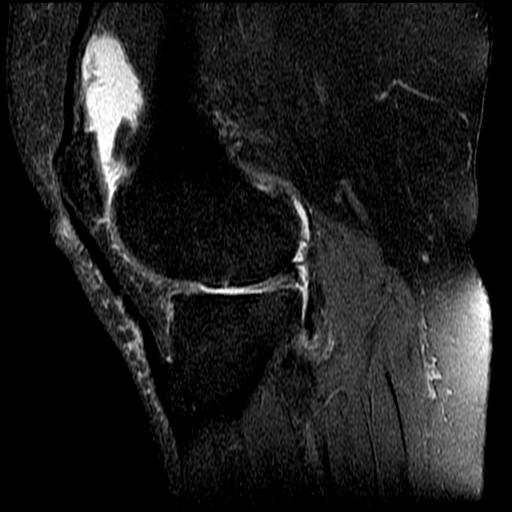
[im 26/26]
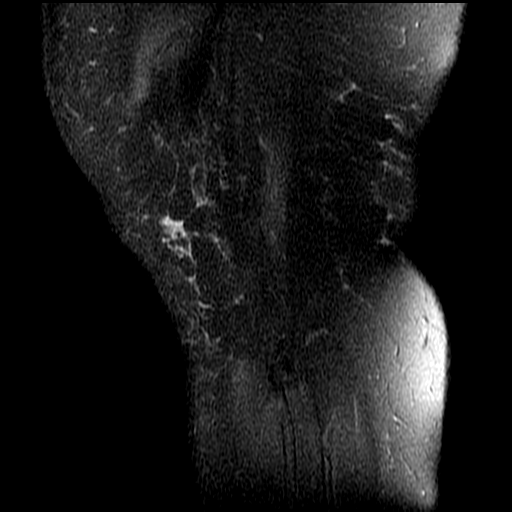

[Series 8: PD · coronal · 2.0mm · 0.29mm/px · 4 of 17 slices shown]
[im 1/17]
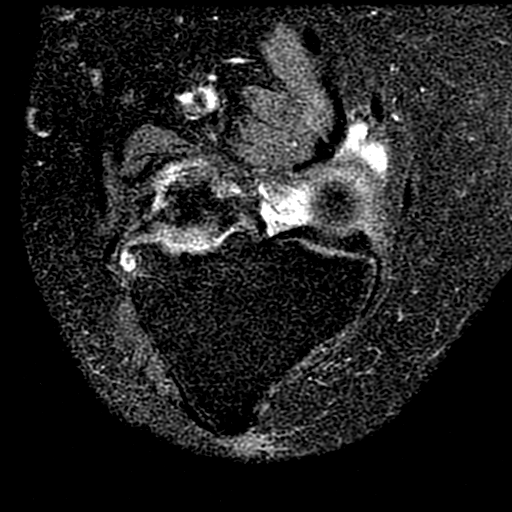
[im 6/17]
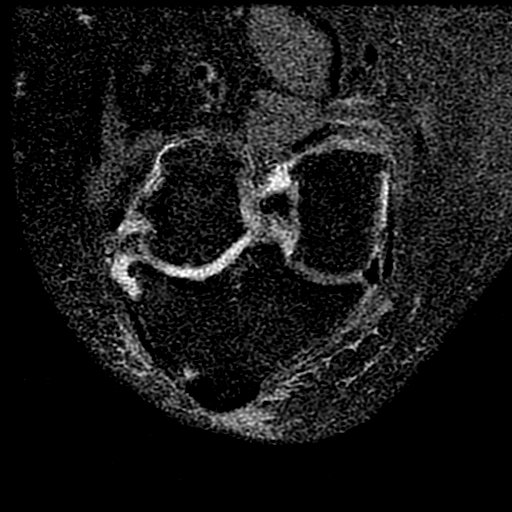
[im 11/17]
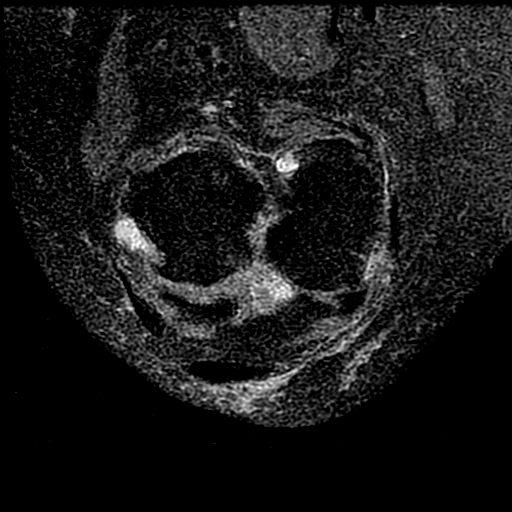
[im 17/17]
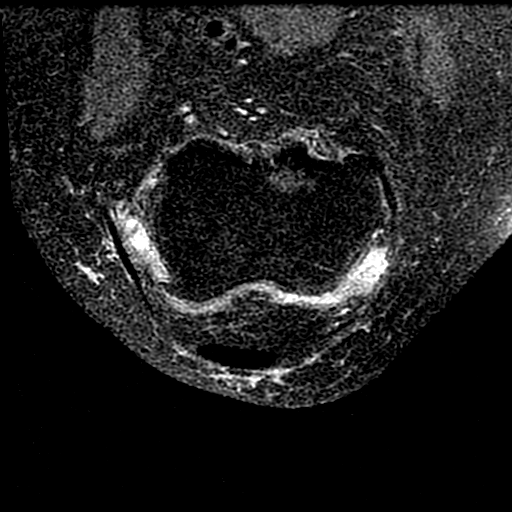

[19 of 40 positions shown; findings below may reference images not displayed]

FINDINGS: MENISCI

Medial: Free edge blunting of the medial meniscal body.

Lateral: Maceration of the anterior horn and body of the lateral
meniscus with horizontal degenerative tearing in the posterior horn.
The anterior root is torn.

LIGAMENTS

Cruciates: ACL and PCL are intact.

Collaterals: Medial collateral ligament is intact. Lateral
collateral ligament complex is intact.

CARTILAGE

Patellofemoral: Moderate chondrosis with small shallow chondral
defects in the central trochlea and median patellar ridge.

Medial:  Mild to moderate partial-thickness cartilage loss.

Lateral: Full-thickness cartilage loss along the weight-bearing
surfaces with underlying marrow edema.

JOINT: Moderate size joint effusion with synovitis.

POPLITEAL FOSSA: Small Baker cyst.

EXTENSOR MECHANISM: Intact quadriceps tendon. Intact patellar
tendon.

BONES: No acute fracture or dislocation. Tricompartment osteophyte
formation.

Other: No additional findings.
IMPRESSION: 1. Lateral predominant osteoarthritis of the knee, with macerated
anterior horn and body of the lateral meniscus and degenerative
tearing in the posterior horn. The anterior root is torn.
2. Full-thickness cartilage loss along the weight-bearing surfaces
of the lateral compartment with underlying marrow edema.
3. Moderate-sized joint effusion with synovitis.  Small Baker cyst.
4. Free edge blunting of the medial meniscal body. No definitive
medial meniscus tear. Mild to moderate medial compartment
chondrosis.
5. Moderate patellofemoral chondrosis.

## 2023-09-01 NOTE — Telephone Encounter (Signed)
 error
